# Patient Record
Sex: Male | Born: 1940 | Race: White | Hispanic: No | Marital: Married | Smoking: Former smoker
Health system: Southern US, Community
[De-identification: ages and names within clinical notes are randomized; demographics above are authoritative.]

## PROBLEM LIST (undated history)

## (undated) DIAGNOSIS — I739 Peripheral vascular disease, unspecified: Secondary | ICD-10-CM

## (undated) DIAGNOSIS — I10 Essential (primary) hypertension: Secondary | ICD-10-CM

## (undated) DIAGNOSIS — N183 Chronic kidney disease, stage 3 unspecified: Secondary | ICD-10-CM

## (undated) DIAGNOSIS — I7409 Other arterial embolism and thrombosis of abdominal aorta: Secondary | ICD-10-CM

## (undated) DIAGNOSIS — D649 Anemia, unspecified: Secondary | ICD-10-CM

## (undated) DIAGNOSIS — Z720 Tobacco use: Secondary | ICD-10-CM

## (undated) DIAGNOSIS — I119 Hypertensive heart disease without heart failure: Secondary | ICD-10-CM

## (undated) DIAGNOSIS — Z9119 Patient's noncompliance with other medical treatment and regimen: Secondary | ICD-10-CM

## (undated) DIAGNOSIS — I340 Nonrheumatic mitral (valve) insufficiency: Secondary | ICD-10-CM

## (undated) DIAGNOSIS — I639 Cerebral infarction, unspecified: Secondary | ICD-10-CM

## (undated) DIAGNOSIS — Z91199 Patient's noncompliance with other medical treatment and regimen due to unspecified reason: Secondary | ICD-10-CM

## (undated) DIAGNOSIS — E785 Hyperlipidemia, unspecified: Secondary | ICD-10-CM

## (undated) DIAGNOSIS — N529 Male erectile dysfunction, unspecified: Secondary | ICD-10-CM

## (undated) DIAGNOSIS — J449 Chronic obstructive pulmonary disease, unspecified: Secondary | ICD-10-CM

## (undated) DIAGNOSIS — K219 Gastro-esophageal reflux disease without esophagitis: Secondary | ICD-10-CM

## (undated) DIAGNOSIS — K922 Gastrointestinal hemorrhage, unspecified: Secondary | ICD-10-CM

## (undated) DIAGNOSIS — I5042 Chronic combined systolic (congestive) and diastolic (congestive) heart failure: Secondary | ICD-10-CM

## (undated) DIAGNOSIS — I779 Disorder of arteries and arterioles, unspecified: Secondary | ICD-10-CM

## (undated) DIAGNOSIS — I4892 Unspecified atrial flutter: Secondary | ICD-10-CM

## (undated) DIAGNOSIS — I2510 Atherosclerotic heart disease of native coronary artery without angina pectoris: Secondary | ICD-10-CM

## (undated) DIAGNOSIS — M109 Gout, unspecified: Secondary | ICD-10-CM

## (undated) DIAGNOSIS — I2699 Other pulmonary embolism without acute cor pulmonale: Secondary | ICD-10-CM

## (undated) DIAGNOSIS — I480 Paroxysmal atrial fibrillation: Secondary | ICD-10-CM

## (undated) HISTORY — DX: Other arterial embolism and thrombosis of abdominal aorta: I74.09

## (undated) HISTORY — DX: Male erectile dysfunction, unspecified: N52.9

## (undated) HISTORY — PX: PELVIC FRACTURE SURGERY: SHX119

## (undated) HISTORY — DX: Tobacco use: Z72.0

## (undated) HISTORY — DX: Peripheral vascular disease, unspecified: I73.9

## (undated) HISTORY — DX: Cerebral infarction, unspecified: I63.9

## (undated) HISTORY — PX: EYE SURGERY: SHX253

## (undated) HISTORY — DX: Anemia, unspecified: D64.9

## (undated) HISTORY — PX: SPINAL FUSION: SHX223

## (undated) HISTORY — DX: Paroxysmal atrial fibrillation: I48.0

## (undated) HISTORY — DX: Unspecified atrial flutter: I48.92

## (undated) HISTORY — DX: Essential (primary) hypertension: I10

## (undated) HISTORY — DX: Patient's noncompliance with other medical treatment and regimen: Z91.19

## (undated) HISTORY — PX: HAND SURGERY: SHX662

## (undated) HISTORY — DX: Disorder of arteries and arterioles, unspecified: I77.9

## (undated) HISTORY — DX: Patient's noncompliance with other medical treatment and regimen due to unspecified reason: Z91.199

## (undated) HISTORY — DX: Nonrheumatic mitral (valve) insufficiency: I34.0

## (undated) HISTORY — DX: Chronic combined systolic (congestive) and diastolic (congestive) heart failure: I50.42

## (undated) HISTORY — DX: Hyperlipidemia, unspecified: E78.5

## (undated) HISTORY — DX: Gastrointestinal hemorrhage, unspecified: K92.2

## (undated) HISTORY — DX: Chronic kidney disease, stage 3 (moderate): N18.3

## (undated) HISTORY — DX: Atherosclerotic heart disease of native coronary artery without angina pectoris: I25.10

## (undated) HISTORY — DX: Chronic obstructive pulmonary disease, unspecified: J44.9

## (undated) HISTORY — DX: Hypertensive heart disease without heart failure: I11.9

## (undated) HISTORY — DX: Chronic kidney disease, stage 3 unspecified: N18.30

---

## 1973-05-12 HISTORY — PX: OTHER SURGICAL HISTORY: SHX169

## 1999-01-17 ENCOUNTER — Encounter: Payer: Self-pay | Admitting: Interventional Cardiology

## 1999-01-17 ENCOUNTER — Inpatient Hospital Stay (HOSPITAL_COMMUNITY): Admission: EM | Admit: 1999-01-17 | Discharge: 1999-01-19 | Payer: Self-pay | Admitting: *Deleted

## 1999-02-05 ENCOUNTER — Encounter (HOSPITAL_COMMUNITY): Admission: RE | Admit: 1999-02-05 | Discharge: 1999-05-06 | Payer: Self-pay | Admitting: Interventional Cardiology

## 1999-03-19 ENCOUNTER — Ambulatory Visit (HOSPITAL_COMMUNITY): Admission: RE | Admit: 1999-03-19 | Discharge: 1999-03-19 | Payer: Self-pay | Admitting: *Deleted

## 1999-03-19 ENCOUNTER — Encounter (INDEPENDENT_AMBULATORY_CARE_PROVIDER_SITE_OTHER): Payer: Self-pay | Admitting: Specialist

## 1999-11-23 ENCOUNTER — Inpatient Hospital Stay (HOSPITAL_COMMUNITY): Admission: EM | Admit: 1999-11-23 | Discharge: 1999-11-24 | Payer: Self-pay | Admitting: Emergency Medicine

## 1999-11-23 ENCOUNTER — Encounter: Payer: Self-pay | Admitting: Interventional Cardiology

## 2000-02-18 ENCOUNTER — Ambulatory Visit (HOSPITAL_COMMUNITY): Admission: RE | Admit: 2000-02-18 | Discharge: 2000-02-18 | Payer: Self-pay | Admitting: Family Medicine

## 2000-12-08 ENCOUNTER — Encounter: Payer: Self-pay | Admitting: Family Medicine

## 2000-12-08 ENCOUNTER — Encounter: Admission: RE | Admit: 2000-12-08 | Discharge: 2000-12-08 | Payer: Self-pay | Admitting: Family Medicine

## 2003-04-13 ENCOUNTER — Ambulatory Visit (HOSPITAL_COMMUNITY): Admission: RE | Admit: 2003-04-13 | Discharge: 2003-04-13 | Payer: Self-pay | Admitting: Interventional Cardiology

## 2003-10-30 ENCOUNTER — Ambulatory Visit (HOSPITAL_COMMUNITY): Admission: RE | Admit: 2003-10-30 | Discharge: 2003-10-30 | Payer: Self-pay | Admitting: Orthopedic Surgery

## 2005-09-17 ENCOUNTER — Ambulatory Visit (HOSPITAL_COMMUNITY): Admission: RE | Admit: 2005-09-17 | Discharge: 2005-09-17 | Payer: Self-pay | Admitting: Interventional Cardiology

## 2005-09-29 ENCOUNTER — Ambulatory Visit (HOSPITAL_COMMUNITY): Admission: RE | Admit: 2005-09-29 | Discharge: 2005-09-30 | Payer: Self-pay | Admitting: Interventional Cardiology

## 2009-08-23 ENCOUNTER — Ambulatory Visit (HOSPITAL_COMMUNITY): Admission: RE | Admit: 2009-08-23 | Discharge: 2009-08-23 | Payer: Self-pay | Admitting: Interventional Cardiology

## 2010-09-27 NOTE — Procedures (Signed)
NAMEAUGUSTO, DECKMAN NO.:  [id number]   MEDICAL RECORD NO.:  [id number]          PATIENT TYPE:  OIB   LOCATION:  2899                         FACILITY:  [facility]   PHYSICIAN:  Corky Crafts, MDDATE OF BIRTH:  Jan 27, 1941   DATE OF PROCEDURE:  08/23/2009  DATE OF DISCHARGE:  08/23/2009                    PERIPHERAL VASCULAR INVASIVE PROCEDURE   PRIMARY CARDIOLOGIST:  Dr. Verdis Prime.   PROCEDURES PERFORMED:  Abdominal aortogram, pelvic angiogram, bilateral  selective renal angiogram.   OPERATOR:  Dr. Eldridge Dace.   INDICATIONS:  Claudication.   PROCEDURE NARRATIVE:  The risks and benefits of peripheral vascular  angiography were explained to the patient and informed consent was  obtained.  He was brought to the catheterization laboratory.  He was  prepped and draped in the usual sterile fashion.  His right groin was  infiltrated with 1% lidocaine.  A 5-French sheath was placed in theright femoral artery using a modified Seldinger technique.  A pigtail  catheter was advanced into the abdominal aorta and a power injection of  contrast was performed in the AP projection.  The catheter was then  pulled back to the aortoiliac bifurcation and power injection of  contrast was performed to image the pelvic vasculature.  A bilateral  lower extremity runoff was performed in a bolus-chase fashion using a  pigtail catheter.  The left SFA was not well-seen on this picture.  A  crossover catheter was used to get a Wholey wire into the left iliac  system.  A 5-French end-hole catheter was then advanced into the right  external iliac artery.  Power injection of contrast was performed  through the catheter in the iliac to image the left lower extremity.  The sheath was removed using manual compression.   FINDINGS:  The abdominal aorta had mild atherosclerosis.  There was a  small infrarenal aneurysm.  The right common iliac stent was widely  patent.  The right internal  iliac artery was occluded, this vessel  filled by collaterals from the inferior mesenteric artery.  The right  external iliac artery was patent.  The left common/external iliac stents  were patent.  The left internal iliac artery was occluded with  collaterals from the inferior mesenteric artery.  The left external  iliac artery was patent.   Right leg:  The right common femoral artery, common profunda femoral  artery were both patent.  The right SFA had mild atherosclerosis.  There  was a moderate focal area of atherosclerosis of the mid SFA, but this  did not appear to be hemodynamically significant.  The right popliteal  artery was patent below the knee, the right anterior tibial artery was  patent.  The right tibial peroneal trunk had an 80% stenosis, the right  peroneal artery had a 95% long stenosis, the right posterior tibial  artery was a small vessel, but widely patent.   Left lower extremity:  The left common femoral artery was widely patent.  The left SFA was occluded just after the origin.  The left profunda  femoral artery was widely patent.  The SFA reconstitutes in the mid SFA  from collaterals from  the profunda femoral artery.  The left popliteal  artery is widely patent.  The left anterior tibial artery is patent.  The left tibial peroneal trunk had a 30% distal stenosis.  The left  peroneal and PT arteries are widely patent.   Bilateral selective renal angiography:  A JR-4 catheter was used.  The  left kidney was supplied by a single left renal artery, which was widely  patent.  The right kidney was supplied by a single right renal artery  which had a focal 40% to 50% stenosis, which appeared mildly hazy.   The selective shot to the left SFA showed where the left SFA  reconstitutes in the mid vessel.   IMPRESSION:  1. Occluded left superficial femoral artery just past the origin.      This vessel reconstitutes in the mid superficial femoral artery      with  collaterals from the profunda femoral artery.  2. Significant disease in the right tibial peroneal trunk and right      peroneal artery.  3. Three-vessel runoff below the left knee.   RECOMMENDATIONS:  We will continue to intensify medical therapy given  that this patient does not have rest pain or any lower extremity ulcers.  I would encourage walking to, hopefully, aid in building collateral  circulation.  If his symptoms become refractory to medical therapy, we  can reconsider bringing him back for attempted angioplasty.  Another  option would be to consider bypass in that left thigh.  Findings were  discussed with the patient.  He should also stop smoking and continue  aggressive secondary prevention including blood pressure control, lipid-  lowering therapy.      Corky Crafts, MD     JSV/MEDQ  D:  09/03/2009  T:  09/03/2009  Job:  [id number]   Electronically Signed by Lance Muss MD on 09/14/2009 10:35:06 AM

## 2010-09-27 NOTE — H&P (Signed)
[place]. [facility]  Patient:    Nathan [name], Nathan [name]                      MRN: [id number] Adm. Date:  11/23/99 Attending:  Peter M. Swazil[place], M.D. CC:         Darci Needle, M.D.             Roosvelt Harps, M.D.             Jethro Bastos, M.D.                         History and Physical  CHIEF COMPLAINT:  Palpitations.  HISTORY OF PRESENT ILLNESS:  Mr. Corsino is a 70 year old white male with history of coronary artery disease, prior myocardial infarctions, tobacco and alcohol abuse, who presents with acute onset of palpitations and heart pounding this afternoon.  It is associated with a sharp left precordial pain that was described as mild to moderate and was felt to be different than his heart attack pain.  He states he had had some similar palpitations three to four weeks ago.  In the emergency room, he was found to be in atrial fibrillation with rapid ventricular response and rate of 160.  He denied any shortness of breath and continues to smoke 1-1/2 packs per day and drinks alcohol eight beers per day.  He denies any recent binging.  Patient had a subacute inferior myocardial infarction in September 2000.  Cardiac catheterization showed occlusion of the small distal left circumflex.  He was treated medically and apparently had a follow-up stress test earlier this year with Dr. Katrinka Blazing.  Ejection fraction by catheterization was 50%.  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 2. Hypercholesterolemia. 3. Hypertension. 4. Tobacco abuse. 5. Alcohol abuse. 6. Lymphocytic colitis. 7. Previous hand surgery. 8. Previous lumbar fusion.  ALLERGIES:  No known drug allergies.  MEDICATIONS: 1. Prednisone 10 mg every three days. 2. Toprol XL 50 mg per day. 3. Asacol 400 mg two tablets t.i.d. after meals. 4. Diazepam 2 mg p.r.n. 5. Prevacid 30 mg per day. 6. Lipitor 10 mg per day.  SOCIAL HISTORY:  Patient is married.  He smokes  1-1/2 packs per day and drinks eight beers per day.  FAMILY HISTORY:  Negative for coronary disease.  Positive for stroke and cancer.  REVIEW OF SYSTEMS:  Positive for right leg claudication otherwise as per HPI.  PHYSICAL EXAMINATION:  GENERAL:  Patient is a well-developed white male in no distress.  VITAL SIGNS:  Pulse is 150 and irregular.  Blood pressure is 140/72. Respirations are 20.  He is afebrile.  HEENT:  Unremarkable.  Pupils equal, round, and reactive.  Conjunctivae are clear.  Funduscopic exam is benign.  Oropharynx is clear.  NECK:  Supple, without JVD, adenopathy, thyromegaly or bruits.  LUNGS:  Clear to auscultation and percussion.  CARDIAC:  Regular rate and rhythm, normal S1 and S2, without gallops, murmurs, or clicks.  ABDOMEN:  Soft and nontender.  There is no hepatosplenomegaly, masses or bruits.  EXTREMITIES:  Femoral and pedal pulses are palpable.  He has no edema.  NEUROLOGIC:  Intact.  LABORATORY DATA:  CHEST X-RAY:  No active disease.  Sodium is 140, potassium 3.5, chloride 107, CO2 of 23, BUN 15, creatinine 1.0, glucose of 98.  Hemoglobin 14.  ECG:  Atrial fibrillation with rapid ventricular response.  There is old inferior myocardial infarction.  There  is mild ST depression anteriorly.  IMPRESSION: 1. New onset atrial fibrillation with rapid ventricular response. 2. Atherosclerotic coronary artery disease with prior inferior myocardial    infarction due to distal circumflex occlusion. 3. Hypertension. 4. Hypercholesterolemia. 5. Lymphocytic colitis. 6. Alcohol abuse. 7. Tobacco abuse. 8. Hypokalemia.  PLAN:  We will admit to telemetry, rule out myocardial infarction, replete his potassium, and we will add IV Cardizem for rate control and IV heparin, continue his other cardiac medications, obtain an echocardiogram on Monday, obtain routine lab work including thyroid studies. DD:  11/23/99 TD:  11/23/99 Job: [id number]

## 2010-09-27 NOTE — Op Note (Signed)
NAME:  ARLO, BUTT NO.:  [id number]   MEDICAL RECORD NO.:  [id number]          PATIENT TYPE:  OIB   LOCATION:  6529                         FACILITY:  [facility]   PHYSICIAN:  Corky Crafts, MDDATE OF BIRTH:  05-07-1941   DATE OF PROCEDURE:  09/29/2005  DATE OF DISCHARGE:  09/30/2005                                 OPERATIVE REPORT   REFERRING PHYSICIAN:  Verdis Prime, MD   PROCEDURES PERFORMED:  1.  Abdominal aortogram.  2.  Pelvic angiogram.  3.  Bilateral lower extremity runoffs.  4.  Bilateral iliac percutaneous transluminal angioplasty/stent placement.   INDICATIONS:  Claudication.   OPERATOR:  Corky Crafts, MD   PROCEDURAL NARRATIVE:  The risks and benefits of peripheral angiography were  explained to the patient and informed consent was obtained.  The patient was  brought to the peripheral vascular lab and placed on the table.  He was  prepped and draped in the usual sterile fashion.  His right groin was  infiltrated with 1% lidocaine.  A 5-French arterial sheath was placed into  the right femoral artery using the modified Seldinger technique.  Initially,  a Wholey wire was used to gain access; however, it could not cross the  lesion in the right iliac.  A Glidewire was then used to cross the lesion in  the iliac.  A pigtail catheter was advanced to the abdominal aorta and a  power injection of contrast was used to perform the abdominal aortogram.  The catheter was pulled back to the aortoiliac bifurcation and a power  injection of contrast was used.  Then bilateral lower extremity runoffs were  performed using a power injection of contrast through the pigtail catheter.  The PTA/stents were placed, please see below for those details.  Heparin was  used for anticoagulation during the procedure.  The sheath will be removed  using manual compression.   FINDINGS:  1.  There is an abdominal aortic aneurysm below the renal arteries which was     2.3 cm in diameter.  2.  Widely patent renal arteries.  3.  Diffusely diseased right common iliac, which was to 90% in the distal      portion.  4.  Eighty percent left distal common iliac lesion.  5.  Internal iliacs occluded bilaterally.  This territory is collateralized      by the inferior mesenteric artery.  6.  Widely patent right and left common femoral arteries.  7.  The right SFA has luminal irregularities up to 40%.  The left SFA is      diffusely diseased with up to 90% stenosis.  There is some collateral      flow from the left profunda.  8.  There is a 90% lesion in the right TP trunk.  There is three-vessel      runoff in the left leg.  There is three-vessel runoff with mild diffuse      disease.   PERCUTANEOUS TRANSLUMINAL ANGIOPLASTY NARRATIVE:  After diagnostic  angiography was performed, the 5-French sheath was removed and a 7-French  sheath was then advanced into  the right femoral artery.  A 5 0 x 20-mm  Powerflex balloon was then advanced to the lesion in the right distal common  iliac.  The balloon was inflated to 10 atmospheres for 21 seconds.  This  angioplasty allowed the sheath to be advanced to the left common iliac.  The  5.0 x 20-mm Powerflex Plus balloon was then inflated to 10 atmospheres for  16 seconds after it was placed across the lesion.  The lesion in the left  common iliac had a residual dissection after the initial angioplasty.  A 9.0  x 60-mm Smart stent was then deployed.  This stent was postdilated with a  7.0 x 40-mm Powerflex balloon which was inflated to 8 atmospheres for 21  seconds.  The distal portion of the stent was inflated to 3 atmospheres for  10 seconds.  There was an excellent angiographic result.  The sheath was  then withdrawn into the right internal iliac.  Angiography revealed mobile  atheroma noted in the proximal common iliac artery.  An 8.0 x 60-mm Smart  stent was then deployed in the right iliac, covering both the lesion  which  had been angioplastied previously as well as the mobile atheroma noted in  the proximal common iliac.  The stent was then postdilated with a 7.0 x 40-  mm balloon, which was initially inflated to 8 atmospheres for 17 seconds.  The distal portion of the stent was then postdilated with balloon inflation  of 6 atmospheres for 16 seconds.   IMPRESSIONS:  1.  Successful bilateral iliac stent placement with self-expanding stents in      both common iliac arteries.  2.  Significant left superficial femoral artery disease.  3.  Small infrarenal abdominal aortic aneurysm.   RECOMMENDATIONS:  1.  The patient will be observed overnight.  He will continue aspirin 325 mg      daily and Plavix 75 mg daily for at least a month.  2.  I will continue to encourage him to exercise as well as stop smoking.      The patient will follow up with Dr. Katrinka Blazing.      Corky Crafts, MD  Electronically Signed     JSV/MEDQ  D:  09/29/2005  T:  09/30/2005  Job:  [id number]

## 2011-07-11 ENCOUNTER — Other Ambulatory Visit: Payer: Self-pay | Admitting: Interventional Cardiology

## 2011-07-11 DIAGNOSIS — I739 Peripheral vascular disease, unspecified: Secondary | ICD-10-CM

## 2011-07-15 ENCOUNTER — Ambulatory Visit
Admission: RE | Admit: 2011-07-15 | Discharge: 2011-07-15 | Disposition: A | Discharge status: Home / Self Care | Payer: Medicare PPO | Source: Ambulatory Visit | Attending: Interventional Cardiology | Admitting: Interventional Cardiology

## 2011-07-15 DIAGNOSIS — I739 Peripheral vascular disease, unspecified: Secondary | ICD-10-CM

## 2011-07-15 MED ORDER — IOHEXOL 350 MG/ML SOLN
100.0000 mL | Freq: Once | INTRAVENOUS | Status: AC | PRN
  Administered 2011-07-15: 100 mL via INTRAVENOUS

## 2011-09-01 ENCOUNTER — Encounter: Payer: Medicare PPO | Admitting: Surgery

## 2011-09-03 ENCOUNTER — Other Ambulatory Visit: Payer: Self-pay | Admitting: Interventional Cardiology

## 2011-09-03 ENCOUNTER — Encounter (HOSPITAL_COMMUNITY): Payer: Self-pay | Admitting: Pharmacy Technician

## 2011-09-03 ENCOUNTER — Other Ambulatory Visit: Payer: Self-pay | Admitting: Cardiology

## 2011-09-03 NOTE — H&P (Signed)
PRE CATH WORK UP/RADIAL/HS/794.30/LABS/CHEST XRAY/SEE LINDA. 2. Patient will call back with his list of medications, his wife handles them for him.Marland Kitchen      HPI:     General:           Nathan [name] is a 71 yo male followed by Dr Katrinka Blazing and Dr Eldridge Dace with a hx of CAD, PVD with stents to iliac in 2007, and Hypertension. He was recently seen to due his PAD with CTA of the aortia and runoff that was abnormal and referred to Dr Myra Gianotti with appointment 09/08/11, to consider fem popliteal bypass surgery. He has chronic occulsion of the left SFA and patent iliac stents.          However he had nuclear stress test 3/13 wt the [place] with noted abnormality with small reversible defect of the apex and anterior wall and large fixed defect of the RCA and lateral wall with global hypokinesis. Dr Katrinka Blazing wants to proceed with radial cath at main lab 09/04/11 prior to PAD surgerical clearance. He states he has rare and limited chest pain, last occurred 2 months ago. Patient will have wife call back in am and confirm that med list is correct, since she fixes pill boxes..         Patient denies chest pain, palpitations, dizziness, syncope, swelling, nor PND. He has SOB with walking, had mild SOB with walking in office. + claudication with walking, resolves with rest then able to walk further, no rest pain,.     ROS:      as noted in HPI, no recent cough, congestion nor wheezing. no back problems, no GI complanints, no fever, chills, smokes 1- 1 1/2 ppd > 62 yrs. he [facility] but was unable to sleep and breathing test. no headache, back pain or problems lying flat.     Medical History: Bilateral iliac disease with stent implantations 2007, Coronary artery disease with the loop to the circumflex 2000, inferolateral infarction., Hypertension, History of paroxysmal atrial flutter ablation, Erectile disfunction, COPD, Hyperlipidemia.      Surgical History: spinal fusion , hand surgery .      Hospitalization/Major Diagnostic Procedure: Not in the past year 10/2009-10/2010.      Family History:   Negative GI family history.     Social History:      General: Smoking: yes, 1/2-1 pk/day. no Alcohol. no Recreational drug use.      Medications: Aspirin 325 mg 325 mg tablet one tab daily, Simvastatin 10 mg Tablet 1 tablet every evening once a day---[facility], Omeprazole 20 mg tablet one tablet once a day, Budeprion SR 150 MG Tablet Extended Release 12 Hour 1 tablet Twice a day, Allopurinol 300 MG Tablet 1 tablet Once a day, Colcrys 0.6 MG Tablet as directed , Fish Oil ? dose Capsule 1 capsule with a meal twice a day, OTC stress response 2 tablets daily as directed , Nitroglycerin 0.4 MG Tablet Sublingual 1 tablet under the tongue as directed as needed, Pletal 100 MG Tablet 1 tablet 30 minutes before or 2 hours after breakfast and dinner twice a day fir pain in lgs     Allergies: N.K.D.A.     Objective:    Vitals: Wt 204, Wt change -3.6 lb, Ht 74, BMI 26.19, Pulse sitting 64, BP sitting 122/68.     Examination:     Cardiology Exam:         GENERAL APPEARANCE: pleasant, NAD, .  HEENT: normal.  CAROTID UPSTROKE: bilateral carotid bruits, R>L.  JVD: flat.  HEART: regular rate and rhythm, normal S1S2, no murmur, no rub, no gallop, or click.  LUNGS: clear to auscultation, no wheezing/rhonchi/rales.  ABDOMEN: soft, non-tender, + bowel sounds.  EXTREMITIES: no leg edema.  PERIPHERAL PULSES: decreased pedal pulses. extremities cool to touch .  NEUROLOGIC: grossly intact.  MOOD: normal.            Assessment:    Assessment:  1. Coronary atherosclerosis of native coronary artery - 414.01 (Primary)   2. Atherosclerosis of native arteries of the extremities with intermittent claudication - 440.21   3. Hypertension, essential - 401.1   4. Abnormal nuclear cardiac imaging test - 794.39, plans to proceed with cardiac cath on 09/04/11,      Plan:    1. Coronary atherosclerosis of native coronary  artery  Continue Aspirin 325 mg tablet, 325 mg, one tab, orally, daily ;  Continue Simvastatin Tablet, 10 mg, 1 tablet every evening, Orally, once a day---[facility] ;  Continue Nitroglycerin Tablet Sublingual, 0.4 MG, 1 tablet under the tongue, Sublingual, as directed as needed .    Diagnostic Imaging:EKG NSR, Redmond Baseman 09/02/2011 03:33:13 PM >       2. Atherosclerosis of native arteries of the extremities with intermittent claudication  Continue Pletal Tablet, 100 MG, 1 tablet 30 minutes before or 2 hours after breakfast and dinner, Orally, twice a day fir pain in lgs .       3. Hypertension, essential   stable.       4. Abnormal nuclear cardiac imaging test        LAB: Comp Metabolic Panel     GLUCOSE 74 40-98 - mg/dL         BUN 29 1-19 - mg/dL H        CREATININE 1.47 0.60-1.30 - mg/dl H        eGFR (NON-AFRICAN AMERICAN) 38 >60 - calc L        eGFR (AFRICAN AMERICAN) 46 >60 - calc L       SODIUM 140 136-145 - mmol/L        POTASSIUM 5.0 3.5-5.5 - mmol/L        CHLORIDE 107 98-107 - mmol/L        C02 30 22-32 - mg/dL        ANION GAP 7.7 8.2-95.6 - mmol/L        CALCIUM 9.6 8.6-10.3 - mg/dL        T PROTEIN 6.5 2.1-3.0 - g/dL        ALBUMIN 4.0 8.6-5.7 - g/dL        T.BILI 0.3 0.3-1.0 - mg/dL        ALP 66 84-696 - U/L        AST 11 0-39 - U/L        ALT 9 0-52 - U/L               Nathan [name] A 09/03/2011 08:18:03 AM > will need pre and post hydration, will review with Dr Katrinka Blazing        LAB: CBC with Diff     WBC 9.4 4.0-11.0 - K/ul        RBC 4.61 4.20-5.80 - M/uL        HGB 13.2 13.0-17.0 - g/dL        HCT 29.5 28.4-13.2 - %        MCH 28.6 27.0-33.0 - pg  MPV 9.3 7.5-10.7 - fL        MCV 90.0 80.0-94.0 - fL         MCHC 31.8 32.0-36.0 - g/dL L       RDW 16.1 09.6-04.5 - %        NRBC# 0.01 -        PLT 171 150-400 - K/uL        NEUT % 50.1 43.3-71.9 - %        NRBC% 0.10 - %        LYMPH% 22.2 16.8-43.5 - %        MONO % 5.9 4.6-12.4 - %          EOS % 21.2 Confirmed by smear review 0.0-7.8 - % H       BASO % 0.6 0.0-1.0 - %        NEUT # 4.7 1.9-7.2 - K/uL        LYMPH# 2.10 1.10-2.70 - K/uL        MONO # 0.6 0.3-0.8 - K/uL         EOS # 2.0 0.0-0.6 - K/uL H       BASO # 0.1 0.0-0.1 - K/uL               Nathan [name] A 09/03/2011 08:08:02 AM > for cath        LAB: PT and PTT ([id number]) Diagnostic Imaging:Chest PA/Lat (Ordered for 09/02/2011) no acute abnormalities, Cook,Allison 09/02/2011 05:02:45 PM > , x-ray completed. Nathan [name] A 09/03/2011 08:24:40 AM > stable for cath    Risks and benefits of cardiac catheterization have been reviewed including risk of stroke, heart attack, death, bleeding, renal impariment and arterial damage. There was ample oppurtuny to answer questions. Alternatives were discussed. Patient understands and wishes to proceed. pt is aware that Dr Katrinka Blazing is considering right radial aproach and agreeable, and also that Dr Eldridge Dace may conside rbrief angiogram of the lower extremities during procedure to confirm PAD noted by CTA.          Immunizations:       Labs:      Procedure Codes: 91478 EKG I AND R, 80053 ECL COMP METABOLIC PANEL, 85025 ECL CBC PLATELET DIFF, 29562 BLOOD COLLECTION ROUTINE VENIPUNCTURE     Preventive:           Follow Up: HS pending cath (Reason: CAD, PAD, S/P cardiac cath)        Provider: Michaell Cowing. Emelda Fear, NP

## 2011-09-04 ENCOUNTER — Ambulatory Visit (HOSPITAL_COMMUNITY)
Admission: RE | Admit: 2011-09-04 | Discharge: 2011-09-04 | Disposition: A | Discharge status: Home / Self Care | Payer: Medicare PPO | Source: Ambulatory Visit | Attending: Interventional Cardiology | Admitting: Interventional Cardiology

## 2011-09-04 ENCOUNTER — Encounter (HOSPITAL_COMMUNITY)
Admission: RE | Disposition: A | Discharge status: Home / Self Care | Payer: Self-pay | Source: Ambulatory Visit | Attending: Interventional Cardiology

## 2011-09-04 DIAGNOSIS — I2582 Chronic total occlusion of coronary artery: Secondary | ICD-10-CM | POA: Insufficient documentation

## 2011-09-04 DIAGNOSIS — R9439 Abnormal result of other cardiovascular function study: Secondary | ICD-10-CM | POA: Insufficient documentation

## 2011-09-04 DIAGNOSIS — I70219 Atherosclerosis of native arteries of extremities with intermittent claudication, unspecified extremity: Secondary | ICD-10-CM | POA: Insufficient documentation

## 2011-09-04 DIAGNOSIS — I2510 Atherosclerotic heart disease of native coronary artery without angina pectoris: Secondary | ICD-10-CM | POA: Insufficient documentation

## 2011-09-04 DIAGNOSIS — I708 Atherosclerosis of other arteries: Secondary | ICD-10-CM | POA: Insufficient documentation

## 2011-09-04 HISTORY — PX: LEFT HEART CATHETERIZATION WITH CORONARY ANGIOGRAM: SHX5451

## 2011-09-04 SURGERY — LEFT HEART CATHETERIZATION WITH CORONARY ANGIOGRAM
Anesthesia: LOCAL

## 2011-09-04 MED ORDER — SODIUM CHLORIDE 0.9 % IJ SOLN: 3.0000 mL | Freq: Two times a day (BID) | INTRAMUSCULAR | Status: DC

## 2011-09-04 MED ORDER — NITROGLYCERIN 0.2 MG/ML ON CALL CATH LAB
INTRAVENOUS | Status: AC
  Filled 2011-09-04: qty 1

## 2011-09-04 MED ORDER — MIDAZOLAM HCL 2 MG/2ML IJ SOLN
INTRAMUSCULAR | Status: AC
  Filled 2011-09-04: qty 2

## 2011-09-04 MED ORDER — SODIUM CHLORIDE 0.9 % IJ SOLN: 3.0000 mL | INTRAMUSCULAR | Status: DC | PRN

## 2011-09-04 MED ORDER — SODIUM CHLORIDE 0.9 % IV SOLN: INTRAVENOUS | Status: DC

## 2011-09-04 MED ORDER — LIDOCAINE HCL (PF) 1 % IJ SOLN
INTRAMUSCULAR | Status: AC
  Filled 2011-09-04: qty 30

## 2011-09-04 MED ORDER — HEPARIN (PORCINE) IN NACL 2-0.9 UNIT/ML-% IJ SOLN
INTRAMUSCULAR | Status: AC
  Filled 2011-09-04: qty 1000

## 2011-09-04 MED ORDER — DIAZEPAM 5 MG PO TABS
5.0000 mg | ORAL_TABLET | ORAL | Status: AC
  Administered 2011-09-04: 5 mg via ORAL
  Filled 2011-09-04: qty 1

## 2011-09-04 MED ORDER — FENTANYL CITRATE 0.05 MG/ML IJ SOLN
INTRAMUSCULAR | Status: AC
  Filled 2011-09-04: qty 2

## 2011-09-04 MED ORDER — SODIUM CHLORIDE 0.9 % IV SOLN: 250.0000 mL | INTRAVENOUS | Status: DC | PRN

## 2011-09-04 MED ORDER — ACETAMINOPHEN 325 MG PO TABS: 650.0000 mg | ORAL_TABLET | ORAL | Status: DC | PRN

## 2011-09-04 MED ORDER — ASPIRIN 81 MG PO CHEW
324.0000 mg | CHEWABLE_TABLET | ORAL | Status: AC
  Administered 2011-09-04: 324 mg via ORAL
  Filled 2011-09-04: qty 4

## 2011-09-04 MED ORDER — ONDANSETRON HCL 4 MG/2ML IJ SOLN: 4.0000 mg | Freq: Four times a day (QID) | INTRAMUSCULAR | Status: DC | PRN

## 2011-09-04 MED ORDER — SODIUM CHLORIDE 0.9 % IV SOLN
INTRAVENOUS | Status: DC
  Administered 2011-09-04: 1000 mL via INTRAVENOUS

## 2011-09-04 NOTE — Discharge Instructions (Signed)

## 2011-09-04 NOTE — Progress Notes (Signed)
UP AND WALKED AND TOLERATED WELL; RIGHT GROIN STABLE;NO BLEEDING OR HEMATOMA 

## 2011-09-04 NOTE — H&P (Signed)
The procedure, and risks including acute kidney injury, stroke, death, MI,  Bleeding, allergy, limb ischemia, among others were discussed in detail and accepted by the patient.

## 2011-09-04 NOTE — CV Procedure (Signed)
     Diagnostic Cardiac Catheterization Report  Nathan [name].  71 y.o.  male 04/02/1941  Procedure Date: 09/04/2011  Referring Physician: [facility] Primary Cardiologist:: Tiburcio Pea, III   PROCEDURE:  Left heart catheterization with selective coronary angiography, left ventriculogram.  INDICATIONS:  Nuclear study performed at the [facility] demonstrating 3 zone myocardial ischemia with a large inferior and lateral wall defect and smaller mid anterior and apical ischemic zones.  The risks, benefits, and details of the procedure were explained to the patient.  The patient verbalized understanding and wanted to proceed.  Informed written consent was obtained.  PROCEDURE TECHNIQUE:  After Xylocaine anesthesia a 5 French sheath was placed in the right femoral artery with a single anterior needle wall stick.   Coronary angiography was done using a 5 [place] A2MP  catheter.  Left ventriculography was done using a 5 [place] A2 MP catheter.    CONTRAST:  Total of 70 cc.  COMPLICATIONS:  None.    HEMODYNAMICS:  Aortic pressure was 144/68 mmHg; LV pressure was 146/19 mmHg; LVEDP 29 mmHg.  There was no gradient between the left ventricle and aorta.    ANGIOGRAPHIC DATA:   The left main coronary artery is widely patent..  The left anterior descending artery is heavy calcification in the proximal to midsegment. Within this region there is obstruction in the 60-75% range. The LAD is large and wraps around the left ventricular apex. The second diagonal which is large contains ostial 50-70% stenosis..  The left circumflex artery is totally occluded after the first obtuse marginal. Proximal to the first marginal there is a 95% stenosis. The first marginal is small to moderate in size. 2 distal marginals fill late via left to left and right-to-left collaterals..  The right coronary artery is codominant with segmental 90% stenosis proximal and mid over a long segment. This vessel gives  origin to left circumflex collaterals.Marland Kitchen  LEFT VENTRICULOGRAM:  Left ventricular angiogram was done in the 30 RAO projection and revealed normal left ventricular wall motion and systolic function with an estimated ejection fraction of 50%.   IMPRESSIONS:  1. Significant 3 vessel coronary artery disease with circumflex total occlusion, high-grade diffuse obstruction in the RCA, and borderline significant mid LAD obstruction.  2. Left ventricular ejection fraction 50-55% with moderate inferobasal hypokinesis/akinesis.  3. Nuclear myocardial perfusion study demonstrating 3 zone ischemia including the mid and apical LAD territory.    RECOMMENDATION:    1. Will have the patient seen by TCTS to determine the risk of surgical revascularization. Will also discuss with the patient percutaneous approaches including right coronary and LAD stenting. We would not attempt circumflex PCI.  2. Aggressive hydration today with discharge later this evening.Marland Kitchen

## 2011-09-04 NOTE — CV Procedure (Signed)
PROCEDURE:  L Pelvic angiogram.  Selective left lower extremity angiogram.  Selective right lower extremity angiogram.  .  INDICATIONS:  Claudication or  The risks, benefits, and details of the procedure were explained to the patient.  The patient verbalized understanding and wanted to proceed.  Informed written consent was obtained.  PROCEDURE TECHNIQUE:  Right femoral access was obtained by Dr. Katrinka Blazing for cardiac catheterization.  A pigtail catheter was advanced to the aortic bifurcation and a power injection of contrast was performed in the AP projection. We obtained access to the contralateral iliac system using an Omni flush catheter and Versa core wire.  An endhole catheter was advanced to the left external iliac artery.  A power injection of contrast was performed to the endhole catheter to image the left lower extremity vasculature.  Through the right femoral sheath, a power injection of contrast was performed to visualize the right lower extremity vasculature.   CONTRAST:  Total of 75 cc.  COMPLICATIONS:  None.       ANGIOGRAPHIC DATA:  The right common iliac artery is heavily calcified.  The stent in the right common iliac artery does appear patent.  The right internal iliac artery is occluded and fills by collaterals from the inferior mesenteric artery..  The right external iliac artery is widely patent.  The right common femoral artery is patent.  There is mild atherosclerosis.  The right profunda femoral artery is widely patent.  The right SFA has mild to moderate diffuse atherosclerosis.  In the mid to distal vessel there is a focal 40% stenosis.  The right popliteal artery is widely patent.  There is mild atherosclerosis noted in the tibial vessels.  All 3 tibial vessels are patent.  The left common iliac artery has moderate disease proximally.  The left common iliac artery stent is patent with 25% mid stent in-stent restenosis. The left internal iliac artery is occluded.  This appears  to fill by collaterals from the IMA.  The left external iliac artery is patent.  The left common femoral artery appears patent.  The left profunda femoral artery is large.  The left superficial femoral artery is occluded at the ostium.  The left SFA reconstitutes in the midportion from collaterals from the left profunda femoral artery.  The distal SFA is patent with only mild atherosclerosis.  The left popliteal artery is widely patent.  All 3 vessels are patent below the knee.  IMPRESSIONS:  1. Patent common iliac stents bilaterally.  Occluded internal iliac arteries bilaterally. 2. Occluded left SFA from the proximal portion to the midportion.  Collaterals from the left profunda femoral artery.  Three-vessel runoff below the left knee. 3. Mild to moderate diffuse disease in the right SFA.  Three-vessel runoff below the right knee.   RECOMMENDATION:  He'll need management of his coronary artery disease per Dr. Katrinka Blazing.  After this, he will followup with Dr. Myra Gianotti for further evaluation of his claudication.  I encouraged him to stop smoking.  He needs to try to continue walking to build circulation in his legs.  He feels that he has failed medical therapy and I don't think he has any to percutaneous options.  He is willing to consider left femoropopliteal bypass.  Continue aspirin and lipid-lowering therapy.

## 2011-09-05 ENCOUNTER — Institutional Professional Consult (permissible substitution) (INDEPENDENT_AMBULATORY_CARE_PROVIDER_SITE_OTHER): Payer: Medicare PPO | Admitting: Cardiothoracic Surgery

## 2011-09-05 ENCOUNTER — Encounter: Payer: Self-pay | Admitting: Cardiothoracic Surgery

## 2011-09-05 VITALS — BP 141/69 | HR 68 | Resp 18 | Ht 73.0 in | Wt 210.0 lb

## 2011-09-05 DIAGNOSIS — I739 Peripheral vascular disease, unspecified: Secondary | ICD-10-CM

## 2011-09-05 DIAGNOSIS — E785 Hyperlipidemia, unspecified: Secondary | ICD-10-CM | POA: Insufficient documentation

## 2011-09-05 DIAGNOSIS — I2510 Atherosclerotic heart disease of native coronary artery without angina pectoris: Secondary | ICD-10-CM

## 2011-09-05 DIAGNOSIS — I4892 Unspecified atrial flutter: Secondary | ICD-10-CM | POA: Insufficient documentation

## 2011-09-05 DIAGNOSIS — J449 Chronic obstructive pulmonary disease, unspecified: Secondary | ICD-10-CM | POA: Insufficient documentation

## 2011-09-05 MED ORDER — ASPIRIN 81 MG PO TBEC: 81.0000 mg | DELAYED_RELEASE_TABLET | Freq: Every day | ORAL | Status: DC

## 2011-09-05 NOTE — Patient Instructions (Signed)
STOP SMOKING Stop fish oil Stop Petal   Coronary Artery Bypass Grafting Coronary artery bypass grafting (CABG) is done to bypass or fix arteries of the heart (coronary) that have become narrow or blocked. This is usually the result of plaque built up in the walls of the vessels. The coronary arteries supply the heart with the oxygen and nutrients it needs to pump blood to your body. The heart never rests and needs constant blood flow. If an artery is partially blocked, you may have chest pain (angina). Lack of blood flow to part of the heart muscle may cause that part to die. This is what happens in a heart attack (myocardial infarction). Reasons for CABG include:  Arteries that cannot be treated with medications or other interventions (such as a heart stent).   Severe angina not responsive to other treatment.   Improving heart function.   Treating a heart attack.  Every person is unique. Be sure you understand the risks and benefits of CABG.  RISKS AND COMPLICATIONS Your surgeon will discuss these with you. Your risks will be different depending on your past and present health and other factors. It may be helpful to have a family member or advocate with you so you feel free to ask questions and get the answers you need to give an informed consent. Possible problems of CABG surgery include:  Blood loss and replacement.   Stroke.   Infection.   Surgical site pain.   Heart attack during or after.   Kidney failure.  BEFORE THE PROCEDURE  Tell your doctor about any allergies, medicines, bleeding problems and other surgeries.   Take all medicines exactly as directed. You may start new medicines and stop taking others. Do not stop medications or adjust dosages on your own. Continue taking the medications up until the time of surgery.   Perform only activities that are suggested by your caregiver.   If you are overweight, you should try to lose weight. Eat a heart-healthy diet that  is low in fat and salt.   If you smoke, quit.   Let your caregiver know if you have been on steroids (including creams or drops) for long periods of time. This is critical.   If you are diabetic discuss with your doctor whether or not you should take insulin the day of your surgery.  DAY OF SURGERY:  Plan to arrive 60 minutes before the scheduled time or as directed.   Do not eat or drink anything, including medicine, unless directed.   You will sign a written consent. You may have blood work and other tests done.   Your family will be shown where they can wait for the surgeon to talk to them when the surgery is over.  PROCEDURE Only a specially trained surgeon does a CABG assisted by a team of other health care professionals. You will be asleep and not feel any discomfort during the surgery.    Traditional method:   A cut (incision) is made down the front of the chest through the breastbone (sternum).   The sternum is spread open so the surgeon can see your heart.   You are connected to a machine that does the work of your heart and lungs and your heart stops beating.   Veins are taken from your leg(s) and used to bypass the blocked arteries of your heart. Sometimes an artery from inside your chest wall is used, either by itself or along with leg veins.   When the  bypasses are done, you are taken off the machine, and your heart is restarted and takes over again.   Alternate methods:   The heart lung machine is not used. This is called off-pump. Your heart continues to beat while the bypasses are done.   Smaller incisions are used instead of going through the middle of the chest (minimally invasive).   Intended to reduce pain and promote a faster recovery.  AFTER THE PROCEDURE  You may wake up with a tube in your throat to help your breathing. You may be connected to a breathing machine.   You will not be able to talk while the tube is in place. Try not to fight against it.  The tube will be taken out as soon as it is safe.   Even if you cannot see them, there are nurses nearby who are watching everything. You are not alone.   Your family should be prepared to see you with many tubes and wires. You will be sleepy and pale. Your family can hold your hand and speak to you, but you may have no memory of this time.  SEEK IMMEDIATE MEDICAL CARE IF:  You have severe chest pain, especially if the pain is crushing or pressure-like and spreads to the arms, back, neck, or jaw, or if you have sweating, feel sick to your stomach (nausea), or shortness of breath. THIS IS AN EMERGENCY. Do not wait to see if the pain will go away. Get medical help at once. Call your local emergency services (911 in [place] DO NOT drive yourself to the hospital.   You have an attack of chest pain lasting longer than usual, despite rest and treatment with the medications your doctor has prescribed.   You wake from sleep with chest pain.   You feel dizzy or faint.  Document Released: 02/05/2005 Document Revised: 04/17/2011 Document Reviewed: 10/13/2007 [facility] Patient Information 2012 [place], [place].Coronary Artery Bypass Grafting Care After Refer to this sheet in the next few weeks. These instructions provide you with information on caring for yourself after your procedure. Your caregiver may also give you more specific instructions. Your treatment has been planned according to current medical practices, but problems sometimes occur. Call your caregiver if you have any problems or questions after your procedure.   Recovery from open heart surgery will be different for everyone. Some people feel well after 3 or 4 weeks, while for others it takes longer. After heart surgery, it may be normal to:  Not have an appetite, feel nauseated by the smell of food, or only want to eat a small amount.   Be constipated because of changes in your diet, activity, and medicines. Eat foods high in fiber. Add fresh  fruits and vegetables to your diet. Stool softeners may be helpful.   Feel sad or unhappy. You may be frustrated or cranky. You may have good days and bad days. Do not give up. Talk to your caregiver if you do not feel better.   Feel weakness and fatigue. You many need physical therapy or cardiac rehabilitation to get your strength back.   Develop an irregular heartbeat called atrial fibrillation. Symptoms of atrial fibrillation are a fast, irregular heartbeat or feelings of fluttery heartbeats, shortness of breath, low blood pressure, and dizziness. If these symptoms develop, see your caregiver right away.  MEDICATION  Have a list of all the medicines you will be taking when you leave the hospital. For every medicine, know the following:   Name.  Exact dose.   Time of day to be taken.   How often it should be taken.   Why you are taking it.   Ask which medicines should or should not be taken together. If you take more than one heart medicine, ask if it is okay to take them together. Some heart medicines should not be taken at the same time because they may lower your blood pressure too much.   Narcotic pain medicine can cause constipation. Eat fresh fruits and vegetables. Add fiber to your diet. Stool softener medicine may help relieve constipation.   Keep a copy of your medicines with you at all times.   Do not add or stop taking any medicine until you check with your caregiver.   Medicines can have side effects. Call your caregiver who prescribed the medicine if you:   Start throwing up, have diarrhea, or have stomach pain.   Feel dizzy or lightheaded when you stand up.   Feel your heart is skipping beats or is beating too fast or too slow.   Develop a rash.   Notice unusual bruising or bleeding.  HOME CARE INSTRUCTIONS  After heart surgery, it is important to learn how to take your pulse. Have your caregiver show you how to take your pulse.   Use your incentive  spirometer. Ask your caregiver how long after surgery you need to use it.  Care of your chest incision  Tell your caregiver right away if you notice clicking in your chest (sternum).   Support your chest with a pillow or your arms when you take deep breaths and cough.   Follow your caregiver's instructions about when you can bathe or swim.   Protect your incision from sunlight during the first year to keep the scar from getting dark.   Tell your caregiver if you notice:   Increased tenderness of your incision.   Increased redness or swelling around your incision.   Drainage or pus from your incision.  Care of your leg incision(s)  Avoid crossing your legs.   Avoid sitting for long periods of time. Change positions every half hour.   Elevate your leg(s) when you are sitting.   Check your leg(s) daily for swelling. Check the incisions for redness or drainage.   Wear your elastic stockings as told by your caregiver. Take them off at bedtime.  Diet  Diet is very important to heart health.   Eat plenty of fresh fruits and vegetables. Meats should be lean cut. Avoid canned, processed, and fried foods.   Talk to a dietician. They can teach you how to make healthy food and drink choices.  Weight  Weigh yourself every day. This is important because it helps to know if you are retaining fluid that may make your heart and lungs work harder.   Use the same scale each time.   Weigh yourself every morning at the same time. You should do this after you go to the bathroom, but before you eat breakfast.   Your weight will be more accurate if you do not wear any clothes.   Record your weight.   Tell your caregiver if you have gained 2 pounds or more overnight.  Activity Stop any activity at once if you have chest pain, shortness of breath, irregular heartbeats, or dizziness. Get help right away if you have any of these symptoms.  Bathing.  Avoid soaking in a bath or hot tub until  your incisions are healed.   Rest.  You need a balance of rest and activity.   Exercise. Exercise per your caregiver's advice. You may need physical therapy or cardiac rehabilitation to help strengthen your muscles and build your endurance.   Climbing stairs. Unless your caregiver tells you not to climb stairs, go up stairs slowly and rest if you tire. Do not pull yourself up by the handrail.   Driving a car. Follow your caregiver's advice on when you may drive. You may ride as a passenger at any time. When traveling for long periods of time in a car, get out of the car and walk around for a few minutes every 2 hours.   Lifting. Avoid lifting, pushing, or pulling anything heavier than 10 pounds for 6 weeks after surgery or as told by your caregiver.   Returning to work. Check with your caregiver. People heal at different rates. Most people will be able to go back to work 6 to 12 weeks after surgery.   Sexual activity. You may resume sexual relations as told by your caregiver.  SEEK MEDICAL CARE IF:  Any of your incisions are red, painful, or have any type of drainage coming from them.   You have an oral temperature above 102 F (38.9 C).   You have ankle or leg swelling.   You have pain in your legs.   You have weight gain of 2 or more pounds a day.   You feel dizzy or lightheaded when you stand up.  SEEK IMMEDIATE MEDICAL CARE IF:  You have angina or chest pain that goes to your jaw or arms. Call your local emergency services right away.   You have shortness of breath at rest or with activity.   You have a fast or irregular heartbeat (arrhythmia).   There is a "clicking" in your sternum when you move.   You have numbness or weakness in your arms or legs.  MAKE SURE YOU:  Understand these instructions.   Will watch your condition.   Will get help right away if you are not doing well or get worse.  Document Released: 11/15/2004 Document Revised: 04/17/2011 Document  Reviewed: 07/03/2010 [facility] Patient Information 2012 [place], [place].

## 2011-09-07 ENCOUNTER — Encounter: Payer: Self-pay | Admitting: Cardiothoracic Surgery

## 2011-09-07 DIAGNOSIS — I739 Peripheral vascular disease, unspecified: Secondary | ICD-10-CM | POA: Insufficient documentation

## 2011-09-07 NOTE — Progress Notes (Signed)
[address]            [place] [address]          [phone]      Merri Brunette. [place] Medical Record #[id number] Date of Birth: [birth year]  Referring: Lesleigh Noe, MD Primary Care: No primary provider on file.  Chief Complaint:    Chief Complaint  Patient presents with  . Coronary Artery Disease    Referral from Dr Katrinka Blazing for eval on CAD, Cardiac Cath on 09/04/11     History of Present Illness:    Patient with increasing left leg claudication. He has known history of PVD with stents to the iliac in 2007. He was referred to Dr Dahlia Byes and was to see April 29. He has a history of inferior mi in the past 12 years ago. Recent  Stress at the [facility] 07/2011 report ly showed reversible defect at the apex and anterior wall, fixed defect of the RCA and lateral wall.Patient does not complain of angina, does have exertional SOB and left leg claudication that is limiting. Cardiac cath done by Dr Katrinka Blazing in preparation for possible vascular surgery.Patient is long term smoker and says today he is not interested in stopping, 10 min of the visit was spent in discussing stopping.      Current Activity/ Functional Status: Patient is independent with mobility/ambulation, transfers, ADL's, IADL's.   Past Medical History  Diagnosis Date  . Aorto-iliac disease     bilat with stent 2007  . CAD (coronary artery disease)     with loop to the circumflex 2000, inferolateral infarction  . Hypertension   . Paroxysmal atrial flutter     ablation  . ED (erectile dysfunction)   . COPD (chronic obstructive pulmonary disease)   . Hyperlipidemia     Past Surgical History  Procedure Date  . Spinal fusion   . Hand surgery   . Neck fusion     Family History: father died of "old" age at 41, Mother died of unknown cause but had history of TB, 3 sisters and one brother, brother had MI at "young" age  History   Social History  . Marital Status: Married      Spouse Name: N/A    Number of Children: N/A  . Years of Education: N/A   Occupational History  . retired    Social History Main Topics  . Smoking status: Current Everyday Smoker -- 1.0 packs/day for 60 years    Types: Cigarettes  . Smokeless tobacco: Never Used  . 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 Drug Use: No            History  Smoking status  . Current Everyday Smoker -- 1.0 packs/day for 60 years  . Types: Cigarettes  Smokeless tobacco  . Never Used    History  Alcohol Use  . 3.6 oz/week  . 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 allopurinol (ZYLOPRIM) 300 MG tablet Take 300 mg by mouth  daily.      . amLODipine (NORVASC) 10 MG tablet Take 5 mg by mouth daily.      Marland Kitchen aspirin EC 81 MG EC tablet Take 1 tablet (81 mg total) by mouth daily.  30 tablet  0  . buPROPion (WELLBUTRIN SR) 150 MG 12 hr tablet Take 150 mg by mouth 2 (two) times daily.      . colchicine 0.6 MG tablet Take 0.6 mg by mouth daily.      . isosorbide mononitrate (IMDUR) 30 MG 24 hr tablet Take 30 mg by mouth daily.      Marland Kitchen lisinopril (PRINIVIL,ZESTRIL) 20 MG tablet Take 10 mg by mouth daily.      . metoprolol (LOPRESSOR) 50 MG tablet Take 25 mg by mouth 2 (two) times daily.      . nitroGLYCERIN (NITROSTAT) 0.4 MG SL tablet Place 0.4 mg under the tongue every 5 (five) minutes as needed.      Marland Kitchen omeprazole (PRILOSEC) 20 MG capsule Take 20 mg by mouth daily.      . simvastatin (ZOCOR) 10 MG tablet Take 10 mg by mouth at bedtime.           Review of Systems:     Cardiac Review of Systems: Y or N  Chest Pain Katrina.Aver    ]  Resting SOB [ n  ] Exertional SOB  [ y ]  Orthopnea [ n ]   Pedal Edema [ y  ]    Palpitations [ n  ] Syncope  [ n ]   Presyncope [  n ]  General Review of Systems: [Y] = yes [  ]=no Constitional: recent weight change [ n ]; anorexia [n  ]; fatigue [ y ]; nausea [  ]; night sweats [n  ]; fever [n  ]; or chills [n  ];                                                                                                                                          Dental: poor dentition[y  ];   Eye : blurred vision [ n ]; diplopia [ n  ]; vision changes [ n ];  Amaurosis fugax[  ]; Resp: cough [  ];  wheezing[y  ];  hemoptysis[n  ]; shortness of breath[ y ]; paroxysmal nocturnal dyspnea[  ]; dyspnea on exertion[y  ]; or orthopnea[  ];  GI:  gallstones[  ], vomiting[  ];  dysphagia[  ]; melena[  ];  hematochezia [  ]; heartburn[  ];   Hx of  Colonoscopy[  ]; GU: kidney stones [  ]; hematuria[  ];   dysuria [  ];  nocturia[  ];  history of     obstruction [  ];             Skin: rash, swelling[  ];, hair loss[  ];  peripheral  edema[  ];  or itching[  ]; Musculosketetal: myalgias[  ];  joint swelling[  ];  joint erythema[  ];  joint pain[ y ];  back pain[  ];  Heme/Lymph: bruising[  ];  bleeding[  ];  anemia[  ];  Neuro: TIA[ n ];  headaches[  ];  stroke[  ];  vertigo[  ];  seizures[  ];   paresthesias[ n ];  difficulty walking[y  ];  Psych:depression[y  ]; anxiety[  ]; patient wife he has had change in mental status since OCT 2012 "in a fog"  Endocrine: diabetes[ n ];  thyroid dysfunction[  ];  Immunizations: Flu [ n ]; Pneumococcal[n  ];  Other:  Physical Exam: BP 141/69  Pulse 68  Resp 18  Ht 6\' 1"  (1.854 m)  Wt 210 lb (95.255 kg)  BMI 27.71 kg/m2  SpO2 97%  General appearance: alert, distracted, no distress and slowed mentation Neurologic: intact Heart: regular rate and rhythm, S1, S2 normal, no murmur, click, rub or gallop and normal apical impulse Lungs: clear to auscultation bilaterally and normal percussion bilaterally Abdomen: soft, non-tender; bowel sounds normal; no masses,  no  organomegaly Extremities: venous stasis dermatitis noted and patient has depent rubor left greater then right withou palpable pedal pulses, 2+ pedal edema bilatrerial Bilaterial carotid bruits No cervical or axillary adenopathy   Diagnostic Studies & Laboratory data:     Recent Radiology Findings:  No results found.  Cardiac Cath:  Damieon Armendariz.  71 y.o.  male  14-Apr-1941  Procedure Date: 09/04/2011  Referring Physician: [facility]  Primary Cardiologist:: Tiburcio Pea, III  PROCEDURE: Left heart catheterization with selective coronary angiography, left ventriculogram.  INDICATIONS: Nuclear study performed at the [name] demonstrating 3 zone myocardial ischemia with a large inferior and lateral wall defect and smaller mid anterior and apical ischemic zones.  The risks, benefits, and details of the procedure were explained to the patient. The patient verbalized understanding and wanted to proceed. Informed written consent was obtained.  PROCEDURE TECHNIQUE: After Xylocaine anesthesia a 5 French sheath was placed in the right femoral artery with a single anterior needle wall stick. Coronary angiography was done using a 5 [place] A2MP catheter. Left ventriculography was done using a 5 [place] A2 MP catheter.  CONTRAST: Total of 70 cc.  COMPLICATIONS: None.  HEMODYNAMICS: Aortic pressure was 144/68 mmHg; LV pressure was 146/19 mmHg; LVEDP 29 mmHg. There was no gradient between the left ventricle and aorta.  ANGIOGRAPHIC DATA: The left main coronary artery is widely patent..  The left anterior descending artery is heavy calcification in the proximal to midsegment. Within this region there is obstruction in the 60-75% range. The LAD is large and wraps around the left ventricular apex. The second diagonal which is large contains ostial 50-70% stenosis..  The left circumflex artery is totally occluded after the first obtuse marginal. Proximal to the first marginal there is a 95%  stenosis. The first marginal is small to moderate in size. 2 distal marginals fill late via left to left and right-to-left collaterals..  The right coronary artery is codominant with segmental 90% stenosis proximal and mid over a long segment. This vessel gives origin to left circumflex collaterals.Marland Kitchen  LEFT VENTRICULOGRAM: Left ventricular angiogram was done in the 30 RAO projection and revealed normal left ventricular wall motion and systolic function with an estimated ejection fraction of 50%.  IMPRESSIONS: 1. Significant 3 vessel coronary artery disease with circumflex total occlusion, high-grade diffuse obstruction  in the RCA, and borderline significant mid LAD obstruction.  2. Left ventricular ejection fraction 50-55% with moderate inferobasal hypokinesis/akinesis.  3. Nuclear myocardial perfusion study demonstrating 3 zone ischemia including the mid and apical LAD territory.  RECOMMENDATION:  1. Will have the patient seen by TCTS to determine the risk of surgical revascularization. Will also discuss with the patient percutaneous approaches including right coronary and LAD stenting. We would not attempt circumflex PCI.  2. Aggressive hydration today with discharge later this evening.Ronny Flurry: PROCEDURE: L Pelvic angiogram. Selective left lower extremity angiogram. Selective right lower extremity angiogram. .  INDICATIONS: Claudication or  The risks, benefits, and details of the procedure were explained to the patient. The patient verbalized understanding and wanted to proceed. Informed written consent was obtained.  PROCEDURE TECHNIQUE: Right femoral access was obtained by Dr. Katrinka Blazing for cardiac catheterization. A pigtail catheter was advanced to the aortic bifurcation and a power injection of contrast was performed in the AP projection. We obtained access to the contralateral iliac system using an Omni flush catheter and Versa core wire. An endhole catheter was advanced to the left external  iliac artery. A power injection of contrast was performed to the endhole catheter to image the left lower extremity vasculature. Through the right femoral sheath, a power injection of contrast was performed to visualize the right lower extremity vasculature.  CONTRAST: Total of 75 cc.  COMPLICATIONS: None.  ANGIOGRAPHIC DATA: The right common iliac artery is heavily calcified. The stent in the right common iliac artery does appear patent. The right internal iliac artery is occluded and fills by collaterals from the inferior mesenteric artery.. The right external iliac artery is widely patent. The right common femoral artery is patent. There is mild atherosclerosis. The right profunda femoral artery is widely patent. The right SFA has mild to moderate diffuse atherosclerosis. In the mid to distal vessel there is a focal 40% stenosis. The right popliteal artery is widely patent. There is mild atherosclerosis noted in the tibial vessels. All 3 tibial vessels are patent.  The left common iliac artery has moderate disease proximally. The left common iliac artery stent is patent with 25% mid stent in-stent restenosis. The left internal iliac artery is occluded. This appears to fill by collaterals from the IMA. The left external iliac artery is patent. The left common femoral artery appears patent. The left profunda femoral artery is large. The left superficial femoral artery is occluded at the ostium. The left SFA reconstitutes in the midportion from collaterals from the left profunda femoral artery. The distal SFA is patent with only mild atherosclerosis. The left popliteal artery is widely patent. All 3 vessels are patent below the knee.  IMPRESSIONS:  1. Patent common iliac stents bilaterally. Occluded internal iliac arteries bilaterally.  2. Occluded left SFA from the proximal portion to the midportion. Collaterals from the left profunda femoral artery. Three-vessel runoff below the left knee.  3. Mild to  moderate diffuse disease in the right SFA. Three-vessel runoff below the right knee.  RECOMMENDATION: He'll need management of his coronary artery disease per Dr. Katrinka Blazing. After this, he will followup with Dr. Myra Gianotti for further evaluation of his claudication. I encouraged him to stop smoking. He needs to try to continue walking to build circulation in his legs. He feels that he has failed medical therapy and I don't think he has any to percutaneous options. He is willing to consider left femoropopliteal bypass. Continue aspirin and lipid-lowering therapy.  Recent Lab Findings: No results found for this basename: WBC, HGB, HCT, PLT, GLUCOSE, CHOL, TRIG, HDL, LDLDIRECT, LDLCALC, ALT, AST, NA, K, CL, CREATININE, BUN, CO2, TSH, INR, GLUF, HGBA1C      Assessment / Plan:   1. Three vessel coronary artery occlusive disease, with postive stress test 2 COPD, long term tobacco use 3 Limiting claudication left greater then right with sever peripheral vascular disease 4 Hypertension 5 Long term alcohol heavy use 6 Six month history of mental status changes per patients wife, with bilateral carotid bruits, r/o stroke  With the patient numerous problems that increase risk of CABG will proceed with CT of brain, carotid doppler studies and PFTs before making final decision of CABG vs Angioplasty. Have spent 10 min discussing the need to stop smoking with the patient who relates he is unlikely to stop smoking.       Delight Ovens MD  Beeper [phone] Office [phone] 09/07/2011 8:33 PM

## 2011-09-08 ENCOUNTER — Other Ambulatory Visit: Payer: Self-pay | Admitting: Cardiothoracic Surgery

## 2011-09-08 ENCOUNTER — Encounter: Payer: Medicare PPO | Admitting: Surgery

## 2011-09-08 DIAGNOSIS — R413 Other amnesia: Secondary | ICD-10-CM

## 2011-09-08 DIAGNOSIS — I2510 Atherosclerotic heart disease of native coronary artery without angina pectoris: Secondary | ICD-10-CM

## 2011-09-08 DIAGNOSIS — R410 Disorientation, unspecified: Secondary | ICD-10-CM

## 2011-09-08 DIAGNOSIS — R5383 Other fatigue: Secondary | ICD-10-CM

## 2011-09-11 ENCOUNTER — Ambulatory Visit (HOSPITAL_COMMUNITY)
Admission: RE | Admit: 2011-09-11 | Discharge: 2011-09-11 | Disposition: A | Discharge status: Home / Self Care | Payer: Medicare PPO | Source: Ambulatory Visit | Attending: Cardiothoracic Surgery | Admitting: Cardiothoracic Surgery

## 2011-09-11 ENCOUNTER — Encounter (HOSPITAL_COMMUNITY): Payer: Self-pay

## 2011-09-11 ENCOUNTER — Observation Stay (HOSPITAL_COMMUNITY)
Admission: RE | Admit: 2011-09-11 | Discharge: 2011-09-11 | Disposition: A | Discharge status: Home / Self Care | Payer: Medicare PPO | Source: Ambulatory Visit | Attending: Cardiothoracic Surgery | Admitting: Cardiothoracic Surgery

## 2011-09-11 DIAGNOSIS — F29 Unspecified psychosis not due to a substance or known physiological condition: Secondary | ICD-10-CM | POA: Insufficient documentation

## 2011-09-11 DIAGNOSIS — Z01811 Encounter for preprocedural respiratory examination: Secondary | ICD-10-CM | POA: Insufficient documentation

## 2011-09-11 DIAGNOSIS — R269 Unspecified abnormalities of gait and mobility: Secondary | ICD-10-CM | POA: Insufficient documentation

## 2011-09-11 DIAGNOSIS — R5381 Other malaise: Secondary | ICD-10-CM | POA: Insufficient documentation

## 2011-09-11 DIAGNOSIS — R5383 Other fatigue: Secondary | ICD-10-CM

## 2011-09-11 DIAGNOSIS — Z01810 Encounter for preprocedural cardiovascular examination: Secondary | ICD-10-CM

## 2011-09-11 DIAGNOSIS — I2510 Atherosclerotic heart disease of native coronary artery without angina pectoris: Secondary | ICD-10-CM

## 2011-09-11 DIAGNOSIS — R413 Other amnesia: Secondary | ICD-10-CM

## 2011-09-11 DIAGNOSIS — Z8673 Personal history of transient ischemic attack (TIA), and cerebral infarction without residual deficits: Secondary | ICD-10-CM | POA: Insufficient documentation

## 2011-09-11 DIAGNOSIS — R410 Disorientation, unspecified: Secondary | ICD-10-CM

## 2011-09-11 LAB — PULMONARY FUNCTION TEST

## 2011-09-11 MED ORDER — ALBUTEROL SULFATE (5 MG/ML) 0.5% IN NEBU
2.5000 mg | INHALATION_SOLUTION | Freq: Once | RESPIRATORY_TRACT | Status: AC
  Administered 2011-09-11: 2.5 mg via RESPIRATORY_TRACT

## 2011-09-11 MED ORDER — IOHEXOL 300 MG/ML  SOLN
80.0000 mL | Freq: Once | INTRAMUSCULAR | Status: AC | PRN
  Administered 2011-09-11: 80 mL via INTRAVENOUS

## 2011-09-11 NOTE — Progress Notes (Signed)
Pre-op Cardiac Surgery  Carotid Findings:  Right distal internal carotid artery demonstrates a >80% stenosis with no visible plaque formation. Due to patient anatomy, there was limited visualization of this segment of the ICA, therefore plaque morphology cannot be excluded. No evidence of left internal carotid artery stenosis. Bilateral antegrade vertebral artery flow.    Upper Extremity Right Left  Brachial Pressures 146-Triphasic 143-Triphasic  Radial Waveforms Biphasic Triphasic  Ulnar Waveforms Triphasic Biphasic  Palmar Arch (Allen's Test) Signal is unaffected by radial compression, obliterates with ulnar compression. Signal is unaffected by radial compression, decreases >50% with ulnar compression.       Lower  Extremity Right Left  Dorsalis Pedis 126-Biphasic 101-Monophasic  Anterior Tibial    Posterior Tibial 118-Monophasic 96-Monophasic  Ankle/Brachial Indices 0.86 0.69    Findings: Right ABI=0.86, which is indicative of mild disease. Left ABI=0.69, which is indicative of moderate disease.   Elpidio Galea RDMS, RDCS

## 2011-09-12 ENCOUNTER — Encounter: Payer: Medicare PPO | Admitting: Cardiothoracic Surgery

## 2011-09-15 ENCOUNTER — Encounter: Payer: Self-pay | Admitting: Cardiothoracic Surgery

## 2011-09-15 ENCOUNTER — Ambulatory Visit (INDEPENDENT_AMBULATORY_CARE_PROVIDER_SITE_OTHER): Payer: Medicare PPO | Admitting: Cardiothoracic Surgery

## 2011-09-15 VITALS — BP 148/62 | HR 82 | Resp 20 | Ht 73.0 in | Wt 204.0 lb

## 2011-09-15 DIAGNOSIS — I2510 Atherosclerotic heart disease of native coronary artery without angina pectoris: Secondary | ICD-10-CM

## 2011-09-15 DIAGNOSIS — I739 Peripheral vascular disease, unspecified: Secondary | ICD-10-CM

## 2011-09-15 NOTE — Progress Notes (Signed)
[address]            [place] [address]          [phone]       NOWELL SITES [facility] Medical Record #[id number] Date of Birth: 1941-01-27  Referring: Lesleigh Noe, MD Primary Care: No primary provider on file.  Chief Complaint:    Chief Complaint  Patient presents with  . Follow-up    Further discuss surgery     History of Present Illness:    Patient with increasing left leg claudication. He has known history of PVD with stents to the iliac in 2007. He was referred to Dr Dahlia Byes and was to see April 29. He has a history of inferior mi in the past 12 years ago. Recent  Stress at the [facility] 07/2011 report ly showed reversible defect at the apex and anterior wall, fixed defect of the RCA and lateral wall.Patient does not complain of angina, does have exertional SOB and left leg claudication that is limiting. Cardiac cath done by Dr Katrinka Blazing in preparation for possible vascular surgery.Patient is long term smoker and says today he is not interested in stopping, 10 min of the visit was spent in discussing stopping.   Since seeing last week the patient has had no chest pain, he continues to smoke. He notes that he has no intention of stopping.   Current Activity/ Functional Status: Patient is independent with mobility/ambulation, transfers, ADL's, IADL's.   Past Medical History  Diagnosis Date  . Aorto-iliac disease     bilat with stent 2007  . CAD (coronary artery disease)     with loop to the circumflex 2000, inferolateral infarction  . Hypertension   . Paroxysmal atrial flutter     ablation  . ED (erectile dysfunction)   . COPD (chronic obstructive pulmonary disease)   . Hyperlipidemia     Past Surgical History  Procedure Date  . Spinal fusion   . Hand surgery   . Neck fusion     Family History: father died of "old" age at 88, Mother died of unknown cause but had history of TB, 3 sisters and one brother,  brother had MI at "young" age  History   Social History  . Marital Status: Married    Spouse Name: N/A    Number of Children: N/A  . Years of Education: N/A   Occupational History  . retired    Social History Main Topics  . Smoking status: Current Everyday Smoker -- 1.0 packs/day for 60 years    Types: Cigarettes  . Smokeless tobacco: Never Used  . 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 Drug Use: No            History  Smoking status  . Current Everyday Smoker -- 1.0 packs/day for 60 years  . Types: Cigarettes  Smokeless tobacco  . Never Used    History  Alcohol Use  . 3.6 oz/week  . 6 Shots of liquor per week    previous history of heavy alcohol use 12 pack of beer /day  now 4-5 drinks per week  No Known Allergies  Current Outpatient Prescriptions  Medication Sig Dispense Refill  . allopurinol (ZYLOPRIM) 300 MG tablet Take 300 mg by mouth daily.      Marland Kitchen amLODipine (NORVASC) 10 MG tablet Take 5 mg by mouth daily.      Marland Kitchen aspirin EC 81 MG EC tablet Take 1 tablet (81 mg total) by mouth daily.  30 tablet  0  . buPROPion (WELLBUTRIN SR) 150 MG 12 hr tablet Take 150 mg by mouth 2 (two) times daily.      . isosorbide mononitrate (IMDUR) 30 MG 24 hr tablet Take 30 mg by mouth daily.      Marland Kitchen lisinopril (PRINIVIL,ZESTRIL) 20 MG tablet Take 10 mg by mouth daily.      . metoprolol (LOPRESSOR) 50 MG tablet Take 25 mg by mouth 2 (two) times daily.      . nitroGLYCERIN (NITROSTAT) 0.4 MG SL tablet Place 0.4 mg under the tongue every 5 (five) minutes as needed.      Marland Kitchen omeprazole (PRILOSEC) 20 MG capsule Take 20 mg by mouth daily.      . simvastatin (ZOCOR) 10 MG tablet Take 10 mg by mouth at bedtime.           Review of Systems:     Cardiac Review of Systems: Y or N  Chest Pain Katrina.Aver    ]  Resting SOB [ n  ] Exertional SOB  [ y ]  Orthopnea [ n ]   Pedal Edema [  y  ]    Palpitations [ n ] Syncope  [ n ]   Presyncope [  n ]  General Review of Systems: [Y] = yes [  ]=no Constitional: recent weight change [ n ]; anorexia [n  ]; fatigue [ y ]; nausea [  ]; night sweats [n  ]; fever [n  ]; or chills [n  ];                                                                                                                                          Dental: poor dentition[y  ];   Eye : blurred vision [ n ]; diplopia [ n  ]; vision changes [ n ];  Amaurosis fugax[  ]; Resp: cough [  ];  wheezing[y  ];  hemoptysis[n  ]; shortness of breath[ y ]; paroxysmal nocturnal dyspnea[  ]; dyspnea on exertion[y  ]; or orthopnea[  ];  GI:  gallstones[  ], vomiting[  ];  dysphagia[  ]; melena[  ];  hematochezia [  ]; heartburn[  ];   Hx of  Colonoscopy[  ]; GU: kidney stones [  ]; hematuria[  ];   dysuria [  ];  nocturia[  ];  history of     obstruction [  ];             Skin: rash, swelling[  ];,  hair loss[  ];  peripheral edema[  ];  or itching[  ]; Musculosketetal: myalgias[  ];  joint swelling[  ];  joint erythema[  ];  joint pain[ y ];  back pain[  ];  Heme/Lymph: bruising[  ];  bleeding[  ];  anemia[  ];  Neuro: TIA[ n ];  headaches[  ];  stroke[  ];  vertigo[  ];  seizures[  ];   paresthesias[ n ];  difficulty walking[y  ];  Psych:depression[y  ]; anxiety[  ]; patient wife he has had change in mental status since OCT 2012 "in a fog"  Endocrine: diabetes[ n ];  thyroid dysfunction[  ];  Immunizations: Flu [ n ]; Pneumococcal[n  ];  Other:  Physical Exam: BP 148/62  Pulse 82  Resp 20  Ht 6\' 1"  (1.854 m)  Wt 204 lb (92.534 kg)  BMI 26.91 kg/m2  SpO2 96%  General appearance: alert, distracted, no distress and slowed mentation Neurologic: intact Heart: regular rate and rhythm, S1, S2 normal, no murmur, click, rub or gallop and normal apical impulse Lungs: clear to auscultation bilaterally and normal percussion bilaterally Abdomen: soft, non-tender; bowel sounds normal;  no masses,  no organomegaly Extremities: venous stasis dermatitis noted and patient has depent rubor left greater then right withou palpable pedal pulses, 2+ pedal edema bilatrerial Bilaterial carotid bruits No cervical or axillary adenopathy   Diagnostic Studies & Laboratory data:     Recent Radiology Findings:  Ct Head W Wo Contrast  09/11/2011  *RADIOLOGY REPORT*  Clinical Data: Memory loss.  Confusion.  Lethargy.  Unsteady gait. Coronary artery disease.  CT HEAD WITHOUT AND WITH CONTRAST  Technique:  Contiguous axial images were obtained from the base of the skull through the vertex without and with intravenous contrast.  Contrast: 80mL OMNIPAQUE IOHEXOL 300 MG/ML  SOLN  Comparison: None.  Findings: Multiple lacunar infarcts are present within the basal ganglia.  Some of the views are clearly chronic.  There is a lesion in the left caudate head and another in the right thalamus which are age indeterminate.  Mild periventricular subcortical white matter hypoattenuation is present otherwise.  The postcontrast images demonstrate to a no pathologic enhancement. The ventricles are of normal size.  No significant extra-axial fluid collection is present.  The paranasal sinuses and mastoid air cells are clear.  IMPRESSION:  1.  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  Although several are clearly remote, others are age indeterminate. MRI is more sensitive and specific for the evaluation of acute ischemia if the patient can tolerate an MRI. 2.  Mild generalized white matter disease likely reflects the sequelae of chronic microvascular ischemia.  Original Report Authenticated By: Jamesetta Orleans. MATTERN, M.D.   Cardiac Cath:  Aleister Lady.  71 y.o.  male  [birth year]  Procedure Date: 09/04/2011  Referring Physician: [facility]  Primary Cardiologist:: Tiburcio Pea, III  PROCEDURE: Left heart catheterization with selective coronary angiography, left ventriculogram.  INDICATIONS: Nuclear study  performed at the [facility] demonstrating 3 zone myocardial ischemia with a large inferior and lateral wall defect and smaller mid anterior and apical ischemic zones.  The risks, benefits, and details of the procedure were explained to the patient. The patient verbalized understanding and wanted to proceed. Informed written consent was obtained.  PROCEDURE TECHNIQUE: After Xylocaine anesthesia a 5 French sheath was placed in the right femoral artery with a single anterior needle wall stick. Coronary angiography was done using a 5 [place] A2MP  catheter. Left ventriculography was done using a 5 [place] A2 MP catheter.  CONTRAST: Total of 70 cc.  COMPLICATIONS: None.  HEMODYNAMICS: Aortic pressure was 144/68 mmHg; LV pressure was 146/19 mmHg; LVEDP 29 mmHg. There was no gradient between the left ventricle and aorta.  ANGIOGRAPHIC DATA: The left main coronary artery is widely patent..  The left anterior descending artery is heavy calcification in the proximal to midsegment. Within this region there is obstruction in the 60-75% range. The LAD is large and wraps around the left ventricular apex. The second diagonal which is large contains ostial 50-70% stenosis..  The left circumflex artery is totally occluded after the first obtuse marginal. Proximal to the first marginal there is a 95% stenosis. The first marginal is small to moderate in size. 2 distal marginals fill late via left to left and right-to-left collaterals..  The right coronary artery is codominant with segmental 90% stenosis proximal and mid over a long segment. This vessel gives origin to left circumflex collaterals.Marland Kitchen  LEFT VENTRICULOGRAM: Left ventricular angiogram was done in the 30 RAO projection and revealed normal left ventricular wall motion and systolic function with an estimated ejection fraction of 50%.  IMPRESSIONS: 1. Significant 3 vessel coronary artery disease with circumflex total occlusion, high-grade diffuse obstruction in  the RCA, and borderline significant mid LAD obstruction.  2. Left ventricular ejection fraction 50-55% with moderate inferobasal hypokinesis/akinesis.  3. Nuclear myocardial perfusion study demonstrating 3 zone ischemia including the mid and apical LAD territory.  RECOMMENDATION:  1. Will have the patient seen by TCTS to determine the risk of surgical revascularization. Will also discuss with the patient percutaneous approaches including right coronary and LAD stenting. We would not attempt circumflex PCI.  2. Aggressive hydration today with discharge later this evening.Ronny Flurry: PROCEDURE: L Pelvic angiogram. Selective left lower extremity angiogram. Selective right lower extremity angiogram. .  INDICATIONS: Claudication or  The risks, benefits, and details of the procedure were explained to the patient. The patient verbalized understanding and wanted to proceed. Informed written consent was obtained.  PROCEDURE TECHNIQUE: Right femoral access was obtained by Dr. Katrinka Blazing for cardiac catheterization. A pigtail catheter was advanced to the aortic bifurcation and a power injection of contrast was performed in the AP projection. We obtained access to the contralateral iliac system using an Omni flush catheter and Versa core wire. An endhole catheter was advanced to the left external iliac artery. A power injection of contrast was performed to the endhole catheter to image the left lower extremity vasculature. Through the right femoral sheath, a power injection of contrast was performed to visualize the right lower extremity vasculature.  CONTRAST: Total of 75 cc.  COMPLICATIONS: None.  ANGIOGRAPHIC DATA: The right common iliac artery is heavily calcified. The stent in the right common iliac artery does appear patent. The right internal iliac artery is occluded and fills by collaterals from the inferior mesenteric artery.. The right external iliac artery is widely patent. The right common femoral artery  is patent. There is mild atherosclerosis. The right profunda femoral artery is widely patent. The right SFA has mild to moderate diffuse atherosclerosis. In the mid to distal vessel there is a focal 40% stenosis. The right popliteal artery is widely patent. There is mild atherosclerosis noted in the tibial vessels. All 3 tibial vessels are patent.  The left common iliac artery has moderate disease proximally. The left common iliac artery stent is patent with 25% mid stent in-stent restenosis. The left internal iliac artery is  occluded. This appears to fill by collaterals from the IMA. The left external iliac artery is patent. The left common femoral artery appears patent. The left profunda femoral artery is large. The left superficial femoral artery is occluded at the ostium. The left SFA reconstitutes in the midportion from collaterals from the left profunda femoral artery. The distal SFA is patent with only mild atherosclerosis. The left popliteal artery is widely patent. All 3 vessels are patent below the knee.  IMPRESSIONS:  1. Patent common iliac stents bilaterally. Occluded internal iliac arteries bilaterally.  2. Occluded left SFA from the proximal portion to the midportion. Collaterals from the left profunda femoral artery. Three-vessel runoff below the left knee.  3. Mild to moderate diffuse disease in the right SFA. Three-vessel runoff below the right knee.  RECOMMENDATION: He'll need management of his coronary artery disease per Dr. Katrinka Blazing. After this, he will followup with Dr. Myra Gianotti for further evaluation of his claudication. I encouraged him to stop smoking. He needs to try to continue walking to build circulation in his legs. He feels that he has failed medical therapy and I don't think he has any to percutaneous options. He is willing to consider left femoropopliteal bypass. Continue aspirin and lipid-lowering therapy.        Recent Lab Findings: No results found for this basename: WBC,   HGB,  HCT,  PLT,  GLUCOSE,  CHOL,  TRIG,  HDL,  LDLDIRECT,  LDLCALC,  ALT,  AST,  NA,  K,  CL,  CREATININE,  BUN,  CO2,  TSH,  INR,  GLUF,  HGBA1C      Assessment / Plan:   1. Three vessel coronary artery occlusive disease, with postive stress test 2 COPD, long term tobacco use 3 Limiting claudication left greater then right with sever peripheral vascular disease 4 Hypertension 5 Long term alcohol heavy use 6 Six month history of mental status changes per patients wife, with bilateral carotid bruits, r/o stroke 7  Multiple lacunar infarcts of the basal ganglia. 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 Myra Gianotti who is reviewed the carotid Doppler studies, prior to deciding whether to proceed with coronary artery bypass grafting and carotid endarterectomy simultaneously we will obtain a CTA of the neck and brain, to evaluate specifically the right internal carotid artery with the distal stenosis. The patient brings a letter from the [place] today noting that his creatinine was elevated to 1.8 an ultrasound of the kidney had been recommended we will obtain this preoperatively.  The patient will return to see me after the above studies are completed, and I will review the findings with Dr. Myra Gianotti and then decide about concomitant coronary artery bypass grafting carotid endarterectomy versus coronary artery bypass grafting the lung.      Delight Ovens MD  Beeper [phone] Office [phone] 09/15/2011 5:45 PM

## 2011-09-16 ENCOUNTER — Other Ambulatory Visit: Payer: Self-pay | Admitting: Cardiothoracic Surgery

## 2011-09-16 DIAGNOSIS — N189 Chronic kidney disease, unspecified: Secondary | ICD-10-CM

## 2011-09-16 DIAGNOSIS — I6529 Occlusion and stenosis of unspecified carotid artery: Secondary | ICD-10-CM

## 2011-09-18 ENCOUNTER — Other Ambulatory Visit: Payer: Self-pay | Admitting: Cardiothoracic Surgery

## 2011-09-18 LAB — BUN: BUN: 17 mg/dL (ref 6–23)

## 2011-09-18 LAB — CREATININE, SERUM: Creat: 1.58 mg/dL — ABNORMAL HIGH (ref 0.50–1.35)

## 2011-09-19 ENCOUNTER — Ambulatory Visit
Admission: RE | Admit: 2011-09-19 | Discharge: 2011-09-19 | Disposition: A | Discharge status: Home / Self Care | Payer: Medicare PPO | Source: Ambulatory Visit | Attending: Cardiothoracic Surgery | Admitting: Cardiothoracic Surgery

## 2011-09-19 ENCOUNTER — Other Ambulatory Visit: Payer: Self-pay | Admitting: *Deleted

## 2011-09-19 DIAGNOSIS — M109 Gout, unspecified: Secondary | ICD-10-CM

## 2011-09-19 DIAGNOSIS — I6529 Occlusion and stenosis of unspecified carotid artery: Secondary | ICD-10-CM

## 2011-09-19 DIAGNOSIS — N189 Chronic kidney disease, unspecified: Secondary | ICD-10-CM

## 2011-09-19 MED ORDER — COLCHICINE 0.6 MG PO TABS: 0.6000 mg | ORAL_TABLET | Freq: Every day | ORAL | Status: DC

## 2011-09-19 MED ORDER — IOHEXOL 350 MG/ML SOLN
80.0000 mL | Freq: Once | INTRAVENOUS | Status: AC | PRN
  Administered 2011-09-19: 80 mL via INTRAVENOUS

## 2011-09-25 ENCOUNTER — Ambulatory Visit (INDEPENDENT_AMBULATORY_CARE_PROVIDER_SITE_OTHER): Payer: Medicare PPO | Admitting: Cardiothoracic Surgery

## 2011-09-25 ENCOUNTER — Encounter: Payer: Self-pay | Admitting: Cardiothoracic Surgery

## 2011-09-25 VITALS — BP 117/60 | HR 64 | Resp 16 | Ht 73.0 in | Wt 210.0 lb

## 2011-09-25 DIAGNOSIS — I739 Peripheral vascular disease, unspecified: Secondary | ICD-10-CM

## 2011-09-25 DIAGNOSIS — I6529 Occlusion and stenosis of unspecified carotid artery: Secondary | ICD-10-CM

## 2011-09-25 DIAGNOSIS — I2510 Atherosclerotic heart disease of native coronary artery without angina pectoris: Secondary | ICD-10-CM

## 2011-09-25 NOTE — Progress Notes (Signed)
[address]            [place] [address]          [phone]         JANELLE CULTON [facility] Medical Record #[id number] Date of Birth: June 25, 1940  Referring: Lesleigh Noe, MD Primary Care: No primary provider on file.  Chief Complaint:    Chief Complaint  Patient presents with  . Coronary Artery Disease    further discussion after CT AND [place]  . Claudication     leg    History of Present Illness:   The patient returns today after obtaining a CTA of the neck and head to evaluate his carotid disease.  Patient with increasing left leg claudication. He has known history of PVD with stents to the iliac in 2007. He was referred to Dr Dahlia Byes and was to see April 29. He has a history of inferior mi in the past 12 years ago. Recent  Stress at the [facility] 07/2011 report ly showed reversible defect at the apex and anterior wall, fixed defect of the RCA and lateral wall.Patient does not complain of angina, does have exertional SOB and left leg claudication that is limiting. Cardiac cath done by Dr Katrinka Blazing in preparation for possible vascular surgery.Patient is long term smoker and says today he is not interested in stopping, 10 min of the visit was spent in discussing stopping.   Since seeing last week the patient has had no chest pain, he continues to smoke. He notes that he has no intention of stopping.   Current Activity/ Functional Status: Patient is independent with mobility/ambulation, transfers, ADL's, IADL's.   Past Medical History  Diagnosis Date  . Aorto-iliac disease     bilat with stent 2007  . CAD (coronary artery disease)     with loop to the circumflex 2000, inferolateral infarction  . Hypertension   . Paroxysmal atrial flutter     ablation  . ED (erectile dysfunction)   . COPD (chronic obstructive pulmonary disease)   . Hyperlipidemia     Past Surgical History  Procedure Date  . Spinal fusion   . Hand surgery     . Neck fusion     Family History: father died of "old" age at 37, Mother died of unknown cause but had history of TB, 3 sisters and one brother, brother had MI at "young" age  History   Social History  . Marital Status: Married    Spouse Name: N/A    Number of Children: N/A  . Years of Education: N/A   Occupational History  . retired    Social History Main Topics  . Smoking status: Current Everyday Smoker -- 1.0 packs/day for 60 years    Types: Cigarettes  . Smokeless tobacco: Never Used  . 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 Drug Use: No            History  Smoking status  . Current Everyday Smoker -- 1.0 packs/day for 60 years  . Types: Cigarettes  Smokeless tobacco  . Never Used    History  Alcohol Use  . 3.6 oz/week  .  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 allopurinol (ZYLOPRIM) 300 MG tablet Take 300 mg by mouth daily.      Marland Kitchen amLODipine (NORVASC) 10 MG tablet Take 5 mg by mouth daily.      Marland Kitchen aspirin EC 81 MG EC tablet Take 1 tablet (81 mg total) by mouth daily.  30 tablet  0  . buPROPion (WELLBUTRIN SR) 150 MG 12 hr tablet Take 150 mg by mouth 2 (two) times daily.      . colchicine 0.6 MG tablet Take 1 tablet (0.6 mg total) by mouth daily.  30 tablet  2  . isosorbide mononitrate (IMDUR) 30 MG 24 hr tablet Take 30 mg by mouth daily.      Marland Kitchen lisinopril (PRINIVIL,ZESTRIL) 20 MG tablet Take 10 mg by mouth daily.      . metoprolol (LOPRESSOR) 50 MG tablet Take 25 mg by mouth 2 (two) times daily.      . nitroGLYCERIN (NITROSTAT) 0.4 MG SL tablet Place 0.4 mg under the tongue every 5 (five) minutes as needed.      Marland Kitchen omeprazole (PRILOSEC) 20 MG capsule Take 20 mg by mouth daily.      .  simvastatin (ZOCOR) 10 MG tablet Take 10 mg by mouth at bedtime.           Review of Systems:     Cardiac Review of Systems: Y or N  Chest Pain Katrina.Aver    ]  Resting SOB [ n  ] Exertional SOB  [ y ]  Orthopnea [ n ]   Pedal Edema [ y  ]    Palpitations [ n ] Syncope  [ n ]   Presyncope [  n ]  General Review of Systems: [Y] = yes [  ]=no Constitional: recent weight change [ n ]; anorexia [n  ]; fatigue [ y ]; nausea [  ]; night sweats [n  ]; fever [n  ]; or chills [n  ];                                                                                                                                          Dental: poor dentition[y  ];   Eye : blurred vision [ n ]; diplopia [ n  ]; vision changes [ n ];  Amaurosis fugax[  ]; Resp: cough [  ];  wheezing[y  ];  hemoptysis[n  ]; shortness of breath[ y ]; paroxysmal nocturnal dyspnea[  ]; dyspnea on exertion[y  ]; or orthopnea[  ];  GI:  gallstones[  ], vomiting[  ];  dysphagia[  ]; melena[  ];  hematochezia [  ]; heartburn[  ];   Hx  of  Colonoscopy[  ]; GU: kidney stones [  ]; hematuria[  ];   dysuria [  ];  nocturia[  ];  history of     obstruction [  ];             Skin: rash, swelling[  ];, hair loss[  ];  peripheral edema[  ];  or itching[  ]; Musculosketetal: myalgias[  ];  joint swelling[  ];  joint erythema[  ];  joint pain[ y ];  back pain[  ];  Heme/Lymph: bruising[  ];  bleeding[  ];  anemia[  ];  Neuro: TIA[ n ];  headaches[  ];  stroke[  ];  vertigo[  ];  seizures[  ];   paresthesias[ n ];  difficulty walking[y  ];  Psych:depression[y  ]; anxiety[  ]; patient wife he has had change in mental status since OCT 2012 "in a fog"  Endocrine: diabetes[ n ];  thyroid dysfunction[  ];  Immunizations: Flu [ n ]; Pneumococcal[n  ];  Other:  Physical Exam: BP 117/60  Pulse 64  Resp 16  Ht 6\' 1"  (1.854 m)  Wt 210 lb (95.255 kg)  BMI 27.71 kg/m2  SpO2 96%  General appearance: alert, distracted, no distress and slowed  mentation Neurologic: intact Heart: regular rate and rhythm, S1, S2 normal, no murmur, click, rub or gallop and normal apical impulse Lungs: clear to auscultation bilaterally and normal percussion bilaterally Abdomen: soft, non-tender; bowel sounds normal; no masses,  no organomegaly Extremities: venous stasis dermatitis noted and patient has depent rubor left greater then right withou palpable pedal pulses, 2+ pedal edema bilatrerial Bilaterial carotid bruits No cervical or axillary adenopathy   Diagnostic Studies & Laboratory data:     Recent Radiology Findings:  Ct Angio Head W/cm &/or Wo Cm  09/19/2011  *RADIOLOGY REPORT*  Clinical Data:  Right carotid stenosis.  Bilateral lacunar infarcts.  CT ANGIOGRAPHY HEAD AND NECK  Technique:  Multidetector CT imaging of the head and neck was performed using the standard protocol during bolus administration of intravenous contrast.  Multiplanar CT image reconstructions including MIPs were obtained to evaluate the vascular anatomy. Carotid stenosis measurements (when applicable) are obtained utilizing NASCET criteria, using the distal internal carotid diameter as the denominator.  Contrast: 80mL OMNIPAQUE IOHEXOL 350 MG/ML SOLN  Comparison:  CT head without contrast 09/11/2011.  CTA NECK  Findings:  Atherosclerotic calcifications and circumferential wall thickening are present at the aortic arch without focal stenosis. The right vertebral artery is the dominant vessel.  Focal calcification is associated with a high-grade stenosis at the origin of the right vertebral artery.  There is a high-grade stenosis at the origin of the nondominant left vertebral artery. There is very poor opacification of the left vertebral artery in the neck with a more normal caliber evident at the C2-3 level, suggesting that there is indeed a significant proximal stenosis. The left vertebral artery essentially terminates at the left PICA, suggesting either high-grade stenosis or  occlusion.  Mild circumferential wall thickening and irregularity is noted in the distal common carotid artery.  There are calcifications at the bifurcation.  High-grade right internal carotid artery stenosis is present 2 cm from the carina with the equivalent of a string sign. The artery does assuming more normal caliber distal to this area.  An eccentric anterior soft tissue plaque in the distal left common carotid artery narrows the vessel to less than 50%.  There are some calcifications at the bifurcation without significant stenosis  relative to the distal vessel.  Normal ICA caliber is maintained.  The soft tissues of the neck demonstrate no significant adenopathy. No focal mucosal lesions are evident.  The salivary glands are symmetric.  Advance spondylosis is present in the cervical spine with anterior and posterior fusion.   Review of the MIP images confirms the above findings.  IMPRESSION:  1.  High-grade stenosis of the right internal carotid artery with a focal string sign approximately 2 cm from the bifurcation.  2.  Eccentric soft tissue plaque in the distal left common carotid artery with narrowing of less than 50%. 3.  High-grade calcific stenosis at the origin of the right vertebral artery, the dominant vessel. 4.  High-grade stenoses of the left vertebral artery air near occlusive with no contrast is evident beyond the left PICA.  A stone at the vertebral basilar junction suggests that this represents an occluded vessel. 5.  Advanced spondylosis of the cervical spine with extensive fusion.  CTA HEAD  Findings:  Noncontrast images of the head demonstrate multiple lacunar infarcts, as before.  There is no significant interval change.  No acute cortical infarct, hemorrhage, mass lesion is present.  A focal lacunar infarct is suggested in the left upper pons.  The ventricles are of normal size.  No significant extra- axial fluid collection is present.  The postcontrast images demonstrate no pathologic  enhancement.  The paranasal sinuses and mastoid air cells are clear.  Atherosclerotic calcifications are present within the cavernous carotid arteries bilaterally with irregularity, but no significant stenoses of greater than 50%.  The left A1 segment is hypoplastic. The A2 segments are triplicated, a normal variant.  The ACA bifurcations are within normal limits.  There is significant segmental attenuation of anterior MCA branch vessels bilaterally, worse on the right.  Distal segmental ACA stenoses are present as well.  The right vertebral artery feeds the basilar.  There is a stump of the distal left vertebral artery, compatible with occlusion.  The right PICA origin is visualized and within normal limits.  The posterior cerebral arteries both originate from the basilar tip.  A left posterior communicating artery is patent.  There is segmental irregularity of distal branch vessels.  The dural sinuses are patent.   Review of the MIP images confirms the above findings.  IMPRESSION:  1.  Moderate small vessel disease. 2.  Atherosclerotic calcifications within the cavernous carotid arteries bilaterally without significant stenosis. 3.  Hypoplastic right A1 segment. 4.  Occluded distal left vertebral artery.  The basilar artery is fed from the dominant right vertebral artery. 5.  Small left posterior communicating artery. 6.  Stable appearance of bilateral lacunar infarcts of the basal ganglia and left upper brain stem.  Original Report Authenticated By: Jamesetta Orleans. MATTERN, M.D.   Ct Head W Wo Contrast  09/11/2011  *RADIOLOGY REPORT*  Clinical Data: Memory loss.  Confusion.  Lethargy.  Unsteady gait. Coronary artery disease.  CT HEAD WITHOUT AND WITH CONTRAST  Technique:  Contiguous axial images were obtained from the base of the skull through the vertex without and with intravenous contrast.  Contrast: 80mL OMNIPAQUE IOHEXOL 300 MG/ML  SOLN  Comparison: None.  Findings: Multiple lacunar infarcts are present  within the basal ganglia.  Some of the views are clearly chronic.  There is a lesion in the left caudate head and another in the right thalamus which are age indeterminate.  Mild periventricular subcortical white matter hypoattenuation is present otherwise.  The postcontrast images demonstrate to a no  pathologic enhancement. The ventricles are of normal size.  No significant extra-axial fluid collection is present.  The paranasal sinuses and mastoid air cells are clear.  IMPRESSION:  1.  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  Although several are clearly remote, others are age indeterminate. MRI is more sensitive and specific for the evaluation of acute ischemia if the patient can tolerate an MRI. 2.  Mild generalized white matter disease likely reflects the sequelae of chronic microvascular ischemia.  Original Report Authenticated By: Jamesetta Orleans. MATTERN, M.D.   Ct Angio Neck W/cm &/or Wo/cm  09/19/2011  *RADIOLOGY REPORT*  Clinical Data:  Right carotid stenosis.  Bilateral lacunar infarcts.  CT ANGIOGRAPHY HEAD AND NECK  Technique:  Multidetector CT imaging of the head and neck was performed using the standard protocol during bolus administration of intravenous contrast.  Multiplanar CT image reconstructions including MIPs were obtained to evaluate the vascular anatomy. Carotid stenosis measurements (when applicable) are obtained utilizing NASCET criteria, using the distal internal carotid diameter as the denominator.  Contrast: 80mL OMNIPAQUE IOHEXOL 350 MG/ML SOLN  Comparison:  CT head without contrast 09/11/2011.  CTA NECK  Findings:  Atherosclerotic calcifications and circumferential wall thickening are present at the aortic arch without focal stenosis. The right vertebral artery is the dominant vessel.  Focal calcification is associated with a high-grade stenosis at the origin of the right vertebral artery.  There is a high-grade stenosis at the origin of the nondominant left vertebral artery.  There is very poor opacification of the left vertebral artery in the neck with a more normal caliber evident at the C2-3 level, suggesting that there is indeed a significant proximal stenosis. The left vertebral artery essentially terminates at the left PICA, suggesting either high-grade stenosis or occlusion.  Mild circumferential wall thickening and irregularity is noted in the distal common carotid artery.  There are calcifications at the bifurcation.  High-grade right internal carotid artery stenosis is present 2 cm from the carina with the equivalent of a string sign. The artery does assuming more normal caliber distal to this area.  An eccentric anterior soft tissue plaque in the distal left common carotid artery narrows the vessel to less than 50%.  There are some calcifications at the bifurcation without significant stenosis relative to the distal vessel.  Normal ICA caliber is maintained.  The soft tissues of the neck demonstrate no significant adenopathy. No focal mucosal lesions are evident.  The salivary glands are symmetric.  Advance spondylosis is present in the cervical spine with anterior and posterior fusion.   Review of the MIP images confirms the above findings.  IMPRESSION:  1.  High-grade stenosis of the right internal carotid artery with a focal string sign approximately 2 cm from the bifurcation.  2.  Eccentric soft tissue plaque in the distal left common carotid artery with narrowing of less than 50%. 3.  High-grade calcific stenosis at the origin of the right vertebral artery, the dominant vessel. 4.  High-grade stenoses of the left vertebral artery air near occlusive with no contrast is evident beyond the left PICA.  A stone at the vertebral basilar junction suggests that this represents an occluded vessel. 5.  Advanced spondylosis of the cervical spine with extensive fusion.  CTA HEAD  Findings:  Noncontrast images of the head demonstrate multiple lacunar infarcts, as before.  There is no  significant interval change.  No acute cortical infarct, hemorrhage, mass lesion is present.  A focal lacunar infarct is suggested in the left  upper pons.  The ventricles are of normal size.  No significant extra- axial fluid collection is present.  The postcontrast images demonstrate no pathologic enhancement.  The paranasal sinuses and mastoid air cells are clear.  Atherosclerotic calcifications are present within the cavernous carotid arteries bilaterally with irregularity, but no significant stenoses of greater than 50%.  The left A1 segment is hypoplastic. The A2 segments are triplicated, a normal variant.  The ACA bifurcations are within normal limits.  There is significant segmental attenuation of anterior MCA branch vessels bilaterally, worse on the right.  Distal segmental ACA stenoses are present as well.  The right vertebral artery feeds the basilar.  There is a stump of the distal left vertebral artery, compatible with occlusion.  The right PICA origin is visualized and within normal limits.  The posterior cerebral arteries both originate from the basilar tip.  A left posterior communicating artery is patent.  There is segmental irregularity of distal branch vessels.  The dural sinuses are patent.   Review of the MIP images confirms the above findings.  IMPRESSION:  1.  Moderate small vessel disease. 2.  Atherosclerotic calcifications within the cavernous carotid arteries bilaterally without significant stenosis. 3.  Hypoplastic right A1 segment. 4.  Occluded distal left vertebral artery.  The basilar artery is fed from the dominant right vertebral artery. 5.  Small left posterior communicating artery. 6.  Stable appearance of bilateral lacunar infarcts of the basal ganglia and left upper brain stem.  Original Report Authenticated By: Jamesetta Orleans. MATTERN, M.D.   US Renal  09/19/2011  *RADIOLOGY REPORT*  Clinical Data: Chronic renal failure, evaluate for obstruction  RENAL/URINARY TRACT ULTRASOUND  COMPLETE  Comparison:  CT angio abdomen pelvis of 07/15/2011  Findings:  Right Kidney:  No hydronephrosis is seen.  The right kidney measures 10.9 cm sagittally.  The echogenicity of the right kidney is within normal limits.  Left Kidney:  No hydronephrosis is noted.  The left kidney measures 11.2 cm, and the parenchyma also is normal in echogenicity.  Bladder:  The urinary bladder is unremarkable.  The prostate measures 4.7 cm in maximum diameter.  IMPRESSION: No hydronephrosis.  Original Report Authenticated By: Juline Patch, M.D.  Ct Head W Wo Contrast  09/11/2011  *RADIOLOGY REPORT*  Clinical Data: Memory loss.  Confusion.  Lethargy.  Unsteady gait. Coronary artery disease.  CT HEAD WITHOUT AND WITH CONTRAST  Technique:  Contiguous axial images were obtained from the base of the skull through the vertex without and with intravenous contrast.  Contrast: 80mL OMNIPAQUE IOHEXOL 300 MG/ML  SOLN  Comparison: None.  Findings: Multiple lacunar infarcts are present within the basal ganglia.  Some of the views are clearly chronic.  There is a lesion in the left caudate head and another in the right thalamus which are age indeterminate.  Mild periventricular subcortical white matter hypoattenuation is present otherwise.  The postcontrast images demonstrate to a no pathologic enhancement. The ventricles are of normal size.  No significant extra-axial fluid collection is present.  The paranasal sinuses and mastoid air cells are clear.  IMPRESSION:  1.  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  Although several are clearly remote, others are age indeterminate. MRI is more sensitive and specific for the evaluation of acute ischemia if the patient can tolerate an MRI. 2.  Mild generalized white matter disease likely reflects the sequelae of chronic microvascular ischemia.  Original Report Authenticated By: Jamesetta Orleans. MATTERN, M.D.   Cardiac Cath:  Shane Badeaux  Doron Shake.  71 y.o.  male  June 24, 1940  Procedure  Date: 09/04/2011  Referring Physician: [facility]  Primary Cardiologist:: Tiburcio Pea, III  PROCEDURE: Left heart catheterization with selective coronary angiography, left ventriculogram.  INDICATIONS: Nuclear study performed at the [facility] demonstrating 3 zone myocardial ischemia with a large inferior and lateral wall defect and smaller mid anterior and apical ischemic zones.  The risks, benefits, and details of the procedure were explained to the patient. The patient verbalized understanding and wanted to proceed. Informed written consent was obtained.  PROCEDURE TECHNIQUE: After Xylocaine anesthesia a 5 French sheath was placed in the right femoral artery with a single anterior needle wall stick. Coronary angiography was done using a 5 [place] A2MP catheter. Left ventriculography was done using a 5 [place] A2 MP catheter.  CONTRAST: Total of 70 cc.  COMPLICATIONS: None.  HEMODYNAMICS: Aortic pressure was 144/68 mmHg; LV pressure was 146/19 mmHg; LVEDP 29 mmHg. There was no gradient between the left ventricle and aorta.  ANGIOGRAPHIC DATA: The left main coronary artery is widely patent..  The left anterior descending artery is heavy calcification in the proximal to midsegment. Within this region there is obstruction in the 60-75% range. The LAD is large and wraps around the left ventricular apex. The second diagonal which is large contains ostial 50-70% stenosis..  The left circumflex artery is totally occluded after the first obtuse marginal. Proximal to the first marginal there is a 95% stenosis. The first marginal is small to moderate in size. 2 distal marginals fill late via left to left and right-to-left collaterals..  The right coronary artery is codominant with segmental 90% stenosis proximal and mid over a long segment. This vessel gives origin to left circumflex collaterals.Marland Kitchen  LEFT VENTRICULOGRAM: Left ventricular angiogram was done in the 30 RAO projection and revealed normal left  ventricular wall motion and systolic function with an estimated ejection fraction of 50%.  IMPRESSIONS: 1. Significant 3 vessel coronary artery disease with circumflex total occlusion, high-grade diffuse obstruction in the RCA, and borderline significant mid LAD obstruction.  2. Left ventricular ejection fraction 50-55% with moderate inferobasal hypokinesis/akinesis.  3. Nuclear myocardial perfusion study demonstrating 3 zone ischemia including the mid and apical LAD territory.  RECOMMENDATION:  1. Will have the patient seen by TCTS to determine the risk of surgical revascularization. Will also discuss with the patient percutaneous approaches including right coronary and LAD stenting. We would not attempt circumflex PCI.  2. Aggressive hydration today with discharge later this evening.Ronny Flurry: PROCEDURE: L Pelvic angiogram. Selective left lower extremity angiogram. Selective right lower extremity angiogram. .  INDICATIONS: Claudication or  The risks, benefits, and details of the procedure were explained to the patient. The patient verbalized understanding and wanted to proceed. Informed written consent was obtained.  PROCEDURE TECHNIQUE: Right femoral access was obtained by Dr. Katrinka Blazing for cardiac catheterization. A pigtail catheter was advanced to the aortic bifurcation and a power injection of contrast was performed in the AP projection. We obtained access to the contralateral iliac system using an Omni flush catheter and Versa core wire. An endhole catheter was advanced to the left external iliac artery. A power injection of contrast was performed to the endhole catheter to image the left lower extremity vasculature. Through the right femoral sheath, a power injection of contrast was performed to visualize the right lower extremity vasculature.  CONTRAST: Total of 75 cc.  COMPLICATIONS: None.  ANGIOGRAPHIC DATA: The right common iliac artery is heavily calcified.  The stent in the right common  iliac artery does appear patent. The right internal iliac artery is occluded and fills by collaterals from the inferior mesenteric artery.. The right external iliac artery is widely patent. The right common femoral artery is patent. There is mild atherosclerosis. The right profunda femoral artery is widely patent. The right SFA has mild to moderate diffuse atherosclerosis. In the mid to distal vessel there is a focal 40% stenosis. The right popliteal artery is widely patent. There is mild atherosclerosis noted in the tibial vessels. All 3 tibial vessels are patent.  The left common iliac artery has moderate disease proximally. The left common iliac artery stent is patent with 25% mid stent in-stent restenosis. The left internal iliac artery is occluded. This appears to fill by collaterals from the IMA. The left external iliac artery is patent. The left common femoral artery appears patent. The left profunda femoral artery is large. The left superficial femoral artery is occluded at the ostium. The left SFA reconstitutes in the midportion from collaterals from the left profunda femoral artery. The distal SFA is patent with only mild atherosclerosis. The left popliteal artery is widely patent. All 3 vessels are patent below the knee.  IMPRESSIONS:  1. Patent common iliac stents bilaterally. Occluded internal iliac arteries bilaterally.  2. Occluded left SFA from the proximal portion to the midportion. Collaterals from the left profunda femoral artery. Three-vessel runoff below the left knee.  3. Mild to moderate diffuse disease in the right SFA. Three-vessel runoff below the right knee.  RECOMMENDATION: He'll need management of his coronary artery disease per Dr. Katrinka Blazing. After this, he will followup with Dr. Myra Gianotti for further evaluation of his claudication. I encouraged him to stop smoking. He needs to try to continue walking to build circulation in his legs. He feels that he has failed medical therapy and I  don't think he has any to percutaneous options. He is willing to consider left femoropopliteal bypass. Continue aspirin and lipid-lowering therapy.        Recent Lab Findings: No results found for this basename: WBC,  HGB,  HCT,  PLT,  GLUCOSE,  CHOL,  TRIG,  HDL,  LDLDIRECT,  LDLCALC,  ALT,  AST,  NA,  K,  CL,  CREATININE,  BUN,  CO2,  TSH,  INR,  GLUF,  HGBA1C      Assessment / Plan:   1. Three vessel coronary artery occlusive disease, with postive stress test 2 COPD, long term tobacco use 3 Limiting claudication left greater then right with sever peripheral vascular disease 4 Hypertension 5 Long term alcohol heavy use 6 Six month history of mental status changes per patients wife, with bilateral carotid bruits, r/o stroke 7  Multiple lacunar infarcts of the basal ganglia. By CT of head this week 8 Chronic Renal disease stage 3 reported from lab test done at [facility] Cr 1.8 9  Greater then 80 % right distal internal carotid stenosis, with evidence of bilateral lacunar infarcts and confirmed on CTA with disease 2 cm above the bifurcation of the carotid on the right  I discussed the case with Dr. Myra Gianotti who is reviewed the carotid Doppler studies, prior to deciding whether to proceed with coronary artery bypass grafting and carotid endarterectomy simultaneously we have obtained a CTA of the neck and brain, to evaluate specifically the right internal carotid artery with the distal stenosis.  The patient has appointment Monday to see Dr Myra Gianotti to evaluate rt carotid disease before CABG  Delight Ovens MD  Beeper [phone] Office [phone] 09/25/2011 5:22 PM

## 2011-09-26 ENCOUNTER — Encounter: Payer: Self-pay | Admitting: Surgery

## 2011-09-29 ENCOUNTER — Encounter: Payer: Self-pay | Admitting: Surgery

## 2011-09-29 ENCOUNTER — Ambulatory Visit (INDEPENDENT_AMBULATORY_CARE_PROVIDER_SITE_OTHER): Payer: Medicare PPO | Admitting: Surgery

## 2011-09-29 VITALS — BP 141/63 | HR 62 | Temp 98.2°F | Ht 73.0 in | Wt 207.0 lb

## 2011-09-29 DIAGNOSIS — I6529 Occlusion and stenosis of unspecified carotid artery: Secondary | ICD-10-CM | POA: Insufficient documentation

## 2011-09-29 NOTE — Progress Notes (Signed)
[facility] [place]   Patient name: Nathan [name] MRN: [id number] DOB: 04/02/1941 Sex: male   Referred by: Dr. Tyrone Sage  Reason for referral:  Chief Complaint  Patient presents with  . Carotid    New pt/Dr. Tyrone Sage    HISTORY OF PRESENT ILLNESS: The patient is referred today for evaluation of right carotid stenosis. He has been followed by Dr. Katrinka Blazing for peripheral vascular disease as well as coronary artery disease. He is previously undergone lower extremity intervention by Dr. Forest Gleason.  He recently underwent cardiac catheterization which showed significant disease and was referred for possible coronary artery bypass grafting. During his workup he was found to have a high-grade carotid stenosis on the right by duplex. This was further evaluated with CT imaging. He is here today to discuss his options. The patient denies symptoms. Specifically he denies numbness or weakness in either extremity. He denies slurred speech. He denies amaurosis fugax. The patient is a current smoker. He is trying to stop. Smoking for 63 years. He is medically managed for his hypertension and hypercholesterolemia which had been controlled  Past Medical History  Diagnosis Date  . Aorto-iliac disease     bilat with stent 2007  . CAD (coronary artery disease)     with loop to the circumflex 2000, inferolateral infarction  . Hypertension   . Paroxysmal atrial flutter     ablation  . ED (erectile dysfunction)   . COPD (chronic obstructive pulmonary disease)   . Hyperlipidemia   . Stroke     Past Surgical History  Procedure Date  . Spinal fusion   . Hand surgery   . Neck fusion 1975    History   Social History  . Marital Status: Married    Spouse Name: N/A    Number of Children: N/A  . Years of Education: N/A   Occupational History  . retired    Social History Main Topics  . Smoking status: Current Everyday Smoker -- 1.0 packs/day for 60 years    Types: Cigarettes  .  Smokeless tobacco: Never Used   Comment: smoking cessation info given and reviewed  . 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 Drug Use: No  . Sexually Active: Not on file   Other Topics Concern  . Not on file   Social History Narrative  . No narrative on file    Family History  Problem Relation Age of Onset  . Cancer Mother     Allergies as of 09/29/2011  . (No Known Allergies)    Current Outpatient Prescriptions on File Prior to Visit  Medication Sig Dispense Refill  . allopurinol (ZYLOPRIM) 300 MG tablet Take 300 mg by mouth daily.      Marland Kitchen amLODipine (NORVASC) 10 MG tablet Take 5 mg by mouth daily.      Marland Kitchen aspirin EC 81 MG EC tablet Take 1 tablet (81 mg total) by mouth daily.  30 tablet  0  . buPROPion (WELLBUTRIN SR) 150 MG 12 hr tablet Take 150 mg by mouth 2 (two) times daily.      . colchicine 0.6 MG tablet Take 1 tablet (0.6 mg total) by mouth daily.  30 tablet  2  . isosorbide mononitrate (IMDUR) 30 MG 24 hr tablet Take 30 mg by mouth daily.      Marland Kitchen  lisinopril (PRINIVIL,ZESTRIL) 20 MG tablet Take 10 mg by mouth daily.      . metoprolol (LOPRESSOR) 50 MG tablet Take 25 mg by mouth 2 (two) times daily.      . nitroGLYCERIN (NITROSTAT) 0.4 MG SL tablet Place 0.4 mg under the tongue every 5 (five) minutes as needed.      Marland Kitchen omeprazole (PRILOSEC) 20 MG capsule Take 20 mg by mouth daily.      . simvastatin (ZOCOR) 10 MG tablet Take 10 mg by mouth at bedtime.         REVIEW OF SYSTEMS: Cardiovascular: Positive for shortness of breath when lying flat, shortness of breath with exertion, pain in legs with walking.  Pulmonary: No productive cough, asthma.  Positive for wheezing. Neurologic: No  paresthesias, aphasia, or amaurosis. No dizziness. Hematologic: No bleeding problems or clotting disorders. Musculoskeletal: No joint pain or joint swelling. Gastrointestinal: No  blood in stool or hematemesis Genitourinary: No dysuria or hematuria. Psychiatric:: No history of major depression. Integumentary: No rashes or ulcers. Constitutional: No fever or chills.  PHYSICAL EXAMINATION: General: The patient appears their stated age.  Vital signs are BP 141/63  Pulse 62  Temp(Src) 98.2 F (36.8 C) (Oral)  Ht 6\' 1"  (1.854 m)  Wt 207 lb (93.895 kg)  BMI 27.31 kg/m2  SpO2 99% HEENT:  No gross abnormalities Pulmonary: Respirations are non-labored Abdomen: Soft and non-tender  Musculoskeletal: There are no major deformities.   Neurologic: No focal weakness or paresthesias are detected, Skin: There are no ulcer or rashes noted. Psychiatric: The patient has normal affect. Cardiovascular: There is a regular rate and rhythm without significant murmur appreciated. Bilateral carotid bruits. Palpable femoral pulses bilaterally  Diagnostic Studies: I have reviewed his CT angiogram of the neck which shows high-grade right carotid stenosis.   Assessment:  Right carotid stenosis, greater than 80% in the setting of coronary artery disease Plan: I discussed several options for management of this problem. The first would be to proceed with combined carotid CABG. The second would be carotid stenting followed by coronary intervention. The third would be carotid endarterectomy followed by coronary intervention. Fourth would be to proceed with coronary intervention followed by carotid endarterectomy. After reviewing the procedures as well as the pros and cons the patient wishes to proceed with a combined right carotid endarterectomy and coronary artery bypass grafting. I like to discuss this further with both Dr. Katrinka Blazing and Dr. Tyrone Sage.  I discussed the risks and benefits of surgery which include the risk of nerve injury, the risk of stroke, risk of bleeding. All his questions were answered today. Contact him on a final decision has been made.     Jorge Ny,  M.D. [facility] [place] Office: [phone] Pager:  [phone]

## 2011-10-20 ENCOUNTER — Other Ambulatory Visit: Payer: Self-pay | Admitting: Cardiothoracic Surgery

## 2011-10-20 DIAGNOSIS — I2510 Atherosclerotic heart disease of native coronary artery without angina pectoris: Secondary | ICD-10-CM

## 2011-10-21 ENCOUNTER — Other Ambulatory Visit: Payer: Self-pay

## 2011-10-21 DIAGNOSIS — I2510 Atherosclerotic heart disease of native coronary artery without angina pectoris: Secondary | ICD-10-CM

## 2011-10-22 ENCOUNTER — Other Ambulatory Visit: Payer: Self-pay

## 2011-10-23 ENCOUNTER — Ambulatory Visit (INDEPENDENT_AMBULATORY_CARE_PROVIDER_SITE_OTHER): Payer: Medicare PPO | Admitting: Cardiothoracic Surgery

## 2011-10-23 ENCOUNTER — Encounter (HOSPITAL_COMMUNITY): Payer: Self-pay | Admitting: Pharmacy Technician

## 2011-10-23 ENCOUNTER — Encounter: Payer: Self-pay | Admitting: Cardiothoracic Surgery

## 2011-10-23 VITALS — BP 134/72 | HR 62 | Resp 18 | Ht 73.0 in | Wt 204.0 lb

## 2011-10-23 DIAGNOSIS — I2510 Atherosclerotic heart disease of native coronary artery without angina pectoris: Secondary | ICD-10-CM

## 2011-10-23 DIAGNOSIS — I6529 Occlusion and stenosis of unspecified carotid artery: Secondary | ICD-10-CM

## 2011-10-23 DIAGNOSIS — I739 Peripheral vascular disease, unspecified: Secondary | ICD-10-CM

## 2011-10-23 NOTE — Progress Notes (Signed)
[address]            [place] [address]          [phone]    Nathan [name] [facility] Medical Record #[id number] Date of Birth: [birth year]  Referring: Lesleigh Noe, MD Primary Care: Lesleigh Noe, MD  Chief Complaint:    Chief Complaint  Patient presents with  . Coronary Artery Disease    Further discuss surgery for 10/31/11    History of Present Illness:   The patient returns today after obtaining a CTA of the neck and head to evaluate his carotid disease. And evaluated by vascular surgery Patient with increasing left leg claudication. He has known history of PVD with stents to the iliac in 2007. He has a history of inferior mi in the past 12 years ago. Recent  Stress at the [facility] 07/2011 report ly showed reversible defect at the apex and anterior wall, fixed defect of the RCA and lateral wall.Patient does not complain of angina, does have exertional SOB and left leg claudication that is limiting. Cardiac cath done by Dr Katrinka Blazing in preparation for possible vascular surgery.Patient is long term smoker and says today he is not interested in stopping, 10 min of the visit was spent in discussing stopping.   Since seeing initially the patient has had no chest pain, he continues to smoke. He notes that he has no intention of stopping.   Current Activity/ Functional Status: Patient is independent with mobility/ambulation, transfers, ADL's, IADL's.   Past Medical History  Diagnosis Date  . Aorto-iliac disease     bilat with stent 2007  . CAD (coronary artery disease)     with loop to the circumflex 2000, inferolateral infarction  . Hypertension   . Paroxysmal atrial flutter     ablation  . ED (erectile dysfunction)   . COPD (chronic obstructive pulmonary disease)   . Hyperlipidemia   . Stroke     Past Surgical History  Procedure Date  . Spinal fusion   . Hand surgery   . Neck fusion 1975    Family  History: father died of "old" age at 60, Mother died of unknown cause but had history of TB, 3 sisters and one brother, brother had MI at "young" age  History   Social History  . Marital Status: Married    Spouse Name: N/A    Number of Children: N/A  . Years of Education: N/A   Occupational History  . retired    Social History Main Topics  . Smoking status: Current Everyday Smoker -- 1.0 packs/day for 60 years    Types: Cigarettes  . Smokeless tobacco: Never Used  . 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 Drug Use: No            History  Smoking status  . Current Everyday Smoker -- 1.0 packs/day for 60 years  . Types: Cigarettes  Smokeless tobacco  . Never Used  Comment: smoking cessation info given and reviewed    History  Alcohol Use  .  3.6 oz/week  . 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 allopurinol (ZYLOPRIM) 300 MG tablet Take 300 mg by mouth daily.      Marland Kitchen amLODipine (NORVASC) 10 MG tablet Take 5 mg by mouth daily.      Marland Kitchen aspirin EC 81 MG EC tablet Take 1 tablet (81 mg total) by mouth daily.  30 tablet  0  . colchicine 0.6 MG tablet Take 1 tablet (0.6 mg total) by mouth daily.  30 tablet  2  . isosorbide mononitrate (IMDUR) 30 MG 24 hr tablet Take 30 mg by mouth daily.      Marland Kitchen lisinopril (PRINIVIL,ZESTRIL) 20 MG tablet Take 10 mg by mouth daily.      . metoprolol (LOPRESSOR) 50 MG tablet Take 25 mg by mouth 2 (two) times daily.      . nitroGLYCERIN (NITROSTAT) 0.4 MG SL tablet Place 0.4 mg under the tongue every 5 (five) minutes as needed. For chest pain.      Marland Kitchen omeprazole (PRILOSEC) 20 MG capsule Take 20 mg by mouth daily.      . simvastatin (ZOCOR) 10 MG tablet Take 10 mg by mouth at  bedtime.      Marland Kitchen buPROPion (WELLBUTRIN SR) 150 MG 12 hr tablet Take 150 mg by mouth 2 (two) times daily.           Review of Systems:     Cardiac Review of Systems: Y or N  Chest Pain Katrina.Aver    ]  Resting SOB [ n  ] Exertional SOB  [ y ]  Orthopnea [ n ]   Pedal Edema [ y  ]    Palpitations [ n ] Syncope  [ n ]   Presyncope [  n ]  General Review of Systems: [Y] = yes [  ]=no Constitional: recent weight change [ n ]; anorexia [n  ]; fatigue [ y ]; nausea [  ]; night sweats [n  ]; fever [n  ]; or chills [n  ];                                                                                                                                          Dental: poor dentition[y  ];   Eye : blurred vision [ n ]; diplopia [ n  ]; vision changes [ n ];  Amaurosis fugax[  ]; Resp: cough [  ];  wheezing[y  ];  hemoptysis[n  ]; shortness of breath[ y ]; paroxysmal nocturnal dyspnea[  ]; dyspnea on exertion[y  ]; or orthopnea[  ];  GI:  gallstones[  ], vomiting[  ];  dysphagia[  ]; melena[  ];  hematochezia [  ];  heartburn[  ];   Hx of  Colonoscopy[  ]; GU: kidney stones [  ]; hematuria[  ];   dysuria [  ];  nocturia[  ];  history of     obstruction [  ];             Skin: rash, swelling[  ];, hair loss[  ];  peripheral edema[  ];  or itching[  ]; Musculosketetal: myalgias[  ];  joint swelling[  ];  joint erythema[  ];  joint pain[ y ];  back pain[  ];  Heme/Lymph: bruising[  ];  bleeding[  ];  anemia[  ];  Neuro: TIA[ n ];  headaches[  ];  stroke[  ];  vertigo[  ];  seizures[  ];   paresthesias[ n ];  difficulty walking[y  ];  Psych:depression[y  ]; anxiety[  ]; patient wife he has had change in mental status since OCT 2012 "in a fog"  Endocrine: diabetes[ n ];  thyroid dysfunction[  ];  Immunizations: Flu [ n ]; Pneumococcal[n  ];  Other:  Physical Exam: BP 134/72  Pulse 62  Resp 18  Ht 6\' 1"  (1.854 m)  Wt 204 lb (92.534 kg)  BMI 26.91 kg/m2  SpO2 97%  General appearance: alert, distracted, no  distress and slowed mentation Neurologic: intact Heart: regular rate and rhythm, S1, S2 normal, no murmur, click, rub or gallop and normal apical impulse Lungs: clear to auscultation bilaterally and normal percussion bilaterally Abdomen: soft, non-tender; bowel sounds normal; no masses,  no organomegaly Extremities: venous stasis dermatitis noted and patient has depent rubor left greater then right withou palpable pedal pulses, 2+ pedal edema bilatrerial Bilaterial carotid bruits No cervical or axillary adenopathy   Diagnostic Studies & Laboratory data:     Recent Radiology Findings:  Ct Angio Head W/cm &/or Wo Cm  09/19/2011  *RADIOLOGY REPORT*  Clinical Data:  Right carotid stenosis.  Bilateral lacunar infarcts.  CT ANGIOGRAPHY HEAD AND NECK  Technique:  Multidetector CT imaging of the head and neck was performed using the standard protocol during bolus administration of intravenous contrast.  Multiplanar CT image reconstructions including MIPs were obtained to evaluate the vascular anatomy. Carotid stenosis measurements (when applicable) are obtained utilizing NASCET criteria, using the distal internal carotid diameter as the denominator.  Contrast: 80mL OMNIPAQUE IOHEXOL 350 MG/ML SOLN  Comparison:  CT head without contrast 09/11/2011.  CTA NECK  Findings:  Atherosclerotic calcifications and circumferential wall thickening are present at the aortic arch without focal stenosis. The right vertebral artery is the dominant vessel.  Focal calcification is associated with a high-grade stenosis at the origin of the right vertebral artery.  There is a high-grade stenosis at the origin of the nondominant left vertebral artery. There is very poor opacification of the left vertebral artery in the neck with a more normal caliber evident at the C2-3 level, suggesting that there is indeed a significant proximal stenosis. The left vertebral artery essentially terminates at the left PICA, suggesting either  high-grade stenosis or occlusion.  Mild circumferential wall thickening and irregularity is noted in the distal common carotid artery.  There are calcifications at the bifurcation.  High-grade right internal carotid artery stenosis is present 2 cm from the carina with the equivalent of a string sign. The artery does assuming more normal caliber distal to this area.  An eccentric anterior soft tissue plaque in the distal left common carotid artery narrows the vessel to less than 50%.  There are some calcifications  at the bifurcation without significant stenosis relative to the distal vessel.  Normal ICA caliber is maintained.  The soft tissues of the neck demonstrate no significant adenopathy. No focal mucosal lesions are evident.  The salivary glands are symmetric.  Advance spondylosis is present in the cervical spine with anterior and posterior fusion.   Review of the MIP images confirms the above findings.  IMPRESSION:  1.  High-grade stenosis of the right internal carotid artery with a focal string sign approximately 2 cm from the bifurcation.  2.  Eccentric soft tissue plaque in the distal left common carotid artery with narrowing of less than 50%. 3.  High-grade calcific stenosis at the origin of the right vertebral artery, the dominant vessel. 4.  High-grade stenoses of the left vertebral artery air near occlusive with no contrast is evident beyond the left PICA.  A stone at the vertebral basilar junction suggests that this represents an occluded vessel. 5.  Advanced spondylosis of the cervical spine with extensive fusion.  CTA HEAD  Findings:  Noncontrast images of the head demonstrate multiple lacunar infarcts, as before.  There is no significant interval change.  No acute cortical infarct, hemorrhage, mass lesion is present.  A focal lacunar infarct is suggested in the left upper pons.  The ventricles are of normal size.  No significant extra- axial fluid collection is present.  The postcontrast images  demonstrate no pathologic enhancement.  The paranasal sinuses and mastoid air cells are clear.  Atherosclerotic calcifications are present within the cavernous carotid arteries bilaterally with irregularity, but no significant stenoses of greater than 50%.  The left A1 segment is hypoplastic. The A2 segments are triplicated, a normal variant.  The ACA bifurcations are within normal limits.  There is significant segmental attenuation of anterior MCA branch vessels bilaterally, worse on the right.  Distal segmental ACA stenoses are present as well.  The right vertebral artery feeds the basilar.  There is a stump of the distal left vertebral artery, compatible with occlusion.  The right PICA origin is visualized and within normal limits.  The posterior cerebral arteries both originate from the basilar tip.  A left posterior communicating artery is patent.  There is segmental irregularity of distal branch vessels.  The dural sinuses are patent.   Review of the MIP images confirms the above findings.  IMPRESSION:  1.  Moderate small vessel disease. 2.  Atherosclerotic calcifications within the cavernous carotid arteries bilaterally without significant stenosis. 3.  Hypoplastic right A1 segment. 4.  Occluded distal left vertebral artery.  The basilar artery is fed from the dominant right vertebral artery. 5.  Small left posterior communicating artery. 6.  Stable appearance of bilateral lacunar infarcts of the basal ganglia and left upper brain stem.  Original Report Authenticated By: Jamesetta Orleans. MATTERN, M.D.   Ct Head W Wo Contrast  09/11/2011  *RADIOLOGY REPORT*  Clinical Data: Memory loss.  Confusion.  Lethargy.  Unsteady gait. Coronary artery disease.  CT HEAD WITHOUT AND WITH CONTRAST  Technique:  Contiguous axial images were obtained from the base of the skull through the vertex without and with intravenous contrast.  Contrast: 80mL OMNIPAQUE IOHEXOL 300 MG/ML  SOLN  Comparison: None.  Findings: Multiple  lacunar infarcts are present within the basal ganglia.  Some of the views are clearly chronic.  There is a lesion in the left caudate head and another in the right thalamus which are age indeterminate.  Mild periventricular subcortical white matter hypoattenuation is present otherwise.  The  postcontrast images demonstrate to a no pathologic enhancement. The ventricles are of normal size.  No significant extra-axial fluid collection is present.  The paranasal sinuses and mastoid air cells are clear.  IMPRESSION:  1.  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  Although several are clearly remote, others are age indeterminate. MRI is more sensitive and specific for the evaluation of acute ischemia if the patient can tolerate an MRI. 2.  Mild generalized white matter disease likely reflects the sequelae of chronic microvascular ischemia.  Original Report Authenticated By: Jamesetta Orleans. MATTERN, M.D.   Ct Angio Neck W/cm &/or Wo/cm  09/19/2011  *RADIOLOGY REPORT*  Clinical Data:  Right carotid stenosis.  Bilateral lacunar infarcts.  CT ANGIOGRAPHY HEAD AND NECK  Technique:  Multidetector CT imaging of the head and neck was performed using the standard protocol during bolus administration of intravenous contrast.  Multiplanar CT image reconstructions including MIPs were obtained to evaluate the vascular anatomy. Carotid stenosis measurements (when applicable) are obtained utilizing NASCET criteria, using the distal internal carotid diameter as the denominator.  Contrast: 80mL OMNIPAQUE IOHEXOL 350 MG/ML SOLN  Comparison:  CT head without contrast 09/11/2011.  CTA NECK  Findings:  Atherosclerotic calcifications and circumferential wall thickening are present at the aortic arch without focal stenosis. The right vertebral artery is the dominant vessel.  Focal calcification is associated with a high-grade stenosis at the origin of the right vertebral artery.  There is a high-grade stenosis at the origin of the  nondominant left vertebral artery. There is very poor opacification of the left vertebral artery in the neck with a more normal caliber evident at the C2-3 level, suggesting that there is indeed a significant proximal stenosis. The left vertebral artery essentially terminates at the left PICA, suggesting either high-grade stenosis or occlusion.  Mild circumferential wall thickening and irregularity is noted in the distal common carotid artery.  There are calcifications at the bifurcation.  High-grade right internal carotid artery stenosis is present 2 cm from the carina with the equivalent of a string sign. The artery does assuming more normal caliber distal to this area.  An eccentric anterior soft tissue plaque in the distal left common carotid artery narrows the vessel to less than 50%.  There are some calcifications at the bifurcation without significant stenosis relative to the distal vessel.  Normal ICA caliber is maintained.  The soft tissues of the neck demonstrate no significant adenopathy. No focal mucosal lesions are evident.  The salivary glands are symmetric.  Advance spondylosis is present in the cervical spine with anterior and posterior fusion.   Review of the MIP images confirms the above findings.  IMPRESSION:  1.  High-grade stenosis of the right internal carotid artery with a focal string sign approximately 2 cm from the bifurcation.  2.  Eccentric soft tissue plaque in the distal left common carotid artery with narrowing of less than 50%. 3.  High-grade calcific stenosis at the origin of the right vertebral artery, the dominant vessel. 4.  High-grade stenoses of the left vertebral artery air near occlusive with no contrast is evident beyond the left PICA.  A stone at the vertebral basilar junction suggests that this represents an occluded vessel. 5.  Advanced spondylosis of the cervical spine with extensive fusion.  CTA HEAD  Findings:  Noncontrast images of the head demonstrate multiple lacunar  infarcts, as before.  There is no significant interval change.  No acute cortical infarct, hemorrhage, mass lesion is present.  A focal lacunar  infarct is suggested in the left upper pons.  The ventricles are of normal size.  No significant extra- axial fluid collection is present.  The postcontrast images demonstrate no pathologic enhancement.  The paranasal sinuses and mastoid air cells are clear.  Atherosclerotic calcifications are present within the cavernous carotid arteries bilaterally with irregularity, but no significant stenoses of greater than 50%.  The left A1 segment is hypoplastic. The A2 segments are triplicated, a normal variant.  The ACA bifurcations are within normal limits.  There is significant segmental attenuation of anterior MCA branch vessels bilaterally, worse on the right.  Distal segmental ACA stenoses are present as well.  The right vertebral artery feeds the basilar.  There is a stump of the distal left vertebral artery, compatible with occlusion.  The right PICA origin is visualized and within normal limits.  The posterior cerebral arteries both originate from the basilar tip.  A left posterior communicating artery is patent.  There is segmental irregularity of distal branch vessels.  The dural sinuses are patent.   Review of the MIP images confirms the above findings.  IMPRESSION:  1.  Moderate small vessel disease. 2.  Atherosclerotic calcifications within the cavernous carotid arteries bilaterally without significant stenosis. 3.  Hypoplastic right A1 segment. 4.  Occluded distal left vertebral artery.  The basilar artery is fed from the dominant right vertebral artery. 5.  Small left posterior communicating artery. 6.  Stable appearance of bilateral lacunar infarcts of the basal ganglia and left upper brain stem.  Original Report Authenticated By: Jamesetta Orleans. MATTERN, M.D.   US Renal  09/19/2011  *RADIOLOGY REPORT*  Clinical Data: Chronic renal failure, evaluate for obstruction   RENAL/URINARY TRACT ULTRASOUND COMPLETE  Comparison:  CT angio abdomen pelvis of 07/15/2011  Findings:  Right Kidney:  No hydronephrosis is seen.  The right kidney measures 10.9 cm sagittally.  The echogenicity of the right kidney is within normal limits.  Left Kidney:  No hydronephrosis is noted.  The left kidney measures 11.2 cm, and the parenchyma also is normal in echogenicity.  Bladder:  The urinary bladder is unremarkable.  The prostate measures 4.7 cm in maximum diameter.  IMPRESSION: No hydronephrosis.  Original Report Authenticated By: Juline Patch, M.D.  Ct Head W Wo Contrast  09/11/2011  *RADIOLOGY REPORT*  Clinical Data: Memory loss.  Confusion.  Lethargy.  Unsteady gait. Coronary artery disease.  CT HEAD WITHOUT AND WITH CONTRAST  Technique:  Contiguous axial images were obtained from the base of the skull through the vertex without and with intravenous contrast.  Contrast: 80mL OMNIPAQUE IOHEXOL 300 MG/ML  SOLN  Comparison: None.  Findings: Multiple lacunar infarcts are present within the basal ganglia.  Some of the views are clearly chronic.  There is a lesion in the left caudate head and another in the right thalamus which are age indeterminate.  Mild periventricular subcortical white matter hypoattenuation is present otherwise.  The postcontrast images demonstrate to a no pathologic enhancement. The ventricles are of normal size.  No significant extra-axial fluid collection is present.  The paranasal sinuses and mastoid air cells are clear.  IMPRESSION:  1.  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  Although several are clearly remote, others are age indeterminate. MRI is more sensitive and specific for the evaluation of acute ischemia if the patient can tolerate an MRI. 2.  Mild generalized white matter disease likely reflects the sequelae of chronic microvascular ischemia.  Original Report Authenticated By: Jamesetta Orleans. MATTERN, M.D.  Cardiac Cath:  Nathan [name].  71 y.o.    male  11-03-1940  Procedure Date: 09/04/2011  Referring Physician: [facility]  Primary Cardiologist:: Tiburcio Pea, III  PROCEDURE: Left heart catheterization with selective coronary angiography, left ventriculogram.  INDICATIONS: Nuclear study performed at the [facility] demonstrating 3 zone myocardial ischemia with a large inferior and lateral wall defect and smaller mid anterior and apical ischemic zones.  The risks, benefits, and details of the procedure were explained to the patient. The patient verbalized understanding and wanted to proceed. Informed written consent was obtained.  PROCEDURE TECHNIQUE: After Xylocaine anesthesia a 5 French sheath was placed in the right femoral artery with a single anterior needle wall stick. Coronary angiography was done using a 5 [place] A2MP catheter. Left ventriculography was done using a 5 [place] A2 MP catheter.  CONTRAST: Total of 70 cc.  COMPLICATIONS: None.  HEMODYNAMICS: Aortic pressure was 144/68 mmHg; LV pressure was 146/19 mmHg; LVEDP 29 mmHg. There was no gradient between the left ventricle and aorta.  ANGIOGRAPHIC DATA: The left main coronary artery is widely patent..  The left anterior descending artery is heavy calcification in the proximal to midsegment. Within this region there is obstruction in the 60-75% range. The LAD is large and wraps around the left ventricular apex. The second diagonal which is large contains ostial 50-70% stenosis..  The left circumflex artery is totally occluded after the first obtuse marginal. Proximal to the first marginal there is a 95% stenosis. The first marginal is small to moderate in size. 2 distal marginals fill late via left to left and right-to-left collaterals..  The right coronary artery is codominant with segmental 90% stenosis proximal and mid over a long segment. This vessel gives origin to left circumflex collaterals.Marland Kitchen  LEFT VENTRICULOGRAM: Left ventricular angiogram was done in the 30 RAO  projection and revealed normal left ventricular wall motion and systolic function with an estimated ejection fraction of 50%.  IMPRESSIONS: 1. Significant 3 vessel coronary artery disease with circumflex total occlusion, high-grade diffuse obstruction in the RCA, and borderline significant mid LAD obstruction.  2. Left ventricular ejection fraction 50-55% with moderate inferobasal hypokinesis/akinesis.  3. Nuclear myocardial perfusion study demonstrating 3 zone ischemia including the mid and apical LAD territory.  RECOMMENDATION:  1. Will have the patient seen by TCTS to determine the risk of surgical revascularization. Will also discuss with the patient percutaneous approaches including right coronary and LAD stenting. We would not attempt circumflex PCI.  2. Aggressive hydration today with discharge later this evening.Ronny Flurry: PROCEDURE: L Pelvic angiogram. Selective left lower extremity angiogram. Selective right lower extremity angiogram. .  INDICATIONS: Claudication or  The risks, benefits, and details of the procedure were explained to the patient. The patient verbalized understanding and wanted to proceed. Informed written consent was obtained.  PROCEDURE TECHNIQUE: Right femoral access was obtained by Dr. Katrinka Blazing for cardiac catheterization. A pigtail catheter was advanced to the aortic bifurcation and a power injection of contrast was performed in the AP projection. We obtained access to the contralateral iliac system using an Omni flush catheter and Versa core wire. An endhole catheter was advanced to the left external iliac artery. A power injection of contrast was performed to the endhole catheter to image the left lower extremity vasculature. Through the right femoral sheath, a power injection of contrast was performed to visualize the right lower extremity vasculature.  CONTRAST: Total of 75 cc.  COMPLICATIONS: None.  ANGIOGRAPHIC DATA: The right  common iliac artery is heavily  calcified. The stent in the right common iliac artery does appear patent. The right internal iliac artery is occluded and fills by collaterals from the inferior mesenteric artery.. The right external iliac artery is widely patent. The right common femoral artery is patent. There is mild atherosclerosis. The right profunda femoral artery is widely patent. The right SFA has mild to moderate diffuse atherosclerosis. In the mid to distal vessel there is a focal 40% stenosis. The right popliteal artery is widely patent. There is mild atherosclerosis noted in the tibial vessels. All 3 tibial vessels are patent.  The left common iliac artery has moderate disease proximally. The left common iliac artery stent is patent with 25% mid stent in-stent restenosis. The left internal iliac artery is occluded. This appears to fill by collaterals from the IMA. The left external iliac artery is patent. The left common femoral artery appears patent. The left profunda femoral artery is large. The left superficial femoral artery is occluded at the ostium. The left SFA reconstitutes in the midportion from collaterals from the left profunda femoral artery. The distal SFA is patent with only mild atherosclerosis. The left popliteal artery is widely patent. All 3 vessels are patent below the knee.  IMPRESSIONS:  1. Patent common iliac stents bilaterally. Occluded internal iliac arteries bilaterally.  2. Occluded left SFA from the proximal portion to the midportion. Collaterals from the left profunda femoral artery. Three-vessel runoff below the left knee.  3. Mild to moderate diffuse disease in the right SFA. Three-vessel runoff below the right knee.  RECOMMENDATION: He'll need management of his coronary artery disease per Dr. Katrinka Blazing. After this, he will followup with Dr. Myra Gianotti for further evaluation of his claudication. I encouraged him to stop smoking. He needs to try to continue walking to build circulation in his legs. He feels  that he has failed medical therapy and I don't think he has any to percutaneous options. He is willing to consider left femoropopliteal bypass. Continue aspirin and lipid-lowering therapy.        Recent Lab Findings: No results found for this basename: WBC,  HGB,  HCT,  PLT,  GLUCOSE,  CHOL,  TRIG,  HDL,  LDLDIRECT,  LDLCALC,  ALT,  AST,  NA,  K,  CL,  CREATININE,  BUN,  CO2,  TSH,  INR,  GLUF,  HGBA1C      Assessment / Plan:   1. Three vessel coronary artery occlusive disease, with postive stress test 2 COPD, long term tobacco use 3 Limiting claudication left greater then right with sever peripheral vascular disease 4 Hypertension 5 Long term alcohol heavy use 6 Six month history of mental status changes per patients wife, with bilateral carotid bruits, r/o stroke 7  Multiple lacunar infarcts of the basal ganglia. By CT of head this week 8 Chronic Renal disease stage 3 reported from lab test done at [facility] Cr 1.8 9  Greater then 80 % right distal internal carotid stenosis, with evidence of bilateral lacunar infarcts and confirmed on CTA with disease 2 cm above the bifurcation of the carotid on the right  I discussed the case with Dr. Myra Gianotti who is reviewed the carotid Doppler studies, and seen the patient and has recommended proceeding with concomitant right carotid endarterectomy simultaneously with coronary artery bypass graft. We tentatively plan this for June 21.  The goals risks and alternatives of the planned surgical procedure coronary artery bypass grafting and carotid endarterectomy have been discussed with the patient  in detail. The risks of the procedure including death, infection, stroke, myocardial infarction, bleeding, blood transfusion have all been discussed specifically.  I have quoted [name] a 5 % of perioperative mortality and a complication rate as high as 40%. The patient's questions have been answered.Nathan [name] is willing  to proceed with the planned  procedure.   Delight Ovens MD  Beeper [phone] Office [phone] 10/23/2011 4:49 PM

## 2011-10-23 NOTE — Patient Instructions (Addendum)
Coronary Artery Bypass Grafting Coronary artery bypass grafting (CABG) is done to bypass or fix arteries of the heart (coronary) that have become narrow or blocked. This is usually the result of plaque built up in the walls of the vessels. The coronary arteries supply the heart with the oxygen and nutrients it needs to pump blood to your body. The heart never rests and needs constant blood flow. If an artery is partially blocked, you may have chest pain (angina). Lack of blood flow to part of the heart muscle may cause that part to die. This is what happens in a heart attack (myocardial infarction). Reasons for CABG include:  Arteries that cannot be treated with medications or other interventions (such as a heart stent).   Severe angina not responsive to other treatment.   Improving heart function.   Treating a heart attack.  Every person is unique. Be sure you understand the risks and benefits of CABG.  RISKS AND COMPLICATIONS Your surgeon will discuss these with you. Your risks will be different depending on your past and present health and other factors. It may be helpful to have a family member or advocate with you so you feel free to ask questions and get the answers you need to give an informed consent. Possible problems of CABG surgery include:  Blood loss and replacement.   Stroke.   Infection.   Surgical site pain.   Heart attack during or after.   Kidney failure.  BEFORE THE PROCEDURE  Tell your doctor about any allergies, medicines, bleeding problems and other surgeries.   Take all medicines exactly as directed. You may start new medicines and stop taking others. Do not stop medications or adjust dosages on your own. Continue taking the medications up until the time of surgery.   Perform only activities that are suggested by your caregiver.   If you are overweight, you should try to lose weight. Eat a heart-healthy diet that is low in fat and salt.   If you smoke,  quit.   Let your caregiver know if you have been on steroids (including creams or drops) for long periods of time. This is critical.   If you are diabetic discuss with your doctor whether or not you should take insulin the day of your surgery.  DAY OF SURGERY:  Plan to arrive 60 minutes before the scheduled time or as directed.   Do not eat or drink anything, including medicine, unless directed.   You will sign a written consent. You may have blood work and other tests done.   Your family will be shown where they can wait for the surgeon to talk to them when the surgery is over.  PROCEDURE Only a specially trained surgeon does a CABG assisted by a team of other health care professionals. You will be asleep and not feel any discomfort during the surgery.    Traditional method:   A cut (incision) is made down the front of the chest through the breastbone (sternum).   The sternum is spread open so the surgeon can see your heart.   You are connected to a machine that does the work of your heart and lungs and your heart stops beating.   Veins are taken from your leg(s) and used to bypass the blocked arteries of your heart. Sometimes an artery from inside your chest wall is used, either by itself or along with leg veins.   When the bypasses are done, you are taken off the machine,   and your heart is restarted and takes over again.   Alternate methods:   The heart lung machine is not used. This is called off-pump. Your heart continues to beat while the bypasses are done.   Smaller incisions are used instead of going through the middle of the chest (minimally invasive).   Intended to reduce pain and promote a faster recovery.  AFTER THE PROCEDURE  You may wake up with a tube in your throat to help your breathing. You may be connected to a breathing machine.   You will not be able to talk while the tube is in place. Try not to fight against it. The tube will be taken out as soon as it  is safe.   Even if you cannot see them, there are nurses nearby who are watching everything. You are not alone.   Your family should be prepared to see you with many tubes and wires. You will be sleepy and pale. Your family can hold your hand and speak to you, but you may have no memory of this time.  SEEK IMMEDIATE MEDICAL CARE IF:  You have severe chest pain, especially if the pain is crushing or pressure-like and spreads to the arms, back, neck, or jaw, or if you have sweating, feel sick to your stomach (nausea), or shortness of breath. THIS IS AN EMERGENCY. Do not wait to see if the pain will go away. Get medical help at once. Call your local emergency services (911 in [place] DO NOT drive yourself to the hospital.   You have an attack of chest pain lasting longer than usual, despite rest and treatment with the medications your doctor has prescribed.   You wake from sleep with chest pain.   You feel dizzy or faint.  Document Released: 02/05/2005 Document Revised: 04/17/2011 Document Reviewed: 10/13/2007 Exit[employer] Patient Information 2012 [employer].Coronary Artery Bypass Grafting Care After Refer to this sheet in the next few weeks. These instructions provide you with information on caring for yourself after your procedure. Your caregiver may also give you more specific instructions. Your treatment has been planned according to current medical practices, but problems sometimes occur. Call your caregiver if you have any problems or questions after your procedure.   Recovery from open heart surgery will be different for everyone. Some people feel well after 3 or 4 weeks, while for others it takes longer. After heart surgery, it may be normal to:  Not have an appetite, feel nauseated by the smell of food, or only want to eat a small amount.   Be constipated because of changes in your diet, activity, and medicines. Eat foods high in fiber. Add fresh fruits and vegetables to your diet. Stool  softeners may be helpful.   Feel sad or unhappy. You may be frustrated or cranky. You may have good days and bad days. Do not give up. Talk to your caregiver if you do not feel better.   Feel weakness and fatigue. You many need physical therapy or cardiac rehabilitation to get your strength back.   Develop an irregular heartbeat called atrial fibrillation. Symptoms of atrial fibrillation are a fast, irregular heartbeat or feelings of fluttery heartbeats, shortness of breath, low blood pressure, and dizziness. If these symptoms develop, see your caregiver right away.  MEDICATION  Have a list of all the medicines you will be taking when you leave the hospital. For every medicine, know the following:   Name.   Exact dose.   Time of day   to be taken.   How often it should be taken.   Why you are taking it.   Ask which medicines should or should not be taken together. If you take more than one heart medicine, ask if it is okay to take them together. Some heart medicines should not be taken at the same time because they may lower your blood pressure too much.   Narcotic pain medicine can cause constipation. Eat fresh fruits and vegetables. Add fiber to your diet. Stool softener medicine may help relieve constipation.   Keep a copy of your medicines with you at all times.   Do not add or stop taking any medicine until you check with your caregiver.   Medicines can have side effects. Call your caregiver who prescribed the medicine if you:   Start throwing up, have diarrhea, or have stomach pain.   Feel dizzy or lightheaded when you stand up.   Feel your heart is skipping beats or is beating too fast or too slow.   Develop a rash.   Notice unusual bruising or bleeding.  HOME CARE INSTRUCTIONS  After heart surgery, it is important to learn how to take your pulse. Have your caregiver show you how to take your pulse.   Use your incentive spirometer. Ask your caregiver how long after  surgery you need to use it.  Care of your chest incision  Tell your caregiver right away if you notice clicking in your chest (sternum).   Support your chest with a pillow or your arms when you take deep breaths and cough.   Follow your caregiver's instructions about when you can bathe or swim.   Protect your incision from sunlight during the first year to keep the scar from getting dark.   Tell your caregiver if you notice:   Increased tenderness of your incision.   Increased redness or swelling around your incision.   Drainage or pus from your incision.  Care of your leg incision(s)  Avoid crossing your legs.   Avoid sitting for long periods of time. Change positions every half hour.   Elevate your leg(s) when you are sitting.   Check your leg(s) daily for swelling. Check the incisions for redness or drainage.   Wear your elastic stockings as told by your caregiver. Take them off at bedtime.  Diet  Diet is very important to heart health.   Eat plenty of fresh fruits and vegetables. Meats should be lean cut. Avoid canned, processed, and fried foods.   Talk to a dietician. They can teach you how to make healthy food and drink choices.  Weight  Weigh yourself every day. This is important because it helps to know if you are retaining fluid that may make your heart and lungs work harder.   Use the same scale each time.   Weigh yourself every morning at the same time. You should do this after you go to the bathroom, but before you eat breakfast.   Your weight will be more accurate if you do not wear any clothes.   Record your weight.   Tell your caregiver if you have gained 2 pounds or more overnight.  Activity Stop any activity at once if you have chest pain, shortness of breath, irregular heartbeats, or dizziness. Get help right away if you have any of these symptoms.  Bathing.  Avoid soaking in a bath or hot tub until your incisions are healed.   Rest. You need a  balance of rest and   activity.   Exercise. Exercise per your caregiver's advice. You may need physical therapy or cardiac rehabilitation to help strengthen your muscles and build your endurance.   Climbing stairs. Unless your caregiver tells you not to climb stairs, go up stairs slowly and rest if you tire. Do not pull yourself up by the handrail.   Driving a car. Follow your caregiver's advice on when you may drive. You may ride as a passenger at any time. When traveling for long periods of time in a car, get out of the car and walk around for a few minutes every 2 hours.   Lifting. Avoid lifting, pushing, or pulling anything heavier than 10 pounds for 6 weeks after surgery or as told by your caregiver.   Returning to work. Check with your caregiver. People heal at different rates. Most people will be able to go back to work 6 to 12 weeks after surgery.   Sexual activity. You may resume sexual relations as told by your caregiver.  SEEK MEDICAL CARE IF:  Any of your incisions are red, painful, or have any type of drainage coming from them.   You have an oral temperature above 102 F (38.9 C).   You have ankle or leg swelling.   You have pain in your legs.   You have weight gain of 2 or more pounds a day.   You feel dizzy or lightheaded when you stand up.  SEEK IMMEDIATE MEDICAL CARE IF:  You have angina or chest pain that goes to your jaw or arms. Call your local emergency services right away.   You have shortness of breath at rest or with activity.   You have a fast or irregular heartbeat (arrhythmia).   There is a "clicking" in your sternum when you move.   You have numbness or weakness in your arms or legs.  MAKE SURE YOU:  Understand these instructions.   Will watch your condition.   Will get help right away if you are not doing well or get worse.  Document Released: 11/15/2004 Document Revised: 04/17/2011 Document Reviewed: 07/03/2010 Exit[employer] Patient Information  2012 [employer]. 

## 2011-10-27 ENCOUNTER — Other Ambulatory Visit (HOSPITAL_COMMUNITY): Payer: Medicare PPO

## 2011-10-29 ENCOUNTER — Ambulatory Visit (HOSPITAL_COMMUNITY): Payer: Medicare PPO

## 2011-10-29 ENCOUNTER — Other Ambulatory Visit (HOSPITAL_COMMUNITY): Payer: Medicare PPO

## 2011-10-29 ENCOUNTER — Encounter (HOSPITAL_COMMUNITY)
Admission: RE | Admit: 2011-10-29 | Discharge: 2011-10-29 | Disposition: A | Discharge status: Home / Self Care | Payer: Medicare PPO | Source: Ambulatory Visit | Attending: Cardiothoracic Surgery | Admitting: Cardiothoracic Surgery

## 2011-10-29 ENCOUNTER — Encounter (HOSPITAL_COMMUNITY): Payer: Medicare PPO

## 2011-10-29 ENCOUNTER — Encounter (HOSPITAL_COMMUNITY): Payer: Self-pay

## 2011-10-29 VITALS — BP 134/68 | HR 57 | Temp 96.9°F | Resp 18 | Ht 73.0 in | Wt 201.2 lb

## 2011-10-29 DIAGNOSIS — I2510 Atherosclerotic heart disease of native coronary artery without angina pectoris: Secondary | ICD-10-CM

## 2011-10-29 HISTORY — DX: Gout, unspecified: M10.9

## 2011-10-29 HISTORY — DX: Gastro-esophageal reflux disease without esophagitis: K21.9

## 2011-10-29 LAB — CBC
HCT: 41.3 % (ref 39.0–52.0)
Hemoglobin: 13.9 g/dL (ref 13.0–17.0)
MCH: 30 pg (ref 26.0–34.0)
MCHC: 33.7 g/dL (ref 30.0–36.0)
MCV: 89 fL (ref 78.0–100.0)
Platelets: 153 10*3/uL (ref 150–400)
RBC: 4.64 MIL/uL (ref 4.22–5.81)
RDW: 15.5 % (ref 11.5–15.5)
WBC: 10.2 10*3/uL (ref 4.0–10.5)

## 2011-10-29 LAB — URINALYSIS, ROUTINE W REFLEX MICROSCOPIC
Bilirubin Urine: NEGATIVE
Glucose, UA: NEGATIVE mg/dL
Hgb urine dipstick: NEGATIVE
Ketones, ur: NEGATIVE mg/dL
Leukocytes, UA: NEGATIVE
Nitrite: NEGATIVE
Protein, ur: NEGATIVE mg/dL
Specific Gravity, Urine: 1.016 (ref 1.005–1.030)
Urobilinogen, UA: 0.2 mg/dL (ref 0.0–1.0)
pH: 6 (ref 5.0–8.0)

## 2011-10-29 LAB — COMPREHENSIVE METABOLIC PANEL
ALT: 11 U/L (ref 0–53)
AST: 13 U/L (ref 0–37)
Albumin: 3.5 g/dL (ref 3.5–5.2)
Alkaline Phosphatase: 66 U/L (ref 39–117)
BUN: 20 mg/dL (ref 6–23)
CO2: 22 mEq/L (ref 19–32)
Calcium: 9.4 mg/dL (ref 8.4–10.5)
Chloride: 104 mEq/L (ref 96–112)
Creatinine, Ser: 1.52 mg/dL — ABNORMAL HIGH (ref 0.50–1.35)
GFR calc Af Amer: 51 mL/min — ABNORMAL LOW (ref >90)
GFR calc non Af Amer: 44 mL/min — ABNORMAL LOW (ref >90)
Glucose, Bld: 111 mg/dL — ABNORMAL HIGH (ref 70–99)
Potassium: 4.3 mEq/L (ref 3.5–5.1)
Sodium: 136 mEq/L (ref 135–145)
Total Bilirubin: 0.3 mg/dL (ref 0.3–1.2)
Total Protein: 6.7 g/dL (ref 6.0–8.3)

## 2011-10-29 LAB — BLOOD GAS, ARTERIAL
Acid-base deficit: 0.2 mmol/L (ref 0.0–2.0)
Bicarbonate: 24 mEq/L (ref 20.0–24.0)
Drawn by: 344381
FIO2: 0.21 %
O2 Saturation: 96.9 %
Patient temperature: 98.6
TCO2: 25.3 mmol/L (ref 0–100)
pCO2 arterial: 40.3 mmHg (ref 35.0–45.0)
pH, Arterial: 7.393 (ref 7.350–7.450)
pO2, Arterial: 90.6 mmHg (ref 80.0–100.0)

## 2011-10-29 LAB — PROTIME-INR
INR: 1.02 (ref 0.00–1.49)
Prothrombin Time: 13.6 seconds (ref 11.6–15.2)

## 2011-10-29 LAB — SURGICAL PCR SCREEN
MRSA, PCR: NEGATIVE
Staphylococcus aureus: NEGATIVE

## 2011-10-29 LAB — TYPE AND SCREEN
ABO/RH(D): O POS
Antibody Screen: NEGATIVE

## 2011-10-29 LAB — HEMOGLOBIN A1C
Hgb A1c MFr Bld: 5.3 % (ref <5.7)
Mean Plasma Glucose: 105 mg/dL (ref <117)

## 2011-10-29 LAB — APTT: aPTT: 32 seconds (ref 24–37)

## 2011-10-29 LAB — ABO/RH: ABO/RH(D): O POS

## 2011-10-29 NOTE — Pre-Procedure Instructions (Signed)
20 Nathan [name]  10/29/2011    Your procedure is scheduled on:  Friday June 21  Report to [facility] at 5:30 AM.  Call this number if you have problems the morning of surgery: [phone]   Remember:   Do not eat or drink:After Midnight.    Clear liquids include soda, tea, black coffee, apple or grape juice, broth.  Take these medicines the morning of surgery with A SIP OF WATER: amlodipine (Norvasc), buproprion (Wellbutrin), isosorbide (Imdur), metoprolol (Lopressor), omeprazole (Prilosec), gout meds  STOP: NSAIDS - Aspirin, Aleve, Motrin - 7 days before surgery   Do not wear jewelry, make-up or nail polish.  Do not wear lotions, powders, or perfumes. You may wear deodorant.  Do not shave 48 hours prior to surgery. Men may shave face and neck.  Do not bring valuables to the hospital.  Contacts, dentures or bridgework may not be worn into surgery.  Leave suitcase in the car. After surgery it may be brought to your room.  For patients admitted to the hospital, checkout time is 11:00 AM the day of discharge.   Patients discharged the day of surgery will not be allowed to drive home.  Name and phone number of your driver: NA  Special Instructions: Incentive Spirometry - Practice and bring it with you on the day of surgery. and CHG Shower Use Special Wash: 1/2 bottle night before surgery and 1/2 bottle morning of surgery.   Please read over the following fact sheets that you were given: Pain Booklet, Coughing and Deep Breathing, Blood Transfusion Information, Open Heart Packet and Surgical Site Infection Prevention

## 2011-10-30 MED ORDER — DEXTROSE 5 % IV SOLN
750.0000 mg | INTRAVENOUS | Status: DC
  Filled 2011-10-30 (×2): qty 750

## 2011-10-30 MED ORDER — TRANEXAMIC ACID (OHS) PUMP PRIME SOLUTION
2.0000 mg/kg | INTRAVENOUS | Status: DC
  Filled 2011-10-30: qty 1.83

## 2011-10-30 MED ORDER — DOPAMINE-DEXTROSE 3.2-5 MG/ML-% IV SOLN
2.0000 ug/kg/min | INTRAVENOUS | Status: AC
  Administered 2011-10-31: 3 ug/kg/min via INTRAVENOUS
  Filled 2011-10-30 (×2): qty 250

## 2011-10-30 MED ORDER — PHENYLEPHRINE HCL 10 MG/ML IJ SOLN
30.0000 ug/min | INTRAVENOUS | Status: AC
  Administered 2011-10-31: 40 ug/min via INTRAVENOUS
  Filled 2011-10-30: qty 2

## 2011-10-30 MED ORDER — DEXTROSE 5 % IV SOLN
1.5000 g | INTRAVENOUS | Status: AC
  Administered 2011-10-31: .75 g via INTRAVENOUS
  Administered 2011-10-31: 1.5 g via INTRAVENOUS
  Filled 2011-10-30 (×2): qty 1.5

## 2011-10-30 MED ORDER — NITROGLYCERIN IN D5W 200-5 MCG/ML-% IV SOLN
2.0000 ug/min | INTRAVENOUS | Status: AC
  Administered 2011-10-31: 5 ug/min via INTRAVENOUS
  Filled 2011-10-30: qty 250

## 2011-10-30 MED ORDER — MAGNESIUM SULFATE 50 % IJ SOLN
40.0000 meq | INTRAMUSCULAR | Status: DC
  Filled 2011-10-30 (×2): qty 10

## 2011-10-30 MED ORDER — SODIUM CHLORIDE 0.9 % IV SOLN
INTRAVENOUS | Status: AC
  Administered 2011-10-31: 1.2 [IU]/h via INTRAVENOUS
  Filled 2011-10-30 (×2): qty 1

## 2011-10-30 MED ORDER — TRANEXAMIC ACID 100 MG/ML IV SOLN
1.5000 mg/kg/h | INTRAVENOUS | Status: AC
  Administered 2011-10-31: 1.5 mg/kg/h via INTRAVENOUS
  Filled 2011-10-30: qty 25

## 2011-10-30 MED ORDER — TRANEXAMIC ACID (OHS) BOLUS VIA INFUSION
15.0000 mg/kg | INTRAVENOUS | Status: AC
  Administered 2011-10-31: 1369.5 mg via INTRAVENOUS
  Filled 2011-10-30: qty 1370

## 2011-10-30 MED ORDER — VANCOMYCIN HCL 1000 MG IV SOLR
1500.0000 mg | INTRAVENOUS | Status: AC
  Administered 2011-10-31: 1500 mg via INTRAVENOUS
  Filled 2011-10-30: qty 1500

## 2011-10-30 MED ORDER — POTASSIUM CHLORIDE 2 MEQ/ML IV SOLN
80.0000 meq | INTRAVENOUS | Status: DC
  Filled 2011-10-30: qty 40

## 2011-10-30 MED ORDER — EPINEPHRINE HCL 1 MG/ML IJ SOLN
0.5000 ug/min | INTRAVENOUS | Status: DC
  Filled 2011-10-30 (×2): qty 4

## 2011-10-30 MED ORDER — METOPROLOL TARTRATE 12.5 MG HALF TABLET: 12.5000 mg | ORAL_TABLET | Freq: Once | ORAL | Status: DC

## 2011-10-30 MED ORDER — SODIUM BICARBONATE 8.4 % IV SOLN
INTRAVENOUS | Status: AC
  Administered 2011-10-31: 12:00:00
  Filled 2011-10-30 (×3): qty 2.5

## 2011-10-30 MED ORDER — SODIUM CHLORIDE 0.9 % IV SOLN
0.1000 ug/kg/h | INTRAVENOUS | Status: AC
  Administered 2011-10-31: .3 ug/kg/h via INTRAVENOUS
  Filled 2011-10-30 (×2): qty 4

## 2011-10-31 ENCOUNTER — Encounter (HOSPITAL_COMMUNITY): Payer: Self-pay | Admitting: *Deleted

## 2011-10-31 ENCOUNTER — Encounter (HOSPITAL_COMMUNITY)
Admission: RE | Disposition: A | Discharge status: Home Health | Payer: Self-pay | Source: Ambulatory Visit | Attending: Cardiothoracic Surgery

## 2011-10-31 ENCOUNTER — Inpatient Hospital Stay (HOSPITAL_COMMUNITY)
Admission: RE | Admit: 2011-10-31 | Discharge: 2011-11-16 | DRG: 235 | Disposition: A | Discharge status: Home Health | Payer: Medicare PPO | Source: Ambulatory Visit | Attending: Cardiothoracic Surgery | Admitting: Cardiothoracic Surgery

## 2011-10-31 ENCOUNTER — Encounter (HOSPITAL_COMMUNITY): Payer: Self-pay | Admitting: Anesthesiology

## 2011-10-31 ENCOUNTER — Inpatient Hospital Stay (HOSPITAL_COMMUNITY): Payer: Medicare PPO

## 2011-10-31 ENCOUNTER — Ambulatory Visit (HOSPITAL_COMMUNITY): Payer: Medicare PPO | Admitting: Anesthesiology

## 2011-10-31 DIAGNOSIS — I6529 Occlusion and stenosis of unspecified carotid artery: Secondary | ICD-10-CM | POA: Diagnosis present

## 2011-10-31 DIAGNOSIS — I2510 Atherosclerotic heart disease of native coronary artery without angina pectoris: Secondary | ICD-10-CM

## 2011-10-31 DIAGNOSIS — Z951 Presence of aortocoronary bypass graft: Secondary | ICD-10-CM

## 2011-10-31 DIAGNOSIS — I10 Essential (primary) hypertension: Secondary | ICD-10-CM

## 2011-10-31 DIAGNOSIS — K219 Gastro-esophageal reflux disease without esophagitis: Secondary | ICD-10-CM | POA: Diagnosis present

## 2011-10-31 DIAGNOSIS — D72829 Elevated white blood cell count, unspecified: Secondary | ICD-10-CM | POA: Diagnosis not present

## 2011-10-31 DIAGNOSIS — M109 Gout, unspecified: Secondary | ICD-10-CM

## 2011-10-31 DIAGNOSIS — I4892 Unspecified atrial flutter: Secondary | ICD-10-CM | POA: Diagnosis present

## 2011-10-31 DIAGNOSIS — F17200 Nicotine dependence, unspecified, uncomplicated: Secondary | ICD-10-CM | POA: Diagnosis present

## 2011-10-31 DIAGNOSIS — D62 Acute posthemorrhagic anemia: Secondary | ICD-10-CM | POA: Diagnosis not present

## 2011-10-31 DIAGNOSIS — J411 Mucopurulent chronic bronchitis: Secondary | ICD-10-CM | POA: Diagnosis present

## 2011-10-31 DIAGNOSIS — R5381 Other malaise: Secondary | ICD-10-CM | POA: Diagnosis not present

## 2011-10-31 DIAGNOSIS — J95821 Acute postprocedural respiratory failure: Secondary | ICD-10-CM | POA: Diagnosis present

## 2011-10-31 DIAGNOSIS — J449 Chronic obstructive pulmonary disease, unspecified: Secondary | ICD-10-CM | POA: Diagnosis present

## 2011-10-31 DIAGNOSIS — I4891 Unspecified atrial fibrillation: Secondary | ICD-10-CM | POA: Diagnosis not present

## 2011-10-31 DIAGNOSIS — I252 Old myocardial infarction: Secondary | ICD-10-CM

## 2011-10-31 DIAGNOSIS — E785 Hyperlipidemia, unspecified: Secondary | ICD-10-CM | POA: Diagnosis present

## 2011-10-31 DIAGNOSIS — J9600 Acute respiratory failure, unspecified whether with hypoxia or hypercapnia: Secondary | ICD-10-CM | POA: Diagnosis not present

## 2011-10-31 DIAGNOSIS — N179 Acute kidney failure, unspecified: Secondary | ICD-10-CM

## 2011-10-31 DIAGNOSIS — Z8673 Personal history of transient ischemic attack (TIA), and cerebral infarction without residual deficits: Secondary | ICD-10-CM

## 2011-10-31 DIAGNOSIS — N183 Chronic kidney disease, stage 3 unspecified: Secondary | ICD-10-CM | POA: Diagnosis not present

## 2011-10-31 DIAGNOSIS — Z9889 Other specified postprocedural states: Secondary | ICD-10-CM

## 2011-10-31 DIAGNOSIS — I129 Hypertensive chronic kidney disease with stage 1 through stage 4 chronic kidney disease, or unspecified chronic kidney disease: Secondary | ICD-10-CM | POA: Diagnosis present

## 2011-10-31 DIAGNOSIS — J4489 Other specified chronic obstructive pulmonary disease: Secondary | ICD-10-CM | POA: Diagnosis present

## 2011-10-31 DIAGNOSIS — R1310 Dysphagia, unspecified: Secondary | ICD-10-CM | POA: Diagnosis not present

## 2011-10-31 DIAGNOSIS — E7800 Pure hypercholesterolemia, unspecified: Secondary | ICD-10-CM | POA: Diagnosis present

## 2011-10-31 HISTORY — PX: CORONARY ARTERY BYPASS GRAFT: SHX141

## 2011-10-31 HISTORY — PX: ENDARTERECTOMY: SHX5162

## 2011-10-31 HISTORY — PX: CAROTID ENDARTERECTOMY: SUR193

## 2011-10-31 LAB — POCT I-STAT, CHEM 8
BUN: 18 mg/dL (ref 6–23)
Calcium, Ion: 1.23 mmol/L (ref 1.12–1.32)
Chloride: 106 mEq/L (ref 96–112)
Creatinine, Ser: 1.5 mg/dL — ABNORMAL HIGH (ref 0.50–1.35)
Glucose, Bld: 125 mg/dL — ABNORMAL HIGH (ref 70–99)
HCT: 30 % — ABNORMAL LOW (ref 39.0–52.0)
Hemoglobin: 10.2 g/dL — ABNORMAL LOW (ref 13.0–17.0)
Potassium: 4.7 mEq/L (ref 3.5–5.1)
Sodium: 142 mEq/L (ref 135–145)
TCO2: 21 mmol/L (ref 0–100)

## 2011-10-31 LAB — CBC
HCT: 32.5 % — ABNORMAL LOW (ref 39.0–52.0)
HCT: 31.1 % — ABNORMAL LOW (ref 39.0–52.0)
Hemoglobin: 10.7 g/dL — ABNORMAL LOW (ref 13.0–17.0)
Hemoglobin: 10.6 g/dL — ABNORMAL LOW (ref 13.0–17.0)
MCH: 28.8 pg (ref 26.0–34.0)
MCH: 29.7 pg (ref 26.0–34.0)
MCHC: 34.1 g/dL (ref 30.0–36.0)
MCV: 87.6 fL (ref 78.0–100.0)
MCV: 87.1 fL (ref 78.0–100.0)
Platelets: 89 10*3/uL — ABNORMAL LOW (ref 150–400)
RBC: 3.71 MIL/uL — ABNORMAL LOW (ref 4.22–5.81)
RBC: 3.57 MIL/uL — ABNORMAL LOW (ref 4.22–5.81)
RDW: 15.4 % (ref 11.5–15.5)
WBC: 11.4 10*3/uL — ABNORMAL HIGH (ref 4.0–10.5)

## 2011-10-31 LAB — POCT I-STAT 3, ART BLOOD GAS (G3+)
Acid-base deficit: 1 mmol/L (ref 0.0–2.0)
Acid-base deficit: 2 mmol/L (ref 0.0–2.0)
Bicarbonate: 25.2 mEq/L — ABNORMAL HIGH (ref 20.0–24.0)
Bicarbonate: 24.2 mEq/L — ABNORMAL HIGH (ref 20.0–24.0)
Bicarbonate: 23.3 mEq/L (ref 20.0–24.0)
O2 Saturation: 100 %
O2 Saturation: 100 %
O2 Saturation: 99 %
Patient temperature: 36.3
TCO2: 27 mmol/L (ref 0–100)
TCO2: 26 mmol/L (ref 0–100)
TCO2: 24 mmol/L (ref 0–100)
pCO2 arterial: 44.4 mmHg (ref 35.0–45.0)
pCO2 arterial: 44 mmHg (ref 35.0–45.0)
pCO2 arterial: 40 mmHg (ref 35.0–45.0)
pH, Arterial: 7.362 (ref 7.350–7.450)
pH, Arterial: 7.349 — ABNORMAL LOW (ref 7.350–7.450)
pH, Arterial: 7.37 (ref 7.350–7.450)
pO2, Arterial: 347 mmHg — ABNORMAL HIGH (ref 80.0–100.0)
pO2, Arterial: 250 mmHg — ABNORMAL HIGH (ref 80.0–100.0)
pO2, Arterial: 136 mmHg — ABNORMAL HIGH (ref 80.0–100.0)

## 2011-10-31 LAB — HEMOGLOBIN AND HEMATOCRIT, BLOOD
HCT: 29.3 % — ABNORMAL LOW (ref 39.0–52.0)
Hemoglobin: 9.9 g/dL — ABNORMAL LOW (ref 13.0–17.0)

## 2011-10-31 LAB — POCT I-STAT 4, (NA,K, GLUC, HGB,HCT)
Glucose, Bld: 99 mg/dL (ref 70–99)
Glucose, Bld: 87 mg/dL (ref 70–99)
Glucose, Bld: 93 mg/dL (ref 70–99)
Glucose, Bld: 167 mg/dL — ABNORMAL HIGH (ref 70–99)
Glucose, Bld: 138 mg/dL — ABNORMAL HIGH (ref 70–99)
Glucose, Bld: 123 mg/dL — ABNORMAL HIGH (ref 70–99)
HCT: 39 % (ref 39.0–52.0)
HCT: 25 % — ABNORMAL LOW (ref 39.0–52.0)
HCT: 34 % — ABNORMAL LOW (ref 39.0–52.0)
HCT: 29 % — ABNORMAL LOW (ref 39.0–52.0)
HCT: 28 % — ABNORMAL LOW (ref 39.0–52.0)
HCT: 32 % — ABNORMAL LOW (ref 39.0–52.0)
Hemoglobin: 13.3 g/dL (ref 13.0–17.0)
Hemoglobin: 11.6 g/dL — ABNORMAL LOW (ref 13.0–17.0)
Hemoglobin: 8.5 g/dL — ABNORMAL LOW (ref 13.0–17.0)
Hemoglobin: 9.9 g/dL — ABNORMAL LOW (ref 13.0–17.0)
Hemoglobin: 9.5 g/dL — ABNORMAL LOW (ref 13.0–17.0)
Hemoglobin: 10.9 g/dL — ABNORMAL LOW (ref 13.0–17.0)
Potassium: 4.1 mEq/L (ref 3.5–5.1)
Potassium: 4.2 mEq/L (ref 3.5–5.1)
Potassium: 4.3 mEq/L (ref 3.5–5.1)
Potassium: 4.9 mEq/L (ref 3.5–5.1)
Potassium: 4.8 mEq/L (ref 3.5–5.1)
Potassium: 4.1 mEq/L (ref 3.5–5.1)
Sodium: 142 mEq/L (ref 135–145)
Sodium: 137 mEq/L (ref 135–145)
Sodium: 142 mEq/L (ref 135–145)
Sodium: 138 mEq/L (ref 135–145)
Sodium: 141 mEq/L (ref 135–145)
Sodium: 141 mEq/L (ref 135–145)

## 2011-10-31 LAB — MAGNESIUM: Magnesium: 2.8 mg/dL — ABNORMAL HIGH (ref 1.5–2.5)

## 2011-10-31 LAB — POCT I-STAT GLUCOSE
Glucose, Bld: 91 mg/dL (ref 70–99)
Glucose, Bld: 94 mg/dL (ref 70–99)
Glucose, Bld: 83 mg/dL (ref 70–99)
Operator id: 173792
Operator id: 173792
Operator id: 173792

## 2011-10-31 LAB — GLUCOSE, CAPILLARY
Glucose-Capillary: 108 mg/dL — ABNORMAL HIGH (ref 70–99)
Glucose-Capillary: 120 mg/dL — ABNORMAL HIGH (ref 70–99)
Glucose-Capillary: 119 mg/dL — ABNORMAL HIGH (ref 70–99)
Glucose-Capillary: 117 mg/dL — ABNORMAL HIGH (ref 70–99)

## 2011-10-31 LAB — CREATININE, SERUM
Creatinine, Ser: 1.47 mg/dL — ABNORMAL HIGH (ref 0.50–1.35)
GFR calc Af Amer: 53 mL/min — ABNORMAL LOW (ref >90)
GFR calc non Af Amer: 46 mL/min — ABNORMAL LOW (ref >90)

## 2011-10-31 LAB — PLATELET COUNT: Platelets: 122 10*3/uL — ABNORMAL LOW (ref 150–400)

## 2011-10-31 LAB — PROTIME-INR: Prothrombin Time: 19.1 seconds — ABNORMAL HIGH (ref 11.6–15.2)

## 2011-10-31 SURGERY — CORONARY ARTERY BYPASS GRAFTING (CABG)
Anesthesia: General | Site: Neck | Laterality: Right | Wound class: Clean

## 2011-10-31 MED ORDER — DOPAMINE-DEXTROSE 3.2-5 MG/ML-% IV SOLN: 0.0000 ug/kg/min | INTRAVENOUS | Status: DC

## 2011-10-31 MED ORDER — SUFENTANIL CITRATE 50 MCG/ML IV SOLN
INTRAVENOUS | Status: DC | PRN
  Administered 2011-10-31: 45 ug via INTRAVENOUS
  Administered 2011-10-31: 30 ug via INTRAVENOUS
  Administered 2011-10-31: 10 ug via INTRAVENOUS
  Administered 2011-10-31: 20 ug via INTRAVENOUS
  Administered 2011-10-31: 5 ug via INTRAVENOUS
  Administered 2011-10-31: 50 ug via INTRAVENOUS
  Administered 2011-10-31: 40 ug via INTRAVENOUS
  Administered 2011-10-31: 20 ug via INTRAVENOUS

## 2011-10-31 MED ORDER — SIMVASTATIN 10 MG PO TABS
10.0000 mg | ORAL_TABLET | Freq: Every day | ORAL | Status: DC
  Administered 2011-11-01 – 2011-11-03 (×3): 10 mg via ORAL
  Filled 2011-10-31 (×8): qty 1

## 2011-10-31 MED ORDER — SODIUM CHLORIDE 0.9 % IJ SOLN: 3.0000 mL | INTRAMUSCULAR | Status: DC | PRN

## 2011-10-31 MED ORDER — SODIUM CHLORIDE 0.45 % IV SOLN
INTRAVENOUS | Status: DC
  Administered 2011-10-31: 20 mL via INTRAVENOUS

## 2011-10-31 MED ORDER — HEMOSTATIC AGENTS (NO CHARGE) OPTIME
TOPICAL | Status: DC | PRN
  Administered 2011-10-31: 1 via TOPICAL

## 2011-10-31 MED ORDER — 0.9 % SODIUM CHLORIDE (POUR BTL) OPTIME
TOPICAL | Status: DC | PRN
  Administered 2011-10-31: 5000 mL

## 2011-10-31 MED ORDER — LEVALBUTEROL HCL 0.63 MG/3ML IN NEBU
0.6300 mg | INHALATION_SOLUTION | Freq: Three times a day (TID) | RESPIRATORY_TRACT | Status: DC
  Administered 2011-10-31 – 2011-11-01 (×3): 0.63 mg via RESPIRATORY_TRACT
  Filled 2011-10-31 (×5): qty 3

## 2011-10-31 MED ORDER — SODIUM CHLORIDE 0.9 % IJ SOLN
OROMUCOSAL | Status: DC | PRN
  Administered 2011-10-31: 11:00:00 via TOPICAL

## 2011-10-31 MED ORDER — PROTAMINE SULFATE 10 MG/ML IV SOLN
INTRAVENOUS | Status: DC | PRN
  Administered 2011-10-31: 50 mg via INTRAVENOUS

## 2011-10-31 MED ORDER — SODIUM CHLORIDE 0.9 % IV SOLN: 250.0000 mL | INTRAVENOUS | Status: DC

## 2011-10-31 MED ORDER — FAMOTIDINE IN NACL 20-0.9 MG/50ML-% IV SOLN
20.0000 mg | Freq: Two times a day (BID) | INTRAVENOUS | Status: AC
  Administered 2011-10-31 (×2): 20 mg via INTRAVENOUS
  Filled 2011-10-31: qty 50

## 2011-10-31 MED ORDER — MORPHINE SULFATE 2 MG/ML IJ SOLN: 1.0000 mg | INTRAMUSCULAR | Status: AC | PRN

## 2011-10-31 MED ORDER — OXYCODONE HCL 5 MG PO TABS
5.0000 mg | ORAL_TABLET | ORAL | Status: DC | PRN
  Administered 2011-11-01 – 2011-11-07 (×4): 10 mg via ORAL
  Filled 2011-10-31 (×4): qty 2

## 2011-10-31 MED ORDER — SODIUM CHLORIDE 0.9 % IJ SOLN
OROMUCOSAL | Status: DC | PRN
  Administered 2011-10-31: 15:00:00 via TOPICAL

## 2011-10-31 MED ORDER — BUPROPION HCL ER (SR) 150 MG PO TB12
150.0000 mg | ORAL_TABLET | Freq: Two times a day (BID) | ORAL | Status: DC
  Administered 2011-11-01 – 2011-11-07 (×8): 150 mg via ORAL
  Filled 2011-10-31 (×18): qty 1

## 2011-10-31 MED ORDER — CHLORHEXIDINE GLUCONATE 4 % EX LIQD: 30.0000 mL | CUTANEOUS | Status: DC

## 2011-10-31 MED ORDER — SODIUM CHLORIDE 0.9 % IV SOLN: INTRAVENOUS | Status: DC

## 2011-10-31 MED ORDER — VANCOMYCIN HCL IN DEXTROSE 1-5 GM/200ML-% IV SOLN
1000.0000 mg | Freq: Once | INTRAVENOUS | Status: AC
  Administered 2011-10-31: 1000 mg via INTRAVENOUS
  Filled 2011-10-31: qty 200

## 2011-10-31 MED ORDER — DEXTROSE 5 % IV SOLN
1.5000 g | Freq: Two times a day (BID) | INTRAVENOUS | Status: AC
  Administered 2011-10-31 – 2011-11-02 (×4): 1.5 g via INTRAVENOUS
  Filled 2011-10-31 (×4): qty 1.5

## 2011-10-31 MED ORDER — ASPIRIN 81 MG PO CHEW
324.0000 mg | CHEWABLE_TABLET | Freq: Every day | ORAL | Status: DC
  Administered 2011-11-02 – 2011-11-06 (×5): 324 mg
  Filled 2011-10-31 (×5): qty 4

## 2011-10-31 MED ORDER — BISACODYL 10 MG RE SUPP
10.0000 mg | Freq: Every day | RECTAL | Status: DC
  Administered 2011-11-02 – 2011-11-05 (×4): 10 mg via RECTAL
  Filled 2011-10-31 (×5): qty 1

## 2011-10-31 MED ORDER — SODIUM CHLORIDE 0.9 % IV SOLN
INTRAVENOUS | Status: DC
  Filled 2011-10-31: qty 1

## 2011-10-31 MED ORDER — PANTOPRAZOLE SODIUM 40 MG PO TBEC
40.0000 mg | DELAYED_RELEASE_TABLET | Freq: Every day | ORAL | Status: DC
  Filled 2011-10-31: qty 1

## 2011-10-31 MED ORDER — ONDANSETRON HCL 4 MG/2ML IJ SOLN: 4.0000 mg | Freq: Four times a day (QID) | INTRAMUSCULAR | Status: DC | PRN

## 2011-10-31 MED ORDER — SODIUM CHLORIDE 0.9 % IV SOLN
0.1000 ug/kg/h | INTRAVENOUS | Status: DC
  Filled 2011-10-31: qty 2

## 2011-10-31 MED ORDER — MIDAZOLAM HCL 5 MG/5ML IJ SOLN
INTRAMUSCULAR | Status: DC | PRN
  Administered 2011-10-31: 4 mg via INTRAVENOUS
  Administered 2011-10-31: 1 mg via INTRAVENOUS
  Administered 2011-10-31: 2 mg via INTRAVENOUS

## 2011-10-31 MED ORDER — PROPOFOL 10 MG/ML IV EMUL
INTRAVENOUS | Status: DC | PRN
  Administered 2011-10-31: 100 mg via INTRAVENOUS

## 2011-10-31 MED ORDER — ALLOPURINOL 300 MG PO TABS
300.0000 mg | ORAL_TABLET | Freq: Every day | ORAL | Status: DC
  Administered 2011-11-01 – 2011-11-07 (×7): 300 mg via ORAL
  Filled 2011-10-31 (×11): qty 1

## 2011-10-31 MED ORDER — ACETAMINOPHEN 500 MG PO TABS
1000.0000 mg | ORAL_TABLET | Freq: Four times a day (QID) | ORAL | Status: AC
  Administered 2011-11-01 – 2011-11-04 (×8): 1000 mg via ORAL
  Filled 2011-10-31 (×17): qty 2

## 2011-10-31 MED ORDER — SODIUM CHLORIDE 0.9 % IJ SOLN
3.0000 mL | Freq: Two times a day (BID) | INTRAMUSCULAR | Status: DC
  Administered 2011-11-01 – 2011-11-02 (×3): 3 mL via INTRAVENOUS
  Administered 2011-11-02: 10 mL via INTRAVENOUS
  Administered 2011-11-03 – 2011-11-09 (×8): 3 mL via INTRAVENOUS

## 2011-10-31 MED ORDER — ALBUMIN HUMAN 5 % IV SOLN
INTRAVENOUS | Status: DC | PRN
  Administered 2011-10-31 (×3): via INTRAVENOUS

## 2011-10-31 MED ORDER — POTASSIUM CHLORIDE 10 MEQ/50ML IV SOLN: 10.0000 meq | INTRAVENOUS | Status: AC

## 2011-10-31 MED ORDER — INSULIN REGULAR BOLUS VIA INFUSION
0.0000 [IU] | Freq: Three times a day (TID) | INTRAVENOUS | Status: DC
  Filled 2011-10-31: qty 10

## 2011-10-31 MED ORDER — COLCHICINE 0.6 MG PO TABS
0.6000 mg | ORAL_TABLET | Freq: Every day | ORAL | Status: DC
  Administered 2011-11-01 – 2011-11-03 (×3): 0.6 mg via ORAL
  Filled 2011-10-31 (×4): qty 1

## 2011-10-31 MED ORDER — HEPARIN SODIUM (PORCINE) 1000 UNIT/ML IJ SOLN
INTRAMUSCULAR | Status: DC | PRN
  Administered 2011-10-31: 30000 [IU] via INTRAVENOUS
  Administered 2011-10-31: 7000 [IU] via INTRAVENOUS
  Administered 2011-10-31: 1000 [IU] via INTRAVENOUS

## 2011-10-31 MED ORDER — METOPROLOL TARTRATE 1 MG/ML IV SOLN
2.5000 mg | INTRAVENOUS | Status: DC | PRN
  Administered 2011-11-09 – 2011-11-10 (×2): 5 mg via INTRAVENOUS
  Filled 2011-10-31 (×2): qty 5
  Filled 2011-10-31: qty 10

## 2011-10-31 MED ORDER — DOCUSATE SODIUM 100 MG PO CAPS
200.0000 mg | ORAL_CAPSULE | Freq: Every day | ORAL | Status: DC
  Administered 2011-11-01: 200 mg via ORAL
  Filled 2011-10-31: qty 2

## 2011-10-31 MED ORDER — INSULIN ASPART 100 UNIT/ML ~~LOC~~ SOLN
0.0000 [IU] | SUBCUTANEOUS | Status: DC
  Administered 2011-11-01: 2 [IU] via SUBCUTANEOUS
  Administered 2011-11-01: 12:00:00 via SUBCUTANEOUS
  Administered 2011-11-01 (×2): 2 [IU] via SUBCUTANEOUS
  Administered 2011-11-02: 12:00:00 via SUBCUTANEOUS
  Administered 2011-11-03 – 2011-11-07 (×7): 2 [IU] via SUBCUTANEOUS

## 2011-10-31 MED ORDER — ROCURONIUM BROMIDE 100 MG/10ML IV SOLN
INTRAVENOUS | Status: DC | PRN
  Administered 2011-10-31: 20 mg via INTRAVENOUS
  Administered 2011-10-31: 30 mg via INTRAVENOUS
  Administered 2011-10-31: 20 mg via INTRAVENOUS
  Administered 2011-10-31: 50 mg via INTRAVENOUS
  Administered 2011-10-31: 10 mg via INTRAVENOUS
  Administered 2011-10-31 (×2): 50 mg via INTRAVENOUS
  Administered 2011-10-31: 20 mg via INTRAVENOUS

## 2011-10-31 MED ORDER — METOPROLOL TARTRATE 25 MG/10 ML ORAL SUSPENSION
12.5000 mg | Freq: Two times a day (BID) | ORAL | Status: DC
  Administered 2011-11-01 – 2011-11-06 (×9): 12.5 mg
  Filled 2011-10-31 (×15): qty 5

## 2011-10-31 MED ORDER — METOPROLOL TARTRATE 12.5 MG HALF TABLET
12.5000 mg | ORAL_TABLET | Freq: Two times a day (BID) | ORAL | Status: DC
  Administered 2011-11-01: 12.5 mg via ORAL
  Filled 2011-10-31 (×15): qty 1

## 2011-10-31 MED ORDER — LACTATED RINGERS IV SOLN: 500.0000 mL | Freq: Once | INTRAVENOUS | Status: AC | PRN

## 2011-10-31 MED ORDER — ACETAMINOPHEN 160 MG/5ML PO SOLN
975.0000 mg | Freq: Four times a day (QID) | ORAL | Status: AC
  Administered 2011-11-02 – 2011-11-04 (×6): 975 mg
  Filled 2011-10-31 (×2): qty 20.3
  Filled 2011-10-31 (×4): qty 40.6

## 2011-10-31 MED ORDER — LACTATED RINGERS IV SOLN
INTRAVENOUS | Status: DC | PRN
  Administered 2011-10-31: 07:00:00 via INTRAVENOUS

## 2011-10-31 MED ORDER — ACETAMINOPHEN 650 MG RE SUPP
650.0000 mg | RECTAL | Status: AC
  Administered 2011-10-31: 650 mg via RECTAL

## 2011-10-31 MED ORDER — LACTATED RINGERS IV SOLN: INTRAVENOUS | Status: DC

## 2011-10-31 MED ORDER — ACETAMINOPHEN 160 MG/5ML PO SOLN: 650.0000 mg | ORAL | Status: AC

## 2011-10-31 MED ORDER — SODIUM CHLORIDE 0.9 % IR SOLN
Status: DC | PRN
  Administered 2011-10-31: 11:00:00

## 2011-10-31 MED ORDER — ALBUMIN HUMAN 5 % IV SOLN: 250.0000 mL | INTRAVENOUS | Status: AC | PRN

## 2011-10-31 MED ORDER — PHENYLEPHRINE HCL 10 MG/ML IJ SOLN
0.0000 ug/min | INTRAVENOUS | Status: DC
  Filled 2011-10-31: qty 2

## 2011-10-31 MED ORDER — INSULIN ASPART 100 UNIT/ML ~~LOC~~ SOLN
0.0000 [IU] | SUBCUTANEOUS | Status: AC
  Administered 2011-10-31 – 2011-11-01 (×2): 2 [IU] via SUBCUTANEOUS

## 2011-10-31 MED ORDER — MIDAZOLAM HCL 2 MG/2ML IJ SOLN
2.0000 mg | INTRAMUSCULAR | Status: DC | PRN
  Administered 2011-11-01: 2 mg via INTRAVENOUS
  Filled 2011-10-31: qty 2

## 2011-10-31 MED ORDER — ASPIRIN EC 325 MG PO TBEC
325.0000 mg | DELAYED_RELEASE_TABLET | Freq: Every day | ORAL | Status: DC
  Administered 2011-11-01 – 2011-11-07 (×2): 325 mg via ORAL
  Filled 2011-10-31 (×9): qty 1

## 2011-10-31 MED ORDER — MAGNESIUM SULFATE 40 MG/ML IJ SOLN
4.0000 g | Freq: Once | INTRAMUSCULAR | Status: AC
  Administered 2011-10-31: 4 g via INTRAVENOUS
  Filled 2011-10-31: qty 100

## 2011-10-31 MED ORDER — NITROGLYCERIN IN D5W 200-5 MCG/ML-% IV SOLN
0.0000 ug/min | INTRAVENOUS | Status: DC
  Filled 2011-10-31: qty 250

## 2011-10-31 MED ORDER — LACTATED RINGERS IV SOLN
INTRAVENOUS | Status: DC | PRN
  Administered 2011-10-31 (×3): via INTRAVENOUS

## 2011-10-31 MED ORDER — MORPHINE SULFATE 2 MG/ML IJ SOLN
2.0000 mg | INTRAMUSCULAR | Status: DC | PRN
  Administered 2011-10-31 – 2011-11-01 (×2): 2 mg via INTRAVENOUS
  Administered 2011-11-01 (×3): 4 mg via INTRAVENOUS
  Administered 2011-11-01: 2 mg via INTRAVENOUS
  Administered 2011-11-01: 4 mg via INTRAVENOUS
  Filled 2011-10-31: qty 2
  Filled 2011-10-31 (×3): qty 1
  Filled 2011-10-31 (×3): qty 2

## 2011-10-31 MED ORDER — BISACODYL 5 MG PO TBEC
10.0000 mg | DELAYED_RELEASE_TABLET | Freq: Every day | ORAL | Status: DC
  Administered 2011-11-01 – 2011-11-07 (×2): 10 mg via ORAL
  Filled 2011-10-31 (×2): qty 2

## 2011-10-31 SURGICAL SUPPLY — 134 items
ADH SKN CLS APL DERMABOND .7 (GAUZE/BANDAGES/DRESSINGS) ×2
ATTRACTOMAT 16X20 MAGNETIC DRP (DRAPES) ×3 IMPLANT
BAG DECANTER FOR FLEXI CONT (MISCELLANEOUS) ×4 IMPLANT
BANDAGE ELASTIC 4 VELCRO ST LF (GAUZE/BANDAGES/DRESSINGS) ×3 IMPLANT
BANDAGE ELASTIC 6 VELCRO ST LF (GAUZE/BANDAGES/DRESSINGS) ×3 IMPLANT
BANDAGE GAUZE ELAST BULKY 4 IN (GAUZE/BANDAGES/DRESSINGS) ×3 IMPLANT
BLADE STERNUM SYSTEM 6 (BLADE) ×3 IMPLANT
BLADE SURG ROTATE 9660 (MISCELLANEOUS) ×1 IMPLANT
CANISTER SUCTION 2500CC (MISCELLANEOUS) ×6 IMPLANT
CANN PRFSN .5XCNCT 15X34-48 (MISCELLANEOUS) ×2
CANNULA AORTIC HI-FLOW 6.5M20F (CANNULA) ×3 IMPLANT
CANNULA PRFSN .5XCNCT 15X34-48 (MISCELLANEOUS) ×2 IMPLANT
CANNULA VEN 2 STAGE (MISCELLANEOUS) ×3
CATH CPB KIT GERHARDT (MISCELLANEOUS) ×3 IMPLANT
CATH ROBINSON RED A/P 18FR (CATHETERS) ×3 IMPLANT
CATH SUCT 10FR WHISTLE TIP (CATHETERS) ×3 IMPLANT
CATH THORACIC 28FR (CATHETERS) ×3 IMPLANT
CATH THORACIC 36FR (CATHETERS) IMPLANT
CATH THORACIC 36FR RT ANG (CATHETERS) IMPLANT
CLIP TI MEDIUM 24 (CLIP) ×3 IMPLANT
CLIP TI WIDE RED SMALL 24 (CLIP) ×3 IMPLANT
CLOTH BEACON ORANGE TIMEOUT ST (SAFETY) ×6 IMPLANT
COVER SURGICAL LIGHT HANDLE (MISCELLANEOUS) ×10 IMPLANT
CRADLE DONUT ADULT HEAD (MISCELLANEOUS) ×5 IMPLANT
DERMABOND ADVANCED (GAUZE/BANDAGES/DRESSINGS) ×1
DERMABOND ADVANCED .7 DNX12 (GAUZE/BANDAGES/DRESSINGS) ×2 IMPLANT
DRAIN CHANNEL 15F RND FF W/TCR (WOUND CARE) IMPLANT
DRAIN CHANNEL 28F RND 3/8 FF (WOUND CARE) ×3 IMPLANT
DRAIN CHANNEL 32F RND 10.7 FF (WOUND CARE) IMPLANT
DRAPE CARDIOVASCULAR INCISE (DRAPES) ×3
DRAPE SLUSH MACHINE 52X66 (DRAPES) IMPLANT
DRAPE SLUSH/WARMER DISC (DRAPES) ×1 IMPLANT
DRAPE SRG 135X102X78XABS (DRAPES) ×2 IMPLANT
DRAPE WARM FLUID 44X44 (DRAPE) ×2 IMPLANT
DRSG COVADERM 4X10 (GAUZE/BANDAGES/DRESSINGS) ×1 IMPLANT
DRSG COVADERM 4X14 (GAUZE/BANDAGES/DRESSINGS) ×3 IMPLANT
ELECT BLADE 4.0 EZ CLEAN MEGAD (MISCELLANEOUS) ×3
ELECT CAUTERY BLADE 6.4 (BLADE) ×3 IMPLANT
ELECT REM PT RETURN 9FT ADLT (ELECTROSURGICAL) ×9
ELECTRODE BLDE 4.0 EZ CLN MEGD (MISCELLANEOUS) ×2 IMPLANT
ELECTRODE REM PT RTRN 9FT ADLT (ELECTROSURGICAL) ×6 IMPLANT
EVACUATOR SILICONE 100CC (DRAIN) IMPLANT
GLOVE BIO SURGEON STRL SZ 6 (GLOVE) ×5 IMPLANT
GLOVE BIO SURGEON STRL SZ 6.5 (GLOVE) ×10 IMPLANT
GLOVE BIO SURGEON STRL SZ7 (GLOVE) IMPLANT
GLOVE BIO SURGEON STRL SZ7.5 (GLOVE) ×2 IMPLANT
GLOVE BIOGEL PI IND STRL 6 (GLOVE) IMPLANT
GLOVE BIOGEL PI IND STRL 6.5 (GLOVE) IMPLANT
GLOVE BIOGEL PI IND STRL 7.0 (GLOVE) IMPLANT
GLOVE BIOGEL PI IND STRL 7.5 (GLOVE) ×2 IMPLANT
GLOVE BIOGEL PI INDICATOR 6 (GLOVE)
GLOVE BIOGEL PI INDICATOR 6.5 (GLOVE) ×4
GLOVE BIOGEL PI INDICATOR 7.0 (GLOVE) ×3
GLOVE BIOGEL PI INDICATOR 7.5 (GLOVE) ×1
GLOVE EUDERMIC 7 POWDERFREE (GLOVE) IMPLANT
GLOVE ORTHO TXT STRL SZ7.5 (GLOVE) IMPLANT
GLOVE SURG SS PI 7.5 STRL IVOR (GLOVE) ×3 IMPLANT
GOWN EXTRA PROTECTION XL (GOWNS) ×2 IMPLANT
GOWN PREVENTION PLUS XXLARGE (GOWN DISPOSABLE) ×3 IMPLANT
GOWN STRL NON-REIN LRG LVL3 (GOWN DISPOSABLE) ×21 IMPLANT
HEMOSTAT POWDER SURGIFOAM 1G (HEMOSTASIS) ×10 IMPLANT
HEMOSTAT SNOW SURGICEL 2X4 (HEMOSTASIS) ×2 IMPLANT
HEMOSTAT SURGICEL 2X14 (HEMOSTASIS) ×3 IMPLANT
INSERT FOGARTY 61MM (MISCELLANEOUS) IMPLANT
INSERT FOGARTY SM (MISCELLANEOUS) IMPLANT
INSERT FOGARTY XLG (MISCELLANEOUS) IMPLANT
KIT BASIN OR (CUSTOM PROCEDURE TRAY) ×6 IMPLANT
KIT ROOM TURNOVER OR (KITS) ×6 IMPLANT
KIT SUCTION CATH 14FR (SUCTIONS) ×6 IMPLANT
KIT VASOVIEW W/TROCAR VH 2000 (KITS) ×3 IMPLANT
LEAD PACING MYOCARDI (MISCELLANEOUS) ×3 IMPLANT
MARKER GRAFT CORONARY BYPASS (MISCELLANEOUS) ×9 IMPLANT
NS IRRIG 1000ML POUR BTL (IV SOLUTION) ×21 IMPLANT
PACK CAROTID (CUSTOM PROCEDURE TRAY) ×2 IMPLANT
PACK OPEN HEART (CUSTOM PROCEDURE TRAY) ×3 IMPLANT
PAD ARMBOARD 7.5X6 YLW CONV (MISCELLANEOUS) ×10 IMPLANT
PATCH VASCULAR VASCU GUARD 1X6 (Vascular Products) ×1 IMPLANT
PENCIL BUTTON HOLSTER BLD 10FT (ELECTRODE) ×3 IMPLANT
PUNCH AORTIC ROTATE 4.0MM (MISCELLANEOUS) IMPLANT
PUNCH AORTIC ROTATE 4.5MM 8IN (MISCELLANEOUS) ×1 IMPLANT
PUNCH AORTIC ROTATE 5MM 8IN (MISCELLANEOUS) IMPLANT
SHUNT CAROTID BYPASS 10 (VASCULAR PRODUCTS) IMPLANT
SHUNT CAROTID BYPASS 12FRX15.5 (VASCULAR PRODUCTS) IMPLANT
SOLUTION ANTI FOG 6CC (MISCELLANEOUS) IMPLANT
SPECIMEN JAR SMALL (MISCELLANEOUS) ×3 IMPLANT
SPONGE GAUZE 4X4 12PLY (GAUZE/BANDAGES/DRESSINGS) ×9 IMPLANT
SPONGE INTESTINAL PEANUT (DISPOSABLE) ×2 IMPLANT
SPONGE LAP 18X18 X RAY DECT (DISPOSABLE) ×5 IMPLANT
SPONGE LAP 4X18 X RAY DECT (DISPOSABLE) IMPLANT
SUT BONE WAX W31G (SUTURE) ×3 IMPLANT
SUT ETHILON 3 0 PS 1 (SUTURE) ×1 IMPLANT
SUT MNCRL AB 4-0 PS2 18 (SUTURE) IMPLANT
SUT PROLENE 3 0 SH DA (SUTURE) IMPLANT
SUT PROLENE 3 0 SH1 36 (SUTURE) ×3 IMPLANT
SUT PROLENE 4 0 RB 1 (SUTURE)
SUT PROLENE 4 0 SH DA (SUTURE) IMPLANT
SUT PROLENE 4 0 TF (SUTURE) ×6 IMPLANT
SUT PROLENE 4-0 RB1 .5 CRCL 36 (SUTURE) IMPLANT
SUT PROLENE 5 0 C 1 36 (SUTURE) IMPLANT
SUT PROLENE 6 0 BV (SUTURE) ×4 IMPLANT
SUT PROLENE 6 0 C 1 30 (SUTURE) ×3 IMPLANT
SUT PROLENE 6 0 CC (SUTURE) ×9 IMPLANT
SUT PROLENE 7 0 BV 1 (SUTURE) ×1 IMPLANT
SUT PROLENE 7 0 BV1 MDA (SUTURE) ×5 IMPLANT
SUT PROLENE 7.0 RB 3 (SUTURE) IMPLANT
SUT PROLENE 8 0 BV175 6 (SUTURE) ×3 IMPLANT
SUT SILK  1 MH (SUTURE)
SUT SILK 1 MH (SUTURE) IMPLANT
SUT SILK 2 0 SH CR/8 (SUTURE) IMPLANT
SUT SILK 3 0 SH CR/8 (SUTURE) IMPLANT
SUT STEEL 6MS V (SUTURE) ×3 IMPLANT
SUT STEEL STERNAL CCS#1 18IN (SUTURE) IMPLANT
SUT STEEL SZ 6 DBL 3X14 BALL (SUTURE) ×3 IMPLANT
SUT VIC AB 1 CTX 18 (SUTURE) ×6 IMPLANT
SUT VIC AB 1 CTX 36 (SUTURE)
SUT VIC AB 1 CTX36XBRD ANBCTR (SUTURE) IMPLANT
SUT VIC AB 2-0 CT1 27 (SUTURE) ×3
SUT VIC AB 2-0 CT1 TAPERPNT 27 (SUTURE) IMPLANT
SUT VIC AB 2-0 CTX 27 (SUTURE) ×1 IMPLANT
SUT VIC AB 3-0 SH 27 (SUTURE) ×6
SUT VIC AB 3-0 SH 27X BRD (SUTURE) ×4 IMPLANT
SUT VIC AB 3-0 X1 27 (SUTURE) IMPLANT
SUT VICRYL 4-0 PS2 18IN ABS (SUTURE) ×4 IMPLANT
SUTURE E-PAK OPEN HEART (SUTURE) ×3 IMPLANT
SYSTEM SAHARA CHEST DRAIN ATS (WOUND CARE) ×3 IMPLANT
TAPE CLOTH SURG 4X10 WHT LF (GAUZE/BANDAGES/DRESSINGS) ×3 IMPLANT
TOWEL OR 17X24 6PK STRL BLUE (TOWEL DISPOSABLE) ×9 IMPLANT
TOWEL OR 17X26 10 PK STRL BLUE (TOWEL DISPOSABLE) ×9 IMPLANT
TRAY FOLEY IC TEMP SENS 14FR (CATHETERS) ×3 IMPLANT
TUBE FEEDING 8FR 16IN STR KANG (MISCELLANEOUS) ×3 IMPLANT
TUBE SUCT INTRACARD DLP 20F (MISCELLANEOUS) ×3 IMPLANT
TUBING INSUFFLATION 10FT LAP (TUBING) ×3 IMPLANT
UNDERPAD 30X30 INCONTINENT (UNDERPADS AND DIAPERS) ×3 IMPLANT
WATER STERILE IRR 1000ML POUR (IV SOLUTION) ×9 IMPLANT

## 2011-10-31 NOTE — H&P (Signed)
Jorge Ny, MD 09/29/2011 4:01 PM Signed  [facility] [place]  Patient name: Nathan [name] MRN: [id number] DOB: 1941/05/04 Sex: male  Referred by: Dr. Tyrone Sage  Reason for referral:  Chief Complaint   Patient presents with   .  Carotid     New pt/Dr. Tyrone Sage    HISTORY OF PRESENT ILLNESS:  The patient is referred today for evaluation of right carotid stenosis. He has been followed by Dr. Katrinka Blazing for peripheral vascular disease as well as coronary artery disease. He is previously undergone lower extremity intervention by Dr. Forest Gleason. He recently underwent cardiac catheterization which showed significant disease and was referred for possible coronary artery bypass grafting. During his workup he was found to have a high-grade carotid stenosis on the right by duplex. This was further evaluated with CT imaging. He is here today to discuss his options. The patient denies symptoms. Specifically he denies numbness or weakness in either extremity. He denies slurred speech. He denies amaurosis fugax. The patient is a current smoker. He is trying to stop. Smoking for 63 years. He is medically managed for his hypertension and hypercholesterolemia which had been controlled  Past Medical History   Diagnosis  Date   .  Aorto-iliac disease      bilat with stent 2007   .  CAD (coronary artery disease)      with loop to the circumflex 2000, inferolateral infarction   .  Hypertension    .  Paroxysmal atrial flutter      ablation   .  ED (erectile dysfunction)    .  COPD (chronic obstructive pulmonary disease)    .  Hyperlipidemia    .  Stroke     Past Surgical History   Procedure  Date   .  Spinal fusion    .  Hand surgery    .  Neck fusion  1975    History    Social History   .  Marital Status:  Married     Spouse Name:  N/A     Number of Children:  N/A   .  Years of Education:  N/A    Occupational History   .  retired     Social History Main Topics   .   Smoking status:  Current Everyday Smoker -- 1.0 packs/day for 60 years     Types:  Cigarettes   .  Smokeless tobacco:  Never Used     Comment: smoking cessation info given and reviewed    .  Alcohol Use:  3.6 oz/week     6 Shots of liquor per week      previous history of heavy alcohol use 12 pack of beer /day now 4-5 drinks per week   .  Drug Use:  No   .  Sexually Active:  Not on file    Other Topics  Concern   .  Not on file    Social History Narrative   .  No narrative on file    Family History   Problem  Relation  Age of Onset   .  Cancer  Mother     Allergies as of 09/29/2011   .  (No Known Allergies)    Current Outpatient Prescriptions on File Prior to Visit   Medication  Sig  Dispense  Refill   .  allopurinol (ZYLOPRIM) 300 MG tablet  Take 300 mg by mouth daily.     Marland Kitchen  amLODipine (  NORVASC) 10 MG tablet  Take 5 mg by mouth daily.     Marland Kitchen  aspirin EC 81 MG EC tablet  Take 1 tablet (81 mg total) by mouth daily.  30 tablet  0   .  buPROPion (WELLBUTRIN SR) 150 MG 12 hr tablet  Take 150 mg by mouth 2 (two) times daily.     .  colchicine 0.6 MG tablet  Take 1 tablet (0.6 mg total) by mouth daily.  30 tablet  2   .  isosorbide mononitrate (IMDUR) 30 MG 24 hr tablet  Take 30 mg by mouth daily.     Marland Kitchen  lisinopril (PRINIVIL,ZESTRIL) 20 MG tablet  Take 10 mg by mouth daily.     .  metoprolol (LOPRESSOR) 50 MG tablet  Take 25 mg by mouth 2 (two) times daily.     .  nitroGLYCERIN (NITROSTAT) 0.4 MG SL tablet  Place 0.4 mg under the tongue every 5 (five) minutes as needed.     Marland Kitchen  omeprazole (PRILOSEC) 20 MG capsule  Take 20 mg by mouth daily.     .  simvastatin (ZOCOR) 10 MG tablet  Take 10 mg by mouth at bedtime.      REVIEW OF SYSTEMS:  Cardiovascular: Positive for shortness of breath when lying flat, shortness of breath with exertion, pain in legs with walking.  Pulmonary: No productive cough, asthma. Positive for wheezing.  Neurologic: No paresthesias, aphasia, or amaurosis. No  dizziness.  Hematologic: No bleeding problems or clotting disorders.  Musculoskeletal: No joint pain or joint swelling.  Gastrointestinal: No blood in stool or hematemesis  Genitourinary: No dysuria or hematuria.  Psychiatric:: No history of major depression.  Integumentary: No rashes or ulcers.  Constitutional: No fever or chills.  PHYSICAL EXAMINATION:  General: The patient appears their stated age. Vital signs are BP 141/63  Pulse 62  Temp(Src) 98.2 F (36.8 C) (Oral)  Ht 6\' 1"  (1.854 m)  Wt 207 lb (93.895 kg)  BMI 27.31 kg/m2  SpO2 99%  HEENT: No gross abnormalities  Pulmonary: Respirations are non-labored  Abdomen: Soft and non-tender  Musculoskeletal: There are no major deformities.  Neurologic: No focal weakness or paresthesias are detected,  Skin: There are no ulcer or rashes noted.  Psychiatric: The patient has normal affect.  Cardiovascular: There is a regular rate and rhythm without significant murmur appreciated. Bilateral carotid bruits. Palpable femoral pulses bilaterally  Diagnostic Studies:  I have reviewed his CT angiogram of the neck which shows high-grade right carotid stenosis.  Assessment:  Right carotid stenosis, greater than 80% in the setting of coronary artery disease  Plan:  I discussed several options for management of this problem. The first would be to proceed with combined carotid CABG. The second would be carotid stenting followed by coronary intervention. The third would be carotid endarterectomy followed by coronary intervention. Fourth would be to proceed with coronary intervention followed by carotid endarterectomy. After reviewing the procedures as well as the pros and cons the patient wishes to proceed with a combined right carotid endarterectomy and coronary artery bypass grafting. I like to discuss this further with both Dr. Katrinka Blazing and Dr. Tyrone Sage. I discussed the risks and benefits of surgery which include the risk of nerve injury, the risk of  stroke, risk of bleeding. All his questions were answered today. Contact him on a final decision has been made.   No neurologic events since last visit.  Patient re-examined and re-evaluated.  No changes.    V.  Charlena Cross, M.D.  [facility]  Office: [phone]  Pager: [phone]

## 2011-10-31 NOTE — Addendum Note (Signed)
Addendum  created 10/31/11 1641 by Constantino Starace, CRNA   Modules edited:Anesthesia Events, Charges VN, Notes Section    

## 2011-10-31 NOTE — Transfer of Care (Signed)
Immediate Anesthesia Transfer of Care Note  Patient: Nathan [name]  Procedure(s) Performed: Procedure(s) (LRB): CORONARY ARTERY BYPASS GRAFTING (CABG) (N/A) ENDARTERECTOMY CAROTID (Right)  Patient Location: SICU  Anesthesia Type: General  Level of Consciousness: sedated and Patient remains intubated per anesthesia plan  Airway & Oxygen Therapy: Patient remains intubated per anesthesia plan and Patient placed on Ventilator (see vital sign flow sheet for setting)  Report given to SICU nurse  Post vital signs: Reviewed  Complications: No apparent anesthesia complications

## 2011-10-31 NOTE — Anesthesia Postprocedure Evaluation (Signed)
  Anesthesia Post-op Note  Patient: Nathan [name]  Procedure(s) Performed: Procedure(s) (LRB): CORONARY ARTERY BYPASS GRAFTING (CABG) (N/A) ENDARTERECTOMY CAROTID (Right)  Patient Location: SICU  Anesthesia Type: General  Level of Consciousness: sedated and Patient remains intubated per anesthesia plan  Airway and Oxygen Therapy: Patient remains intubated per anesthesia plan and Patient placed on Ventilator (see vital sign flow sheet for setting)  Post-op Pain: none  Post-op Assessment: Post-op Vital signs reviewed  Post-op Vital Signs: Reviewed  Complications: No apparent anesthesia complications

## 2011-10-31 NOTE — Brief Op Note (Addendum)
[address]                  [address]            [place] [address]          [phone]    10/31/2011  2:01 PM  PATIENT:  Nathan [name]  71 y.o. male  PRE-OPERATIVE DIAGNOSIS:  CAD, ECCVOD  POST-OPERATIVE DIAGNOSIS:  SAME PROCEDURE:  Procedure(s): CORONARY ARTERY BYPASS GRAFTING (CABG)X 4 LIMA;LAD;SVG-OM; SVG-DIST RCA; SVG-DIAG ENDARTERECTOMY CAROTID(RIGHT) EVH-RIGHT LEG  SURGEON:  Surgeon(s): Nathan Libman, MD Delight Ovens, MD  PHYSICIAN ASSISTANT: WAYNE GOLD PA-C  ANESTHESIA:   general  PATIENT CONDITION:  ICU - intubated and hemodynamically stable.  PRE-OPERATIVE WEIGHT: 91kg  COMPLICATIONS: NO COMPLICATIONS

## 2011-10-31 NOTE — Op Note (Signed)
[facility]  Patient name: Nathan [name] MRN: [id number] DOB: Aug 23, 1940 Sex: male  10/31/2011 Pre-operative Diagnosis: Asymptomatic  right carotid stenosis Post-operative diagnosis:  Same Surgeon:  Jorge Ny Assistants:  Narda Amber, P.A. Procedure:    right carotid Endarterectomy with bovine pericardial  patch angioplasty Anesthesia:  General Blood Loss:  See anesthesia record Specimens:  Carotid Plaque to pathology  Findings:  90% %stenosis; Thrombus:  none  Indications:  The patient has been followed by Dr. Katrinka Blazing for peripheral vascular disease as well as coronary artery disease. He has previously undergone lower extremity intervention by Dr. Forest Gleason. He recently underwent cardiac catheterization for an abnormal stress test which showed significant disease and was referred for possible coronary artery bypass grafting. During his workup he was found to have a high-grade carotid stenosis on the right by duplex. This was further evaluated with CT imaging.  He is asymptomatic.  We discussed stenting vs combined CEA/ CABG.  He wished to proceed with surgery.  He has remained symptom free since our clinic visit.  Procedure:  The patient was identified in the holding area and taken to [facility] OR ROOM 14  The patient was then placed supine on the table.   General endotrachial anesthesia was administered.  The patient was prepped and draped in the usual sterile fashion.  A time out was called and antibiotics were administered.  The incision was made along the anterior border of the right sternocleidomastoid muscle.  Cautery was used to dissect through the subcutaneous tissue.  The platysma muscle was divided with cautery.  The internal jugular vein was exposed along its anterior medial border.  The common facial vein was exposed and then divided between 2-0 silk ties and metal clips.  The common carotid artery was then circumferentially exposed and encircled with an  umbilical tape.  The vagus nerve was identified and protected.  Next sharp dissection was used to expose the external carotid artery and the superior thyroid artery.  The were encircled with a blue vessel loop and a 2-0 silk tie respectively.  Finally, the internal carotid was carefully dissected free.  An umbilical tape was placed around the internal carotid artery distal to the diseased segment.  The hypoglossal nerve was visualized throughout and protected.  The lesion was very high and required mobilization of the internal carotid artery under the nerve.  The artery was dissected  Out as high as I could mobilize it.  The patient was given systemic heparinization.  A bovine carotid patch was selected and prepared on the back table.  A 10 french shunt was also prepared.  After blood pressure readings were appropriate and the heparin had been given time to circulate, the internal carotid artery was occluded with a baby Gregory clamp.  The external and common carotid arteries were then occluded with vascular clamps and the 2-0 tie tightened on the superior thyroid artery.  A #11 blade was used to make an arteriotomy in the common carotid artery.  This was extended with Potts scissors along the anterior and lateral border of the common and internal carotid artery.  Approximately 90% stenosis was identified.  There was no thrombus identified. I felt the lesion was too high to place a shunt.  The patient had good back-bleeding from the internal carotid artery and I felt it was safe to proceed without a shunt.  A kleiner kuntz elevator was used to perform endarterectomy.  An eversion endarterectomy was performed in the  external carotid artery.  A good distal endpoint was obtained in the internal carotid artery.  I placed several 7-0 tacking sutures to make sure there was no flap that could raise.  The specimen was removed and sent to pathology.  Heparinized saline was used to irrigate the endarterectomized field.  All  potential embolic debris was removed.  Bovine pericardial patch angioplasty was then performed using a running 6-0 Prolene.  The common internal and external carotid arteries were all appropriately flushed. The artery was again irrigated with heparin saline.  The anastomosis was then secured. The clamp was first released on the external carotid artery followed by the common carotid artery approximately 30 seconds later, bloodflow was reestablish through the internal carotid artery.  Next, a hand-held  Doppler was used to evaluate the signals in the common, external, and internal  carotid arteries, all of which had appropriate signals. I then administered  50 mg protamine. The wound was then irrigated.  After hemostasis was achieved, I packed the wound with gauze and closed the skin with 3-0 nylon.  Dr. Tyrone Sage came in at this point to perform his portion of the proceedure     Disposition:  To PACU in stable condition.  Relevant Operative Details:  Extremely high lesion which prevented the use of a shunt.  Juleen China, M.D. [facility] Office: [phone] Pager:  [phone]

## 2011-10-31 NOTE — Progress Notes (Signed)
  Echocardiogram Echocardiogram Transesophageal has been performed.  Nathan [name] 10/31/2011, 9:02 AM

## 2011-10-31 NOTE — Anesthesia Preprocedure Evaluation (Addendum)
Anesthesia Evaluation  Patient identified by MRN, date of birth, ID band Patient awake    Reviewed: Allergy & Precautions, H&P , NPO status , Patient's Chart, lab work & pertinent test results  Airway Mallampati: I TM Distance: <3 FB Neck ROM: Full    Dental  (+) Teeth Intact and Dental Advisory Given   Pulmonary  breath sounds clear to auscultation        Cardiovascular hypertension, Pt. on medications + CAD Rhythm:Regular Rate:Normal     Neuro/Psych    GI/Hepatic   Endo/Other    Renal/GU      Musculoskeletal   Abdominal   Peds  Hematology   Anesthesia Other Findings Permanent fixed posts with teeth attached  Reproductive/Obstetrics                          Anesthesia Physical Anesthesia Plan  ASA: IV  Anesthesia Plan: General   Post-op Pain Management:    Induction: Intravenous  Airway Management Planned: Oral ETT and Video Laryngoscope Planned  Additional Equipment: Arterial line and PA Cath  Intra-op Plan:   Post-operative Plan: Extubation in OR  Informed Consent: I have reviewed the patients History and Physical, chart, labs and discussed the procedure including the risks, benefits and alternatives for the proposed anesthesia with the patient or authorized representative who has indicated his/her understanding and acceptance.   Dental advisory given  Plan Discussed with: CRNA, Anesthesiologist and Surgeon  Anesthesia Plan Comments:        Anesthesia Quick Evaluation

## 2011-10-31 NOTE — Addendum Note (Signed)
Addendum  created 10/31/11 1641 by Coralee Rud, CRNA   Modules edited:Anesthesia Events, Charges VN, Notes Section

## 2011-10-31 NOTE — H&P (Signed)
[address]            [place] [address]          [phone]     Nathan [name] [facility] Medical Record #[id number] Date of Birth: 1941-01-31  Referring: No ref. provider found Primary Care: Lesleigh Noe, MD  Chief Complaint:   Coronary artery disease   History of Present Illness:   The patient returns today after obtaining a CTA of the neck and head to evaluate his carotid disease. And evaluated by vascular surgery Patient with increasing left leg claudication. He has known history of PVD with stents to the iliac in 2007. He has a history of inferior mi in the past 12 years ago. Recent  Stress at the [facility] 07/2011 report ly showed reversible defect at the apex and anterior wall, fixed defect of the RCA and lateral wall.Patient does not complain of angina, does have exertional SOB and left leg claudication that is limiting. Cardiac cath done by Dr Katrinka Blazing in preparation for possible vascular surgery.Patient is long term smoker and says today he is not interested in stopping, 10 min of the visit was spent in discussing stopping.   Since seeing initially the patient has had no chest pain, he continues to smoke. He notes that he has no intention of stopping.   Current Activity/ Functional Status: Patient is independent with mobility/ambulation, transfers, ADL's, IADL's.   Past Medical History  Diagnosis Date  . Aorto-iliac disease     bilat with stent 2007  . CAD (coronary artery disease)     with loop to the circumflex 2000, inferolateral infarction  . Hypertension   . Paroxysmal atrial flutter     ablation  . ED (erectile dysfunction)   . COPD (chronic obstructive pulmonary disease)   . Hyperlipidemia   . Stroke   . Shortness of breath   . GERD (gastroesophageal reflux disease)   . Gout     Past Surgical History  Procedure Date  . Spinal fusion   . Hand surgery   . Neck fusion 1975  . Eye surgery      Family History: father died of "old" age at 95, Mother died of unknown cause but had history of TB, 3 sisters and one brother, brother had MI at "young" age  History   Social History  . Marital Status: Married    Spouse Name: N/A    Number of Children: N/A  . Years of Education: N/A   Occupational History  . retired    Social History Main Topics  . Smoking status: Current Everyday Smoker -- 1.0 packs/day for 60 years    Types: Cigarettes  . Smokeless tobacco: Never Used  . 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 Drug Use: No            History  Smoking status  . Current Everyday Smoker -- 1.0 packs/day for 60 years  . Types: Cigarettes  Smokeless tobacco  . Never Used  Comment: smoking cessation info given and reviewed    History  Alcohol Use  . 3.6 oz/week  . 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 cefUROXime (ZINACEF) 1.5 g in dextrose 5 % 50 mL IVPB  1.5 g Intravenous To OR Delight Ovens, MD      . cefUROXime (ZINACEF) 750 mg in dextrose 5 % 50 mL IVPB  750 mg Intravenous To OR Delight Ovens, MD      . chlorhexidine (HIBICLENS) 4 % liquid 2 application  30 mL Topical UD Delight Ovens, MD      . dexmedetomidine (PRECEDEX) 400 mcg in sodium chloride 0.9 % 100 mL infusion  0.1-0.7 mcg/kg/hr (Order-Specific) Intravenous To OR Delight Ovens, MD      . DOPamine (INTROPIN) 800 mg in dextrose 5 % 250 mL infusion  2-20 mcg/kg/min (Order-Specific) Intravenous To OR Delight Ovens, MD      . EPINEPHrine (ADRENALIN) 4,000 mcg in dextrose 5 % 250 mL infusion  0.5-20 mcg/min Intravenous To OR Delight Ovens, MD      . insulin regular  (NOVOLIN R,HUMULIN R) 1 Units/mL in sodium chloride 0.9 % 100 mL infusion   Intravenous To OR Delight Ovens, MD      . magnesium sulfate (IV Push/IM) injection 40 mEq  40 mEq Other To OR Delight Ovens, MD      . metoprolol tartrate (LOPRESSOR) tablet 12.5 mg  12.5 mg Oral Once Delight Ovens, MD      . nitroGLYCERIN 0.2 mg/mL in dextrose 5 % infusion  2-200 mcg/min Intravenous To OR Delight Ovens, MD      . nitroglycerin-nicardipine-HEPARIN-sodium bicarbonate irrigation for artery spasm   Irrigation To OR Delight Ovens, MD      . phenylephrine (NEO-SYNEPHRINE) 20,000 mcg in dextrose 5 % 250 mL infusion  30-200 mcg/min Intravenous To OR Delight Ovens, MD      . potassium chloride injection 80 mEq  80 mEq Other To OR Delight Ovens, MD      . tranexamic acid (CYKLOKAPRON) bolus via infusion - over 30 minutes 1,369.5 mg  15 mg/kg (Order-Specific) Intravenous To OR Delight Ovens, MD      . tranexamic acid (CYKLOKAPRON) pump prime solution 183 mg  2 mg/kg (Order-Specific) Intracatheter To OR Delight Ovens, MD      . tranexamic acid (CYKLOLAPRON) 2,500 mg in sodium chloride 0.9 % 250 mL infusion  1.5 mg/kg/hr (Order-Specific) Intravenous To OR Delight Ovens, MD      . vancomycin (VANCOCIN) 1,500 mg in sodium chloride 0.9 % 250 mL IVPB  1,500 mg Intravenous To OR Delight Ovens, MD           Review of Systems:     Cardiac Review of Systems: Y or N  Chest Pain Katrina.Aver    ]  Resting SOB [ n  ] Exertional SOB  [ y ]  Orthopnea [ n ]   Pedal Edema [ y  ]    Palpitations [ n ] Syncope  [ n ]   Presyncope [  n ]  General Review of Systems: [Y] = yes [  ]=no Constitional: recent weight change [ n ]; anorexia [n  ]; fatigue [ y ]; nausea [  ]; night sweats [  n  ]; fever [n  ]; or chills [n  ];                                                                                                                                          Dental: poor dentition[y  ];   Eye :  blurred vision [ n ]; diplopia [ n  ]; vision changes [ n ];  Amaurosis fugax[  ]; Resp: cough [  ];  wheezing[y  ];  hemoptysis[n  ]; shortness of breath[ y ]; paroxysmal nocturnal dyspnea[  ]; dyspnea on exertion[y  ]; or orthopnea[  ];  GI:  gallstones[  ], vomiting[  ];  dysphagia[  ]; melena[  ];  hematochezia [  ]; heartburn[  ];   Hx of  Colonoscopy[  ]; GU: kidney stones [  ]; hematuria[  ];   dysuria [  ];  nocturia[  ];  history of     obstruction [  ];             Skin: rash, swelling[  ];, hair loss[  ];  peripheral edema[  ];  or itching[  ]; Musculosketetal: myalgias[  ];  joint swelling[  ];  joint erythema[  ];  joint pain[ y ];  back pain[  ];  Heme/Lymph: bruising[  ];  bleeding[  ];  anemia[  ];  Neuro: TIA[ n ];  headaches[  ];  stroke[  ];  vertigo[  ];  seizures[  ];   paresthesias[ n ];  difficulty walking[y  ];  Psych:depression[y  ]; anxiety[  ]; patient wife he has had change in mental status since OCT 2012 "in a fog"  Endocrine: diabetes[ n ];  thyroid dysfunction[  ];  Immunizations: Flu [ n ]; Pneumococcal[n  ];  Other:  Physical Exam: BP 169/78  Pulse 58  Temp 97.5 F (36.4 C) (Oral)  Resp 18  SpO2 100%  General appearance: alert, distracted, no distress and slowed mentation Neurologic: intact Heart: regular rate and rhythm, S1, S2 normal, no murmur, click, rub or gallop and normal apical impulse Lungs: clear to auscultation bilaterally and normal percussion bilaterally Abdomen: soft, non-tender; bowel sounds normal; no masses,  no organomegaly Extremities: venous stasis dermatitis noted and patient has depent rubor left greater then right withou palpable pedal pulses, 2+ pedal edema bilatrerial Bilaterial carotid bruits No cervical or axillary adenopathy   Diagnostic Studies & Laboratory data:     Recent Radiology Findings:  Ct Angio Head W/cm &/or Wo Cm  09/19/2011  *RADIOLOGY REPORT*  Clinical Data:  Right carotid stenosis.  Bilateral lacunar  infarcts.  CT ANGIOGRAPHY HEAD AND NECK  Technique:  Multidetector CT imaging of the head and neck was performed using the standard protocol during bolus administration of intravenous contrast.  Multiplanar CT image reconstructions including MIPs were obtained to evaluate the vascular anatomy. Carotid stenosis  measurements (when applicable) are obtained utilizing NASCET criteria, using the distal internal carotid diameter as the denominator.  Contrast: 80mL OMNIPAQUE IOHEXOL 350 MG/ML SOLN  Comparison:  CT head without contrast 09/11/2011.  CTA NECK  Findings:  Atherosclerotic calcifications and circumferential wall thickening are present at the aortic arch without focal stenosis. The right vertebral artery is the dominant vessel.  Focal calcification is associated with a high-grade stenosis at the origin of the right vertebral artery.  There is a high-grade stenosis at the origin of the nondominant left vertebral artery. There is very poor opacification of the left vertebral artery in the neck with a more normal caliber evident at the C2-3 level, suggesting that there is indeed a significant proximal stenosis. The left vertebral artery essentially terminates at the left PICA, suggesting either high-grade stenosis or occlusion.  Mild circumferential wall thickening and irregularity is noted in the distal common carotid artery.  There are calcifications at the bifurcation.  High-grade right internal carotid artery stenosis is present 2 cm from the carina with the equivalent of a string sign. The artery does assuming more normal caliber distal to this area.  An eccentric anterior soft tissue plaque in the distal left common carotid artery narrows the vessel to less than 50%.  There are some calcifications at the bifurcation without significant stenosis relative to the distal vessel.  Normal ICA caliber is maintained.  The soft tissues of the neck demonstrate no significant adenopathy. No focal mucosal lesions are  evident.  The salivary glands are symmetric.  Advance spondylosis is present in the cervical spine with anterior and posterior fusion.   Review of the MIP images confirms the above findings.  IMPRESSION:  1.  High-grade stenosis of the right internal carotid artery with a focal string sign approximately 2 cm from the bifurcation.  2.  Eccentric soft tissue plaque in the distal left common carotid artery with narrowing of less than 50%. 3.  High-grade calcific stenosis at the origin of the right vertebral artery, the dominant vessel. 4.  High-grade stenoses of the left vertebral artery air near occlusive with no contrast is evident beyond the left PICA.  A stone at the vertebral basilar junction suggests that this represents an occluded vessel. 5.  Advanced spondylosis of the cervical spine with extensive fusion.  CTA HEAD  Findings:  Noncontrast images of the head demonstrate multiple lacunar infarcts, as before.  There is no significant interval change.  No acute cortical infarct, hemorrhage, mass lesion is present.  A focal lacunar infarct is suggested in the left upper pons.  The ventricles are of normal size.  No significant extra- axial fluid collection is present.  The postcontrast images demonstrate no pathologic enhancement.  The paranasal sinuses and mastoid air cells are clear.  Atherosclerotic calcifications are present within the cavernous carotid arteries bilaterally with irregularity, but no significant stenoses of greater than 50%.  The left A1 segment is hypoplastic. The A2 segments are triplicated, a normal variant.  The ACA bifurcations are within normal limits.  There is significant segmental attenuation of anterior MCA branch vessels bilaterally, worse on the right.  Distal segmental ACA stenoses are present as well.  The right vertebral artery feeds the basilar.  There is a stump of the distal left vertebral artery, compatible with occlusion.  The right PICA origin is visualized and within  normal limits.  The posterior cerebral arteries both originate from the basilar tip.  A left posterior communicating artery is patent.  There is  segmental irregularity of distal branch vessels.  The dural sinuses are patent.   Review of the MIP images confirms the above findings.  IMPRESSION:  1.  Moderate small vessel disease. 2.  Atherosclerotic calcifications within the cavernous carotid arteries bilaterally without significant stenosis. 3.  Hypoplastic right A1 segment. 4.  Occluded distal left vertebral artery.  The basilar artery is fed from the dominant right vertebral artery. 5.  Small left posterior communicating artery. 6.  Stable appearance of bilateral lacunar infarcts of the basal ganglia and left upper brain stem.  Original Report Authenticated By: Jamesetta Orleans. MATTERN, M.D.   Ct Head W Wo Contrast  09/11/2011  *RADIOLOGY REPORT*  Clinical Data: Memory loss.  Confusion.  Lethargy.  Unsteady gait. Coronary artery disease.  CT HEAD WITHOUT AND WITH CONTRAST  Technique:  Contiguous axial images were obtained from the base of the skull through the vertex without and with intravenous contrast.  Contrast: 80mL OMNIPAQUE IOHEXOL 300 MG/ML  SOLN  Comparison: None.  Findings: Multiple lacunar infarcts are present within the basal ganglia.  Some of the views are clearly chronic.  There is a lesion in the left caudate head and another in the right thalamus which are age indeterminate.  Mild periventricular subcortical white matter hypoattenuation is present otherwise.  The postcontrast images demonstrate to a no pathologic enhancement. The ventricles are of normal size.  No significant extra-axial fluid collection is present.  The paranasal sinuses and mastoid air cells are clear.  IMPRESSION:  1.  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  Although several are clearly remote, others are age indeterminate. MRI is more sensitive and specific for the evaluation of acute ischemia if the patient can  tolerate an MRI. 2.  Mild generalized white matter disease likely reflects the sequelae of chronic microvascular ischemia.  Original Report Authenticated By: Jamesetta Orleans. MATTERN, M.D.   Ct Angio Neck W/cm &/or Wo/cm  09/19/2011  *RADIOLOGY REPORT*  Clinical Data:  Right carotid stenosis.  Bilateral lacunar infarcts.  CT ANGIOGRAPHY HEAD AND NECK  Technique:  Multidetector CT imaging of the head and neck was performed using the standard protocol during bolus administration of intravenous contrast.  Multiplanar CT image reconstructions including MIPs were obtained to evaluate the vascular anatomy. Carotid stenosis measurements (when applicable) are obtained utilizing NASCET criteria, using the distal internal carotid diameter as the denominator.  Contrast: 80mL OMNIPAQUE IOHEXOL 350 MG/ML SOLN  Comparison:  CT head without contrast 09/11/2011.  CTA NECK  Findings:  Atherosclerotic calcifications and circumferential wall thickening are present at the aortic arch without focal stenosis. The right vertebral artery is the dominant vessel.  Focal calcification is associated with a high-grade stenosis at the origin of the right vertebral artery.  There is a high-grade stenosis at the origin of the nondominant left vertebral artery. There is very poor opacification of the left vertebral artery in the neck with a more normal caliber evident at the C2-3 level, suggesting that there is indeed a significant proximal stenosis. The left vertebral artery essentially terminates at the left PICA, suggesting either high-grade stenosis or occlusion.  Mild circumferential wall thickening and irregularity is noted in the distal common carotid artery.  There are calcifications at the bifurcation.  High-grade right internal carotid artery stenosis is present 2 cm from the carina with the equivalent of a string sign. The artery does assuming more normal caliber distal to this area.  An eccentric anterior soft tissue plaque in the  distal left common carotid artery  narrows the vessel to less than 50%.  There are some calcifications at the bifurcation without significant stenosis relative to the distal vessel.  Normal ICA caliber is maintained.  The soft tissues of the neck demonstrate no significant adenopathy. No focal mucosal lesions are evident.  The salivary glands are symmetric.  Advance spondylosis is present in the cervical spine with anterior and posterior fusion.   Review of the MIP images confirms the above findings.  IMPRESSION:  1.  High-grade stenosis of the right internal carotid artery with a focal string sign approximately 2 cm from the bifurcation.  2.  Eccentric soft tissue plaque in the distal left common carotid artery with narrowing of less than 50%. 3.  High-grade calcific stenosis at the origin of the right vertebral artery, the dominant vessel. 4.  High-grade stenoses of the left vertebral artery air near occlusive with no contrast is evident beyond the left PICA.  A stone at the vertebral basilar junction suggests that this represents an occluded vessel. 5.  Advanced spondylosis of the cervical spine with extensive fusion.  CTA HEAD  Findings:  Noncontrast images of the head demonstrate multiple lacunar infarcts, as before.  There is no significant interval change.  No acute cortical infarct, hemorrhage, mass lesion is present.  A focal lacunar infarct is suggested in the left upper pons.  The ventricles are of normal size.  No significant extra- axial fluid collection is present.  The postcontrast images demonstrate no pathologic enhancement.  The paranasal sinuses and mastoid air cells are clear.  Atherosclerotic calcifications are present within the cavernous carotid arteries bilaterally with irregularity, but no significant stenoses of greater than 50%.  The left A1 segment is hypoplastic. The A2 segments are triplicated, a normal variant.  The ACA bifurcations are within normal limits.  There is significant  segmental attenuation of anterior MCA branch vessels bilaterally, worse on the right.  Distal segmental ACA stenoses are present as well.  The right vertebral artery feeds the basilar.  There is a stump of the distal left vertebral artery, compatible with occlusion.  The right PICA origin is visualized and within normal limits.  The posterior cerebral arteries both originate from the basilar tip.  A left posterior communicating artery is patent.  There is segmental irregularity of distal branch vessels.  The dural sinuses are patent.   Review of the MIP images confirms the above findings.  IMPRESSION:  1.  Moderate small vessel disease. 2.  Atherosclerotic calcifications within the cavernous carotid arteries bilaterally without significant stenosis. 3.  Hypoplastic right A1 segment. 4.  Occluded distal left vertebral artery.  The basilar artery is fed from the dominant right vertebral artery. 5.  Small left posterior communicating artery. 6.  Stable appearance of bilateral lacunar infarcts of the basal ganglia and left upper brain stem.  Original Report Authenticated By: Jamesetta Orleans. MATTERN, M.D.   US Renal  09/19/2011  *RADIOLOGY REPORT*  Clinical Data: Chronic renal failure, evaluate for obstruction  RENAL/URINARY TRACT ULTRASOUND COMPLETE  Comparison:  CT angio abdomen pelvis of 07/15/2011  Findings:  Right Kidney:  No hydronephrosis is seen.  The right kidney measures 10.9 cm sagittally.  The echogenicity of the right kidney is within normal limits.  Left Kidney:  No hydronephrosis is noted.  The left kidney measures 11.2 cm, and the parenchyma also is normal in echogenicity.  Bladder:  The urinary bladder is unremarkable.  The prostate measures 4.7 cm in maximum diameter.  IMPRESSION: No hydronephrosis.  Original Report  Authenticated By: Juline Patch, M.D.  Ct Head W Wo Contrast  09/11/2011  *RADIOLOGY REPORT*  Clinical Data: Memory loss.  Confusion.  Lethargy.  Unsteady gait. Coronary artery disease.   CT HEAD WITHOUT AND WITH CONTRAST  Technique:  Contiguous axial images were obtained from the base of the skull through the vertex without and with intravenous contrast.  Contrast: 80mL OMNIPAQUE IOHEXOL 300 MG/ML  SOLN  Comparison: None.  Findings: Multiple lacunar infarcts are present within the basal ganglia.  Some of the views are clearly chronic.  There is a lesion in the left caudate head and another in the right thalamus which are age indeterminate.  Mild periventricular subcortical white matter hypoattenuation is present otherwise.  The postcontrast images demonstrate to a no pathologic enhancement. The ventricles are of normal size.  No significant extra-axial fluid collection is present.  The paranasal sinuses and mastoid air cells are clear.  IMPRESSION:  1.  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  Although several are clearly remote, others are age indeterminate. MRI is more sensitive and specific for the evaluation of acute ischemia if the patient can tolerate an MRI. 2.  Mild generalized white matter disease likely reflects the sequelae of chronic microvascular ischemia.  Original Report Authenticated By: Jamesetta Orleans. MATTERN, M.D.   Cardiac Cath:  Nathan [name].  71 y.o.  male  1940-12-18  Procedure Date: 09/04/2011  Referring Physician: [facility]  Primary Cardiologist:: Tiburcio Pea, III  PROCEDURE: Left heart catheterization with selective coronary angiography, left ventriculogram.  INDICATIONS: Nuclear study performed at the [facility] demonstrating 3 zone myocardial ischemia with a large inferior and lateral wall defect and smaller mid anterior and apical ischemic zones.  The risks, benefits, and details of the procedure were explained to the patient. The patient verbalized understanding and wanted to proceed. Informed written consent was obtained.  PROCEDURE TECHNIQUE: After Xylocaine anesthesia a 5 French sheath was placed in the right femoral artery with a  single anterior needle wall stick. Coronary angiography was done using a 5 [place] A2MP catheter. Left ventriculography was done using a 5 [place] A2 MP catheter.  CONTRAST: Total of 70 cc.  COMPLICATIONS: None.  HEMODYNAMICS: Aortic pressure was 144/68 mmHg; LV pressure was 146/19 mmHg; LVEDP 29 mmHg. There was no gradient between the left ventricle and aorta.  ANGIOGRAPHIC DATA: The left main coronary artery is widely patent..  The left anterior descending artery is heavy calcification in the proximal to midsegment. Within this region there is obstruction in the 60-75% range. The LAD is large and wraps around the left ventricular apex. The second diagonal which is large contains ostial 50-70% stenosis..  The left circumflex artery is totally occluded after the first obtuse marginal. Proximal to the first marginal there is a 95% stenosis. The first marginal is small to moderate in size. 2 distal marginals fill late via left to left and right-to-left collaterals..  The right coronary artery is codominant with segmental 90% stenosis proximal and mid over a long segment. This vessel gives origin to left circumflex collaterals.Marland Kitchen  LEFT VENTRICULOGRAM: Left ventricular angiogram was done in the 30 RAO projection and revealed normal left ventricular wall motion and systolic function with an estimated ejection fraction of 50%.  IMPRESSIONS: 1. Significant 3 vessel coronary artery disease with circumflex total occlusion, high-grade diffuse obstruction in the RCA, and borderline significant mid LAD obstruction.  2. Left ventricular ejection fraction 50-55% with moderate inferobasal hypokinesis/akinesis.  3. Nuclear myocardial perfusion  study demonstrating 3 zone ischemia including the mid and apical LAD territory.  RECOMMENDATION:  1. Will have the patient seen by TCTS to determine the risk of surgical revascularization. Will also discuss with the patient percutaneous approaches including right coronary and LAD  stenting. We would not attempt circumflex PCI.  2. Aggressive hydration today with discharge later this evening.Ronny Flurry: PROCEDURE: L Pelvic angiogram. Selective left lower extremity angiogram. Selective right lower extremity angiogram. .  INDICATIONS: Claudication or  The risks, benefits, and details of the procedure were explained to the patient. The patient verbalized understanding and wanted to proceed. Informed written consent was obtained.  PROCEDURE TECHNIQUE: Right femoral access was obtained by Dr. Katrinka Blazing for cardiac catheterization. A pigtail catheter was advanced to the aortic bifurcation and a power injection of contrast was performed in the AP projection. We obtained access to the contralateral iliac system using an Omni flush catheter and Versa core wire. An endhole catheter was advanced to the left external iliac artery. A power injection of contrast was performed to the endhole catheter to image the left lower extremity vasculature. Through the right femoral sheath, a power injection of contrast was performed to visualize the right lower extremity vasculature.  CONTRAST: Total of 75 cc.  COMPLICATIONS: None.  ANGIOGRAPHIC DATA: The right common iliac artery is heavily calcified. The stent in the right common iliac artery does appear patent. The right internal iliac artery is occluded and fills by collaterals from the inferior mesenteric artery.. The right external iliac artery is widely patent. The right common femoral artery is patent. There is mild atherosclerosis. The right profunda femoral artery is widely patent. The right SFA has mild to moderate diffuse atherosclerosis. In the mid to distal vessel there is a focal 40% stenosis. The right popliteal artery is widely patent. There is mild atherosclerosis noted in the tibial vessels. All 3 tibial vessels are patent.  The left common iliac artery has moderate disease proximally. The left common iliac artery stent is patent with 25%  mid stent in-stent restenosis. The left internal iliac artery is occluded. This appears to fill by collaterals from the IMA. The left external iliac artery is patent. The left common femoral artery appears patent. The left profunda femoral artery is large. The left superficial femoral artery is occluded at the ostium. The left SFA reconstitutes in the midportion from collaterals from the left profunda femoral artery. The distal SFA is patent with only mild atherosclerosis. The left popliteal artery is widely patent. All 3 vessels are patent below the knee.  IMPRESSIONS:  1. Patent common iliac stents bilaterally. Occluded internal iliac arteries bilaterally.  2. Occluded left SFA from the proximal portion to the midportion. Collaterals from the left profunda femoral artery. Three-vessel runoff below the left knee.  3. Mild to moderate diffuse disease in the right SFA. Three-vessel runoff below the right knee.  RECOMMENDATION: He'll need management of his coronary artery disease per Dr. Katrinka Blazing. After this, he will followup with Dr. Myra Gianotti for further evaluation of his claudication. I encouraged him to stop smoking. He needs to try to continue walking to build circulation in his legs. He feels that he has failed medical therapy and I don't think he has any to percutaneous options. He is willing to consider left femoropopliteal bypass. Continue aspirin and lipid-lowering therapy.        Recent Lab Findings: Lab Results  Component Value Date   WBC 10.2 10/29/2011      Assessment / Plan:  1. Three vessel coronary artery occlusive disease, with postive stress test 2 COPD, long term tobacco use 3 Limiting claudication left greater then right with sever peripheral vascular disease 4 Hypertension 5 Long term alcohol heavy use 6 Six month history of mental status changes per patients wife, with bilateral carotid bruits, r/o stroke 7  Multiple lacunar infarcts of the basal ganglia. By CT of head this  week 8 Chronic Renal disease stage 3 reported from lab test done at [facility] Cr 1.8 9  Greater then 80 % right distal internal carotid stenosis, with evidence of bilateral lacunar infarcts and confirmed on CTA with disease 2 cm above the bifurcation of the carotid on the right  I discussed the case with Dr. Myra Gianotti who is reviewed the carotid Doppler studies, and seen the patient and has recommended proceeding with concomitant right carotid endarterectomy simultaneously with coronary artery bypass graft. We tentatively plan this for June 21.  The goals risks and alternatives of the planned surgical procedure coronary artery bypass grafting and carotid endarterectomy have been discussed with the patient in detail. The risks of the procedure including death, infection, stroke, myocardial infarction, bleeding, blood transfusion have all been discussed specifically.  I have quoted [name] a 5 % of perioperative mortality and a complication rate as high as 40%. The patient's questions have been answered.Nathan [name] is willing  to proceed with the planned procedure.   Delight Ovens MD  Beeper [phone] Office [phone] 10/31/2011 7:06 AM

## 2011-10-31 NOTE — Transfer of Care (Signed)
Immediate Anesthesia Transfer of Care Note  Patient: Nathan [name]  Procedure(s) Performed: Procedure(s) (LRB): CORONARY ARTERY BYPASS GRAFTING (CABG) (N/A) ENDARTERECTOMY CAROTID (Right)  Patient Location: SICU  Anesthesia Type: General  Level of Consciousness: Patient remains intubated per anesthesia plan  Airway & Oxygen Therapy: Patient remains intubated per anesthesia plan and Patient placed on Ventilator (see vital sign flow sheet for setting)  Post-op Assessment: Report given to PACU RN and Post -op Vital signs reviewed and stable  Post vital signs: Reviewed and stable  Complications: No apparent anesthesia complications

## 2011-10-31 NOTE — Progress Notes (Signed)
Patient ID: Nathan [name], male   DOB: [birth year], 71 y.o.   MRN: [id number]                   [address]            [place] [address]          [phone]     Day of Surgery Procedure(s) (LRB): CORONARY ARTERY BYPASS GRAFTING (CABG) (N/A) ENDARTERECTOMY CAROTID (Right)  Total Length of Stay:  LOS: 0 days  BP 121/61  Pulse 90  Temp 98.4 F (36.9 C) (Core (Comment))  Resp 12  Wt 201 lb (91.173 kg)  SpO2 100%     . sodium chloride 20 mL (10/31/11 1656)  . sodium chloride Stopped (10/31/11 1630)  . sodium chloride    . dexmedetomidine (PRECEDEX) IV infusion Stopped (10/31/11 1822)  . DOPamine 2 mcg/kg/min (10/31/11 1754)  . insulin (NOVOLIN-R) infusion 0.5 Units/hr (10/31/11 1830)  . lactated ringers 20 mL (10/31/11 1630)  . nitroGLYCERIN 5 mcg/min (10/31/11 1815)  . phenylephrine (NEO-SYNEPHRINE) Adult infusion Stopped (10/31/11 1730)     Lab Results  Component Value Date   WBC 9.2 10/31/2011   HGB 10.7* 10/31/2011   HCT 32.5* 10/31/2011   PLT 82* 10/31/2011   GLUCOSE 123* 10/31/2011   ALT 11 10/29/2011   AST 13 10/29/2011   NA 141 10/31/2011   K 4.1 10/31/2011   CL 104 10/29/2011   CREATININE 1.52* 10/29/2011   BUN 20 10/29/2011   CO2 22 10/29/2011   INR 1.57* 10/31/2011   HGBA1C 5.3 10/29/2011   Just starting to wake up, moves all extremities to command, wean vent as tolerated  Delight Ovens MD  Beeper [phone] Office [phone] 10/31/2011 7:55 PM

## 2011-11-01 ENCOUNTER — Inpatient Hospital Stay (HOSPITAL_COMMUNITY): Payer: Medicare PPO

## 2011-11-01 DIAGNOSIS — J95821 Acute postprocedural respiratory failure: Secondary | ICD-10-CM | POA: Diagnosis present

## 2011-11-01 DIAGNOSIS — J449 Chronic obstructive pulmonary disease, unspecified: Secondary | ICD-10-CM

## 2011-11-01 DIAGNOSIS — J411 Mucopurulent chronic bronchitis: Secondary | ICD-10-CM

## 2011-11-01 DIAGNOSIS — J9589 Other postprocedural complications and disorders of respiratory system, not elsewhere classified: Secondary | ICD-10-CM

## 2011-11-01 DIAGNOSIS — Z951 Presence of aortocoronary bypass graft: Secondary | ICD-10-CM

## 2011-11-01 LAB — POCT I-STAT 3, ART BLOOD GAS (G3+)
Acid-base deficit: 3 mmol/L — ABNORMAL HIGH (ref 0.0–2.0)
Acid-base deficit: 4 mmol/L — ABNORMAL HIGH (ref 0.0–2.0)
Acid-base deficit: 2 mmol/L (ref 0.0–2.0)
Acid-base deficit: 1 mmol/L (ref 0.0–2.0)
Bicarbonate: 22.5 mEq/L (ref 20.0–24.0)
Bicarbonate: 24.3 mEq/L — ABNORMAL HIGH (ref 20.0–24.0)
Bicarbonate: 25 mEq/L — ABNORMAL HIGH (ref 20.0–24.0)
Bicarbonate: 25.4 mEq/L — ABNORMAL HIGH (ref 20.0–24.0)
O2 Saturation: 94 %
O2 Saturation: 58 %
O2 Saturation: 97 %
O2 Saturation: 87 %
Patient temperature: 99.1
Patient temperature: 98.6
Patient temperature: 98.6
Patient temperature: 98.3
TCO2: 24 mmol/L (ref 0–100)
TCO2: 26 mmol/L (ref 0–100)
TCO2: 27 mmol/L (ref 0–100)
TCO2: 27 mmol/L (ref 0–100)
pCO2 arterial: 42 mmHg (ref 35.0–45.0)
pCO2 arterial: 56.7 mmHg — ABNORMAL HIGH (ref 35.0–45.0)
pCO2 arterial: 52.4 mmHg — ABNORMAL HIGH (ref 35.0–45.0)
pCO2 arterial: 51.3 mmHg — ABNORMAL HIGH (ref 35.0–45.0)
pH, Arterial: 7.339 — ABNORMAL LOW (ref 7.350–7.450)
pH, Arterial: 7.24 — ABNORMAL LOW (ref 7.350–7.450)
pH, Arterial: 7.286 — ABNORMAL LOW (ref 7.350–7.450)
pH, Arterial: 7.302 — ABNORMAL LOW (ref 7.350–7.450)
pO2, Arterial: 77 mmHg — ABNORMAL LOW (ref 80.0–100.0)
pO2, Arterial: 36 mmHg — CL (ref 80.0–100.0)
pO2, Arterial: 103 mmHg — ABNORMAL HIGH (ref 80.0–100.0)
pO2, Arterial: 59 mmHg — ABNORMAL LOW (ref 80.0–100.0)

## 2011-11-01 LAB — POCT I-STAT, CHEM 8
BUN: 23 mg/dL (ref 6–23)
Calcium, Ion: 1.27 mmol/L (ref 1.12–1.32)
Chloride: 104 mEq/L (ref 96–112)
Creatinine, Ser: 1.7 mg/dL — ABNORMAL HIGH (ref 0.50–1.35)
Glucose, Bld: 113 mg/dL — ABNORMAL HIGH (ref 70–99)
HCT: 33 % — ABNORMAL LOW (ref 39.0–52.0)
Hemoglobin: 11.2 g/dL — ABNORMAL LOW (ref 13.0–17.0)
Potassium: 4.9 mEq/L (ref 3.5–5.1)
Sodium: 141 mEq/L (ref 135–145)
TCO2: 25 mmol/L (ref 0–100)

## 2011-11-01 LAB — CBC
HCT: 33.5 % — ABNORMAL LOW (ref 39.0–52.0)
Hemoglobin: 11 g/dL — ABNORMAL LOW (ref 13.0–17.0)
MCH: 29.3 pg (ref 26.0–34.0)
MCHC: 32.8 g/dL (ref 30.0–36.0)
MCV: 87.2 fL (ref 78.0–100.0)
Platelets: 96 10*3/uL — ABNORMAL LOW (ref 150–400)
RDW: 15.6 % — ABNORMAL HIGH (ref 11.5–15.5)

## 2011-11-01 LAB — BASIC METABOLIC PANEL
Calcium: 8.1 mg/dL — ABNORMAL LOW (ref 8.4–10.5)
Creatinine, Ser: 1.49 mg/dL — ABNORMAL HIGH (ref 0.50–1.35)
GFR calc Af Amer: 52 mL/min — ABNORMAL LOW (ref >90)
GFR calc non Af Amer: 45 mL/min — ABNORMAL LOW (ref >90)

## 2011-11-01 LAB — GLUCOSE, CAPILLARY
Glucose-Capillary: 115 mg/dL — ABNORMAL HIGH (ref 70–99)
Glucose-Capillary: 132 mg/dL — ABNORMAL HIGH (ref 70–99)
Glucose-Capillary: 133 mg/dL — ABNORMAL HIGH (ref 70–99)
Glucose-Capillary: 138 mg/dL — ABNORMAL HIGH (ref 70–99)
Glucose-Capillary: 147 mg/dL — ABNORMAL HIGH (ref 70–99)
Glucose-Capillary: 158 mg/dL — ABNORMAL HIGH (ref 70–99)

## 2011-11-01 LAB — MAGNESIUM
Magnesium: 2.6 mg/dL — ABNORMAL HIGH (ref 1.5–2.5)
Magnesium: 2.5 mg/dL (ref 1.5–2.5)

## 2011-11-01 LAB — CREATININE, SERUM: GFR calc non Af Amer: 36 mL/min — ABNORMAL LOW (ref >90)

## 2011-11-01 MED ORDER — ENOXAPARIN SODIUM 30 MG/0.3ML ~~LOC~~ SOLN
30.0000 mg | SUBCUTANEOUS | Status: DC
  Administered 2011-11-01 – 2011-11-15 (×15): 30 mg via SUBCUTANEOUS
  Filled 2011-11-01 (×16): qty 0.3

## 2011-11-01 MED ORDER — BUDESONIDE 0.25 MG/2ML IN SUSP
0.2500 mg | Freq: Four times a day (QID) | RESPIRATORY_TRACT | Status: DC
  Administered 2011-11-01 – 2011-11-16 (×52): 0.25 mg via RESPIRATORY_TRACT
  Filled 2011-11-01 (×70): qty 2

## 2011-11-01 MED ORDER — SODIUM CHLORIDE 0.9 % IV SOLN
50.0000 ug/h | INTRAVENOUS | Status: DC
  Administered 2011-11-01: 100 ug/h via INTRAVENOUS
  Filled 2011-11-01 (×2): qty 50

## 2011-11-01 MED ORDER — DEXTROSE-NACL 5-0.45 % IV SOLN
INTRAVENOUS | Status: DC
  Administered 2011-11-01: 1000 mL via INTRAVENOUS

## 2011-11-01 MED ORDER — ACETYLCYSTEINE 10 % IN SOLN
4.0000 mL | Freq: Four times a day (QID) | RESPIRATORY_TRACT | Status: DC
  Filled 2011-11-01: qty 4

## 2011-11-01 MED ORDER — FUROSEMIDE 10 MG/ML IJ SOLN
40.0000 mg | Freq: Once | INTRAMUSCULAR | Status: AC
  Administered 2011-11-01: 40 mg via INTRAVENOUS
  Filled 2011-11-01: qty 4

## 2011-11-01 MED ORDER — LEVALBUTEROL HCL 0.63 MG/3ML IN NEBU
0.6300 mg | INHALATION_SOLUTION | Freq: Four times a day (QID) | RESPIRATORY_TRACT | Status: DC
  Administered 2011-11-01 – 2011-11-11 (×38): 0.63 mg via RESPIRATORY_TRACT
  Filled 2011-11-01 (×43): qty 3

## 2011-11-01 MED ORDER — SODIUM CHLORIDE 0.9 % IV SOLN
2.0000 mg/h | INTRAVENOUS | Status: DC
  Administered 2011-11-01: 2 mg/h via INTRAVENOUS
  Filled 2011-11-01 (×3): qty 10

## 2011-11-01 MED ORDER — FENTANYL BOLUS VIA INFUSION
50.0000 ug | Freq: Four times a day (QID) | INTRAVENOUS | Status: DC | PRN
  Filled 2011-11-01: qty 100

## 2011-11-01 MED ORDER — MIDAZOLAM BOLUS VIA INFUSION
1.0000 mg | INTRAVENOUS | Status: DC | PRN
  Filled 2011-11-01: qty 2

## 2011-11-01 MED ORDER — ACETYLCYSTEINE 20 % IN SOLN
4.0000 mL | Freq: Four times a day (QID) | RESPIRATORY_TRACT | Status: DC
  Administered 2011-11-01: 4 mL via RESPIRATORY_TRACT
  Filled 2011-11-01: qty 4

## 2011-11-01 MED ORDER — BUDESONIDE 0.25 MG/2ML IN SUSP
0.2500 mg | Freq: Four times a day (QID) | RESPIRATORY_TRACT | Status: DC
  Filled 2011-11-01 (×3): qty 2

## 2011-11-01 MED ORDER — ACETYLCYSTEINE 20 % IN SOLN
4.0000 mL | Freq: Four times a day (QID) | RESPIRATORY_TRACT | Status: AC
  Administered 2011-11-02 – 2011-11-03 (×8): 4 mL via RESPIRATORY_TRACT
  Filled 2011-11-01 (×14): qty 4

## 2011-11-01 MED ORDER — INSULIN ASPART 100 UNIT/ML ~~LOC~~ SOLN: 0.0000 [IU] | SUBCUTANEOUS | Status: DC

## 2011-11-01 NOTE — Progress Notes (Signed)
Pt not tolerating PRVC mode on vent. Pt air-trapping. RT trialed pt on PC, seems to tolerate much better. RT called to [facility] for PC order. Repeat ABG in one hour. RT will continue to monitor.

## 2011-11-01 NOTE — Progress Notes (Signed)
Patient ID: KEMON DEVINCENZI, male   DOB: 1940-10-27, 71 y.o.   MRN: [id number] TCTS DAILY PROGRESS NOTE                   [address]            [place] [address]          [phone]      1 Day Post-Op Procedure(s) (LRB): CORONARY ARTERY BYPASS GRAFTING (CABG) (N/A) ENDARTERECTOMY CAROTID (Right)  Total Length of Stay:  LOS: 1 day   Subjective: Extubated, awake and alert cooperative  Objective: Vital signs in last 24 hours: Temp:  [97.3 F (36.3 C)-99.7 F (37.6 C)] 97.7 F (36.5 C) (06/22 0800) Pulse Rate:  [90-91] 91  (06/22 0800) Cardiac Rhythm:  [-] Normal sinus rhythm (06/22 0800) Resp:  [7-20] 19  (06/22 0700) BP: (91-147)/(41-75) 147/66 mmHg (06/22 0800) SpO2:  [99 %-100 %] 100 % (06/22 0901) Arterial Line BP: (92-164)/(46-77) 161/61 mmHg (06/22 0800) FiO2 (%):  [39.7 %-70.2 %] 39.7 % (06/22 0300) Weight:  [212 lb 8.4 oz (96.4 kg)] 212 lb 8.4 oz (96.4 kg) (06/22 0700)  Filed Weights   10/31/11 0627 11/01/11 0700  Weight: 201 lb (91.173 kg) 212 lb 8.4 oz (96.4 kg)    Weight change: 11 lb 8.4 oz (5.227 kg)   Hemodynamic parameters for last 24 hours: PAP: (18-30)/(9-18) 23/12 mmHg CO:  [3.9 L/min-5.9 L/min] 5.9 L/min CI:  [1.8 L/min/m2-2.8 L/min/m2] 2.8 L/min/m2  Intake/Output from previous day: 06/21 0701 - 06/22 0700 In: 6790 [P.O.:90; I.V.:4856; Blood:804; NG/GT:90; IV Piggyback:950] Out: 4084 [Urine:2650; Emesis/NG output:25; Drains:19; Blood:1000; Chest Tube:390]  Intake/Output this shift: Total I/O In: 120 [I.V.:120] Out: -   Current Meds: Scheduled Meds:   . acetaminophen (TYLENOL) oral liquid 160 mg/5 mL  650 mg Per Tube NOW   Or  . acetaminophen  650 mg Rectal NOW  . acetaminophen  1,000 mg Oral Q6H   Or  . acetaminophen (TYLENOL) oral liquid 160 mg/5 mL  975 mg Per Tube Q6H  . allopurinol  300 mg Oral Daily  . aspirin EC  325 mg Oral Daily   Or  . aspirin  324 mg Per Tube Daily  . bisacodyl  10 mg Oral Daily   Or    . bisacodyl  10 mg Rectal Daily  . buPROPion  150 mg Oral BID  . cefUROXime (ZINACEF)  IV  1.5 g Intravenous To OR  . cefUROXime (ZINACEF)  IV  1.5 g Intravenous Q12H  . colchicine  0.6 mg Oral Daily  . dexmedetomidine (PRECEDEX) IV infusion for high rates  0.1-0.7 mcg/kg/hr (Order-Specific) Intravenous To OR  . docusate sodium  200 mg Oral Daily  . DOPamine  2-20 mcg/kg/min (Order-Specific) Intravenous To OR  . famotidine (PEPCID) IV  20 mg Intravenous Q12H  . insulin aspart  0-24 Units Subcutaneous Q2H   Followed by  . insulin aspart  0-24 Units Subcutaneous Q4H  . insulin (NOVOLIN-R) infusion   Intravenous To OR  . insulin regular  0-10 Units Intravenous TID WC  . levalbuterol  0.63 mg Nebulization TID  . magnesium sulfate  4 g Intravenous Once  . metoprolol tartrate  12.5 mg Oral BID   Or  . metoprolol tartrate  12.5 mg Per Tube BID  . nitroglycerin-nicardipine-HEPARIN-sodium bicarbonate irrigation for artery spasm   Irrigation To OR  . pantoprazole  40 mg Oral Q1200  . phenylephrine (NEO-SYNEPHRINE) Adult infusion  30-200 mcg/min Intravenous To  OR  . potassium chloride  10 mEq Intravenous Q1 Hr x 3  . simvastatin  10 mg Oral QHS  . sodium chloride  3 mL Intravenous Q12H  . tranexamic acid  15 mg/kg (Order-Specific) Intravenous To OR  . tranexamic acid (CYKLOKAPRON) infusion ([facility])  1.5 mg/kg/hr (Order-Specific) Intravenous To OR  . vancomycin  1,000 mg Intravenous Once                                             Continuous Infusions:   . sodium chloride 20 mL/hr at 11/01/11 0700  . sodium chloride Stopped (10/31/11 1630)  . sodium chloride    . dexmedetomidine (PRECEDEX) IV infusion Stopped (10/31/11 1822)  . DOPamine Stopped (10/31/11 2000)  . insulin (NOVOLIN-R) infusion Stopped (10/31/11 2005)  . lactated ringers 20 mL/hr at 11/01/11 0700  . nitroGLYCERIN 04.54098 mcg/min (11/01/11 0700)  . phenylephrine (NEO-SYNEPHRINE) Adult infusion Stopped (10/31/11 1730)    PRN Meds:.albumin human, lactated ringers, metoprolol, midazolam, morphine injection, morphine injection, ondansetron (ZOFRAN) IV, oxyCODONE, sodium chloride, DISCONTD: 0.9 % irrigation (POUR BTL), DISCONTD: hemostatic agents, DISCONTD: hemostatic agents, DISCONTD: hemostatic agents, DISCONTD: heparin 6000 unit irrigation, DISCONTD: Surgifoam 1 Gm with 0.9% sodium chloride (4 ml) topical solution DISCONTD: Surgifoam 1 Gm with 0.9% sodium chloride (4 ml) topical solution, DISCONTD: Surgifoam 1 Gm with 0.9% sodium chloride (4 ml) topical solution, DISCONTD: Surgifoam 1 Gm with 0.9% sodium chloride (4 ml) topical solution  General appearance: alert and cooperative Neurologic: intact Heart: regular rate and rhythm, S1, S2 normal, no murmur, click, rub or gallop and normal apical impulse Lungs: clear to auscultation bilaterally Abdomen: normal findings: bowel sounds normal Extremities: extremities normal, atraumatic, no cyanosis or edema and Homans sign is negative, no sign of DVT Wound: stable   Lab Results: CBC: Basename 11/01/11 0315 10/31/11 2250 10/31/11 2230  WBC 12.9* -- 11.4*  HGB 10.3* 10.2* --  HCT 30.7* 30.0* --  PLT 96* -- 89*   BMET:  Basename 11/01/11 0315 10/31/11 2250 10/29/11 0943  NA 139 142 --  K 4.5 4.7 --  CL 108 106 --  CO2 22 -- 22  GLUCOSE 146* 125* --  BUN 19 18 --  CREATININE 1.49* 1.50* --  CALCIUM 8.1* -- 9.4    PT/INR:  Basename 10/31/11 1630  LABPROT 19.1*  INR 1.57*   Radiology: Dg Chest Portable 1 View  10/31/2011  *RADIOLOGY REPORT*  Clinical Data: Postop CABG and right carotid endarterectomy  PORTABLE CHEST - 1 VIEW  Comparison: Chest x-ray of 10/29/2011  Findings: The tip of the endotracheal tube is approximately 6.4 cm above the carina.  A Swan-Ganz catheter is present with the tip in the right lower lobe pulmonary artery.  No pneumothorax is seen. There is basilar atelectasis present particularly at the right lung base.  Cardiomegaly is stable.   NG tube is noted with the tip at the GE junction.  IMPRESSION:  1.  Endotracheal tube 6.4 cm above the carina. 2.  Swan-Ganz catheter tip in right lower lobe pulmonary artery. 3.  Basilar atelectasis, right greater than left.  Original Report Authenticated By: Juline Patch, M.D.     Assessment/Plan: S/P Procedure(s) (LRB): CORONARY ARTERY BYPASS GRAFTING (CABG) (N/A) ENDARTERECTOMY CAROTID (Right) Mobilize Diuresis d/c tubes/lines Continue foley due to strict I&O, patient in ICU and urinary output monitoring See progression orders Expected Acute  Blood -  loss Anemia Renal function stable from preop   Delight Ovens MD  Beeper [phone] Office [phone] 11/01/2011 9:25 AM

## 2011-11-01 NOTE — Progress Notes (Signed)
Patient ID: Nathan [name], male   DOB: 09-08-1940, 71 y.o.   MRN: [id number] TCTS DAILY PROGRESS NOTE                   [address]            [place] [address]          [phone]      1 Day Post-Op Procedure(s) (LRB): CORONARY ARTERY BYPASS GRAFTING (CABG) (N/A) ENDARTERECTOMY CAROTID (Right)  Total Length of Stay:  LOS: 1 day   Subjective: Sudden increased work of breathing and low Po2., neuro intact  Objective: Vital signs in last 24 hours: Temp:  [97.3 F (36.3 C)-99.7 F (37.6 C)] 97.8 F (36.6 C) (06/22 1608) Pulse Rate:  [82-104] 82  (06/22 1600) Cardiac Rhythm:  [-] Normal sinus rhythm (06/22 0800) Resp:  [7-34] 26  (06/22 1600) BP: (99-169)/(51-83) 129/56 mmHg (06/22 1600) SpO2:  [92 %-100 %] 92 % (06/22 1600) Arterial Line BP: (111-164)/(52-77) 130/55 mmHg (06/22 1100) FiO2 (%):  [39.7 %-50.5 %] 39.7 % (06/22 0300) Weight:  [212 lb 8.4 oz (96.4 kg)] 212 lb 8.4 oz (96.4 kg) (06/22 0700)  Filed Weights   10/31/11 0627 11/01/11 0700  Weight: 201 lb (91.173 kg) 212 lb 8.4 oz (96.4 kg)    Weight change: 11 lb 8.4 oz (5.227 kg)   Hemodynamic parameters for last 24 hours: PAP: (18-30)/(11-18) 24/12 mmHg CO:  [3.9 L/min-5.9 L/min] 5.9 L/min CI:  [1.8 L/min/m2-2.8 L/min/m2] 2.8 L/min/m2  Intake/Output from previous day: 06/21 0701 - 06/22 0700 In: 6790 [P.O.:90; I.V.:4856; Blood:804; NG/GT:90; IV Piggyback:950] Out: 4084 [Urine:2650; Emesis/NG output:25; Drains:19; Blood:1000; Chest Tube:390]  Intake/Output this shift: Total I/O In: 470 [I.V.:420; IV Piggyback:50] Out: 1400 [Urine:1350; Chest Tube:50]  Current Meds: Scheduled Meds:   . acetaminophen  1,000 mg Oral Q6H   Or  . acetaminophen (TYLENOL) oral liquid 160 mg/5 mL  975 mg Per Tube Q6H  . acetylcysteine  4 mL Nebulization QID  . allopurinol  300 mg Oral Daily  . aspirin EC  325 mg Oral Daily   Or  . aspirin  324 mg Per Tube Daily  . bisacodyl  10 mg Oral Daily   Or  .  bisacodyl  10 mg Rectal Daily  . budesonide  0.25 mg Nebulization Q6H  . buPROPion  150 mg Oral BID  . cefUROXime (ZINACEF)  IV  1.5 g Intravenous Q12H  . colchicine  0.6 mg Oral Daily  . docusate sodium  200 mg Oral Daily  . enoxaparin (LOVENOX) injection  30 mg Subcutaneous Q24H  . famotidine (PEPCID) IV  20 mg Intravenous Q12H  . furosemide  40 mg Intravenous Once  . insulin aspart  0-24 Units Subcutaneous Q2H   Followed by  . insulin aspart  0-24 Units Subcutaneous Q4H  . insulin regular  0-10 Units Intravenous TID WC  . levalbuterol  0.63 mg Nebulization Q6H  . magnesium sulfate  4 g Intravenous Once  . metoprolol tartrate  12.5 mg Oral BID   Or  . metoprolol tartrate  12.5 mg Per Tube BID  . pantoprazole  40 mg Oral Q1200  . potassium chloride  10 mEq Intravenous Q1 Hr x 3  . simvastatin  10 mg Oral QHS  . sodium chloride  3 mL Intravenous Q12H  . vancomycin  1,000 mg Intravenous Once  . DISCONTD: insulin aspart  0-24 Units Subcutaneous Q4H  . DISCONTD: levalbuterol  0.63 mg Nebulization TID  Continuous Infusions:   . sodium chloride 20 mL/hr at 11/01/11 0700  . sodium chloride Stopped (10/31/11 1630)  . sodium chloride    . dexmedetomidine (PRECEDEX) IV infusion Stopped (10/31/11 1822)  . dextrose 5 % and 0.45% NaCl    . DOPamine Stopped (10/31/11 2000)  . fentaNYL infusion INTRAVENOUS    . insulin (NOVOLIN-R) infusion Stopped (10/31/11 2005)  . lactated ringers 20 mL/hr at 11/01/11 0700  . midazolam (VERSED) infusion    . nitroGLYCERIN Stopped (11/01/11 1100)  . phenylephrine (NEO-SYNEPHRINE) Adult infusion Stopped (10/31/11 1730)   PRN Meds:.albumin human, fentaNYL, lactated ringers, metoprolol, midazolam, midazolam, morphine injection, morphine injection, ondansetron (ZOFRAN) IV, oxyCODONE, sodium chloride  General appearance: cooperative, moderate distress and slowed mentation Neurologic: intact Heart: regular rate and rhythm, S1, S2 normal, no murmur, click,  rub or gallop Lungs: diminished breath sounds RLL, RML and RUL and rhonchi bilaterally  Lab Results: CBC: Basename 11/01/11 1636 11/01/11 1630 11/01/11 0315  WBC -- 19.3* 12.9*  HGB 11.2* 11.0* --  HCT 33.0* 33.5* --  PLT -- 105* 96*   BMET:  Basename 11/01/11 1636 11/01/11 1630 11/01/11 0315  NA 141 -- 139  K 4.9 -- 4.5  CL 104 -- 108  CO2 -- -- 22  GLUCOSE 113* -- 146*  BUN 23 -- 19  CREATININE 1.70* 1.79* --  CALCIUM -- -- 8.1*    PT/INR:  Basename 10/31/11 1630  LABPROT 19.1*  INR 1.57*   Radiology: Dg Chest Portable 1 View In Am  11/01/2011  *RADIOLOGY REPORT*  Clinical Data: History of CABG.  PORTABLE CHEST - 1 VIEW  Comparison: 10/31/2011  Findings: The endotracheal tube has been removed. Evidence for midline chest drains which are in stable position.  No evidence for pneumothorax.  Swan-Ganz catheter in the distal right pulmonary artery and stable.  There are increased bibasilar densities most likely representing atelectasis and small effusions.  Heart size is within normal limits.  Postsurgical changes in lower cervical spine.  IMPRESSION: Removal of endotracheal tube.  Increased basilar densities suggest atelectasis and small effusions.  Negative for pneumothorax.  Original Report Authenticated By: Richarda Overlie, M.D.   Dg Chest Portable 1 View  10/31/2011  *RADIOLOGY REPORT*  Clinical Data: Postop CABG and right carotid endarterectomy  PORTABLE CHEST - 1 VIEW  Comparison: Chest x-ray of 10/29/2011  Findings: The tip of the endotracheal tube is approximately 6.4 cm above the carina.  A Swan-Ganz catheter is present with the tip in the right lower lobe pulmonary artery.  No pneumothorax is seen. There is basilar atelectasis present particularly at the right lung base.  Cardiomegaly is stable.  NG tube is noted with the tip at the GE junction.  IMPRESSION:  1.  Endotracheal tube 6.4 cm above the carina. 2.  Swan-Ganz catheter tip in right lower lobe pulmonary artery. 3.  Basilar  atelectasis, right greater than left.  Original Report Authenticated By: Juline Patch, M.D.     Assessment/Plan: S/P Procedure(s) (LRB): CORONARY ARTERY BYPASS GRAFTING (CABG) (N/A) ENDARTERECTOMY CAROTID (Right) Acute on chronic renal failure (baseline  1.49, gfr 45 now 1.79 GFR 36) Pulmonary consult and proceed with intubation  Sputum culture, continue antibiotics  pending sputum culture   Delight Ovens MD  Beeper [phone] Office [phone] 11/01/2011 6:11 PM

## 2011-11-01 NOTE — Progress Notes (Signed)
Pt noted to be in acute respiratory distress.  SPO2 in 60-70's and pt alert and following commands.  Pt placed on venti mask. RT called and pt NTS x2. Pt noted to have extremely thick secretions.  ABG obtained and results called  To Dr. Tyrone Sage.  Orders obtained and CCM called to bedside for emergent intubation.

## 2011-11-01 NOTE — Procedures (Signed)
Extubation Procedure Note  Patient Details:   Name: Nathan [name] DOB: [birth year] MRN: [id number]   Airway Documentation:  Airway 8.5 mm (Active)  Secured at (cm) 23 cm 11/01/2011 12:00 AM  Measured From Lips 11/01/2011 12:00 AM  Secured Location Left 10/31/2011  4:26 PM  Secured By Pink Tape 11/01/2011 12:00 AM    Evaluation  O2 sats: stable throughout Complications: No apparent complications Patient did tolerate procedure well. Bilateral Breath Sounds: Diminished   Yes NIF -32/FVC.9L/positive leak test Charletta Cousin [facility] 11/01/2011, 3:46 AM

## 2011-11-01 NOTE — Progress Notes (Signed)
PULMONARY/CCM CONSULT NOTE  Requesting MD/Service: Tyrone Sage Date of admission: 6/21 Date of consult: 6/22 Reason for consultation: post operative respiratory failure, s/p re-intubation 6/22  Pt Profile: 70 yowm recalcitrant smoker who underwent elective CABG and R CEA 6/21. Extubated per TCTS protocol post operatively but developed acute respiratory distress with severe hypoxemia and purulent respiratory secretions. Re-intubated 6/22.      HPI: History as above. He was unable to provide further history due to imminent need for intubation upon my arrival   Past Medical History  Diagnosis Date  . Aorto-iliac disease     bilat with stent 2007  . CAD (coronary artery disease)     with loop to the circumflex 2000, inferolateral infarction  . Hypertension   . Paroxysmal atrial flutter     ablation  . ED (erectile dysfunction)   . COPD (chronic obstructive pulmonary disease)   . Hyperlipidemia   . Stroke   . Shortness of breath   . GERD (gastroesophageal reflux disease)   . Gout   Moderate obstruction by PFTs 09/2011  MEDICATIONS: home and current meds reviewed  History   Social History  . Marital Status: Married    Spouse Name: N/A    Number of Children: N/A  . Years of Education: N/A   Occupational History  . retired    Social History Main Topics  . Smoking status: active Current Everyday Smoker -- 1.0 packs/day for 60 years    Types: Cigarettes  . Smokeless tobacco: Never Used   Comment: smoking cessation info given and reviewed  . 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 Drug Use: No  . Sexually Active: Not on file   Other Topics Concern  . Not on file   Social History Narrative  . No narrative on file    Family History  Problem Relation Age of Onset  . Cancer Mother     ROS - unable to provide  Filed Vitals:   11/01/11 1930 11/01/11 2000  11/01/11 2010 11/01/11 2023  BP: 141/61  140/62   Pulse: 86  84   Temp:  98.3 F (36.8 C)    TempSrc:      Resp: 0  19   Weight:      SpO2: 99%  100% 100%    EXAM:  Gen: lethargic, poorly oriented, not F/C, acute resp distress HEENT: WNL Neck: JVP not visualized, s/p R CEA Lungs: diffuse rhonchi, diminished BS in R base Cardiovascular: RRR with extrasystoles, no M heard Abdomen: soft, NT, NABS Ext: No edema Neuro: MAEs, CNs intact Skin:   DATA:  BMET    Component Value Date/Time   NA 141 11/01/2011 1636   K 4.9 11/01/2011 1636   CL 104 11/01/2011 1636   CO2 22 11/01/2011 0315   GLUCOSE 113* 11/01/2011 1636   BUN 23 11/01/2011 1636   CREATININE 1.70* 11/01/2011 1636   CREATININE 1.58* 09/18/2011 1224   CALCIUM 8.1* 11/01/2011 0315   GFRNONAA 36* 11/01/2011 1630   GFRAA 42* 11/01/2011 1630    CBC    Component Value Date/Time   WBC 19.3* 11/01/2011 1630   RBC 3.77* 11/01/2011 1630   HGB 11.2* 11/01/2011 1636   HCT 33.0* 11/01/2011 1636   PLT 105* 11/01/2011 1630   MCV 88.9 11/01/2011 1630   MCH 29.2 11/01/2011 1630  MCHC 32.8 11/01/2011 1630   RDW 15.9* 11/01/2011 1630    CXR: bibasilar atx/infiltrates   IMPRESSION:   Principal Problem:  *Respiratory failure, post-operative Active Problems:  COPD (chronic obstructive pulmonary disease)  Occlusion and stenosis of carotid artery without mention of cerebral infarction  Purulent bronchitis  S/P CABG (coronary artery bypass graft)   PLAN:  Vent settings established Sedation protocol Scheduled bronchodilators DVT prophylaxis with LMWH adjusted for renal insufficiency PPI for SUP   Discussed with Dr Tyrone Sage  35 mins CCM time  [name], MD ; [facility] service Mobile [phone].  After 5:30 PM or weekends, call [phone]

## 2011-11-01 NOTE — Progress Notes (Signed)
[facility] [place]  Subjective  - POD #1 s/p R CEA / CABG  Extubated last night  No complaints this am    Physical Exam:  Incision looks good Neurologically intact Mild marginal mandibular neuropraxia     Assessment/Plan:  POD #1  D/c drain once can confirm output Activity per Dr. Roselind Rily IV, V. WELLS 11/01/2011 8:32 AM --  Ceasar Mons Vitals:   11/01/11 0700  BP: 124/66  Pulse: 90  Temp: 97.9 F (36.6 C)  Resp: 19    Intake/Output Summary (Last 24 hours) at 11/01/11 0832 Last data filed at 11/01/11 0700  Gross per 24 hour  Intake 6789.98 ml  Output   4084 ml  Net 2705.98 ml     Laboratory CBC    Component Value Date/Time   WBC 12.9* 11/01/2011 0315   HGB 10.3* 11/01/2011 0315   HCT 30.7* 11/01/2011 0315   PLT 96* 11/01/2011 0315    BMET    Component Value Date/Time   NA 139 11/01/2011 0315   K 4.5 11/01/2011 0315   CL 108 11/01/2011 0315   CO2 22 11/01/2011 0315   GLUCOSE 146* 11/01/2011 0315   BUN 19 11/01/2011 0315   CREATININE 1.49* 11/01/2011 0315   CREATININE 1.58* 09/18/2011 1224   CALCIUM 8.1* 11/01/2011 0315   GFRNONAA 45* 11/01/2011 0315   GFRAA 52* 11/01/2011 0315    COAG Lab Results  Component Value Date   INR 1.57* 10/31/2011   INR 1.02 10/29/2011   No results found for this basename: PTT    Antibiotics Anti-infectives     Start     Dose/Rate Route Frequency Ordered Stop   10/31/11 2215   vancomycin (VANCOCIN) IVPB 1000 mg/200 mL premix        1,000 mg 200 mL/hr over 60 Minutes Intravenous  Once 10/31/11 1616 10/31/11 2300   10/31/11 2200   cefUROXime (ZINACEF) 1.5 g in dextrose 5 % 50 mL IVPB        1.5 g 100 mL/hr over 30 Minutes Intravenous Every 12 hours 10/31/11 1616 11/02/11 2159   10/31/11 0400   vancomycin (VANCOCIN) 1,500 mg in sodium chloride 0.9 % 250 mL IVPB        1,500 mg 125 mL/hr over 120 Minutes Intravenous To Surgery 10/30/11 1315 10/31/11 0815   10/31/11 0400   cefUROXime  (ZINACEF) 1.5 g in dextrose 5 % 50 mL IVPB        1.5 g 100 mL/hr over 30 Minutes Intravenous To Surgery 10/30/11 1315 10/31/11 1445   10/31/11 0400   cefUROXime (ZINACEF) 750 mg in dextrose 5 % 50 mL IVPB  Status:  Discontinued        750 mg 100 mL/hr over 30 Minutes Intravenous To Surgery 10/30/11 1315 10/31/11 1542           V. Charlena Cross, M.D. [facility] [place] Office: [phone] Pager:  [phone]

## 2011-11-01 NOTE — Procedures (Signed)
Intubation Procedure Note Nathan [name] [id number] 30-Apr-1941  Procedure: Intubation Indications: Respiratory insufficiency  Procedure Details Consent: Unable to obtain consent because of emergent medical necessity. Time Out: Verified patient identification, verified procedure, site/side was marked, verified correct patient position, special equipment/implants available, medications/allergies/relevent history reviewed, required imaging and test results available.  Performed  Etomidate 20 mg Rocuronium 30 mg 4 MAC blade 8.0 ETT Cords easily visualized Passed on first attempt   Evaluation Hemodynamic Status: BP stable throughout; O2 sats: stable throughout Patient's Current Condition: stable Complications: No apparent complications Patient did tolerate procedure well. Chest X-ray ordered to verify placement.  CXR: tube position acceptable.   Nathan [name] 11/01/2011

## 2011-11-02 ENCOUNTER — Inpatient Hospital Stay (HOSPITAL_COMMUNITY): Payer: Medicare PPO

## 2011-11-02 DIAGNOSIS — I4891 Unspecified atrial fibrillation: Secondary | ICD-10-CM | POA: Diagnosis not present

## 2011-11-02 LAB — POCT I-STAT 3, ART BLOOD GAS (G3+)
Acid-base deficit: 1 mmol/L (ref 0.0–2.0)
Bicarbonate: 25.9 mEq/L — ABNORMAL HIGH (ref 20.0–24.0)
O2 Saturation: 99 %
Patient temperature: 99.3
TCO2: 27 mmol/L (ref 0–100)
pCO2 arterial: 52.7 mmHg — ABNORMAL HIGH (ref 35.0–45.0)
pH, Arterial: 7.301 — ABNORMAL LOW (ref 7.350–7.450)
pO2, Arterial: 153 mmHg — ABNORMAL HIGH (ref 80.0–100.0)

## 2011-11-02 LAB — BASIC METABOLIC PANEL
BUN: 31 mg/dL — ABNORMAL HIGH (ref 6–23)
BUN: 40 mg/dL — ABNORMAL HIGH (ref 6–23)
CO2: 28 mEq/L (ref 19–32)
CO2: 25 mEq/L (ref 19–32)
Calcium: 8.4 mg/dL (ref 8.4–10.5)
Calcium: 8.1 mg/dL — ABNORMAL LOW (ref 8.4–10.5)
Chloride: 105 mEq/L (ref 96–112)
Chloride: 102 mEq/L (ref 96–112)
Creatinine, Ser: 2.48 mg/dL — ABNORMAL HIGH (ref 0.50–1.35)
Creatinine, Ser: 2.98 mg/dL — ABNORMAL HIGH (ref 0.50–1.35)
GFR calc Af Amer: 28 mL/min — ABNORMAL LOW (ref >90)
GFR calc Af Amer: 23 mL/min — ABNORMAL LOW (ref >90)
GFR calc non Af Amer: 24 mL/min — ABNORMAL LOW (ref >90)
GFR calc non Af Amer: 20 mL/min — ABNORMAL LOW (ref >90)
Glucose, Bld: 121 mg/dL — ABNORMAL HIGH (ref 70–99)
Glucose, Bld: 120 mg/dL — ABNORMAL HIGH (ref 70–99)
Potassium: 5.3 mEq/L — ABNORMAL HIGH (ref 3.5–5.1)
Potassium: 4.8 mEq/L (ref 3.5–5.1)
Sodium: 140 mEq/L (ref 135–145)
Sodium: 135 mEq/L (ref 135–145)

## 2011-11-02 LAB — GLUCOSE, CAPILLARY
Glucose-Capillary: 104 mg/dL — ABNORMAL HIGH (ref 70–99)
Glucose-Capillary: 107 mg/dL — ABNORMAL HIGH (ref 70–99)
Glucose-Capillary: 109 mg/dL — ABNORMAL HIGH (ref 70–99)
Glucose-Capillary: 113 mg/dL — ABNORMAL HIGH (ref 70–99)
Glucose-Capillary: 115 mg/dL — ABNORMAL HIGH (ref 70–99)
Glucose-Capillary: 123 mg/dL — ABNORMAL HIGH (ref 70–99)
Glucose-Capillary: 137 mg/dL — ABNORMAL HIGH (ref 70–99)

## 2011-11-02 LAB — CBC
HCT: 28 % — ABNORMAL LOW (ref 39.0–52.0)
Hemoglobin: 9.2 g/dL — ABNORMAL LOW (ref 13.0–17.0)
MCH: 30 pg (ref 26.0–34.0)
MCHC: 32.9 g/dL (ref 30.0–36.0)
MCV: 91.2 fL (ref 78.0–100.0)
Platelets: 77 10*3/uL — ABNORMAL LOW (ref 150–400)
RBC: 3.07 MIL/uL — ABNORMAL LOW (ref 4.22–5.81)
RDW: 16.2 % — ABNORMAL HIGH (ref 11.5–15.5)
WBC: 10.4 10*3/uL (ref 4.0–10.5)

## 2011-11-02 LAB — HEPATIC FUNCTION PANEL
ALT: 8 U/L (ref 0–53)
AST: 16 U/L (ref 0–37)
Albumin: 2.6 g/dL — ABNORMAL LOW (ref 3.5–5.2)
Alkaline Phosphatase: 48 U/L (ref 39–117)
Bilirubin, Direct: 0.2 mg/dL (ref 0.0–0.3)
Indirect Bilirubin: 0.2 mg/dL — ABNORMAL LOW (ref 0.3–0.9)
Total Bilirubin: 0.4 mg/dL (ref 0.3–1.2)
Total Protein: 5 g/dL — ABNORMAL LOW (ref 6.0–8.3)

## 2011-11-02 LAB — TSH: TSH: 0.713 u[IU]/mL (ref 0.350–4.500)

## 2011-11-02 MED ORDER — AMIODARONE LOAD VIA INFUSION
150.0000 mg | Freq: Once | INTRAVENOUS | Status: AC
  Administered 2011-11-02: 150 mg via INTRAVENOUS
  Filled 2011-11-02: qty 83.34

## 2011-11-02 MED ORDER — FUROSEMIDE 10 MG/ML IJ SOLN
40.0000 mg | Freq: Once | INTRAMUSCULAR | Status: AC
  Administered 2011-11-02: 40 mg via INTRAVENOUS
  Filled 2011-11-02: qty 4

## 2011-11-02 MED ORDER — DOCUSATE SODIUM 50 MG/5ML PO LIQD
200.0000 mg | Freq: Every day | ORAL | Status: DC
  Administered 2011-11-02 – 2011-11-06 (×5): 200 mg
  Filled 2011-11-02 (×8): qty 20

## 2011-11-02 MED ORDER — OSMOLITE 1.2 CAL PO LIQD
1000.0000 mL | ORAL | Status: DC
  Administered 2011-11-02 – 2011-11-03 (×2): 1000 mL
  Filled 2011-11-02 (×5): qty 1000

## 2011-11-02 MED ORDER — AMIODARONE HCL IN DEXTROSE 360-4.14 MG/200ML-% IV SOLN
60.0000 mg/h | INTRAVENOUS | Status: DC
  Administered 2011-11-02: 60 mg/h via INTRAVENOUS
  Filled 2011-11-02 (×3): qty 200

## 2011-11-02 MED ORDER — DOPAMINE-DEXTROSE 3.2-5 MG/ML-% IV SOLN
INTRAVENOUS | Status: AC
  Administered 2011-11-02: 2.5 ug
  Filled 2011-11-02: qty 250

## 2011-11-02 MED ORDER — DEXTROSE 5 % IV SOLN
1.0000 g | INTRAVENOUS | Status: DC
  Filled 2011-11-02: qty 10

## 2011-11-02 MED ORDER — AMIODARONE HCL IN DEXTROSE 360-4.14 MG/200ML-% IV SOLN
30.0000 mg/h | INTRAVENOUS | Status: DC
  Administered 2011-11-02 – 2011-11-06 (×5): 30 mg/h via INTRAVENOUS
  Filled 2011-11-02 (×20): qty 200

## 2011-11-02 MED ORDER — AMIODARONE HCL IN DEXTROSE 360-4.14 MG/200ML-% IV SOLN
INTRAVENOUS | Status: AC
  Administered 2011-11-02: 200 mL
  Filled 2011-11-02: qty 200

## 2011-11-02 MED ORDER — AMIODARONE LOAD VIA INFUSION
150.0000 mg | Freq: Once | INTRAVENOUS | Status: AC
Start: 2011-11-02 — End: 2011-11-02
  Administered 2011-11-02: 150 mg via INTRAVENOUS
  Filled 2011-11-02: qty 83.34

## 2011-11-02 MED ORDER — DEXTROSE 5 % IV SOLN
1.0000 g | INTRAVENOUS | Status: DC
  Administered 2011-11-02 – 2011-11-03 (×2): 1 g via INTRAVENOUS
  Filled 2011-11-02 (×3): qty 10

## 2011-11-02 MED ORDER — DOPAMINE-DEXTROSE 3.2-5 MG/ML-% IV SOLN
5.0000 ug/kg/min | INTRAVENOUS | Status: DC
  Administered 2011-11-02: 2.5 ug/kg/min via INTRAVENOUS
  Filled 2011-11-02 (×2): qty 250

## 2011-11-02 NOTE — Progress Notes (Signed)
[facility] [place]  Subjective  - POD #2 s/p R CEA/ CABG  Intubated yesterday for respiratory distress   Physical Exam:  Intubated and sedated Incision ok       Assessment/Plan:  POD #2  Appears to have required re-intubation for pulmonary fatigue.  No evidence of neurologic deficits.  Likely due to history of smoking.  Hopefully will be able to be extubated soon   Albirda Shiel IV, V. WELLS 11/02/2011 8:22 AM --  Filed Vitals:   11/02/11 0700  BP: 95/47  Pulse: 92  Temp:   Resp: 16    Intake/Output Summary (Last 24 hours) at 11/02/11 1610 Last data filed at 11/02/11 0700  Gross per 24 hour  Intake 1684.67 ml  Output   1975 ml  Net -290.33 ml     Laboratory CBC    Component Value Date/Time   WBC 11.3* 11/02/2011 0455   HGB 9.8* 11/02/2011 0455   HCT 30.3* 11/02/2011 0455   PLT 76* 11/02/2011 0455    BMET    Component Value Date/Time   NA 140 11/02/2011 0455   K 5.3* 11/02/2011 0455   CL 105 11/02/2011 0455   CO2 28 11/02/2011 0455   GLUCOSE 121* 11/02/2011 0455   BUN 31* 11/02/2011 0455   CREATININE 2.48* 11/02/2011 0455   CREATININE 1.58* 09/18/2011 1224   CALCIUM 8.4 11/02/2011 0455   GFRNONAA 24* 11/02/2011 0455   GFRAA 28* 11/02/2011 0455    COAG Lab Results  Component Value Date   INR 1.57* 10/31/2011   INR 1.02 10/29/2011   No results found for this basename: PTT    Antibiotics Anti-infectives     Start     Dose/Rate Route Frequency Ordered Stop   10/31/11 2215   vancomycin (VANCOCIN) IVPB 1000 mg/200 mL premix        1,000 mg 200 mL/hr over 60 Minutes Intravenous  Once 10/31/11 1616 10/31/11 2300   10/31/11 2200   cefUROXime (ZINACEF) 1.5 g in dextrose 5 % 50 mL IVPB        1.5 g 100 mL/hr over 30 Minutes Intravenous Every 12 hours 10/31/11 1616 11/02/11 2159   10/31/11 0400   vancomycin (VANCOCIN) 1,500 mg in sodium chloride 0.9 % 250 mL IVPB        1,500 mg 125 mL/hr over 120 Minutes Intravenous To Surgery 10/30/11  1315 10/31/11 0815   10/31/11 0400   cefUROXime (ZINACEF) 1.5 g in dextrose 5 % 50 mL IVPB        1.5 g 100 mL/hr over 30 Minutes Intravenous To Surgery 10/30/11 1315 10/31/11 1445   10/31/11 0400   cefUROXime (ZINACEF) 750 mg in dextrose 5 % 50 mL IVPB  Status:  Discontinued        750 mg 100 mL/hr over 30 Minutes Intravenous To Surgery 10/30/11 1315 10/31/11 1542           V. Charlena Cross, M.D. [facility] [place] Office: [phone] Pager:  [phone]

## 2011-11-02 NOTE — Progress Notes (Signed)
PULMONARY/CCM PROGRESS NOTE  Requesting MD/Service: Tyrone Sage Date of admission: 6/21 Date of consult: 2022/11/17 Reason for consultation: post operative respiratory failure, s/p re-intubation 11-17-22  Pt Profile: 70 yowm recalcitrant smoker who underwent elective CABG and R CEA 6/21. Extubated per TCTS protocol post operatively but developed acute respiratory distress with severe hypoxemia and purulent respiratory secretions. Re-intubated November 17, 2022.  Lines, Tubes, etc: ETT 6/21 >> 17-Nov-2022 L IJ CVL 6/21 >> Radial A-line 6/21 >>  ETT 2022-11-17 >>    Microbiology: Resp 11-17-22 >>   Antibiotics:  Cefuroxime 6/21 >> November 17, 2022 Ceftriaxone 6/23 >>    Studies/Events: 6/21 CABG, R CEA 2022/11/17 reintubated for ineffective cough and airway secretions 6/23 AFRVR  Consults:  TCTS primary PCCM  Best Practice: DVT: LMWH SUP: PPI Nutrition: None Glycemic control: SSI  Sedation/analgesia: cont sedation protocol    Filed Vitals:   11/02/11 1530 11/02/11 1600 11/02/11 1630 11/02/11 1700  BP: 97/53 133/77 98/35 109/52  Pulse: 109 129 127 112  Temp:      TempSrc:      Resp: 14 14 14 14   Height:      Weight:      SpO2: 98% 100% 100% 100%    EXAM:  Gen: sedated, intubated HEENT: WNL Neck: JVP not visualized, s/p R CEA Lungs: diffuse rhonchi, diminished BS in R base Cardiovascular: RRR with extrasystoles, no M heard Abdomen: soft, NT, NABS Ext: No edema Neuro: MAEs, CNs intact  DATA:  BMET    Component Value Date/Time   NA 140 11/02/2011 0455   K 5.3* 11/02/2011 0455   CL 105 11/02/2011 0455   CO2 28 11/02/2011 0455   GLUCOSE 121* 11/02/2011 0455   BUN 31* 11/02/2011 0455   CREATININE 2.48* 11/02/2011 0455   CREATININE 1.58* 09/18/2011 1224   CALCIUM 8.4 11/02/2011 0455   GFRNONAA 24* 11/02/2011 0455   GFRAA 28* 11/02/2011 0455    CBC    Component Value Date/Time   WBC 11.3* 11/02/2011 0455   RBC 3.36* 11/02/2011 0455   HGB 9.8* 11/02/2011 0455   HCT 30.3* 11/02/2011 0455   PLT 76* 11/02/2011 0455   MCV 90.2 11/02/2011 0455   MCH 29.2 11/02/2011 0455   MCHC 32.3 11/02/2011 0455   RDW 16.0* 11/02/2011 0455    CXR: bibasilar atx/infiltrates   IMPRESSION:   Principal Problem:  *Respiratory failure, post-operative Active Problems:  COPD (chronic obstructive pulmonary disease)  Occlusion and stenosis of carotid artery without mention of cerebral infarction  Purulent bronchitis  S/P CABG (coronary artery bypass graft)  Atrial fibrillation with RVR  Acute renal failure   PLAN:  Cont current vent support Cont nebulized BDs Abx as above Amiodarone initiated per TCTS Post op mgmt Consider nutrition 6/24  Discussed with Dr Tyrone Sage  35 mins CCM time  [name], MD ; [facility] service Mobile [phone].  After 5:30 PM or weekends, call [phone]

## 2011-11-02 NOTE — Progress Notes (Signed)
Dr. Tyrone Sage by and updated on pt's decreased urinary output.  New orders received.

## 2011-11-02 NOTE — Consult Note (Signed)
  Amiodarone Drug - Drug Interaction Consult Note  Recommendations: No drug interactions have been identified that warrant a change in therapy. Patient is on simvastatin 10mg  daily which is an appropriate dose. Also on metoprolol but not having bradycardia.  Amiodarone is metabolized by the cytochrome P450 system and therefore has the potential to cause many drug interactions. Amiodarone has an average plasma half-life of 50 days (range 20 to 100 days).   There is potential for drug interactions to occur several weeks or months after stopping treatment and the onset of drug interactions may be slow after initiating amiodarone.   [x]  Statins: Increased risk of myopathy. Simvastatin- restrict dose to 20mg  daily. Other statins: counsel patients to report any muscle pain or weakness immediately.  []  Anticoagulants: Amiodarone can increase anticoagulant effect. Consider warfarin dose reduction. Patients should be monitored closely and the dose of anticoagulant altered accordingly, remembering that amiodarone levels take several weeks to stabilize.  []  Antiepileptics: Amiodarone can increase plasma concentration of phenytoin, phenytoin dose should be reduced. Note that small changes in phenytoin dose can result in large changes in phenytoin levels. Monitor patient closely and counsel on signs of toxicity.  [x]  Beta blockers: increased risk of bradycardia, AV block and myocardial depression. Sotalol - avoid concomitant use.  []   Calcium channel blockers (diltiazem and verapamil): increased risk of bradycardia, AV block and myocardial depression.  []   Cyclosporine: Amiodarone increases levels of cyclosporine. Reduced dose of cyclosporine is recommended.  []  Digoxin dose should be halved when amiodarone is started.  []  Diuretics: increased risk of cardiotoxicity if hypokalemia occurs.  []  Oral hypoglycemic agents (glyburide, glipizide, glimepiride): increased risk of hypoglycemia. Patient's glucose  levels should be monitored closely when initiating amiodarone therapy.   []  Drugs that prolong the QT interval: Concurrent therapy is contraindicated due to the increased risk of torsades de pointes; . Antibiotics: e.g. fluoroquinolones, erythromycin. . Antiarrhythmics: e.g. quinidine, procainamide, disopyramide, sotalol. . Antipsychotics: e.g. phenothiazines, haloperidol.  . Lithium, tricyclic antidepressants, and methadone. Thank You,  Fredrik Rigger  11/02/2011 10:28 AM

## 2011-11-02 NOTE — Progress Notes (Signed)
Patient ID: Nathan [name], male   DOB: 03-Apr-1941, 71 y.o.   MRN: [id number] Now in rapid afib, start iv amiodarone  Delight Ovens MD  Beeper [phone] Office [phone] 11/02/2011 9:31 AM

## 2011-11-02 NOTE — Progress Notes (Signed)
MD called reguarding urinary output of 60cc for last 3 hours.  New orders received.

## 2011-11-02 NOTE — Progress Notes (Signed)
Patient ID: Nathan [name], male   DOB: Nov 06, 1940, 71 y.o.   MRN: [id number] TCTS DAILY PROGRESS NOTE                   [address]            [place] [address]          [phone]      2 Days Post-Op Procedure(s) (LRB): CORONARY ARTERY BYPASS GRAFTING (CABG) (N/A) ENDARTERECTOMY CAROTID (Right)  Total Length of Stay:  LOS: 2 days   Subjective: Sedated on vent  Objective: Vital signs in last 24 hours: Temp:  [97.3 F (36.3 C)-99.3 F (37.4 C)] 99.3 F (37.4 C) (06/23 0400) Pulse Rate:  [79-104] 92  (06/23 0700) Cardiac Rhythm:  [-] Normal sinus rhythm (06/23 0700) Resp:  [6-34] 16  (06/23 0700) BP: (95-169)/(39-83) 95/47 mmHg (06/23 0700) SpO2:  [67 %-100 %] 100 % (06/23 0700) Arterial Line BP: (130-159)/(55-62) 130/55 mmHg (06/22 1100) FiO2 (%):  [50 %-100 %] 60 % (06/23 0700) Weight:  [215 lb 9.8 oz (97.8 kg)] 215 lb 9.8 oz (97.8 kg) (06/23 0600)  Filed Weights   10/31/11 0627 11/01/11 0700 11/02/11 0600  Weight: 201 lb (91.173 kg) 212 lb 8.4 oz (96.4 kg) 215 lb 9.8 oz (97.8 kg)    Weight change: 3 lb 1.4 oz (1.4 kg)   Hemodynamic parameters for last 24 hours: PAP: (24-27)/(12) 24/12 mmHg  Intake/Output from previous day: 06/22 0701 - 06/23 0700 In: 1744.7 [I.V.:1644.7; IV Piggyback:100] Out: 2075 [Urine:1975; Drains:50; Chest Tube:50]  Intake/Output this shift:    Current Meds: Scheduled Meds:   . acetaminophen  1,000 mg Oral Q6H   Or  . acetaminophen (TYLENOL) oral liquid 160 mg/5 mL  975 mg Per Tube Q6H  . acetylcysteine  4 mL Nebulization Q6H  . allopurinol  300 mg Oral Daily  . aspirin EC  325 mg Oral Daily   Or  . aspirin  324 mg Per Tube Daily  . bisacodyl  10 mg Oral Daily   Or  . bisacodyl  10 mg Rectal Daily  . budesonide  0.25 mg Nebulization Q6H  . buPROPion  150 mg Oral BID  . cefUROXime (ZINACEF)  IV  1.5 g Intravenous Q12H  . colchicine  0.6 mg Oral Daily  . docusate sodium  200 mg Oral Daily  . enoxaparin  (LOVENOX) injection  30 mg Subcutaneous Q24H  . furosemide  40 mg Intravenous Once  . insulin aspart  0-24 Units Subcutaneous Q4H  . insulin regular  0-10 Units Intravenous TID WC  . levalbuterol  0.63 mg Nebulization Q6H  . metoprolol tartrate  12.5 mg Oral BID   Or  . metoprolol tartrate  12.5 mg Per Tube BID  . pantoprazole  40 mg Oral Q1200  . simvastatin  10 mg Oral QHS  . sodium chloride  3 mL Intravenous Q12H                                 Continuous Infusions:   . sodium chloride 20 mL/hr at 11/01/11 0700  . sodium chloride Stopped (10/31/11 1630)  . sodium chloride    . dexmedetomidine (PRECEDEX) IV infusion Stopped (10/31/11 1822)  . dextrose 5 % and 0.45% NaCl 75 mL/hr at 11/02/11 0700  . DOPamine Stopped (10/31/11 2000)  . fentaNYL infusion INTRAVENOUS 50 mcg/hr (11/02/11 0700)  . insulin (NOVOLIN-R) infusion Stopped (  10/31/11 2005)  . lactated ringers 20 mL/hr at 11/01/11 0700  . midazolam (VERSED) infusion 1 mg/hr (11/02/11 0700)  . nitroGLYCERIN Stopped (11/01/11 1100)  . phenylephrine (NEO-SYNEPHRINE) Adult infusion Stopped (10/31/11 1730)   PRN Meds:.albumin human, fentaNYL, metoprolol, midazolam, midazolam, morphine injection, ondansetron (ZOFRAN) IV, oxyCODONE, sodium chloride  General appearance: no distress and sedated, follows limited commands  Neurologic: poss. left sided weakness but sedated and exam inconsistent Heart: regular rate and rhythm, S1, S2 normal, no murmur, click, rub or gallop and normal apical impulse Lungs: diminished breath sounds bilaterally Abdomen: soft, non-tender; bowel sounds normal; no masses,  no organomegaly Extremities: extremities normal, atraumatic, no cyanosis or edema and Homans sign is negative, no sign of DVT Wound: sternum intact  Lab Results: CBC: Basename 11/02/11 0455 11/01/11 1636 11/01/11 1630  WBC 11.3* -- 19.3*  HGB 9.8* 11.2* --  HCT 30.3* 33.0* --  PLT 76* -- 105*   BMET:  Basename 11/02/11 0455  11/01/11 1636 11/01/11 0315  NA 140 141 --  K 5.3* 4.9 --  CL 105 104 --  CO2 28 -- 22  GLUCOSE 121* 113* --  BUN 31* 23 --  CREATININE 2.48* 1.70* --  CALCIUM 8.4 -- 8.1*    PT/INR:  Basename 10/31/11 1630  LABPROT 19.1*  INR 1.57*   Radiology: Dg Chest Port 1 View  11/02/2011  *RADIOLOGY REPORT*  Clinical Data: Postop  PORTABLE CHEST - 1 VIEW  Comparison: 11/01/2011  Findings: Endotracheal tube and left internal jugular vein introducer are stable.  NG tube placed beyond the gastroesophageal junction.  Bilateral pleural effusions and bibasilar pulmonary opacities are stable.  No pneumothorax.  IMPRESSION: NG tube placed.  Otherwise stable.  Bibasilar pulmonary opacities and pleural effusions.  Original Report Authenticated By: Donavan Burnet, M.D.   Dg Chest Port 1 View  11/01/2011  *RADIOLOGY REPORT*  Clinical Data: ET tube placement.  PORTABLE CHEST - 1 VIEW  Comparison: 11/01/2011  Findings: Endotracheal tube is 4 cm above the carina.  Prior CABG. Swan-Ganz catheter has been removed.  The patchy bibasilar and left perihilar airspace opacities, similar to prior study.  Small effusions.  Mild cardiomegaly.  IMPRESSION: Endotracheal tube 4 cm above the carina.  Interval removal of Swan- Ganz catheter.  Otherwise no significant change.  Original Report Authenticated By: Cyndie Chime, M.D.   Dg Chest Portable 1 View In Am  11/01/2011  *RADIOLOGY REPORT*  Clinical Data: History of CABG.  PORTABLE CHEST - 1 VIEW  Comparison: 10/31/2011  Findings: The endotracheal tube has been removed. Evidence for midline chest drains which are in stable position.  No evidence for pneumothorax.  Swan-Ganz catheter in the distal right pulmonary artery and stable.  There are increased bibasilar densities most likely representing atelectasis and small effusions.  Heart size is within normal limits.  Postsurgical changes in lower cervical spine.  IMPRESSION: Removal of endotracheal tube.  Increased basilar  densities suggest atelectasis and small effusions.  Negative for pneumothorax.  Original Report Authenticated By: Richarda Overlie, M.D.   Dg Chest Portable 1 View  10/31/2011  *RADIOLOGY REPORT*  Clinical Data: Postop CABG and right carotid endarterectomy  PORTABLE CHEST - 1 VIEW  Comparison: Chest x-ray of 10/29/2011  Findings: The tip of the endotracheal tube is approximately 6.4 cm above the carina.  A Swan-Ganz catheter is present with the tip in the right lower lobe pulmonary artery.  No pneumothorax is seen. There is basilar atelectasis present particularly at the right lung base.  Cardiomegaly is stable.  NG tube is noted with the tip at the GE junction.  IMPRESSION:  1.  Endotracheal tube 6.4 cm above the carina. 2.  Swan-Ganz catheter tip in right lower lobe pulmonary artery. 3.  Basilar atelectasis, right greater than left.  Original Report Authenticated By: Juline Patch, M.D.     Assessment/Plan: S/P Procedure(s) (LRB): CORONARY ARTERY BYPASS GRAFTING (CABG) (N/A) ENDARTERECTOMY CAROTID (Right) Diuresis Continue ABX therapy due to poss Post-op infection culture sputum pending Continue foley due to strict I&O, patient critically ill, patient in ICU and urinary output monitoring check sputum cultures      Delight Ovens MD  Beeper [phone] Office [phone] 11/02/2011 8:48 AM

## 2011-11-02 NOTE — Progress Notes (Signed)
Patient ID: Nathan [name], male   DOB: [birth year], 71 y.o.   MRN: [id number]                   [address]            [place] [address]          [phone]     2 Days Post-Op Procedure(s) (LRB): CORONARY ARTERY BYPASS GRAFTING (CABG) (N/A) ENDARTERECTOMY CAROTID (Right)  Total Length of Stay:  LOS: 2 days  BP 104/46  Pulse 112  Temp 98.9 F (37.2 C) (Oral)  Resp 14  Ht 6' 0.84" (1.85 m)  Wt 215 lb 9.8 oz (97.8 kg)  BMI 28.58 kg/m2  SpO2 99%     . sodium chloride 20 mL/hr at 11/01/11 0700  . amiodarone (NEXTERONE PREMIX) 360 mg/200 mL dextrose 30 mg/hr (11/02/11 1615)  . DOPamine 2.5 mcg/kg/min (11/02/11 1305)  . feeding supplement (OSMOLITE 1.2 CAL) 1,000 mL (11/02/11 1556)  . fentaNYL infusion INTRAVENOUS 100 mcg/hr (11/02/11 1900)  . insulin (NOVOLIN-R) infusion Stopped (10/31/11 2005)  . lactated ringers 20 mL/hr at 11/01/11 0700  . midazolam (VERSED) infusion 1 mg/hr (11/02/11 1000)  . nitroGLYCERIN Stopped (11/01/11 1100)  . phenylephrine (NEO-SYNEPHRINE) Adult infusion Stopped (10/31/11 1730)                             Lab Results  Component Value Date   WBC 10.4 11/02/2011   HGB 9.2* 11/02/2011   HCT 28.0* 11/02/2011   PLT 77* 11/02/2011   GLUCOSE 120* 11/02/2011   ALT 8 11/02/2011   AST 16 11/02/2011   NA 135 11/02/2011   K 4.8 11/02/2011   CL 102 11/02/2011   CREATININE 2.98* 11/02/2011   BUN 40* 11/02/2011   CO2 25 11/02/2011   INR 1.57* 10/31/2011   HGBA1C 5.3 10/29/2011   k level adequate, cr up to 2.98 UOP increased on Dopamine Still afib controlled  Delight Ovens MD  Beeper [phone] Office [phone] 11/02/2011 7:41 PM

## 2011-11-03 ENCOUNTER — Inpatient Hospital Stay (HOSPITAL_COMMUNITY): Payer: Medicare PPO

## 2011-11-03 DIAGNOSIS — N179 Acute kidney failure, unspecified: Secondary | ICD-10-CM

## 2011-11-03 LAB — CBC
HCT: 30.3 % — ABNORMAL LOW (ref 39.0–52.0)
HCT: 26.7 % — ABNORMAL LOW (ref 39.0–52.0)
Hemoglobin: 9.8 g/dL — ABNORMAL LOW (ref 13.0–17.0)
Hemoglobin: 8.7 g/dL — ABNORMAL LOW (ref 13.0–17.0)
MCH: 29.2 pg (ref 26.0–34.0)
MCH: 29.4 pg (ref 26.0–34.0)
MCHC: 32.3 g/dL (ref 30.0–36.0)
MCHC: 32.6 g/dL (ref 30.0–36.0)
MCV: 90.2 fL (ref 78.0–100.0)
MCV: 90.2 fL (ref 78.0–100.0)
Platelets: 76 10*3/uL — ABNORMAL LOW (ref 150–400)
Platelets: 85 10*3/uL — ABNORMAL LOW (ref 150–400)
RBC: 3.36 MIL/uL — ABNORMAL LOW (ref 4.22–5.81)
RBC: 2.96 MIL/uL — ABNORMAL LOW (ref 4.22–5.81)
RDW: 16 % — ABNORMAL HIGH (ref 11.5–15.5)
RDW: 16.2 % — ABNORMAL HIGH (ref 11.5–15.5)
WBC: 11.3 10*3/uL — ABNORMAL HIGH (ref 4.0–10.5)
WBC: 9.6 10*3/uL (ref 4.0–10.5)

## 2011-11-03 LAB — COMPREHENSIVE METABOLIC PANEL
ALT: 10 U/L (ref 0–53)
AST: 20 U/L (ref 0–37)
Albumin: 2.2 g/dL — ABNORMAL LOW (ref 3.5–5.2)
Alkaline Phosphatase: 65 U/L (ref 39–117)
BUN: 48 mg/dL — ABNORMAL HIGH (ref 6–23)
CO2: 26 mEq/L (ref 19–32)
Calcium: 8.3 mg/dL — ABNORMAL LOW (ref 8.4–10.5)
Chloride: 100 mEq/L (ref 96–112)
Creatinine, Ser: 3.08 mg/dL — ABNORMAL HIGH (ref 0.50–1.35)
GFR calc Af Amer: 22 mL/min — ABNORMAL LOW (ref >90)
GFR calc non Af Amer: 19 mL/min — ABNORMAL LOW (ref >90)
Glucose, Bld: 151 mg/dL — ABNORMAL HIGH (ref 70–99)
Potassium: 4.8 mEq/L (ref 3.5–5.1)
Sodium: 135 mEq/L (ref 135–145)
Total Bilirubin: 0.5 mg/dL (ref 0.3–1.2)
Total Protein: 5 g/dL — ABNORMAL LOW (ref 6.0–8.3)

## 2011-11-03 LAB — GLUCOSE, CAPILLARY
Glucose-Capillary: 121 mg/dL — ABNORMAL HIGH (ref 70–99)
Glucose-Capillary: 124 mg/dL — ABNORMAL HIGH (ref 70–99)
Glucose-Capillary: 102 mg/dL — ABNORMAL HIGH (ref 70–99)
Glucose-Capillary: 166 mg/dL — ABNORMAL HIGH (ref 70–99)
Glucose-Capillary: 103 mg/dL — ABNORMAL HIGH (ref 70–99)

## 2011-11-03 LAB — PROTIME-INR
INR: 1.38 (ref 0.00–1.49)
Prothrombin Time: 17.2 seconds — ABNORMAL HIGH (ref 11.6–15.2)

## 2011-11-03 LAB — MAGNESIUM: Magnesium: 2.5 mg/dL (ref 1.5–2.5)

## 2011-11-03 MED ORDER — CHLORHEXIDINE GLUCONATE 0.12 % MT SOLN
15.0000 mL | Freq: Two times a day (BID) | OROMUCOSAL | Status: DC
  Administered 2011-11-03 – 2011-11-15 (×20): 15 mL via OROMUCOSAL
  Filled 2011-11-03 (×26): qty 15

## 2011-11-03 MED ORDER — FENTANYL CITRATE 0.05 MG/ML IJ SOLN
25.0000 ug | INTRAMUSCULAR | Status: DC | PRN
  Administered 2011-11-04 – 2011-11-06 (×13): 50 ug via INTRAVENOUS
  Filled 2011-11-03 (×10): qty 2

## 2011-11-03 MED ORDER — PANTOPRAZOLE SODIUM 40 MG IV SOLR
40.0000 mg | Freq: Every day | INTRAVENOUS | Status: DC
  Administered 2011-11-03 – 2011-11-09 (×6): 40 mg via INTRAVENOUS
  Filled 2011-11-03 (×9): qty 40

## 2011-11-03 MED ORDER — SODIUM CHLORIDE 0.9 % IV BOLUS (SEPSIS)
500.0000 mL | Freq: Once | INTRAVENOUS | Status: AC
  Administered 2011-11-03: 500 mL via INTRAVENOUS

## 2011-11-03 MED ORDER — BIOTENE DRY MOUTH MT LIQD
15.0000 mL | Freq: Four times a day (QID) | OROMUCOSAL | Status: DC
  Administered 2011-11-03 – 2011-11-16 (×41): 15 mL via OROMUCOSAL

## 2011-11-03 MED ORDER — MIDAZOLAM HCL 2 MG/2ML IJ SOLN
1.0000 mg | INTRAMUSCULAR | Status: DC | PRN
  Administered 2011-11-04 (×2): 1 mg via INTRAVENOUS
  Administered 2011-11-04: 2 mg via INTRAVENOUS
  Administered 2011-11-04 (×2): 1 mg via INTRAVENOUS
  Administered 2011-11-04 – 2011-11-06 (×6): 2 mg via INTRAVENOUS
  Filled 2011-11-03 (×9): qty 2

## 2011-11-03 MED FILL — Potassium Chloride Inj 2 mEq/ML: INTRAVENOUS | Qty: 40 | Status: AC

## 2011-11-03 MED FILL — Magnesium Sulfate Inj 50%: INTRAMUSCULAR | Qty: 10 | Status: AC

## 2011-11-03 NOTE — Progress Notes (Signed)
Patient ID: Nathan [name], male   DOB: 1941/04/08, 71 y.o.   MRN: [id number] TCTS DAILY PROGRESS NOTE                   [address]            [place] [address]          [id number]      3 Days Post-Op Procedure(s) (LRB): CORONARY ARTERY BYPASS GRAFTING (CABG) (N/A) ENDARTERECTOMY CAROTID (Right)  Total Length of Stay:  LOS: 3 days   Subjective: Sedated on vent  Objective: Vital signs in last 24 hours: Temp:  [98 F (36.7 C)-99.3 F (37.4 C)] 98 F (36.7 C) (06/24 0400) Pulse Rate:  [65-129] 75  (06/24 0700) Cardiac Rhythm:  [-] Normal sinus rhythm (06/24 0700) Resp:  [12-20] 14  (06/24 0700) BP: (75-139)/(35-84) 112/44 mmHg (06/24 0700) SpO2:  [98 %-100 %] 99 % (06/24 0700) FiO2 (%):  [40 %-100 %] 40 % (06/24 0700) Weight:  [213 lb 13.5 oz (97 kg)] 213 lb 13.5 oz (97 kg) (06/24 0600)  Filed Weights   11/01/11 0700 11/02/11 0600 11/03/11 0600  Weight: 212 lb 8.4 oz (96.4 kg) 215 lb 9.8 oz (97.8 kg) 213 lb 13.5 oz (97 kg)    Weight change: -1 lb 12.2 oz (-0.8 kg)   Hemodynamic parameters for last 24 hours:    Intake/Output from previous day: 06/23 0701 - 06/24 0700 In: 2364.1 [I.V.:1694.1; NG/GT:620; IV Piggyback:50] Out: 845 [Urine:645; Emesis/NG output:200]  Intake/Output this shift:    Current Meds: Scheduled Meds:   . acetaminophen  1,000 mg Oral Q6H   Or  . acetaminophen (TYLENOL) oral liquid 160 mg/5 mL  975 mg Per Tube Q6H  . acetylcysteine  4 mL Nebulization Q6H  . allopurinol  300 mg Oral Daily  . amiodarone (NEXTERONE PREMIX) 360 mg/200 mL dextrose      . amiodarone (NEXTERONE PREMIX) 360 mg/200 mL dextrose      . amiodarone  150 mg Intravenous Once  . amiodarone  150 mg Intravenous Once  . amiodarone  150 mg Intravenous Once  . aspirin EC  325 mg Oral Daily   Or  . aspirin  324 mg Per Tube Daily  . bisacodyl  10 mg Oral Daily   Or  . bisacodyl  10 mg Rectal Daily  . budesonide  0.25 mg Nebulization Q6H  . buPROPion   150 mg Oral BID  . cefTRIAXone (ROCEPHIN)  IV  1 g Intravenous Q24H  . cefUROXime (ZINACEF)  IV  1.5 g Intravenous Q12H  . colchicine  0.6 mg Oral Daily  . docusate  200 mg Per Tube Daily  . DOPamine      . enoxaparin (LOVENOX) injection  30 mg Subcutaneous Q24H  . furosemide  40 mg Intravenous Once  . insulin aspart  0-24 Units Subcutaneous Q4H  . insulin regular  0-10 Units Intravenous TID WC  . levalbuterol  0.63 mg Nebulization Q6H  . metoprolol tartrate  12.5 mg Oral BID   Or  . metoprolol tartrate  12.5 mg Per Tube BID  . pantoprazole  40 mg Oral Q1200  . simvastatin  10 mg Oral QHS  . sodium chloride  3 mL Intravenous Q12H      Q24H      Daily   Continuous Infusions:   . sodium chloride 10 mL/hr at 11/03/11 0700  . amiodarone (NEXTERONE PREMIX) 360 mg/200 mL dextrose 30 mg/hr (11/03/11 0700)  .  DOPamine 2.5 mcg/kg/min (11/03/11 0700)  . feeding supplement (OSMOLITE 1.2 CAL) 1,000 mL (11/02/11 1556)  . fentaNYL infusion INTRAVENOUS 50 mcg/hr (11/03/11 0700)  . insulin (NOVOLIN-R) infusion Stopped (10/31/11 2005)  . lactated ringers 20 mL/hr at 11/01/11 0700  . midazolam (VERSED) infusion 1 mg/hr (11/03/11 0700)  . nitroGLYCERIN Stopped (11/01/11 1100)  . phenylephrine (NEO-SYNEPHRINE) Adult infusion Stopped (10/31/11 1730)  . DISCONTD: sodium chloride Stopped (10/31/11 1630)  . DISCONTD: sodium chloride    . DISCONTD: amiodarone (NEXTERONE PREMIX) 360 mg/200 mL dextrose 60 mg/hr (11/02/11 1544)  . DISCONTD: dexmedetomidine (PRECEDEX) IV infusion Stopped (10/31/11 1822)  . DISCONTD: dextrose 5 % and 0.45% NaCl 75 mL/hr at 11/02/11 0700  . DISCONTD: DOPamine Stopped (10/31/11 2000)   PRN Meds:.fentaNYL, metoprolol, midazolam, midazolam, morphine injection, ondansetron (ZOFRAN) IV, oxyCODONE, sodium chloride  General appearance: uncooperative and sedated Neurologic: left sided weakness Heart: regular rate and rhythm, S1, S2 normal, no murmur, click, rub or gallop Lungs:  diminished breath sounds bibasilar Abdomen: soft, non-tender; bowel sounds normal; no masses,  no organomegaly Extremities: extremities normal, atraumatic, no cyanosis or edema and Homans sign is negative, no sign of DVT Wound: sternum stable  Lab Results: CBC: Basename 11/03/11 0310 11/02/11 1730  WBC 9.6 10.4  HGB 8.7* 9.2*  HCT 26.7* 28.0*  PLT 85* 77*   BMET:  Basename 11/03/11 0310 11/02/11 1730  NA 135 135  K 4.8 4.8  CL 100 102  CO2 26 25  GLUCOSE 151* 120*  BUN 48* 40*  CREATININE 3.08* 2.98*  CALCIUM 8.3* 8.1*    PT/INR:  Basename 11/03/11 0310  LABPROT 17.2*  INR 1.38   Radiology: Dg Chest Port 1 View  11/03/2011  *RADIOLOGY REPORT*  Clinical Data: Postop CABG.  PORTABLE CHEST - 1 VIEW  Comparison: 11/02/2011  Findings: Changes of CABG.  Endotracheal tube is 6 cm above the carina.  NG tube has been removed.  Patchy bilateral airspace opacities are stable.  Cardiomegaly.  Suspect layering right effusion.  IMPRESSION: No significant change.  Original Report Authenticated By: Cyndie Chime, M.D.   Dg Chest Port 1 View  11/02/2011  *RADIOLOGY REPORT*  Clinical Data: Postop  PORTABLE CHEST - 1 VIEW  Comparison: 11/01/2011  Findings: Endotracheal tube and left internal jugular vein introducer are stable.  NG tube placed beyond the gastroesophageal junction.  Bilateral pleural effusions and bibasilar pulmonary opacities are stable.  No pneumothorax.  IMPRESSION: NG tube placed.  Otherwise stable.  Bibasilar pulmonary opacities and pleural effusions.  Original Report Authenticated By: Donavan Burnet, M.D.   Dg Chest Port 1 View  11/01/2011  *RADIOLOGY REPORT*  Clinical Data: ET tube placement.  PORTABLE CHEST - 1 VIEW  Comparison: 11/01/2011  Findings: Endotracheal tube is 4 cm above the carina.  Prior CABG. Swan-Ganz catheter has been removed.  The patchy bibasilar and left perihilar airspace opacities, similar to prior study.  Small effusions.  Mild cardiomegaly.  IMPRESSION:  Endotracheal tube 4 cm above the carina.  Interval removal of Swan- Ganz catheter.  Otherwise no significant change.  Original Report Authenticated By: Cyndie Chime, M.D.     Assessment/Plan: S/P Procedure(s) (LRB): CORONARY ARTERY BYPASS GRAFTING (CABG) (N/A) ENDARTERECTOMY CAROTID (Right) acute renal and respiratory failure Sedated difficult to access mental status, but appears to move rt side more then left, but none to command Rising cr has plateaued Back in sinus rhythm Attempt to wean vent today   Delight Ovens MD  Beeper [phone] Office [phone] 11/03/2011  7:47 AM

## 2011-11-03 NOTE — Progress Notes (Signed)
At this time patient has converted from A-fib back to NSR. SBP in the 90's, 99% on vent, HR in the 60-70's. Will continue to monitor the patient.

## 2011-11-03 NOTE — Progress Notes (Signed)
TCTS BRIEF SICU PROGRESS NOTE  3 Days Post-Op  S/P Procedure(s) (LRB): CORONARY ARTERY BYPASS GRAFTING (CABG) (N/A) ENDARTERECTOMY CAROTID (Right)   Sedated but arousable on vent NSR BP stable UOP adequate  Plan: Continue current plan  Nathan [name] H 11/03/2011 8:59 PM

## 2011-11-03 NOTE — Progress Notes (Signed)
INITIAL ADULT NUTRITION ASSESSMENT Date: 11/03/2011   Time: 1:53 PM  Reason for Assessment: VDRF, New Tube Feeding  ASSESSMENT: Male 71 y.o.  Dx: Respiratory failure, post-operative  Hx:  Past Medical History  Diagnosis Date  . Aorto-iliac disease     bilat with stent 2007  . CAD (coronary artery disease)     with loop to the circumflex 2000, inferolateral infarction  . Hypertension   . Paroxysmal atrial flutter     ablation  . ED (erectile dysfunction)   . COPD (chronic obstructive pulmonary disease)   . Hyperlipidemia   . Stroke   . Shortness of breath   . GERD (gastroesophageal reflux disease)   . Gout     Related Meds:     . acetaminophen  1,000 mg Oral Q6H   Or  . acetaminophen (TYLENOL) oral liquid 160 mg/5 mL  975 mg Per Tube Q6H  . acetylcysteine  4 mL Nebulization Q6H  . allopurinol  300 mg Oral Daily  . amiodarone  150 mg Intravenous Once  . antiseptic oral rinse  15 mL Mouth Rinse QID  . aspirin EC  325 mg Oral Daily   Or  . aspirin  324 mg Per Tube Daily  . bisacodyl  10 mg Oral Daily   Or  . bisacodyl  10 mg Rectal Daily  . budesonide  0.25 mg Nebulization Q6H  . buPROPion  150 mg Oral BID  . cefTRIAXone (ROCEPHIN)  IV  1 g Intravenous Q24H  . chlorhexidine  15 mL Mouth Rinse BID  . colchicine  0.6 mg Oral Daily  . docusate  200 mg Per Tube Daily  . enoxaparin (LOVENOX) injection  30 mg Subcutaneous Q24H  . insulin aspart  0-24 Units Subcutaneous Q4H  . insulin regular  0-10 Units Intravenous TID WC  . levalbuterol  0.63 mg Nebulization Q6H  . metoprolol tartrate  12.5 mg Oral BID   Or  . metoprolol tartrate  12.5 mg Per Tube BID  . pantoprazole  40 mg Oral Q1200  . simvastatin  10 mg Oral QHS  . sodium chloride  500 mL Intravenous Once  . sodium chloride  3 mL Intravenous Q12H  . DISCONTD: cefTRIAXone (ROCEPHIN)  IV  1 g Intravenous Q24H    Ht: 6' 0.83" (185 cm)  Wt: 213 lb 13.5 oz (97 kg) (bed weight scale, one pillow)  Ideal Wt:  83.6 kg % Ideal Wt: 116%  Usual Wt: 204 lb % Usual Wt: 104%  Body mass index is 28.34 kg/(m^2).  Food/Nutrition Related Hx: no triggers per admission nutrition screen  Labs:  CMP     Component Value Date/Time   NA 135 11/03/2011 0310   K 4.8 11/03/2011 0310   CL 100 11/03/2011 0310   CO2 26 11/03/2011 0310   GLUCOSE 151* 11/03/2011 0310   BUN 48* 11/03/2011 0310   CREATININE 3.08* 11/03/2011 0310   CREATININE 1.58* 09/18/2011 1224   CALCIUM 8.3* 11/03/2011 0310   PROT 5.0* 11/03/2011 0310   ALBUMIN 2.2* 11/03/2011 0310   AST 20 11/03/2011 0310   ALT 10 11/03/2011 0310   ALKPHOS 65 11/03/2011 0310   BILITOT 0.5 11/03/2011 0310   GFRNONAA 19* 11/03/2011 0310   GFRAA 22* 11/03/2011 0310     Intake/Output Summary (Last 24 hours) at 11/03/11 1355 Last data filed at 11/03/11 1200  Gross per 24 hour  Intake 1766.62 ml  Output    990 ml  Net 776.62 ml    CBG (  last 3)   Basename 11/03/11 1147 11/03/11 0754 11/03/11 0407  GLUCAP 102* 124* 121*    Diet Order: NPO  Supplements/Tube Feeding: N/A  IVF:    sodium chloride Last Rate: 10 mL/hr at 11/03/11 0700  amiodarone (NEXTERONE PREMIX) 360 mg/200 mL dextrose Last Rate: 30 mg/hr (11/03/11 0700)  DOPamine Last Rate: 5 mcg/kg/min (11/03/11 1050)  feeding supplement (OSMOLITE 1.2 CAL) Last Rate: 1,000 mL (11/02/11 1556)  insulin (NOVOLIN-R) infusion Last Rate: Stopped (10/31/11 2005)  lactated ringers Last Rate: 20 mL/hr at 11/01/11 0700  nitroGLYCERIN Last Rate: Stopped (11/01/11 1100)  phenylephrine (NEO-SYNEPHRINE) Adult infusion Last Rate: Stopped (10/31/11 1730)  DISCONTD: amiodarone (NEXTERONE PREMIX) 360 mg/200 mL dextrose Last Rate: 60 mg/hr (11/02/11 1544)  DISCONTD: fentaNYL infusion INTRAVENOUS Last Rate: Stopped (11/03/11 0800)  DISCONTD: midazolam (VERSED) infusion Last Rate: Stopped (11/03/11 0800)    Estimated Nutritional Needs:   Kcal: 1900-2000 Protein: 100-110 gm Fluid: 1.9-2.0 L  RD unable to obtain nutrition  hx from patient; s/p CABG, endarterectomy, EVH-R leg; extubated post-op 6/22 at 0346 AM, then re-intubated 6/22 1027 PM; Osmolite 1.2 initiated 6/23; currently infusing at 60 ml/hr via OGT providing 1728 total kcals, 80 gm protein, 1181 ml of free water; to being PS trials today but no extubation due to mental status & increased secretions per CCM  NUTRITION DIAGNOSIS: -Inadequate oral intake (NI-2.1).  Status: Ongoing  RELATED TO: inability to eat  AS EVIDENCE BY: NPO status  MONITORING/EVALUATION(Goals): Goal: EN to meet >90% of estimated nutrition needs Monitor: EN regimen, respiratory status, weight, labs, I/O's  EDUCATION NEEDS: -No education needs identified at this time  INTERVENTION:  Recommend Osmolite 1.2 formula at goal rate of 65 ml/hr with Prostat liquid protein 30 ml daily via tube to better meet estimated nutrition needs -- will provide 1972 total kcals, 102 gm protein, 1279 ml of free water  RD to follow for nutrition care plan  Dietitian #: [phone]  DOCUMENTATION CODES Per approved criteria  -Not Applicable    Alger Memos 11/03/2011, 1:53 PM

## 2011-11-03 NOTE — Op Note (Signed)
Nathan [name], MUCKLEROY NO.:  [id number]  MEDICAL RECORD NO.:  [id number]  LOCATION:  2313                         FACILITY:  [facility]  PHYSICIAN:  Sheliah Plane, MD    DATE OF BIRTH:  13-Mar-1941  DATE OF PROCEDURE:  10/31/2011 DATE OF DISCHARGE:                              OPERATIVE REPORT   PREOPERATIVE DIAGNOSIS:  Coronary occlusive disease.  POSTOPERATIVE DIAGNOSIS:  Coronary occlusive disease.  SURGICAL PROCEDURE:  Coronary artery bypass grafting x4 with left internal mammary to the left anterior descending coronary artery, reverse saphenous vein graft to the diagonal coronary artery, reverse saphenous vein graft to the first obtuse marginal coronary artery, reverse saphenous vein graft to the posterior descending coronary artery with right leg, calf and thigh endo-vein harvesting done simultaneously with right carotid endarterectomy with Dr. Myra Gianotti.  SURGEON:  Sheliah Plane, MD  FIRST ASSISTANT:  Rowe Clack, PA-C  BRIEF HISTORY:  The patient is a 71 year old male with known peripheral vascular disease, who presented  to Dr. Verdis Prime because of peripheral vascular disease and claudication primarily in his left leg. Had a positive stress test, and before further evaluation for his peripheral vascular disease underwent cardiac catheterization revealing significant 3-vessel disease with 70% LAD, 80% diagonal, 80% circumflex with total occlusion of the circumflex after the takeoff of the first OM which were very small vessels, and long tubular 80-85% stenosis of the right coronary artery, with preserved LV function.  Prior to surgery, the patient was a known long-term smoker.  Pulmonary function studies were performed.  He also has a known right renal artery stenosis. Because of subtle mental status changes since October of the previous year, further evaluation including CT scan of the head and carotid duplex studies revealed evidence of old stroke  and high-grade right carotid disease.  The patient was seen by Dr. Myra Gianotti for this and it was felt that it was severe enough to proceed with concomitant coronary artery bypass grafting.  The patient was counseled on multiple occasions about the need to stop smoking, however, he refused to do this.  The risks and options and the increased risk of this procedure because of his previous history of stroke, cerebrovascular disease, and smoking history were all aware of the patient and his wife.  The patient signed informed consent.  DESCRIPTION OF PROCEDURE:  With Swan-Ganz and arterial line monitors were placed, the patient underwent general endotracheal anesthesia without incidence.  Skin of chest and legs was prepped with Betadine and draped in usual sterile manner.  Dictated under a separate note is description of right carotid endarterectomy.  Following the completion of the endarterectomy, we proceeded with coronary artery bypass graft. During this time, the patient remained hemodynamically stable.  Using a [employer] endo-vein harvesting system, vein was harvested from the right calf and thigh and was of good quality and caliber.  Median sternotomy was performed.  Left internal mammary artery was dissected down as a pedicle graft.  The distal artery was divided and had good free flow. Pericardium was opened.  Overall ventricular function appeared preserved.  The patient was systemically heparinized.  Ascending aorta was cannulated.  The right atrium was cannulated and aortic root  vent cardioplegia needle was introduced into the ascending aorta.  The patient was placed on cardiopulmonary bypass 2.4 L/min/m2.  Sites of anastomosis were selected and dissected at the epicardium.  The patient's body temperature was cooled to 32 degrees.  Aortic crossclamp was applied and 500 mL cold blood potassium cardioplegia was administered with diastolic arrest of the heart.  Myocardial  septal temperatures were monitored throughout the crossclamp period.  Attention was turned first to the obtuse marginal vessels.  The very distal circumflex which was totally occluded was very small branches and were too small to bypass.  The first obtuse marginal was partially intramyocardial.  It was a large vessel, admitted a 1.5 mm probe.  Using a running 7-0 Prolene, distal anastomosis was performed with a second reverse saphenous vein graft.  Attention was then turned to the diagonal coronary artery which was opened, also admitted a 1.5-mm probe.  Using a running 7-0 Prolene, distal anastomosis was performed.  Additional cold blood cardioplegia was administered down the vein graft.  Attention was then turned to the posterior descending coronary artery which was opened and admitted a 1.5 mm probe.  Using a running 7-0 Prolene, distal anastomosis was performed with second reverse saphenous vein graft. Attention was then turned to the left anterior descending coronary artery and the distal third of the vessel was opened and admitted a 1.5 mm probe.  Using running 8-0 Prolene, left internal mammary artery was anastomosed to the left anterior descending coronary artery.  With release of the bulldog on the mammary artery, there was appropriate rise in myocardial septal temperature.  Bulldog was placed back on the mammary artery with crossclamp still in place.  Three punch aortotomies were performed.  Each of the 3 vein grafts were anastomosed to the ascending aorta.  Air was evacuated from the grafts and partial occlusion clamp was removed with total cross-clamp time of 84 minutes. The patient was spontaneously converted to a sinus rhythm.  He had been maintained on renal dose dopamine because of this mildly elevated creatinine preoperatively and right renal artery stenosis.  Sites of anastomosis were inspected and free of bleeding.  The patient was then ventilated and weaned from  cardiopulmonary bypass without difficulty. He remained hemodynamically stable.  He was decannulated in usual fashion.  Protamine sulfate was administered.  With the operative field hemostatic, atrial and ventricular pacing wires were applied.  Graft markers were applied. Because of dense adhesions that were throughout the left chest, the left chest tube could not be placed.  Two Blake mediastinal drains were left in place.  The pericardium was reapproximated.  Sternum was closed with #6 stainless steel wire. Fascia closed with interrupted 0 Vicryl, running 3-0 Vicryl in subcutaneous tissue, 4-0 subcuticular stitch in skin edges.  Dry dressings were applied.  Sponge and needle count was reported as correct at the completion of procedure.  The patient tolerated the procedure without obvious complication.  He did not require any blood bank blood products during the operative procedure.  He was transferred to the Surgical Intensive Care Unit for further postoperative care, having tolerated the procedure without obvious complication.     Sheliah Plane, MD     EG/MEDQ  D:  11/03/2011  T:  11/03/2011  Job:  [id number]  cc:   Lyn Records, M.D.

## 2011-11-03 NOTE — Progress Notes (Signed)
[facility]    Subjective - POD #3 s/p R CEA/ CABG  Pt. Is intubated and sedated.  Left carotid incision is clean and dry.  D/C'd dressing.   Assessment/Plan: POD #3   Post operative respiratory failure, s/p re-intubation 6/22.

## 2011-11-03 NOTE — Progress Notes (Signed)
PULMONARY/CCM PROGRESS NOTE  Requesting MD/Service: Tyrone Sage Date of admission: 6/21 Date of consult: 11-18-22 Reason for consultation: post operative respiratory failure, s/p re-intubation Nov 18, 2022  Pt Profile: 70 yowm recalcitrant smoker who underwent elective CABG and R CEA 6/21. Extubated per TCTS protocol post operatively but developed acute respiratory distress with severe hypoxemia and purulent respiratory secretions. Re-intubated 11-18-2022.  Lines, Tubes, etc: ETT 6/21 >> 11-18-22 L IJ CVL 6/21 >> Radial A-line 6/21 >>  ETT 18-Nov-2022 >>   Microbiology: Resp 2022/11/18 >>   Antibiotics:  Cefuroxime 6/21 >> 11/18/22 Ceftriaxone 6/23 >>   Studies/Events: 6/21 CABG, R CEA 11/18/2022 reintubated for ineffective cough and airway secretions 6/23 AFRVR  Consults:  TCTS primary PCCM  Best Practice: DVT: LMWH SUP: PPI Nutrition: None Glycemic control: SSI  Sedation/analgesia: cont sedation protocol  Filed Vitals:   11/03/11 0830 11/03/11 0900 11/03/11 0930 11/03/11 1000  BP: 118/47 111/48 108/51 125/41  Pulse: 72 71 74 92  Temp:      TempSrc:      Resp: 13 14 13 22   Height:      Weight:      SpO2: 98% 98% 93% 86%   EXAM:  Gen: sedated, intubated HEENT: WNL Neck: JVP not visualized, s/p R CEA Lungs: diffuse rhonchi, diminished BS in R base Cardiovascular: RRR with extrasystoles, no M heard Abdomen: soft, NT, NABS Ext: No edema Neuro: MAEs, CNs intact  Intake/Output Summary (Last 24 hours) at 11/03/11 1049 Last data filed at 11/03/11 1000  Gross per 24 hour  Intake 1871.66 ml  Output    950 ml  Net 921.66 ml   DATA:  BMET    Component Value Date/Time   NA 135 11/03/2011 0310   K 4.8 11/03/2011 0310   CL 100 11/03/2011 0310   CO2 26 11/03/2011 0310   GLUCOSE 151* 11/03/2011 0310   BUN 48* 11/03/2011 0310   CREATININE 3.08* 11/03/2011 0310   CREATININE 1.58* 09/18/2011 1224   CALCIUM 8.3* 11/03/2011 0310   GFRNONAA 19* 11/03/2011 0310   GFRAA 22* 11/03/2011 0310   CBC    Component Value  Date/Time   WBC 9.6 11/03/2011 0310   RBC 2.96* 11/03/2011 0310   HGB 8.7* 11/03/2011 0310   HCT 26.7* 11/03/2011 0310   PLT 85* 11/03/2011 0310   MCV 90.2 11/03/2011 0310   MCH 29.4 11/03/2011 0310   MCHC 32.6 11/03/2011 0310   RDW 16.2* 11/03/2011 0310   CXR: bibasilar atx/infiltrates  IMPRESSION:   Principal Problem:  *Respiratory failure, post-operative Active Problems:  COPD (chronic obstructive pulmonary disease)  Occlusion and stenosis of carotid artery without mention of cerebral infarction  Purulent bronchitis  S/P CABG (coronary artery bypass graft)  Atrial fibrillation with RVR  Acute renal failure  71 year old male s/p VDRF post CABG.  Was extubated on 6/21 to be reintubated on 2022-11-18 for purulent bronchitis.   PLAN:  Begin PS trials today but no extubation due to mental status and increase secretion. Cont nebulized BDs. Abx as above. F/U on culture. Amiodarone initiated per TCTS Consult nutrition for TF as per nutrition. SBT in AM. NS bolus due to low UOP. Dopamine at 5 mcg per TCTS.  Sister in law updated bedside.  35 mins CCM time  Alyson Reedy, M.D. [facility] Pulmonary/Critical Care Medicine. Pager: [phone]. After hours pager: [phone].

## 2011-11-04 ENCOUNTER — Inpatient Hospital Stay (HOSPITAL_COMMUNITY): Payer: Medicare PPO

## 2011-11-04 DIAGNOSIS — Z48812 Encounter for surgical aftercare following surgery on the circulatory system: Secondary | ICD-10-CM

## 2011-11-04 LAB — CBC
HCT: 24.3 % — ABNORMAL LOW (ref 39.0–52.0)
Hemoglobin: 8.2 g/dL — ABNORMAL LOW (ref 13.0–17.0)
MCH: 29.8 pg (ref 26.0–34.0)
MCHC: 33.7 g/dL (ref 30.0–36.0)
MCV: 88.4 fL (ref 78.0–100.0)
Platelets: 106 10*3/uL — ABNORMAL LOW (ref 150–400)
RBC: 2.75 MIL/uL — ABNORMAL LOW (ref 4.22–5.81)
RDW: 15.9 % — ABNORMAL HIGH (ref 11.5–15.5)
WBC: 7.9 10*3/uL (ref 4.0–10.5)

## 2011-11-04 LAB — GLUCOSE, CAPILLARY
Glucose-Capillary: 105 mg/dL — ABNORMAL HIGH (ref 70–99)
Glucose-Capillary: 112 mg/dL — ABNORMAL HIGH (ref 70–99)
Glucose-Capillary: 123 mg/dL — ABNORMAL HIGH (ref 70–99)
Glucose-Capillary: 125 mg/dL — ABNORMAL HIGH (ref 70–99)
Glucose-Capillary: 86 mg/dL (ref 70–99)
Glucose-Capillary: 90 mg/dL (ref 70–99)

## 2011-11-04 LAB — BASIC METABOLIC PANEL
BUN: 52 mg/dL — ABNORMAL HIGH (ref 6–23)
CO2: 25 mEq/L (ref 19–32)
Calcium: 8.6 mg/dL (ref 8.4–10.5)
Chloride: 103 mEq/L (ref 96–112)
Creatinine, Ser: 2.78 mg/dL — ABNORMAL HIGH (ref 0.50–1.35)
GFR calc Af Amer: 25 mL/min — ABNORMAL LOW (ref >90)
GFR calc non Af Amer: 21 mL/min — ABNORMAL LOW (ref >90)
Glucose, Bld: 125 mg/dL — ABNORMAL HIGH (ref 70–99)
Potassium: 4.2 mEq/L (ref 3.5–5.1)
Sodium: 137 mEq/L (ref 135–145)

## 2011-11-04 LAB — POCT I-STAT 3, ART BLOOD GAS (G3+)
Bicarbonate: 25.2 mEq/L — ABNORMAL HIGH (ref 20.0–24.0)
O2 Saturation: 94 %
Patient temperature: 98.6
TCO2: 27 mmol/L (ref 0–100)
pCO2 arterial: 42.2 mmHg (ref 35.0–45.0)
pH, Arterial: 7.384 (ref 7.350–7.450)
pO2, Arterial: 73 mmHg — ABNORMAL LOW (ref 80.0–100.0)

## 2011-11-04 LAB — URINALYSIS, ROUTINE W REFLEX MICROSCOPIC
Glucose, UA: NEGATIVE mg/dL
Ketones, ur: NEGATIVE mg/dL
Leukocytes, UA: NEGATIVE
Protein, ur: 100 mg/dL — AB
pH: 5.5 (ref 5.0–8.0)

## 2011-11-04 LAB — CULTURE, RESPIRATORY W GRAM STAIN: Culture: NO GROWTH

## 2011-11-04 LAB — PHOSPHORUS: Phosphorus: 3.9 mg/dL (ref 2.3–4.6)

## 2011-11-04 LAB — MAGNESIUM: Magnesium: 2.5 mg/dL (ref 1.5–2.5)

## 2011-11-04 LAB — URINE MICROSCOPIC-ADD ON

## 2011-11-04 LAB — TSH: TSH: 0.194 u[IU]/mL — ABNORMAL LOW (ref 0.350–4.500)

## 2011-11-04 MED ORDER — FUROSEMIDE 10 MG/ML IJ SOLN
20.0000 mg | Freq: Four times a day (QID) | INTRAMUSCULAR | Status: AC
  Administered 2011-11-04 (×3): 20 mg via INTRAVENOUS
  Filled 2011-11-04 (×3): qty 2

## 2011-11-04 MED ORDER — PIPERACILLIN-TAZOBACTAM 3.375 G IVPB
3.3750 g | Freq: Three times a day (TID) | INTRAVENOUS | Status: DC
  Administered 2011-11-04 – 2011-11-07 (×9): 3.375 g via INTRAVENOUS
  Filled 2011-11-04 (×11): qty 50

## 2011-11-04 MED ORDER — VANCOMYCIN HCL IN DEXTROSE 1-5 GM/200ML-% IV SOLN
1000.0000 mg | INTRAVENOUS | Status: DC
  Administered 2011-11-04 – 2011-11-05 (×2): 1000 mg via INTRAVENOUS
  Filled 2011-11-04 (×2): qty 200

## 2011-11-04 NOTE — Progress Notes (Signed)
Versed gtt (20mg) and fentanyl gtt (125mcgs) wasted down sink. 2 nurse witness. Rodgers Likes RN, Kari Flinchum RN 

## 2011-11-04 NOTE — Progress Notes (Signed)
 bilious tube feeding residual. Will hold residual and not give back per verbal order from Dr. Tyrone Sage.  Will hold tube feeding and recheck residual at 12pm per verbal order from Dr. Tyrone Sage. Will continue to monitor.

## 2011-11-04 NOTE — Progress Notes (Signed)
VASCULAR LAB PRELIMINARY  PRELIMINARY  PRELIMINARY  PRELIMINARY  Right carotid duplex completed.    Preliminary report:  Right endarterectomy patent.  No evidence of thrombus, intimal flap or significant stenosis.  Mild heterogeneous plaque noted in the distal CCA.  Rayden Scheper, RVT 11/04/2011, 11:13 AM

## 2011-11-04 NOTE — Progress Notes (Signed)
This is a complicated case and very high risk as predicted by Dr. Tyrone Sage. Will follow and help as needed.

## 2011-11-04 NOTE — Progress Notes (Signed)
Patient examined and record reviewed.Hemodynamics stable,labs satisfactory.Patient had stable day.Continue current care.  Tol PS/CPAP today Alert but agitated, O2 sats ok Hope to extubate tomorrow if fluid overload improved Nathan [name],Nathan [name] 11/04/2011

## 2011-11-04 NOTE — Progress Notes (Signed)
Patient continues to have high residuals from tube feeding. Verbal order from Dr. Tyrone Sage to stop tube feeding and place OG to LIWS. Will continue to monitor

## 2011-11-04 NOTE — Care Management Note (Signed)
    Page 1 of 2   11/15/2011     4:01:13 PM   CARE MANAGEMENT NOTE 11/15/2011  Patient:  Nathan [name], Nathan [name]   Account Number:  [id number]  Date Initiated:  11/04/2011  Documentation initiated by:  AMERSON,JULIE  Subjective/Objective Assessment:   PT S/P CABG X 4 AND CEA ON 10/31/11.  PT WAS EXTUBATED POST OP BUT HAD TO BE REINTUBATED 6/22 FOR POST-OP RESPIRATORY FAILURE.  PTA, PT INDEPENDENT, LIVES WITH SPOUSE.     Action/Plan:   PT REMAINS ON VENTILATOR.  NO FAMILY PRESENT AT BEDSIDE. WILL FOLLOW UP WITH PT/FAMILY FOR DISCHARGE PLANNING NEEDS.   Anticipated DC Date:  11/09/2011   Anticipated DC Plan:  HOME W HOME HEALTH SERVICES  In-house referral  Clinical Social Worker      DC Planning Services  CM consult      [facility] Choice  HOME HEALTH   Choice offered to / List presented to:  C-3 Spouse   DME arranged  3-N-1  SHOWER STOOL      DME agency  [facility]     HH arranged  HH-1 RN  HH-2 PT  HH-5 SPEECH THERAPY      HH agency  [facility]   Status of service:  Completed, signed off Medicare Important Message given?   (If response is "NO", the following Medicare IM given date fields will be blank) Date Medicare IM given:   Date Additional Medicare IM given:    Discharge Disposition:  HOME W HOME HEALTH SERVICES  Per UR Regulation:  Reviewed for med. necessity/level of care/duration of stay  If discussed at Long Length of Stay Meetings, dates discussed:    Comments:  11/15/2011 1530 Unit Care Coordinator, Nathan [name] spoke with wife and gave list of [facility] agencies. Wife requested [facility]. Contacted [facility] for [facility] for scheduled D/c on 7/7. Contacted [facility] and they do not accept referrals on weekends. Will fax referral for DME to [facility] and follow up on 7/8. Made pt aware and he wants to pay out of pocket. Contacted [facility] for deliver of RW to room for pt to pay out of pocket. [facility] rep states they will bill him at home. Faxed order for 3n1 and shower stool to [facility] for  delivery on 7/8. Will place [facility] contact number on d/c instruction for pt to schedule deliver of DME from [facility]. Isidoro Donning RN CCM Case Mgmt phone [phone]  11/24/11 JULIE AMERSON,RN,BSN 1120 MET WITH PT AND WIFE TO DISCUSS DC PLANS.  WIFE CANNOT PROVIDE 24HR CARE AT DC, AND THERE IS NO ONE ELSE AVAILABLE TO ASSIST.  STILL PLAN DC TO SNF PENDING INSURANCE APPROVAL.  WILL ASK CSW TO FOLLOW UP.  11/12/11 JULIE AMERSON,RN,BSN 1045 MET WITH PT TO DISCUSS HOME NEEDS.  PT STATES WIFE WORKS; SHE CANNOT PROVIDE CARE AT DISCHARGE.  HE THINKS HE WILL NEED SHORT TERM SNF AT DISCHARGE FOR REHAB.  WILL CONSULT CSW TO FACILITATE DC TO SNF WHEN MEDICALLY STABLE.  WILL FOLLOW.  11/11/11 JULIE AMERSON,RN,BSN 1200 PT WITH CONT SWALLOWING ISSUES; UNABLE TO PASS FEEDING TUBE.  PROGRESSING WELL WITH PHYSICAL THERAPY.  PLAN TO TX TO STEPDOWN.  WILL CONT TO FOLLOW.

## 2011-11-04 NOTE — Progress Notes (Signed)
PULMONARY/CCM PROGRESS NOTE  Requesting MD/Service: Tyrone Sage Date of admission: 6/21 Date of consult: November 12, 2022 Reason for consultation: post operative respiratory failure, s/p re-intubation November 12, 2022  Pt Profile: 70 yowm recalcitrant smoker who underwent elective CABG and R CEA 6/21. Extubated per TCTS protocol post operatively but developed acute respiratory distress with severe hypoxemia and purulent respiratory secretions. Re-intubated Nov 12, 2022.  Lines, Tubes, etc: ETT 6/21 >> 12-Nov-2022 L IJ CVL 6/21 >> Radial A-line 6/21 >>  ETT Nov 12, 2022 >>   Microbiology: Resp 11-12-22 >>  Blood 6/25>>> Urine 6/25>>>  Antibiotics:  Cefuroxime 6/21 >> November 12, 2022 Ceftriaxone 6/23 >> 6/25 Vanc 6/25>>> Zosyn 6/25>>>  Studies/Events: 6/21 CABG, R CEA 11-12-22 reintubated for ineffective cough and airway secretions 6/23 AFRVR  Consults:  TCTS primary PCCM  Best Practice: DVT: LMWH SUP: PPI Nutrition: None Glycemic control: SSI  Sedation/analgesia: cont sedation protocol  Filed Vitals:   11/04/11 0746 11/04/11 0800 11/04/11 0822 11/04/11 0914  BP: 160/50 181/66  164/98  Pulse: 90 97  95  Temp:   101 F (38.3 C)   TempSrc:   Axillary   Resp: 19 21  28   Height:      Weight:      SpO2: 99% 89%  95%   EXAM:  Gen: sedated, intubated HEENT: WNL Neck: JVP not visualized, s/p R CEA Lungs: diffuse rhonchi, diminished BS in R base Cardiovascular: RRR with extrasystoles, no M heard Abdomen: soft, NT, NABS Ext: No edema Neuro: MAEs, CNs intact  Intake/Output Summary (Last 24 hours) at 11/04/11 0941 Last data filed at 11/04/11 0900  Gross per 24 hour  Intake 1997.27 ml  Output   2635 ml  Net -637.73 ml   DATA:  BMET    Component Value Date/Time   NA 137 11/04/2011 0341   K 4.2 11/04/2011 0341   CL 103 11/04/2011 0341   CO2 25 11/04/2011 0341   GLUCOSE 125* 11/04/2011 0341   BUN 52* 11/04/2011 0341   CREATININE 2.78* 11/04/2011 0341   CREATININE 1.58* 09/18/2011 1224   CALCIUM 8.6 11/04/2011 0341   GFRNONAA 21*  11/04/2011 0341   GFRAA 25* 11/04/2011 0341   CBC    Component Value Date/Time   WBC 7.9 11/04/2011 0341   RBC 2.75* 11/04/2011 0341   HGB 8.2* 11/04/2011 0341   HCT 24.3* 11/04/2011 0341   PLT 106* 11/04/2011 0341   MCV 88.4 11/04/2011 0341   MCH 29.8 11/04/2011 0341   MCHC 33.7 11/04/2011 0341   RDW 15.9* 11/04/2011 0341   CXR: bibasilar atx/infiltrates  IMPRESSION:   Principal Problem:  *Respiratory failure, post-operative Active Problems:  COPD (chronic obstructive pulmonary disease)  Occlusion and stenosis of carotid artery without mention of cerebral infarction  Purulent bronchitis  S/P CABG (coronary artery bypass graft)  Atrial fibrillation with RVR  Acute renal failure  71 year old male s/p VDRF post CABG.  Was extubated on 6/21 to be reintubated on 2022-11-12 for purulent bronchitis.   PLAN:  Begin PS trials today but no extubation due to mental status and increase secretion, mental status improved now that patient is on intermittent sedation but secretions remain a major issue, will diurese for that today. Cont nebulized BDs. Abx change rocephin to vanc/zosyn for fever and worsening pulmonary infiltrate. Blood and urine cultures sent on 6/25, sputum is negative on 11/12/2022, will reorder. Amiodarone initiated per TCTS TF residuals noted. SBT in AM. KVO IVF and monitor Cr as renal function seems to be slowly improving. Dopamine at 2.5 mcg per TCTS. Low  dose lasix today as to avoid worsening renal function.  35 mins CCM time  Alyson Reedy, M.D. [facility] Pulmonary/Critical Care Medicine. Pager: [phone]. After hours pager: [phone].

## 2011-11-04 NOTE — Progress Notes (Signed)
Patient ID: Nathan [name], male   DOB: [birth year], 71 y.o.   MRN: [id number] TCTS DAILY PROGRESS NOTE                   [address]            [place] [address]          [phone]      4 Days Post-Op Procedure(s) (LRB): CORONARY ARTERY BYPASS GRAFTING (CABG) (N/A) ENDARTERECTOMY CAROTID (Right)  Total Length of Stay:  LOS: 4 days   Subjective: Still on vent  Objective: Vital signs in last 24 hours: Temp:  [97.9 F (36.6 C)-99.2 F (37.3 C)] 98.2 F (36.8 C) (06/25 0400) Pulse Rate:  [71-99] 90  (06/25 0746) Cardiac Rhythm:  [-] Normal sinus rhythm (06/25 0600) Resp:  [12-22] 19  (06/25 0746) BP: (108-172)/(41-73) 160/50 mmHg (06/25 0746) SpO2:  [86 %-100 %] 99 % (06/25 0746) FiO2 (%):  [40 %-51.4 %] 40 % (06/25 0746) Weight:  [216 lb 0.8 oz (98 kg)] 216 lb 0.8 oz (98 kg) (06/25 0500)  Filed Weights   11/02/11 0600 11/03/11 0600 11/04/11 0500  Weight: 215 lb 9.8 oz (97.8 kg) 213 lb 13.5 oz (97 kg) 216 lb 0.8 oz (98 kg)    Weight change: 2 lb 3.3 oz (1 kg)   Hemodynamic parameters for last 24 hours:    Intake/Output from previous day: 06/24 0701 - 06/25 0700 In: 2004.1 [I.V.:584.1; NG/GT:1360; IV Piggyback:60] Out: 2010 [Urine:2010]  Intake/Output this shift:    Current Meds: Scheduled Meds:   . acetaminophen  1,000 mg Oral Q6H   Or  . acetaminophen (TYLENOL) oral liquid 160 mg/5 mL  975 mg Per Tube Q6H  . acetylcysteine  4 mL Nebulization Q6H  . allopurinol  300 mg Oral Daily  . antiseptic oral rinse  15 mL Mouth Rinse QID  . aspirin EC  325 mg Oral Daily   Or  . aspirin  324 mg Per Tube Daily  . bisacodyl  10 mg Oral Daily   Or  . bisacodyl  10 mg Rectal Daily  . budesonide  0.25 mg Nebulization Q6H  . buPROPion  150 mg Oral BID  . cefTRIAXone (ROCEPHIN)  IV  1 g Intravenous Q24H  . chlorhexidine  15 mL Mouth Rinse BID  . docusate  200 mg Per Tube Daily  . enoxaparin (LOVENOX) injection  30 mg Subcutaneous Q24H  . insulin  aspart  0-24 Units Subcutaneous Q4H  . insulin regular  0-10 Units Intravenous TID WC  . levalbuterol  0.63 mg Nebulization Q6H  . metoprolol tartrate  12.5 mg Oral BID   Or  . metoprolol tartrate  12.5 mg Per Tube BID  . pantoprazole (PROTONIX) IV  40 mg Intravenous QHS  . simvastatin  10 mg Oral QHS  . sodium chloride  500 mL Intravenous Once  . sodium chloride  3 mL Intravenous Q12H  . DISCONTD: colchicine  0.6 mg Oral Daily  . DISCONTD: pantoprazole  40 mg Oral Q1200   Continuous Infusions:   . sodium chloride 10 mL/hr at 11/03/11 0700  . amiodarone (NEXTERONE PREMIX) 360 mg/200 mL dextrose 30 mg/hr (11/04/11 0650)  . DOPamine 5 mcg/kg/min (11/04/11 0600)  . feeding supplement (OSMOLITE 1.2 CAL) 1,000 mL (11/03/11 2000)  . insulin (NOVOLIN-R) infusion Stopped (10/31/11 2005)  . lactated ringers 20 mL/hr at 11/01/11 0700  . nitroGLYCERIN Stopped (11/01/11 1100)  . phenylephrine (NEO-SYNEPHRINE) Adult infusion Stopped (10/31/11 1730)  .  DISCONTD: fentaNYL infusion INTRAVENOUS Stopped (11/03/11 0800)  . DISCONTD: midazolam (VERSED) infusion Stopped (11/03/11 0800)   PRN Meds:.fentaNYL, metoprolol, midazolam, morphine injection, ondansetron (ZOFRAN) IV, oxyCODONE, sodium chloride, DISCONTD: fentaNYL, DISCONTD: midazolam, DISCONTD: midazolam  General appearance: sedated on vent Neurologic: moving both sides better as less sedated Heart: regular rate and rhythm, S1, S2 normal, no murmur, click, rub or gallop Lungs: diminished breath sounds bibasilar Abdomen: soft, non-tender; bowel sounds normal; no masses,  no organomegaly Extremities: extremities normal, atraumatic, no cyanosis or edema and Homans sign is negative, no sign of DVT Wound: sternum stable  Lab Results: CBC: Basename 11/04/11 0341 11/03/11 0310  WBC 7.9 9.6  HGB 8.2* 8.7*  HCT 24.3* 26.7*  PLT 106* 85*   BMET:  Basename 11/04/11 0341 11/03/11 0310  NA 137 135  K 4.2 4.8  CL 103 100  CO2 25 26  GLUCOSE  125* 151*  BUN 52* 48*  CREATININE 2.78* 3.08*  CALCIUM 8.6 8.3*    PT/INR:  Basename 11/03/11 0310  LABPROT 17.2*  INR 1.38   Radiology: Dg Chest Port 1 View  11/03/2011  **ADDENDUM** CREATED: 11/03/2011 08:02:01  NG tube is again noted entering the stomach.  **END ADDENDUM** SIGNED BY: Aubery Lapping. Dover, M.D.   11/03/2011  *RADIOLOGY REPORT*  Clinical Data: Postop CABG.  PORTABLE CHEST - 1 VIEW  Comparison: 11/02/2011  Findings: Changes of CABG.  Endotracheal tube is 6 cm above the carina.  NG tube has been removed.  Patchy bilateral airspace opacities are stable.  Cardiomegaly.  Suspect layering right effusion.  IMPRESSION: No significant change.  Original Report Authenticated By: Cyndie Chime, M.D.     Assessment/Plan: S/P Procedure(s) (LRB): CORONARY ARTERY BYPASS GRAFTING (CABG) (N/A) ENDARTERECTOMY CAROTID (Right) Continue ABX therapy due to Post-op infection Continue foley due to strict I&O, patient critically ill, patient in ICU and urinary output monitoring cr improving Moves both sides better  Continue weaning vent as tolerated     Delight Ovens MD  Beeper [phone] Office [phone] 11/04/2011 7:55 AM

## 2011-11-04 NOTE — Progress Notes (Addendum)
VASCULAR AND VEIN SURGERY POST - OP CEA PROGRESS NOTE  Date of Surgery: 10/31/2011 Surgeon: Surgeon(s): Nada Libman, MD Delight Ovens, MD 4 Days Post-Op right Carotid Endarterectomy .  HPI: Nathan [name] is a 71 y.o. male who is 4 Days Post-Op right Carotid Endarterectomy and CABG.  Re-intubated due to respiratory failure IMAGING: Dg Chest Port 1 View  11/03/2011  **ADDENDUM** CREATED: 11/03/2011 08:02:01  NG tube is again noted entering the stomach.  **END ADDENDUM** SIGNED BY: Aubery Lapping. Dover, M.D.   11/03/2011  *RADIOLOGY REPORT*  Clinical Data: Postop CABG.  PORTABLE CHEST - 1 VIEW  Comparison: 11/02/2011  Findings: Changes of CABG.  Endotracheal tube is 6 cm above the carina.  NG tube has been removed.  Patchy bilateral airspace opacities are stable.  Cardiomegaly.  Suspect layering right effusion.  IMPRESSION: No significant change.  Original Report Authenticated By: Cyndie Chime, M.D.    Significant Diagnostic Studies: CBC Lab Results  Component Value Date   WBC 7.9 11/04/2011   HGB 8.2* 11/04/2011   HCT 24.3* 11/04/2011   MCV 88.4 11/04/2011   PLT 106* 11/04/2011    BMET    Component Value Date/Time   NA 137 11/04/2011 0341   K 4.2 11/04/2011 0341   CL 103 11/04/2011 0341   CO2 25 11/04/2011 0341   GLUCOSE 125* 11/04/2011 0341   BUN 52* 11/04/2011 0341   CREATININE 2.78* 11/04/2011 0341   CREATININE 1.58* 09/18/2011 1224   CALCIUM 8.6 11/04/2011 0341   GFRNONAA 21* 11/04/2011 0341   GFRAA 25* 11/04/2011 0341    COAG Lab Results  Component Value Date   INR 1.38 11/03/2011   INR 1.57* 10/31/2011   INR 1.02 10/29/2011   No results found for this basename: PTT      Intake/Output Summary (Last 24 hours) at 11/04/11 0726 Last data filed at 11/04/11 0600  Gross per 24 hour  Intake 2004.07 ml  Output   2010 ml  Net  -5.93 ml    Physical Exam:  BP Readings from Last 3 Encounters:  11/04/11 160/55  11/04/11 160/55  10/29/11 134/68   Temp Readings from Last 3  Encounters:  11/04/11 98.2 F (36.8 C) Oral  11/04/11 98.2 F (36.8 C) Oral  10/29/11 96.9 F (36.1 C) Oral   SpO2 Readings from Last 3 Encounters:  11/04/11 99%  11/04/11 99%  10/29/11 97%   Pulse Readings from Last 3 Encounters:  11/04/11 94  11/04/11 94  10/29/11 57   Right carotid incision is healing well. Patient has equal grip strength in B UE, however does not follow commands very well. He moves both feet and toes      Assessment: Nathan [name] is a 71 y.o. male is S/P Right Carotid endarterectomy  Will continue to follow neurologic status.  It is still difficult to determine if there is any deficit due to underlying sedation.  I suspect he is intact based on my exam today.  I will order carotid dopplers to make sure endarterectomy site is patent     Durene Cal

## 2011-11-04 NOTE — Progress Notes (Signed)
ANTIBIOTIC CONSULT NOTE - INITIAL  Pharmacy Consult for vancomycin + zosyn Indication: pneumonia  No Known Allergies  Patient Measurements: Height: 6' 0.83" (185 cm) Weight: 216 lb 0.8 oz (98 kg) (bed weight scale, one pillow) IBW/kg (Calculated) : 79.52   Vital Signs: Temp: 101 F (38.3 C) (06/25 0822) Temp src: Axillary (06/25 0822) BP: 181/73 mmHg (06/25 1000) Pulse Rate: 98  (06/25 1000) Intake/Output from previous day: 06/24 0701 - 06/25 0700 In: 2234.1 [I.V.:814.1; NG/GT:1360; IV Piggyback:60] Out: 2010 [Urine:2010] Intake/Output from this shift: Total I/O In: 274.4 [I.V.:154.4; NG/GT:120] Out: 775 [Urine:325; Emesis/NG output:450]  Labs:  [facility] 11/04/11 0341 11/03/11 0310 11/02/11 1730  WBC 7.9 9.6 10.4  HGB 8.2* 8.7* 9.2*  PLT 106* 85* 77*  LABCREA -- -- --  CREATININE 2.78* 3.08* 2.98*   Estimated Creatinine Clearance: 29.5 ml/min (by C-G formula based on Cr of 2.78). No results found for this basename: VANCOTROUGH:2,VANCOPEAK:2,VANCORANDOM:2,GENTTROUGH:2,GENTPEAK:2,GENTRANDOM:2,TOBRATROUGH:2,TOBRAPEAK:2,TOBRARND:2,AMIKACINPEAK:2,AMIKACINTROU:2,AMIKACIN:2, in the last 72 hours   Microbiology: Recent Results (from the past 720 hour(s))  SURGICAL PCR SCREEN     Status: Normal   Collection Time   10/29/11  9:42 AM      Component Value Range Status Comment   MRSA, PCR NEGATIVE  NEGATIVE Final    Staphylococcus aureus NEGATIVE  NEGATIVE Final   CULTURE, RESPIRATORY     Status: Normal   Collection Time   11/01/11  6:17 PM      Component Value Range Status Comment   Specimen Description TRACHEAL ASPIRATE   Final    Special Requests NONE   Final    Gram Stain     Final    Value: MODERATE WBC PRESENT, PREDOMINANTLY MONONUCLEAR     RARE SQUAMOUS EPITHELIAL CELLS PRESENT     RARE GRAM POSITIVE COCCI IN PAIRS   Culture NO GROWTH 2 DAYS   Final    Report Status 11/04/2011 FINAL   Final     Medical History: Past Medical History  Diagnosis Date  .  Aorto-iliac disease     bilat with stent 2007  . CAD (coronary artery disease)     with loop to the circumflex 2000, inferolateral infarction  . Hypertension   . Paroxysmal atrial flutter     ablation  . ED (erectile dysfunction)   . COPD (chronic obstructive pulmonary disease)   . Hyperlipidemia   . Stroke   . Shortness of breath   . GERD (gastroesophageal reflux disease)   . Gout     Medications:  Anti-infectives     Start     Dose/Rate Route Frequency Ordered Stop   11/04/11 1100  piperacillin-tazobactam (ZOSYN) IVPB 3.375 g       3.375 g 12.5 mL/hr over 240 Minutes Intravenous Every 8 hours 11/04/11 1023     11/04/11 1100   vancomycin (VANCOCIN) IVPB 1000 mg/200 mL premix        1,000 mg 200 mL/hr over 60 Minutes Intravenous Every 24 hours 11/04/11 1023     11/02/11 2000   cefTRIAXone (ROCEPHIN) 1 g in dextrose 5 % 50 mL IVPB  Status:  Discontinued        1 g 100 mL/hr over 30 Minutes Intravenous Every 24 hours 11/02/11 1812 11/04/11 1015   11/02/11 1800   cefTRIAXone (ROCEPHIN) 1 g in dextrose 5 % 50 mL IVPB  Status:  Discontinued        1 g 100 mL/hr over 30 Minutes Intravenous Every 24 hours 11/02/11 1747 11/02/11 1812   10/31/11 2215  vancomycin (VANCOCIN) IVPB 1000 mg/200 mL premix        1,000 mg 200 mL/hr over 60 Minutes Intravenous  Once 10/31/11 1616 10/31/11 2300   10/31/11 2200   cefUROXime (ZINACEF) 1.5 g in dextrose 5 % 50 mL IVPB        1.5 g 100 mL/hr over 30 Minutes Intravenous Every 12 hours 10/31/11 1616 11/02/11 1030   10/31/11 0400   vancomycin (VANCOCIN) 1,500 mg in sodium chloride 0.9 % 250 mL IVPB        1,500 mg 125 mL/hr over 120 Minutes Intravenous To Surgery 10/30/11 1315 10/31/11 0815   10/31/11 0400   cefUROXime (ZINACEF) 1.5 g in dextrose 5 % 50 mL IVPB        1.5 g 100 mL/hr over 30 Minutes Intravenous To Surgery 10/30/11 1315 10/31/11 1445   10/31/11 0400   cefUROXime (ZINACEF) 750 mg in dextrose 5 % 50 mL IVPB  Status:   Discontinued        750 mg 100 mL/hr over 30 Minutes Intravenous To Surgery 10/30/11 1315 10/31/11 1542         Assessment: 70 yom POD#4 CABGx4 + R CEA. Pt was extubated per protocol post-op but require reintubation on 6/22. Remains on the vent today. Initiated on empiric ceftriaxone on 6/23 but now with worsening pulmonary infiltrate and tmax of 101. Will broaden coverage from ceftriaxone to vancomycin + zosyn. Tracheal aspirate from 6/22 reported with no growth. New cultures sent today. Noted patients renal function has been fluctuating. Will need to monitor closely.   Goal of Therapy:  Vancomycin trough level 15-20 mcg/ml  Plan:  1. Vancomycin 1gm IV Q24H 2. Zosyn 3.375gm IV Q8H (4 hour infusion) 3. Monitor renal function closely 4. F/u C&S and trough at [facility] if continued  Rakisha Pincock, Drake Leach 11/04/2011,10:23 AM

## 2011-11-05 ENCOUNTER — Inpatient Hospital Stay (HOSPITAL_COMMUNITY): Payer: Medicare PPO

## 2011-11-05 LAB — BLOOD GAS, ARTERIAL
Bicarbonate: 30.1 mEq/L — ABNORMAL HIGH (ref 20.0–24.0)
Drawn by: 10006
O2 Saturation: 98.7 %
PEEP: 5 cmH2O
pCO2 arterial: 42.2 mmHg (ref 35.0–45.0)
pO2, Arterial: 90.7 mmHg (ref 80.0–100.0)

## 2011-11-05 LAB — GLUCOSE, CAPILLARY
Glucose-Capillary: 100 mg/dL — ABNORMAL HIGH (ref 70–99)
Glucose-Capillary: 103 mg/dL — ABNORMAL HIGH (ref 70–99)
Glucose-Capillary: 108 mg/dL — ABNORMAL HIGH (ref 70–99)
Glucose-Capillary: 134 mg/dL — ABNORMAL HIGH (ref 70–99)
Glucose-Capillary: 70 mg/dL (ref 70–99)
Glucose-Capillary: 82 mg/dL (ref 70–99)

## 2011-11-05 LAB — CBC
HCT: 25.6 % — ABNORMAL LOW (ref 39.0–52.0)
Hemoglobin: 8.6 g/dL — ABNORMAL LOW (ref 13.0–17.0)
MCH: 29.4 pg (ref 26.0–34.0)
MCHC: 33.6 g/dL (ref 30.0–36.0)
MCV: 87.4 fL (ref 78.0–100.0)
Platelets: 132 10*3/uL — ABNORMAL LOW (ref 150–400)
RBC: 2.93 MIL/uL — ABNORMAL LOW (ref 4.22–5.81)
RDW: 15.9 % — ABNORMAL HIGH (ref 11.5–15.5)
WBC: 8.8 10*3/uL (ref 4.0–10.5)

## 2011-11-05 LAB — BASIC METABOLIC PANEL
BUN: 45 mg/dL — ABNORMAL HIGH (ref 6–23)
CO2: 29 mEq/L (ref 19–32)
Calcium: 8.8 mg/dL (ref 8.4–10.5)
Chloride: 101 mEq/L (ref 96–112)
Creatinine, Ser: 2.06 mg/dL — ABNORMAL HIGH (ref 0.50–1.35)
GFR calc Af Amer: 35 mL/min — ABNORMAL LOW (ref >90)
GFR calc non Af Amer: 31 mL/min — ABNORMAL LOW (ref >90)
Glucose, Bld: 110 mg/dL — ABNORMAL HIGH (ref 70–99)
Potassium: 3.7 mEq/L (ref 3.5–5.1)
Sodium: 141 mEq/L (ref 135–145)

## 2011-11-05 LAB — URINE CULTURE
Colony Count: NO GROWTH
Culture  Setup Time: 201306251559
Culture: NO GROWTH

## 2011-11-05 LAB — MAGNESIUM: Magnesium: 2.2 mg/dL (ref 1.5–2.5)

## 2011-11-05 MED ORDER — VANCOMYCIN HCL 1000 MG IV SOLR
1250.0000 mg | INTRAVENOUS | Status: DC
  Filled 2011-11-05: qty 1250

## 2011-11-05 MED ORDER — FUROSEMIDE 10 MG/ML IJ SOLN
40.0000 mg | Freq: Once | INTRAMUSCULAR | Status: AC
  Administered 2011-11-05: 40 mg via INTRAVENOUS
  Filled 2011-11-05: qty 4

## 2011-11-05 MED ORDER — SODIUM CHLORIDE 0.9 % IV SOLN
0.4000 ug/kg/h | INTRAVENOUS | Status: DC
  Administered 2011-11-05: 0.7 ug/kg/h via INTRAVENOUS
  Administered 2011-11-05: 0.4 ug/kg/h via INTRAVENOUS
  Filled 2011-11-05 (×2): qty 2

## 2011-11-05 MED ORDER — FUROSEMIDE 10 MG/ML IJ SOLN
20.0000 mg | Freq: Three times a day (TID) | INTRAMUSCULAR | Status: AC
  Administered 2011-11-05 (×2): 20 mg via INTRAVENOUS
  Filled 2011-11-05 (×2): qty 2

## 2011-11-05 MED ORDER — VANCOMYCIN HCL 500 MG IV SOLR
250.0000 mg | INTRAVENOUS | Status: AC
  Filled 2011-11-05: qty 250

## 2011-11-05 MED ORDER — POTASSIUM CHLORIDE 10 MEQ/50ML IV SOLN
10.0000 meq | INTRAVENOUS | Status: AC
  Administered 2011-11-05 (×2): 10 meq via INTRAVENOUS
  Filled 2011-11-05: qty 100

## 2011-11-05 MED ORDER — DEXTROSE 50 % IV SOLN
25.0000 mL | Freq: Once | INTRAVENOUS | Status: AC | PRN
  Administered 2011-11-06: 25 mL via INTRAVENOUS
  Filled 2011-11-05: qty 50

## 2011-11-05 MED ORDER — DEXMEDETOMIDINE HCL 100 MCG/ML IV SOLN
0.4000 ug/kg/h | INTRAVENOUS | Status: DC
  Administered 2011-11-05: 0.7 ug/kg/h via INTRAVENOUS
  Administered 2011-11-06: 1 ug/kg/h via INTRAVENOUS
  Filled 2011-11-05 (×5): qty 4

## 2011-11-05 MED ORDER — POTASSIUM CHLORIDE 20 MEQ/15ML (10%) PO LIQD
40.0000 meq | Freq: Three times a day (TID) | ORAL | Status: AC
  Administered 2011-11-05 (×2): 40 meq
  Filled 2011-11-05 (×2): qty 30

## 2011-11-05 MED FILL — Mannitol IV Soln 20%: INTRAVENOUS | Qty: 500 | Status: AC

## 2011-11-05 MED FILL — Electrolyte-R (PH 7.4) Solution: INTRAVENOUS | Qty: 4000 | Status: AC

## 2011-11-05 MED FILL — Heparin Sodium (Porcine) Inj 1000 Unit/ML: INTRAMUSCULAR | Qty: 30 | Status: AC

## 2011-11-05 MED FILL — Sodium Chloride IV Soln 0.9%: INTRAVENOUS | Qty: 1000 | Status: AC

## 2011-11-05 MED FILL — Lidocaine HCl IV Inj 20 MG/ML: INTRAVENOUS | Qty: 5 | Status: AC

## 2011-11-05 MED FILL — Sodium Chloride Irrigation Soln 0.9%: Qty: 3000 | Status: AC

## 2011-11-05 MED FILL — Heparin Sodium (Porcine) Inj 1000 Unit/ML: INTRAMUSCULAR | Qty: 10 | Status: AC

## 2011-11-05 MED FILL — Sodium Bicarbonate IV Soln 8.4%: INTRAVENOUS | Qty: 50 | Status: AC

## 2011-11-05 NOTE — Progress Notes (Signed)
Patient ID: Nathan [name], male   DOB: May 30, 1940, 71 y.o.   MRN: [id number] TCTS DAILY PROGRESS NOTE                   [address]            [place] [address]          [phone]      5 Days Post-Op Procedure(s) (LRB): CORONARY ARTERY BYPASS GRAFTING (CABG) (N/A) ENDARTERECTOMY CAROTID (Right)  Total Length of Stay:  LOS: 5 days   Subjective: More alert today moves all extremities to command  Objective: Vital signs in last 24 hours: Temp:  [97.9 F (36.6 C)-99.4 F (37.4 C)] 99.3 F (37.4 C) (06/26 0814) Pulse Rate:  [72-98] 95  (06/26 0800) Cardiac Rhythm:  [-] Normal sinus rhythm (06/26 0800) Resp:  [13-31] 24  (06/26 0800) BP: (108-194)/(43-98) 108/87 mmHg (06/26 0800) SpO2:  [91 %-100 %] 100 % (06/26 0800) FiO2 (%):  [40 %-99.7 %] 40.5 % (06/26 0800) Weight:  [214 lb 4.6 oz (97.2 kg)] 214 lb 4.6 oz (97.2 kg) (06/26 0400)  Filed Weights   11/03/11 0600 11/04/11 0500 11/05/11 0400  Weight: 213 lb 13.5 oz (97 kg) 216 lb 0.8 oz (98 kg) 214 lb 4.6 oz (97.2 kg)    Weight change: -1 lb 12.2 oz (-0.8 kg)   Hemodynamic parameters for last 24 hours:    Intake/Output from previous day: 06/25 0701 - 06/26 0700 In: 1776.4 [I.V.:780.4; NG/GT:630; IV Piggyback:366] Out: 5950 [Urine:4950; Emesis/NG output:1000]  Intake/Output this shift: Total I/O In: 41.3 [I.V.:41.3] Out: 80 [Urine:80]  Current Meds: Scheduled Meds:   . acetaminophen  1,000 mg Oral Q6H   Or  . acetaminophen (TYLENOL) oral liquid 160 mg/5 mL  975 mg Per Tube Q6H  . allopurinol  300 mg Oral Daily  . antiseptic oral rinse  15 mL Mouth Rinse QID  . aspirin EC  325 mg Oral Daily   Or  . aspirin  324 mg Per Tube Daily  . bisacodyl  10 mg Oral Daily   Or  . bisacodyl  10 mg Rectal Daily  . budesonide  0.25 mg Nebulization Q6H  . buPROPion  150 mg Oral BID  . chlorhexidine  15 mL Mouth Rinse BID  . docusate  200 mg Per Tube Daily  . enoxaparin (LOVENOX) injection  30 mg  Subcutaneous Q24H  . furosemide  20 mg Intravenous Q6H  . insulin aspart  0-24 Units Subcutaneous Q4H  . insulin regular  0-10 Units Intravenous TID WC  . levalbuterol  0.63 mg Nebulization Q6H  . metoprolol tartrate  12.5 mg Oral BID   Or  . metoprolol tartrate  12.5 mg Per Tube BID  . pantoprazole (PROTONIX) IV  40 mg Intravenous QHS  . piperacillin-tazobactam (ZOSYN)  IV  3.375 g Intravenous Q8H  . simvastatin  10 mg Oral QHS  . sodium chloride  3 mL Intravenous Q12H  . vancomycin  1,000 mg Intravenous Q24H  . DISCONTD: cefTRIAXone (ROCEPHIN)  IV  1 g Intravenous Q24H   Continuous Infusions:   . sodium chloride 20 mL/hr at 11/05/11 0800  . amiodarone (NEXTERONE PREMIX) 360 mg/200 mL dextrose 30 mg/hr (11/05/11 0800)  . DOPamine 2.5 mcg/kg/min (11/05/11 0800)  . feeding supplement (OSMOLITE 1.2 CAL) 1,000 mL (11/03/11 2000)  . insulin (NOVOLIN-R) infusion Stopped (10/31/11 2005)  . lactated ringers 20 mL/hr at 11/01/11 0700  . DISCONTD: nitroGLYCERIN Stopped (11/01/11 1100)  .  DISCONTD: phenylephrine (NEO-SYNEPHRINE) Adult infusion Stopped (10/31/11 1730)   PRN Meds:.fentaNYL, metoprolol, midazolam, morphine injection, ondansetron (ZOFRAN) IV, oxyCODONE, sodium chloride  General appearance: alert and cooperative Neurologic: intact Heart: regular rate and rhythm, S1, S2 normal, no murmur, click, rub or gallop and normal apical impulse Lungs: diminished breath sounds bibasilar  Lab Results: CBC: Basename 11/05/11 0415 11/04/11 0341  WBC 8.8 7.9  HGB 8.6* 8.2*  HCT 25.6* 24.3*  PLT 132* 106*   BMET:  Basename 11/05/11 0415 11/04/11 0341  NA 141 137  K 3.7 4.2  CL 101 103  CO2 29 25  GLUCOSE 110* 125*  BUN 45* 52*  CREATININE 2.06* 2.78*  CALCIUM 8.8 8.6    PT/INR:  Basename 11/03/11 0310  LABPROT 17.2*  INR 1.38   Radiology: Dg Chest Port 1 View  11/05/2011  *RADIOLOGY REPORT*  Clinical Data: Coronary bypass, carotid endarterectomy, ventilatory support   PORTABLE CHEST - 1 VIEW  Comparison: 11/04/2011  Findings: Endotracheal tube 4.6 cm above the carina.  Left IJ vascular sheath in the left innominate vein.  NG tube extends below the hemidiaphragm with the tip not visualized.  Coronary bypass changes noted.  Heart remains enlarged with stable diffuse edema pattern and small effusions compatible with CHF.  Slight interval improvement in the left lower lobe aeration.  No pneumothorax.  IMPRESSION: Stable mild CHF pattern.  Improving left lower lobe atelectasis  Original Report Authenticated By: Judie Petit. Ruel Favors, M.D.   Dg Chest Port 1 View  11/04/2011  *RADIOLOGY REPORT*  Clinical Data: Evaluate endotracheal tube position.COPD. Hypertension.  PORTABLE CHEST - 1 VIEW  Comparison: 11/03/2011  Findings: Endotracheal tube terminates at the level of the clavicles, 5.3 cm above carina.  Nasogastric tube extends beyond the  inferior aspect of the film.  A left IJ Cordis sheath is unchanged.  Mild cardiomegaly.  Small bilateral pleural effusions. No pneumothorax.  Decreased lung volumes with slight increase in moderate interstitial edema.  Improved right base aeration with similar to increased left base air space disease.  IMPRESSION:  1.  Slight worsening in aeration with decreased lung volumes and increased congestive heart failure. 2.  Shifting airspace opacities, favored to represent atelectasis or alveolar edema.  At the left lung base, concurrent infection cannot be excluded. 3.  Small bilateral pleural effusions.  Original Report Authenticated By: Consuello Bossier, M.D.     Assessment/Plan: S/P Procedure(s) (LRB): CORONARY ARTERY BYPASS GRAFTING (CABG) (N/A) ENDARTERECTOMY CAROTID (Right) Mobilize Diuresis wean vent and poss try to extubate, mental statusd improved     Delight Ovens MD  Beeper [phone] Office [phone] 11/05/2011 8:32 AM

## 2011-11-05 NOTE — Progress Notes (Signed)
PULMONARY/CCM PROGRESS NOTE  Requesting MD/Service: Tyrone Sage Date of admission: 6/21 Date of consult: 2022/11/09 Reason for consultation: post operative respiratory failure, s/p re-intubation 11-09-2022  Pt Profile: 70 yowm recalcitrant smoker who underwent elective CABG and R CEA 6/21. Extubated per TCTS protocol post operatively but developed acute respiratory distress with severe hypoxemia and purulent respiratory secretions. Re-intubated 11-09-2022.  Lines, Tubes, etc: ETT 6/21 >> November 09, 2022 L IJ CVL 6/21 >> Radial A-line 6/21 >>  ETT 11-09-2022 >>   Microbiology: Resp 11/09/2022 >> GPR Blood 6/25>>>NTD Urine 6/25>>>NTD  Antibiotics:  Cefuroxime 6/21 >> 11-09-2022 Ceftriaxone 6/23 >> 6/25 Vanc 6/25>>> Zosyn 6/25>>>  Studies/Events: 6/21 CABG, R CEA 11/09/2022 reintubated for ineffective cough and airway secretions 6/23 AFRVR  Consults:  TCTS primary PCCM  Best Practice: DVT: LMWH SUP: PPI Nutrition: None Glycemic control: SSI  Sedation/analgesia: cont sedation protocol  Filed Vitals:   11/05/11 0700 11/05/11 0740 11/05/11 0800 11/05/11 0814  BP: 165/64  108/87   Pulse: 83  95   Temp:    99.3 F (37.4 C)  TempSrc:    Oral  Resp: 15  24   Height:      Weight:      SpO2: 98% 100% 100%    EXAM:  Gen: sedated, intubated HEENT: WNL Neck: JVP not visualized, s/p R CEA Lungs: diffuse rhonchi, diminished BS in R base Cardiovascular: RRR with extrasystoles, no M heard Abdomen: soft, NT, NABS Ext: No edema Neuro: MAEs, CNs intact  Intake/Output Summary (Last 24 hours) at 11/05/11 0912 Last data filed at 11/05/11 0800  Gross per 24 hour  Intake 1614.57 ml  Output   5330 ml  Net -3715.43 ml   DATA:  BMET    Component Value Date/Time   NA 141 11/05/2011 0415   K 3.7 11/05/2011 0415   CL 101 11/05/2011 0415   CO2 29 11/05/2011 0415   GLUCOSE 110* 11/05/2011 0415   BUN 45* 11/05/2011 0415   CREATININE 2.06* 11/05/2011 0415   CREATININE 1.58* 09/18/2011 1224   CALCIUM 8.8 11/05/2011 0415   GFRNONAA 31*  11/05/2011 0415   GFRAA 35* 11/05/2011 0415   CBC    Component Value Date/Time   WBC 8.8 11/05/2011 0415   RBC 2.93* 11/05/2011 0415   HGB 8.6* 11/05/2011 0415   HCT 25.6* 11/05/2011 0415   PLT 132* 11/05/2011 0415   MCV 87.4 11/05/2011 0415   MCH 29.4 11/05/2011 0415   MCHC 33.6 11/05/2011 0415   RDW 15.9* 11/05/2011 0415   CXR: bibasilar atx/infiltrates  IMPRESSION:   Principal Problem:  *Respiratory failure, post-operative Active Problems:  COPD (chronic obstructive pulmonary disease)  Occlusion and stenosis of carotid artery without mention of cerebral infarction  Purulent bronchitis  S/P CABG (coronary artery bypass graft)  Atrial fibrillation with RVR  Acute renal failure  71 year old male s/p VDRF post CABG.  Was extubated on 6/21 to be reintubated on Nov 09, 2022 for purulent bronchitis.   PLAN:  Mental status is improved (arousable) but very agitated with large amounts of watery secretions preventing safe extubation. Start precedex today. PS trials. Cont nebulized BDs. Continue vanc/zosyn and f/u on cultures. Blood and urine cultures on 6/25 NTD, sputum is negative on 09-Nov-2022 but 6/25 with GPR speciation pending. Amiodarone initiated per TCTS TF residuals noted. SBT in AM. KVO IVF and monitor Cr as renal function seems to be slowly improving. Dopamine at 2.5 mcg per TCTS. Continue lasix, patient is grossly fluid overloaded. Electrolyte replacement as indicated.  35 mins CCM  time  Alyson Reedy, M.D. [facility] Pulmonary/Critical Care Medicine. Pager: [phone]. After hours pager: [phone].

## 2011-11-05 NOTE — Progress Notes (Signed)
Patient ID: Nathan [name], male   DOB: December 14, 1940, 71 y.o.   MRN: [id number]                   [address]            [place] [address]          [phone]     5 Days Post-Op Procedure(s) (LRB): CORONARY ARTERY BYPASS GRAFTING (CABG) (N/A) ENDARTERECTOMY CAROTID (Right)  Total Length of Stay:  LOS: 5 days  BP 108/43  Pulse 65  Temp 98.7 F (37.1 C) (Oral)  Resp 15  Ht 6' 0.84" (1.85 m)  Wt 214 lb 4.6 oz (97.2 kg)  BMI 28.40 kg/m2  SpO2 100%     . sodium chloride 20 mL/hr at 11/05/11 1700  . amiodarone (NEXTERONE PREMIX) 360 mg/200 mL dextrose 30 mg/hr (11/05/11 1700)  . dexmedetomidine (PRECEDEX) IV infusion for high rates 0.7 mcg/kg/hr (11/05/11 1700)  . DOPamine 2.5 mcg/kg/min (11/05/11 1700)  . feeding supplement (OSMOLITE 1.2 CAL) 1,000 mL (11/03/11 2000)  . insulin (NOVOLIN-R) infusion Stopped (10/31/11 2005)  . lactated ringers 20 mL/hr at 11/01/11 0700  . DISCONTD: dexmedetomidine (PRECEDEX) IV infusion 0.7 mcg/kg/hr (11/05/11 1400)     Lab Results  Component Value Date   WBC 8.8 11/05/2011   HGB 8.6* 11/05/2011   HCT 25.6* 11/05/2011   PLT 132* 11/05/2011   GLUCOSE 110* 11/05/2011   ALT 10 11/03/2011   AST 20 11/03/2011   NA 141 11/05/2011   K 3.7 11/05/2011   CL 101 11/05/2011   CREATININE 2.06* 11/05/2011   BUN 45* 11/05/2011   CO2 29 11/05/2011   TSH 0.194* 11/04/2011   INR 1.38 11/03/2011   HGBA1C 5.3 10/29/2011   Renal function improving Still on ventilator  Delight Ovens MD  Beeper [phone] Office [phone] 11/05/2011 5:50 PM

## 2011-11-05 NOTE — Progress Notes (Addendum)
ANTIBIOTIC CONSULT NOTE - FOLLOW UP  Pharmacy Consult for vancomycin and Zosyn Indication: pneumonia  No Known Allergies  Patient Measurements: Height: 6' 0.83" (185 cm) Weight: 214 lb 4.6 oz (97.2 kg) IBW/kg (Calculated) : 79.52   Vital Signs: Temp: 99.3 F (37.4 C) (06/26 0814) Temp src: Oral (06/26 0814) BP: 113/55 mmHg (06/26 1100) Pulse Rate: 76  (06/26 1100) Intake/Output from previous day: 06/25 0701 - 06/26 0700 In: 1776.4 [I.V.:780.4; NG/GT:630; IV Piggyback:366] Out: 5950 [Urine:4950; Emesis/NG output:1000] Intake/Output from this shift: Total I/O In: 464.6 [I.V.:160.6; IV Piggyback:304] Out: 580 [Urine:580]  Labs:  [facility] 11/05/11 0415 11/04/11 0341 11/03/11 0310  WBC 8.8 7.9 9.6  HGB 8.6* 8.2* 8.7*  PLT 132* 106* 85*  LABCREA -- -- --  CREATININE 2.06* 2.78* 3.08*   Estimated Creatinine Clearance: 39.7 ml/min (by C-G formula based on Cr of 2.06). No results found for this basename: VANCOTROUGH:2,VANCOPEAK:2,VANCORANDOM:2,GENTTROUGH:2,GENTPEAK:2,GENTRANDOM:2,TOBRATROUGH:2,TOBRAPEAK:2,TOBRARND:2,AMIKACINPEAK:2,AMIKACINTROU:2,AMIKACIN:2, in the last 72 hours   Microbiology: Recent Results (from the past 720 hour(s))  SURGICAL PCR SCREEN     Status: Normal   Collection Time   10/29/11  9:42 AM      Component Value Range Status Comment   MRSA, PCR NEGATIVE  NEGATIVE Final    Staphylococcus aureus NEGATIVE  NEGATIVE Final   CULTURE, RESPIRATORY     Status: Normal   Collection Time   11/01/11  6:17 PM      Component Value Range Status Comment   Specimen Description TRACHEAL ASPIRATE   Final    Special Requests NONE   Final    Gram Stain     Final    Value: MODERATE WBC PRESENT, PREDOMINANTLY MONONUCLEAR     RARE SQUAMOUS EPITHELIAL CELLS PRESENT     RARE GRAM POSITIVE COCCI IN PAIRS   Culture NO GROWTH 2 DAYS   Final    Report Status 11/04/2011 FINAL   Final   CULTURE, BLOOD (ROUTINE X 2)     Status: Normal (Preliminary result)   Collection Time   11/04/11 10:30 AM      Component Value Range Status Comment   Specimen Description BLOOD LEFT ARM   Final    Special Requests BOTTLES DRAWN AEROBIC AND ANAEROBIC 10CC   Final    Culture  Setup Time [id number]   Final    Culture     Final    Value:        BLOOD CULTURE RECEIVED NO GROWTH TO DATE CULTURE WILL BE HELD FOR 5 DAYS BEFORE ISSUING A FINAL NEGATIVE REPORT   Report Status PENDING   Incomplete   CULTURE, BLOOD (ROUTINE X 2)     Status: Normal (Preliminary result)   Collection Time   11/04/11 10:40 AM      Component Value Range Status Comment   Specimen Description BLOOD RIGHT ARM   Final    Special Requests BOTTLES DRAWN AEROBIC AND ANAEROBIC 10CC   Final    Culture  Setup Time [id number]   Final    Culture     Final    Value:        BLOOD CULTURE RECEIVED NO GROWTH TO DATE CULTURE WILL BE HELD FOR 5 DAYS BEFORE ISSUING A FINAL NEGATIVE REPORT   Report Status PENDING   Incomplete   CULTURE, RESPIRATORY     Status: Normal (Preliminary result)   Collection Time   11/04/11 11:23 AM      Component Value Range Status Comment   Specimen Description TRACHEAL ASPIRATE   Final  Special Requests NONE   Final    Gram Stain     Final    Value: NO WBC SEEN     FEW SQUAMOUS EPITHELIAL CELLS PRESENT     RARE GRAM POSITIVE RODS   Culture Culture reincubated for better growth   Final    Report Status PENDING   Incomplete     Anti-infectives     Start     Dose/Rate Route Frequency Ordered Stop   11/04/11 1100  piperacillin-tazobactam (ZOSYN) IVPB 3.375 g       3.375 g 12.5 mL/hr over 240 Minutes Intravenous Every 8 hours 11/04/11 1023     11/04/11 1100   vancomycin (VANCOCIN) IVPB 1000 mg/200 mL premix        1,000 mg 200 mL/hr over 60 Minutes Intravenous Every 24 hours 11/04/11 1023     11/02/11 2000   cefTRIAXone (ROCEPHIN) 1 g in dextrose 5 % 50 mL IVPB  Status:  Discontinued        1 g 100 mL/hr over 30 Minutes Intravenous Every 24 hours 11/02/11 1812 11/04/11 1015    11/02/11 1800   cefTRIAXone (ROCEPHIN) 1 g in dextrose 5 % 50 mL IVPB  Status:  Discontinued        1 g 100 mL/hr over 30 Minutes Intravenous Every 24 hours 11/02/11 1747 11/02/11 1812   10/31/11 2215   vancomycin (VANCOCIN) IVPB 1000 mg/200 mL premix        1,000 mg 200 mL/hr over 60 Minutes Intravenous  Once 10/31/11 1616 10/31/11 2300   10/31/11 2200   cefUROXime (ZINACEF) 1.5 g in dextrose 5 % 50 mL IVPB        1.5 g 100 mL/hr over 30 Minutes Intravenous Every 12 hours 10/31/11 1616 11/02/11 1030   10/31/11 0400   vancomycin (VANCOCIN) 1,500 mg in sodium chloride 0.9 % 250 mL IVPB        1,500 mg 125 mL/hr over 120 Minutes Intravenous To Surgery 10/30/11 1315 10/31/11 0815   10/31/11 0400   cefUROXime (ZINACEF) 1.5 g in dextrose 5 % 50 mL IVPB        1.5 g 100 mL/hr over 30 Minutes Intravenous To Surgery 10/30/11 1315 10/31/11 1445   10/31/11 0400   cefUROXime (ZINACEF) 750 mg in dextrose 5 % 50 mL IVPB  Status:  Discontinued        750 mg 100 mL/hr over 30 Minutes Intravenous To Surgery 10/30/11 1315 10/31/11 1542          Assessment: 71 yo male post-op CABG and R CEA on day 2 vancomycin and Zosyn for PNA. Renal function has improved. Will adjust vancomycin dose.  Goal of Therapy:  Vancomycin trough level 15-20 mcg/ml  Plan:  1. Increase vancomycin to 1250 mg IV q24h - giving and extra 250 mg now in addition to 1 g infusing currently 2. Continue Zosyn 3.375 g IV q8h to be infused over 4 hours 3. Monitor renal function closely  [facility], [address].D., BCPS Clinical Pharmacist Pager: [phone] 11/05/2011 11:20 AM

## 2011-11-05 NOTE — Progress Notes (Signed)
CBG @ 2345 was 53.  Hypoglycemia protocol initiated and 25 ml of D 50 given.  I will continue to monitor.

## 2011-11-05 NOTE — Progress Notes (Signed)
Nutrition Follow-up  Gastric Residuals on 6/25: 130 ml (4am), 450 ml (8am), 200 ml (12pm), 310 ml (4pm)  TF was held after gastric residual of 450 ml was obtained. Verbal order from MD to hold TF and continue to check residuals. Per RN currently on shift, TF has yet to be resumed at this time.  Diet Order:  NPO TF: Osmolite 1.2 at 60 ml/hr - held  Meds: Scheduled Meds:   . acetaminophen  1,000 mg Oral Q6H   Or  . acetaminophen (TYLENOL) oral liquid 160 mg/5 mL  975 mg Per Tube Q6H  . allopurinol  300 mg Oral Daily  . antiseptic oral rinse  15 mL Mouth Rinse QID  . aspirin EC  325 mg Oral Daily   Or  . aspirin  324 mg Per Tube Daily  . bisacodyl  10 mg Oral Daily   Or  . bisacodyl  10 mg Rectal Daily  . budesonide  0.25 mg Nebulization Q6H  . buPROPion  150 mg Oral BID  . chlorhexidine  15 mL Mouth Rinse BID  . docusate  200 mg Per Tube Daily  . enoxaparin (LOVENOX) injection  30 mg Subcutaneous Q24H  . furosemide  20 mg Intravenous Q6H  . furosemide  20 mg Intravenous Q8H  . furosemide  40 mg Intravenous Once  . insulin aspart  0-24 Units Subcutaneous Q4H  . insulin regular  0-10 Units Intravenous TID WC  . levalbuterol  0.63 mg Nebulization Q6H  . metoprolol tartrate  12.5 mg Oral BID   Or  . metoprolol tartrate  12.5 mg Per Tube BID  . pantoprazole (PROTONIX) IV  40 mg Intravenous QHS  . piperacillin-tazobactam (ZOSYN)  IV  3.375 g Intravenous Q8H  . potassium chloride  10 mEq Intravenous Q1 Hr x 2  . potassium chloride  40 mEq Per Tube TID  . simvastatin  10 mg Oral QHS  . sodium chloride  3 mL Intravenous Q12H  . vancomycin  1,250 mg Intravenous Q24H  . vancomycin  250 mg Intravenous NOW  . DISCONTD: vancomycin  1,000 mg Intravenous Q24H   Continuous Infusions:   . sodium chloride 20 mL/hr at 11/05/11 1200  . amiodarone (NEXTERONE PREMIX) 360 mg/200 mL dextrose 30 mg/hr (11/05/11 1200)  . dexmedetomidine (PRECEDEX) IV infusion 0.7 mcg/kg/hr (11/05/11 1200)  .  DOPamine 2.5 mcg/kg/min (11/05/11 1200)  . feeding supplement (OSMOLITE 1.2 CAL) 1,000 mL (11/03/11 2000)  . insulin (NOVOLIN-R) infusion Stopped (10/31/11 2005)  . lactated ringers 20 mL/hr at 11/01/11 0700   PRN Meds:.fentaNYL, metoprolol, midazolam, morphine injection, ondansetron (ZOFRAN) IV, oxyCODONE, sodium chloride  Labs:  CMP     Component Value Date/Time   NA 141 11/05/2011 0415   K 3.7 11/05/2011 0415   CL 101 11/05/2011 0415   CO2 29 11/05/2011 0415   GLUCOSE 110* 11/05/2011 0415   BUN 45* 11/05/2011 0415   CREATININE 2.06* 11/05/2011 0415   CREATININE 1.58* 09/18/2011 1224   CALCIUM 8.8 11/05/2011 0415   PROT 5.0* 11/03/2011 0310   ALBUMIN 2.2* 11/03/2011 0310   AST 20 11/03/2011 0310   ALT 10 11/03/2011 0310   ALKPHOS 65 11/03/2011 0310   BILITOT 0.5 11/03/2011 0310   GFRNONAA 31* 11/05/2011 0415   GFRAA 35* 11/05/2011 0415   CBG (last 3)   Basename 11/05/11 1137 11/05/11 0811 11/05/11 0417  GLUCAP 134* 100* 108*      Intake/Output Summary (Last 24 hours) at 11/05/11 1229 Last data filed at 11/05/11 1200  Gross per 24 hour  Intake 1663.57 ml  Output   6080 ml  Net -4416.43 ml  BM on 6/25  Ht: 6' 0.83" (185 cm)  Weight Status:  97.2 kg, wt stable (up 0.2 kg x 2 days)  Body mass index is 28.40 kg/(m^2). Overweight  Re-estimated needs:  2000 - 2100 kcal, 100 - 110 grams protein, 1.9 - 2 liters fluid daily  Nutrition Dx:  Inadequate oral intake r/t inability to eat AEB NPO status - ongoing.  Goal:  EN to meet >90% of estimated nutrition needs. Unmet 2/2 TF being held.  Intervention:   1. Resume TF as medically able with the following recommended changes:  Recommend post-pyloric feeding tube tip placement  Recommend prokinetic agent to help with gastric motility  Initiate Osmolite 1.2 at 15 ml/hr, advance by 10 ml/hr every 4 hours or as tolerated, to goal of 65 ml/hr. 30 ml Prostat liquid protein via tube BID. Total regimen will provide: 2072 kcal, 117 grams  protein, 1279 ml free water  Monitor:  EN regimen, respiratory status, weight, labs, I/O's  Jarold Motto MS, RD, LDN Pager#: [phone]

## 2011-11-05 NOTE — Progress Notes (Addendum)
VASCULAR AND VEIN SURGERY POST - OP CEA PROGRESS NOTE  Date of Surgery: 10/31/2011 Surgeon: Surgeon(s): Nada Libman, MD Delight Ovens, MD 5 Days Post-Op right Carotid Endarterectomy .  HPI: Nathan [name] is a 71 y.o. male who is 5 Days Post-Op right Carotid Endarterectomy . Patient is doing well.  Still intubated, follows commands to move all four extremities.    Significant Diagnostic Studies: CBC Lab Results  Component Value Date   WBC 8.8 11/05/2011   HGB 8.6* 11/05/2011   HCT 25.6* 11/05/2011   MCV 87.4 11/05/2011   PLT 132* 11/05/2011    BMET    Component Value Date/Time   NA 141 11/05/2011 0415   K 3.7 11/05/2011 0415   CL 101 11/05/2011 0415   CO2 29 11/05/2011 0415   GLUCOSE 110* 11/05/2011 0415   BUN 45* 11/05/2011 0415   CREATININE 2.06* 11/05/2011 0415   CREATININE 1.58* 09/18/2011 1224   CALCIUM 8.8 11/05/2011 0415   GFRNONAA 31* 11/05/2011 0415   GFRAA 35* 11/05/2011 0415    COAG Lab Results  Component Value Date   INR 1.38 11/03/2011   INR 1.57* 10/31/2011   INR 1.02 10/29/2011   No results found for this basename: PTT      Intake/Output Summary (Last 24 hours) at 11/05/11 0719 Last data filed at 11/05/11 0700  Gross per 24 hour  Intake 1776.37 ml  Output   5950 ml  Net -4173.63 ml    Physical Exam:  BP Readings from Last 3 Encounters:  11/05/11 165/64  11/05/11 165/64  10/29/11 134/68   Temp Readings from Last 3 Encounters:  11/05/11 97.9 F (36.6 C) Oral  11/05/11 97.9 F (36.6 C) Oral  10/29/11 96.9 F (36.1 C) Oral   SpO2 Readings from Last 3 Encounters:  11/05/11 98%  11/05/11 98%  10/29/11 97%   Pulse Readings from Last 3 Encounters:  11/05/11 83  11/05/11 83  10/29/11 57    Moving all four extremities Alert Incision C/D/I  Assessment: Nathan [name] is a 71 y.o. male is S/P Right Carotid endarterectomy  DIAGNOSTIC STUDIES: Endarterectomy patent. No evidence of thrombus, intimal flap or significant stenosis.  Mild heterogeneous plaque in the distal CCA.    Plan: Continue to wean vent.  Hopefully extubate soon.  Appears neurologically intact.  Doppler study normal.     Clinton Gallant [facility] [phone] 11/05/2011 7:19 AM   Agree with above   Durene Cal

## 2011-11-06 ENCOUNTER — Inpatient Hospital Stay (HOSPITAL_COMMUNITY): Payer: Medicare PPO

## 2011-11-06 DIAGNOSIS — I10 Essential (primary) hypertension: Secondary | ICD-10-CM

## 2011-11-06 LAB — BASIC METABOLIC PANEL
BUN: 42 mg/dL — ABNORMAL HIGH (ref 6–23)
CO2: 27 mEq/L (ref 19–32)
CO2: 31 mEq/L (ref 19–32)
Calcium: 7.6 mg/dL — ABNORMAL LOW (ref 8.4–10.5)
Calcium: 8.9 mg/dL (ref 8.4–10.5)
Chloride: 98 mEq/L (ref 96–112)
Chloride: 100 mEq/L (ref 96–112)
Creatinine, Ser: 0.51 mg/dL (ref 0.50–1.35)
GFR calc Af Amer: >90 mL/min (ref >90)
GFR calc non Af Amer: >90 mL/min (ref >90)
Glucose, Bld: 326 mg/dL — ABNORMAL HIGH (ref 70–99)
Glucose, Bld: 101 mg/dL — ABNORMAL HIGH (ref 70–99)
Potassium: 3.1 mEq/L — ABNORMAL LOW (ref 3.5–5.1)
Potassium: 3.6 mEq/L (ref 3.5–5.1)
Sodium: 138 mEq/L (ref 135–145)
Sodium: 143 mEq/L (ref 135–145)

## 2011-11-06 LAB — GLUCOSE, CAPILLARY
Glucose-Capillary: 105 mg/dL — ABNORMAL HIGH (ref 70–99)
Glucose-Capillary: 109 mg/dL — ABNORMAL HIGH (ref 70–99)
Glucose-Capillary: 118 mg/dL — ABNORMAL HIGH (ref 70–99)
Glucose-Capillary: 53 mg/dL — ABNORMAL LOW (ref 70–99)
Glucose-Capillary: 79 mg/dL (ref 70–99)
Glucose-Capillary: 83 mg/dL (ref 70–99)
Glucose-Capillary: 89 mg/dL (ref 70–99)

## 2011-11-06 LAB — CBC
HCT: 21.4 % — ABNORMAL LOW (ref 39.0–52.0)
HCT: 25.2 % — ABNORMAL LOW (ref 39.0–52.0)
Hemoglobin: 7.2 g/dL — ABNORMAL LOW (ref 13.0–17.0)
Hemoglobin: 8.4 g/dL — ABNORMAL LOW (ref 13.0–17.0)
MCH: 29.6 pg (ref 26.0–34.0)
MCH: 29.6 pg (ref 26.0–34.0)
MCHC: 33.6 g/dL (ref 30.0–36.0)
MCV: 88.1 fL (ref 78.0–100.0)
MCV: 88.7 fL (ref 78.0–100.0)
Platelets: 136 10*3/uL — ABNORMAL LOW (ref 150–400)
RBC: 2.43 MIL/uL — ABNORMAL LOW (ref 4.22–5.81)
RBC: 2.84 MIL/uL — ABNORMAL LOW (ref 4.22–5.81)
RDW: 15.9 % — ABNORMAL HIGH (ref 11.5–15.5)
WBC: 6.7 10*3/uL (ref 4.0–10.5)

## 2011-11-06 LAB — BLOOD GAS, ARTERIAL
Bicarbonate: 30.5 mEq/L — ABNORMAL HIGH (ref 20.0–24.0)
Drawn by: 10006
PEEP: 5 cmH2O
Patient temperature: 98.9
pH, Arterial: 7.467 — ABNORMAL HIGH (ref 7.350–7.450)

## 2011-11-06 LAB — CULTURE, RESPIRATORY W GRAM STAIN: Gram Stain: NONE SEEN

## 2011-11-06 LAB — MAGNESIUM: Magnesium: 1.9 mg/dL (ref 1.5–2.5)

## 2011-11-06 MED ORDER — HALOPERIDOL LACTATE 5 MG/ML IJ SOLN
1.0000 mg | Freq: Four times a day (QID) | INTRAMUSCULAR | Status: DC | PRN
  Administered 2011-11-06: 3 mg via INTRAVENOUS
  Filled 2011-11-06: qty 1

## 2011-11-06 MED ORDER — HALOPERIDOL LACTATE 5 MG/ML IJ SOLN
INTRAMUSCULAR | Status: AC
  Administered 2011-11-06: 3 mg
  Filled 2011-11-06: qty 1

## 2011-11-06 MED ORDER — POTASSIUM CHLORIDE 10 MEQ/50ML IV SOLN
10.0000 meq | INTRAVENOUS | Status: AC
  Administered 2011-11-06 (×2): 10 meq via INTRAVENOUS

## 2011-11-06 MED ORDER — POTASSIUM CHLORIDE 10 MEQ/50ML IV SOLN
10.0000 meq | INTRAVENOUS | Status: AC
  Administered 2011-11-06: 10 meq via INTRAVENOUS

## 2011-11-06 MED ORDER — FUROSEMIDE 10 MG/ML IJ SOLN
20.0000 mg | Freq: Three times a day (TID) | INTRAMUSCULAR | Status: AC
Start: 2011-11-06 — End: 2011-11-06
  Administered 2011-11-06 (×2): 20 mg via INTRAVENOUS
  Filled 2011-11-06 (×2): qty 2

## 2011-11-06 MED ORDER — POTASSIUM CHLORIDE 10 MEQ/50ML IV SOLN
INTRAVENOUS | Status: AC
  Filled 2011-11-06: qty 150

## 2011-11-06 NOTE — Progress Notes (Signed)
Patient ID: Nathan [name], male   DOB: 04-10-1941, 71 y.o.   MRN: [id number] TCTS DAILY PROGRESS NOTE                   [address]            [place] [address]          [phone]      6 Days Post-Op Procedure(s) (LRB): CORONARY ARTERY BYPASS GRAFTING (CABG) (N/A) ENDARTERECTOMY CAROTID (Right)  Total Length of Stay:  LOS: 6 days   Subjective: Much more alert today fully follows complex commands  Objective: Vital signs in last 24 hours: Temp:  [98.7 F (37.1 C)-100.1 F (37.8 C)] 99.1 F (37.3 C) (06/27 0812) Pulse Rate:  [62-100] 84  (06/27 0800) Cardiac Rhythm:  [-] Normal sinus rhythm (06/27 0800) Resp:  [11-22] 19  (06/27 0800) BP: (91-178)/(43-79) 160/61 mmHg (06/27 0800) SpO2:  [98 %-100 %] 100 % (06/27 0800) FiO2 (%):  [40 %-40.8 %] 40.4 % (06/27 0800) Weight:  [213 lb 10 oz (96.9 kg)] 213 lb 10 oz (96.9 kg) (06/27 0400)  Filed Weights   11/04/11 0500 11/05/11 0400 11/06/11 0400  Weight: 216 lb 0.8 oz (98 kg) 214 lb 4.6 oz (97.2 kg) 213 lb 10 oz (96.9 kg)    Weight change: -10.6 oz (-0.3 kg)   Hemodynamic parameters for last 24 hours:    Intake/Output from previous day: 06/26 0701 - 06/27 0700 In: 1690.5 [I.V.:1132.5; NG/GT:90; IV Piggyback:468] Out: 2130 [Urine:2995; Emesis/NG output:900]  Intake/Output this shift: Total I/O In: 96.6 [I.V.:96.6] Out: -   Current Meds: Scheduled Meds:   . acetaminophen  1,000 mg Oral Q6H   Or  . acetaminophen (TYLENOL) oral liquid 160 mg/5 mL  975 mg Per Tube Q6H  . allopurinol  300 mg Oral Daily  . antiseptic oral rinse  15 mL Mouth Rinse QID  . aspirin EC  325 mg Oral Daily   Or  . aspirin  324 mg Per Tube Daily  . bisacodyl  10 mg Oral Daily   Or  . bisacodyl  10 mg Rectal Daily  . budesonide  0.25 mg Nebulization Q6H  . buPROPion  150 mg Oral BID  . chlorhexidine  15 mL Mouth Rinse BID  . docusate  200 mg Per Tube Daily  . enoxaparin (LOVENOX) injection  30 mg Subcutaneous Q24H    . furosemide  20 mg Intravenous Q8H  . furosemide  40 mg Intravenous Once  . insulin aspart  0-24 Units Subcutaneous Q4H  . insulin regular  0-10 Units Intravenous TID WC  . levalbuterol  0.63 mg Nebulization Q6H  . metoprolol tartrate  12.5 mg Oral BID   Or  . metoprolol tartrate  12.5 mg Per Tube BID  . pantoprazole (PROTONIX) IV  40 mg Intravenous QHS  . piperacillin-tazobactam (ZOSYN)  IV  3.375 g Intravenous Q8H  . potassium chloride  10 mEq Intravenous Q1 Hr x 2  . potassium chloride  40 mEq Per Tube TID  . simvastatin  10 mg Oral QHS  . sodium chloride  3 mL Intravenous Q12H  . vancomycin  1,250 mg Intravenous Q24H  . vancomycin  250 mg Intravenous NOW  . DISCONTD: vancomycin  1,000 mg Intravenous Q24H   Continuous Infusions:   . sodium chloride 20 mL/hr at 11/06/11 0800  . amiodarone (NEXTERONE PREMIX) 360 mg/200 mL dextrose 30 mg/hr (11/06/11 0800)  . dexmedetomidine (PRECEDEX) IV infusion for high rates 1  mcg/kg/hr (11/06/11 0800)  . DOPamine 2.5 mcg/kg/min (11/06/11 0800)  . feeding supplement (OSMOLITE 1.2 CAL) 1,000 mL (11/03/11 2000)  . insulin (NOVOLIN-R) infusion Stopped (10/31/11 2005)  . lactated ringers 20 mL/hr at 11/01/11 0700  . DISCONTD: dexmedetomidine (PRECEDEX) IV infusion 0.7 mcg/kg/hr (11/05/11 1400)   PRN Meds:.dextrose, fentaNYL, metoprolol, midazolam, morphine injection, ondansetron (ZOFRAN) IV, oxyCODONE, sodium chloride  General appearance: alert, cooperative and no distress Neurologic: intact Heart: regular rate and rhythm, S1, S2 normal, no murmur, click, rub or gallop and normal apical impulse Lungs: clear to auscultation bilaterally and normal percussion bilaterally Abdomen: soft, non-tender; bowel sounds normal; no masses,  no organomegaly Extremities: extremities normal, atraumatic, no cyanosis or edema and Homans sign is negative, no sign of DVT Wound: intact  Lab Results: CBC: Basename 11/06/11 0641 11/06/11 0400  WBC 7.7 6.7  HGB  8.4* 7.2*  HCT 25.2* 21.4*  PLT 159 136*   BMET:  Basename 11/06/11 0400 11/05/11 0415  NA 138 141  K 3.1* 3.7  CL 98 101  CO2 27 29  GLUCOSE 326* 110*  BUN 42* 45*  CREATININE 0.51 2.06*  CALCIUM 7.6* 8.8    Labs are wrong this am repeat pending  PT/INR: No results found for this basename: LABPROT,INR in the last 72 hours Radiology: Dg Chest Port 1 View  11/05/2011  *RADIOLOGY REPORT*  Clinical Data: Coronary bypass, carotid endarterectomy, ventilatory support  PORTABLE CHEST - 1 VIEW  Comparison: 11/04/2011  Findings: Endotracheal tube 4.6 cm above the carina.  Left IJ vascular sheath in the left innominate vein.  NG tube extends below the hemidiaphragm with the tip not visualized.  Coronary bypass changes noted.  Heart remains enlarged with stable diffuse edema pattern and small effusions compatible with CHF.  Slight interval improvement in the left lower lobe aeration.  No pneumothorax.  IMPRESSION: Stable mild CHF pattern.  Improving left lower lobe atelectasis  Original Report Authenticated By: Judie Petit. Ruel Favors, M.D.     Assessment/Plan: S/P Procedure(s) (LRB): CORONARY ARTERY BYPASS GRAFTING (CABG) (N/A) ENDARTERECTOMY CAROTID (Right) wean and extubate today Repeat labs pending    Delight Ovens MD  Beeper [phone] Office [phone] 11/06/2011 8:33 AM

## 2011-11-06 NOTE — Progress Notes (Signed)
VASCULAR AND VEIN SURGERY POST - OP CEA PROGRESS NOTE  Date of Surgery: 10/31/2011 Surgeon: Surgeon(s): Nada Libman, MD Delight Ovens, MD 6 Days Post-Op right Carotid Endarterectomy .  Patient is moving all four extremities.  He is still intubated.  AM labs are being re-drawn secondary to big discrepancies.  HPI: Nathan [name] is a 71 y.o. male who is 6 Days Post-Op right Carotid Endarterectomy . Patient is doing well. IMAGING:  Significant Diagnostic Studies: CBC Lab Results  Component Value Date   WBC 6.7 11/06/2011   HGB 7.2* 11/06/2011   HCT 21.4* 11/06/2011   MCV 88.1 11/06/2011   PLT 136* 11/06/2011    BMET    Component Value Date/Time   NA 138 11/06/2011 0400   K 3.1* 11/06/2011 0400   CL 98 11/06/2011 0400   CO2 27 11/06/2011 0400   GLUCOSE 326* 11/06/2011 0400   BUN 42* 11/06/2011 0400   CREATININE 0.51 11/06/2011 0400   CREATININE 1.58* 09/18/2011 1224   CALCIUM 7.6* 11/06/2011 0400   GFRNONAA >90 11/06/2011 0400   GFRAA >90 11/06/2011 0400    COAG Lab Results  Component Value Date   INR 1.38 11/03/2011   INR 1.57* 10/31/2011   INR 1.02 10/29/2011   No results found for this basename: PTT      Intake/Output Summary (Last 24 hours) at 11/06/11 0702 Last data filed at 11/06/11 0600  Gross per 24 hour  Intake 1690.46 ml  Output   3895 ml  Net -2204.54 ml    Physical Exam:  BP Readings from Last 3 Encounters:  11/06/11 134/56  11/06/11 134/56  10/29/11 134/68   Temp Readings from Last 3 Encounters:  11/06/11 98.9 F (37.2 C) Oral  11/06/11 98.9 F (37.2 C) Oral  10/29/11 96.9 F (36.1 C) Oral   SpO2 Readings from Last 3 Encounters:  11/06/11 100%  11/06/11 100%  10/29/11 97%   Pulse Readings from Last 3 Encounters:  11/06/11 78  11/06/11 78  10/29/11 57    Pt is Alert Moving all extremities. Palpable radial pulses equal. Right carotid incision clean dry and intact.    Assessment: Nathan [name] is a 71 y.o. male is S/P Right  Carotid endarterectomy Pt is voiding, ambulating and taking po well. Pending extubation.   Plan: Discharge to: Home when stable. Follow-up in 4 weeks   Clinton Gallant [facility] [phone] 11/06/2011 7:02 AM

## 2011-11-06 NOTE — Progress Notes (Signed)
Now extubated and in NSR. Confused, but making progress.

## 2011-11-06 NOTE — Progress Notes (Signed)
PULMONARY/CCM PROGRESS NOTE  Requesting MD/Service: Tyrone Sage Date of admission: 6/21 Date of consult: 2022-11-30 Reason for consultation: post operative respiratory failure, s/p re-intubation 2022-11-30  Pt Profile: 70 yowm recalcitrant smoker who underwent elective CABG and R CEA 6/21. Extubated per TCTS protocol post operatively but developed acute respiratory distress with severe hypoxemia and purulent respiratory secretions. Re-intubated 11/30/22.  Lines, Tubes, etc: ETT 6/21 >> 30-Nov-2022 L IJ CVL 6/21 >> Radial A-line 6/21 >>  ETT Nov 30, 2022 >>   Microbiology: Resp Nov 30, 2022 >> GPR but determined normal flora Blood 6/25>>>NTD Urine 6/25>>>NTD  Antibiotics:  Cefuroxime 6/21 >> 30-Nov-2022 Ceftriaxone 6/23 >> 6/25 Vanc 6/25>>>6/27 Zosyn 6/25>>>  Studies/Events: 6/21 CABG, R CEA 30-Nov-2022 reintubated for ineffective cough and airway secretions 6/23 AFRVR  Consults:  TCTS primary PCCM  Best Practice: DVT: LMWH SUP: PPI Nutrition: None Glycemic control: SSI  Sedation/analgesia: cont sedation protocol  Filed Vitals:   11/06/11 0800 11/06/11 0812 11/06/11 0857 11/06/11 0900  BP: 160/61   142/55  Pulse: 84   80  Temp:  99.1 F (37.3 C)    TempSrc:  Oral    Resp: 19   16  Height:      Weight:      SpO2: 100%  100% 100%   EXAM:  Gen: sedated, intubated but improving. HEENT: WNL Neck: JVP not visualized, s/p R CEA Lungs: diffuse rhonchi, diminished BS in R base Cardiovascular: RRR with extrasystoles, no M heard Abdomen: soft, NT, NABS Ext: No edema Neuro: MAEs, CNs intact  Intake/Output Summary (Last 24 hours) at 11/06/11 0932 Last data filed at 11/06/11 0900  Gross per 24 hour  Intake 1716.06 ml  Output   3725 ml  Net -2008.94 ml   DATA:  BMET    Component Value Date/Time   NA 143 11/06/2011 0641   K 3.6 11/06/2011 0641   CL 100 11/06/2011 0641   CO2 31 11/06/2011 0641   GLUCOSE 101* 11/06/2011 0641   BUN 49* 11/06/2011 0641   CREATININE 2.32* 11/06/2011 0641   CREATININE 1.58* 09/18/2011  1224   CALCIUM 8.9 11/06/2011 0641   GFRNONAA 26* 11/06/2011 0641   GFRAA 31* 11/06/2011 0641   CBC    Component Value Date/Time   WBC 7.7 11/06/2011 0641   RBC 2.84* 11/06/2011 0641   HGB 8.4* 11/06/2011 0641   HCT 25.2* 11/06/2011 0641   PLT 159 11/06/2011 0641   MCV 88.7 11/06/2011 0641   MCH 29.6 11/06/2011 0641   MCHC 33.3 11/06/2011 0641   RDW 15.9* 11/06/2011 0641   CXR: bibasilar atx/infiltrates  IMPRESSION:   Principal Problem:  *Respiratory failure, post-operative Active Problems:  COPD (chronic obstructive pulmonary disease)  Occlusion and stenosis of carotid artery without mention of cerebral infarction  Purulent bronchitis  S/P CABG (coronary artery bypass graft)  Atrial fibrillation with RVR  Acute renal failure  71 year old male s/p VDRF post CABG.  Was extubated on 6/21 to be reintubated on November 30, 2022 for purulent bronchitis.   PLAN:  Mental status is improved (arousable and following commands) but very agitated. Secretions much improved. Continue precedex but decrease dosing once extubated to control agitation. Extubate. Titrate o2 for sat of 88-92%. Cont nebulized BDs. Continue zosyn/vanc, if all cultures are negative by AM (72 hrs) will d/c all abx. Blood and urine cultures on 6/25 NTD, sputum is negative on 2022-11-30 but 6/25 with GPR determined to be normal flora. Amiodarone initiated per TCTS. No TF due to residuals, will extubate and check swallow evaluation in AM.  KVO IVF and monitor Cr as renal function seems to be slowly improving. Dopamine at 2.5 mcg per TCTS, would recommend d/c by AM after additional diureses. Continue lasix, patient is grossly fluid overloaded but improving. Electrolyte replacement as indicated. Haldol for agitation, if resolved will d/c Precedex. PT/OT. OOB to chair.  35 mins CCM time  Alyson Reedy, M.D. [facility]. Pager: [phone]. After hours pager: [phone].

## 2011-11-06 NOTE — Progress Notes (Signed)
Patient ID: Nathan [name], male   DOB: 09-01-1940, 71 y.o.   MRN: [id number]   Filed Vitals:   11/06/11 1800 11/06/11 1900 11/06/11 1939 11/06/11 1951  BP: 135/68     Pulse: 91 89    Temp:    99.3 F (37.4 C)  TempSrc:    Oral  Resp: 19 18    Height:      Weight:      SpO2: 100% 100% 100%    Urine output ok  Extubated and alert, some delirium.  CBC    Component Value Date/Time   WBC 7.7 11/06/2011 0641   RBC 2.84* 11/06/2011 0641   HGB 8.4* 11/06/2011 0641   HCT 25.2* 11/06/2011 0641   PLT 159 11/06/2011 0641   MCV 88.7 11/06/2011 0641   MCH 29.6 11/06/2011 0641   MCHC 33.3 11/06/2011 0641   RDW 15.9* 11/06/2011 0641    BMET    Component Value Date/Time   NA 143 11/06/2011 0641   K 3.6 11/06/2011 0641   CL 100 11/06/2011 0641   CO2 31 11/06/2011 0641   GLUCOSE 101* 11/06/2011 0641   BUN 49* 11/06/2011 0641   CREATININE 2.32* 11/06/2011 0641   CREATININE 1.58* 09/18/2011 1224   CALCIUM 8.9 11/06/2011 0641   GFRNONAA 26* 11/06/2011 0641   GFRAA 31* 11/06/2011 0641    A/P: stable.  Continue present course.  H/O heavy ETOH use.

## 2011-11-07 ENCOUNTER — Inpatient Hospital Stay (HOSPITAL_COMMUNITY): Payer: Medicare PPO

## 2011-11-07 ENCOUNTER — Encounter (HOSPITAL_COMMUNITY): Payer: Self-pay | Admitting: Cardiothoracic Surgery

## 2011-11-07 LAB — GLUCOSE, CAPILLARY
Glucose-Capillary: 110 mg/dL — ABNORMAL HIGH (ref 70–99)
Glucose-Capillary: 121 mg/dL — ABNORMAL HIGH (ref 70–99)
Glucose-Capillary: 154 mg/dL — ABNORMAL HIGH (ref 70–99)
Glucose-Capillary: 92 mg/dL (ref 70–99)
Glucose-Capillary: 97 mg/dL (ref 70–99)
Glucose-Capillary: 99 mg/dL (ref 70–99)

## 2011-11-07 LAB — CBC
HCT: 28.1 % — ABNORMAL LOW (ref 39.0–52.0)
Hemoglobin: 9.1 g/dL — ABNORMAL LOW (ref 13.0–17.0)
MCH: 28.9 pg (ref 26.0–34.0)
MCHC: 32.4 g/dL (ref 30.0–36.0)
MCV: 89.2 fL (ref 78.0–100.0)
Platelets: 196 10*3/uL (ref 150–400)
RBC: 3.15 MIL/uL — ABNORMAL LOW (ref 4.22–5.81)
RDW: 16.1 % — ABNORMAL HIGH (ref 11.5–15.5)
WBC: 7.7 10*3/uL (ref 4.0–10.5)

## 2011-11-07 LAB — BASIC METABOLIC PANEL
BUN: 43 mg/dL — ABNORMAL HIGH (ref 6–23)
CO2: 29 mEq/L (ref 19–32)
Calcium: 9 mg/dL (ref 8.4–10.5)
Chloride: 99 mEq/L (ref 96–112)
Creatinine, Ser: 1.57 mg/dL — ABNORMAL HIGH (ref 0.50–1.35)
GFR calc Af Amer: 49 mL/min — ABNORMAL LOW (ref >90)
GFR calc non Af Amer: 42 mL/min — ABNORMAL LOW (ref >90)
Glucose, Bld: 133 mg/dL — ABNORMAL HIGH (ref 70–99)
Potassium: 3.4 mEq/L — ABNORMAL LOW (ref 3.5–5.1)
Sodium: 144 mEq/L (ref 135–145)

## 2011-11-07 LAB — PHOSPHORUS: Phosphorus: 3.7 mg/dL (ref 2.3–4.6)

## 2011-11-07 LAB — MAGNESIUM: Magnesium: 2.4 mg/dL (ref 1.5–2.5)

## 2011-11-07 MED ORDER — POTASSIUM CHLORIDE 10 MEQ/50ML IV SOLN
INTRAVENOUS | Status: AC
  Filled 2011-11-07: qty 150

## 2011-11-07 MED ORDER — POTASSIUM CHLORIDE 10 MEQ/50ML IV SOLN
10.0000 meq | INTRAVENOUS | Status: AC | PRN
  Administered 2011-11-07 – 2011-11-10 (×3): 10 meq via INTRAVENOUS
  Filled 2011-11-07: qty 50

## 2011-11-07 MED ORDER — MORPHINE SULFATE 2 MG/ML IJ SOLN
2.0000 mg | INTRAMUSCULAR | Status: DC | PRN
  Administered 2011-11-08: 2 mg via INTRAVENOUS
  Administered 2011-11-08: 1 mg via INTRAVENOUS
  Administered 2011-11-08 – 2011-11-16 (×18): 2 mg via INTRAVENOUS
  Filled 2011-11-07 (×20): qty 1

## 2011-11-07 MED ORDER — AMIODARONE HCL 200 MG PO TABS
200.0000 mg | ORAL_TABLET | Freq: Two times a day (BID) | ORAL | Status: DC
  Administered 2011-11-07: 200 mg via ORAL
  Filled 2011-11-07 (×4): qty 1

## 2011-11-07 MED ORDER — SODIUM CHLORIDE 0.45 % IV SOLN
INTRAVENOUS | Status: DC
  Administered 2011-11-07 – 2011-11-08 (×3): via INTRAVENOUS

## 2011-11-07 MED ORDER — JEVITY 1.2 CAL PO LIQD
1000.0000 mL | ORAL | Status: DC
  Administered 2011-11-07: 1000 mL
  Filled 2011-11-07 (×4): qty 1000

## 2011-11-07 MED ORDER — METOPROLOL TARTRATE 25 MG PO TABS
25.0000 mg | ORAL_TABLET | Freq: Two times a day (BID) | ORAL | Status: DC
  Administered 2011-11-07: 25 mg via ORAL
  Filled 2011-11-07 (×2): qty 1

## 2011-11-07 MED ORDER — METOPROLOL TARTRATE 1 MG/ML IV SOLN
5.0000 mg | Freq: Four times a day (QID) | INTRAVENOUS | Status: DC
  Administered 2011-11-08 – 2011-11-09 (×6): 5 mg via INTRAVENOUS
  Filled 2011-11-07 (×10): qty 5

## 2011-11-07 NOTE — Progress Notes (Signed)
Patient ID: Nathan [name], male   DOB: 04/05/1941, 71 y.o.   MRN: [id number] TCTS DAILY PROGRESS NOTE                   [address]            [place] [address]          [phone]      7 Days Post-Op Procedure(s) (LRB): CORONARY ARTERY BYPASS GRAFTING (CABG) (N/A) ENDARTERECTOMY CAROTID (Right)  Total Length of Stay:  LOS: 7 days   Subjective: Stable off vent  Objective: Vital signs in last 24 hours: Temp:  [98.7 F (37.1 C)-99.3 F (37.4 C)] 99.2 F (37.3 C) (06/28 1207) Pulse Rate:  [79-104] 85  (06/28 1200) Cardiac Rhythm:  [-] Normal sinus rhythm (06/28 1200) Resp:  [11-29] 21  (06/28 1200) BP: (131-177)/(53-70) 170/69 mmHg (06/28 1200) SpO2:  [92 %-100 %] 100 % (06/28 1200) Weight:  [212 lb 1.3 oz (96.2 kg)] 212 lb 1.3 oz (96.2 kg) (06/28 0500)  Filed Weights   11/05/11 0400 11/06/11 0400 11/07/11 0500  Weight: 214 lb 4.6 oz (97.2 kg) 213 lb 10 oz (96.9 kg) 212 lb 1.3 oz (96.2 kg)    Weight change: -1 lb 8.7 oz (-0.7 kg)   Hemodynamic parameters for last 24 hours:    Intake/Output from previous day: 06/27 0701 - 06/28 0700 In: 1363.6 [I.V.:949.6; IV Piggyback:414] Out: 2995 [Urine:2995]  Intake/Output this shift: Total I/O In: 401.3 [P.O.:360; I.V.:41.3] Out: 475 [Urine:475]  Current Meds: Scheduled Meds:   . allopurinol  300 mg Oral Daily  . amiodarone  200 mg Oral BID  . antiseptic oral rinse  15 mL Mouth Rinse QID  . aspirin EC  325 mg Oral Daily   Or  . aspirin  324 mg Per Tube Daily  . bisacodyl  10 mg Oral Daily   Or  . bisacodyl  10 mg Rectal Daily  . budesonide  0.25 mg Nebulization Q6H  . buPROPion  150 mg Oral BID  . chlorhexidine  15 mL Mouth Rinse BID  . docusate  200 mg Per Tube Daily  . enoxaparin (LOVENOX) injection  30 mg Subcutaneous Q24H  . furosemide  20 mg Intravenous Q8H  . insulin aspart  0-24 Units Subcutaneous Q4H  . insulin regular  0-10 Units Intravenous TID WC  . levalbuterol  0.63 mg  Nebulization Q6H  . metoprolol tartrate  25 mg Oral BID  . pantoprazole (PROTONIX) IV  40 mg Intravenous QHS  . potassium chloride  10 mEq Intravenous Q1 Hr x 3  . potassium chloride  10 mEq Intravenous Q1 Hr x 2  . potassium chloride      . simvastatin  10 mg Oral QHS  . sodium chloride  3 mL Intravenous Q12H  . DISCONTD: metoprolol tartrate  12.5 mg Per Tube BID  . DISCONTD: metoprolol tartrate  12.5 mg Oral BID  . DISCONTD: piperacillin-tazobactam (ZOSYN)  IV  3.375 g Intravenous Q8H   Continuous Infusions:   . sodium chloride 20 mL/hr at 11/07/11 0600  . feeding supplement (OSMOLITE 1.2 CAL) 1,000 mL (11/03/11 2000)  . insulin (NOVOLIN-R) infusion Stopped (10/31/11 2005)  . DISCONTD: amiodarone (NEXTERONE PREMIX) 360 mg/200 mL dextrose 30 mg/hr (11/07/11 0600)  . DISCONTD: dexmedetomidine (PRECEDEX) IV infusion for high rates Stopped (11/06/11 1300)  . DISCONTD: DOPamine 2.5 mcg/kg/min (11/07/11 0600)  . DISCONTD: lactated ringers 20 mL/hr at 11/01/11 0700   PRN Meds:.fentaNYL, haloperidol lactate, metoprolol,  ondansetron (ZOFRAN) IV, oxyCODONE, potassium chloride, sodium chloride, DISCONTD: midazolam, DISCONTD:  morphine injection  General appearance: alert, cooperative, appears older than stated age and no distress Neurologic: intact Heart: regular rate and rhythm, S1, S2 normal, no murmur, click, rub or gallop and normal apical impulse Lungs: diminished breath sounds bibasilar Abdomen: soft, non-tender; bowel sounds normal; no masses,  no organomegaly Extremities: extremities normal, atraumatic, no cyanosis or edema and Homans sign is negative, no sign of DVT Wound: intact  Lab Results: CBC: Basename 11/07/11 0400 11/06/11 0641  WBC 7.7 7.7  HGB 9.1* 8.4*  HCT 28.1* 25.2*  PLT 196 159   BMET:  Basename 11/07/11 0400 11/06/11 0641  NA 144 143  K 3.4* 3.6  CL 99 100  CO2 29 31  GLUCOSE 133* 101*  BUN 43* 49*  CREATININE 1.57* 2.32*  CALCIUM 9.0 8.9    PT/INR:  No results found for this basename: LABPROT,INR in the last 72 hours Radiology: Dg Chest Port 1 View  11/07/2011  *RADIOLOGY REPORT*  Clinical Data: Chest tube.  Coronary artery disease.  CABG.  PORTABLE CHEST - 1 VIEW  Comparison: 1 day prior  Findings: Left IJ Cordis sheath is unchanged.  Extubation and removal of nasogastric tube.  Mild cardiomegaly.  Left-sided pleural effusion is slightly increased. No pneumothorax.  Mild pulmonary venous congestion is shifting, greater left than right.  Worsened left base aeration with increased airspace disease.  IMPRESSION:  1.  Shifting mild asymmetric pulmonary venous congestion. 2.  Extubation and removal of nasogastric tube. 3.  Increased left pleural effusion and left base air space disease.  Original Report Authenticated By: Consuello Bossier, M.D.   Dg Chest Port 1 View  11/06/2011  *RADIOLOGY REPORT*  Clinical Data: Coronary artery disease.  Endotracheal tube.  PORTABLE CHEST - 1 VIEW  Comparison: 1 day prior  Findings: Endotracheal tube terminates at the level of clavicles. Nasogastric tube extends beyond the  inferior aspect of the film. Prior median sternotomy.  Hyperinflation.  Mild cardiomegaly. Suspect small left pleural effusion.  Left IJ Cordis sheath unchanged.  No pneumothorax.  Improved pulmonary venous congestion/interstitial edema.  Mild left base atelectasis.  IMPRESSION:  1.  Improvement in interstitial edema/pulmonary venous congestion. 2.  Similar left base atelectasis with possible small left pleural effusion.  Original Report Authenticated By: Consuello Bossier, M.D.     Assessment/Plan: S/P Procedure(s) (LRB): CORONARY ARTERY BYPASS GRAFTING (CABG) (N/A) ENDARTERECTOMY CAROTID (Right) Renal function back to base line Will need pt to progress  swallowing eval today     Delight Ovens MD  Beeper [phone] Office [phone] 11/07/2011 1:03 PM

## 2011-11-07 NOTE — Procedures (Signed)
Objective Swallowing Evaluation: Modified Barium Swallowing Study  Patient Details  Name: Nathan [name] MRN: [id number] Date of Birth: 1940-07-13  Today's Date: 11/07/2011 Time: 4098-1191 SLP Time Calculation (min): 15 min  Past Medical History:  Past Medical History  Diagnosis Date  . Aorto-iliac disease     bilat with stent 2007  . CAD (coronary artery disease)     with loop to the circumflex 2000, inferolateral infarction  . Hypertension   . Paroxysmal atrial flutter     ablation  . ED (erectile dysfunction)   . COPD (chronic obstructive pulmonary disease)   . Hyperlipidemia   . Stroke   . Shortness of breath   . GERD (gastroesophageal reflux disease)   . Gout    Past Surgical History:  Past Surgical History  Procedure Date  . Spinal fusion   . Hand surgery   . Neck fusion 1975  . Eye surgery   . Coronary artery bypass graft 10/31/2011    Procedure: CORONARY ARTERY BYPASS GRAFTING (CABG);  Surgeon: Delight Ovens, MD;  Location[facility] OR;  Service: Open Heart Surgery;  Laterality: N/A;  times three using  Left Internal Mammary Artery and Right Greater Saphenous Vein Graft harvested endoscopically; TEE  . Endarterectomy 10/31/2011    Procedure: ENDARTERECTOMY CAROTID;  Surgeon: Nada Libman, MD;  Location: [facility] OR;  Service: Vascular;  Laterality: Right;  with patch angioplasty    HPI:  71 yr old who underwent right carotid endarterectomy and CBAG 6/21.  Extubated after surgery experienced respiratory distress was re-intubated and extubated 6/27.  CXR 6/27 with pulmonary venous congestion and increased plural effusion.  Esophagram 12/08/00 revealed large amount of GERD and small Zenker's diverticulum.  Clinical indicators of aspiration during bedside swallow with MBS recommended.     Assessment / Plan / Recommendation Clinical Impression  Dysphagia Diagnosis: Moderate oral phase dysphagia;Severe oral phase dysphagia (mod-severe) Clinical impression: Pt. demonstrated  mod-severe sensory-motor based pharyngeal dysphagia with delayed swallow initiation, decreased laryngeal elevation and epiglottic inversion leading to aspiration with nectar and honey thick consistencies (mild throat clear response only).  Puree barium was penetrated to the vocal cords.  Further impacting deficits is a kyphotic cervical curvature widening pharyngeal space with decreased airway protection.  A chin tuck posture was not successful in prevention of barium entrance to airway.  It was also observed that pt. 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  Aspiration risk with any po's is significant at this time with a good prognosis for return to eating/drinking with uncertain timeframe given recent surgeries and multiple intubations.  Recommend pharyngeal strengthening exercises to address muscle weakness, however there are no facilitative methods for decreased sensation.     Treatment Recommendation  Therapy as outlined in treatment plan below    Diet Recommendation NPO;Alternative means - temporary        Other  Recommendations Recommended Consults: MBS Oral Care Recommendations: Oral care BID   Follow Up Recommendations   (to be determined)    Frequency and Duration min 2x/week  2 weeks       SLP Swallow Goals Goal #3: Pt. will perform laryngeal and pharyngeal strengthening exercises with min verbal and visual cues.    General HPI: 71 yr old who underwent right carotid endarterectomy and CBAG 6/21.  Extubated after surgery experienced respiratory distress was re-intubated and extubated 6/27.  CXR 6/27 with pulmonary venous congestion and increased plural effusion.  Esophagram  12/08/00 revealed large amount of GERD and small Zenker's diverticulum.  Clinical indicators of aspiration during bedside swallow with MBS recommended. Type of Study: Modified Barium Swallowing Study Reason for Referral: Objectively evaluate  swallowing function Diet Prior to this Study: Regular;Thin liquids Temperature Spikes Noted: No Respiratory Status: Supplemental O2 delivered via (comment) History of Recent Intubation: Yes Date extubated: 11/06/11 Behavior/Cognition: Alert;Cooperative;Pleasant mood Oral Cavity - Dentition: Adequate natural dentition Oral Motor / Sensory Function: Impaired - see Bedside swallow eval Self-Feeding Abilities: Able to feed self Patient Positioning: Upright in chair Baseline Vocal Quality: Clear Volitional Cough: Strong Volitional Swallow: Able to elicit Anatomy:  (khyphotic spine) Pharyngeal Secretions: Not observed secondary MBS    Reason for Referral Objectively evaluate swallowing function   Oral Phase     Pharyngeal Phase Pharyngeal Phase: Impaired   Cervical Esophageal Phase Cervical Esophageal Phase: Leonarda Salon    [name] [employer] [phone]  11/07/2011

## 2011-11-07 NOTE — Evaluation (Signed)
Clinical/Bedside Swallow Evaluation Patient Details  Name: Nathan [name] MRN: [id number] Date of Birth: July 18, 1940  Today's Date: 11/07/2011 Time: 5638-7564 SLP Time Calculation (min): 20 min  Past Medical History:  Past Medical History  Diagnosis Date  . Aorto-iliac disease     bilat with stent 2007  . CAD (coronary artery disease)     with loop to the circumflex 2000, inferolateral infarction  . Hypertension   . Paroxysmal atrial flutter     ablation  . ED (erectile dysfunction)   . COPD (chronic obstructive pulmonary disease)   . Hyperlipidemia   . Stroke   . Shortness of breath   . GERD (gastroesophageal reflux disease)   . Gout    Past Surgical History:  Past Surgical History  Procedure Date  . Spinal fusion   . Hand surgery   . Neck fusion 1975  . Eye surgery   . Coronary artery bypass graft 10/31/2011    Procedure: CORONARY ARTERY BYPASS GRAFTING (CABG);  Surgeon: Delight Ovens, MD;  Location: [facility];  Service: Open Heart Surgery;  Laterality: N/A;  times three using  Left Internal Mammary Artery and Right Greater Saphenous Vein Graft harvested endoscopically; TEE  . Endarterectomy 10/31/2011    Procedure: ENDARTERECTOMY CAROTID;  Surgeon: Nada Libman, MD;  Location: [facility] OR;  Service: Vascular;  Laterality: Right;  with patch angioplasty    HPI:  71 yr old who underwent right carotid endarterectomy and CBAG 6/21.  Extubated after surgery experienced respiratory distress was re-intubated and extubated 6/27.  CXR 6/27 with pulmonary venous congestion and increased plural effusion.  Esophagram 12/08/00 revealed large amount of GERD and small Zenker's diverticulum.  Pt. currently very congested sounding and expectorating copious secretions.   Assessment / Plan / Recommendation Clinical Impression  Pt. exhibiting clinical indicators of aspiration with po's and suspect may be aspirating on his saliva.  Given risk factors of recent CABG and CEA, clinical signs  recommend objective testing with MBS.        Aspiration Risk  Moderate    Diet Recommendation  (will recommend after MBS)        Other  Recommendations Recommended Consults: MBS Oral Care Recommendations: Oral care BID   Follow Up Recommendations   (to be determined)                  Swallow Study Prior Functional Status       General HPI: 71 yr old who underwent right carotid endarterectomy and CBAG 6/21.  Extubated after surgery experienced respiratory distress was re-intubated and extubated 6/27.  CXR 6/27 with pulmonary venous congestion and increased plural effusion.  Esophagram 12/08/00 revealed large amount of GERD and small Zenker's diverticulum.  Pt. currently very congested sounding and expectorating copious secretions. Type of Study: Bedside swallow evaluation Diet Prior to this Study: Regular;Thin liquids Temperature Spikes Noted: No Respiratory Status: Supplemental O2 delivered via (comment) History of Recent Intubation: Yes Date extubated: 11/06/11 Behavior/Cognition: Alert;Cooperative;Pleasant mood Oral Cavity - Dentition: Adequate natural dentition Self-Feeding Abilities: Able to feed self Patient Positioning: Upright in chair Baseline Vocal Quality: Wet Volitional Cough: Strong Volitional Swallow: Able to elicit    Oral/Motor/Sensory Function Overall Oral Motor/Sensory Function: Appears within functional limits for tasks assessed Velum: Within Functional Limits Mandible: Within Functional Limits   Ice Chips Ice chips: Not tested   Thin Liquid Thin Liquid: Impaired Presentation: Cup Pharyngeal  Phase Impairments: Throat Clearing - Immediate (loud, audible swallow)    Nectar Thick Nectar Thick  Liquid: Not tested   Honey Thick Honey Thick Liquid: Not tested   Puree Puree: Impaired Presentation: Spoon;Self Fed Pharyngeal Phase Impairments: Throat Clearing - Delayed (audible, loud swallow)   Solid Solid: Not tested    Royce Macadamia M.Ed  [employer] [phone]  11/07/2011

## 2011-11-07 NOTE — Progress Notes (Addendum)
PULMONARY/CCM PROGRESS NOTE  Requesting MD/Service: Tyrone Sage Date of admission: 6/21 Date of consult: 2022/11/14 Reason for consultation: post operative respiratory failure, s/p re-intubation 2022-11-14  Pt Profile: 70 yowm recalcitrant smoker who underwent elective CABG and R CEA 6/21. Extubated per TCTS protocol post operatively but developed acute respiratory distress with severe hypoxemia and purulent respiratory secretions. Re-intubated 11/14/22.  Lines, Tubes, etc: ETT 6/21 >> 2022-11-14 L IJ CVL 6/21 >> Radial A-line 6/21 >>  ETT 2022/11/14 >>   Microbiology: Resp 2022-11-14 >> GPR but determined normal flora Blood 6/25>>>NTD Urine 6/25>>>NTD  Antibiotics:  Cefuroxime 6/21 >> Nov 14, 2022 Ceftriaxone 6/23 >> 6/25 Vanc 6/25>>>6/27 Zosyn 6/25>>>  Studies/Events: 6/21 CABG, R CEA 2022/11/14 reintubated for ineffective cough and airway secretions 6/23 AFRVR  Consults:  TCTS primary PCCM  Best Practice: DVT: LMWH SUP: PPI Nutrition: None Glycemic control: SSI  Sedation/analgesia: cont sedation protocol  Filed Vitals:   11/07/11 0500 11/07/11 0600 11/07/11 0700 11/07/11 0750  BP: 175/63 159/62 166/63   Pulse: 98 96 94   Temp:    98.7 F (37.1 C)  TempSrc:    Oral  Resp: 29 27 23    Height:      Weight: 96.2 kg (212 lb 1.3 oz)     SpO2: 94% 97% 93%    EXAM:  Gen: Alert but with delirium. HEENT: WNL Neck: JVP not visualized, s/p R CEA Lungs: diffuse rhonchi, diminished BS in R base Cardiovascular: RRR with extrasystoles, no M heard Abdomen: soft, NT, NABS Ext: No edema Neuro: MAEs, CNs intact  Intake/Output Summary (Last 24 hours) at 11/07/11 0835 Last data filed at 11/07/11 0700  Gross per 24 hour  Intake 1266.95 ml  Output   2965 ml  Net -1698.05 ml   DATA:  BMET    Component Value Date/Time   NA 144 11/07/2011 0400   K 3.4* 11/07/2011 0400   CL 99 11/07/2011 0400   CO2 29 11/07/2011 0400   GLUCOSE 133* 11/07/2011 0400   BUN 43* 11/07/2011 0400   CREATININE 1.57* 11/07/2011 0400   CREATININE 1.58* 09/18/2011 1224   CALCIUM 9.0 11/07/2011 0400   GFRNONAA 42* 11/07/2011 0400   GFRAA 49* 11/07/2011 0400   CBC    Component Value Date/Time   WBC 7.7 11/07/2011 0400   RBC 3.15* 11/07/2011 0400   HGB 9.1* 11/07/2011 0400   HCT 28.1* 11/07/2011 0400   PLT 196 11/07/2011 0400   MCV 89.2 11/07/2011 0400   MCH 28.9 11/07/2011 0400   MCHC 32.4 11/07/2011 0400   RDW 16.1* 11/07/2011 0400   CXR: bibasilar atx/infiltrates  IMPRESSION:   Principal Problem:  *Respiratory failure, post-operative Active Problems:  COPD (chronic obstructive pulmonary disease)  Occlusion and stenosis of carotid artery without mention of cerebral infarction  Purulent bronchitis  S/P CABG (coronary artery bypass graft)  Atrial fibrillation with RVR  Acute renal failure  71 year old male s/p VDRF post CABG.  Was extubated on 6/21 to be reintubated on 11-14-2022 for purulent bronchitis.   PLAN:  Mental status much improved. Secretions much improved. D/C precedex and continue PRN haldol. Titrate o2 for sat of 88-92%. Cont nebulized BDs. D/C vanc/zosyn. Blood and urine cultures on 6/25 NTD, sputum is negative on 11-14-2022 but 6/25 with GPR determined to be normal flora. Amiodarone changed to PO. Clear liquid diet and advance as tolerated. Replace K and recheck. Dopamine off. D/C lasix. Electrolyte replacement as indicated. PRN haldol. PT/OT. OOB to chair. Increase lopressor to 25 mg BID with holding parameters.  PCCM signing off, please call back if needed.  Alyson Reedy, M.D. [facility]. Pager: [phone]. After hours pager: [phone].

## 2011-11-07 NOTE — Progress Notes (Addendum)
Nutrition Follow-up  Intervention:   Once [name] tube placed & verified, initiate Jevity 1.2 formula at 15 ml/hr and increase by 10 ml every 4 hours to goal rate of 75 ml/hr to provide 2160 kcals, 100 gm protein, 1453 ml of free water RD to follow for nutrition care plan  Patient extubated 6/27. EN via OGT discontinued with extubation. Patient received Clear Liquid diet this AM. S/p MBSS this afternoon, SLP recommending NPO status given moderate-severe pharyngeal dysphagia. RN to place [place] tube. RD to initiate EN per MD authorization.  Diet Order:  NPO  Meds: Scheduled Meds:   . allopurinol  300 mg Oral Daily  . amiodarone  200 mg Oral BID  . antiseptic oral rinse  15 mL Mouth Rinse QID  . aspirin EC  325 mg Oral Daily   Or  . aspirin  324 mg Per Tube Daily  . bisacodyl  10 mg Oral Daily   Or  . bisacodyl  10 mg Rectal Daily  . budesonide  0.25 mg Nebulization Q6H  . buPROPion  150 mg Oral BID  . chlorhexidine  15 mL Mouth Rinse BID  . docusate  200 mg Per Tube Daily  . enoxaparin (LOVENOX) injection  30 mg Subcutaneous Q24H  . furosemide  20 mg Intravenous Q8H  . insulin aspart  0-24 Units Subcutaneous Q4H  . insulin regular  0-10 Units Intravenous TID WC  . levalbuterol  0.63 mg Nebulization Q6H  . metoprolol tartrate  25 mg Oral BID  . pantoprazole (PROTONIX) IV  40 mg Intravenous QHS  . potassium chloride  10 mEq Intravenous Q1 Hr x 2  . potassium chloride  10 mEq Intravenous Q1 Hr x 3  . potassium chloride  10 mEq Intravenous Q1 Hr x 2  . potassium chloride      . simvastatin  10 mg Oral QHS  . sodium chloride  3 mL Intravenous Q12H  . DISCONTD: metoprolol tartrate  12.5 mg Per Tube BID  . DISCONTD: metoprolol tartrate  12.5 mg Oral BID  . DISCONTD: piperacillin-tazobactam (ZOSYN)  IV  3.375 g Intravenous Q8H   Continuous Infusions:   . sodium chloride 20 mL/hr at 11/07/11 0600  . feeding supplement (OSMOLITE 1.2 CAL) 1,000 mL (11/03/11 2000)  . insulin  (NOVOLIN-R) infusion Stopped (10/31/11 2005)  . DISCONTD: amiodarone (NEXTERONE PREMIX) 360 mg/200 mL dextrose 30 mg/hr (11/07/11 0600)  . DISCONTD: dexmedetomidine (PRECEDEX) IV infusion for high rates Stopped (11/06/11 1300)  . DISCONTD: DOPamine 2.5 mcg/kg/min (11/07/11 0600)  . DISCONTD: lactated ringers 20 mL/hr at 11/01/11 0700   PRN Meds:.fentaNYL, haloperidol lactate, metoprolol, ondansetron (ZOFRAN) IV, oxyCODONE, potassium chloride, sodium chloride, DISCONTD: midazolam, DISCONTD:  morphine injection  Labs:  CMP     Component Value Date/Time   NA 144 11/07/2011 0400   K 3.4* 11/07/2011 0400   CL 99 11/07/2011 0400   CO2 29 11/07/2011 0400   GLUCOSE 133* 11/07/2011 0400   BUN 43* 11/07/2011 0400   CREATININE 1.57* 11/07/2011 0400   CREATININE 1.58* 09/18/2011 1224   CALCIUM 9.0 11/07/2011 0400   PROT 5.0* 11/03/2011 0310   ALBUMIN 2.2* 11/03/2011 0310   AST 20 11/03/2011 0310   ALT 10 11/03/2011 0310   ALKPHOS 65 11/03/2011 0310   BILITOT 0.5 11/03/2011 0310   GFRNONAA 42* 11/07/2011 0400   GFRAA 49* 11/07/2011 0400     Intake/Output Summary (Last 24 hours) at 11/07/11 1056 Last data filed at 11/07/11 0900  Gross per 24 hour  Intake  1120.8 ml  Output   3180 ml  Net -2059.2 ml    CBG (last 3)   Basename 11/07/11 0752 11/07/11 0401 11/07/11 0004  GLUCAP 97 154* 92    Weight Status:  96.2 kg (6/28 ) -- stable  Re-estimated needs:  2100-2300 kcals, 100-110 gm protein  Nutrition Dx:  Inadequate Oral Intake r/t moderate-severe dysphagia as evidenced by NPO status, ongoing  New Goal:  EN to meet >90% of estimated nutrition needs, progressing Monitor: EN regimen & tolerance, weight, labs, I/O's  Alger Memos Pager #: [phone]

## 2011-11-07 NOTE — Progress Notes (Signed)
Nursing Note  Unable to flush panda, removed panda, kinked tubing.  Attempted to place new panda, patient was unable to tolerate and coughing. Stopped placement attempt and obtained ABD xray, tip terminating in RLLobe per radiologist. Karie Soda removed and MD on call paged, see new orders.  Plan to place panda under fluro at a later time. Patient resting comfortably, no complaints at this time. Will continue to monitor closely.   Dorette Grate RN

## 2011-11-07 NOTE — Progress Notes (Signed)
[name] placed after SLT eval indicated aspiration Leave on maint IVF until TF approaches goal

## 2011-11-07 NOTE — Progress Notes (Deleted)
CARDIAC REHAB PHASE I   PRE:  Rate/Rhythm: 76 SR PVC's   BP:  Supine:   Sitting: 135/46  Standing:    SaO2: 97 RA  MODE:  Ambulation: 350 ft   POST:  Rate/Rhythem: 96 SR PVC's  BP:  Supine:   Sitting: 166/58  Standing:    SaO2: 96 RA 1150-1230 Assisted X 1 and used walker to ambulate. Gait steady with walker. No c/o of cp or SOB with walking.BP after walk 166/58. Pt back to recliner after walk with call light in reach and daughter present.Completed MI education with pt and daughter.   Beatrix Fetters

## 2011-11-07 NOTE — Progress Notes (Signed)
Speech Language Pathology  Patient Details Name: Nathan [name] MRN: [id number] DOB: 13-Dec-1940 Today's Date: 11/07/2011 Time:  -    Bedside swallow completed.  Full documentation pending, however s/s aspiration observed.  Recommend MBS which is scheduled for today at 1300.  Breck Coons [place].Ed [employer] [phone]  11/07/2011

## 2011-11-07 NOTE — Evaluation (Signed)
Physical Therapy Evaluation Patient Details Name: Nathan [name] MRN: [id number] DOB: 06-Jun-1940 Today's Date: 11/07/2011 Time: 4098-1191 PT Time Calculation (min): 31 min  PT Assessment / Plan / Recommendation Clinical Impression  Pt s/p CABG and R CEA, VDRF with reintubation. Pt will benefit from acute therapy to maximize mobility, transfers, gait and functional mobility prior to discharge to decrease burden of care. Recommend daily ambulation with nursing. Pt educated x 4 for sternal precautions and able to recall 2 by end of eval.     PT Assessment  Patient needs continued PT services    Follow Up Recommendations  Home health PT;Supervision for mobility/OOB    Barriers to Discharge None      Equipment Recommendations  Rolling walker with 5" wheels    Recommendations for Other Services OT consult;Speech consult   Frequency Min 3X/week    Precautions / Restrictions Precautions Precautions: Sternal Precaution Comments: oxygen   Pertinent Vitals/Pain sats 85-95% on 4L throughout  99% on 3L at rest end of session HR 95-101 throughout No pain      Mobility  Bed Mobility Bed Mobility: Rolling Left;Left Sidelying to Sit;Sitting - Scoot to Edge of Bed Rolling Left: 4: Min guard Left Sidelying to Sit: 4: Min guard;HOB flat Sitting - Scoot to Edge of Bed: 4: Min guard Details for Bed Mobility Assistance: cueing for sequence and precautions with transfers.  Transfers Transfers: Sit to Stand;Stand to Sit Sit to Stand: 4: Min assist;From bed Stand to Sit: 4: Min guard;To chair/3-in-1 Details for Transfer Assistance: cueing for hand placement, sequence, transfer and assist for anterior translation Ambulation/Gait Ambulation/Gait Assistance: 4: Min guard Ambulation Distance (Feet): 120 Feet Assistive device: Rolling walker Ambulation/Gait Assistance Details: cueing for posture and position in RW Gait Pattern: Step-through pattern;Decreased stride length;Trunk flexed Gait  velocity: decreased with at least 4 standing rests- unclear if due to fatigue or pt just confused to continue ambulation Stairs: No    Exercises     PT Diagnosis: Difficulty walking;Abnormality of gait;Altered mental status  PT Problem List: Decreased activity tolerance;Decreased mobility;Decreased cognition;Decreased knowledge of precautions;Decreased knowledge of use of DME PT Treatment Interventions: Gait training;DME instruction;Stair training;Functional mobility training;Therapeutic activities;Therapeutic exercise;Patient/family education;Cognitive remediation   PT Goals Acute Rehab PT Goals PT Goal Formulation: With patient Time For Goal Achievement: 11/21/11 Potential to Achieve Goals: Good Pt will go Supine/Side to Sit: with modified independence PT Goal: Supine/Side to Sit - Progress: Goal set today Pt will go Sit to Supine/Side: with modified independence PT Goal: Sit to Supine/Side - Progress: Goal set today Pt will go Sit to Stand: with supervision PT Goal: Sit to Stand - Progress: Goal set today Pt will go Stand to Sit: with supervision PT Goal: Stand to Sit - Progress: Goal set today Pt will Ambulate: with supervision;with least restrictive assistive device PT Goal: Ambulate - Progress: Goal set today Pt will Go Up / Down Stairs: 3-5 stairs;with rail(s);with supervision PT Goal: Up/Down Stairs - Progress: Goal set today Additional Goals Additional Goal #1: Pt will independently state and demonstrate awareness of all sternal precautions PT Goal: Additional Goal #1 - Progress: Goal set today  Visit Information  Last PT Received On: 11/07/11 Assistance Needed: +1    Subjective Data  Subjective: It feels good to get up Patient Stated Goal: get back to golf 3x a week   Prior Functioning  Home Living Lives With: Spouse Available Help at Discharge: Available 24 hours/day;Family Type of Home: House Home Access: Stairs to enter [employer] of  Steps:  5 Entrance Stairs-Rails: Left;Right Home Layout: One level Bathroom Shower/Tub: Walk-in shower;Tub/shower unit Bathroom Toilet: Standard Home Adaptive Equipment: None Prior Function Level of Independence: Independent Able to Take Stairs?: Yes Driving: Yes Vocation: Retired Comments: spouse does the housework and Electrical engineer: No difficulties Dominant Hand: Right    Cognition  Overall Cognitive Status: Impaired Area of Impairment: Memory Arousal/Alertness: Awake/alert Orientation Level: Disoriented to;Time;Situation Behavior During Session: WFL for tasks performed Memory: Decreased recall of precautions    Extremity/Trunk Assessment Right Lower Extremity Assessment RLE ROM/Strength/Tone: [facility] for tasks assessed Left Lower Extremity Assessment LLE ROM/Strength/Tone: WFL for tasks assessed   Balance Static Sitting Balance Static Sitting - Balance Support: Feet supported;No upper extremity supported Static Sitting - Level of Assistance: 5: Stand by assistance Static Sitting - Comment/# of Minutes: 3  End of Session PT - End of Session Equipment Utilized During Treatment: Gait belt Activity Tolerance: Patient tolerated treatment well Patient left: in chair;with call bell/phone within reach;with nursing in room Nurse Communication: Mobility status  GP     Delorse Lek 11/07/2011, 9:01 AM  Delaney Meigs, PT [phone]

## 2011-11-08 ENCOUNTER — Inpatient Hospital Stay (HOSPITAL_COMMUNITY): Payer: Medicare PPO

## 2011-11-08 DIAGNOSIS — I2510 Atherosclerotic heart disease of native coronary artery without angina pectoris: Principal | ICD-10-CM

## 2011-11-08 LAB — BASIC METABOLIC PANEL
BUN: 45 mg/dL — ABNORMAL HIGH (ref 6–23)
CO2: 28 mEq/L (ref 19–32)
Calcium: 9.2 mg/dL (ref 8.4–10.5)
Chloride: 101 mEq/L (ref 96–112)
Creatinine, Ser: 1.86 mg/dL — ABNORMAL HIGH (ref 0.50–1.35)
GFR calc Af Amer: 40 mL/min — ABNORMAL LOW (ref >90)
GFR calc non Af Amer: 35 mL/min — ABNORMAL LOW (ref >90)
Glucose, Bld: 99 mg/dL (ref 70–99)
Potassium: 3.4 mEq/L — ABNORMAL LOW (ref 3.5–5.1)
Sodium: 143 mEq/L (ref 135–145)

## 2011-11-08 LAB — GLUCOSE, CAPILLARY
Glucose-Capillary: 88 mg/dL (ref 70–99)
Glucose-Capillary: 90 mg/dL (ref 70–99)
Glucose-Capillary: 91 mg/dL (ref 70–99)
Glucose-Capillary: 91 mg/dL (ref 70–99)
Glucose-Capillary: 95 mg/dL (ref 70–99)
Glucose-Capillary: 97 mg/dL (ref 70–99)

## 2011-11-08 LAB — CBC
HCT: 27.9 % — ABNORMAL LOW (ref 39.0–52.0)
Hemoglobin: 9 g/dL — ABNORMAL LOW (ref 13.0–17.0)
MCH: 28.8 pg (ref 26.0–34.0)
MCHC: 32.3 g/dL (ref 30.0–36.0)
MCV: 89.4 fL (ref 78.0–100.0)
Platelets: 268 10*3/uL (ref 150–400)
RBC: 3.12 MIL/uL — ABNORMAL LOW (ref 4.22–5.81)
RDW: 16.2 % — ABNORMAL HIGH (ref 11.5–15.5)
WBC: 9.3 10*3/uL (ref 4.0–10.5)

## 2011-11-08 LAB — MAGNESIUM: Magnesium: 2.5 mg/dL (ref 1.5–2.5)

## 2011-11-08 MED ORDER — HALOPERIDOL LACTATE 5 MG/ML IJ SOLN
1.0000 mg | Freq: Four times a day (QID) | INTRAMUSCULAR | Status: DC | PRN
  Administered 2011-11-08 (×4): 4 mg via INTRAVENOUS
  Filled 2011-11-08 (×4): qty 1

## 2011-11-08 MED ORDER — SODIUM CHLORIDE 0.9 % IJ SOLN
10.0000 mL | INTRAMUSCULAR | Status: DC | PRN
  Administered 2011-11-12 – 2011-11-15 (×8): 10 mL

## 2011-11-08 MED ORDER — AMIODARONE HCL IN DEXTROSE 360-4.14 MG/200ML-% IV SOLN
30.0000 mg/h | INTRAVENOUS | Status: DC
  Administered 2011-11-08 – 2011-11-14 (×9): 30 mg/h via INTRAVENOUS
  Filled 2011-11-08 (×27): qty 200

## 2011-11-08 MED ORDER — SODIUM CHLORIDE 0.9 % IJ SOLN
10.0000 mL | Freq: Two times a day (BID) | INTRAMUSCULAR | Status: DC
  Administered 2011-11-08 – 2011-11-09 (×2): 10 mL
  Administered 2011-11-09: 30 mL
  Administered 2011-11-10 (×2): 10 mL

## 2011-11-08 MED ORDER — HALOPERIDOL LACTATE 5 MG/ML IJ SOLN
INTRAMUSCULAR | Status: AC
  Administered 2011-11-08: 4 mg via INTRAVENOUS
  Filled 2011-11-08: qty 1

## 2011-11-08 NOTE — Progress Notes (Signed)
Xray called re panda placement, Dr Maren Beach notified of need to call radiologist, also IV called again re PICC insertion, to wait and start amiodarone after PICC in place

## 2011-11-08 NOTE — Progress Notes (Signed)
Pt has been on room air all day. RT charted 2L [place] in error.

## 2011-11-08 NOTE — Progress Notes (Signed)
Peripherally Inserted Central Catheter/Midline Placement  The IV Nurse has discussed with the patient and/or persons authorized to consent for the patient, the purpose of this procedure and the potential benefits and risks involved with this procedure.  The benefits include less needle sticks, lab draws from the catheter and patient may be discharged home with the catheter.  Risks include, but not limited to, infection, bleeding, blood clot (thrombus formation), and puncture of an artery; nerve damage and irregular heat beat.  Alternatives to this procedure were also discussed.  PICC/Midline Placement Documentation        Stacie Glaze Horton 11/08/2011, 6:07 PM

## 2011-11-08 NOTE — Progress Notes (Addendum)
VASCULAR AND VEIN SURGERY POST - OP CEA PROGRESS NOTE  Date of Surgery: 10/31/2011 Surgeon: Surgeon(s): Nada Libman, MD Delight Ovens, MD 8 Days Post-Op right Carotid Endarterectomy .  HPI: Nathan [name] is a 71 y.o. male who is 8 Days Post-Op right Carotid Endarterectomy . Patient is doing well. Patient denies headache; Patient positive difficulty swallowing; hoarseness of voice . Pt failed swallow study  denies weakness in upper or lower extremities; Pt. denies other symptoms of stroke or TIA.  IMAGING: Dg Chest Port 1 View  11/08/2011  *RADIOLOGY REPORT*  Clinical Data: Postop CABG  PORTABLE CHEST - 1 VIEW  Comparison: 11/07/2011  Findings: Cardiomegaly.  Mild pulmonary vascular congestion without frank interstitial edema.  Possible small left pleural effusion. No pneumothorax is seen.  Postsurgical changes related to prior CABG.  IMPRESSION: Cardiomegaly with mild pulmonary vascular congestion.  No frank interstitial edema.  Possible small left pleural effusion.  Original Report Authenticated By: Charline Bills, M.D.   Dg Chest Port 1 View  11/07/2011  *RADIOLOGY REPORT*  Clinical Data: Chest tube.  Coronary artery disease.  CABG.  PORTABLE CHEST - 1 VIEW  Comparison: 1 day prior  Findings: Left IJ Cordis sheath is unchanged.  Extubation and removal of nasogastric tube.  Mild cardiomegaly.  Left-sided pleural effusion is slightly increased. No pneumothorax.  Mild pulmonary venous congestion is shifting, greater left than right.  Worsened left base aeration with increased airspace disease.  IMPRESSION:  1.  Shifting mild asymmetric pulmonary venous congestion. 2.  Extubation and removal of nasogastric tube. 3.  Increased left pleural effusion and left base air space disease.  Original Report Authenticated By: Consuello Bossier, M.D.   Dg Abd Portable 1v  11/07/2011  *RADIOLOGY REPORT*  Clinical Data: Feeding tube placement  PORTABLE ABDOMEN - 1 VIEW  Comparison: Portable exam 2120  hours compared to as 11/07/2011 at 1622 hours  Findings: Epicardial pacing wires present. Prior mean sternotomy. Feeding tube projects over medial lower right chest question within right lower lobe bronchus. This should be withdrawn and replaced. Visualized bowel gas pattern normal. Bones demineralized.  IMPRESSION: Tip of feeding tube projects over the medial lower right chest, suspect within right lower lobe bronchus. Tube should be withdrawn and replaced.  Critical Value/emergent results were called by telephone at the time of interpretation on 11/07/2011  at 2131 hours  to  [facility] on 2300, who verbally acknowledged these results.  Original Report Authenticated By: Lollie Marrow, M.D.   Dg Abd Portable 1v  11/07/2011  *RADIOLOGY REPORT*  Clinical Data: Evaluate feeding tube placement  PORTABLE ABDOMEN - 1 VIEW  Comparison:  07/15/2011  Findings: The feeding tube is identified in place.  The distal portion is looped upon itself and is situated in the distal portion of the stomach.  Epicardial pacer leads are identified.  The bowel gas pattern is nonobstructed.  IMPRESSION:  1.  Feeding tube is within the stomach with distal portion looped upon itself  Original Report Authenticated By: Rosealee Albee, M.D.   Dg Swallowing Func-no Report  11/07/2011  CLINICAL DATA: dysphagia   FLUOROSCOPY FOR SWALLOWING FUNCTION STUDY:  Fluoroscopy was provided for swallowing function study, which was  administered by a speech pathologist.  Final results and recommendations  from this study are contained within the speech pathology report.      Significant Diagnostic Studies: CBC Lab Results  Component Value Date   WBC 9.3 11/08/2011   HGB 9.0* 11/08/2011   HCT  27.9* 11/08/2011   MCV 89.4 11/08/2011   PLT 268 11/08/2011    BMET    Component Value Date/Time   NA 143 11/08/2011 0400   K 3.4* 11/08/2011 0400   CL 101 11/08/2011 0400   CO2 28 11/08/2011 0400   GLUCOSE 99 11/08/2011 0400   BUN 45* 11/08/2011 0400    CREATININE 1.86* 11/08/2011 0400   CREATININE 1.58* 09/18/2011 1224   CALCIUM 9.2 11/08/2011 0400   GFRNONAA 35* 11/08/2011 0400   GFRAA 40* 11/08/2011 0400    COAG Lab Results  Component Value Date   INR 1.38 11/03/2011   INR 1.57* 10/31/2011   INR 1.02 10/29/2011   No results found for this basename: PTT      Intake/Output Summary (Last 24 hours) at 11/08/11 4696 Last data filed at 11/08/11 0700  Gross per 24 hour  Intake 563.33 ml  Output    850 ml  Net -286.67 ml    Physical Exam:  BP Readings from Last 3 Encounters:  11/08/11 147/58  11/08/11 147/58  10/29/11 134/68   Temp Readings from Last 3 Encounters:  11/08/11 98.8 F (37.1 C) Oral  11/08/11 98.8 F (37.1 C) Oral  10/29/11 96.9 F (36.1 C) Oral   SpO2 Readings from Last 3 Encounters:  11/08/11 93%  11/08/11 93%  10/29/11 97%   Pulse Readings from Last 3 Encounters:  11/08/11 81  11/08/11 81  10/29/11 57    Pt is A&O x 3 Ambulating well Speech is fluent right Neck Wound is healing well Patient with Negative tongue deviation and Negative facial droop Pt has good and equal strength in all extremities  Assessment: Nathan [name] is a 70 y.o. male is S/P Right Carotid endarterectomy Pt with hoarseness and diff swallowing- failed swallow test. Otherwise neurologically intact   Plan: Per cardiology We will schedule OP F/U appt. Marlowe Shores [phone] 11/08/2011 9:22 AM  Addendum  I have independently interviewed and examined the patient, and I agree with the physician assistant's findings.  Chart reviewed.  Failed swallow noted.  Pt with intact neuro exam otherwise.  Inc w/o hematoma. Nothing more to add to care.  Leonides Sake, MD [facility] Office: [phone] Pager: [phone]  11/08/2011, 10:04 AM

## 2011-11-08 NOTE — Progress Notes (Signed)
Patient ID: Nathan [name], male   DOB: Jan 17, 1941, 71 y.o.   MRN: [id number] 8 Days Post-Op Procedure(s) (LRB): CORONARY ARTERY BYPASS GRAFTING (CABG) (N/A) ENDARTERECTOMY CAROTID (Right) Subjective   Slight confusion last pm req haldol [name] attempted multiple times w/o success-- will need floro Objective: Vital signs in last 24 hours: Temp:  [98.5 F (36.9 C)-99.2 F (37.3 C)] 98.8 F (37.1 C) (06/29 0400) Pulse Rate:  [79-102] 84  (06/29 1100) Cardiac Rhythm:  [-] Normal sinus rhythm (06/29 0752) Resp:  [13-28] 22  (06/29 1100) BP: (100-170)/(56-92) 152/69 mmHg (06/29 1100) SpO2:  [90 %-100 %] 95 % (06/29 1100) Weight:  [191 lb 2.2 oz (86.7 kg)] 191 lb 2.2 oz (86.7 kg) (06/29 0600)  Hemodynamic parameters for last 24 hours:   nsr Intake/Output from previous day: 06/28 0701 - 06/29 0700 In: 844.6 [P.O.:360; I.V.:484.6] Out: 1125 [Urine:1125] Intake/Output this shift:    Resting comfortably150/70 Lab Results:  Basename 11/08/11 0400 11/07/11 0400  WBC 9.3 7.7  HGB 9.0* 9.1*  HCT 27.9* 28.1*  PLT 268 196   BMET:  Basename 11/08/11 0400 11/07/11 0400  NA 143 144  K 3.4* 3.4*  CL 101 99  CO2 28 29  GLUCOSE 99 133*  BUN 45* 43*  CREATININE 1.86* 1.57*  CALCIUM 9.2 9.0    PT/INR: No results found for this basename: LABPROT,INR in the last 72 hours ABG    Component Value Date/Time   PHART 7.467* 11/06/2011 0434   HCO3 30.5* 11/06/2011 0434   TCO2 31.8 11/06/2011 0434   ACIDBASEDEF 1.0 11/02/2011 0423   O2SAT 99.4 11/06/2011 0434   CBG (last 3)   Basename 11/08/11 0810 11/08/11 0359 11/08/11 0024  GLUCAP 95 97 91    Assessment/Plan: S/P Procedure(s) (LRB): CORONARY ARTERY BYPASS GRAFTING (CABG) (N/A) ENDARTERECTOMY CAROTID (Right) Creat improved resp status stable Start TF until swallow fx returns, IV amio until he can swallow  LOS: 8 days    VAN TRIGT III,Aldo Sondgeroth 11/08/2011

## 2011-11-09 ENCOUNTER — Inpatient Hospital Stay (HOSPITAL_COMMUNITY): Payer: Medicare PPO

## 2011-11-09 LAB — GLUCOSE, CAPILLARY
Glucose-Capillary: 85 mg/dL (ref 70–99)
Glucose-Capillary: 91 mg/dL (ref 70–99)
Glucose-Capillary: 94 mg/dL (ref 70–99)
Glucose-Capillary: 95 mg/dL (ref 70–99)
Glucose-Capillary: 98 mg/dL (ref 70–99)
Glucose-Capillary: 99 mg/dL (ref 70–99)

## 2011-11-09 LAB — COMPREHENSIVE METABOLIC PANEL
ALT: 48 U/L (ref 0–53)
AST: 39 U/L — ABNORMAL HIGH (ref 0–37)
Albumin: 2.7 g/dL — ABNORMAL LOW (ref 3.5–5.2)
Alkaline Phosphatase: 122 U/L — ABNORMAL HIGH (ref 39–117)
BUN: 42 mg/dL — ABNORMAL HIGH (ref 6–23)
CO2: 28 mEq/L (ref 19–32)
Calcium: 9.1 mg/dL (ref 8.4–10.5)
Chloride: 102 mEq/L (ref 96–112)
Creatinine, Ser: 1.58 mg/dL — ABNORMAL HIGH (ref 0.50–1.35)
GFR calc Af Amer: 49 mL/min — ABNORMAL LOW (ref >90)
GFR calc non Af Amer: 42 mL/min — ABNORMAL LOW (ref >90)
Glucose, Bld: 99 mg/dL (ref 70–99)
Potassium: 3.5 mEq/L (ref 3.5–5.1)
Sodium: 144 mEq/L (ref 135–145)
Total Bilirubin: 1.4 mg/dL — ABNORMAL HIGH (ref 0.3–1.2)
Total Protein: 6 g/dL (ref 6.0–8.3)

## 2011-11-09 LAB — CBC
HCT: 28.4 % — ABNORMAL LOW (ref 39.0–52.0)
Hemoglobin: 9.1 g/dL — ABNORMAL LOW (ref 13.0–17.0)
MCH: 29.3 pg (ref 26.0–34.0)
MCHC: 32 g/dL (ref 30.0–36.0)
MCV: 91.3 fL (ref 78.0–100.0)
Platelets: 324 10*3/uL (ref 150–400)
RBC: 3.11 MIL/uL — ABNORMAL LOW (ref 4.22–5.81)
RDW: 16.6 % — ABNORMAL HIGH (ref 11.5–15.5)
WBC: 10.3 10*3/uL (ref 4.0–10.5)

## 2011-11-09 MED ORDER — LABETALOL HCL 5 MG/ML IV SOLN
20.0000 mg | INTRAVENOUS | Status: DC | PRN
  Administered 2011-11-09 – 2011-11-11 (×2): 20 mg via INTRAVENOUS
  Filled 2011-11-09 (×3): qty 4

## 2011-11-09 MED ORDER — METOPROLOL TARTRATE 25 MG PO TABS
25.0000 mg | ORAL_TABLET | Freq: Two times a day (BID) | ORAL | Status: DC
  Filled 2011-11-09 (×2): qty 1

## 2011-11-09 MED ORDER — ASPIRIN 300 MG RE SUPP
300.0000 mg | Freq: Every day | RECTAL | Status: DC
  Administered 2011-11-11 – 2011-11-13 (×3): 300 mg via RECTAL
  Filled 2011-11-09 (×6): qty 1

## 2011-11-09 MED ORDER — FUROSEMIDE 10 MG/ML IJ SOLN
40.0000 mg | Freq: Once | INTRAMUSCULAR | Status: AC
  Administered 2011-11-09: 40 mg via INTRAVENOUS
  Filled 2011-11-09: qty 4

## 2011-11-09 MED ORDER — TRAVASOL 10 % IV SOLN: INTRAVENOUS | Status: DC

## 2011-11-09 MED ORDER — METOPROLOL TARTRATE 1 MG/ML IV SOLN
5.0000 mg | Freq: Four times a day (QID) | INTRAVENOUS | Status: DC
  Administered 2011-11-09 – 2011-11-14 (×19): 5 mg via INTRAVENOUS
  Filled 2011-11-09 (×22): qty 5

## 2011-11-09 MED ORDER — POTASSIUM CHLORIDE 10 MEQ/50ML IV SOLN
10.0000 meq | Freq: Once | INTRAVENOUS | Status: AC
  Administered 2011-11-09: 10 meq via INTRAVENOUS
  Filled 2011-11-09: qty 50

## 2011-11-09 NOTE — Progress Notes (Signed)
9 Days Post-Op Procedure(s) (LRB): CORONARY ARTERY BYPASS GRAFTING (CABG) (N/A) ENDARTERECTOMY CAROTID (Right) Subjective:[name] attemt under floro w/o success-  TPN ordered  Objective: Vital signs in last 24 hours: Temp:  [98.2 F (36.8 C)-99.3 F (37.4 C)] 98.4 F (36.9 C) (06/30 1557) Pulse Rate:  [69-89] 70  (06/30 1700) Cardiac Rhythm:  [-] Normal sinus rhythm (06/30 1200) Resp:  [11-29] 18  (06/30 1700) BP: (147-170)/(56-104) 153/61 mmHg (06/30 1700) SpO2:  [90 %-99 %] 94 % (06/30 1700) Weight:  [190 lb 4.1 oz (86.3 kg)] 190 lb 4.1 oz (86.3 kg) (06/30 0600)  Hemodynamic parameters for last 24 hours:  NSR on iv amio  Intake/Output from previous day: 06/29 0701 - 06/30 0700 In: 1433.8 [I.V.:1433.8] Out: 1051 [Urine:1050; Stool:1] Intake/Output this shift: Total I/O In: 667 [I.V.:667] Out: 1535 [Urine:1535]    Lab Results:  Basename 11/09/11 0421 11/08/11 0400  WBC 10.3 9.3  HGB 9.1* 9.0*  HCT 28.4* 27.9*  PLT 324 268   BMET:  Basename 11/09/11 0421 11/08/11 0400  NA 144 143  K 3.5 3.4*  CL 102 101  CO2 28 28  GLUCOSE 99 99  BUN 42* 45*  CREATININE 1.58* 1.86*  CALCIUM 9.1 9.2    PT/INR: No results found for this basename: LABPROT,INR in the last 72 hours ABG    Component Value Date/Time   PHART 7.467* 11/06/2011 0434   HCO3 30.5* 11/06/2011 0434   TCO2 31.8 11/06/2011 0434   ACIDBASEDEF 1.0 11/02/2011 0423   O2SAT 99.4 11/06/2011 0434   CBG (last 3)   Basename 11/09/11 1556 11/09/11 1228 11/09/11 0754  GLUCAP 99 85 95    Assessment/Plan: S/P Procedure(s) (LRB): CORONARY ARTERY BYPASS GRAFTING (CABG) (N/A) ENDARTERECTOMY CAROTID (Right) TPN tomorrow SLT to follow   LOS: 9 days    VAN TRIGT III,Ashlye Oviedo 11/09/2011

## 2011-11-10 ENCOUNTER — Telehealth: Payer: Self-pay | Admitting: Surgery

## 2011-11-10 LAB — CULTURE, BLOOD (ROUTINE X 2): Culture: NO GROWTH

## 2011-11-10 LAB — PHOSPHORUS: Phosphorus: 5.1 mg/dL — ABNORMAL HIGH (ref 2.3–4.6)

## 2011-11-10 LAB — GLUCOSE, CAPILLARY
Glucose-Capillary: 104 mg/dL — ABNORMAL HIGH (ref 70–99)
Glucose-Capillary: 109 mg/dL — ABNORMAL HIGH (ref 70–99)
Glucose-Capillary: 122 mg/dL — ABNORMAL HIGH (ref 70–99)
Glucose-Capillary: 89 mg/dL (ref 70–99)
Glucose-Capillary: 94 mg/dL (ref 70–99)
Glucose-Capillary: 98 mg/dL (ref 70–99)

## 2011-11-10 LAB — COMPREHENSIVE METABOLIC PANEL
ALT: 66 U/L — ABNORMAL HIGH (ref 0–53)
AST: 64 U/L — ABNORMAL HIGH (ref 0–37)
Albumin: 2.6 g/dL — ABNORMAL LOW (ref 3.5–5.2)
Alkaline Phosphatase: 106 U/L (ref 39–117)
BUN: 42 mg/dL — ABNORMAL HIGH (ref 6–23)
CO2: 30 mEq/L (ref 19–32)
Calcium: 8.9 mg/dL (ref 8.4–10.5)
Chloride: 102 mEq/L (ref 96–112)
Creatinine, Ser: 1.71 mg/dL — ABNORMAL HIGH (ref 0.50–1.35)
GFR calc Af Amer: 44 mL/min — ABNORMAL LOW (ref >90)
GFR calc non Af Amer: 38 mL/min — ABNORMAL LOW (ref >90)
Glucose, Bld: 113 mg/dL — ABNORMAL HIGH (ref 70–99)
Potassium: 3.5 mEq/L (ref 3.5–5.1)
Sodium: 143 mEq/L (ref 135–145)
Total Bilirubin: 1.3 mg/dL — ABNORMAL HIGH (ref 0.3–1.2)
Total Protein: 5.9 g/dL — ABNORMAL LOW (ref 6.0–8.3)

## 2011-11-10 LAB — MAGNESIUM: Magnesium: 2.4 mg/dL (ref 1.5–2.5)

## 2011-11-10 MED ORDER — SODIUM CHLORIDE 0.45 % IV SOLN: INTRAVENOUS | Status: DC

## 2011-11-10 MED ORDER — INSULIN ASPART 100 UNIT/ML ~~LOC~~ SOLN
0.0000 [IU] | SUBCUTANEOUS | Status: DC
  Administered 2011-11-10: 2 [IU] via SUBCUTANEOUS
  Administered 2011-11-12: 1 [IU] via SUBCUTANEOUS

## 2011-11-10 MED ORDER — POTASSIUM CHLORIDE 10 MEQ/50ML IV SOLN: 10.0000 meq | INTRAVENOUS | Status: AC

## 2011-11-10 MED ORDER — POTASSIUM CHLORIDE 10 MEQ/50ML IV SOLN
INTRAVENOUS | Status: AC
  Administered 2011-11-10: 10 meq
  Filled 2011-11-10: qty 100

## 2011-11-10 MED ORDER — ZINC TRACE METAL 1 MG/ML IV SOLN
INTRAVENOUS | Status: AC
  Administered 2011-11-10: 17:00:00 via INTRAVENOUS
  Filled 2011-11-10: qty 1000

## 2011-11-10 MED ORDER — FAT EMULSION 20 % IV EMUL
100.0000 mL | INTRAVENOUS | Status: AC
  Administered 2011-11-10: 100 mL via INTRAVENOUS
  Filled 2011-11-10 (×3): qty 100

## 2011-11-10 NOTE — Progress Notes (Signed)
Physical Therapy Treatment Patient Details Name: Nathan [name] MRN: [id number] DOB: 1940-09-13 Today's Date: 11/10/2011 Time: 4098-1191 PT Time Calculation (min): 16 min  PT Assessment / Plan / Recommendation Comments on Treatment Session  Pt s/p CABG and Right CEA who is progressing very well with mobility and able to recall all sternal precautions end of session after education at beginning of tx. Will continue to follow to maximize independence and safety.     Follow Up Recommendations  No PT follow up;Supervision for mobility/OOB    Barriers to Discharge        Equipment Recommendations       Recommendations for Other Services    Frequency     Plan Discharge plan needs to be updated;Frequency remains appropriate    Precautions / Restrictions Precautions Precautions: Sternal Precaution Comments: no Restrictions Weight Bearing Restrictions: No   Pertinent Vitals/Pain No pain HR 71-81 throughout sats 91-94% on RA    Mobility  Bed Mobility Bed Mobility: Rolling Left;Left Sidelying to Sit;Sitting - Scoot to Edge of Bed Rolling Left: 5: Supervision Left Sidelying to Sit: 4: Min guard;HOB flat Sitting - Scoot to Edge of Bed: 5: Supervision Details for Bed Mobility Assistance: cueing for sequence to maintain precautions Transfers Transfers: Sit to Stand;Stand to Sit Sit to Stand: 5: Supervision;From bed Stand to Sit: 5: Supervision;To chair/3-in-1 Details for Transfer Assistance: cueing for hand placement on thighs Ambulation/Gait Ambulation/Gait Assistance: 6: Modified independent (Device/Increase time) Ambulation Distance (Feet): 300 Feet Assistive device: Rolling walker Ambulation/Gait Assistance Details: good speed and posture will attempt without RW next tx Gait Pattern: Within Functional Limits    Exercises General Exercises - Lower Extremity Long Arc Quad: AROM;Both;20 reps;Seated Hip Flexion/Marching: AROM;Both;20 reps;Seated   PT Diagnosis:    PT Problem  List:   PT Treatment Interventions:     PT Goals Acute Rehab PT Goals PT Goal: Supine/Side to Sit - Progress: Progressing toward goal Pt will go Sit to Stand: with modified independence PT Goal: Sit to Stand - Progress: Updated due to goal met Pt will go Stand to Sit: with modified independence PT Goal: Stand to Sit - Progress: Updated due to goals met Pt will Ambulate: >150 feet;with modified independence;with least restrictive assistive device PT Goal: Ambulate - Progress: Updated due to goal met PT Goal: Up/Down Stairs - Progress: Progressing toward goal Additional Goals PT Goal: Additional Goal #1 - Progress: Progressing toward goal  Visit Information  Last PT Received On: 11/10/11 Assistance Needed: +1    Subjective Data  Subjective: "I've been full of shit a long time"   Cognition  Overall Cognitive Status: Appears within functional limits for tasks assessed/performed Arousal/Alertness: Awake/alert Orientation Level: Appears intact for tasks assessed Behavior During Session: [facility] for tasks performed    Balance     End of Session PT - End of Session Equipment Utilized During Treatment: Gait belt Activity Tolerance: Patient tolerated treatment well Patient left: in chair;with call bell/phone within reach Nurse Communication: Mobility status   GP     Toney Sang Beth 11/10/2011, 10:02 AM Delaney Meigs, PT [phone]

## 2011-11-10 NOTE — Progress Notes (Signed)
Patient ID: Nathan [name], male   DOB: 10/04/1940, 71 y.o.   MRN: [id number]  Filed Vitals:   11/10/11 1635 11/10/11 1700 11/10/11 1800 11/10/11 1900  BP:  162/69 152/61 161/63  Pulse:  76 66 70  Temp: 98.5 F (36.9 C)     TempSrc: Oral     Resp:  18 32 21  Height:      Weight:      SpO2:  92% 93% 99%   Awake and alert Urine output ok Remains on TNA  No new problems today.

## 2011-11-10 NOTE — Progress Notes (Signed)
Patient ID: Nathan [name], male   DOB: 1940/10/24, 71 y.o.   MRN: [id number] TCTS DAILY PROGRESS NOTE                   [address]            [place] [address]          [phone]      10 Days Post-Op Procedure(s) (LRB): CORONARY ARTERY BYPASS GRAFTING (CABG) (N/A) ENDARTERECTOMY CAROTID (Right)  Total Length of Stay:  LOS: 10 days   Subjective: Overall the patient is much improved. His mental status has improved, he was conversing with his family without difficulty.   Objective: Vital signs in last 24 hours: Temp:  [97.3 F (36.3 C)-98.4 F (36.9 C)] 98 F (36.7 C) (07/01 1130) Pulse Rate:  [65-79] 69  (07/01 1200) Cardiac Rhythm:  [-] Normal sinus rhythm (07/01 1200) Resp:  [11-35] 24  (07/01 1000) BP: (122-173)/(59-99) 161/59 mmHg (07/01 1200) SpO2:  [93 %-99 %] 95 % (07/01 1200) Weight:  [194 lb 3.6 oz (88.1 kg)] 194 lb 3.6 oz (88.1 kg) (07/01 0600)  Filed Weights   11/08/11 0600 11/09/11 0600 11/10/11 0600  Weight: 191 lb 2.2 oz (86.7 kg) 190 lb 4.1 oz (86.3 kg) 194 lb 3.6 oz (88.1 kg)    Weight change: 3 lb 15.5 oz (1.8 kg)   Hemodynamic parameters for last 24 hours:    Intake/Output from previous day: 06/30 0701 - 07/01 0700 In: 1617.5 [I.V.:1617.5] Out: 2035 [Urine:2035]  Intake/Output this shift: Total I/O In: 353.5 [I.V.:253.5; IV Piggyback:100] Out: 200 [Urine:200]  Current Meds: Scheduled Meds:   . allopurinol  300 mg Oral Daily  . antiseptic oral rinse  15 mL Mouth Rinse QID  . aspirin  300 mg Rectal Daily  . bisacodyl  10 mg Rectal Daily  . budesonide  0.25 mg Nebulization Q6H  . chlorhexidine  15 mL Mouth Rinse BID  . enoxaparin (LOVENOX) injection  30 mg Subcutaneous Q24H  . insulin aspart  0-9 Units Subcutaneous Q4H  . levalbuterol  0.63 mg Nebulization Q6H  . metoprolol  5 mg Intravenous Q6H  . potassium chloride  10 mEq Intravenous Once  . potassium chloride  10 mEq Intravenous Q1 Hr x 2  . potassium chloride       . sodium chloride  10-40 mL Intracatheter Q12H  Continuous Infusions:   . sodium chloride    . amiodarone (NEXTERONE PREMIX) 360 mg/200 mL dextrose 30 mg/hr (11/10/11 1200)  . TPN (CLINIMIX) +/- additives     And  . fat emulsion                 PRN Meds:.haloperidol lactate, labetalol, metoprolol, morphine injection, ondansetron (ZOFRAN) IV, oxyCODONE, potassium chloride, sodium chloride, DISCONTD: sodium chloride  General appearance: alert, cooperative and no distress Neurologic: intact Heart: regular rate and rhythm, S1, S2 normal, no murmur, click, rub or gallop and normal apical impulse Lungs: clear to auscultation bilaterally and normal percussion bilaterally Abdomen: soft, non-tender; bowel sounds normal; no masses,  no organomegaly Extremities: extremities normal, atraumatic, no cyanosis or edema and Homans sign is negative, no sign of DVT Wound: Sternum is stable   Lab Results: CBC: Basename 11/09/11 0421 11/08/11 0400  WBC 10.3 9.3  HGB 9.1* 9.0*  HCT 28.4* 27.9*  PLT 324 268   BMET:  Basename 11/10/11 0430 11/09/11 0421  NA 143 144  K 3.5 3.5  CL 102 102  CO2 30  28  GLUCOSE 113* 99  BUN 42* 42*  CREATININE 1.71* 1.58*  CALCIUM 8.9 9.1    PT/INR: No results found for this basename: LABPROT,INR in the last 72 hours Radiology: Dg Chest Port 1 View  11/09/2011  *RADIOLOGY REPORT*  Clinical Data: Coronary bypass grafting  PORTABLE CHEST - 1 VIEW  Comparison:   the previous day's study  Findings: Previous CABG.  Right arm PICC to the low SVC.  Cervical fixation hardware noted.  Stable mild cardiomegaly.  Improvement in the mild interstitial edema seen previously.  No definite effusion although the lateral costophrenic angles are excluded.  IMPRESSION:  1.  Stable cardiomegaly with improvement in mild interstitial edema. 2.  PICC line to the SVC.  Original Report Authenticated By: Osa Craver, M.D.   Dg Naso G Tube Plc W/fl W/rad  11/09/2011  *RADIOLOGY  REPORT*  Clinical Data: Needs feeding tube.  NASO G TUEB PLACEMENT WITH FL AND WITH RAD  Comparison:  11/07/2011  Findings: Multiple attempts to pass a feeding tube with into the stomach were performed without success.  IMPRESSION: Despite multiple attempts we were unable to pass a feeding tube into the stomach under direct fluoroscopic guidance  Original Report Authenticated By: Rosealee Albee, M.D.     Assessment/Plan: S/P Procedure(s) (LRB): CORONARY ARTERY BYPASS GRAFTING (CABG) (N/A) ENDARTERECTOMY CAROTID (Right) Primary limiting factor now is the patient's inability to pass a swallowing test, and with multiple attempts unable to pass a duodenal feeding tube. We will continue to TNA, until he is able to pass a swallowing evaluation.     Delight Ovens MD  Beeper [phone] Office [phone] 11/10/2011 12:48 PM

## 2011-11-10 NOTE — Progress Notes (Signed)
PARENTERAL NUTRITION CONSULT NOTE - INITIAL  Pharmacy Consult for TPN Indication: intolerance to enteral feed/unsuccessful panda placement  No Known Allergies  Patient Measurements: Height: 6' 0.83" (185 cm) Weight: 194 lb 3.6 oz (88.1 kg) IBW/kg (Calculated) : 79.52   Vital Signs: Temp: 98.2 F (36.8 C) (07/01 0801) Temp src: Oral (07/01 0801) BP: 166/65 mmHg (07/01 0900) Pulse Rate: 68  (07/01 0900) Intake/Output from previous day: 06/30 0701 - 07/01 0700 In: 1617.5 [I.V.:1617.5] Out: 2035 [Urine:2035] Intake/Output from this shift: Total I/O In: 183.4 [I.V.:133.4; IV Piggyback:50] Out: -   Labs:  Basename 11/09/11 0421 11/08/11 0400  WBC 10.3 9.3  HGB 9.1* 9.0*  HCT 28.4* 27.9*  PLT 324 268  APTT -- --  INR -- --     Basename 11/10/11 0430 11/09/11 0421 11/08/11 0400  NA 143 144 143  K 3.5 3.5 3.4*  CL 102 102 101  CO2 30 28 28   GLUCOSE 113* 99 99  BUN 42* 42* 45*  CREATININE 1.71* 1.58* 1.86*  LABCREA -- -- --  CREAT24HRUR -- -- --  CALCIUM 8.9 9.1 9.2  MG 2.4 -- 2.5  PHOS 5.1* -- 3.6  PROT 5.9* 6.0 --  ALBUMIN 2.6* 2.7* --  AST 64* 39* --  ALT 66* 48 --  ALKPHOS 106 122* --  BILITOT 1.3* 1.4* --  BILIDIR -- -- --  IBILI -- -- --  PREALBUMIN -- -- --  TRIG -- -- --  CHOLHDL -- -- --  CHOL -- -- --   Estimated Creatinine Clearance: 43.9 ml/min (by C-G formula based on Cr of 1.71).    Basename 11/10/11 0756 11/10/11 0431 11/09/11 2357  GLUCAP 94 89 98    Medical History: Past Medical History  Diagnosis Date  . Aorto-iliac disease     bilat with stent 2007  . CAD (coronary artery disease)     with loop to the circumflex 2000, inferolateral infarction  . Hypertension   . Paroxysmal atrial flutter     ablation  . ED (erectile dysfunction)   . COPD (chronic obstructive pulmonary disease)   . Hyperlipidemia   . Stroke   . Shortness of breath   . GERD (gastroesophageal reflux disease)   . Gout     Medications:  Scheduled:    .  allopurinol  300 mg Oral Daily  . antiseptic oral rinse  15 mL Mouth Rinse QID  . aspirin  300 mg Rectal Daily  . bisacodyl  10 mg Rectal Daily  . budesonide  0.25 mg Nebulization Q6H  . chlorhexidine  15 mL Mouth Rinse BID  . enoxaparin (LOVENOX) injection  30 mg Subcutaneous Q24H  . furosemide  40 mg Intravenous Once  . insulin aspart  0-24 Units Subcutaneous Q4H  . levalbuterol  0.63 mg Nebulization Q6H  . metoprolol  5 mg Intravenous Q6H  . pantoprazole (PROTONIX) IV  40 mg Intravenous QHS  . potassium chloride  10 mEq Intravenous Once  . sodium chloride  10-40 mL Intracatheter Q12H  . DISCONTD: aspirin  324 mg Per Tube Daily  . DISCONTD: aspirin EC  325 mg Oral Daily  . DISCONTD: bisacodyl  10 mg Oral Daily  . DISCONTD: buPROPion  150 mg Oral BID  . DISCONTD: docusate  200 mg Per Tube Daily  . DISCONTD: insulin regular  0-10 Units Intravenous TID WC  . DISCONTD: metoprolol  5 mg Intravenous Q6H  . DISCONTD: metoprolol tartrate  25 mg Oral BID  . DISCONTD: sodium chloride  3 mL Intravenous Q12H    Insulin Requirements in the past 24 hours:  None. SSI ordered  Current Nutrition:  none  Assessment: 71 y/o male patient s/p CABG, POD#10 with dysphagia, unsuccessful panda placement. Patient had PICC line placed for anticipation of TPN to meet nutritional needs.   Anticoagulation: lovenox 30mg  daily, lower dose d/t bleeding risk, could potentially increase to 40mg  daily when stable Infectious Disease: afebrile; wbc wnl Cardiovascular: h/o CAD/PVD?HTN; s/p CABG, bp 166/65, nsr, on amio at 30mg /hr and lopressor 5mg  q6h Endocrinology: cbg <150 , no h/o DM Gastrointestinal / Nutrition: h/o GERD; will add pepcid as patient takes prilosec at home, d/c protonix; post op dysphagia, unsuccessful panda placement. Start tpn today. Noted elevated lft, will watch Neurology: no pain reported Nephrology: elevated scr and phos. No h/o ckd, k borderline low, will give 2 runs kcl Pulmonary:  COPD; 94% on ra; on pulmicort and xopenex Hematology / Oncology: h/h low, post op abla PTA Medication Issues: home meds not resumed d/t po status Best Practices: lovenox for dvt px, h2ra for gi px   Nutritional Goals:  2100-2300 kCal, 100-110 grams of protein per day  Plan:  Will begin Clinimix E 5/15 at 25ml/hr per protocol to monitor if patient tolerates TPN. This rate only provides patient with half of nutritional requiremnts. Intralipid 10 ml/hr on MWF only due to Sport and exercise psychologist. Trace elements on MWF only due to Sport and exercise psychologist. Decrease IVF to [facility] when TPN hung. Change SSI to Q6.  TPN labs in AM  Verlene Mayer, PharmD, [place] Pager [phone] 11/10/2011,9:36 AM

## 2011-11-10 NOTE — Telephone Encounter (Addendum)
Message copied by Shari Prows on Mon Nov 10, 2011  4:45 PM ------      Message from: Melene Plan      Created: Mon Nov 10, 2011 12:43 PM                   ----- Message -----         From: Marlowe Shores, PA         Sent: 11/08/2011   9:26 AM           To: Melene Plan, RN            Pt needs 2-3 week f/u with [facility] - Right CEA/cabg  I scheduled an appt for the above pt for 12/01/11 at 10:30am. i left message for the patient and also mailed a letter to pt's home. Jacklyn Shell

## 2011-11-10 NOTE — Progress Notes (Signed)
Nutrition Follow-up  Intervention:    TPN per pharmacy RD to follow for nutrition care plan  [name] tube placement unsuccessful under fluoroscopy (multiple attempts). PICC line placed 6/29. TPN to start tonight -- to receive Clinimix E 5/15 @ 40 ml/hr.  Lipids (20% IVFE @ 10.4 ml/hr), multivitamins, and trace elements are provided 3 times weekly (MWF) due to national backorder.  Provides 895 kcal and 48 grams protein daily (based on weekly average).  Meets 43% minimum estimated kcal and 48% minimum estimated protein needs.  Diet Order:  NPO  Meds: Scheduled Meds:   . allopurinol  300 mg Oral Daily  . antiseptic oral rinse  15 mL Mouth Rinse QID  . aspirin  300 mg Rectal Daily  . bisacodyl  10 mg Rectal Daily  . budesonide  0.25 mg Nebulization Q6H  . chlorhexidine  15 mL Mouth Rinse BID  . enoxaparin (LOVENOX) injection  30 mg Subcutaneous Q24H  . insulin aspart  0-9 Units Subcutaneous Q4H  . levalbuterol  0.63 mg Nebulization Q6H  . metoprolol  5 mg Intravenous Q6H  . potassium chloride  10 mEq Intravenous Once  . potassium chloride  10 mEq Intravenous Q1 Hr x 2  . potassium chloride      . sodium chloride  10-40 mL Intracatheter Q12H  . DISCONTD: aspirin  324 mg Per Tube Daily  . DISCONTD: aspirin EC  325 mg Oral Daily  . DISCONTD: bisacodyl  10 mg Oral Daily  . DISCONTD: buPROPion  150 mg Oral BID  . DISCONTD: docusate  200 mg Per Tube Daily  . DISCONTD: insulin aspart  0-24 Units Subcutaneous Q4H  . DISCONTD: insulin regular  0-10 Units Intravenous TID WC  . DISCONTD: metoprolol tartrate  25 mg Oral BID  . DISCONTD: pantoprazole (PROTONIX) IV  40 mg Intravenous QHS  . DISCONTD: sodium chloride  3 mL Intravenous Q12H   Continuous Infusions:   . sodium chloride    . amiodarone (NEXTERONE PREMIX) 360 mg/200 mL dextrose 30 mg/hr (11/10/11 1200)  . TPN (CLINIMIX) +/- additives     And  . fat emulsion    . DISCONTD: sodium chloride 50 mL/hr at 11/08/11 1749  . DISCONTD:  ADULT TPN    . DISCONTD: feeding supplement (JEVITY 1.2 CAL) 1,000 mL (11/07/11 1700)   PRN Meds:.haloperidol lactate, labetalol, metoprolol, morphine injection, ondansetron (ZOFRAN) IV, oxyCODONE, potassium chloride, sodium chloride, DISCONTD: sodium chloride  Labs:  CMP     Component Value Date/Time   NA 143 11/10/2011 0430   K 3.5 11/10/2011 0430   CL 102 11/10/2011 0430   CO2 30 11/10/2011 0430   GLUCOSE 113* 11/10/2011 0430   BUN 42* 11/10/2011 0430   CREATININE 1.71* 11/10/2011 0430   CREATININE 1.58* 09/18/2011 1224   CALCIUM 8.9 11/10/2011 0430   PROT 5.9* 11/10/2011 0430   ALBUMIN 2.6* 11/10/2011 0430   AST 64* 11/10/2011 0430   ALT 66* 11/10/2011 0430   ALKPHOS 106 11/10/2011 0430   BILITOT 1.3* 11/10/2011 0430   GFRNONAA 38* 11/10/2011 0430   GFRAA 44* 11/10/2011 0430     Intake/Output Summary (Last 24 hours) at 11/10/11 1243 Last data filed at 11/10/11 1200  Gross per 24 hour  Intake 1637.5 ml  Output   1850 ml  Net -212.5 ml    CBG (last 3)   Basename 11/10/11 1131 11/10/11 0756 11/10/11 0431  GLUCAP 104* 94 89    Weight Status:  88.1 kg (7/1) -- trending down  Re-estimated needs:  2100-2300 kcals, 100-110 gm protein  Nutrition Dx:  Inadequate Oral Intake, ongoing  New Goal:  TPN to meet >90% of estimated protein needs, maximize energy provision as able during national lipid backorder, progressing  Monitor:  TPN prescription, weight, labs, I/O's  Alger Memos, RD, LDN Pager #: [phone]

## 2011-11-11 LAB — DIFFERENTIAL
Basophils Absolute: 0 10*3/uL (ref 0.0–0.1)
Basophils Relative: 0 % (ref 0–1)
Eosinophils Absolute: 1.3 10*3/uL — ABNORMAL HIGH (ref 0.0–0.7)
Eosinophils Relative: 9 % — ABNORMAL HIGH (ref 0–5)
Lymphocytes Relative: 11 % — ABNORMAL LOW (ref 12–46)
Lymphs Abs: 1.6 10*3/uL (ref 0.7–4.0)
Monocytes Absolute: 0.9 10*3/uL (ref 0.1–1.0)
Monocytes Relative: 7 % (ref 3–12)
Neutro Abs: 10.3 10*3/uL — ABNORMAL HIGH (ref 1.7–7.7)
Neutrophils Relative %: 73 % (ref 43–77)

## 2011-11-11 LAB — COMPREHENSIVE METABOLIC PANEL
ALT: 73 U/L — ABNORMAL HIGH (ref 0–53)
AST: 45 U/L — ABNORMAL HIGH (ref 0–37)
Albumin: 2.5 g/dL — ABNORMAL LOW (ref 3.5–5.2)
Alkaline Phosphatase: 95 U/L (ref 39–117)
BUN: 39 mg/dL — ABNORMAL HIGH (ref 6–23)
CO2: 29 mEq/L (ref 19–32)
Calcium: 8.9 mg/dL (ref 8.4–10.5)
Chloride: 105 mEq/L (ref 96–112)
Creatinine, Ser: 1.52 mg/dL — ABNORMAL HIGH (ref 0.50–1.35)
GFR calc Af Amer: 51 mL/min — ABNORMAL LOW (ref >90)
GFR calc non Af Amer: 44 mL/min — ABNORMAL LOW (ref >90)
Glucose, Bld: 111 mg/dL — ABNORMAL HIGH (ref 70–99)
Potassium: 3.5 mEq/L (ref 3.5–5.1)
Sodium: 144 mEq/L (ref 135–145)
Total Bilirubin: 1.2 mg/dL (ref 0.3–1.2)
Total Protein: 5.6 g/dL — ABNORMAL LOW (ref 6.0–8.3)

## 2011-11-11 LAB — GLUCOSE, CAPILLARY
Glucose-Capillary: 104 mg/dL — ABNORMAL HIGH (ref 70–99)
Glucose-Capillary: 105 mg/dL — ABNORMAL HIGH (ref 70–99)
Glucose-Capillary: 106 mg/dL — ABNORMAL HIGH (ref 70–99)
Glucose-Capillary: 107 mg/dL — ABNORMAL HIGH (ref 70–99)
Glucose-Capillary: 112 mg/dL — ABNORMAL HIGH (ref 70–99)
Glucose-Capillary: 85 mg/dL (ref 70–99)

## 2011-11-11 LAB — CBC
HCT: 28.5 % — ABNORMAL LOW (ref 39.0–52.0)
Hemoglobin: 9.1 g/dL — ABNORMAL LOW (ref 13.0–17.0)
MCH: 29.1 pg (ref 26.0–34.0)
MCHC: 31.9 g/dL (ref 30.0–36.0)
MCV: 91.1 fL (ref 78.0–100.0)
Platelets: 355 10*3/uL (ref 150–400)
RBC: 3.13 MIL/uL — ABNORMAL LOW (ref 4.22–5.81)
RDW: 16.6 % — ABNORMAL HIGH (ref 11.5–15.5)
WBC: 14.2 10*3/uL — ABNORMAL HIGH (ref 4.0–10.5)

## 2011-11-11 LAB — TRIGLYCERIDES: Triglycerides: 159 mg/dL — ABNORMAL HIGH (ref <150)

## 2011-11-11 LAB — CHOLESTEROL, TOTAL: Cholesterol: 104 mg/dL (ref 0–200)

## 2011-11-11 LAB — PHOSPHORUS: Phosphorus: 3.7 mg/dL (ref 2.3–4.6)

## 2011-11-11 LAB — PREALBUMIN: Prealbumin: 17.2 mg/dL — ABNORMAL LOW (ref 17.0–34.0)

## 2011-11-11 LAB — MAGNESIUM: Magnesium: 2.3 mg/dL (ref 1.5–2.5)

## 2011-11-11 MED ORDER — CLINIMIX E/DEXTROSE (5/15) 5 % IV SOLN
INTRAVENOUS | Status: DC
  Filled 2011-11-11: qty 2000

## 2011-11-11 MED ORDER — FAMOTIDINE 10 MG/ML IV SOLN
INTRAVENOUS | Status: AC
  Administered 2011-11-11: 17:00:00 via INTRAVENOUS
  Filled 2011-11-11: qty 2000

## 2011-11-11 MED ORDER — SODIUM CHLORIDE 0.9 % IJ SOLN: 3.0000 mL | INTRAMUSCULAR | Status: DC | PRN

## 2011-11-11 MED ORDER — MOVING RIGHT ALONG BOOK
Freq: Once | Status: AC
  Administered 2011-11-11: 09:00:00
  Filled 2011-11-11: qty 1

## 2011-11-11 MED ORDER — POTASSIUM CHLORIDE 10 MEQ/50ML IV SOLN
INTRAVENOUS | Status: AC
  Filled 2011-11-11: qty 50

## 2011-11-11 MED ORDER — POTASSIUM CHLORIDE 10 MEQ/50ML IV SOLN
INTRAVENOUS | Status: AC
  Administered 2011-11-11: 10 meq
  Filled 2011-11-11: qty 50

## 2011-11-11 MED ORDER — POTASSIUM CHLORIDE 10 MEQ/50ML IV SOLN: 10.0000 meq | INTRAVENOUS | Status: AC

## 2011-11-11 MED ORDER — SODIUM CHLORIDE 0.9 % IV SOLN: 250.0000 mL | INTRAVENOUS | Status: DC | PRN

## 2011-11-11 MED ORDER — POTASSIUM CHLORIDE 10 MEQ/50ML IV SOLN
10.0000 meq | INTRAVENOUS | Status: DC | PRN
  Administered 2011-11-11: 10 meq via INTRAVENOUS

## 2011-11-11 MED ORDER — SODIUM CHLORIDE 0.9 % IJ SOLN: 3.0000 mL | Freq: Two times a day (BID) | INTRAMUSCULAR | Status: DC

## 2011-11-11 MED ORDER — LEVALBUTEROL HCL 0.63 MG/3ML IN NEBU
0.6300 mg | INHALATION_SOLUTION | Freq: Four times a day (QID) | RESPIRATORY_TRACT | Status: DC | PRN
  Filled 2011-11-11: qty 3

## 2011-11-11 NOTE — Progress Notes (Signed)
PARENTERAL NUTRITION CONSULT NOTE - FOLLOW UP  Pharmacy Consult for TPN Indication: intolerance to enteral feed/unsuccessful panda placement  No Known Allergies  Patient Measurements: Height: 6' 0.83" (185 cm) Weight: 187 lb 2.7 oz (84.9 kg) IBW/kg (Calculated) : 79.52   Vital Signs: Temp: 97.9 F (36.6 C) (07/02 0823) Temp src: Oral (07/02 0823) BP: 150/63 mmHg (07/02 0900) Pulse Rate: 70  (07/02 0900) Intake/Output from previous day: 07/01 0701 - 07/02 0700 In: 1828.7 [I.V.:930.8; IV Piggyback:200; TPN:697.9] Out: 1100 [Urine:1100] Intake/Output from this shift: Total I/O In: 174.2 [I.V.:73.4; TPN:100.8] Out: -   Labs:  Basename 11/11/11 0400 11/09/11 0421  WBC 14.2* 10.3  HGB 9.1* 9.1*  HCT 28.5* 28.4*  PLT 355 324  APTT -- --  INR -- --     Basename 11/11/11 0400 11/10/11 0430 11/09/11 0421  NA 144 143 144  K 3.5 3.5 3.5  CL 105 102 102  CO2 29 30 28   GLUCOSE 111* 113* 99  BUN 39* 42* 42*  CREATININE 1.52* 1.71* 1.58*  LABCREA -- -- --  CREAT24HRUR -- -- --  CALCIUM 8.9 8.9 9.1  MG 2.3 2.4 --  PHOS 3.7 5.1* --  PROT 5.6* 5.9* 6.0  ALBUMIN 2.5* 2.6* 2.7*  AST 45* 64* 39*  ALT 73* 66* 48  ALKPHOS 95 106 122*  BILITOT 1.2 1.3* 1.4*  BILIDIR -- -- --  IBILI -- -- --  PREALBUMIN -- -- --  TRIG 159* -- --  CHOLHDL -- -- --  CHOL 104 -- --   Estimated Creatinine Clearance: 49.4 ml/min (by C-G formula based on Cr of 1.52).    Basename 11/11/11 0819 11/11/11 0404 11/10/11 2344  GLUCAP 105* 104* 85    Medical History: Past Medical History  Diagnosis Date  . Aorto-iliac disease     bilat with stent 2007  . CAD (coronary artery disease)     with loop to the circumflex 2000, inferolateral infarction  . Hypertension   . Paroxysmal atrial flutter     ablation  . ED (erectile dysfunction)   . COPD (chronic obstructive pulmonary disease)   . Hyperlipidemia   . Stroke   . Shortness of breath   . GERD (gastroesophageal reflux disease)   . Gout      Medications:  Scheduled:     . antiseptic oral rinse  15 mL Mouth Rinse QID  . aspirin  300 mg Rectal Daily  . budesonide  0.25 mg Nebulization Q6H  . chlorhexidine  15 mL Mouth Rinse BID  . enoxaparin (LOVENOX) injection  30 mg Subcutaneous Q24H  . insulin aspart  0-9 Units Subcutaneous Q4H  . levalbuterol  0.63 mg Nebulization Q6H  . metoprolol  5 mg Intravenous Q6H  . moving right along book   Does not apply Once  . potassium chloride  10 mEq Intravenous Q1 Hr x 2  . potassium chloride      . potassium chloride      . potassium chloride      . sodium chloride  10-40 mL Intracatheter Q12H  . sodium chloride  3 mL Intravenous Q12H  . DISCONTD: allopurinol  300 mg Oral Daily  . DISCONTD: bisacodyl  10 mg Rectal Daily  . DISCONTD: insulin aspart  0-24 Units Subcutaneous Q4H  . DISCONTD: pantoprazole (PROTONIX) IV  40 mg Intravenous QHS    Insulin Requirements in the past 24 hours:  2 units Novolog  Current Nutrition:  npo  Assessment: 71 y/o male patient s/p CABG,  POD#11 with dysphagia, unsuccessful panda placement. Patient had PICC line placed and started tpn last pm. Clinimix started at 64ml/hr, which patient tolerated. CBG <150. Prealbumin pending.  Anticoagulation: lovenox 30mg  daily, lower dose d/t bleeding risk, could potentially increase to 40mg  daily when stable Infectious Disease: afebrile; wbc wnl Cardiovascular: h/o CAD/PVD?HTN; s/p CABG, bp 150/63, nsr, on amio at 30mg /hr and lopressor 5mg  q6h Endocrinology: cbg <150 , no h/o DM Gastrointestinal / Nutrition: h/o GERD; will add pepcid as patient takes prilosec at home, d/c protonix; post op dysphagia, unsuccessful panda placement.  Noted elevated lft, dec today,  will watch Neurology: no pain reported Nephrology: elevated scr and phos but trend down today. No h/o ckd, k borderline low again today, will give 2 runs kcl Pulmonary: COPD; 94% on ra; on pulmicort and xopenex Hematology / Oncology: h/h low, post op  abla PTA Medication Issues: home meds not resumed d/t po status Best Practices: lovenox for dvt px, h2ra for gi px   Nutritional Goals:  2100-2300 kCal, 100-110 grams of protein per day  Plan:  Will increase Clinimix E 5/15 to goal 43ml/h and monitor if patient tolerates TPN. KCL x2 runs Intralipid 10 ml/hr on MWF only due to [employer]. Trace elements on MWF only due to Sport and exercise psychologist. Cont IVF at [facility]. Cont SSI at Q6.  TPN labs in AM  Verlene Mayer, PharmD, [place] Pager [phone] 11/11/2011,9:28 AM

## 2011-11-11 NOTE — Progress Notes (Signed)
Patient ID: Nathan [name], male   DOB: 1940/11/27, 71 y.o.   MRN: [id number] TCTS DAILY PROGRESS NOTE                   [address]            [place] [id number]          [phone]      11 Days Post-Op Procedure(s) (LRB): CORONARY ARTERY BYPASS GRAFTING (CABG) (N/A) ENDARTERECTOMY CAROTID (Right)  Total Length of Stay:  LOS: 11 days   Subjective: Alert and talkative, walking in unit well  Objective: Vital signs in last 24 hours: Temp:  [97.9 F (36.6 C)-98.7 F (37.1 C)] 97.9 F (36.6 C) (07/02 0823) Pulse Rate:  [62-83] 67  (07/02 0700) Cardiac Rhythm:  [-] Normal sinus rhythm (07/02 0000) Resp:  [18-32] 28  (07/02 0700) BP: (132-170)/(55-89) 156/64 mmHg (07/02 0700) SpO2:  [90 %-100 %] 94 % (07/02 0700) Weight:  [187 lb 2.7 oz (84.9 kg)] 187 lb 2.7 oz (84.9 kg) (07/02 0600)  Filed Weights   11/09/11 0600 11/10/11 0600 11/11/11 0600  Weight: 190 lb 4.1 oz (86.3 kg) 194 lb 3.6 oz (88.1 kg) 187 lb 2.7 oz (84.9 kg)    Weight change: -7 lb 0.9 oz (-3.2 kg)   Hemodynamic parameters for last 24 hours:    Intake/Output from previous day: 07/01 0701 - 07/02 0700 In: 1828.7 [I.V.:930.8; IV Piggyback:200; TPN:697.9] Out: 1100 [Urine:1100]  Intake/Output this shift:    Current Meds: Scheduled Meds:   . antiseptic oral rinse  15 mL Mouth Rinse QID  . aspirin  300 mg Rectal Daily  . budesonide  0.25 mg Nebulization Q6H  . chlorhexidine  15 mL Mouth Rinse BID  . enoxaparin (LOVENOX) injection  30 mg Subcutaneous Q24H  . insulin aspart  0-9 Units Subcutaneous Q4H  . levalbuterol  0.63 mg Nebulization Q6H  . metoprolol  5 mg Intravenous Q6H  . moving right along book   Does not apply Once  . potassium chloride  10 mEq Intravenous Q1 Hr x 2  . potassium chloride      . potassium chloride      . potassium chloride      . sodium chloride  10-40 mL Intracatheter Q12H  . sodium chloride  3 mL Intravenous Q12H  Continuous Infusions:   . amiodarone  (NEXTERONE PREMIX) 360 mg/200 mL dextrose 30 mg/hr (11/11/11 0341)  . TPN (CLINIMIX) +/- additives 40 mL/hr at 11/10/11 2000   And  . fat emulsion 100 mL (11/10/11 2000)  . DISCONTD: sodium chloride 50 mL/hr at 11/08/11 1749  . DISCONTD: sodium chloride 20 mL/hr at 11/10/11 2000   PRN Meds:.sodium chloride, labetalol, morphine injection, ondansetron (ZOFRAN) IV, potassium chloride, sodium chloride, sodium chloride, DISCONTD: haloperidol lactate, DISCONTD: metoprolol, DISCONTD: oxyCODONE, DISCONTD: potassium chloride  General appearance: alert, cooperative and no distress Neurologic: intact Heart: regular rate and rhythm, S1, S2 normal, no murmur, click, rub or gallop and normal apical impulse Lungs: clear to auscultation bilaterally Abdomen: soft, non-tender; bowel sounds normal; no masses,  no organomegaly Extremities: extremities normal, atraumatic, no cyanosis or edema and Homans sign is negative, no sign of DVT Wound: sternum stable and wound well healed  Lab Results: CBC: Basename 11/11/11 0400 11/09/11 0421  WBC 14.2* 10.3  HGB 9.1* 9.1*  HCT 28.5* 28.4*  PLT 355 324   BMET:  Basename 11/11/11 0400 11/10/11 0430  NA 144 143  K 3.5 3.5  CL 105 102  CO2 29 30  GLUCOSE 111* 113*  BUN 39* 42*  CREATININE 1.52* 1.71*  CALCIUM 8.9 8.9    PT/INR: No results found for this basename: LABPROT,INR in the last 72 hours Radiology: Dg Basil Dess Tube [facility] W/fl W/rad  11/09/2011  *RADIOLOGY REPORT*  Clinical Data: Needs feeding tube.  NASO G TUEB PLACEMENT WITH FL AND WITH RAD  Comparison:  11/07/2011  Findings: Multiple attempts to pass a feeding tube with into the stomach were performed without success.  IMPRESSION: Despite multiple attempts we were unable to pass a feeding tube into the stomach under direct fluoroscopic guidance  Original Report Authenticated By: Rosealee Albee, M.D.     Assessment/Plan: S/P Procedure(s) (LRB): CORONARY ARTERY BYPASS GRAFTING (CABG)  (N/A) ENDARTERECTOMY CAROTID (Right) Mobilize Plan for transfer to step-down: see transfer orders Follow up swallow evaluation later this week  Mild leucocytosis   Delight Ovens MD  Beeper [phone] Office [phone] 11/11/2011 8:23 AM

## 2011-11-11 NOTE — Progress Notes (Signed)
Physical Therapy Treatment Patient Details Name: Nathan [name] MRN: [id number] DOB: [birth year] Today's Date: 11/11/2011 Time: 4098-1191 PT Time Calculation (min): 16 min  PT Assessment / Plan / Recommendation Comments on Treatment Session  Pt s/p CABG and right CEA who continues to progress very well with mobility, ambulation and exercise without RW today. Pt educated for sternal precautions and encouraged to continue ambulation with nursing staff.     Follow Up Recommendations       Barriers to Discharge        Equipment Recommendations       Recommendations for Other Services    Frequency     Plan Discharge plan remains appropriate;Frequency remains appropriate    Precautions / Restrictions Precautions Precautions: Sternal   Pertinent Vitals/Pain HR 68-80 throughout    Mobility  Bed Mobility Bed Mobility: Not assessed Transfers Transfers: Sit to Stand;Stand to Sit Sit to Stand: 5: Supervision;From chair/3-in-1 Stand to Sit: 5: Supervision;To chair/3-in-1 Details for Transfer Assistance: cueing for hand placement due to precautions Ambulation/Gait Ambulation/Gait Assistance: 5: Supervision Ambulation Distance (Feet): 300 Feet Assistive device: None Ambulation/Gait Assistance Details: Pt with good stride and speed however end of gait with one posterior LOB requiring min assist to correct Gait Pattern: Within Functional Limits Stairs: No    Exercises General Exercises - Lower Extremity Long Arc Quad: AROM;Both;20 reps Hip Flexion/Marching: AROM;Both;20 reps   PT Diagnosis:    PT Problem List:   PT Treatment Interventions:     PT Goals Acute Rehab PT Goals PT Goal: Sit to Stand - Progress: Progressing toward goal PT Goal: Stand to Sit - Progress: Progressing toward goal PT Goal: Ambulate - Progress: Progressing toward goal PT Goal: Up/Down Stairs - Progress: Progressing toward goal Additional Goals PT Goal: Additional Goal #1 - Progress: Progressing toward  goal  Visit Information  Last PT Received On: 11/11/11 Assistance Needed: +1    Subjective Data  Subjective: I guess that is better without the walker   Cognition  Overall Cognitive Status: Appears within functional limits for tasks assessed/performed Area of Impairment: Memory Arousal/Alertness: Awake/alert Orientation Level: Appears intact for tasks assessed Behavior During Session: Nathan [name] for tasks performed Memory: Decreased recall of precautions Memory Deficits: Pt able to recall 3 sternal precautions and educated for all     Balance     End of Session PT - End of Session Equipment Utilized During Treatment: Gait belt Activity Tolerance: Patient tolerated treatment well Patient left: in chair;with call bell/phone within reach Nurse Communication: Mobility status   GP     Nathan [name] 11/11/2011, 9:21 AM Nathan [name], PT [phone]

## 2011-11-11 NOTE — Progress Notes (Signed)
Rt assessment done at this time. I don't feel as if he is benefiting from the xopenex q6, he has been clear for 2 days. Chest xray didn't show infiltrates or edema. He is currently on RA, SAT 96%. I believe that his Pulmicort should be changed to BID, but I need the MD to change this for [place]. PLEASE ADDRESS. RT will continue to monitor.

## 2011-11-11 NOTE — Progress Notes (Signed)
Patient ambulated 350' on room air with monitor.  Tolerated well.

## 2011-11-12 LAB — GLUCOSE, CAPILLARY
Glucose-Capillary: 115 mg/dL — ABNORMAL HIGH (ref 70–99)
Glucose-Capillary: 138 mg/dL — ABNORMAL HIGH (ref 70–99)
Glucose-Capillary: 108 mg/dL — ABNORMAL HIGH (ref 70–99)
Glucose-Capillary: 128 mg/dL — ABNORMAL HIGH (ref 70–99)
Glucose-Capillary: 112 mg/dL — ABNORMAL HIGH (ref 70–99)
Glucose-Capillary: 112 mg/dL — ABNORMAL HIGH (ref 70–99)

## 2011-11-12 LAB — CBC
HCT: 28.9 % — ABNORMAL LOW (ref 39.0–52.0)
Hemoglobin: 9.1 g/dL — ABNORMAL LOW (ref 13.0–17.0)
MCH: 29.1 pg (ref 26.0–34.0)
MCHC: 31.5 g/dL (ref 30.0–36.0)
MCV: 92.3 fL (ref 78.0–100.0)
Platelets: 361 10*3/uL (ref 150–400)
RBC: 3.13 MIL/uL — ABNORMAL LOW (ref 4.22–5.81)
RDW: 17.3 % — ABNORMAL HIGH (ref 11.5–15.5)
WBC: 13.3 10*3/uL — ABNORMAL HIGH (ref 4.0–10.5)

## 2011-11-12 LAB — BASIC METABOLIC PANEL
BUN: 36 mg/dL — ABNORMAL HIGH (ref 6–23)
CO2: 29 mEq/L (ref 19–32)
Calcium: 8.8 mg/dL (ref 8.4–10.5)
Chloride: 106 mEq/L (ref 96–112)
Creatinine, Ser: 1.65 mg/dL — ABNORMAL HIGH (ref 0.50–1.35)
GFR calc Af Amer: 46 mL/min — ABNORMAL LOW (ref >90)
GFR calc non Af Amer: 40 mL/min — ABNORMAL LOW (ref >90)
Glucose, Bld: 114 mg/dL — ABNORMAL HIGH (ref 70–99)
Potassium: 3.7 mEq/L (ref 3.5–5.1)
Sodium: 144 mEq/L (ref 135–145)

## 2011-11-12 MED ORDER — ALTEPLASE 2 MG IJ SOLR
2.0000 mg | Freq: Once | INTRAMUSCULAR | Status: AC
  Administered 2011-11-12: 2 mg
  Filled 2011-11-12: qty 2

## 2011-11-12 MED ORDER — MANGANESE SULFATE 0.1 MG/ML IV SOLN
INTRAVENOUS | Status: AC
  Administered 2011-11-12: 18:00:00 via INTRAVENOUS
  Filled 2011-11-12: qty 2000

## 2011-11-12 MED ORDER — FAT EMULSION 20 % IV EMUL
250.0000 mL | INTRAVENOUS | Status: AC
  Administered 2011-11-12: 250 mL via INTRAVENOUS
  Filled 2011-11-12: qty 250

## 2011-11-12 NOTE — Progress Notes (Signed)
PARENTERAL NUTRITION CONSULT NOTE - FOLLOW UP  Pharmacy Consult for TPN Indication: intolerance to enteral feed/unsuccessful panda placement  No Known Allergies  Patient Measurements: Height: 6' 0.83" (185 cm) Weight: 187 lb 2.7 oz (84.9 kg) IBW/kg (Calculated) : 79.52   Vital Signs: Temp: 97.9 F (36.6 C) (07/03 0417) Temp src: Oral (07/03 0417) BP: 160/70 mmHg (07/03 0547) Pulse Rate: 68  (07/03 0417) Intake/Output from previous day: 07/02 0701 - 07/03 0700 In: 803.9 [I.V.:350.3; TPN:453.6] Out: 350 [Urine:350] Intake/Output from this shift:    Labs:  Basename 11/12/11 0520 11/11/11 0400  WBC 13.3* 14.2*  HGB 9.1* 9.1*  HCT 28.9* 28.5*  PLT 361 355  APTT -- --  INR -- --     Basename 11/12/11 0520 11/11/11 0400 11/10/11 0430  NA 144 144 143  K 3.7 3.5 3.5  CL 106 105 102  CO2 29 29 30   GLUCOSE 114* 111* 113*  BUN 36* 39* 42*  CREATININE 1.65* 1.52* 1.71*  LABCREA -- -- --  CREAT24HRUR -- -- --  CALCIUM 8.8 8.9 8.9  MG -- 2.3 2.4  PHOS -- 3.7 5.1*  PROT -- 5.6* 5.9*  ALBUMIN -- 2.5* 2.6*  AST -- 45* 64*  ALT -- 73* 66*  ALKPHOS -- 95 106  BILITOT -- 1.2 1.3*  BILIDIR -- -- --  IBILI -- -- --  PREALBUMIN -- 17.2* --  TRIG -- 159* --  CHOLHDL -- -- --  CHOL -- 104 --   Estimated Creatinine Clearance: 45.5 ml/min (by C-G formula based on Cr of 1.65).    Basename 11/12/11 0405 11/12/11 0003 11/11/11 2018  GLUCAP 138* 115* 112*    Medical History: Past Medical History  Diagnosis Date  . Aorto-iliac disease     bilat with stent 2007  . CAD (coronary artery disease)     with loop to the circumflex 2000, inferolateral infarction  . Hypertension   . Paroxysmal atrial flutter     ablation  . ED (erectile dysfunction)   . COPD (chronic obstructive pulmonary disease)   . Hyperlipidemia   . Stroke   . Shortness of breath   . GERD (gastroesophageal reflux disease)   . Gout     Medications:  Scheduled:     . alteplase  2 mg Intracatheter  Once  . antiseptic oral rinse  15 mL Mouth Rinse QID  . aspirin  300 mg Rectal Daily  . budesonide  0.25 mg Nebulization Q6H  . chlorhexidine  15 mL Mouth Rinse BID  . enoxaparin (LOVENOX) injection  30 mg Subcutaneous Q24H  . insulin aspart  0-9 Units Subcutaneous Q4H  . metoprolol  5 mg Intravenous Q6H  . moving right along book   Does not apply Once  . potassium chloride  10 mEq Intravenous Q1 Hr x 2  . potassium chloride      . potassium chloride      . sodium chloride  10-40 mL Intracatheter Q12H  . sodium chloride  3 mL Intravenous Q12H  . DISCONTD: allopurinol  300 mg Oral Daily  . DISCONTD: bisacodyl  10 mg Rectal Daily  . DISCONTD: levalbuterol  0.63 mg Nebulization Q6H    Insulin Requirements in the past 24 hours:  1 units Novolog  Current Nutrition:  npo  Assessment: 71 y/o male patient s/p CABG, POD#12 with dysphagia, unsuccessful panda placement. Patient had PICC line placed and started tpn 7/1.   Clinimix at 80/ml/hr which patient tolerated. CBG <150. Prealbumin 17.2.  Goal  rate 90 ml/hr will provide 108 gm protein/day.  TPN and lipids at 10 ml/hr MWF will provide an average 1569 Kcal/day.  Anticoagulation: lovenox 30mg  daily, lower dose d/t bleeding risk, could potentially increase to 40mg  daily when stable Infectious Disease: afebrile; wbc 13.3 Cardiovascular: h/o CAD/PVD?HTN; s/p CABG, bp 160/70, nsr, on amio at 30mg /hr and lopressor 5mg  q6h Endocrinology: cbg <150 , no h/o DM Gastrointestinal / Nutrition: h/o GERD; will add pepcid as patient takes prilosec at home, d/c protonix; post op dysphagia, unsuccessful panda placement.  Noted elevated lft, dec today,  will watch Neurology: no pain reported Nephrology: K 3.7, SrCr 1.65 Pulmonary: COPD; 94% on ra; on pulmicort and xopenex Hematology / Oncology: h/h low, post op abla PTA Medication Issues: home meds not resumed d/t po status Best Practices: lovenox for dvt px, h2ra for gi px   Nutritional Goals:    2100-2300 kCal, 100-110 grams of protein per day  Plan:  Will increase Clinimix E 5/15 to 74ml/h. Intralipid 10 ml/hr on MWF only due to Sport and exercise psychologist. Trace elements on MWF only due to Sport and exercise psychologist. Cont IVF at [facility]. Cont SSI at Q6.  TPN labs in am.  Talbert Cage, PharmD Pager [phone] 11/12/2011,7:48 AM

## 2011-11-12 NOTE — Progress Notes (Signed)
Nutrition Follow-up  Intervention:    TPN per pharmacy RD to follow for nutrition care plan  Patient is receiving TPN with Clinimix E 5/15 @ 90 ml/hr.  Lipids (20% IVFE @ 10 ml/hr), multivitamins, and trace elements are provided 3 times weekly (MWF) due to national backorder.  Provides 1739 kcal and 108 grams protein daily (based on weekly average).  Meets 83% minimum estimated kcal and 100% minimum estimated protein needs.  Diet Order:  NPO  Meds: Scheduled Meds:   . alteplase  2 mg Intracatheter Once  . antiseptic oral rinse  15 mL Mouth Rinse QID  . aspirin  300 mg Rectal Daily  . budesonide  0.25 mg Nebulization Q6H  . chlorhexidine  15 mL Mouth Rinse BID  . enoxaparin (LOVENOX) injection  30 mg Subcutaneous Q24H  . insulin aspart  0-9 Units Subcutaneous Q4H  . metoprolol  5 mg Intravenous Q6H  . potassium chloride  10 mEq Intravenous Q1 Hr x 2  . potassium chloride      . potassium chloride      . sodium chloride  10-40 mL Intracatheter Q12H  . sodium chloride  3 mL Intravenous Q12H  . DISCONTD: levalbuterol  0.63 mg Nebulization Q6H   Continuous Infusions:   . amiodarone (NEXTERONE PREMIX) 360 mg/200 mL dextrose 30 mg/hr (11/11/11 1538)  . TPN (CLINIMIX) +/- additives 40 mL/hr at 11/10/11 2000   And  . fat emulsion 100 mL (11/10/11 2000)  . TPN (CLINIMIX) +/- additives     And  . fat emulsion    . TPN (CLINIMIX) +/- additives 80 mL/hr at 11/11/11 1722   PRN Meds:.sodium chloride, labetalol, levalbuterol, morphine injection, ondansetron (ZOFRAN) IV, sodium chloride, sodium chloride  Labs:  CMP     Component Value Date/Time   NA 144 11/12/2011 0520   K 3.7 11/12/2011 0520   CL 106 11/12/2011 0520   CO2 29 11/12/2011 0520   GLUCOSE 114* 11/12/2011 0520   BUN 36* 11/12/2011 0520   CREATININE 1.65* 11/12/2011 0520   CREATININE 1.58* 09/18/2011 1224   CALCIUM 8.8 11/12/2011 0520   PROT 5.6* 11/11/2011 0400   ALBUMIN 2.5* 11/11/2011 0400   AST 45* 11/11/2011 0400   ALT 73* 11/11/2011  0400   ALKPHOS 95 11/11/2011 0400   BILITOT 1.2 11/11/2011 0400   GFRNONAA 40* 11/12/2011 0520   GFRAA 46* 11/12/2011 0520     Intake/Output Summary (Last 24 hours) at 11/12/11 1000 Last data filed at 11/11/11 1600  Gross per 24 hour  Intake  522.6 ml  Output    350 ml  Net  172.6 ml    CBG (last 3)   Basename 11/12/11 0804 11/12/11 0405 11/12/11 0003  GLUCAP 108* 138* 115*    Weight Status:  84.9 kg (7/2) -- trending down  Re-estimated needs:  2100-2300 kcals, 100-110 gm protein  Nutrition Dx:  Inadequate Oral Intake, ongoing  Goal:  TPN to meet >90% of estimated nutrition needs, maximize energy provision as able during national lipid backorder, met  Monitor:  TPN prescription, PO diet advancement, weight, labs, I/O's   Alger Memos, RD, LDN Pager #: [phone]

## 2011-11-12 NOTE — Clinical Documentation Improvement (Signed)
MALNUTRITION DOCUMENTATION CLARIFICATION  THIS DOCUMENT IS NOT A PERMANENT PART OF THE MEDICAL RECORD  TO RESPOND TO THE THIS QUERY, FOLLOW THE INSTRUCTIONS BELOW:  1. If needed, update documentation for the patient's encounter via the notes activity.  2. Access this query again and click edit on the In [employer].  3. After updating, or not, click F2 to complete all highlighted (required) fields concerning your review. Select "additional documentation in the medical record" OR "no additional documentation provided".  4. Click Sign note button.  5. The deficiency will fall out of your In Basket *Please let us know if you are not able to complete this workflow by phone or e-mail (listed below).  Please update your documentation within the medical record to reflect your response to this query.                                                                                        11/12/11   Dear Dr.Geovanie Winnett / Associates,  In a better effort to capture your patient's severity of illness, reflect appropriate length of stay and utilization of resources, a review of the patient medical record has revealed the following indicators.    Based on your clinical judgment, please clarify and document in a progress note and/or discharge summary the clinical condition associated with the following supporting information:  In responding to this query please exercise your independent judgment.  The fact that a query is asked, does not imply that any particular answer is desired or expected.  Possible Clinical Conditions?  _______Mild Malnutrition  _______Moderate Malnutrition _______Severe Malnutrition   _______Protein Calorie Malnutrition _______Severe Protein Calorie Malnutrition ____x___Other Condition _______Cannot clinically determine Does not have malnutrition now, but if not given tna will be    Supporting Information:  Per RD's evaluation: Inadequate oral intake related to inability  to eat, NPO. Tube feeding held 6/26 due to gastric residual of .  Failed swallow study on 6/28 related to moderate-severe dysphagia. Unsuccessful [name] placement on 11/10/11.  Signs & Symptoms: Ht: 6'0"     Wt: 97.2  BMI: 28.34  Treatment  Enteral Feeding  Medications: TPN to meet >90% of estimated nutrition needs. Intralipid 41ml/hr M-W-F.   Nutrition Consult: initial consult 11/03/11.   You may use possible, probable, or suspect with inpatient documentation. possible, probable, suspected diagnoses MUST be documented at the time of discharge  Reviewed:  no additional documentation provided  Thank You,  Marciano Sequin,  Clinical Documentation Specialist:  Pager: [phone]  Health Information Management [place]

## 2011-11-12 NOTE — Progress Notes (Signed)
Speech Language Pathology Dysphagia Treatment Patient Details Name: Nathan [name] MRN: [id number] DOB: 1940-08-03 Today's Date: 11/12/2011 Time: 1115-1140 SLP Time Calculation (min): 25 min  Assessment / Plan / Recommendation Clinical Impression  Session focused on eliciting pharyngeal swallow, teaching effortful swallow and masako manuever for pharyngeal and base-of-tongue strengthening.  Pt continues to demonstrate signs of right CN X deficit with significant assymetry of soft palate/faucial pillars.  Phonation often audibly wet due to decreased secretion management.   Rec repeat MBS this Friday, 7/5 to determine improvements and potential for resumption of PO diet.       Diet Recommendation  Continue with Current Diet: NPO    SLP Plan Continue with current plan of care;MBS   Pertinent Vitals/Pain No c/o pain   Swallowing Goals  SLP Swallowing Goals Goal #3: Pt. will perform laryngeal and pharyngeal strengthening exercises with min verbal and visual cues.  Swallow Study Goal #3 - Progress: Progressing toward goal  General Temperature Spikes Noted: No Respiratory Status: Room air Behavior/Cognition: Alert;Cooperative;Pleasant mood Oral Cavity - Dentition: Adequate natural dentition Patient Positioning: Upright in chair  Oral Cavity - Oral Hygiene Does patient have any of the following "at risk" factors?: Other - dysphagia Brush patient's teeth BID with toothbrush (using toothpaste with fluoride): Yes Patient is HIGH RISK - Oral Care Protocol followed (see row info): Yes   Dysphagia Treatment Treatment focused on: Facilitation of pharyngeal phase Treatment Methods/Modalities: Skilled observation;Effortful swallow;Masako Maneuver Patient observed directly with PO's: Yes Type of PO's observed: Ice chips Feeding: Able to feed self Pharyngeal Phase Signs & Symptoms: Multiple swallows;Wet vocal quality Type of cueing: Verbal Amount of cueing: Minimal  Nathan [name] L. Nathan [name], [place]  CCC/SLP Pager [phone]      Nathan [name] Nathan [name] 11/12/2011, 12:56 PM

## 2011-11-12 NOTE — Progress Notes (Signed)
CARDIAC REHAB PHASE I   PRE:  Rate/Rhythm: 66SR  BP:  Supine:   Sitting: 164/69  Standing:    SaO2: 98%RA  MODE:  Ambulation: 350 ft   POST:  Rate/Rhythem: 78SR  BP:  Supine:   Sitting: 150/63  Standing:    SaO2: 98%RA 1230-1302 Pt walked 350FT on RA with rolling walker and asst x 1. Tolerated well. Gait steady. To recliner after walk.  Duanne Limerick

## 2011-11-12 NOTE — Progress Notes (Signed)
Patient Name: Nathan [name] Date of Encounter: 11/12/2011    SUBJECTIVE: He is awake, alert, and oriented. He had a good nights sleep. He is feeling stronger and stronger. He is hungry and wants to start eating. A swallow study is scheduled for Friday.  TELEMETRY:  Normal sinus rhythm.: Filed Vitals:   11/12/11 1156 11/12/11 1317 11/12/11 1450 11/12/11 1800  BP: 164/69 164/78  148/57  Pulse: 70 61  68  Temp:  97.4 F (36.3 C)    TempSrc:  Oral    Resp:  18    Height:      Weight:      SpO2:  96% 98%     Intake/Output Summary (Last 24 hours) at 11/12/11 1850 Last data filed at 11/12/11 1230  Gross per 24 hour  Intake      0 ml  Output    300 ml  Net   -300 ml    LABS: Basic Metabolic Panel:  Basename 11/12/11 0520 11/11/11 0400 11/10/11 0430  NA 144 144 --  K 3.7 3.5 --  CL 106 105 --  CO2 29 29 --  GLUCOSE 114* 111* --  BUN 36* 39* --  CREATININE 1.65* 1.52* --  CALCIUM 8.8 8.9 --  MG -- 2.3 2.4  PHOS -- 3.7 5.1*   CBC:  Basename 11/12/11 0520 11/11/11 0400  WBC 13.3* 14.2*  NEUTROABS -- 10.3*  HGB 9.1* 9.1*  HCT 28.9* 28.5*  MCV 92.3 91.1  PLT 361 355   Fasting Lipid Panel:  Basename 11/11/11 0400  CHOL 104  HDL --  LDLCALC --  TRIG 159*  CHOLHDL --  LDLDIRECT --    Radiology/Studies:  No new  Physical Exam: Blood pressure 148/57, pulse 68, temperature 97.4 F (36.3 C), temperature source Oral, resp. rate 18, height 6' 0.84" (1.85 m), weight 84.9 kg (187 lb 2.7 oz), SpO2 98.00%. Weight change:    Faint rales in the bases. No rhonchi. Cardiac exam reveals no rub. No murmur. There is no edema.  ASSESSMENT:  1. Status post coronary bypass surgery, stable without recurrent angina.  2. Paroxysmal atrial fibrillation, stable in normal sinus rhythm on IV amiodarone.  3. Hypertension   Plan:  1. Switch to oral amiodarone once he passes a swallow evaluation.   2. Amlodipine should be started 5 or 10 mg per day for blood pressure control  once he is able to take by mouth.  3. Metoprolol 25 or 50 mg by mouth twice a day once taking by mouth.  4. I would not resume ACE inhibitor therapy until the patient's renal function is stable  Signed, Lesleigh Noe 11/12/2011, 6:50 PM

## 2011-11-12 NOTE — Progress Notes (Signed)
Pt ambulated 350 feet in hallway with RN and rolling walker; no difficulty noted; pt stopped twice for rest break; pt assisted back to room to chair; wife in room; will cont. To monitor.

## 2011-11-12 NOTE — Progress Notes (Addendum)
[name]            [place] [address]          [phone]     12 Days Post-Op  Procedure(s) (LRB): CORONARY ARTERY BYPASS GRAFTING (CABG) (N/A) ENDARTERECTOMY CAROTID (Right) Subjective: Feeling better each day  Objective  Telemetry sinus rhythm, 3 bt run vtach   Temp:  [97.4 F (36.3 C)-98 F (36.7 C)] 97.9 F (36.6 C) (07/03 0417) Pulse Rate:  [61-70] 68  (07/03 0417) Resp:  [11-27] 19  (07/03 0417) BP: (131-178)/(63-99) 160/70 mmHg (07/03 0547) SpO2:  [92 %-98 %] 97 % (07/03 0417)   Intake/Output Summary (Last 24 hours) at 11/12/11 0842 Last data filed at 11/11/11 1600  Gross per 24 hour  Intake  716.8 ml  Output    350 ml  Net  366.8 ml       General appearance: alert, cooperative and no distress Heart: regular rate and rhythm and S1, S2 normal Lungs: coarse throughout Abdomen: soft, nontender Extremities: no edema Wound: incisions healing well  Lab Results:  Basename 11/12/11 0520 11/11/11 0400 11/10/11 0430  NA 144 144 --  K 3.7 3.5 --  CL 106 105 --  CO2 29 29 --  GLUCOSE 114* 111* --  BUN 36* 39* --  CREATININE 1.65* 1.52* --  CALCIUM 8.8 8.9 --  MG -- 2.3 2.4  PHOS -- 3.7 5.1*    Basename 11/11/11 0400 11/10/11 0430  AST 45* 64*  ALT 73* 66*  ALKPHOS 95 106  BILITOT 1.2 1.3*  PROT 5.6* 5.9*  ALBUMIN 2.5* 2.6*   No results found for this basename: LIPASE:2,AMYLASE:2 in the last 72 hours  Basename 11/12/11 0520 11/11/11 0400  WBC 13.3* 14.2*  NEUTROABS -- 10.3*  HGB 9.1* 9.1*  HCT 28.9* 28.5*  MCV 92.3 91.1  PLT 361 355   No results found for this basename: CKTOTAL:4,CKMB:4,TROPONINI:4 in the last 72 hours No components found with this basename: POCBNP:3 No results found for this basename: DDIMER in the last 72 hours No results found for this basename: HGBA1C in the last 72 hours  Basename 11/11/11 0400  CHOL 104  HDL --  LDLCALC --  TRIG 159*  CHOLHDL --   No results found for  this basename: TSH,T4TOTAL,FREET3,T3FREE,THYROIDAB in the last 72 hours No results found for this basename: VITAMINB12,FOLATE,FERRITIN,TIBC,IRON,RETICCTPCT in the last 72 hours  Medications: Scheduled    . alteplase  2 mg Intracatheter Once  . antiseptic oral rinse  15 mL Mouth Rinse QID  . aspirin  300 mg Rectal Daily  . budesonide  0.25 mg Nebulization Q6H  . chlorhexidine  15 mL Mouth Rinse BID  . enoxaparin (LOVENOX) injection  30 mg Subcutaneous Q24H  . insulin aspart  0-9 Units Subcutaneous Q4H  . metoprolol  5 mg Intravenous Q6H  . potassium chloride  10 mEq Intravenous Q1 Hr x 2  . potassium chloride      . potassium chloride      . sodium chloride  10-40 mL Intracatheter Q12H  . sodium chloride  3 mL Intravenous Q12H  . DISCONTD: levalbuterol  0.63 mg Nebulization Q6H     Radiology/Studies:  No results found.  INR: Will add last result for INR, ABG once components are confirmed Will add last 4 CBG results once components are confirmed  Assessment/Plan: S/P Procedure(s) (LRB): CORONARY ARTERY BYPASS GRAFTING (CABG) (  N/A) ENDARTERECTOMY CAROTID (Right)  1. Making progress- cont to push rehab 2 creat relatively stable 3 H/H stable 4 swallowing being adressed 5 pulm toilet- push as able  LOS: 12 days    Nathan [name],Nathan [name] 7/3/20138:42 AM   follow up  I have seen and examined Nathan [name] and agree with the above assessment  and plan.  Nathan Ovens MD Beeper [phone] Office [phone] 11/12/2011 1:49 PM

## 2011-11-13 LAB — COMPREHENSIVE METABOLIC PANEL
ALT: 80 U/L — ABNORMAL HIGH (ref 0–53)
AST: 47 U/L — ABNORMAL HIGH (ref 0–37)
Albumin: 2.5 g/dL — ABNORMAL LOW (ref 3.5–5.2)
Alkaline Phosphatase: 92 U/L (ref 39–117)
BUN: 36 mg/dL — ABNORMAL HIGH (ref 6–23)
CO2: 26 mEq/L (ref 19–32)
Calcium: 8.6 mg/dL (ref 8.4–10.5)
Chloride: 105 mEq/L (ref 96–112)
Creatinine, Ser: 1.45 mg/dL — ABNORMAL HIGH (ref 0.50–1.35)
GFR calc Af Amer: 54 mL/min — ABNORMAL LOW (ref >90)
GFR calc non Af Amer: 47 mL/min — ABNORMAL LOW (ref >90)
Glucose, Bld: 127 mg/dL — ABNORMAL HIGH (ref 70–99)
Potassium: 3.9 mEq/L (ref 3.5–5.1)
Sodium: 141 mEq/L (ref 135–145)
Total Bilirubin: 1 mg/dL (ref 0.3–1.2)
Total Protein: 5.4 g/dL — ABNORMAL LOW (ref 6.0–8.3)

## 2011-11-13 LAB — GLUCOSE, CAPILLARY
Glucose-Capillary: 104 mg/dL — ABNORMAL HIGH (ref 70–99)
Glucose-Capillary: 107 mg/dL — ABNORMAL HIGH (ref 70–99)
Glucose-Capillary: 122 mg/dL — ABNORMAL HIGH (ref 70–99)
Glucose-Capillary: 170 mg/dL — ABNORMAL HIGH (ref 70–99)
Glucose-Capillary: 95 mg/dL (ref 70–99)

## 2011-11-13 LAB — PHOSPHORUS: Phosphorus: 3.9 mg/dL (ref 2.3–4.6)

## 2011-11-13 LAB — MAGNESIUM: Magnesium: 2.1 mg/dL (ref 1.5–2.5)

## 2011-11-13 MED ORDER — FAMOTIDINE 10 MG/ML IV SOLN
INTRAVENOUS | Status: AC
  Administered 2011-11-13: 18:00:00 via INTRAVENOUS
  Filled 2011-11-13: qty 2000

## 2011-11-13 NOTE — Progress Notes (Signed)
Pt ambulated 550 feet in hallway with RN and rolling walker; pt tolerated ambulation well; pt assisted to chair; call bell w/i reach; will cont. To monitor.

## 2011-11-13 NOTE — Progress Notes (Signed)
Pt ambulated 350 feet with RN and rolling walker; pt assisted back to room to sit in chair; will cont. To monitor.

## 2011-11-13 NOTE — Clinical Social Work Psychosocial (Signed)
Clinical Social Work Department BRIEF PSYCHOSOCIAL ASSESSMENT 11/13/2011  Patient:  Nathan [name], Nathan [name]     Account Number:  [id number]     Admit date:  10/31/2011  Clinical Social Worker:  Andres Shad  Date/Time:  11/13/2011 09:27 AM  Referred by:  Care Management  Date Referred:  11/13/2011 Referred for  SNF Placement   Other Referral:   DC planning   Interview type:  Patient Other interview type:   chart review/PT notes    PSYCHOSOCIAL DATA Living Status:  WIFE Admitted from facility:   Level of care:   Primary support name:  Damaso Laday Primary support relationship to patient:  SPOUSE Degree of support available:   Supportive.  She does work, however she can check in on him and provide for needs per his report.    CURRENT CONCERNS Current Concerns  Post-Acute Placement   Other Concerns:   none    SOCIAL WORK ASSESSMENT / PLAN Met with patient at the bedside who was very pleasant and cooperative. Explained role and reason for consult in which patient was aware and receptive.    Patient reports fears of going home and what he will need and what that will look like after this hospital stay. Patient reports he feels he will need SNF, but at this time it is not recommended by PT.    Patient also educated about his type of insurance [facility]) in which they will cover patient SNF admission due to not meeting SNF requirements per PT.    Patient has been progressing very well with PT and is open to talking with wife about going home and also receiving home health.    Would like CSW to return after swallow Eval on 7/5 with final plans and arranging what is needed.    At this time patient reports he cannot pay privately for SNF, thus [facility] would be more appropriate.   Assessment/plan status:  Psychosocial Support/Ongoing Assessment of Needs Other assessment/ plan:   SNF vs Home health   Information/referral to community resources:   [facility] and SNF list of information    Patient denies all other resources    PATIENT'S/FAMILY'S RESPONSE TO PLAN OF CARE: Patient very receptive to SNF and HH review and information.  Reports he feels he can go home with [facility] and wife supervising, but wants to talk about wife.  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  However she is also able to check in on patient and provide for needs.  Will follow up with final disposition.    Ashley Jacobs, MSW LCSW [phone] Coverage

## 2011-11-13 NOTE — Progress Notes (Signed)
Physical Therapy Treatment Patient Details Name: Nathan [name] MRN: [id number] DOB: 05/22/1940 Today's Date: 11/13/2011 Time: 4098-1191 PT Time Calculation (min): 17 min  PT Assessment / Plan / Recommendation Comments on Treatment Session  Pt s/p CABG and rt CEA.  Pt making progress.  Feel he will need HHPT and rolling walker.    Follow Up Recommendations  Home health PT    Barriers to Discharge        Equipment Recommendations  Rolling walker with 5" wheels    Recommendations for Other Services    Frequency Min 3X/week   Plan Discharge plan needs to be updated;Equipment recommendations need to be updated;Frequency remains appropriate    Precautions / Restrictions Precautions Precautions: Sternal;Fall   Pertinent Vitals/Pain N/A    Mobility  Transfers Sit to Stand: 4: Min guard;From chair/3-in-1;Without upper extremity assist (hands on knees) Stand to Sit: 5: Supervision;Without upper extremity assist;To chair/3-in-1 (hands on knees) Ambulation/Gait Ambulation/Gait Assistance: 4: Min guard Ambulation Distance (Feet): 350 Feet Assistive device: None Ambulation/Gait Assistance Details: Pt slightly unsteady without walker Gait Pattern: Decreased stride length    Exercises     PT Diagnosis:    PT Problem List:   PT Treatment Interventions:     PT Goals Acute Rehab PT Goals PT Goal: Sit to Stand - Progress: Progressing toward goal PT Goal: Stand to Sit - Progress: Progressing toward goal PT Goal: Ambulate - Progress: Progressing toward goal Additional Goals PT Goal: Additional Goal #1 - Progress: Progressing toward goal  Visit Information  Last PT Received On: 11/13/11 Assistance Needed: +1    Subjective Data  Subjective: Pt without c/o's   Cognition  Overall Cognitive Status: Appears within functional limits for tasks assessed/performed Arousal/Alertness: Awake/alert Orientation Level: Appears intact for tasks assessed Behavior During Session: [facility] for  tasks performed    Balance  Static Standing Balance Static Standing - Balance Support: No upper extremity supported Static Standing - Level of Assistance: 5: Stand by assistance  End of Session PT - End of Session Equipment Utilized During Treatment: Gait belt Activity Tolerance: Patient tolerated treatment well Patient left: in chair;with call bell/phone within reach Nurse Communication: Mobility status   GP     Nathan [name] 11/13/2011, 1:49 PM  [employer] PT [phone]

## 2011-11-13 NOTE — Progress Notes (Addendum)
13 Days Post-Op Procedure(s) (LRB): CORONARY ARTERY BYPASS GRAFTING (CABG) (N/A) ENDARTERECTOMY CAROTID (Right)  Subjective: Mr Krise is without complaints this morning.  He states he is feeling pretty good. Patient remains NPO  Objective: Vital signs in last 24 hours: Temp:  [97.4 F (36.3 C)-98.7 F (37.1 C)] 98.7 F (37.1 C) (07/04 0414) Pulse Rate:  [61-96] 96  (07/04 0414) Cardiac Rhythm:  [-] Normal sinus rhythm (07/04 0741) Resp:  [18-19] 18  (07/04 0414) BP: (148-165)/(57-78) 154/65 mmHg (07/04 0531) SpO2:  [95 %-100 %] 97 % (07/04 0414) Weight:  [190 lb 7.6 oz (86.4 kg)] 190 lb 7.6 oz (86.4 kg) (07/04 0624)  Intake/Output from previous day: 07/03 0701 - 07/04 0700 In: 0  Out: 625 [Urine:625]  General appearance: alert, cooperative and no distress Heart: regular rate and rhythm Lungs: clear to auscultation bilaterally Abdomen: soft, non-tender; bowel sounds normal; no masses,  no organomegaly Extremities: edema 1+ Wound: clean and dry  Lab Results:  Basename 11/12/11 0520 11/11/11 0400  WBC 13.3* 14.2*  HGB 9.1* 9.1*  HCT 28.9* 28.5*  PLT 361 355   BMET:  Basename 11/13/11 0500 11/12/11 0520  NA 141 144  K 3.9 3.7  CL 105 106  CO2 26 29  GLUCOSE 127* 114*  BUN 36* 36*  CREATININE 1.45* 1.65*  CALCIUM 8.6 8.8    PT/INR: No results found for this basename: LABPROT,INR in the last 72 hours ABG    Component Value Date/Time   PHART 7.467* 11/06/2011 0434   HCO3 30.5* 11/06/2011 0434   TCO2 31.8 11/06/2011 0434   ACIDBASEDEF 1.0 11/02/2011 0423   O2SAT 99.4 11/06/2011 0434   CBG (last 3)   Basename 11/13/11 0415 11/13/11 0010 11/12/11 2035  GLUCAP 107* 104* 112*    Assessment/Plan: S/P Procedure(s) (LRB): CORONARY ARTERY BYPASS GRAFTING (CABG) (N/A) ENDARTERECTOMY CAROTID (Right)  1. CV- NSR on IV Labatelol and Amiodarone 2. Dysphagia- being followed by speech and swallow, evaluated yesterday and still concerned for some paralysis with Cranial  nerve X, will get repeat MBS for Friday to assess possibility of starting PO intake 3. Renal- creatinine remains somewhat elevated, but is stable 4. Pulmonary- continue IS 5. Ambulate   LOS: 13 days    BARRETT, ERIN 11/13/2011  For repeat MBS tomorrow I have seen and examined [name] and agree with the above assessment  and plan.  Delight Ovens MD Beeper [phone] Office [phone] 11/13/2011 11:02 AM

## 2011-11-13 NOTE — Progress Notes (Signed)
PARENTERAL NUTRITION CONSULT NOTE - FOLLOW UP  Pharmacy Consult for TPN Indication: intolerance to enteral feed/unsuccessful panda placement  No Known Allergies  Patient Measurements: Height: 6' 0.83" (185 cm) Weight: 190 lb 7.6 oz (86.4 kg) IBW/kg (Calculated) : 79.52   Vital Signs: Temp: 98.7 F (37.1 C) (07/04 0414) Temp src: Oral (07/04 0414) BP: 154/65 mmHg (07/04 0531) Pulse Rate: 96  (07/04 0414) Intake/Output from previous day: 07/03 0701 - 07/04 0700 In: 0  Out: 625 [Urine:625] Intake/Output from this shift:    Labs:  Basename 11/12/11 0520 11/11/11 0400  WBC 13.3* 14.2*  HGB 9.1* 9.1*  HCT 28.9* 28.5*  PLT 361 355  APTT -- --  INR -- --     Basename 11/13/11 0500 11/12/11 0520 11/11/11 0400  NA 141 144 144  K 3.9 3.7 3.5  CL 105 106 105  CO2 26 29 29   GLUCOSE 127* 114* 111*  BUN 36* 36* 39*  CREATININE 1.45* 1.65* 1.52*  LABCREA -- -- --  CREAT24HRUR -- -- --  CALCIUM 8.6 8.8 8.9  MG 2.1 -- 2.3  PHOS 3.9 -- 3.7  PROT 5.4* -- 5.6*  ALBUMIN 2.5* -- 2.5*  AST 47* -- 45*  ALT 80* -- 73*  ALKPHOS 92 -- 95  BILITOT 1.0 -- 1.2  BILIDIR -- -- --  IBILI -- -- --  PREALBUMIN -- -- 17.2*  TRIG -- -- 159*  CHOLHDL -- -- --  CHOL -- -- 104   Estimated Creatinine Clearance: 51.8 ml/min (by C-G formula based on Cr of 1.45).    Basename 11/13/11 0415 11/13/11 0010 11/12/11 2035  GLUCAP 107* 104* 112*    Medical History: Past Medical History  Diagnosis Date  . Aorto-iliac disease     bilat with stent 2007  . CAD (coronary artery disease)     with loop to the circumflex 2000, inferolateral infarction  . Hypertension   . Paroxysmal atrial flutter     ablation  . ED (erectile dysfunction)   . COPD (chronic obstructive pulmonary disease)   . Hyperlipidemia   . Stroke   . Shortness of breath   . GERD (gastroesophageal reflux disease)   . Gout     Medications:  Scheduled:     . antiseptic oral rinse  15 mL Mouth Rinse QID  . aspirin   300 mg Rectal Daily  . budesonide  0.25 mg Nebulization Q6H  . chlorhexidine  15 mL Mouth Rinse BID  . enoxaparin (LOVENOX) injection  30 mg Subcutaneous Q24H  . insulin aspart  0-9 Units Subcutaneous Q4H  . metoprolol  5 mg Intravenous Q6H  . sodium chloride  10-40 mL Intracatheter Q12H  . sodium chloride  3 mL Intravenous Q12H    Insulin Requirements in the past 24 hours:  0 units Novolog  Current Nutrition:  npo  Assessment: 71 y/o male patient s/p CABG, POD#13 with dysphagia, unsuccessful panda placement. Patient had PICC line placed and started tpn 7/1.   Clinimix at 90/ml/hr which patient tolerated and is at goal rate. CBG <150.  Goal rate 90 ml/hr will provide 108 gm protein/day.  TPN and lipids at 10 ml/hr MWF will provide an average 1569 Kcal/day.  Plan repeat swallow study tomorrow.   Anticoagulation: lovenox 30mg  daily, lower dose d/t bleeding risk, could potentially increase to 40mg  daily when stable Infectious Disease: afebrile; wbc 13.3 Cardiovascular: h/o CAD/PVD?HTN; s/p CABG, bp 154/65, nsr, on amio at 30mg /hr and lopressor 5mg  q6h Endocrinology: cbg <150 , no  h/o DM, will dc ssi Gastrointestinal / Nutrition: h/o GERD; will add pepcid as patient takes prilosec at home, d/c protonix; post op dysphagia, unsuccessful panda placement.  Noted elevated lft, dec today,  will watch Neurology: no pain reported Nephrology: K 3.9, SrCr 1.45 Pulmonary: COPD; 97% on ra; on pulmicort and xopenex Hematology / Oncology: h/h low, post op abla PTA Medication Issues: home meds not resumed d/t po status Best Practices: lovenox for dvt px, h2ra for gi px   Nutritional Goals:  2100-2300 kCal, 100-110 grams of protein per day  Plan:  Continue Clinimix E 5/15 at 37ml/h. Intralipid 10 ml/hr on MWF only due to Sport and exercise psychologist. Trace elements on MWF only due to Sport and exercise psychologist. Cont IVF at [facility] D/C SSI at Q6.   Talbert Cage, PharmD Pager [phone] 11/13/2011,7:40 AM

## 2011-11-14 ENCOUNTER — Inpatient Hospital Stay (HOSPITAL_COMMUNITY): Payer: Medicare PPO

## 2011-11-14 MED ORDER — METOPROLOL TARTRATE 25 MG PO TABS
25.0000 mg | ORAL_TABLET | Freq: Two times a day (BID) | ORAL | Status: DC
  Administered 2011-11-14 – 2011-11-16 (×4): 25 mg via ORAL
  Filled 2011-11-14 (×6): qty 1

## 2011-11-14 MED ORDER — ZINC TRACE METAL 1 MG/ML IV SOLN
INTRAVENOUS | Status: DC
  Filled 2011-11-14: qty 2000

## 2011-11-14 MED ORDER — FAT EMULSION 20 % IV EMUL
250.0000 mL | INTRAVENOUS | Status: DC
  Filled 2011-11-14: qty 250

## 2011-11-14 MED ORDER — RESOURCE THICKENUP CLEAR PO POWD
ORAL | Status: DC | PRN
  Filled 2011-11-14: qty 125

## 2011-11-14 MED ORDER — AMIODARONE HCL 200 MG PO TABS
400.0000 mg | ORAL_TABLET | Freq: Two times a day (BID) | ORAL | Status: DC
  Administered 2011-11-14 – 2011-11-16 (×5): 400 mg via ORAL
  Filled 2011-11-14 (×6): qty 2

## 2011-11-14 MED ORDER — AMLODIPINE BESYLATE 5 MG PO TABS
5.0000 mg | ORAL_TABLET | Freq: Every day | ORAL | Status: DC
  Administered 2011-11-14 – 2011-11-15 (×2): 5 mg via ORAL
  Filled 2011-11-14 (×3): qty 1

## 2011-11-14 MED ORDER — ASPIRIN EC 325 MG PO TBEC
325.0000 mg | DELAYED_RELEASE_TABLET | Freq: Every day | ORAL | Status: DC
  Administered 2011-11-14 – 2011-11-16 (×3): 325 mg via ORAL
  Filled 2011-11-14 (×3): qty 1

## 2011-11-14 NOTE — Progress Notes (Signed)
CARDIAC REHAB PHASE I   PRE:  Rate/Rhythm: 64 SR  BP:  Supine:   Sitting: 139/53  Standing:    SaO2: 99 RA  MODE:  Ambulation: 550 ft   POST:  Rate/Rhythem: 81 SR  BP:  Supine:   Sitting: 152/59  Standing:    SaO2: 99 RA 1320-1450 Assisted X 1 and used walker to ambulate. Gait steady with walker. VS stable. Pt to bed after walk with call light in reach.  Beatrix Fetters

## 2011-11-14 NOTE — Procedures (Signed)
Objective Swallowing Evaluation: Modified Barium Swallowing Study  Patient Details  Name: Nathan [name] MRN: [id number] Date of Birth: 02/11/1941  Today's Date: 11/14/2011 Time: 0950-1015 SLP Time Calculation (min): 25 min  Past Medical History:  Past Medical History  Diagnosis Date  . Aorto-iliac disease     bilat with stent 2007  . CAD (coronary artery disease)     with loop to the circumflex 2000, inferolateral infarction  . Hypertension   . Paroxysmal atrial flutter     ablation  . ED (erectile dysfunction)   . COPD (chronic obstructive pulmonary disease)   . Hyperlipidemia   . Stroke   . Shortness of breath   . GERD (gastroesophageal reflux disease)   . Gout    Past Surgical History:  Past Surgical History  Procedure Date  . Spinal fusion   . Hand surgery   . Neck fusion 1975  . Eye surgery   . Coronary artery bypass graft 10/31/2011    Procedure: CORONARY ARTERY BYPASS GRAFTING (CABG);  Surgeon: Delight Ovens, MD;  Location: [facility] OR;  Service: Open Heart Surgery;  Laterality: N/A;  times three using  Left Internal Mammary Artery and Right Greater Saphenous Vein Graft harvested endoscopically; TEE  . Endarterectomy 10/31/2011    Procedure: ENDARTERECTOMY CAROTID;  Surgeon: Nada Libman, MD;  Location: [facility] OR;  Service: Vascular;  Laterality: Right;  with patch angioplasty    HPI:  71 yr old who underwent right carotid endarterectomy and CBAG 6/21.  Extubated after surgery experienced respiratory distress was re-intubated and extubated 6/27.  CXR 6/27 with pulmonary venous congestion and increased plural effusion.  Esophagram 12/08/00 revealed large amount of GERD and small Zenker's diverticulum.  Current MBS ordered to determine potential to advance diet.      Assessment / Plan / Recommendation Clinical Impression  Dysphagia Diagnosis: Mild pharyngeal phase dysphagia Clinical impression: Patient presents with great improvement in overall swallowing function from  previous exam. Patient now presents with a mild sensory-motor based pharyngeal phase dysphagia characterized by delayed swallow initiation, decreased anterior hyoid movement, and decreased laryngeal closure resulting in silent aspiration of thin liquids (not prevented with use of chin tuck or smaller bolus size) and flash penetration of nectar thick liquids. Patient able to fully protect the airway with solids. Additionally, combination of laryngeal weakness and tight UES (h/o GERD, Zenkers), results in trace pyriform sinus residue post swallow which clears spontaneously but does mildly increase aspiration risk. Patient judged safe to initiate a dysphagia 3 diet, nectar thick liquids with strict use of safe swallowing precautions. SLP will f/u. If patient to d/c, recommend OP SLP f/u and repeat MBS in 2 weeks to assess potential to advance diet.     Treatment Recommendation  Therapy as outlined in treatment plan below    Diet Recommendation Dysphagia 3 (Mechanical Soft);Nectar-thick liquid   Liquid Administration via: Cup;No straw Medication Administration: Whole meds with puree Supervision: Patient able to self feed;Full supervision/cueing for compensatory strategies Compensations: Slow rate;Small sips/bites Postural Changes and/or Swallow Maneuvers: Seated upright 90 degrees    Other  Recommendations Recommended Consults: MBS (repeat MBS in 2 weeks if discharges home) Oral Care Recommendations: Oral care BID Other Recommendations: Order thickener from pharmacy;Prohibited food (jello, ice cream, thin soups);Remove water pitcher   Follow Up Recommendations  Outpatient SLP    Frequency and Duration min 2x/week  2 weeks   Pertinent Vitals/Pain n/a    SLP Swallow Goals Patient will consume recommended diet without observed clinical signs  of aspiration with: Supervision/safety Swallow Study Goal #1 - Progress: Not Met Patient will utilize recommended strategies during swallow to increase  swallowing safety with: Supervision/safety Swallow Study Goal #2 - Progress: Not met   General HPI: 71 yr old who underwent right carotid endarterectomy and CBAG 6/21.  Extubated after surgery experienced respiratory distress was re-intubated and extubated 6/27.  CXR 6/27 with pulmonary venous congestion and increased plural effusion.  Esophagram 12/08/00 revealed large amount of GERD and small Zenker's diverticulum.  Current MBS ordered to determine potential to advance diet.  Type of Study: Modified Barium Swallowing Study Reason for Referral: Objectively evaluate swallowing function Previous Swallow Assessment: 11/07/11-NPO Diet Prior to this Study: NPO Temperature Spikes Noted: No Respiratory Status: Room air History of Recent Intubation: Yes (see clinical impression statement) Date extubated: 11/06/11 Behavior/Cognition: Alert;Cooperative;Pleasant mood Oral Cavity - Dentition: Adequate natural dentition Oral Motor / Sensory Function: Impaired - see Bedside swallow eval (velar elevation symmetric, tongue symmetric) Self-Feeding Abilities: Able to feed self Patient Positioning: Upright in chair Baseline Vocal Quality: Clear Volitional Cough: Strong Volitional Swallow: Able to elicit Anatomy: Other (Comment) (noted cervical hardware from previous surgery and zenkers) Pharyngeal Secretions: Not observed secondary MBS    Reason for Referral Objectively evaluate swallowing function   Ferdinand Lango MA, CCC-SLP [phone]           Nathan [name] 11/14/2011, 10:34 AM

## 2011-11-14 NOTE — Progress Notes (Signed)
I have just spoken with [facility] who is doubtful based on patient's progress/ambulation that they would approve SNF. I advised patient and wife that it is unlikely [employer] will approve SNF stay- wife says she is working to get someone (maybe her sister coming from out of town) to aide in providing 24 hour care at home. RNCM also updated to above- Reece Levy, MSW, [place] [phone]

## 2011-11-14 NOTE — Progress Notes (Addendum)
[address]            [place] [address]          [phone]     14 Days Post-Op  Procedure(s) (LRB): CORONARY ARTERY BYPASS GRAFTING (CABG) (N/A) ENDARTERECTOMY CAROTID (Right) Subjective: Feels well   Objective  Telemetry sinus thythm  Temp:  [97.5 F (36.4 C)-97.9 F (36.6 C)] 97.5 F (36.4 C) (07/05 0507) Pulse Rate:  [58-103] 64  (07/05 0632) Resp:  [18-19] 19  (07/05 0507) BP: (145-164)/(53-71) 155/65 mmHg (07/05 0632) SpO2:  [95 %-99 %] 99 % (07/05 0507) Weight:  [194 lb 3.6 oz (88.1 kg)] 194 lb 3.6 oz (88.1 kg) (07/05 0718)   Intake/Output Summary (Last 24 hours) at 11/14/11 0813 Last data filed at 11/14/11 0718  Gross per 24 hour  Intake 2363.19 ml  Output    900 ml  Net 1463.19 ml       General appearance: alert, cooperative and no distress Heart: regular rate and rhythm and S1, S2 normal Lungs: coarse, improves with cough Abdomen: soft, nontender Extremities: R>L LE edema Wound: incisions healing well  Lab Results:  Basename 11/13/11 0500 11/12/11 0520  NA 141 144  K 3.9 3.7  CL 105 106  CO2 26 29  GLUCOSE 127* 114*  BUN 36* 36*  CREATININE 1.45* 1.65*  CALCIUM 8.6 8.8  MG 2.1 --  PHOS 3.9 --    Basename 11/13/11 0500  AST 47*  ALT 80*  ALKPHOS 92  BILITOT 1.0  PROT 5.4*  ALBUMIN 2.5*   No results found for this basename: LIPASE:2,AMYLASE:2 in the last 72 hours  Basename 11/12/11 0520  WBC 13.3*  NEUTROABS --  HGB 9.1*  HCT 28.9*  MCV 92.3  PLT 361   No results found for this basename: CKTOTAL:4,CKMB:4,TROPONINI:4 in the last 72 hours No components found with this basename: POCBNP:3 No results found for this basename: DDIMER in the last 72 hours No results found for this basename: HGBA1C in the last 72 hours No results found for this basename: CHOL,HDL,LDLCALC,TRIG,CHOLHDL in the last 72 hours No results found for this basename: TSH,T4TOTAL,FREET3,T3FREE,THYROIDAB in the last 72  hours No results found for this basename: VITAMINB12,FOLATE,FERRITIN,TIBC,IRON,RETICCTPCT in the last 72 hours  Medications: Scheduled    . antiseptic oral rinse  15 mL Mouth Rinse QID  . aspirin  300 mg Rectal Daily  . budesonide  0.25 mg Nebulization Q6H  . chlorhexidine  15 mL Mouth Rinse BID  . enoxaparin (LOVENOX) injection  30 mg Subcutaneous Q24H  . metoprolol  5 mg Intravenous Q6H  . sodium chloride  10-40 mL Intracatheter Q12H  . sodium chloride  3 mL Intravenous Q12H  . DISCONTD: insulin aspart  0-9 Units Subcutaneous Q4H     Radiology/Studies:  No results found.  INR: Will add last result for INR, ABG once components are confirmed Will add last 4 CBG results once components are confirmed  Assessment/Plan: S/P Procedure(s) (LRB): CORONARY ARTERY BYPASS GRAFTING (CABG) (N/A) ENDARTERECTOMY CAROTID (Right)  1. Swallow study this am 2 may need medication for hypertension when no longer NPO can determine agent 3 poss d/c soon   LOS: 14 days    [name],Nathan E 7/5/20138:13 AM    Waiting for MBS I have seen and examined Nathan [name] and agree with the above assessment  and plan.  Delight Ovens MD Beeper [phone] Office  [phone] 11/14/2011 8:57 AM

## 2011-11-14 NOTE — Progress Notes (Signed)
Pt to have MBS done at 0930 this morning; will cont. To monitor.

## 2011-11-14 NOTE — Progress Notes (Signed)
Physical Therapy Treatment Patient Details Name: Nathan [name] MRN: [id number] DOB: 01-12-1941 Today's Date: 11/14/2011 Time: 4098-1191 PT Time Calculation (min): 12 min  PT Assessment / Plan / Recommendation Comments on Treatment Session  PT s/p CABG and rt CEA.  Pt steadier with walker and he and wife agreeable to rolling walker and HHPT.    Follow Up Recommendations  Home health PT    Barriers to Discharge        Equipment Recommendations  Rolling walker with 5" wheels    Recommendations for Other Services    Frequency Min 3X/week   Plan Discharge plan remains appropriate;Frequency remains appropriate    Precautions / Restrictions Precautions Precautions: Sternal;Fall Restrictions Weight Bearing Restrictions: No   Pertinent Vitals/Pain N/A    Mobility  Transfers Sit to Stand: Without upper extremity assist;From bed;6: Modified independent (Device/Increase time) Stand to Sit: 6: Modified independent (Device/Increase time);Without upper extremity assist;To bed Details for Transfer Assistance: Repeated 4 times.  Required 2 attempts initially to stand but did so without assist. Ambulation/Gait Ambulation/Gait Assistance: 5: Supervision Ambulation Distance (Feet): 300 Feet Assistive device: Rolling walker Ambulation/Gait Assistance Details: Pt steadier with walker. Gait Pattern: Within Functional Limits    Exercises     PT Diagnosis:    PT Problem List:   PT Treatment Interventions:     PT Goals Acute Rehab PT Goals PT Goal: Sit to Stand - Progress: Met PT Goal: Stand to Sit - Progress: Met PT Goal: Ambulate - Progress: Progressing toward goal  Visit Information  Last PT Received On: 11/14/11 Assistance Needed: +1    Subjective Data  Subjective: Pt excited about going for his swallow test this AM   Cognition  Overall Cognitive Status: Appears within functional limits for tasks assessed/performed Arousal/Alertness: Awake/alert Orientation Level: Appears  intact for tasks assessed Behavior During Session: [facility] for tasks performed    Balance  Static Standing Balance Static Standing - Balance Support: No upper extremity supported Static Standing - Level of Assistance: 5: Stand by assistance  End of Session PT - End of Session Equipment Utilized During Treatment: Gait belt Activity Tolerance: Patient tolerated treatment well Patient left: in bed;with call bell/phone within reach;with family/visitor present (sitting EOB)   GP     Nathan [name] 11/14/2011, 9:50 AM  [facility] PT [phone]

## 2011-11-14 NOTE — Progress Notes (Signed)
Spoke with patient and his wife at bedside. They are wanting to pursue SNF placement and understand that [facility] will have to approve this or it would be an out of pocket placement. I will proceed with sending info to [facility] for SNF consideration as well as completion of FL2, [place] and begin SNF search. Reece Levy, MSW, [place] Majors [phone]

## 2011-11-14 NOTE — Progress Notes (Signed)
PARENTERAL NUTRITION CONSULT NOTE - FOLLOW UP  Pharmacy Consult for TPN Indication: intolerance to enteral feed/unsuccessful panda placement  No Known Allergies  Patient Measurements: Height: 6' 0.83" (185 cm) Weight: 190 lb 7.6 oz (86.4 kg) IBW/kg (Calculated) : 79.52   Vital Signs: Temp: 97.5 F (36.4 C) (07/05 0507) Temp src: Oral (07/05 0507) BP: 155/65 mmHg (07/05 0632) Pulse Rate: 64  (07/05 0632) Intake/Output from previous day: 07/04 0701 - 07/05 0700 In: 2363.2 [I.V.:1283.2; TPN:1080] Out: 600 [Urine:600] Intake/Output from this shift:    Labs:  [facility] 11/12/11 0520  WBC 13.3*  HGB 9.1*  HCT 28.9*  PLT 361  APTT --  INR --     Basename 11/13/11 0500 11/12/11 0520  NA 141 144  K 3.9 3.7  CL 105 106  CO2 26 29  GLUCOSE 127* 114*  BUN 36* 36*  CREATININE 1.45* 1.65*  LABCREA -- --  CREAT24HRUR -- --  CALCIUM 8.6 8.8  MG 2.1 --  PHOS 3.9 --  PROT 5.4* --  ALBUMIN 2.5* --  AST 47* --  ALT 80* --  ALKPHOS 92 --  BILITOT 1.0 --  BILIDIR -- --  IBILI -- --  PREALBUMIN -- --  TRIG -- --  CHOLHDL -- --  CHOL -- --   Estimated Creatinine Clearance: 51.8 ml/min (by C-G formula based on Cr of 1.45).    Basename 11/13/11 1616 11/13/11 1157 11/13/11 0756  GLUCAP 170* 95 122*    Medical History: Past Medical History  Diagnosis Date  . Aorto-iliac disease     bilat with stent 2007  . CAD (coronary artery disease)     with loop to the circumflex 2000, inferolateral infarction  . Hypertension   . Paroxysmal atrial flutter     ablation  . ED (erectile dysfunction)   . COPD (chronic obstructive pulmonary disease)   . Hyperlipidemia   . Stroke   . Shortness of breath   . GERD (gastroesophageal reflux disease)   . Gout     Medications:  Scheduled:     . antiseptic oral rinse  15 mL Mouth Rinse QID  . aspirin  300 mg Rectal Daily  . budesonide  0.25 mg Nebulization Q6H  . chlorhexidine  15 mL Mouth Rinse BID  . enoxaparin (LOVENOX)  injection  30 mg Subcutaneous Q24H  . metoprolol  5 mg Intravenous Q6H  . sodium chloride  10-40 mL Intracatheter Q12H  . sodium chloride  3 mL Intravenous Q12H  . DISCONTD: insulin aspart  0-9 Units Subcutaneous Q4H    Insulin Requirements in the past 24 hours:  0 units Novolog  Current Nutrition:  npo  Assessment: 71 y/o male patient s/p CABG, POD#14 with dysphagia, unsuccessful panda placement. Patient had PICC line placed and started tpn 7/1.   Clinimix at 90/ml/hr which patient tolerated and is at goal rate. CBG <150.  Goal rate 90 ml/hr will provide 108 gm protein/day.  TPN and lipids at 10 ml/hr MWF will provide an average 1569 Kcal/day.  Plan repeat swallow study tomorrow.   Anticoagulation: lovenox 30mg  daily, lower dose d/t bleeding risk, could potentially increase to 40mg  daily when stable Infectious Disease: afebrile; wbc 13.3 Cardiovascular: h/o CAD/PVD?HTN; s/p CABG, bp 154/65, nsr, on amio at 30mg /hr and lopressor 5mg  q6h Endocrinology: cbg <150 , no h/o DM, will dc ssi Gastrointestinal / Nutrition: h/o GERD; will add pepcid as patient takes prilosec at home, d/c protonix; post op dysphagia, unsuccessful panda placement.  Noted elevated lft,  will watch Neurology: no pain reported Nephrology: K 3.9, SrCr 1.45 Pulmonary: COPD; 99% on ra; on pulmicort and xopenex Hematology / Oncology: h/h low, post op abla PTA Medication Issues: home meds not resumed d/t po status Best Practices: lovenox for dvt px, h2ra for gi px   Nutritional Goals:  2100-2300 kCal, 100-110 grams of protein per day  Plan:  Continue Clinimix E 5/15 at 41ml/h. Intralipid 10 ml/hr on MWF only due to Sport and exercise psychologist. Trace elements on MWF only due to Sport and exercise psychologist. Cont IVF at [facility]. F/u swallow eval and diet advancement. Talbert Cage, PharmD Pager [phone] 11/14/2011,7:13 AM

## 2011-11-14 NOTE — Progress Notes (Signed)
Pt ambulated 350 feet in hallway with RN and rolling walker; pt assisted back to bed; call bell w/i reach; will cont. To monitor.

## 2011-11-15 MED ORDER — TRAMADOL HCL 50 MG PO TABS: 50.0000 mg | ORAL_TABLET | Freq: Three times a day (TID) | ORAL | Status: AC | PRN

## 2011-11-15 MED ORDER — ASPIRIN 325 MG PO TBEC: 325.0000 mg | DELAYED_RELEASE_TABLET | Freq: Every day | ORAL | Status: AC

## 2011-11-15 MED ORDER — RESOURCE THICKENUP CLEAR PO POWD: ORAL | Status: DC

## 2011-11-15 MED ORDER — TRAMADOL HCL 50 MG PO TABS
50.0000 mg | ORAL_TABLET | Freq: Three times a day (TID) | ORAL | Status: DC | PRN
  Administered 2011-11-15 (×3): 50 mg via ORAL
  Filled 2011-11-15 (×3): qty 1

## 2011-11-15 MED ORDER — AMIODARONE HCL 400 MG PO TABS: 400.0000 mg | ORAL_TABLET | Freq: Two times a day (BID) | ORAL | Status: DC

## 2011-11-15 NOTE — Progress Notes (Signed)
11/15/2011 1100 Nursing note  EPW and CTS d/c per orders and per protocol. Ends of EPW intact. Pt. Tolerated well. Last documented INR 6/24 was 1.38. Steri Strips applied with benzoin to CT sites. Pt. Advised of bedrest for one hour. Call bell, urinal and water pitcher within reach. Bed alarm set. Wife at bedside. Frequent vital signs collected per protocol. Will continue to monitor.  Shalise Rosado, Blanchard Kelch

## 2011-11-15 NOTE — Progress Notes (Signed)
Pt refused ambulation at this time. Discharge education completed. Covered procedure, sternal precautions, incision care, diet, home exercise, signs & symptoms, and phase II cardiac rehab. Pt interested in phase II. Will send referral and follow up. Electronically signed by Harriett Sine MS on Saturday November 15 2011 at [phone]

## 2011-11-15 NOTE — Progress Notes (Addendum)
                    [address]            [place] [address]          [phone]     15 Days Post-Op Procedure(s) (LRB): CORONARY ARTERY BYPASS GRAFTING (CABG) (N/A) ENDARTERECTOMY CAROTID (Right)  Subjective: Feels well.  No issues with swallowing, although he dislikes the diet.  Objective: Vital signs in last 24 hours: Patient Vitals for the past 24 hrs:  BP Temp Temp src Pulse Resp SpO2 Weight  11/15/11 0700 - - - - - - 195 lb 12.8 oz (88.814 kg)  11/15/11 0451 143/64 mmHg 98.5 F (36.9 C) Oral 59  18  97 % -  11/14/11 2157 - - - - - 90 % -  11/14/11 2057 153/66 mmHg 98.4 F (36.9 C) Oral 73  18  100 % -  11/14/11 1313 156/65 mmHg 97.7 F (36.5 C) Oral 66  18  99 % -  11/14/11 1307 139/53 mmHg - - 65  - - -  11/14/11 1155 172/58 mmHg - - 72  - - -   Current Weight  11/15/11 195 lb 12.8 oz (88.814 kg)     Intake/Output from previous day: 07/05 0701 - 07/06 0700 In: 640 [P.O.:480; I.V.:160] Out: 1450 [Urine:1450]    PHYSICAL EXAM:  Heart: RRR Lungs: coarse BS bilaterally Wound: clean and dry Extremities: Mild RLE edema    Lab Results: CBC:No results found for this basename: WBC:2,HGB:2,HCT:2,PLT:2 in the last 72 hours BMET:  Basename 11/13/11 0500  NA 141  K 3.9  CL 105  CO2 26  GLUCOSE 127*  BUN 36*  CREATININE 1.45*  CALCIUM 8.6    PT/INR: No results found for this basename: LABPROT,INR in the last 72 hours    Assessment/Plan: S/P Procedure(s) (LRB): CORONARY ARTERY BYPASS GRAFTING (CABG) (N/A) ENDARTERECTOMY CAROTID (Right) CV- BPs trending up.  Will resume home meds since taking po's now. ACE-I held due to increased Cr. Continue D3 diet. Will d/c EPWs Disp- pt now thinks he has someone who can provide 24 hr care at home.  Hopefully can d/c soon.   LOS: 15 days    COLLINS,GINA H 11/15/2011   making arrangements for home care. Patient wants to go home not SNF Will d/c when home arrangements are made I have seen  and examined Nathan [name] and agree with the above assessment  and plan.  Delight Ovens MD Beeper [phone] Office [phone] 11/15/2011 10:41 AM

## 2011-11-15 NOTE — Progress Notes (Addendum)
Subjective:  Feeling well. No SOB, no CP.   Objective:  Vital Signs in the last 24 hours: Temp:  [97.7 F (36.5 C)-98.5 F (36.9 C)] 98.5 F (36.9 C) (07/06 0451) Pulse Rate:  [59-73] 66  (07/06 1115) Resp:  [18] 18  (07/06 0451) BP: (139-172)/(53-84) 152/83 mmHg (07/06 1115) SpO2:  [90 %-100 %] 99 % (07/06 1034) Weight:  [88.814 kg (195 lb 12.8 oz)] 88.814 kg (195 lb 12.8 oz) (07/06 0700)  Intake/Output from previous day: 07/05 0701 - 07/06 0700 In: 640 [P.O.:480; I.V.:160] Out: 1450 [Urine:1450]   Physical Exam: General: Well developed, well nourished, in no acute distress. Head:  Normocephalic and atraumatic. Lungs: Mild inspiratory noise/ wheeze L>R. Heart: Normal S1 and S2.  No murmur, rubs or gallops. Wound C/D/I Abdomen: soft, non-tender, positive bowel sounds. Extremities: No clubbing or cyanosis. Minimal RLE edema. Neurologic: Alert and oriented x 3.    Lab Results: No results found for this basename: WBC:2,HGB:2,PLT:2 in the last 72 hours  Basename 11/13/11 0500  NA 141  K 3.9  CL 105  CO2 26  GLUCOSE 127*  BUN 36*  CREATININE 1.45*   No results found for this basename: TROPONINI:2,CK,MB:2 in the last 72 hours Hepatic Function Panel  Basename 11/13/11 0500  PROT 5.4*  ALBUMIN 2.5*  AST 47*  ALT 80*  ALKPHOS 92  BILITOT 1.0  BILIDIR --  IBILI --  Imaging: Dg Swallowing Func-no Report  11/14/2011  CLINICAL DATA: follow up   FLUOROSCOPY FOR SWALLOWING FUNCTION STUDY:  Fluoroscopy was provided for swallowing function study, which was  administered by a speech pathologist.  Final results and recommendations  from this study are contained within the speech pathology report.       Telemetry: NSR, no AFIB Personally viewed.    Assessment/Plan:  Principal Problem:  *Respiratory failure, post-operative Active Problems:  COPD (chronic obstructive pulmonary disease)  Occlusion and stenosis of carotid artery without mention of cerebral infarction  Purulent bronchitis  S/P CABG (coronary artery bypass graft)  Atrial fibrillation with RVR  Acute renal failure  Parox AFIB  - On amiodarone 400 BID. Continue with taper eventually to 200mg  QD.   - Now in NSR, stable.   HTN/ CKD  - avoiding ACE/ARB with elevated creat.  CAD-post CABG  Nathan [name] 11/15/2011, 11:37 AM

## 2011-11-15 NOTE — Progress Notes (Signed)
CSW following for SNF. Per Consulting civil engineer, Marcelino Duster, dispo now home with [facility]. Anticipate d/c home Sunday 7/7. CSW signing off as no other CSW needs identified. Dellie Burns, MSW, [place] [phone] (weekend)

## 2011-11-15 NOTE — Progress Notes (Signed)
11/15/2011 5:35 PM Nursing note Pt. Ambulated 250 ft with RW, RN and on RA. PT. Tolerated well. Encouraged one more walk this evening.  Blimy Napoleon, Blanchard Kelch

## 2011-11-16 MED ORDER — AMLODIPINE BESYLATE 10 MG PO TABS: 10.0000 mg | ORAL_TABLET | Freq: Every day | ORAL | Status: DC

## 2011-11-16 MED ORDER — SIMVASTATIN 10 MG PO TABS: 10.0000 mg | ORAL_TABLET | Freq: Every day | ORAL | Status: DC

## 2011-11-16 MED ORDER — MOVING RIGHT ALONG BOOK
Freq: Once | Status: DC
  Filled 2011-11-16: qty 1

## 2011-11-16 MED ORDER — METOPROLOL TARTRATE 25 MG PO TABS: 25.0000 mg | ORAL_TABLET | Freq: Two times a day (BID) | ORAL | Status: DC

## 2011-11-16 MED ORDER — AMLODIPINE BESYLATE 10 MG PO TABS
10.0000 mg | ORAL_TABLET | Freq: Every day | ORAL | Status: DC
  Administered 2011-11-16: 10 mg via ORAL
  Filled 2011-11-16 (×2): qty 1

## 2011-11-16 NOTE — Progress Notes (Signed)
11/16/2011 1115 [facility] will provide tub seat and wife will purchase elevated toilet seat at [facility]. Willing to pay out of pocket for tub seat. NCM will cancel order from [employer]. Isidoro Donning RN CCM Case Mgmt phone [phone]

## 2011-11-16 NOTE — Progress Notes (Signed)
Courtesy visit.   Going home.  Excited Will see Dr. Katrinka Blazing in f/u.

## 2011-11-16 NOTE — Progress Notes (Addendum)
                    [address]            [place] [address]          [phone]     16 Days Post-Op Procedure(s) (LRB): CORONARY ARTERY BYPASS GRAFTING (CABG) (N/A) ENDARTERECTOMY CAROTID (Right)  Subjective: No complaints this am. Swallowing stable.  Walking better with assistance.  Objective: Vital signs in last 24 hours: Patient Vitals for the past 24 hrs:  BP Temp Temp src Pulse Resp SpO2 Weight  11/16/11 0829 - - - - - 94 % -  11/16/11 0410 156/78 mmHg 98.6 F (37 C) Oral 64  16  97 % 194 lb 1.6 oz (88.043 kg)  11/16/11 0212 - - - - - 94 % -  11/15/11 2139 144/76 mmHg - - 68  - - -  11/15/11 2037 - - - - - 95 % -  11/15/11 2021 152/74 mmHg 98.4 F (36.9 C) Oral 63  18  96 % -  11/15/11 1525 - - - - - 97 % -  11/15/11 1359 147/57 mmHg - - 60  - - -  11/15/11 1330 142/52 mmHg - - 65  - - -  11/15/11 1256 146/62 mmHg 97.9 F (36.6 C) Oral 61  18  98 % -  11/15/11 1230 160/65 mmHg - - 62  - - -  11/15/11 1200 152/64 mmHg - - 63  - - -  11/15/11 1145 145/60 mmHg - - 63  - - -  11/15/11 1130 146/83 mmHg - - 65  - - -  11/15/11 1115 152/83 mmHg - - 66  - - -  11/15/11 1051 150/84 mmHg - - 65  - - -  11/15/11 1034 - - - - - 99 % -   Current Weight  11/16/11 194 lb 1.6 oz (88.043 kg)     Intake/Output from previous day: 07/06 0701 - 07/07 0700 In: -  Out: 200 [Urine:200]    PHYSICAL EXAM:  Heart: RRR Lungs:clearer today Wound: clean and dry Extremities: +LE edema    Lab Results: CBC:No results found for this basename: WBC:2,HGB:2,HCT:2,PLT:2 in the last 72 hours BMET: No results found for this basename: NA:2,K:2,CL:2,CO2:2,GLUCOSE:2,BUN:2,CREATININE:2,CALCIUM:2 in the last 72 hours  PT/INR: No results found for this basename: LABPROT,INR in the last 72 hours    Assessment/Plan: S/P Procedure(s) (LRB): CORONARY ARTERY BYPASS GRAFTING (CABG) (N/A) ENDARTERECTOMY CAROTID (Right) CV- BPs still elevated.  Since we are holding off on  ACE-I due to increased Cr, will increase home dose of Norvasc. Vol overload- diurese. Home today with HH.   LOS: 16 days    COLLINS,GINA H 11/16/2011   plan d/c today Wife has been instructed on swallowing precautions and diet I have seen and examined Nathan [name] and agree with the above assessment  and plan.  Delight Ovens MD Beeper [phone] Office [phone] 11/16/2011 9:11 AM

## 2011-11-16 NOTE — Progress Notes (Addendum)
11/16/2011 11:42 AM Nursing note Pt. Discharge avs form, medications already taken today and those due this evening marked and reviewed with patient and wife. Follow up appointments and when to call MD also reviewed. Activity restrictions and incision care reviewed. Pt. And family verbalized understanding. Pt. And wife viewed video #113 as well as reviewed the [facility] book with RN. PICC line d/c per IV team RN. Tele d/c. D/c home with wife per orders. Pt. Also sent home with RW, shower chair and elevated toilet seat from [facility]. Contact information for home health also reviewed.  Jadan Rouillard, Blanchard Kelch  Addendum: Discussed pt. Lack of bm to PA. Pt. States he would prefer to take something over the counter at d/c. PA stated this would be ok. Information shared with family. RX reviewed with patient as well as what pharmacy to pick medications up from.

## 2011-11-16 NOTE — Discharge Summary (Signed)
[address]            [place] [address]          [phone]         Discharge Summary  Name: Nathan [name] DOB: 1940/10/03 71 y.o. MRN: [id number]   Admission Date: 10/31/2011 Discharge Date:     Admitting Diagnosis: Coronary artery disease Right ICA stenosis   Discharge Diagnosis:  Coronary artery disease Right ICA stenosis Postoperative respiratory failure requiring reintubation Postoperative atrial fibrillation Postoperative acute renal insufficiency Postoperative bronchitis Postoperative dysphagia Deconditioning Ongoing tobacco abuse  Past Medical History  Diagnosis Date  . Aorto-iliac disease     bilat with stent 2007  . CAD (coronary artery disease)     with loop to the circumflex 2000, inferolateral infarction  . Hypertension   . Paroxysmal atrial flutter     ablation  . ED (erectile dysfunction)   . COPD (chronic obstructive pulmonary disease)   . Hyperlipidemia   . Stroke   . Shortness of breath   . GERD (gastroesophageal reflux disease)   . Gout     Procedures: Procedure(s):  CORONARY ARTERY BYPASS GRAFTING x 4 (left internal mammary artery to the LAD, saphenous vein graft to the obtuse marginal, saphenous vein graft to the distal right coronary artery, saphenous vein graft to the diagonal) endoscopic vein harvest right leg  ENDARTERECTOMY CAROTID (RIGHT) on 10/31/2011   HPI:  The patient is a 71 y.o. 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 A stress test at the [facility] on March 2013 reportedly showed a reversible defect at the apex and the anterior wall with fixed defect of the RCA and lateral wall. The patient denies any anginal-type symptoms but does report exertional shortness of breath and left leg claudication that is limiting. It was felt that he would require some peripheral vascular surgery for his left leg claudication, and he was seen  by Dr. Katrinka Blazing for cardiac workup in preparation for surgery. Catheterization was performed which revealed significant three-vessel coronary artery disease with total occlusion of the circumflex, a high-grade diffuse obstruction right coronary artery and borderline significant mid LAD disease. Left ventricular ejection fraction was 50-55% with moderate inferobasilar hypokinesis/akinesis. He was referred to Dr. Tyrone Sage for consideration of coronary revascularization prior to proceeding with peripheral vascular surgery. It was felt that he would benefit from CABG at this time. In the course of his workup, he was noted to have a greater than 80% right distal internal carotid artery stenosis with evidence of previous bilateral lacunar infarcts. The patient was seen by Dr. Durene Cal, and several options for management of his carotid stenosis were discussed with the patient and his family. He elected to proceed with a combined right carotid endarterectomy and coronary artery bypass grafting. All risks, benefits and alternatives of surgery were explained in detail, and the patient agreed to proceed.    Hospital Course:  The patient was admitted to [facility] on 10/31/2011. The patient was taken to the operating room and underwent the above procedure.    The postoperative course was initially notable for early respiratory insufficiency requiring reintubation on 11/01/2011. He was treated with antibiotic therapy for presumed bronchitis with purulent secretions. He was treated with aggressive pulmonary toilet measures, breathing treatments and slow vent weaning. Ultimately he was able to be extubated on  11/06/2011. His sputum cultures yielded no growth, his white blood cell count stabilized and all antibiotics were discontinued at that time as well. He developed rapid atrial fibrillation and was started on amiodarone. He did convert to normal sinus rhythm.  He developed renal insufficiency, with a peak creatinine of  3. He was treated with diuresis for fluid overload and careful volume management, and his renal function has returned to baseline. Following his prolonged course on the ventilator, he developed dysphagia and was kept n.p.o. until his swallowing function improved. Tube feeds were started via a [name] tube for nutritional support. He showed slow and steady improvement, and was able to be transferred to the step down unit on 11/11/2011.  He has continued to slowly progress. He is working with physical therapy on ambulation and mobility. A repeat swallowing study was performed on 11/14/18/2013, and the patient was able to be started on a dysphagia 3 diet with thickened liquids. He is tolerating this without difficulty. He has been restarted on medications by mouth. His blood pressures have been somewhat elevated and his medications have been titrated accordingly. He has not been restarted on an ACE inhibitor due to the slight increase in his creatinine. He is diuresing well. He appears neurologically intact. All incisions are healing well. He has been evaluated on the morning of this dictation and is ready for discharge home at this time. Home health has been arranged for physical therapy, continued nursing care, and a repeat swallow study in the next week for potential advancement of his diet.    Recent vital signs:  Filed Vitals:   11/16/11 0921  BP: 148/63  Pulse: 63  Temp:   Resp:     Recent laboratory studies:  CBC:No results found for this basename: WBC:2,HGB:2,HCT:2,PLT:2 in the last 72 hours BMET: No results found for this basename: NA:2,K:2,CL:2,CO2:2,GLUCOSE:2,BUN:2,CREATININE:2,CALCIUM:2 in the last 72 hours  PT/INR: No results found for this basename: LABPROT,INR in the last 72 hours   Discharge Medications:   Medication List  As of 11/16/2011 10:21 AM   STOP taking these medications         isosorbide mononitrate 30 MG 24 hr tablet      lisinopril 20 MG tablet      nitroGLYCERIN 0.4  MG SL tablet         TAKE these medications         allopurinol 300 MG tablet   Commonly known as: ZYLOPRIM   Take 300 mg by mouth daily.      amiodarone 400 MG tablet   Commonly known as: PACERONE   Take 1 tablet (400 mg total) by mouth 2 (two) times daily. X 1 week, then decrease to 200 mg po BID      amLODipine 10 MG tablet   Commonly known as: NORVASC   Take 1 tablet (10 mg total) by mouth daily.      aspirin 325 MG EC tablet   Take 1 tablet (325 mg total) by mouth daily.      buPROPion 150 MG 12 hr tablet   Commonly known as: WELLBUTRIN SR   Take 150 mg by mouth 2 (two) times daily.      colchicine 0.6 MG tablet   Take 1 tablet (0.6 mg total) by mouth daily.      metoprolol 50 MG tablet   Commonly known as: LOPRESSOR   Take 25 mg by mouth 2 (two) times daily.      omeprazole 20 MG capsule  Commonly known as: PRILOSEC   Take 20 mg by mouth daily.      RESOURCE THICKENUP CLEAR Powd   Use as directed      simvastatin 10 MG tablet   Commonly known as: ZOCOR   Take 10 mg by mouth at bedtime.      traMADol 50 MG tablet   Commonly known as: ULTRAM   Take 1 tablet (50 mg total) by mouth every 8 (eight) hours as needed for pain.             Discharge Instructions:  The patient is to refrain from driving, heavy lifting or strenuous activity.  May shower daily and clean incisions with soap and water. Dysphagia 3 diet with thickened liquids.   Follow Up:  Discharge Orders    Future Appointments: Provider: Department: Dept Phone: Center:   12/01/2011 10:30 AM Nada Libman, MD [facility] [phone] [facility]     Future Orders Please Complete By Expires   Amb Referral to Cardiac Rehabilitation         Follow-up Information    Follow up with Myra Gianotti IV, Lala Lund, MD in 2 weeks. (office will arrange -sent)    Contact information:   [address] [place] [address] [phone]       Follow up with Sheliah Plane B, MD in 3 weeks. (Office  will call to arrange appointment)    Contact information:   [address] [place] [address] [phone]       Follow up with Lesleigh Noe, MD. Schedule an appointment as soon as possible for a visit in 2 weeks.   Contact information:   [address] [place] [address] [place] [address] [phone]       Follow up with [facility]. (Home Health Physical Therapy, Speech Therapy and RN)    Contact information:   [phone]      Follow up with [facility]. (Request for 3n1 bedside commode/elevated toilet seat, and shower stool have  been sent to [facility]. Please contact them on 7/8, Monday to schedule a delivery time. )    Contact information:   [phone]          Isaah Furry H 11/16/2011, 10:21 AM

## 2011-11-17 ENCOUNTER — Other Ambulatory Visit: Payer: Self-pay | Admitting: *Deleted

## 2011-11-17 ENCOUNTER — Telehealth: Payer: Self-pay | Admitting: *Deleted

## 2011-11-17 DIAGNOSIS — R600 Localized edema: Secondary | ICD-10-CM

## 2011-11-17 DIAGNOSIS — F329 Major depressive disorder, single episode, unspecified: Secondary | ICD-10-CM

## 2011-11-17 DIAGNOSIS — M109 Gout, unspecified: Secondary | ICD-10-CM

## 2011-11-17 MED ORDER — BUPROPION HCL ER (SR) 150 MG PO TB12: 150.0000 mg | ORAL_TABLET | Freq: Two times a day (BID) | ORAL | Status: DC

## 2011-11-17 MED ORDER — FUROSEMIDE 40 MG PO TABS: 40.0000 mg | ORAL_TABLET | Freq: Every day | ORAL | Status: DC

## 2011-11-17 MED ORDER — COLCHICINE 0.6 MG PO TABS: 0.6000 mg | ORAL_TABLET | Freq: Every day | ORAL | Status: DC

## 2011-11-17 NOTE — Telephone Encounter (Signed)
Mr. Nathan [name] is concerned with the considerable swelling he is having in his endo vein harvesting leg....specifically the ankle and foot....even after elevation.  I consulted Dr. Tyrone Sage.  He ordered Lasix 40mg m for 5 days only. I relayed this to him and he understands.

## 2011-11-27 ENCOUNTER — Other Ambulatory Visit: Payer: Self-pay | Admitting: Physician Assistant

## 2011-11-28 ENCOUNTER — Encounter: Payer: Self-pay | Admitting: Surgery

## 2011-12-01 ENCOUNTER — Encounter: Payer: Self-pay | Admitting: Surgery

## 2011-12-01 ENCOUNTER — Ambulatory Visit (INDEPENDENT_AMBULATORY_CARE_PROVIDER_SITE_OTHER): Payer: Medicare PPO | Admitting: Surgery

## 2011-12-01 VITALS — BP 129/52 | HR 56 | Resp 18 | Ht 73.0 in | Wt 185.6 lb

## 2011-12-01 DIAGNOSIS — I6529 Occlusion and stenosis of unspecified carotid artery: Secondary | ICD-10-CM

## 2011-12-01 NOTE — Progress Notes (Signed)
The patient is back today for followup. He underwent right carotid endarterectomy in the setting of coronary artery bypass grafting. He had a rather difficult postoperative course, mainly due to pulmonary issues, requiring reintubation. He has had difficulty with aspiration and hoarseness.  At home he has not been very active. He has had issues with nausea and vomiting and has lost a fair amount of weight. His energy level is very low at this point.  On examination: The right carotid incision has separated in the inferior aspect. The gap was approximately 2 mm. There is no evidence of infection. It does appear to be healing secondarily.  I have encouraged the patient to begin increasing his diet as well as his activity level. He is due to have physical therapy come tomorrow. I meant to schedule him to see me again in one month to make sure he is continuing to improve.

## 2011-12-03 ENCOUNTER — Encounter (HOSPITAL_COMMUNITY): Payer: Self-pay | Admitting: *Deleted

## 2011-12-03 ENCOUNTER — Emergency Department (HOSPITAL_COMMUNITY): Payer: Medicare PPO

## 2011-12-03 ENCOUNTER — Emergency Department (HOSPITAL_COMMUNITY)
Admission: EM | Admit: 2011-12-03 | Discharge: 2011-12-03 | Disposition: A | Discharge status: Home / Self Care | Payer: Medicare PPO | Attending: Emergency Medicine | Admitting: Emergency Medicine

## 2011-12-03 DIAGNOSIS — Z951 Presence of aortocoronary bypass graft: Secondary | ICD-10-CM | POA: Insufficient documentation

## 2011-12-03 DIAGNOSIS — Z79899 Other long term (current) drug therapy: Secondary | ICD-10-CM | POA: Insufficient documentation

## 2011-12-03 DIAGNOSIS — E785 Hyperlipidemia, unspecified: Secondary | ICD-10-CM | POA: Insufficient documentation

## 2011-12-03 DIAGNOSIS — R197 Diarrhea, unspecified: Secondary | ICD-10-CM | POA: Insufficient documentation

## 2011-12-03 DIAGNOSIS — K219 Gastro-esophageal reflux disease without esophagitis: Secondary | ICD-10-CM | POA: Insufficient documentation

## 2011-12-03 DIAGNOSIS — I10 Essential (primary) hypertension: Secondary | ICD-10-CM | POA: Insufficient documentation

## 2011-12-03 DIAGNOSIS — Z8673 Personal history of transient ischemic attack (TIA), and cerebral infarction without residual deficits: Secondary | ICD-10-CM | POA: Insufficient documentation

## 2011-12-03 DIAGNOSIS — J4489 Other specified chronic obstructive pulmonary disease: Secondary | ICD-10-CM | POA: Insufficient documentation

## 2011-12-03 DIAGNOSIS — J449 Chronic obstructive pulmonary disease, unspecified: Secondary | ICD-10-CM | POA: Insufficient documentation

## 2011-12-03 DIAGNOSIS — I2510 Atherosclerotic heart disease of native coronary artery without angina pectoris: Secondary | ICD-10-CM | POA: Insufficient documentation

## 2011-12-03 DIAGNOSIS — E860 Dehydration: Secondary | ICD-10-CM | POA: Insufficient documentation

## 2011-12-03 LAB — CBC
HCT: 33.8 % — ABNORMAL LOW (ref 39.0–52.0)
MCV: 90.1 fL (ref 78.0–100.0)
Platelets: 146 10*3/uL — ABNORMAL LOW (ref 150–400)
RBC: 3.75 MIL/uL — ABNORMAL LOW (ref 4.22–5.81)
WBC: 6.3 10*3/uL (ref 4.0–10.5)

## 2011-12-03 LAB — POCT I-STAT, CHEM 8
Calcium, Ion: 1.23 mmol/L (ref 1.13–1.30)
Creatinine, Ser: 2.1 mg/dL — ABNORMAL HIGH (ref 0.50–1.35)
HCT: 33 % — ABNORMAL LOW (ref 39.0–52.0)
Sodium: 139 mEq/L (ref 135–145)
TCO2: 26 mmol/L (ref 0–100)

## 2011-12-03 LAB — URINALYSIS, ROUTINE W REFLEX MICROSCOPIC
Hgb urine dipstick: NEGATIVE
Nitrite: NEGATIVE
Specific Gravity, Urine: 1.024 (ref 1.005–1.030)
pH: 6 (ref 5.0–8.0)

## 2011-12-03 LAB — BASIC METABOLIC PANEL
BUN: 20 mg/dL (ref 6–23)
CO2: 29 mEq/L (ref 19–32)
Calcium: 9.4 mg/dL (ref 8.4–10.5)
Creatinine, Ser: 1.99 mg/dL — ABNORMAL HIGH (ref 0.50–1.35)
GFR calc Af Amer: 37 mL/min — ABNORMAL LOW (ref >90)

## 2011-12-03 MED ORDER — SODIUM CHLORIDE 0.9 % IV BOLUS (SEPSIS): 1000.0000 mL | Freq: Once | INTRAVENOUS | Status: DC

## 2011-12-03 NOTE — ED Notes (Signed)
Pt refusing IV and fluids at this time stating he will go home and drink 6-8 glassless of water instead. Stressed the importance of IV fluids to pt but he still refused. Tawanna Cooler, PA notified.

## 2011-12-03 NOTE — Progress Notes (Signed)
Medical screening examination/treatment/procedure(s) were conducted as a shared visit with non-physician practitioner(s) and myself.  I personally evaluated the patient during the encounter 71 year old man with poor appetite, not eating or drinking much.  Had recent CABG, and recent right carotid endarterectomy. His wife is concerned that he could have pneumonia or a urinary tract infection. Exam shows him to be an elderly man in no distress.  Wounds healing well.  Lungs clear, heart sounds normal.  Lab workup benign.  Advised to drink at least 6 glasses of water to maintain hydration, and released.

## 2011-12-03 NOTE — ED Provider Notes (Signed)
History     CSN: [id number]  Arrival date & time 12/03/11  1754   First MD Initiated Contact with Patient 12/03/11 1907      Chief Complaint  Patient presents with  . Weakness  . Urinary Tract Infection  . Diarrhea    (Consider location/radiation/quality/duration/timing/severity/associated sxs/prior treatment) HPI  Patient presents to emergency department with his wife at bedside with concern of dehydration, weakness, and some loose stool. Wife states that since patient is discharged from the hospital on July 5 after having a complicated hospital stay after a right endarterectomy and a CABG he has been taking in very limited by mouth with her stating that he eats and drinks very little despite her constant urging of fluids and food. She is concerned that he is generally weak, sleeping a lot through out the day though she states she is unchanging since d/c. His PCP is through the [place] and he does not have an appointment until Aug 5th for labs. She states that on a few occasions he has complained of some mild burning with the initiation of urinary stream as well as continuing to cough at night before bed. Patient states he coughs before bed to "clear my chest" and then states he is able to go to sleep without difficulty denying night time coughing, SOB, orthopnea or PND however he does state that his sputum as darkened from white to a golden brown since d/c from hospital. Patient states he does not want to be in the hospital and is "only here because of my nosey wife." Wife states he biggest concern is lack of close PCP follow up with concern of possible dehydration and "wants to make sure he does not have pneumonia or a urinary tract infection." Patient denies fevers, chills, CP, SOB, abdominal pain, n/v, hematuria, blood in stool. patient states some mild intermittent diarrhea.   Abbreviated d/c summary below.   Name: Nathan [name]  DOB: March 10, 1941 71 y.o.  MRN: [id number]  Admission Date:  10/31/2011  Discharge Date:  11/14/11 Admitting Diagnosis:  Coronary artery disease  Right ICA stenosis  Discharge Diagnosis:  Coronary artery disease  Right ICA stenosis  Postoperative respiratory failure requiring reintubation  Postoperative atrial fibrillation  Postoperative acute renal insufficiency  Postoperative bronchitis  Postoperative dysphagia  Deconditioning  Ongoing tobacco abuse  Past Medical History   Diagnosis  Date   .  Aorto-iliac disease      bilat with stent 2007   .  CAD (coronary artery disease)      with loop to the circumflex 2000, inferolateral infarction   .  Hypertension    .  Paroxysmal atrial flutter      ablation   .  ED (erectile dysfunction)    .  COPD (chronic obstructive pulmonary disease)    .  Hyperlipidemia    .  Stroke    .  Shortness of breath    .  GERD (gastroesophageal reflux disease)    .  Gout    Procedures:  Procedure(s):  CORONARY ARTERY BYPASS GRAFTING x 4 (left internal mammary artery to the LAD, saphenous vein graft to the obtuse marginal, saphenous vein graft to the distal right coronary artery, saphenous vein graft to the diagonal) endoscopic vein harvest right leg ENDARTERECTOMY CAROTID (RIGHT) on 10/31/2011 HPI: The patient is a 71 y.o. 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 A stress test at the [facility] on March 2013 reportedly  showed a reversible defect at the apex and the anterior wall with fixed defect of the RCA and lateral wall. The patient denies any anginal-type symptoms but does report exertional shortness of breath and left leg claudication that is limiting. It was felt that he would require some peripheral vascular surgery for his left leg claudication, and he was seen by Dr. Katrinka Blazing for cardiac workup in preparation for surgery. Catheterization was performed which revealed significant three-vessel coronary artery disease with total occlusion of the circumflex, a  high-grade diffuse obstruction right coronary artery and borderline significant mid LAD disease. Left ventricular ejection fraction was 50-55% with moderate inferobasilar hypokinesis/akinesis. He was referred to Dr. Tyrone Sage for consideration of coronary revascularization prior to proceeding with peripheral vascular surgery. It was felt that he would benefit from CABG at this time. In the course of his workup, he was noted to have a greater than 80% right distal internal carotid artery stenosis with evidence of previous bilateral lacunar infarcts. The patient was seen by Dr. Durene Cal, and several options for management of his carotid stenosis were discussed with the patient and his family. He elected to proceed with a combined right carotid endarterectomy and coronary artery bypass grafting. All risks, benefits and alternatives of surgery were explained in detail, and the patient agreed to proceed.  Hospital Course: The patient was admitted to [facility] on 10/31/2011.  The patient was taken to the operating room and underwent the above procedure.  The postoperative course was initially notable for early respiratory insufficiency requiring reintubation on 11/01/2011. He was treated with antibiotic therapy for presumed bronchitis with purulent secretions. He was treated with aggressive pulmonary toilet measures, breathing treatments and slow vent weaning. Ultimately he was able to be extubated on 11/06/2011. His sputum cultures yielded no growth, his white blood cell count stabilized and all antibiotics were discontinued at that time as well.  He developed rapid atrial fibrillation and was started on amiodarone. He did convert to normal sinus rhythm. He developed renal insufficiency, with a peak creatinine of 3. He was treated with diuresis for fluid overload and careful volume management, and his renal function has returned to baseline. Following his prolonged course on the ventilator, he developed dysphagia  and was kept n.p.o. until his swallowing function improved. Tube feeds were started via a [name] tube for nutritional support. He showed slow and steady improvement, and was able to be transferred to the step down unit on 11/11/2011.  He has continued to slowly progress. He is working with physical therapy on ambulation and mobility. A repeat swallowing study was performed on 11/14/18/2013, and the patient was able to be started on a dysphagia 3 diet with thickened liquids. He is tolerating this without difficulty. He has been restarted on medications by mouth. His blood pressures have been somewhat elevated and his medications have been titrated accordingly. He has not been restarted on an ACE inhibitor due to the slight increase in his creatinine. He is diuresing well. He appears neurologically intact. All incisions are healing well. He has been evaluated on the morning of this dictation and is ready for discharge home at this time. Home health has been arranged for physical therapy, continued nursing care, and a repeat swallow study in the next week for potential advancement of his diet.   Past Medical History  Diagnosis Date  . Aorto-iliac disease     bilat with stent 2007  . CAD (coronary artery disease)     with loop to the circumflex  2000, inferolateral infarction  . Hypertension   . Paroxysmal atrial flutter     ablation  . ED (erectile dysfunction)   . COPD (chronic obstructive pulmonary disease)   . Hyperlipidemia   . Stroke   . Shortness of breath   . GERD (gastroesophageal reflux disease)   . Gout     Past Surgical History  Procedure Date  . Spinal fusion   . Hand surgery   . Neck fusion 1975  . Eye surgery   . Coronary artery bypass graft 10/31/2011    Procedure: CORONARY ARTERY BYPASS GRAFTING (CABG);  Surgeon: Delight Ovens, MD;  Location: [facility] OR;  Service: Open Heart Surgery;  Laterality: N/A;  times three using  Left Internal Mammary Artery and Right Greater Saphenous Vein  Graft harvested endoscopically; TEE  . Endarterectomy 10/31/2011    Procedure: ENDARTERECTOMY CAROTID;  Surgeon: Nada Libman, MD;  Location[facility] OR;  Service: Vascular;  Laterality: Right;  with patch angioplasty   . Carotid endarterectomy 10-31-2011    right    Family History  Problem Relation Age of Onset  . Cancer Mother     History  Substance Use Topics  . Smoking status: Smoker, Current Status Unknown -- 1.0 packs/day for 60 years    Types: Cigarettes  . Smokeless tobacco: Never Used   Comment: smoking cessation info given and reviewed  . 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    Allergies  Review of patient's allergies indicates no known allergies.  Home Medications   Current Outpatient Rx  Name Route Sig Dispense Refill  . ALLOPURINOL 300 MG PO TABS Oral Take 300 mg by mouth daily.    . AMIODARONE HCL 400 MG PO TABS Oral Take 1 tablet (400 mg total) by mouth 2 (two) times daily. X 1 week, then decrease to 200 mg po BID 75 tablet 1  . AMLODIPINE BESYLATE 10 MG PO TABS Oral Take 1 tablet (10 mg total) by mouth daily. 30 tablet 1  . BUPROPION HCL ER (SR) 150 MG PO TB12 Oral Take 1 tablet (150 mg total) by mouth 2 (two) times daily. 60 tablet 0  . COLCHICINE 0.6 MG PO TABS Oral Take 1 tablet (0.6 mg total) by mouth daily. 30 tablet 2  . METOPROLOL TARTRATE 25 MG PO TABS Oral Take 1 tablet (25 mg total) by mouth 2 (two) times daily. 60 tablet 1  . OMEPRAZOLE 20 MG PO CPDR Oral Take 20 mg by mouth daily.    Marland Kitchen SIMVASTATIN 10 MG PO TABS Oral Take 1 tablet (10 mg total) by mouth at bedtime. 30 tablet 1  . TRAMADOL HCL 50 MG PO TABS  TAKE 1 TABLET EVERY 8 HOURS AS NEEDED PAIN 30 tablet 0    BP 118/49  Pulse 109  Temp 97.8 F (36.6 C) (Oral)  Resp 16  SpO2 100%  Physical Exam  Nursing note and  vitals reviewed. Constitutional: He is oriented to person, place, and time. He appears well-developed and well-nourished. No distress.  HENT:  Head: Normocephalic and atraumatic.  Eyes: Conjunctivae and EOM are normal. Pupils are equal, round, and reactive to light.  Neck: Normal range of motion. Neck supple.       Well healing surgical  wound of right neck without signs of infection.   Cardiovascular: Normal rate, regular rhythm, normal heart sounds and intact distal pulses.  Exam reveals no gallop and no friction rub.   No murmur heard. Pulmonary/Chest: Effort normal and breath sounds normal. No respiratory distress. He has no wheezes. He has no rales. He exhibits no tenderness.  Abdominal: Soft. Bowel sounds are normal. He exhibits no distension and no mass. There is no tenderness. There is no rebound and no guarding.  Musculoskeletal: Normal range of motion. He exhibits tenderness. He exhibits no edema.       1+ pitting edema of bilateral lower legs.   Neurological: He is alert and oriented to person, place, and time. No cranial nerve deficit. Coordination normal.  Skin: Skin is warm and dry. No rash noted. He is not diaphoretic. No erythema.  Psychiatric: He has a normal mood and affect.    ED Course  Procedures (including critical care time)  IV fluids.   Labs Reviewed  CBC - Abnormal; Notable for the following:    RBC 3.75 (*)     Hemoglobin 10.7 (*)     HCT 33.8 (*)     RDW 15.8 (*)     Platelets 146 (*)     All other components within normal limits  URINALYSIS, ROUTINE W REFLEX MICROSCOPIC - Abnormal; Notable for the following:    Bilirubin Urine SMALL (*)     All other components within normal limits  BASIC METABOLIC PANEL - Abnormal; Notable for the following:    Creatinine, Ser 1.99 (*)     GFR calc non Af Amer 32 (*)     GFR calc Af Amer 37 (*)     All other components within normal limits  POCT I-STAT, CHEM 8 - Abnormal; Notable for the following:    Creatinine,  Ser 2.10 (*)     Hemoglobin 11.2 (*)     HCT 33.0 (*)     All other components within normal limits   Dg Chest 2 View  12/03/2011  *RADIOLOGY REPORT*  Clinical Data: Shortness of breath and weakness.  CHEST - 2 VIEW  Comparison: 11/09/2011  Findings: Stable chronic lung disease.  No overt edema or infiltrate is identified.  The heart size is stable and within normal limits.  There is evidence of prior CABG.  No pleural effusions are identified.  No bony abnormalities.  IMPRESSION: Stable chronic lung disease.  No acute findings.  Original Report Authenticated By: Reola Calkins, M.D.     1. Dehydration     Case discussed with Dr. Ignacia Palma who is agreeable with assessment and plan.   MDM  No evidence of acute or emergent findings on labs or physical exam. Patient does have some mild dehydration per labs and his history of poor by mouth intake. I spoke at length with patient and his wife about the need for stressing good oral intake of fluids despite a decreased appetite. Patient voices his understanding and is agreeable plan. He denies any chest pain or shortness of breath throughout ER stay or since d/c from hospital. No acute findings on EKG. Patient does have a followup visit with his primary care provider at the beginning of August but I urged them to call the [place] tomorrow to see if that  could be moved to an earlier time. He is ambulating without difficulty and this time think is appropriate to be discharged home bc he does have in home health, as well as physical  therapy and speech therapy come to the home on regular basis.        Drucie Opitz, [place] 12/03/11 2138  Drucie Opitz, [place] 12/03/11 2138

## 2011-12-03 NOTE — ED Provider Notes (Signed)
9:07 PM  Date: 12/03/2011  Rate: 61  Rhythm: normal sinus rhythm  QRS Axis: right  Intervals: PR prolonged  ST/T Wave abnormalities: nonspecific ST/T changes--in lateral precordial leads.  Conduction Disutrbances:first-degree A-V block   Narrative Interpretation: Borderline EKG.  Old EKG Reviewed: changes noted--Q waves in inferior leads seen on EKG of 11/01/2011 have normalized.    Carleene Cooper III, MD 12/03/11 2109

## 2011-12-03 NOTE — ED Notes (Signed)
Pt has previous LDA's still in place to Rt neck and center chest that appear to be well approximated and healing well.

## 2011-12-03 NOTE — ED Notes (Addendum)
To ED for eval of decreased outpt/intake. Recent CABG and endarterectomy. Released July 5th from hospital. Only nutrition is ice cream recently. Pt has speech, physical, and hhn at home. Pt has been diarrhea since home.

## 2011-12-04 NOTE — ED Provider Notes (Signed)
Medical screening examination/treatment/procedure(s) were conducted as a shared visit with non-physician practitioner(s) and myself. I personally evaluated the patient during the encounter  71 year old man with poor appetite, not eating or drinking much. Had recent CABG, and recent right carotid endarterectomy. His wife is concerned that he could have pneumonia or a urinary tract infection.  Exam shows him to be an elderly man in no distress. Wounds healing well. Lungs clear, heart sounds normal. Lab workup benign. Advised to drink at least 6 glasses of water to maintain hydration, and released.       Carleene Cooper III, MD 12/04/11 [phone]

## 2011-12-08 ENCOUNTER — Other Ambulatory Visit: Payer: Self-pay | Admitting: Cardiothoracic Surgery

## 2011-12-08 DIAGNOSIS — I2510 Atherosclerotic heart disease of native coronary artery without angina pectoris: Secondary | ICD-10-CM

## 2011-12-09 ENCOUNTER — Other Ambulatory Visit: Payer: Self-pay | Admitting: Cardiothoracic Surgery

## 2011-12-09 DIAGNOSIS — I2510 Atherosclerotic heart disease of native coronary artery without angina pectoris: Secondary | ICD-10-CM

## 2011-12-11 ENCOUNTER — Ambulatory Visit: Payer: Medicare PPO

## 2011-12-11 ENCOUNTER — Other Ambulatory Visit: Payer: Self-pay | Admitting: Physician Assistant

## 2011-12-11 ENCOUNTER — Other Ambulatory Visit: Payer: Self-pay | Admitting: *Deleted

## 2011-12-11 DIAGNOSIS — G8918 Other acute postprocedural pain: Secondary | ICD-10-CM

## 2011-12-11 MED ORDER — TRAMADOL HCL 50 MG PO TABS: 50.0000 mg | ORAL_TABLET | Freq: Four times a day (QID) | ORAL | Status: DC | PRN

## 2011-12-15 ENCOUNTER — Ambulatory Visit (INDEPENDENT_AMBULATORY_CARE_PROVIDER_SITE_OTHER): Payer: Self-pay | Admitting: Physician Assistant

## 2011-12-15 ENCOUNTER — Ambulatory Visit
Admission: RE | Admit: 2011-12-15 | Discharge: 2011-12-15 | Disposition: A | Discharge status: Home / Self Care | Payer: Medicare PPO | Source: Ambulatory Visit | Attending: Cardiothoracic Surgery | Admitting: Cardiothoracic Surgery

## 2011-12-15 ENCOUNTER — Other Ambulatory Visit: Payer: Self-pay | Admitting: Cardiothoracic Surgery

## 2011-12-15 VITALS — BP 115/63 | HR 65 | Resp 16 | Ht 73.0 in | Wt 179.0 lb

## 2011-12-15 DIAGNOSIS — I779 Disorder of arteries and arterioles, unspecified: Secondary | ICD-10-CM

## 2011-12-15 DIAGNOSIS — R1110 Vomiting, unspecified: Secondary | ICD-10-CM

## 2011-12-15 DIAGNOSIS — Z09 Encounter for follow-up examination after completed treatment for conditions other than malignant neoplasm: Secondary | ICD-10-CM

## 2011-12-15 DIAGNOSIS — K5900 Constipation, unspecified: Secondary | ICD-10-CM

## 2011-12-15 DIAGNOSIS — I2510 Atherosclerotic heart disease of native coronary artery without angina pectoris: Secondary | ICD-10-CM

## 2011-12-15 DIAGNOSIS — R197 Diarrhea, unspecified: Secondary | ICD-10-CM

## 2011-12-15 NOTE — Progress Notes (Signed)
HPI:  Patient returns for routine postoperative follow-up having undergone Right CEA and CABG x4 on 10/31/2011. The patient's early postoperative recovery while in the hospital was notable for respiratory distress, Atrial fibrillation, dysphagia requiring tube feeds. Since hospital discharge the patient reports he has not been doing very well.  He complains of nausea, vomiting and diarrhea and constipation.  He states that he had diarrhea while in the hospital and has continued to have it since discharge.  He states however that it will alternate with constipation.  He also states that he vomits multiple times per day.  He states it is usually associated with eating and can happen a minutes after ingestion or a few hours.  The patient states he feels like the food has gone down.  He states he has lost 20lbs since the start of all his recent problems.  Of note the patient was discharged home on a dysphagia 3 diet, but has since been cleared to full diet.  He is scheduled to establish care with Dr. Valentina Lucks on 12/18/2011     Current Outpatient Prescriptions  Medication Sig Dispense Refill  . allopurinol (ZYLOPRIM) 300 MG tablet Take 300 mg by mouth daily.      Marland Kitchen amiodarone (PACERONE) 400 MG tablet Take 1 tablet (400 mg total) by mouth 2 (two) times daily. X 1 week, then decrease to 200 mg po BID  75 tablet  1  . amLODipine (NORVASC) 10 MG tablet Take 1 tablet (10 mg total) by mouth daily.  30 tablet  1  . aspirin 325 MG EC tablet Take 325 mg by mouth daily.      Marland Kitchen buPROPion (WELLBUTRIN SR) 150 MG 12 hr tablet Take 1 tablet (150 mg total) by mouth 2 (two) times daily.  60 tablet  0  . colchicine 0.6 MG tablet Take 1 tablet (0.6 mg total) by mouth daily.  30 tablet  2  . furosemide (LASIX) 40 MG tablet Take 40 mg by mouth daily.      . metoprolol tartrate (LOPRESSOR) 25 MG tablet Take 12.5 mg by mouth 2 (two) times daily.      Marland Kitchen omeprazole (PRILOSEC) 20 MG capsule Take 20 mg by mouth daily. 1/2 before food       . simvastatin (ZOCOR) 10 MG tablet Take 1 tablet (10 mg total) by mouth at bedtime.  30 tablet  1  . traMADol (ULTRAM) 50 MG tablet Take 1 tablet (50 mg total) by mouth every 6 (six) hours as needed for pain.  30 tablet  0  . DISCONTD: metoprolol (LOPRESSOR) 25 MG tablet Take 1 tablet (25 mg total) by mouth 2 (two) times daily.  60 tablet  1    Physical Exam: BP 115/63  Pulse 65  Resp 16  Ht 6\' 1"  (1.854 m)  Wt 179 lb (81.194 kg)  BMI 23.62 kg/m2  SpO2 100%  Gen: no apparent distress Lungs: CTA bilaterally Heart: RRR, sternum stable Abd: soft non-tender non-distended Skin: wounds healing well Neuro: grossly intact  Diagnostic Tests:  CXR: no evidence of pneumothorax or pleural effusion, stable   Impression:  Mr. Slatten is S/P R CEA/CABG x4 from 10/31/2011.  From surgery standpoint Mr. Ellen is doing well.  He denies chest pain and shortness of breath.  In regards to his nausea, vomiting, diarrhea, and constipation I do not feel this is related to his cardiac surgery.  I did explain to the patient that after bouts of diarrhea that it may take several days  for his bowel habits to normalize and I would advise against using laxatives unless he becomes uncomfortable.  The patient did receive a period of Vancomycin while hospitalized and I feel obtaining a C. Diff toxin may not be unreasonable.  In regards to the patients vomiting, I encouraged him to eat what he tolerates.  If any food does not appear to be digesting or going down he should avoid that food.    Plan:  The patient should return to clinic in 4 weeks time for follow up with Dr. Tyrone Sage.  I ordered a C. Diff toxin to rule out infectious diarrhea.  I also feel the patient would benefit from evaluation by a Gastroenterologist in regards to is there a source for the constant vomiting and diarrhea.  He will follow up with Dr. Valentina Lucks on 12/18/2011

## 2011-12-16 ENCOUNTER — Other Ambulatory Visit: Payer: Self-pay | Admitting: Cardiothoracic Surgery

## 2011-12-18 ENCOUNTER — Other Ambulatory Visit: Payer: Self-pay | Admitting: Physician Assistant

## 2011-12-19 LAB — CLOSTRIDIUM DIFFICILE EIA: CDIFTX: NEGATIVE

## 2011-12-26 ENCOUNTER — Encounter: Payer: Self-pay | Admitting: Surgery

## 2011-12-29 ENCOUNTER — Encounter: Payer: Self-pay | Admitting: Surgery

## 2011-12-29 ENCOUNTER — Ambulatory Visit (INDEPENDENT_AMBULATORY_CARE_PROVIDER_SITE_OTHER): Payer: Medicare PPO | Admitting: Surgery

## 2011-12-29 VITALS — BP 129/55 | HR 62 | Temp 97.7°F | Ht 73.0 in | Wt 176.0 lb

## 2011-12-29 DIAGNOSIS — I6529 Occlusion and stenosis of unspecified carotid artery: Secondary | ICD-10-CM

## 2011-12-29 NOTE — Progress Notes (Signed)
The patient comes back today for a another postoperative check. He is status post right carotid endarterectomy performed in conjunction with coronary artery bypass grafting on 10/31/2011. His postoperative course was complicated by pulmonary issues requiring prolonged intubation following a period of extubation. He also had difficulty with aspiration and hoarseness. He was ultimately discharged home. While in the hospital he did have a carotid duplex which showed a widely patent endarterectomy site. When I saw him and July there was a small, approximately 2 mm opening in the base of his carotid incision. There was no evidence of infection. The patient is back today stating that his appetite has returned. He states that he has found a significant improvement once he discontinued the amiodarone. He denies any difficulty with his speech. He denies weakness or numbness.   on examination, his right carotid incision has fully healed. His tongue is midline cranial nerves II through XII were grossly intact. He has good strength bilaterally.  The patient has shown significant improvement since I last saw him. He is nearing being back to his baseline. I will plan on seeing him in January of 2014 with a repeat carotid ultrasound

## 2012-01-15 ENCOUNTER — Ambulatory Visit (INDEPENDENT_AMBULATORY_CARE_PROVIDER_SITE_OTHER): Payer: Medicare PPO | Admitting: Cardiothoracic Surgery

## 2012-01-15 ENCOUNTER — Encounter: Payer: Self-pay | Admitting: Cardiothoracic Surgery

## 2012-01-15 VITALS — BP 130/66 | HR 61 | Resp 18 | Ht 73.0 in | Wt 184.0 lb

## 2012-01-15 DIAGNOSIS — I2510 Atherosclerotic heart disease of native coronary artery without angina pectoris: Secondary | ICD-10-CM

## 2012-01-15 DIAGNOSIS — I779 Disorder of arteries and arterioles, unspecified: Secondary | ICD-10-CM

## 2012-01-15 DIAGNOSIS — Z09 Encounter for follow-up examination after completed treatment for conditions other than malignant neoplasm: Secondary | ICD-10-CM

## 2012-01-15 NOTE — Progress Notes (Signed)
                   [address]            [place] [address]          [phone]     HPI:  Patient returns for routine postoperative follow-up having undergone Right CEA and CABG x4 on 10/31/2011. The patient's early postoperative recovery while in the hospital was notable for respiratory distress, Atrial fibrillation, dysphagia requiring tube feeds. Since hospital discharge the patient reports he has not been doing very well. Po diet is much better.  He states he has lost 20lbs since the start of all his recent problems. Now gaining wt .    This med list is not definite, patient not sure what is is taking, I encouraged him to take pill bottles within on next md visit   Current Outpatient Prescriptions  Medication Sig Dispense Refill  . acetaminophen (TYLENOL) 325 MG tablet Take 650 mg by mouth every 6 (six) hours as needed.      Marland Kitchen allopurinol (ZYLOPRIM) 300 MG tablet Take 300 mg by mouth daily.      Marland Kitchen amiodarone (PACERONE) 400 MG tablet Take 1 tablet (400 mg total) by mouth 2 (two) times daily. X 1 week, then decrease to 200 mg po BID  75 tablet  1  . aspirin 325 MG EC tablet Take 325 mg by mouth daily.      Marland Kitchen buPROPion (WELLBUTRIN SR) 150 MG 12 hr tablet Take 1 tablet (150 mg total) by mouth 2 (two) times daily.  60 tablet  0  . colchicine 0.6 MG tablet Take 1 tablet (0.6 mg total) by mouth daily.  30 tablet  2  . metoprolol tartrate (LOPRESSOR) 25 MG tablet Take 12.5 mg by mouth 2 (two) times daily.      Marland Kitchen omeprazole (PRILOSEC) 20 MG capsule Take 20 mg by mouth daily. 1/2 before food      . simvastatin (ZOCOR) 10 MG tablet Take 1 tablet (10 mg total) by mouth at bedtime.  30 tablet  1    Physical Exam: BP 130/66  Pulse 61  Resp 18  Ht 6\' 1"  (1.854 m)  Wt 184 lb (83.462 kg)  BMI 24.28 kg/m2  SpO2 98%  Gen: no apparent distress Lungs: CTA bilaterally Heart: RRR, sternum stable Abd: soft non-tender non-distended Skin: wounds healing well Neuro: grossly  intact  Diagnostic Tests: RADIOLOGY REPORT*  Clinical Data: Status post CABG, follow-up  CHEST - 2 VIEW  Comparison: Chest x-ray of 12/03/2011  Findings: No active infiltrate or effusion is seen. Mild apical  pleural thickening appears stable. Mediastinal contours are  stable. The heart is mildly enlarged. Median sternotomy sutures  are noted from CABG. There are degenerative changes throughout the  thoracic spine.  IMPRESSION:  Stable chest x-ray. No active lung disease.  Original Report Authenticated By: Juline Patch, M.D.     Impression:  Mr. Amborn is S/P R CEA/CABG x4 from 10/31/2011.  From surgery standpoint Mr. Gelpi is doing well.  He denies chest pain and shortness of breath.   His nausea, vomiting, diarrhea, and constipation have improves after changing his meds but he is not sure what was stopped.   Plan: Today is his birthday Improving after cabg/cea, but slowly Have encouraged to start cardiac rehab Will see prn  Delight Ovens MD  Beeper [phone] Office [phone] 01/15/2012 2:22 PM

## 2012-01-20 ENCOUNTER — Other Ambulatory Visit (HOSPITAL_COMMUNITY): Payer: Self-pay | Admitting: Physician Assistant

## 2012-01-29 ENCOUNTER — Ambulatory Visit (HOSPITAL_COMMUNITY): Payer: Medicare PPO

## 2012-02-02 ENCOUNTER — Ambulatory Visit (HOSPITAL_COMMUNITY): Payer: Medicare PPO

## 2012-02-04 ENCOUNTER — Ambulatory Visit (HOSPITAL_COMMUNITY): Payer: Medicare PPO

## 2012-02-04 ENCOUNTER — Other Ambulatory Visit (HOSPITAL_COMMUNITY): Payer: Self-pay | Admitting: Physician Assistant

## 2012-02-05 ENCOUNTER — Ambulatory Visit (HOSPITAL_COMMUNITY): Payer: Medicare PPO

## 2012-02-06 ENCOUNTER — Ambulatory Visit (HOSPITAL_COMMUNITY): Payer: Medicare PPO

## 2012-02-09 ENCOUNTER — Ambulatory Visit (HOSPITAL_COMMUNITY): Payer: Medicare PPO

## 2012-02-11 ENCOUNTER — Other Ambulatory Visit (HOSPITAL_COMMUNITY): Payer: Self-pay | Admitting: Physician Assistant

## 2012-02-11 ENCOUNTER — Ambulatory Visit (HOSPITAL_COMMUNITY): Payer: Medicare PPO

## 2012-02-12 ENCOUNTER — Ambulatory Visit (HOSPITAL_COMMUNITY): Payer: Medicare PPO

## 2012-02-13 ENCOUNTER — Ambulatory Visit (HOSPITAL_COMMUNITY): Payer: Medicare PPO

## 2012-02-16 ENCOUNTER — Ambulatory Visit (HOSPITAL_COMMUNITY): Payer: Medicare PPO

## 2012-02-18 ENCOUNTER — Other Ambulatory Visit: Payer: Self-pay | Admitting: Cardiothoracic Surgery

## 2012-02-18 ENCOUNTER — Ambulatory Visit (HOSPITAL_COMMUNITY): Payer: Medicare PPO

## 2012-02-18 DIAGNOSIS — M109 Gout, unspecified: Secondary | ICD-10-CM

## 2012-02-20 ENCOUNTER — Ambulatory Visit (HOSPITAL_COMMUNITY): Payer: Medicare PPO

## 2012-02-23 ENCOUNTER — Other Ambulatory Visit: Payer: Self-pay | Admitting: *Deleted

## 2012-02-23 ENCOUNTER — Ambulatory Visit (HOSPITAL_COMMUNITY): Payer: Medicare PPO

## 2012-02-23 DIAGNOSIS — Z48812 Encounter for surgical aftercare following surgery on the circulatory system: Secondary | ICD-10-CM

## 2012-02-23 DIAGNOSIS — I6529 Occlusion and stenosis of unspecified carotid artery: Secondary | ICD-10-CM

## 2012-02-25 ENCOUNTER — Ambulatory Visit (HOSPITAL_COMMUNITY): Payer: Medicare PPO

## 2012-02-27 ENCOUNTER — Ambulatory Visit (HOSPITAL_COMMUNITY): Payer: Medicare PPO

## 2012-03-01 ENCOUNTER — Ambulatory Visit (HOSPITAL_COMMUNITY): Payer: Medicare PPO

## 2012-03-03 ENCOUNTER — Ambulatory Visit (HOSPITAL_COMMUNITY): Payer: Medicare PPO

## 2012-03-04 ENCOUNTER — Encounter (HOSPITAL_COMMUNITY)
Admission: RE | Admit: 2012-03-04 | Discharge: 2012-03-04 | Disposition: A | Discharge status: Home / Self Care | Payer: Medicare PPO | Source: Ambulatory Visit | Attending: Interventional Cardiology | Admitting: Interventional Cardiology

## 2012-03-04 DIAGNOSIS — Z8673 Personal history of transient ischemic attack (TIA), and cerebral infarction without residual deficits: Secondary | ICD-10-CM | POA: Insufficient documentation

## 2012-03-04 DIAGNOSIS — E785 Hyperlipidemia, unspecified: Secondary | ICD-10-CM | POA: Insufficient documentation

## 2012-03-04 DIAGNOSIS — J4489 Other specified chronic obstructive pulmonary disease: Secondary | ICD-10-CM | POA: Insufficient documentation

## 2012-03-04 DIAGNOSIS — K219 Gastro-esophageal reflux disease without esophagitis: Secondary | ICD-10-CM | POA: Insufficient documentation

## 2012-03-04 DIAGNOSIS — I4892 Unspecified atrial flutter: Secondary | ICD-10-CM | POA: Insufficient documentation

## 2012-03-04 DIAGNOSIS — I6529 Occlusion and stenosis of unspecified carotid artery: Secondary | ICD-10-CM | POA: Insufficient documentation

## 2012-03-04 DIAGNOSIS — Z951 Presence of aortocoronary bypass graft: Secondary | ICD-10-CM | POA: Insufficient documentation

## 2012-03-04 DIAGNOSIS — I252 Old myocardial infarction: Secondary | ICD-10-CM | POA: Insufficient documentation

## 2012-03-04 DIAGNOSIS — Z5189 Encounter for other specified aftercare: Secondary | ICD-10-CM | POA: Insufficient documentation

## 2012-03-04 DIAGNOSIS — E7800 Pure hypercholesterolemia, unspecified: Secondary | ICD-10-CM | POA: Insufficient documentation

## 2012-03-04 DIAGNOSIS — I129 Hypertensive chronic kidney disease with stage 1 through stage 4 chronic kidney disease, or unspecified chronic kidney disease: Secondary | ICD-10-CM | POA: Insufficient documentation

## 2012-03-04 DIAGNOSIS — I2510 Atherosclerotic heart disease of native coronary artery without angina pectoris: Secondary | ICD-10-CM | POA: Insufficient documentation

## 2012-03-04 DIAGNOSIS — N183 Chronic kidney disease, stage 3 unspecified: Secondary | ICD-10-CM | POA: Insufficient documentation

## 2012-03-04 DIAGNOSIS — J449 Chronic obstructive pulmonary disease, unspecified: Secondary | ICD-10-CM | POA: Insufficient documentation

## 2012-03-04 DIAGNOSIS — F17200 Nicotine dependence, unspecified, uncomplicated: Secondary | ICD-10-CM | POA: Insufficient documentation

## 2012-03-04 DIAGNOSIS — I4891 Unspecified atrial fibrillation: Secondary | ICD-10-CM | POA: Insufficient documentation

## 2012-03-04 DIAGNOSIS — R5381 Other malaise: Secondary | ICD-10-CM | POA: Insufficient documentation

## 2012-03-04 NOTE — Progress Notes (Signed)
Cardiac Rehab Medication Review by a Pharmacist  Does the patient  feel that his/her medications are working for him/her?  yes  Has the patient been experiencing any side effects to the medications prescribed?  Yes.  Amiodarone was making him unable to eat and food didn't taste good.  Reports weight loss of 32 lbs (June to September).  Dr. Verdis Prime discontinued this medication in September.   Does the patient measure his/her own blood pressure or blood glucose at home?  no   Does the patient have any problems obtaining medications due to transportation or finances?   no  Understanding of regimen: good Understanding of indications: good Potential of compliance: excellent.  Wife helps with medications.    Pharmacist comments: Patient has no questions regarding medications.  States he has been on these medications a long time.  Lillia Pauls, PharmD Clinical Pharmacist Pager: [phone] Phone: [phone] 03/04/2012 9:09 AM

## 2012-03-05 ENCOUNTER — Ambulatory Visit (HOSPITAL_COMMUNITY): Payer: Medicare PPO

## 2012-03-08 ENCOUNTER — Ambulatory Visit (HOSPITAL_COMMUNITY): Payer: Medicare PPO

## 2012-03-08 ENCOUNTER — Encounter (HOSPITAL_COMMUNITY): Payer: Self-pay

## 2012-03-08 ENCOUNTER — Encounter (HOSPITAL_COMMUNITY)
Admission: RE | Admit: 2012-03-08 | Discharge: 2012-03-08 | Disposition: A | Discharge status: Home / Self Care | Payer: Medicare PPO | Source: Ambulatory Visit | Attending: Interventional Cardiology | Admitting: Interventional Cardiology

## 2012-03-08 NOTE — Progress Notes (Addendum)
Pt started cardiac rehab today.  Pt tolerated light exercise without difficulty.  VSS, telemetry-normal sinus rhythm, occasional PVC.    Pt c/o claudication while walking track.  Will continue to monitor.  Pt unable to recall current home medications.  Pt advised to bring list to rehab next visit for med reconciliation.  Pt oriented to exercise equipment and routine.  Understanding verbalized.

## 2012-03-10 ENCOUNTER — Ambulatory Visit (HOSPITAL_COMMUNITY): Payer: Medicare PPO

## 2012-03-10 ENCOUNTER — Encounter (HOSPITAL_COMMUNITY): Payer: Medicare PPO

## 2012-03-12 ENCOUNTER — Ambulatory Visit (HOSPITAL_COMMUNITY): Payer: Medicare PPO

## 2012-03-12 ENCOUNTER — Encounter (HOSPITAL_COMMUNITY)
Admission: RE | Admit: 2012-03-12 | Discharge: 2012-03-12 | Disposition: A | Discharge status: Home / Self Care | Payer: Medicare PPO | Source: Ambulatory Visit | Attending: Interventional Cardiology | Admitting: Interventional Cardiology

## 2012-03-12 DIAGNOSIS — I4892 Unspecified atrial flutter: Secondary | ICD-10-CM | POA: Insufficient documentation

## 2012-03-12 DIAGNOSIS — N183 Chronic kidney disease, stage 3 unspecified: Secondary | ICD-10-CM | POA: Insufficient documentation

## 2012-03-12 DIAGNOSIS — J4489 Other specified chronic obstructive pulmonary disease: Secondary | ICD-10-CM | POA: Insufficient documentation

## 2012-03-12 DIAGNOSIS — I129 Hypertensive chronic kidney disease with stage 1 through stage 4 chronic kidney disease, or unspecified chronic kidney disease: Secondary | ICD-10-CM | POA: Insufficient documentation

## 2012-03-12 DIAGNOSIS — E7800 Pure hypercholesterolemia, unspecified: Secondary | ICD-10-CM | POA: Insufficient documentation

## 2012-03-12 DIAGNOSIS — Z5189 Encounter for other specified aftercare: Secondary | ICD-10-CM | POA: Insufficient documentation

## 2012-03-12 DIAGNOSIS — R5381 Other malaise: Secondary | ICD-10-CM | POA: Insufficient documentation

## 2012-03-12 DIAGNOSIS — I6529 Occlusion and stenosis of unspecified carotid artery: Secondary | ICD-10-CM | POA: Insufficient documentation

## 2012-03-12 DIAGNOSIS — I4891 Unspecified atrial fibrillation: Secondary | ICD-10-CM | POA: Insufficient documentation

## 2012-03-12 DIAGNOSIS — E785 Hyperlipidemia, unspecified: Secondary | ICD-10-CM | POA: Insufficient documentation

## 2012-03-12 DIAGNOSIS — Z951 Presence of aortocoronary bypass graft: Secondary | ICD-10-CM | POA: Insufficient documentation

## 2012-03-12 DIAGNOSIS — I252 Old myocardial infarction: Secondary | ICD-10-CM | POA: Insufficient documentation

## 2012-03-12 DIAGNOSIS — J449 Chronic obstructive pulmonary disease, unspecified: Secondary | ICD-10-CM | POA: Insufficient documentation

## 2012-03-12 DIAGNOSIS — I2510 Atherosclerotic heart disease of native coronary artery without angina pectoris: Secondary | ICD-10-CM | POA: Insufficient documentation

## 2012-03-12 DIAGNOSIS — Z8673 Personal history of transient ischemic attack (TIA), and cerebral infarction without residual deficits: Secondary | ICD-10-CM | POA: Insufficient documentation

## 2012-03-12 DIAGNOSIS — F17200 Nicotine dependence, unspecified, uncomplicated: Secondary | ICD-10-CM | POA: Insufficient documentation

## 2012-03-12 DIAGNOSIS — K219 Gastro-esophageal reflux disease without esophagitis: Secondary | ICD-10-CM | POA: Insufficient documentation

## 2012-03-15 ENCOUNTER — Ambulatory Visit (HOSPITAL_COMMUNITY): Payer: Medicare PPO

## 2012-03-15 ENCOUNTER — Encounter (HOSPITAL_COMMUNITY): Payer: Medicare PPO

## 2012-03-17 ENCOUNTER — Encounter (HOSPITAL_COMMUNITY)
Admission: RE | Admit: 2012-03-17 | Discharge: 2012-03-17 | Disposition: A | Discharge status: Home / Self Care | Payer: Medicare PPO | Source: Ambulatory Visit | Attending: Interventional Cardiology | Admitting: Interventional Cardiology

## 2012-03-17 ENCOUNTER — Ambulatory Visit (HOSPITAL_COMMUNITY): Payer: Medicare PPO

## 2012-03-19 ENCOUNTER — Ambulatory Visit (HOSPITAL_COMMUNITY): Payer: Medicare PPO

## 2012-03-19 ENCOUNTER — Encounter (HOSPITAL_COMMUNITY)
Admission: RE | Admit: 2012-03-19 | Discharge: 2012-03-19 | Disposition: A | Discharge status: Home / Self Care | Payer: Medicare PPO | Source: Ambulatory Visit | Attending: Interventional Cardiology | Admitting: Interventional Cardiology

## 2012-03-22 ENCOUNTER — Encounter (HOSPITAL_COMMUNITY): Payer: Medicare PPO

## 2012-03-22 ENCOUNTER — Ambulatory Visit (HOSPITAL_COMMUNITY): Payer: Medicare PPO

## 2012-03-24 ENCOUNTER — Ambulatory Visit (HOSPITAL_COMMUNITY): Payer: Medicare PPO

## 2012-03-24 ENCOUNTER — Encounter (HOSPITAL_COMMUNITY): Payer: Medicare PPO

## 2012-03-26 ENCOUNTER — Encounter (HOSPITAL_COMMUNITY): Payer: Medicare PPO

## 2012-03-26 ENCOUNTER — Ambulatory Visit (HOSPITAL_COMMUNITY): Payer: Medicare PPO

## 2012-03-29 ENCOUNTER — Ambulatory Visit (HOSPITAL_COMMUNITY): Payer: Medicare PPO

## 2012-03-29 ENCOUNTER — Encounter (HOSPITAL_COMMUNITY): Payer: Medicare PPO

## 2012-03-31 ENCOUNTER — Ambulatory Visit (HOSPITAL_COMMUNITY): Payer: Medicare PPO

## 2012-03-31 ENCOUNTER — Encounter (HOSPITAL_COMMUNITY): Payer: Medicare PPO

## 2012-04-02 ENCOUNTER — Encounter (HOSPITAL_COMMUNITY): Payer: Medicare PPO

## 2012-04-02 ENCOUNTER — Ambulatory Visit (HOSPITAL_COMMUNITY): Payer: Medicare PPO

## 2012-04-03 ENCOUNTER — Encounter (HOSPITAL_COMMUNITY)
Admission: EM | Disposition: A | Discharge status: Home Health | Payer: Self-pay | Source: Home / Self Care | Attending: Interventional Cardiology

## 2012-04-03 ENCOUNTER — Ambulatory Visit (HOSPITAL_COMMUNITY): Admit: 2012-04-03 | Payer: Self-pay | Admitting: Cardiovascular Disease

## 2012-04-03 ENCOUNTER — Encounter (HOSPITAL_COMMUNITY): Payer: Self-pay | Admitting: Emergency Medicine

## 2012-04-03 ENCOUNTER — Emergency Department (HOSPITAL_COMMUNITY): Payer: Medicare PPO

## 2012-04-03 ENCOUNTER — Inpatient Hospital Stay (HOSPITAL_COMMUNITY)
Admission: EM | Admit: 2012-04-03 | Discharge: 2012-04-08 | DRG: 280 | Disposition: A | Discharge status: Home Health | Payer: Medicare PPO | Attending: Interventional Cardiology | Admitting: Interventional Cardiology

## 2012-04-03 DIAGNOSIS — Z79899 Other long term (current) drug therapy: Secondary | ICD-10-CM

## 2012-04-03 DIAGNOSIS — E785 Hyperlipidemia, unspecified: Secondary | ICD-10-CM | POA: Diagnosis present

## 2012-04-03 DIAGNOSIS — I5033 Acute on chronic diastolic (congestive) heart failure: Secondary | ICD-10-CM

## 2012-04-03 DIAGNOSIS — J9600 Acute respiratory failure, unspecified whether with hypoxia or hypercapnia: Secondary | ICD-10-CM

## 2012-04-03 DIAGNOSIS — I25810 Atherosclerosis of coronary artery bypass graft(s) without angina pectoris: Secondary | ICD-10-CM | POA: Diagnosis present

## 2012-04-03 DIAGNOSIS — I2582 Chronic total occlusion of coronary artery: Secondary | ICD-10-CM | POA: Diagnosis present

## 2012-04-03 DIAGNOSIS — G9349 Other encephalopathy: Secondary | ICD-10-CM | POA: Diagnosis not present

## 2012-04-03 DIAGNOSIS — F329 Major depressive disorder, single episode, unspecified: Secondary | ICD-10-CM

## 2012-04-03 DIAGNOSIS — I214 Non-ST elevation (NSTEMI) myocardial infarction: Principal | ICD-10-CM

## 2012-04-03 DIAGNOSIS — F17200 Nicotine dependence, unspecified, uncomplicated: Secondary | ICD-10-CM

## 2012-04-03 DIAGNOSIS — Z7982 Long term (current) use of aspirin: Secondary | ICD-10-CM

## 2012-04-03 DIAGNOSIS — J4489 Other specified chronic obstructive pulmonary disease: Secondary | ICD-10-CM | POA: Diagnosis present

## 2012-04-03 DIAGNOSIS — Z8673 Personal history of transient ischemic attack (TIA), and cerebral infarction without residual deficits: Secondary | ICD-10-CM

## 2012-04-03 DIAGNOSIS — I129 Hypertensive chronic kidney disease with stage 1 through stage 4 chronic kidney disease, or unspecified chronic kidney disease: Secondary | ICD-10-CM | POA: Diagnosis present

## 2012-04-03 DIAGNOSIS — I10 Essential (primary) hypertension: Secondary | ICD-10-CM

## 2012-04-03 DIAGNOSIS — R4182 Altered mental status, unspecified: Secondary | ICD-10-CM

## 2012-04-03 DIAGNOSIS — Z981 Arthrodesis status: Secondary | ICD-10-CM

## 2012-04-03 DIAGNOSIS — N184 Chronic kidney disease, stage 4 (severe): Secondary | ICD-10-CM | POA: Diagnosis present

## 2012-04-03 DIAGNOSIS — R0602 Shortness of breath: Secondary | ICD-10-CM

## 2012-04-03 DIAGNOSIS — Z951 Presence of aortocoronary bypass graft: Secondary | ICD-10-CM

## 2012-04-03 DIAGNOSIS — I2510 Atherosclerotic heart disease of native coronary artery without angina pectoris: Secondary | ICD-10-CM

## 2012-04-03 DIAGNOSIS — J449 Chronic obstructive pulmonary disease, unspecified: Secondary | ICD-10-CM | POA: Diagnosis present

## 2012-04-03 DIAGNOSIS — N179 Acute kidney failure, unspecified: Secondary | ICD-10-CM

## 2012-04-03 DIAGNOSIS — I219 Acute myocardial infarction, unspecified: Secondary | ICD-10-CM

## 2012-04-03 DIAGNOSIS — I509 Heart failure, unspecified: Secondary | ICD-10-CM | POA: Diagnosis present

## 2012-04-03 DIAGNOSIS — J811 Chronic pulmonary edema: Secondary | ICD-10-CM | POA: Diagnosis present

## 2012-04-03 DIAGNOSIS — K219 Gastro-esophageal reflux disease without esophagitis: Secondary | ICD-10-CM | POA: Diagnosis present

## 2012-04-03 DIAGNOSIS — J984 Other disorders of lung: Secondary | ICD-10-CM

## 2012-04-03 DIAGNOSIS — R0902 Hypoxemia: Secondary | ICD-10-CM

## 2012-04-03 DIAGNOSIS — E876 Hypokalemia: Secondary | ICD-10-CM | POA: Diagnosis not present

## 2012-04-03 DIAGNOSIS — J9620 Acute and chronic respiratory failure, unspecified whether with hypoxia or hypercapnia: Secondary | ICD-10-CM

## 2012-04-03 DIAGNOSIS — F32A Depression, unspecified: Secondary | ICD-10-CM

## 2012-04-03 DIAGNOSIS — I739 Peripheral vascular disease, unspecified: Secondary | ICD-10-CM | POA: Diagnosis present

## 2012-04-03 HISTORY — PX: LEFT HEART CATH: SHX5478

## 2012-04-03 LAB — HEPATIC FUNCTION PANEL
ALT: 16 U/L (ref 0–53)
AST: 20 U/L (ref 0–37)
Albumin: 3.7 g/dL (ref 3.5–5.2)

## 2012-04-03 LAB — TROPONIN I
Troponin I: 0.53 ng/mL (ref <0.30)
Troponin I: 7.57 ng/mL (ref <0.30)

## 2012-04-03 LAB — COMPREHENSIVE METABOLIC PANEL
AST: 20 U/L (ref 0–37)
CO2: 23 mEq/L (ref 19–32)
Calcium: 9.5 mg/dL (ref 8.4–10.5)
Creatinine, Ser: 1.57 mg/dL — ABNORMAL HIGH (ref 0.50–1.35)
GFR calc Af Amer: 49 mL/min — ABNORMAL LOW (ref >90)
GFR calc non Af Amer: 42 mL/min — ABNORMAL LOW (ref >90)
Total Protein: 6.6 g/dL (ref 6.0–8.3)

## 2012-04-03 LAB — CBC WITH DIFFERENTIAL/PLATELET
Eosinophils Absolute: 1 10*3/uL — ABNORMAL HIGH (ref 0.0–0.7)
Eosinophils Relative: 8 % — ABNORMAL HIGH (ref 0–5)
Hemoglobin: 13.3 g/dL (ref 13.0–17.0)
Lymphs Abs: 1.8 10*3/uL (ref 0.7–4.0)
MCH: 28.2 pg (ref 26.0–34.0)
MCV: 87.5 fL (ref 78.0–100.0)
Monocytes Absolute: 0.8 10*3/uL (ref 0.1–1.0)
Monocytes Relative: 6 % (ref 3–12)
RBC: 4.71 MIL/uL (ref 4.22–5.81)

## 2012-04-03 LAB — POCT I-STAT, CHEM 8
HCT: 41 % (ref 39.0–52.0)
Hemoglobin: 13.9 g/dL (ref 13.0–17.0)
Potassium: 4.3 mEq/L (ref 3.5–5.1)
Sodium: 140 mEq/L (ref 135–145)

## 2012-04-03 LAB — TSH: TSH: 1.338 u[IU]/mL (ref 0.350–4.500)

## 2012-04-03 LAB — HEMOGLOBIN A1C
Hgb A1c MFr Bld: 5.4 % (ref <5.7)
Mean Plasma Glucose: 108 mg/dL (ref <117)

## 2012-04-03 LAB — PROTIME-INR
INR: 0.99 (ref 0.00–1.49)
Prothrombin Time: 13 seconds (ref 11.6–15.2)

## 2012-04-03 LAB — POCT I-STAT TROPONIN I

## 2012-04-03 LAB — APTT: aPTT: 32 seconds (ref 24–37)

## 2012-04-03 LAB — D-DIMER, QUANTITATIVE: D-Dimer, Quant: 0.7 ug/mL-FEU — ABNORMAL HIGH (ref 0.00–0.48)

## 2012-04-03 SURGERY — LEFT HEART CATH
Anesthesia: LOCAL

## 2012-04-03 MED ORDER — HEPARIN (PORCINE) IN NACL 100-0.45 UNIT/ML-% IJ SOLN
1000.0000 [IU]/h | INTRAMUSCULAR | Status: DC
  Administered 2012-04-03: 1000 [IU]/h via INTRAVENOUS
  Filled 2012-04-03 (×2): qty 250

## 2012-04-03 MED ORDER — SODIUM CHLORIDE 0.9 % IV SOLN
INTRAVENOUS | Status: DC
  Administered 2012-04-04: 10 mL via INTRAVENOUS

## 2012-04-03 MED ORDER — FENTANYL CITRATE 0.05 MG/ML IJ SOLN
INTRAMUSCULAR | Status: AC
  Filled 2012-04-03: qty 2

## 2012-04-03 MED ORDER — MIDAZOLAM HCL 2 MG/2ML IJ SOLN
INTRAMUSCULAR | Status: AC
  Filled 2012-04-03: qty 2

## 2012-04-03 MED ORDER — ASPIRIN EC 81 MG PO TBEC: 81.0000 mg | DELAYED_RELEASE_TABLET | Freq: Every day | ORAL | Status: DC

## 2012-04-03 MED ORDER — ASPIRIN EC 81 MG PO TBEC
81.0000 mg | DELAYED_RELEASE_TABLET | Freq: Every day | ORAL | Status: DC
  Administered 2012-04-04 – 2012-04-07 (×4): 81 mg via ORAL
  Filled 2012-04-03 (×5): qty 1

## 2012-04-03 MED ORDER — ACETAMINOPHEN 325 MG PO TABS: 650.0000 mg | ORAL_TABLET | ORAL | Status: DC | PRN

## 2012-04-03 MED ORDER — CLOPIDOGREL BISULFATE 75 MG PO TABS
75.0000 mg | ORAL_TABLET | Freq: Every day | ORAL | Status: DC
  Administered 2012-04-04 – 2012-04-08 (×5): 75 mg via ORAL
  Filled 2012-04-03 (×7): qty 1

## 2012-04-03 MED ORDER — ONDANSETRON HCL 4 MG/2ML IJ SOLN: 4.0000 mg | Freq: Four times a day (QID) | INTRAMUSCULAR | Status: DC | PRN

## 2012-04-03 MED ORDER — FUROSEMIDE 10 MG/ML IJ SOLN
INTRAMUSCULAR | Status: AC
  Administered 2012-04-03: 40 mg
  Filled 2012-04-03: qty 4

## 2012-04-03 MED ORDER — FUROSEMIDE 10 MG/ML IJ SOLN
40.0000 mg | Freq: Once | INTRAMUSCULAR | Status: AC
Start: 2012-04-03 — End: 2012-04-03
  Administered 2012-04-03: 40 mg via INTRAVENOUS

## 2012-04-03 MED ORDER — MORPHINE SULFATE 4 MG/ML IJ SOLN
INTRAMUSCULAR | Status: AC
  Filled 2012-04-03: qty 1

## 2012-04-03 MED ORDER — MORPHINE SULFATE 2 MG/ML IJ SOLN
INTRAMUSCULAR | Status: AC
  Administered 2012-04-03: 2 mg via INTRAVENOUS
  Filled 2012-04-03: qty 1

## 2012-04-03 MED ORDER — HEPARIN (PORCINE) IN NACL 2-0.9 UNIT/ML-% IJ SOLN
INTRAMUSCULAR | Status: AC
  Filled 2012-04-03: qty 2000

## 2012-04-03 MED ORDER — ALLOPURINOL 300 MG PO TABS
300.0000 mg | ORAL_TABLET | Freq: Every day | ORAL | Status: DC
  Administered 2012-04-03 – 2012-04-07 (×5): 300 mg via ORAL
  Filled 2012-04-03 (×6): qty 1

## 2012-04-03 MED ORDER — ASPIRIN 81 MG PO CHEW
324.0000 mg | CHEWABLE_TABLET | Freq: Once | ORAL | Status: AC
  Administered 2012-04-03: 324 mg via ORAL
  Filled 2012-04-03 (×2): qty 4

## 2012-04-03 MED ORDER — NITROGLYCERIN IN D5W 200-5 MCG/ML-% IV SOLN
10.0000 ug/min | INTRAVENOUS | Status: DC
  Administered 2012-04-03: 30 ug/min via INTRAVENOUS
  Administered 2012-04-04: 100 ug/min via INTRAVENOUS
  Filled 2012-04-03 (×3): qty 250

## 2012-04-03 MED ORDER — HYDROMORPHONE HCL PF 1 MG/ML IJ SOLN
1.0000 mg | Freq: Once | INTRAMUSCULAR | Status: AC
  Administered 2012-04-03: 1 mg via INTRAVENOUS
  Filled 2012-04-03: qty 1

## 2012-04-03 MED ORDER — NITROGLYCERIN IN D5W 200-5 MCG/ML-% IV SOLN: 2.0000 ug/min | INTRAVENOUS | Status: DC

## 2012-04-03 MED ORDER — MORPHINE SULFATE 4 MG/ML IJ SOLN
4.0000 mg | Freq: Once | INTRAMUSCULAR | Status: AC
  Administered 2012-04-03: 06:00:00 via INTRAVENOUS

## 2012-04-03 MED ORDER — BUPROPION HCL ER (SR) 150 MG PO TB12: 150.0000 mg | ORAL_TABLET | Freq: Two times a day (BID) | ORAL | Status: DC

## 2012-04-03 MED ORDER — METOPROLOL TARTRATE 25 MG PO TABS
25.0000 mg | ORAL_TABLET | Freq: Four times a day (QID) | ORAL | Status: DC
  Administered 2012-04-03 – 2012-04-07 (×16): 25 mg via ORAL
  Filled 2012-04-03 (×22): qty 1

## 2012-04-03 MED ORDER — SIMVASTATIN 40 MG PO TABS
40.0000 mg | ORAL_TABLET | Freq: Every day | ORAL | Status: DC
  Administered 2012-04-03 – 2012-04-07 (×5): 40 mg via ORAL
  Filled 2012-04-03 (×6): qty 1

## 2012-04-03 MED ORDER — NITROGLYCERIN IN D5W 200-5 MCG/ML-% IV SOLN
2.0000 ug/min | INTRAVENOUS | Status: DC
  Administered 2012-04-03 (×2): 100 ug/min via INTRAVENOUS
  Filled 2012-04-03: qty 250

## 2012-04-03 MED ORDER — LIDOCAINE HCL (PF) 1 % IJ SOLN
INTRAMUSCULAR | Status: AC
  Filled 2012-04-03: qty 30

## 2012-04-03 MED ORDER — OXYCODONE-ACETAMINOPHEN 5-325 MG PO TABS
1.0000 | ORAL_TABLET | Freq: Four times a day (QID) | ORAL | Status: DC | PRN
  Administered 2012-04-03 – 2012-04-08 (×17): 1 via ORAL
  Filled 2012-04-03 (×17): qty 1

## 2012-04-03 MED ORDER — PANTOPRAZOLE SODIUM 40 MG PO TBEC
40.0000 mg | DELAYED_RELEASE_TABLET | Freq: Every day | ORAL | Status: DC
  Administered 2012-04-03 – 2012-04-07 (×5): 40 mg via ORAL
  Filled 2012-04-03 (×5): qty 1

## 2012-04-03 MED ORDER — NITROGLYCERIN 0.2 MG/ML ON CALL CATH LAB
INTRAVENOUS | Status: AC
  Filled 2012-04-03: qty 1

## 2012-04-03 MED ORDER — HEPARIN BOLUS VIA INFUSION
4000.0000 [IU] | Freq: Once | INTRAVENOUS | Status: AC
  Administered 2012-04-03: 4000 [IU] via INTRAVENOUS

## 2012-04-03 MED ORDER — BUPROPION HCL ER (XL) 150 MG PO TB24
150.0000 mg | ORAL_TABLET | Freq: Every day | ORAL | Status: DC
  Administered 2012-04-03 – 2012-04-07 (×5): 150 mg via ORAL
  Filled 2012-04-03 (×6): qty 1

## 2012-04-03 MED ORDER — FUROSEMIDE 10 MG/ML IJ SOLN
40.0000 mg | Freq: Every day | INTRAMUSCULAR | Status: DC
  Administered 2012-04-04 – 2012-04-05 (×2): 40 mg via INTRAVENOUS
  Filled 2012-04-03 (×2): qty 4

## 2012-04-03 MED ORDER — MORPHINE SULFATE 2 MG/ML IJ SOLN
INTRAMUSCULAR | Status: AC
  Administered 2012-04-03: 1 mg via INTRAVASCULAR
  Filled 2012-04-03: qty 1

## 2012-04-03 MED ORDER — METOPROLOL TARTRATE 25 MG PO TABS
25.0000 mg | ORAL_TABLET | Freq: Two times a day (BID) | ORAL | Status: DC
  Filled 2012-04-03: qty 1

## 2012-04-03 MED ORDER — NITROGLYCERIN 0.4 MG SL SUBL: 0.4000 mg | SUBLINGUAL_TABLET | SUBLINGUAL | Status: DC | PRN

## 2012-04-03 MED ORDER — POTASSIUM CHLORIDE IN NACL 20-0.9 MEQ/L-% IV SOLN
INTRAVENOUS | Status: DC
  Administered 2012-04-03 (×2): via INTRAVENOUS
  Administered 2012-04-04: 10 mL via INTRAVENOUS
  Filled 2012-04-03 (×5): qty 1000

## 2012-04-03 MED ORDER — FUROSEMIDE 10 MG/ML IJ SOLN
40.0000 mg | Freq: Once | INTRAMUSCULAR | Status: AC
  Administered 2012-04-03: 40 mg via INTRAVENOUS
  Filled 2012-04-03: qty 4

## 2012-04-03 NOTE — ED Notes (Signed)
Pt reports having congested cough all the time, "not new", "CP is new".

## 2012-04-03 NOTE — ED Notes (Signed)
Called [phone] to give report, staff stated no one available to take report at this time.

## 2012-04-03 NOTE — Progress Notes (Signed)
ANTICOAGULATION CONSULT NOTE - Initial Consult  Pharmacy Consult for heparin  Indication: chest pain/ACS  No Known Allergies  Patient Measurements: Height: 6' 1.62" (187 cm) Weight: 191 lb 5.8 oz (86.8 kg) IBW/kg (Calculated) : 81.33  Heparin Dosing Weight:    Vital Signs: Temp: 97.8 F (36.6 C) (11/23 0503) Temp src: Oral (11/23 0503) BP: 127/64 mmHg (11/23 0630) Pulse Rate: 67  (11/23 0630)  Labs:  Basename 04/03/12 0543 04/03/12 0527 04/03/12 0512  HGB 13.9 -- 13.3  HCT 41.0 -- 41.2  PLT -- -- 155  APTT -- 32 --  LABPROT -- 13.0 --  INR -- 0.99 --  HEPARINUNFRC -- -- --  CREATININE 1.60* -- --  CKTOTAL -- -- --  CKMB -- -- --  TROPONINI -- -- --    Estimated Creatinine Clearance: 47.3 ml/min (by C-G formula based on Cr of 1.6).   Medical History: Past Medical History  Diagnosis Date  . Aorto-iliac disease     bilat with stent 2007  . CAD (coronary artery disease)     with loop to the circumflex 2000, inferolateral infarction  . Hypertension   . Paroxysmal atrial flutter     ablation  . ED (erectile dysfunction)   . COPD (chronic obstructive pulmonary disease)   . Hyperlipidemia   . Stroke   . Shortness of breath   . GERD (gastroesophageal reflux disease)   . Gout     Medications:   (Not in a hospital admission)  Assessment: Pt woke at 0245 with cp and sob. Heparin for acs started with 4000/1000 in ED.  Goal of Therapy:  Heparin level 0.3-0.7 units/ml Monitor platelets by anticoagulation protocol: Yes   Plan:  Continue at 1000/hr and check HL in  8 hours.   Janice Coffin 04/03/2012,6:50 AM

## 2012-04-03 NOTE — CV Procedure (Signed)
Emergent Cardiac Cathereterization  Nathan [name], 71 y.o., male  Full note dictated; see diagram in chart.  DICTATION # [id number], [id number]  Ao: 135/70 LV: 135/13/26  LM: nl LAD: 505 in region of DX1 takeoff with brisk filling of DX vessel LCX: occluded proximally RCA: diffuse prox and mid 95% stenosis  SVG to RCA: patent SVG to OM: occluded at origin SVG to DX1: occluded at origin LIMA to LAD: patent  EF 50 - 55% with mild mid inferior and inferolateral hypocontractility. Tolerated well.  REC: Patient is currently completely pain free; medical therapy.  Lennette Bihari, MD, [facility] 04/03/2012 11:04 AM

## 2012-04-03 NOTE — Progress Notes (Addendum)
Patient ID: GAIUS ISHAQ, male   DOB: 08-14-1940, 71 y.o.   MRN: [id number]   Patient seen urgently at bedside for chest pain.  He was admitted with morning with chest pain/NSTEMI.  He is short of breath with decreased oxygen saturation (history of COPD but not on home oxygen).  He is on NTG 70 mcg/min with ongoing pain.  CXR was clear.   He had CABG and CEA in 6/13, had been stable since.  He has a 3/6 systolic murmur on exam.  ECG with diffuse anterior ST depression. - Will get urgent echo to assess murmur => he has had no prior valvular pathology that we know of.  ? Possibility of acute MR.  - Given ongoing chest pain and elevated cardiac enzymes, will need cardiac cath => discussed with Dr. Tresa Endo => but will get echo 1st.  - Increase NTG gtt and give Lasix 40 mg IV. - Place on NRBM.  - Dr. Mayford Knife notified.   Marca Ancona 04/03/2012 9:05 AM  I reviewed echo as it was done: probably moderate MR, no AS, no significant TR.  EF around 55% with anterolateral/posterior hypokinesis.   Marca Ancona 04/03/2012 9:30 AM   35 minutes critical care time.

## 2012-04-03 NOTE — Progress Notes (Signed)
  Echocardiogram 2D Echocardiogram has been performed.  Nathan [name] 04/03/2012, 10:19 AM

## 2012-04-03 NOTE — ED Notes (Signed)
Pt awoke at 0245 this am having chest pain and SOB. Chest pain rated as 7/10 on pain scale. Pt took 2 tylenol at home has not had any relief. Pain does not radiate, reports no other symptoms.Marland Kitchen

## 2012-04-03 NOTE — H&P (Signed)
Nathan [name] is an 71 y.o. male.    Chief Complaint: Chest Pain  HPI: 71 y/o male with a history of CAD and tobacco abuse presenting for chest pain evaluation.  He underwent CABG x 4 on 10/31/2011 with LIMA-->LAD, SVG-->diagonal,  SVG-->OM1, and SVG-->PDA.  Patient has been doing fine up until 02:45 am today when he work up from sleep with 8/10 chest pain that he described as tightness, non-radiating but associated with shortness of breath and nausea. He denies vomiting, diaphoresis, PND, orthopnea, palpitation or syncope. In the ER, his ECG showed sinus rhythm (75 bpm) with 1 to 2 mm ST-depression V2-V5.  His first set of cardiac marker showed a troponin-I (poc) of 0.1.  Currently, his chest pain has decreased to 2/10 with nitro drip. He continues to smoke 1 pack/day of cigarettes.   Past Medical History  Diagnosis Date  . Aorto-iliac disease     bilat with stent 2007  . CAD (coronary artery disease)     with loop to the circumflex 2000, inferolateral infarction  . Hypertension   . Paroxysmal atrial flutter     ablation  . ED (erectile dysfunction)   . COPD (chronic obstructive pulmonary disease)   . Hyperlipidemia   . Stroke   . Shortness of breath   . GERD (gastroesophageal reflux disease)   . Gout     Past Surgical History  Procedure Date  . Spinal fusion   . Hand surgery   . Neck fusion 1975  . Eye surgery   . Coronary artery bypass graft 10/31/2011    Procedure: CORONARY ARTERY BYPASS GRAFTING (CABG);  Surgeon: Delight Ovens, MD;  Location: [facility] OR;  Service: Open Heart Surgery;  Laterality: N/A;  times three using  Left Internal Mammary Artery and Right Greater Saphenous Vein Graft harvested endoscopically; TEE  . Endarterectomy 10/31/2011    Procedure: ENDARTERECTOMY CAROTID;  Surgeon: Nada Libman, MD;  Location: [facility] OR;  Service: Vascular;  Laterality: Right;  with patch angioplasty   . Carotid endarterectomy 10-31-2011    right    Family History  Problem  Relation Age of Onset  . Cancer Mother    Social History:  reports that he has been smoking Cigarettes.  He has a 60 pack-year smoking history. He has never used smokeless tobacco. He reports that he drinks about 3.6 ounces of alcohol per week. He reports that he does not use illicit drugs.  Allergies: No Known Allergies  Medication  Aspirin 81 mg q day Zocor 10 mg qhs  (Not in a hospital admission)  Results for orders placed during the hospital encounter of 04/03/12 (from the past 48 hour(s))  HEPATIC FUNCTION PANEL     Status: Normal   Collection Time   04/03/12  5:12 AM      Component Value Range Comment   Total Protein 7.0  6.0 - 8.3 g/dL    Albumin 3.7  3.5 - 5.2 g/dL    AST 20  0 - 37 U/L    ALT 16  0 - 53 U/L    Alkaline Phosphatase 75  39 - 117 U/L    Total Bilirubin 0.3  0.3 - 1.2 mg/dL    Bilirubin, Direct <1.6  0.0 - 0.3 mg/dL    Indirect Bilirubin NOT CALCULATED  0.3 - 0.9 mg/dL   CBC WITH DIFFERENTIAL     Status: Abnormal   Collection Time   04/03/12  5:12 AM      Component Value Range  Comment   WBC 13.9 (*) 4.0 - 10.5 K/uL    RBC 4.71  4.22 - 5.81 MIL/uL    Hemoglobin 13.3  13.0 - 17.0 g/dL    HCT 16.1  09.6 - 04.5 %    MCV 87.5  78.0 - 100.0 fL    MCH 28.2  26.0 - 34.0 pg    MCHC 32.3  30.0 - 36.0 g/dL    RDW 40.9 (*) 81.1 - 15.5 %    Platelets 155  150 - 400 K/uL    Neutrophils Relative 74  43 - 77 %    Neutro Abs 10.3 (*) 1.7 - 7.7 K/uL    Lymphocytes Relative 13  12 - 46 %    Lymphs Abs 1.8  0.7 - 4.0 K/uL    Monocytes Relative 6  3 - 12 %    Monocytes Absolute 0.8  0.1 - 1.0 K/uL    Eosinophils Relative 8 (*) 0 - 5 %    Eosinophils Absolute 1.0 (*) 0.0 - 0.7 K/uL    Basophils Relative 0  0 - 1 %    Basophils Absolute 0.0  0.0 - 0.1 K/uL   APTT     Status: Normal   Collection Time   04/03/12  5:27 AM      Component Value Range Comment   aPTT 32  24 - 37 seconds   PROTIME-INR     Status: Normal   Collection Time   04/03/12  5:27 AM       Component Value Range Comment   Prothrombin Time 13.0  11.6 - 15.2 seconds    INR 0.99  0.00 - 1.49   POCT I-STAT TROPONIN I     Status: Abnormal   Collection Time   04/03/12  5:41 AM      Component Value Range Comment   Troponin i, poc 0.10 (*) 0.00 - 0.08 ng/mL    Comment NOTIFIED PHYSICIAN      Comment 3            POCT I-STAT, CHEM 8     Status: Abnormal   Collection Time   04/03/12  5:43 AM      Component Value Range Comment   Sodium 140  135 - 145 mEq/L    Potassium 4.3  3.5 - 5.1 mEq/L    Chloride 107  96 - 112 mEq/L    BUN 21  6 - 23 mg/dL    Creatinine, Ser 9.14 (*) 0.50 - 1.35 mg/dL    Glucose, Bld 782 (*) 70 - 99 mg/dL    Calcium, Ion 9.56  2.13 - 1.30 mmol/L    TCO2 24  0 - 100 mmol/L    Hemoglobin 13.9  13.0 - 17.0 g/dL    HCT 08.6  57.8 - 46.9 %    Dg Chest Portable 1 View  04/03/2012  *RADIOLOGY REPORT*  Clinical Data: Chest pain  PORTABLE CHEST - 1 VIEW  Comparison: 12/15/2011  Findings: The stable postoperative change in the mediastinum. Normal heart size and pulmonary vascularity.  Shallow inspiration with vascular crowding.  No focal airspace consolidation.  No blunting of costophrenic angles.  No pneumothorax.  IMPRESSION: No evidence of active pulmonary disease.   Original Report Authenticated By: Burman Nieves, M.D.     Review of Systems  Constitutional: Negative for fever, chills, weight loss, malaise/fatigue and diaphoresis.  HENT: Negative for hearing loss, ear pain, nosebleeds, congestion, neck pain, tinnitus and ear discharge.   Eyes: Negative for  blurred vision and photophobia.  Respiratory: Negative for cough, hemoptysis, sputum production, shortness of breath and wheezing.   Cardiovascular: Positive for chest pain and leg swelling. Negative for palpitations, orthopnea, claudication and PND.  Gastrointestinal: Positive for nausea. Negative for heartburn, vomiting, abdominal pain, diarrhea, constipation and blood in stool.  Genitourinary: Negative  for dysuria, urgency, frequency, hematuria and flank pain.  Musculoskeletal: Negative for back pain and joint pain.  Skin: Negative for itching and rash.  Neurological: Negative for dizziness, tingling, tremors, sensory change, focal weakness, seizures, weakness and headaches.  Psychiatric/Behavioral: Negative for depression, suicidal ideas and substance abuse.    Blood pressure 127/64, pulse 67, temperature 97.8 F (36.6 C), temperature source Oral, resp. rate 20, SpO2 89.00%. Physical Exam  Constitutional: He is oriented to person, place, and time. He appears well-developed and well-nourished. No distress.  HENT:  Head: Normocephalic.  Eyes: EOM are normal. Right eye exhibits no discharge. No scleral icterus.  Neck: Normal range of motion. Neck supple. No JVD present. No tracheal deviation present. No thyromegaly present.  Cardiovascular: Normal rate and regular rhythm.  Exam reveals no gallop and no friction rub.   Murmur heard. Respiratory: No stridor. No respiratory distress. He has no wheezes. He has no rales. He exhibits no tenderness.  GI: He exhibits no distension. There is no tenderness. There is no rebound and no guarding.  Musculoskeletal: He exhibits edema. He exhibits no tenderness.  Neurological: He is alert and oriented to person, place, and time.  Skin: No rash noted. He is not diaphoretic. No erythema.  Psychiatric: He has a normal mood and affect.     Assessment/Plan  1. NSTEMI 2. CAD s/p CABG x 4  3. PAD s/p right CEA 4. HTN 5. Hyperlipidemia  I will admit the patient to cardiac ICU for observation and perform serial cardiac markers to see which way they are trending.  I will start the patient of Aspirin, beta-blockers, high intensity statin, Plavix and Heparin drip. I will obtain TTE to evaluate his LV function, start him on nitrates for pain control and keep him NPO for cardiac cath on Monday.  Jaiya Mooradian E 04/03/2012, 6:44 AM

## 2012-04-03 NOTE — ED Provider Notes (Signed)
History     CSN: [id number]  Arrival date & time 04/03/12  0453   First MD Initiated Contact with Patient 04/03/12 0525      Chief Complaint  Patient presents with  . Chest Pain    (Consider location/radiation/quality/duration/timing/severity/associated sxs/prior treatment) HPI Please note that this is a late entry. This patient was seen and examined by me shortly after his arrival to the emergency department. The patient is a pleasant 71 year old man with a history of coronary artery disease-status post four-vessel CABG in June 2013. He also has a history of peripheral vascular disease, paroxysmal atrial flutter, hypertension and COPD.  Patient presents to the emergency department after awakening from sleep at 2:45 AM with diffuse chest pain. He describes his pain as 7/10, aching and pressure like. It is nonradiating. The patient has associated shortness of breath. He denies associated nausea, diaphoresis and lightheadedness. His pain has been constant at a 7/10 since he awoke from sleep. The patient did not take nitroglycerin or aspirin prior to arrival. He reports compliance with all medications and denies any recent changes to his medical regimen. Patient is followed by a local cardiology.  Patient denies exacerbating or relieving factors. He says that he has experienced similar but, less severe chest pain in the past. His pain tonight is worse than any which she experienced prior to CABG in June of this year.  Past Medical History  Diagnosis Date  . Aorto-iliac disease     bilat with stent 2007  . CAD (coronary artery disease)     with loop to the circumflex 2000, inferolateral infarction  . Hypertension   . Paroxysmal atrial flutter     ablation  . ED (erectile dysfunction)   . COPD (chronic obstructive pulmonary disease)   . Hyperlipidemia   . Stroke   . Shortness of breath   . GERD (gastroesophageal reflux disease)   . Gout     Past Surgical History  Procedure Date    . Spinal fusion   . Hand surgery   . Neck fusion 1975  . Eye surgery   . Coronary artery bypass graft 10/31/2011    Procedure: CORONARY ARTERY BYPASS GRAFTING (CABG);  Surgeon: Delight Ovens, MD;  Location: [facility] OR;  Service: Open Heart Surgery;  Laterality: N/A;  times three using  Left Internal Mammary Artery and Right Greater Saphenous Vein Graft harvested endoscopically; TEE  . Endarterectomy 10/31/2011    Procedure: ENDARTERECTOMY CAROTID;  Surgeon: Nada Libman, MD;  Location: [facility] OR;  Service: Vascular;  Laterality: Right;  with patch angioplasty   . Carotid endarterectomy 10-31-2011    right    Family History  Problem Relation Age of Onset  . Cancer Mother     History  Substance Use Topics  . Smoking status: Current Some Day Smoker -- 1.0 packs/day for 60 years    Types: Cigarettes  . Smokeless tobacco: Never Used  . 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  Allergies  Review of patient's allergies indicates no known allergies.  Home Medications   Current Outpatient Rx  Name  Route  Sig  Dispense  Refill  . ACETAMINOPHEN 325 MG PO TABS   Oral   Take 650 mg by mouth every 6 (six) hours as needed.         . ALLOPURINOL 300 MG PO TABS   Oral   Take 300 mg by mouth daily.         . AMIODARONE HCL 400 MG PO  TABS   Oral   Take 1 tablet (400 mg total) by mouth 2 (two) times daily. X 1 week, then decrease to 200 mg po BID   75 tablet   1   . ASPIRIN 325 MG PO TBEC   Oral   Take 325 mg by mouth daily.         Marland Kitchen COLCRYS 0.6 MG PO TABS      TAKE 1 TABLET (0.6 MG TOTAL) BY MOUTH DAILY.   30 tablet   0   . METOPROLOL TARTRATE 25 MG PO TABS   Oral   Take 12.5 mg by mouth 2 (two) times daily.         Marland Kitchen OMEPRAZOLE 20 MG PO CPDR   Oral   Take 20 mg by mouth daily. 1/2 before food           BP 127/64  Pulse 67  Temp 97.8 F (36.6 C) (Oral)  Resp 24  SpO2 89%  Physical Exam Gen: well developed and well nourished appearing, the patient appears uncomfortable sitting upright on gurney. Head: NCAT Eyes: PERL, EOMI Nose: no epistaixis or rhinorrhea Mouth/throat: mucosa is moist and pink Neck: supple, no stridor or meningeal ED Lungs: CTA B, no wheezing, rhonchi or rales her respiratory rate is 32 per minute CV: Regular rate and rhythm without murmur, strong peripheral pulses, trace and symmetric pretibial edema. Abd: soft, notender, nondistended Back: no ttp, no cva ttp Skin: no rashese, wnl Neuro: CN ii-xii grossly intact, no focal deficits Psyche; mildly anxious affect,  calm and cooperative.   ED Course  Procedures (including critical care time)  Results for orders placed during the hospital encounter of 04/03/12 (from the past 24 hour(s))  HEPATIC FUNCTION PANEL     Status: Normal   Collection Time   04/03/12  5:12 AM      Component Value Range   Total Protein 7.0  6.0 - 8.3 g/dL   Albumin 3.7  3.5 - 5.2 g/dL   AST 20  0 - 37 U/L   ALT 16  0 - 53 U/L   Alkaline Phosphatase 75  39 - 117 U/L   Total Bilirubin 0.3  0.3 - 1.2 mg/dL   Bilirubin, Direct <4.0  0.0 - 0.3 mg/dL   Indirect Bilirubin NOT CALCULATED  0.3 - 0.9 mg/dL  CBC WITH DIFFERENTIAL     Status: Abnormal   Collection Time   04/03/12  5:12 AM      Component Value Range   WBC 13.9 (*) 4.0 - 10.5 K/uL    RBC 4.71  4.22 - 5.81 MIL/uL   Hemoglobin 13.3  13.0 - 17.0 g/dL   HCT 98.1  19.1 - 47.8 %   MCV 87.5  78.0 - 100.0 fL   MCH 28.2  26.0 -  34.0 pg   MCHC 32.3  30.0 - 36.0 g/dL   RDW 11.9 (*) 14.7 - 82.9 %   Platelets 155  150 - 400 K/uL   Neutrophils Relative 74  43 - 77 %   Neutro Abs 10.3 (*) 1.7 - 7.7 K/uL   Lymphocytes Relative 13  12 - 46 %   Lymphs Abs 1.8  0.7 - 4.0 K/uL   Monocytes Relative 6  3 - 12 %   Monocytes Absolute 0.8  0.1 - 1.0 K/uL   Eosinophils Relative 8 (*) 0 - 5 %   Eosinophils Absolute 1.0 (*) 0.0 - 0.7 K/uL   Basophils Relative 0  0 - 1 %   Basophils Absolute 0.0  0.0 - 0.1 K/uL  APTT     Status: Normal   Collection Time   04/03/12  5:27 AM      Component Value Range   aPTT 32  24 - 37 seconds  PROTIME-INR     Status: Normal   Collection Time   04/03/12  5:27 AM      Component Value Range   Prothrombin Time 13.0  11.6 - 15.2 seconds   INR 0.99  0.00 - 1.49  POCT I-STAT TROPONIN I     Status: Abnormal   Collection Time   04/03/12  5:41 AM      Component Value Range   Troponin i, poc 0.10 (*) 0.00 - 0.08 ng/mL   Comment NOTIFIED PHYSICIAN     Comment 3           POCT I-STAT, CHEM 8     Status: Abnormal   Collection Time   04/03/12  5:43 AM      Component Value Range   Sodium 140  135 - 145 mEq/L   Potassium 4.3  3.5 - 5.1 mEq/L   Chloride 107  96 - 112 mEq/L   BUN 21  6 - 23 mg/dL   Creatinine, Ser 5.62 (*) 0.50 - 1.35 mg/dL   Glucose, Bld 130 (*) 70 - 99 mg/dL   Calcium, Ion 8.65  7.84 - 1.30 mmol/L   TCO2 24  0 - 100 mmol/L   Hemoglobin 13.9  13.0 - 17.0 g/dL   HCT 69.6  29.5 - 28.4 %    Dg Chest Portable 1 View  04/03/2012  *RADIOLOGY REPORT*  Clinical Data: Chest pain  PORTABLE CHEST - 1 VIEW  Comparison: 12/15/2011  Findings: The stable postoperative change in the mediastinum. Normal heart size and pulmonary vascularity.  Shallow inspiration with vascular crowding.  No focal airspace consolidation.  No blunting of costophrenic angles.   No pneumothorax.  IMPRESSION: No evidence of active pulmonary disease.   Original Report Authenticated By: Burman Nieves, M.D.    EKG: NSR, normal QRS, normal rate, new ST depression of approx 1.42mm notable in anterior leads compared to July 2013 EKG.  1. MI, acute, non ST segment elevation   2. Hypertension   3. Shortness of breath   4. Depression     MDM  Patient with significant cardiac history presents with classic chest pain and new ST depression in anterior leads of EKG compared to prior study from this year. The patient appears to be experiencing a non-ST elevation MI. His initial troponin is 0.10. We are treating with aspirin and initiated nitroglycerin infusion, heparin infusion and treatment with opiate pain medication. The patient is mildly hypertensive. However, expect that this will respond to nitroglycerin infusion. The patient is currently beta blocked.  I have discussed the patient's case with the cardiologist on call who has seen and evaluated the patient and will admit. He agrees with current management plan.  CRITICAL CARE Performed by: Brandt Loosen   Total critical care time: 35  Critical care time was exclusive of separately billable procedures and treating other patients.  Critical care was necessary to treat or prevent imminent or life-threatening deterioration.  Critical care was time spent personally by me on the following activities: development of treatment plan with patient and/or surrogate as well as nursing, discussions with consultants, evaluation of patient's response to treatment, examination of patient, obtaining history from patient or surrogate, ordering and performing treatments and interventions, ordering and review of laboratory studies, ordering and review of radiographic studies, pulse oximetry and re-evaluation of patient's condition.         Brandt Loosen, MD 04/03/12 [phone]

## 2012-04-03 NOTE — Progress Notes (Signed)
Unable to ambulate patient or sit on the side of the bed because of oxygen saturations. Pt continues to have shortness of breath whenever he will not use his ventimask. At times, the patient refuses to keep ventimask on, for long periods, because he states he doesn't want to. Also he will not wear his SCDs. Pt has been educated on the importance of both the scds and ventimask but continues to be noncompliant at times. Family at bedside and aware of pt issues. Pt alert and oriented and able to make own decisions. He states he understands the importance of both but will put them on when he is ready.

## 2012-04-04 ENCOUNTER — Inpatient Hospital Stay (HOSPITAL_COMMUNITY): Payer: Medicare PPO

## 2012-04-04 LAB — BASIC METABOLIC PANEL
CO2: 26 mEq/L (ref 19–32)
Calcium: 9.3 mg/dL (ref 8.4–10.5)
Creatinine, Ser: 1.67 mg/dL — ABNORMAL HIGH (ref 0.50–1.35)
Glucose, Bld: 132 mg/dL — ABNORMAL HIGH (ref 70–99)

## 2012-04-04 LAB — LIPID PANEL
HDL: 25 mg/dL — ABNORMAL LOW (ref >39)
Triglycerides: 183 mg/dL — ABNORMAL HIGH (ref <150)

## 2012-04-04 LAB — TROPONIN I: Troponin I: 15.93 ng/mL (ref <0.30)

## 2012-04-04 LAB — HEPARIN LEVEL (UNFRACTIONATED): Heparin Unfractionated: 0.23 IU/mL — ABNORMAL LOW (ref 0.30–0.70)

## 2012-04-04 MED ORDER — HEPARIN (PORCINE) IN NACL 100-0.45 UNIT/ML-% IJ SOLN
1250.0000 [IU]/h | INTRAMUSCULAR | Status: DC
  Administered 2012-04-04: 1250 [IU]/h via INTRAVENOUS
  Filled 2012-04-04 (×2): qty 250

## 2012-04-04 MED ORDER — HEPARIN (PORCINE) IN NACL 100-0.45 UNIT/ML-% IJ SOLN
1500.0000 [IU]/h | INTRAMUSCULAR | Status: DC
  Administered 2012-04-05: 1500 [IU]/h via INTRAVENOUS
  Filled 2012-04-04 (×2): qty 250

## 2012-04-04 NOTE — Progress Notes (Addendum)
ANTICOAGULATION CONSULT NOTE - Initial Consult  Pharmacy Consult for Heparin Indication: R/O PE  No Known Allergies  Patient Measurements: Height: 6' 1.62" (187 cm) Weight: 186 lb 8.2 oz (84.6 kg) IBW/kg (Calculated) : 81.33  Heparin Dosing Weight: 84.6 kg  Vital Signs: BP: 140/69 mmHg (11/24 1800) Pulse Rate: 88  (11/24 1800)  Labs:  Basename 04/04/12 1810 04/04/12 1504 04/04/12 0927 04/03/12 1925 04/03/12 0735 04/03/12 0543 04/03/12 0527 04/03/12 0512  HGB -- -- -- -- -- 13.9 -- 13.3  HCT -- -- -- -- -- 41.0 -- 41.2  PLT -- -- -- -- -- -- -- 155  APTT -- -- -- -- -- -- 32 --  LABPROT -- -- -- -- -- -- 13.0 --  INR -- -- -- -- -- -- 0.99 --  HEPARINUNFRC 0.23* -- -- -- -- -- -- --  CREATININE -- -- 1.67* -- 1.57* 1.60* -- --  CKTOTAL -- -- -- -- -- -- -- --  CKMB -- -- -- -- -- -- -- --  TROPONINI -- 15.93* >20.00* 17.61* -- -- -- --    Estimated Creatinine Clearance: 45.3 ml/min (by C-G formula based on Cr of 1.67).   Medical History: Past Medical History  Diagnosis Date  . Aorto-iliac disease     bilat with stent 2007  . CAD (coronary artery disease)     with loop to the circumflex 2000, inferolateral infarction  . Hypertension   . Paroxysmal atrial flutter     ablation  . ED (erectile dysfunction)   . COPD (chronic obstructive pulmonary disease)   . Hyperlipidemia   . Stroke   . Shortness of breath   . GERD (gastroesophageal reflux disease)   . Gout     Assessment: 48 YOM s/p CABG 6/13 presented with NSTEMI with emergent cath yesterday. He continued to have chest pain, and troponin continued increasing, D-Dimer is mildly elevated and he has a high O2 requirement, concern for PE, to restart heparin with no bolus. Hgb/Plt wnl, no anticoagulation PTA.  Heparin level slightly subtherapeutic on 1250 units/hr.  No bleeding noted per chart notes.  Goal of Therapy:  Heparin level 0.3-0.7 units/ml Monitor platelets by anticoagulation protocol: Yes   Plan:    1. Increase IV heparin to 1400 units/hr. 2. Check heparin level in 6 hrs. 3. Continue daily heparin level and CBC.  Reece Leader, Pharm D 04/04/2012 7:12 PM

## 2012-04-04 NOTE — Progress Notes (Signed)
Utilization Review Completed.Keyvon Herter T11/24/2013   

## 2012-04-04 NOTE — Progress Notes (Signed)
SUBJECTIVE:  SOB improved but on O2 at 6L to maintain O2sats at 90%  OBJECTIVE:   Vitals:   Filed Vitals:   04/04/12 0400 04/04/12 0500 04/04/12 0700 04/04/12 0800  BP: 131/58 138/66 132/66 128/67  Pulse: 85 87 91 82  Temp: 97.9 F (36.6 C)     TempSrc: Oral     Resp:      Height:      Weight:  84.6 kg (186 lb 8.2 oz)    SpO2: 90% 91% 87% 90%   I&O's:   Intake/Output Summary (Last 24 hours) at 04/04/12 0900 Last data filed at 04/04/12 0800  Gross per 24 hour  Intake 3733.42 ml  Output   3850 ml  Net -116.58 ml   TELEMETRY: Reviewed telemetry pt in NSR:     PHYSICAL EXAM General: Well developed, well nourished, in no acute distress Head: Eyes PERRLA, No xanthomas.   Normal cephalic and atramatic  Lungs:   Clear bilaterally to auscultation and percussion. Heart:   HRRR S1 S2 Pulses are 2+ & equal.  2/6 SM at LLSB Abdomen: Bowel sounds are positive, abdomen soft and non-tender without masses  Extremities:   No clubbing, cyanosis or edema.  DP +1 Neuro: Alert and oriented X 3. Psych:  Good affect, responds appropriately   LABS: Basic Metabolic Panel:  Basename 04/03/12 0735 04/03/12 0543  NA 139 140  K 3.7 4.3  CL 106 107  CO2 23 --  GLUCOSE 135* 108*  BUN 20 21  CREATININE 1.57* 1.60*  CALCIUM 9.5 --  MG 1.9 --  PHOS -- --   Liver Function Tests:  [facility] 04/03/12 0735 04/03/12 0512  AST 20 20  ALT 14 16  ALKPHOS 72 75  BILITOT 0.3 0.3  PROT 6.6 7.0  ALBUMIN 3.7 3.7   No results found for this basename: LIPASE:2,AMYLASE:2 in the last 72 hours CBC:  Basename 04/03/12 0543 04/03/12 0512  WBC -- 13.9*  NEUTROABS -- 10.3*  HGB 13.9 13.3  HCT 41.0 41.2  MCV -- 87.5  PLT -- 155   Cardiac Enzymes:  Basename 04/03/12 1925 04/03/12 1237 04/03/12 0733  CKTOTAL -- -- --  CKMB -- -- --  CKMBINDEX -- -- --  TROPONINI 17.61* 7.57* 0.53*   BNP: No components found with this basename: POCBNP:3 D-Dimer:  Basename 04/03/12 1352  DDIMER 0.70*    Hemoglobin A1C:  Basename 04/03/12 0735  HGBA1C 5.4   Fasting Lipid Panel:  Basename 04/04/12 0555  CHOL 140  HDL 25*  LDLCALC 78  TRIG 578*  CHOLHDL 5.6  LDLDIRECT --   Thyroid Function Tests:  Basename 04/03/12 0735  TSH 1.338  T4TOTAL --  T3FREE --  THYROIDAB --   Anemia Panel: No results found for this basename: VITAMINB12,FOLATE,FERRITIN,TIBC,IRON,RETICCTPCT in the last 72 hours Coag Panel:   Lab Results  Component Value Date   INR 0.99 04/03/2012   INR 1.38 11/03/2011   INR 1.57* 10/31/2011    RADIOLOGY: Dg Chest Portable 1 View  04/03/2012  *RADIOLOGY REPORT*  Clinical Data: Chest pain  PORTABLE CHEST - 1 VIEW  Comparison: 12/15/2011  Findings: The stable postoperative change in the mediastinum. Normal heart size and pulmonary vascularity.  Shallow inspiration with vascular crowding.  No focal airspace consolidation.  No blunting of costophrenic angles.  No pneumothorax.  IMPRESSION: No evidence of active pulmonary disease.   Original Report Authenticated By: Burman Nieves, M.D.    ASSESSMENT:  1.  S/P NSTEMI with emergent cath showing patent LIMA  to LAD, patent SVG to RCA, occluded SVG to OM and occluded SVG to diagonal.  His cath prior to CABG showed occluded left circumflex with a small OM that was grafted at time of CABG and is now occluded.  The diagonal was a large vessel of only 50-70% stenosis and the graft to this is now occluded but there appeared to be brisk flow down the diagonal at time of cath yesterday.  He continues to have 5/10 CP although appears to be quite comfortable.  His Troponin continues to increase. ? Other etiology for chest pain such as PE.  D-Dimer is mildly elevated but he has a high O2 requirement at present which is also concerning for PE 2.  Hypoxia ? secondary to acute diastolic CHF vs. PE.  LVEDP was at time of cath and he has been on Lasix but not diuresing well. 3.  Aorto-iliac disease s/p bilateral stenting in 2007 4.   Carotid artery disease s/p right CEA 10/2011 5.  HTN 6.  PAF s/p ablation 7.  COPD  PLAN:   1.  Stat BMET to reassess renal function.  If ok then proceed with Chest CT angio to rule out PE 2.  Continue IV NTG gtt for now.   3.  IV Heparin gtt until PE ruled out 4.  Continue to cycle cardiac enzymes until Troponin peaks 5.  Continue ASA/Plavix/beta blocker/statin 6.  Continue IV Lasix for diuresis  Quintella Reichert, MD  04/04/2012  9:00 AM

## 2012-04-04 NOTE — Progress Notes (Signed)
ANTICOAGULATION CONSULT NOTE - Initial Consult  Pharmacy Consult for Heparin Indication: R/O PE  No Known Allergies  Patient Measurements: Height: 6' 1.62" (187 cm) Weight: 186 lb 8.2 oz (84.6 kg) IBW/kg (Calculated) : 81.33  Heparin Dosing Weight: 84.6 kg  Vital Signs: Temp: 97.9 F (36.6 C) (11/24 0400) Temp src: Oral (11/24 0400) BP: 128/67 mmHg (11/24 0800) Pulse Rate: 82  (11/24 0800)  Labs:  Basename 04/03/12 1925 04/03/12 1237 04/03/12 0735 04/03/12 0733 04/03/12 0543 04/03/12 0527 04/03/12 0512  HGB -- -- -- -- 13.9 -- 13.3  HCT -- -- -- -- 41.0 -- 41.2  PLT -- -- -- -- -- -- 155  APTT -- -- -- -- -- 32 --  LABPROT -- -- -- -- -- 13.0 --  INR -- -- -- -- -- 0.99 --  HEPARINUNFRC -- -- -- -- -- -- --  CREATININE -- -- 1.57* -- 1.60* -- --  CKTOTAL -- -- -- -- -- -- --  CKMB -- -- -- -- -- -- --  TROPONINI 17.61* 7.57* -- 0.53* -- -- --    Estimated Creatinine Clearance: 48.2 ml/min (by C-G formula based on Cr of 1.57).   Medical History: Past Medical History  Diagnosis Date  . Aorto-iliac disease     bilat with stent 2007  . CAD (coronary artery disease)     with loop to the circumflex 2000, inferolateral infarction  . Hypertension   . Paroxysmal atrial flutter     ablation  . ED (erectile dysfunction)   . COPD (chronic obstructive pulmonary disease)   . Hyperlipidemia   . Stroke   . Shortness of breath   . GERD (gastroesophageal reflux disease)   . Gout     Assessment: 1 YOM s/p CABG 6/13 presented with NSTEMI with emergent cath yesterday. He continued to have chest pain, and troponin continued increasing, D-Dimer is mildly elevated and he has a high O2 requirement, concern for PE, to restart heparin with no bolus. Hgb/Plt wnl, no anticoagulation PTA   Goal of Therapy:  Heparin level 0.3-0.7 units/ml Monitor platelets by anticoagulation protocol: Yes   Plan:  - Heparin with no bolus 1250 units/hr - f/u 8 hr heparin level - heparin level  and CBC daily - f/u Chest CT if available  Bayard Hugger, PharmD, BCPS  Clinical Pharmacist  Pager: [phone]

## 2012-04-04 NOTE — Progress Notes (Signed)
Patient's oxygen saturations decreased to 78-84% after patient removed venturi mask to "wipe his eyes." Venturi mask replaced at 40% FiO2. Patient's oxygen slowly increased to 85-89%. Oxygen delivery changed to 100% nonrebreather, saturations increased 90-96%. Patient educated on the importance of wearing the oxygen and calling staff for assistance with personal care needs. Patient verbalized understanding.

## 2012-04-04 NOTE — Cardiovascular Report (Signed)
NAMEDEONTREY, MASSI NO.:  [id number]  MEDICAL RECORD NO.:  [id number]  LOCATION:  2901                         FACILITY:  [facility]  PHYSICIAN:  Nicki Guadalajara, M.D.     DATE OF BIRTH:  1941/04/06  DATE OF PROCEDURE:  04/03/2012 DATE OF DISCHARGE:                           CARDIAC CATHETERIZATION   PROCEDURE:  Emergent cardiac catheterization.  PATIENT PROFILE:  Nathan [name] is a 71 year old gentleman, who has established coronary and peripheral vascular disease.  He has a history of COPD, remote stroke as well as hypertension, hyperlipidemia. Apparently he underwent CABG revascularization surgery in June and carotid endarterectomy and had a LIMA to his LAD, vein graft to his diagonal vessel, vein graft to the OM 1, and vein graft to the PDA.  He did have total occlusion of his native circumflex noted at catheterization in April, 2 small marginal vessels.  The patient also is status post bilateral iliac stenting.  He apparently had awakened from sleep at 2:45 a.m. with chest pain.  He presented to [facility] where he was admitted with unstable angina.  Initial point of care marker was 0.1.  The patient continued to have recurrent chest pain on the coronary care unit with anterolateral ST-segment changes.  He was seen by Dr. Marca Ancona and due to his ongoing chest pain despite 90 mcg of intracoronary nitroglycerin, emergent cardiac catheterization was recommended, and I was notified to do the procedure.  PROCEDURE:  Upon arrival to the catheterization laboratory, the patient just had minimal residual discomfort.  He was given 1 mg Versed plus 25 mcg fentanyl.  His chest pain completely ameliorated.  Right femoral artery was punctured anteriorly and a 6-French sheath was inserted without difficulty.  Diagnostic catheterization was done utilizing 6- [place] Judkins for left and right coronary catheters.  The right catheter was used for selective  angiography into the 3 vein graft sites. LIMA catheter was used for selective angiography into the left internal mammary artery.  With a history of murmur as well as to further evaluate wall motion, biventricular left ventriculography was performed.  The patient was entirely pain free throughout the entire procedure and had stable hemodynamics.  At that time, the computers were unavailable to review his prior catheterization films but the report was reviewed. With the patient being completely pain free and with excellent filling of his native diagonal system as well as old occlusion of his proximal circumflex, a decision was made to treat him medically.  ACT was obtained.  His arterial sheath was pulled.  Hemostasis was obtained by direct manual pressure.  The patient left the catheterization laboratory with stable hemodynamics chest pain free.  HEMODYNAMIC DATA:  Central aortic pressure 135/70.  Left ventricular pressure 135/13.  Post A-wave 26.  ANGIOGRAPHIC DATA:  There was evidence for coronary calcification.  Left main coronary artery was otherwise normal and bifurcated into an LAD and left circumflex system.  The LAD had 40-50% narrowing in the region of the first diagonal takeoff.  The first diagonal vessel had brisk TIMI-3 flow.  One can see where the graft did anastomose into the midportion of this diagonal vessel.  A flush and fill phenomena was seen  in the mid LAD.  The circumflex vessel was totally occluded proximally.  The right coronary artery had diffuse 95% proximal and mid stenosis.  A flush and fill phenomena was seen distally.  The vein graft supplying the distal right coronary artery was widely patent.  This filled the PDA system, distal RCA, and there was also faint collaterals to the circumflex territory.  The vein graft supplying the circumflex marginal vessel was occluded at its origin.  The vein graft supplying the diagonal vessel was occluded at its  origin.  The left internal mammary artery graft supplying the mid LAD was widely patent.  Left ventriculography revealed ejection fraction of approximately 50- 55%.  There was subtle mild inferior hypocontractility on the RAO projection.  On the LAO projection, there was very mild inferolateral hypocontractility.  IMPRESSION: 1. Low normal global left ventricular function with mild inferior and     low to mid posterolateral hypocontractility. 2. Significant native coronary artery disease with 50% left anterior     descending stenosis in the region of the first diagonal takeoff,     total occlusion of the proximal circumflex, which is old and     diffusely diseased.  Proximal to mid 95% stenoses in the right     coronary artery. 3. Patent left internal mammary artery to the mid left anterior     descending. 4. Patent vein graft to the distal right coronary artery. 5. Occluded vein graft to the circumflex marginal vessel. 6. Occluded vein graft to the diagonal vessel with brisk TIMI-3 flow     and filling of the diagonal vessel via the native left injection.  RECOMMENDATION:  Medical therapy.          ______________________________ Nicki Guadalajara, M.D.     TK/MEDQ  D:  04/03/2012  T:  04/04/2012  Job:  [id number]  cc:   Marca Ancona, MD Lyn Records, M.D.

## 2012-04-05 ENCOUNTER — Inpatient Hospital Stay (HOSPITAL_COMMUNITY): Payer: Medicare PPO

## 2012-04-05 ENCOUNTER — Ambulatory Visit (HOSPITAL_COMMUNITY): Payer: Medicare PPO

## 2012-04-05 ENCOUNTER — Encounter (HOSPITAL_COMMUNITY): Payer: Medicare PPO

## 2012-04-05 DIAGNOSIS — J811 Chronic pulmonary edema: Secondary | ICD-10-CM | POA: Diagnosis present

## 2012-04-05 DIAGNOSIS — R4182 Altered mental status, unspecified: Secondary | ICD-10-CM

## 2012-04-05 DIAGNOSIS — I214 Non-ST elevation (NSTEMI) myocardial infarction: Secondary | ICD-10-CM | POA: Diagnosis present

## 2012-04-05 DIAGNOSIS — I5033 Acute on chronic diastolic (congestive) heart failure: Secondary | ICD-10-CM

## 2012-04-05 DIAGNOSIS — J9600 Acute respiratory failure, unspecified whether with hypoxia or hypercapnia: Secondary | ICD-10-CM

## 2012-04-05 DIAGNOSIS — I509 Heart failure, unspecified: Secondary | ICD-10-CM

## 2012-04-05 LAB — TROPONIN I: Troponin I: 14.23 ng/mL (ref <0.30)

## 2012-04-05 LAB — HEPARIN LEVEL (UNFRACTIONATED)
Heparin Unfractionated: 0.28 IU/mL — ABNORMAL LOW (ref 0.30–0.70)
Heparin Unfractionated: 0.28 IU/mL — ABNORMAL LOW (ref 0.30–0.70)

## 2012-04-05 LAB — CBC
MCH: 28.8 pg (ref 26.0–34.0)
MCHC: 33.1 g/dL (ref 30.0–36.0)
Platelets: 134 10*3/uL — ABNORMAL LOW (ref 150–400)

## 2012-04-05 LAB — POCT I-STAT 3, ART BLOOD GAS (G3+)
Acid-base deficit: 1 mmol/L (ref 0.0–2.0)
Bicarbonate: 22.8 mEq/L (ref 20.0–24.0)
O2 Saturation: 93 %
Patient temperature: 98.9
TCO2: 24 mmol/L (ref 0–100)

## 2012-04-05 MED ORDER — ALBUTEROL SULFATE (5 MG/ML) 0.5% IN NEBU
2.5000 mg | INHALATION_SOLUTION | Freq: Four times a day (QID) | RESPIRATORY_TRACT | Status: DC
  Administered 2012-04-05 – 2012-04-07 (×8): 2.5 mg via RESPIRATORY_TRACT
  Filled 2012-04-05 (×9): qty 0.5

## 2012-04-05 MED ORDER — SODIUM CHLORIDE 0.9 % IV SOLN
1.0000 mg | Freq: Once | INTRAVENOUS | Status: AC
  Administered 2012-04-05: 1 mg via INTRAVENOUS
  Filled 2012-04-05: qty 0.2

## 2012-04-05 MED ORDER — TECHNETIUM TC 99M DIETHYLENETRIAME-PENTAACETIC ACID: 40.0000 | Freq: Once | INTRAVENOUS | Status: AC | PRN

## 2012-04-05 MED ORDER — FUROSEMIDE 10 MG/ML IJ SOLN: 40.0000 mg | Freq: Two times a day (BID) | INTRAMUSCULAR | Status: DC

## 2012-04-05 MED ORDER — GUAIFENESIN ER 600 MG PO TB12
1200.0000 mg | ORAL_TABLET | Freq: Two times a day (BID) | ORAL | Status: DC
  Administered 2012-04-05 – 2012-04-07 (×6): 1200 mg via ORAL
  Filled 2012-04-05 (×8): qty 2

## 2012-04-05 MED ORDER — HEPARIN (PORCINE) IN NACL 100-0.45 UNIT/ML-% IJ SOLN
1900.0000 [IU]/h | INTRAMUSCULAR | Status: DC
  Administered 2012-04-05: 1600 [IU]/h via INTRAVENOUS
  Filled 2012-04-05 (×3): qty 250

## 2012-04-05 MED ORDER — THIAMINE HCL 100 MG/ML IJ SOLN
100.0000 mg | Freq: Every day | INTRAMUSCULAR | Status: DC
  Administered 2012-04-05: 100 mg via INTRAVENOUS
  Filled 2012-04-05 (×2): qty 1

## 2012-04-05 MED ORDER — TECHNETIUM TO 99M ALBUMIN AGGREGATED
6.0000 | Freq: Once | INTRAVENOUS | Status: AC | PRN
  Administered 2012-04-05: 5.5 via INTRAVENOUS

## 2012-04-05 MED ORDER — FUROSEMIDE 10 MG/ML IJ SOLN
40.0000 mg | Freq: Three times a day (TID) | INTRAMUSCULAR | Status: DC
  Administered 2012-04-05 (×2): 40 mg via INTRAVENOUS
  Filled 2012-04-05 (×3): qty 4

## 2012-04-05 NOTE — Consult Note (Signed)
PULMONARY  / CRITICAL CARE MEDICINE  Name: JOSHUS ROGAN MRN: [id number] DOB: 09-03-1940    LOS: 2  REFERRING MD :  Katrinka Blazing   CHIEF COMPLAINT:  Acute hypoxic respiratory failure   BRIEF PATIENT DESCRIPTION:  71 year old male admitted on 11/23 for NSTEMI. PCCM asked to see on 11/25 for persistent hypoxia (sats as low as 70s on room air) in spite of diuretics, V/Q scan was negative.   LINES / TUBES:   CULTURES: mrsa 11/23: negative   ANTIBIOTICS:  SIGNIFICANT EVENTS:  ECHO 11/23: EF 50-55%, mild to moderate MR V/q scan 11/25: no evidence of PE (low prob)  LEVEL OF CARE:  ICU PRIMARY SERVICE:  Katrinka Blazing  CONSULTANTS:  pulm CODE STATUS: full?  DIET:   DVT Px:  Heparin  GI Px:  PPI  HISTORY OF PRESENT ILLNESS:   71 y/o male with a history of CAD and tobacco abuse presented on 11/23 for chest pain evaluation. The am of 11/23  he work up from sleep with 8/10 chest pain that he described as tightness, non-radiating but associated with shortness of breath and nausea. Dx eval was c/w acute MI. He was admitted to the cardiology service. Treatment included emergent Left heart cath that showed occluded SVG to OM. Since admit he has had persistent hypoxia and rising trop I. Has had cough with thick white sputum, no fever, + SOB at rest. His LVEDP was 26 mmHg. He had a V/Q scan on 11/25 that was low-prob. PCCM asked to see on 11/15 for hypoxic respiratory failure.   PAST MEDICAL HISTORY :  Past Medical History  Diagnosis Date  . Aorto-iliac disease     bilat with stent 2007  . CAD (coronary artery disease)     with loop to the circumflex 2000, inferolateral infarction  . Hypertension   . Paroxysmal atrial flutter     ablation  . ED (erectile dysfunction)   . COPD (chronic obstructive pulmonary disease)   . Hyperlipidemia   . Stroke   . Shortness of breath   . GERD (gastroesophageal reflux disease)   . Gout    Past Surgical History  Procedure Date  . Spinal fusion   . Hand  surgery   . Neck fusion 1975  . Eye surgery   . Coronary artery bypass graft 10/31/2011    Procedure: CORONARY ARTERY BYPASS GRAFTING (CABG);  Surgeon: Delight Ovens, MD;  Location: [facility] OR;  Service: Open Heart Surgery;  Laterality: N/A;  times three using  Left Internal Mammary Artery and Right Greater Saphenous Vein Graft harvested endoscopically; TEE  . Endarterectomy 10/31/2011    Procedure: ENDARTERECTOMY CAROTID;  Surgeon: Nada Libman, MD;  Location: [facility] OR;  Service: Vascular;  Laterality: Right;  with patch angioplasty   . Carotid endarterectomy 10-31-2011    right   Prior to Admission medications   Medication Sig Start Date End Date Taking? Authorizing Provider  acetaminophen (TYLENOL) 325 MG tablet Take 650 mg by mouth every 6 (six) hours as needed.   Yes Historical Provider, MD  allopurinol (ZYLOPRIM) 100 MG tablet Take 200 mg by mouth daily.   Yes Historical Provider, MD  amLODipine (NORVASC) 10 MG tablet Take 10 mg by mouth daily.   Yes Historical Provider, MD  aspirin 325 MG tablet Take 325 mg by mouth at bedtime.   Yes Historical Provider, MD  buPROPion (WELLBUTRIN XL) 150 MG 24 hr tablet Take 150 mg by mouth daily.   Yes Historical Provider, MD  colchicine (COLCRYS) 0.6 MG tablet Take 0.6 mg by mouth daily.   Yes Historical Provider, MD  DOXYLAMINE SUCCINATE, SLEEP, PO Take 25 mg by mouth at bedtime.   Yes Historical Provider, MD  Lactobacillus-Inulin (CULTURELLE DIGESTIVE HEALTH) CAPS Take 1 capsule by mouth daily.   Yes Historical Provider, MD  Melatonin 3 MG TABS Take 2 tablets by mouth at bedtime.   Yes Historical Provider, MD  metoprolol tartrate (LOPRESSOR) 25 MG tablet Take 25 mg by mouth 2 (two) times daily.  11/16/11  Yes Wilmon Pali, PA  simvastatin (ZOCOR) 10 MG tablet Take 10 mg by mouth at bedtime.   Yes Historical Provider, MD   No Known Allergies  FAMILY HISTORY:  Family History  Problem Relation Age of Onset  . Cancer Mother    SOCIAL HISTORY:   reports that he has been smoking Cigarettes.  He has a 60 pack-year smoking history. He has never used smokeless tobacco. He reports that he drinks about 3.6 ounces of alcohol per week. He reports that he does not use illicit drugs.  REVIEW OF SYSTEMS:   Review of Systems  Constitutional: No weight loss, gain, night sweats, -Fevers, -chills,- fatigue .  HEENT: No headaches, visual changes, Difficulty swallowing, Tooth/dental problems, or Sore throat,  No sneezing, itching, ear ache, nasal congestion, post nasal drip, no visual complaints CV: No chest pain currently, + Orthopnea, PND,  1+swelling in lower extremities, -dizziness, -palpitations, syncope.  GI No heartburn, indigestion, abdominal pain, nausea, vomiting, diarrhea, change in bowel habits, loss of appetite, bloody stools.  Resp:+cough, No coughing up of blood. No change in color of mucus. No wheezing.  Skin: no rash or itching or icterus GU: no dysuria, change in color of urine, no urgency or frequency. No flank pain, no hematuria  MS: No joint pain or swelling. No decreased range of motion  Psych: No change in mood or affect. No depression or anxiety.  Neuro: no difficulty with speech, weakness, numbness, ataxia     INTERVAL HISTORY:  Very SOB VITAL SIGNS: Temp:  [98 F (36.7 C)-98.9 F (37.2 C)] 98.6 F (37 C) (11/25 1200) Pulse Rate:  [81-103] 103  (11/25 1500) BP: (109-145)/(48-78) 143/65 mmHg (11/25 1500) SpO2:  [88 %-96 %] 88 % (11/25 1500) FiO2 (%):  [35 %] 35 % (11/25 1500) Weight:  [81.829 kg (180 lb 6.4 oz)] 81.829 kg (180 lb 6.4 oz) (11/25 0500) 35% venturi  HEMODYNAMICS:   VENTILATOR SETTINGS: Vent Mode:  [-]  FiO2 (%):  [35 %] 35 % INTAKE / OUTPUT:  Intake/Output Summary (Last 24 hours) at 04/05/12 1538 Last data filed at 04/05/12 1500  Gross per 24 hour  Intake   1030 ml  Output   2900 ml  Net  -1870 ml    PHYSICAL EXAMINATION: General:  71 year old male, not  In acute distress Neuro:  No  neurologic focal def HEENT:  [place], MMM, + JVD Cardiovascular:  RRR + murmur  Lungs:  Diffuse Crackles, no accessory muscle use  Abdomen:  + bowel sounds No OM  Musculoskeletal:  Intact  Skin:  Intact    LABS: cbc  Lab 04/05/12 0500 04/03/12 0543 04/03/12 0512  WBC 14.7* -- 13.9*  HGB 11.7* 13.9 13.3  HCT 35.3* 41.0 41.2   chemistry  Lab 04/04/12 0927 04/03/12 0735 04/03/12 0543  NA 134* 139 140  K 4.3 3.7 4.3  CO2 26 23 --  GLUCOSE 132* 135* 108*  BUN 18 20 21   CREATININE 1.67*  1.57* 1.60*  CALCIUM 9.3 9.5 --  MG -- 1.9 --  PHOS -- -- --   Liver fxn  Lab 04/03/12 0735 04/03/12 0512  AST 20 20  ALT 14 16  ALKPHOS 72 75  BILITOT 0.3 0.3  PROT 6.6 7.0  ALBUMIN 3.7 3.7   coags  Lab 04/03/12 0527  APTT 32  INR 0.99   Sepsis markers No results found for this basename: LATICACIDVEN:3,PROCALCITON:3 in the last 168 hours Cardiac markers  Lab 04/04/12 2141 04/04/12 1504 04/04/12 0927  CKTOTAL -- -- --  CKMB -- -- --  TROPONINI 14.23* 15.93* >20.00*   BNP No results found for this basename: PROBNP:3 in the last 168 hours ABG  Lab 04/03/12 0543  PHART --  PCO2ART --  PO2ART --  HCO3 --  TCO2 24    CBG trend No results found for this basename: GLUCAP:5 in the last 168 hours  IMAGING: PCXR: bilateral interstitial airspace disease w/ what appears to be RML ATX  ECG:  DIAGNOSES: Principal Problem:  *NSTEMI (non-ST elevated myocardial infarction) Active Problems:  Acute on chronic diastolic CHF (congestive heart failure)   ASSESSMENT / PLAN:  PULMONARY  ASSESSMENT: 1) hypoxic respiratory failure in setting of Pulmonary edema and RML ATX  2) Presumed COPD V/Q scan reassuring: negative for PE. Suspect that this is primarily edema and poor mucous clearance in setting of MI and underlying COPD.   PLAN:   Push diuresis (needs negative fluid balance) Pulmonary hygiene: IS, BDs and flutter F/u CXR Check ABG  CARDIOVASCULAR  ASSESSMENT:   NSTEMI Acute decompensated diastolic dysfxn w/ pulmonary edema  H/o PAF currently NSR  PLAN:  Cont diuresis Cont anticoagulation per cards Cont BB rx and statin    RENAL  ASSESSMENT:   Chronic Renal insuff.  Baseline [place] 1.45 to 1.58 PLAN:   Agree w/ escalation of diuretics  F/u chemistry Renal adjust meds  GASTROINTESTINAL  ASSESSMENT:   No acute issues  PLAN:   Cont PPI   HEMATOLOGIC  ASSESSMENT:   Chronic mild anemia.  No current evidence of bleeding.  PLAN:  Trend CBC Cont heparin   INFECTIOUS  ASSESSMENT:   No acute evidence of infection  PLAN:   Trend CBC Check PCT for completeness  F/u cxr   ENDOCRINE  ASSESSMENT:   No acute issues   PLAN:   Fasting chemistry   NEUROLOGIC  ASSESSMENT:   Acute encephalopathy.  Appears to be getting more confused. ? Driven by hypoxia ? W/d?  PLAN:   Check abg Add thiamine and folate   CLINICAL SUMMARY:  This is a 72 yo male who was admitted on 11/23 w/ NSTEMI. PCCM asked to see for progressive hypoxia. CXR c/w pulmonary edema and ATX. Think this is superimposed on underlying COPD. Don't think this is AE of COPD. At this point would push diuresis and focus on pulmonary hygiene. This is not PE. Suspect on-going hypoxia not helping ischemia picture. He also has some progressive confusion. This could be hypoxia driven but would also question W/D.   I have personally obtained a history, examined the patient, evaluated laboratory and imaging results, formulated the assessment and plan and placed orders.  CRITICAL CARE: The patient is critically ill with multiple organ systems failure and requires high complexity decision making for assessment and support, frequent evaluation and titration of therapies, application of advanced monitoring technologies and extensive interpretation of multiple databases. Critical Care Time devoted to patient care services described in this note is 36  minutes.   Respiratory failure due  to pulmonary edema and increase in sputum production.  I am concerned that respiratory failure is just a matter of time.  I attempted to address code status with the patient but he refused to address it.  Will actively diurese.  I hear to wheezing to suggest an exacerbation of COPD.  The patient is afebrile and expectorated material is all clear and white, nothing to suggest an infection.  WBC is likely a stress response.  If sepsis becomes more evident will start abx.  Alyson Reedy, M.D. [facility] Pulmonary/Critical Care Medicine. Pager: [phone]. After hours pager: [phone].

## 2012-04-05 NOTE — Progress Notes (Signed)
ANTICOAGULATION CONSULT NOTE - Follow Up Consult  Pharmacy Consult for heparin Indication: r/o PE  Labs:  Basename 04/05/12 0028 04/04/12 2141 04/04/12 1810 04/04/12 1504 04/04/12 0927 04/03/12 0735 04/03/12 0543 04/03/12 0527 04/03/12 0512  HGB -- -- -- -- -- -- 13.9 -- 13.3  HCT -- -- -- -- -- -- 41.0 -- 41.2  PLT -- -- -- -- -- -- -- -- 155  APTT -- -- -- -- -- -- -- 32 --  LABPROT -- -- -- -- -- -- -- 13.0 --  INR -- -- -- -- -- -- -- 0.99 --  HEPARINUNFRC 0.28* -- 0.23* -- -- -- -- -- --  CREATININE -- -- -- -- 1.67* 1.57* 1.60* -- --  CKTOTAL -- -- -- -- -- -- -- -- --  CKMB -- -- -- -- -- -- -- -- --  TROPONINI -- 14.23* -- 15.93* >20.00* -- -- -- --    Assessment: 71yo male remains slightly subtherapeutic on heparin after rate increase; awaiting VQ scan to r/o PE.  Goal of Therapy:  Heparin level 0.3-0.7 units/ml   Plan:  Will increase heparin gtt by 1-2 units/kg/hr to 1500 units/hr and check level in 8hr.  Colleen Can PharmD BCPS 04/05/2012,1:17 AM

## 2012-04-05 NOTE — Progress Notes (Signed)
ANTICOAGULATION CONSULT NOTE - Follow Up Consult  Pharmacy Consult for heparin Indication: rule out pulmonary embolism  No Known Allergies  Patient Measurements: Height: 6' 1.62" (187 cm) Weight: 180 lb 6.4 oz (81.829 kg) IBW/kg (Calculated) : 81.33  Heparin Dosing Weight: 84.6 kg  Vital Signs: Temp: 98.9 F (37.2 C) (11/25 0800) Temp src: Oral (11/25 0800) BP: 135/61 mmHg (11/25 1100) Pulse Rate: 89  (11/25 1100)  Labs:  Basename 04/05/12 0933 04/05/12 0500 04/05/12 0028 04/04/12 2141 04/04/12 1810 04/04/12 1504 04/04/12 0927 04/03/12 0735 04/03/12 0543 04/03/12 0527 04/03/12 0512  HGB -- 11.7* -- -- -- -- -- -- 13.9 -- --  HCT -- 35.3* -- -- -- -- -- -- 41.0 -- 41.2  PLT -- 134* -- -- -- -- -- -- -- -- 155  APTT -- -- -- -- -- -- -- -- -- 32 --  LABPROT -- -- -- -- -- -- -- -- -- 13.0 --  INR -- -- -- -- -- -- -- -- -- 0.99 --  HEPARINUNFRC 0.28* -- 0.28* -- 0.23* -- -- -- -- -- --  CREATININE -- -- -- -- -- -- 1.67* 1.57* 1.60* -- --  CKTOTAL -- -- -- -- -- -- -- -- -- -- --  CKMB -- -- -- -- -- -- -- -- -- -- --  TROPONINI -- -- -- 14.23* -- 15.93* >20.00* -- -- -- --    Estimated Creatinine Clearance: 45.3 ml/min (by C-G formula based on Cr of 1.67).  Past Medical History  Diagnosis Date  . Aorto-iliac disease     bilat with stent 2007  . CAD (coronary artery disease)     with loop to the circumflex 2000, inferolateral infarction  . Hypertension   . Paroxysmal atrial flutter     ablation  . ED (erectile dysfunction)   . COPD (chronic obstructive pulmonary disease)   . Hyperlipidemia   . Stroke   . Shortness of breath   . GERD (gastroesophageal reflux disease)   . Gout    Assessment: 71 yo male s/p CABG 6/13 presented with NSTEMI with emergent cath 11/23. He continued to have chest pain, and troponins continued increasing. D-dimer is mildly elevated and he has a high O2 requirement, so there is concern for PE. No anticoagulation prior to  admission.  Heparin level is still slightly subtherapeutic at 0.28.  Hgb 13.9>11.7, platelets 155>134, no reported bleeding.  Goal of Therapy:  Heparin level 0.3-0.7 units/ml Monitor platelets by anticoagulation protocol: Yes   Plan:  1. Increase heparin IV to 1600 units/hr 2. Check daily HL and CBC 3. Follow up VQ scan and length of therapy  Juanita Craver PharmD Candidate 04/05/2012,11:52 AM  I have reviewed the above and agree with the plan  Harland German, Pharm D 04/05/2012 1:19 PM

## 2012-04-05 NOTE — Progress Notes (Signed)
Patient Name: Nathan [name] Date of Encounter: 04/05/2012    SUBJECTIVE: The patient is in the coronary care unit. He has a nonrebreathing mask in place. When he takes it off to speak to me immediately desaturates to 80%. He is having vague chest discomfort. He rates it a 3 of 10 in intensity. He does not subjectively complain of dyspnea/shortness of breath.  TELEMETRY:  Normal sinus rhythm: Filed Vitals:   04/05/12 0800 04/05/12 0900 04/05/12 1000 04/05/12 1100  BP: 139/67 145/65 135/67 135/61  Pulse: 91 95 91 89  Temp: 98.9 F (37.2 C)     TempSrc: Oral     Resp:      Height:      Weight:      SpO2: 92% 92% 95% 94%    Intake/Output Summary (Last 24 hours) at 04/05/12 1207 Last data filed at 04/05/12 1200  Gross per 24 hour  Intake  795.5 ml  Output   2700 ml  Net -1904.5 ml    LABS: Basic Metabolic Panel:  Basename 04/04/12 0927 04/03/12 0735  NA 134* 139  K 4.3 3.7  CL 98 106  CO2 26 23  GLUCOSE 132* 135*  BUN 18 20  CREATININE 1.67* 1.57*  CALCIUM 9.3 9.5  MG -- 1.9  PHOS -- --   CBC:  Basename 04/05/12 0500 04/03/12 0543 04/03/12 0512  WBC 14.7* -- 13.9*  NEUTROABS -- -- 10.3*  HGB 11.7* 13.9 --  HCT 35.3* 41.0 --  MCV 86.9 -- 87.5  PLT 134* -- 155   Cardiac Enzymes:  Basename 04/04/12 2141 04/04/12 1504 04/04/12 0927  CKTOTAL -- -- --  CKMB -- -- --  CKMBINDEX -- -- --  TROPONINI 14.23* 15.93* >20.00*    Basename 04/03/12 0735  HGBA1C 5.4   Fasting Lipid Panel:  Basename 04/04/12 0555  CHOL 140  HDL 25*  LDLCALC 78  TRIG 161*  CHOLHDL 5.6  LDLDIRECT --    Radiology/Studies:  NAD  Physical Exam: Blood pressure 135/61, pulse 89, temperature 98.9 F (37.2 C), temperature source Oral, resp. rate 25, height 6' 1.62" (1.87 m), weight 81.829 kg (180 lb 6.4 oz), SpO2 94.00%. Weight change: -2.771 kg (-6 lb 1.8 oz)   On chest exam, rhonchi are present. No rales are heard.  The cardiac exam reveals an S4 gallop. A soft one of 6  systolic murmur is also heard.  Abdomen is soft.  Extremities reveal no edema.  Neck veins are moderately elevated.  ASSESSMENT:  1. Acute lateral wall myocardial infarction with occlusion of the proximal circumflex which prior to bypass grafting supplies a moderate-sized obtuse marginal branch. The circumflex beyond the obtuse marginal had been chronically totally occluded and was presumably bypassed at the time of surgery. The graft to the circumflex territory is occluded.  2. Respiratory failure, acute on chronic, with components of diastolic heart failure and COPD.  3. Acute on chronic diastolic heart failure  4. Acute on chronic renal insufficiency with creatinine bumped up to 1.7 today. This could be from diuresis or acute kidney injury from contrast that the patient receive this weekend.  Plan:  1. Aggressive diuresis, but be mindful of renal function.  2. Anticoagulation therapy  3. Long-acting nitrates  4. Pulmonary consultation  Signed, Lesleigh Noe 04/05/2012, 12:07 PM

## 2012-04-06 ENCOUNTER — Inpatient Hospital Stay (HOSPITAL_COMMUNITY): Payer: Medicare PPO

## 2012-04-06 DIAGNOSIS — I214 Non-ST elevation (NSTEMI) myocardial infarction: Secondary | ICD-10-CM

## 2012-04-06 DIAGNOSIS — J9600 Acute respiratory failure, unspecified whether with hypoxia or hypercapnia: Secondary | ICD-10-CM | POA: Diagnosis present

## 2012-04-06 LAB — BASIC METABOLIC PANEL
Calcium: 9.4 mg/dL (ref 8.4–10.5)
Chloride: 99 mEq/L (ref 96–112)
Creatinine, Ser: 1.7 mg/dL — ABNORMAL HIGH (ref 0.50–1.35)
GFR calc Af Amer: 44 mL/min — ABNORMAL LOW (ref >90)
Sodium: 137 mEq/L (ref 135–145)

## 2012-04-06 LAB — PROCALCITONIN: Procalcitonin: 0.95 ng/mL

## 2012-04-06 LAB — PRO B NATRIURETIC PEPTIDE: Pro B Natriuretic peptide (BNP): 16941 pg/mL — ABNORMAL HIGH (ref 0–125)

## 2012-04-06 LAB — CBC
MCH: 28.4 pg (ref 26.0–34.0)
Platelets: 135 10*3/uL — ABNORMAL LOW (ref 150–400)
RBC: 4.33 MIL/uL (ref 4.22–5.81)

## 2012-04-06 LAB — HEPARIN LEVEL (UNFRACTIONATED): Heparin Unfractionated: 0.19 IU/mL — ABNORMAL LOW (ref 0.30–0.70)

## 2012-04-06 MED ORDER — VITAMIN B-1 100 MG PO TABS
100.0000 mg | ORAL_TABLET | Freq: Every day | ORAL | Status: DC
  Administered 2012-04-06 – 2012-04-07 (×2): 100 mg via ORAL
  Filled 2012-04-06 (×3): qty 1

## 2012-04-06 MED ORDER — FUROSEMIDE 10 MG/ML IJ SOLN
60.0000 mg | Freq: Two times a day (BID) | INTRAMUSCULAR | Status: DC
  Administered 2012-04-06 (×2): 60 mg via INTRAVENOUS
  Filled 2012-04-06 (×4): qty 6

## 2012-04-06 MED ORDER — FUROSEMIDE 10 MG/ML IJ SOLN: 40.0000 mg | Freq: Two times a day (BID) | INTRAMUSCULAR | Status: DC

## 2012-04-06 NOTE — Progress Notes (Signed)
Received order however pt on ventimask and apparently has not been able to do any activity without decreasing SaO2. Will f/u tomorrow. Would like pt to be able to move to chair before ambulating.  Ethelda Chick CES, ACSM

## 2012-04-06 NOTE — Progress Notes (Signed)
Pt's family brought in [place] for pt for lunch. This RN reminded pt this goes against the heart healthy diet order from the physician and explained that the high sodium content of this meal would be detrimental to pt's progress as we are tying to diurese at this time to ease his breathing. Pt became upset and stated "well you're not my doctor, you're just a nurse." This RN explained to pt the importance of a low sodium diet due to his post NSTEMI, heart failure, and tenuous respiratory status. Pt remains on venturi mask at 50% SaO2 low 90's. This RN informed pt that he may do as he wishes but consuming food with very high sodium content would go against medical advice.

## 2012-04-06 NOTE — Progress Notes (Signed)
PULMONARY  / CRITICAL CARE MEDICINE  Name: Nathan [name] MRN: [id number] DOB: 11/15/1940    LOS: 3  REFERRING MD :  Katrinka Blazing   CHIEF COMPLAINT:  Acute hypoxic respiratory failure   BRIEF PATIENT DESCRIPTION:  71 year old male admitted on 11/23 for NSTEMI. PCCM asked to see on 11/25 for persistent hypoxia (sats as low as 70s on room air) in spite of diuretics, V/Q scan was negative.   LINES / TUBES:  CULTURES: mrsa 11/23: negative   ANTIBIOTICS:  SIGNIFICANT EVENTS:  ECHO 11/23: EF 50-55%, mild to moderate MR V/q scan 11/25: no evidence of PE (low prob)  LEVEL OF CARE:  ICU PRIMARY SERVICE:  [facility]  CONSULTANTS:  pulm CODE STATUS: full DIET:  Heart healthy DVT Px:  Heparin  GI Px:  PPI  HISTORY OF PRESENT ILLNESS:   71 y/o male with a history of CAD and tobacco abuse presented on 11/23 for chest pain evaluation. The am of 11/23  he work up from sleep with 8/10 chest pain that he described as tightness, non-radiating but associated with shortness of breath and nausea. Dx eval was c/w acute MI. He was admitted to the cardiology service. Treatment included emergent Left heart cath that showed occluded SVG to OM. Since admit he has had persistent hypoxia and rising trop I. Has had cough with thick white sputum, no fever, + SOB at rest. His LVEDP was 26 mmHg. He had a V/Q scan on 11/25 that was low-prob. PCCM asked to see on 11/15 for hypoxic respiratory failure.    INTERVAL HISTORY:  Comfortable, reading the paper Some intermittent forgetfulness desats on 6L when he falls asleep  VITAL SIGNS: Temp:  [97.8 F (36.6 C)-100.5 F (38.1 C)] 97.8 F (36.6 C) (11/26 0800) Pulse Rate:  [86-103] 88  (11/26 0800) Resp:  [21-24] 21  (11/26 0800) BP: (113-145)/(42-70) 133/70 mmHg (11/26 0800) SpO2:  [90 %-96 %] 93 % (11/26 0800) FiO2 (%):  [35 %] 35 % (11/25 1900) Weight:  [81.3 kg (179 lb 3.7 oz)] 81.3 kg (179 lb 3.7 oz) (11/26 0500) 6L/min  HEMODYNAMICS:   VENTILATOR  SETTINGS: Vent Mode:  [-]  FiO2 (%):  [35 %] 35 % INTAKE / OUTPUT:  Intake/Output Summary (Last 24 hours) at 04/06/12 0853 Last data filed at 04/06/12 0845  Gross per 24 hour  Intake 1146.65 ml  Output   3275 ml  Net -2128.35 ml    PHYSICAL EXAMINATION: General:  71 year old male, not  In acute distress Neuro:  No neurologic focal def HEENT:  Nathan [name], MMM, + JVD Cardiovascular:  RRR + murmur  Lungs:  Diffuse Crackles, no accessory muscle use  Abdomen:  + bowel sounds No OM  Musculoskeletal:  Intact  Skin:  Intact    LABS: cbc  Lab 04/06/12 0540 04/05/12 0500 04/03/12 0543 04/03/12 0512  WBC 15.7* 14.7* -- 13.9*  HGB 12.3* 11.7* 13.9 --  HCT 37.5* 35.3* 41.0 --   chemistry  Lab 04/06/12 0540 04/04/12 0927 04/03/12 0735  NA 137 134* 139  K 3.6 4.3 3.7  CO2 23 26 23   GLUCOSE 111* 132* 135*  BUN 32* 18 20  CREATININE 1.70* 1.67* 1.57*  CALCIUM 9.4 9.3 9.5  MG -- -- 1.9  PHOS -- -- --   Liver fxn  Lab 04/03/12 0735 04/03/12 0512  AST 20 20  ALT 14 16  ALKPHOS 72 75  BILITOT 0.3 0.3  PROT 6.6 7.0  ALBUMIN 3.7 3.7   coags  Lab 04/03/12 0527  APTT 32  INR 0.99   Sepsis markers  Lab 04/05/12 1645  LATICACIDVEN --  PROCALCITON 0.78   Cardiac markers  Lab 04/04/12 2141 04/04/12 1504 04/04/12 0927  CKTOTAL -- -- --  CKMB -- -- --  TROPONINI 14.23* 15.93* >20.00*   BNP  Lab 04/06/12 0540  PROBNP 16941.0*   ABG  Lab 04/05/12 1537 04/03/12 0543  PHART 7.438 --  PCO2ART 33.9* --  PO2ART 65.0* --  HCO3 22.8 --  TCO2 24 24    CBG trend No results found for this basename: GLUCAP:5 in the last 168 hours  IMAGING: PCXR: bilateral interstitial airspace disease w/ what appears to be RML ATX, little change from 11/25  ECG:  DIAGNOSES: Principal Problem:  *NSTEMI (non-ST elevated myocardial infarction) Active Problems:  Acute on chronic diastolic CHF (congestive heart failure)  Acute respiratory failure  Pulmonary edema  Altered mental  status   ASSESSMENT / PLAN:  PULMONARY  ASSESSMENT: 1) hypoxic respiratory failure in setting of Pulmonary edema and RML ATX  2) Presumed COPD V/Q scan reassuring: negative for PE. Suspect that this is primarily edema and poor mucous clearance in setting of MI and underlying COPD.   PLAN:   Push diuresis (needs negative fluid balance) as S Cr will permit Pulmonary hygiene: IS, BDs and flutter F/u CXR 11/27  CARDIOVASCULAR  ASSESSMENT:  CAD >> NSTEMI >> L heart cath w multiple vein grafts occluded Acute decompensated diastolic dysfxn w/ pulmonary edema  H/o PAF currently NSR  PLAN:  Cont diuresis Cont anticoagulation per cards Cont BB rx and statin    RENAL  ASSESSMENT:   Chronic Renal insuff.  Baseline  1.45 to 1.58, stable now at 1.7 PLAN:   Agree w/ escalation of diuretics  F/u chemistry and S Cr Renal adjust meds  GASTROINTESTINAL  ASSESSMENT:   No acute issues  PLAN:   Cont PPI   HEMATOLOGIC  ASSESSMENT:   Chronic mild anemia.  No current evidence of bleeding.  PLAN:  Trend CBC Cont heparin   INFECTIOUS  ASSESSMENT:   No acute evidence of infection, Pct indeterminant PLAN:   Trend CBC and Pct, clinical status  ENDOCRINE  ASSESSMENT:   No acute issues  PLAN:     NEUROLOGIC  ASSESSMENT:   Acute encephalopathy >> continues to have some intermittent confusion Appears to be getting more confused. ? Driven by hypoxia ? W/d?  PLAN:   Thiamine and folate  Will find out his baseline from family  CLINICAL SUMMARY:  This is a 71 yo male who was admitted on 11/23 w/ NSTEMI. PCCM asked to see for progressive hypoxia. CXR c/w pulmonary edema and ATX. Think this is superimposed on underlying COPD. Don't think this is AE of COPD. At this point would push diuresis and focus on pulmonary hygiene. This is not PE. Suspect on-going hypoxia not helping ischemia picture. He also has some progressive confusion. This could be hypoxia driven but would also  question W/D.   I have personally obtained a history, examined the patient, evaluated laboratory and imaging results, formulated the assessment and plan and placed orders.   Levy Pupa, MD, PhD 04/06/2012, 9:05 AM [facility] [phone] or if no answer [phone]

## 2012-04-06 NOTE — Progress Notes (Signed)
ANTICOAGULATION CONSULT NOTE - Follow Up Consult  Pharmacy Consult for heparin Indication: NSTEMI  Labs:  Basename 04/06/12 0540 04/05/12 0933 04/05/12 0500 04/05/12 0028 04/04/12 2141 04/04/12 1504 04/04/12 0927 04/03/12 0735  HGB 12.3* -- 11.7* -- -- -- -- --  HCT 37.5* -- 35.3* -- -- -- -- --  PLT 135* -- 134* -- -- -- -- --  APTT -- -- -- -- -- -- -- --  LABPROT -- -- -- -- -- -- -- --  INR -- -- -- -- -- -- -- --  HEPARINUNFRC 0.19* 0.28* -- 0.28* -- -- -- --  CREATININE -- -- -- -- -- -- 1.67* 1.57*  CKTOTAL -- -- -- -- -- -- -- --  CKMB -- -- -- -- -- -- -- --  TROPONINI -- -- -- -- 14.23* 15.93* >20.00* --    Assessment: 71yo male remains subtherapeutic on heparin with reduced level despite increased rate and no infusion issues per RN.  Goal of Therapy:  Heparin level 0.3-0.7 units/ml   Plan:  Will increase heparin gtt by 4 units/kg/hr to 1900 units/hr and check level in 6-8hr.  Colleen Can PharmD BCPS 04/06/2012,7:03 AM

## 2012-04-06 NOTE — Progress Notes (Signed)
Pt c/o 4/10 chest pain, non-radiating. NTG offered but pt refused stating "the percocet will do ok." [facility] paged and Dr Mayford Knife notified. No EKG changes noted. Pt resting in bed with wife at bedside.

## 2012-04-06 NOTE — Progress Notes (Signed)
Dr Katrinka Blazing rounding on Pt. This RN updated MD on pt status including SOB with activity. Plan to increase activity as able with physical therapy.

## 2012-04-06 NOTE — Progress Notes (Addendum)
Patient Name: Nathan [name] Date of Encounter: 04/06/2012    SUBJECTIVE: The patient looks better this morning. There is no dyspnea and he is able to carry on a conversation. The Ventimask has been removed. He no longer complains of chest discomfort.  TELEMETRY:  Normal sinus rhythm: Filed Vitals:   04/06/12 0500 04/06/12 0600 04/06/12 0700 04/06/12 0800  BP: 135/65 138/61 135/69 133/70  Pulse: 89 88 88 88  Temp:    97.8 F (36.6 C)  TempSrc:    Oral  Resp:    21  Height:      Weight: 81.3 kg (179 lb 3.7 oz)     SpO2: 96% 90% 90% 93%    Intake/Output Summary (Last 24 hours) at 04/06/12 0916 Last data filed at 04/06/12 0900  Gross per 24 hour  Intake 1117.65 ml  Output   3275 ml  Net -2157.35 ml    LABS: Basic Metabolic Panel:  Basename 04/06/12 0540 04/04/12 0927  NA 137 134*  K 3.6 4.3  CL 99 98  CO2 23 26  GLUCOSE 111* 132*  BUN 32* 18  CREATININE 1.70* 1.67*  CALCIUM 9.4 9.3  MG -- --  PHOS -- --   CBC:  Basename 04/06/12 0540 04/05/12 0500  WBC 15.7* 14.7*  NEUTROABS -- --  HGB 12.3* 11.7*  HCT 37.5* 35.3*  MCV 86.6 86.9  PLT 135* 134*   Cardiac Enzymes:  Basename 04/04/12 2141 04/04/12 1504 04/04/12 0927  CKTOTAL -- -- --  CKMB -- -- --  CKMBINDEX -- -- --  TROPONINI 14.23* 15.93* >20.00*   BNP: No components found with this basename: POCBNP:3 Hemoglobin A1C: No results found for this basename: HGBA1C in the last 72 hours Fasting Lipid Panel:  Basename 04/04/12 0555  CHOL 140  HDL 25*  LDLCALC 78  TRIG 454*  CHOLHDL 5.6  LDLDIRECT --    Radiology/Studies:  CHF with midlung opacities  Physical Exam: Blood pressure 133/70, pulse 88, temperature 97.8 F (36.6 C), temperature source Oral, resp. rate 21, height 6' 1.62" (1.87 m), weight 81.3 kg (179 lb 3.7 oz), SpO2 93.00%. Weight change: -0.529 kg (-1 lb 2.7 oz)   Rhonchi heard throughout both lung fields.  Cardiac exam reveals an S4 gallop but no rub.  Neck veins are  moderately distended  Extremities reveal no edema  ASSESSMENT:  1. Acute on chronic diastolic heart failure secondary to acute lateral wall non-ST elevation myocardial infarction. Heart these improving with diuresis.  2. Non-ST elevation myocardial infarction due to occlusion of the obtuse marginal #1 which was not grafted at the time of bypass surgery.  3. Chronic respiratory failure with acute exacerbation secondary to diastolic heart failure  4. 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  5. Acute kidney injury due to MI, hypoxia, contrast, and diuresis, stable.  Plan:  1. Aggressive diuresis  2. Ambulate with phase I cardiac rehabilitation  3. Possible discharge In 24-36 hours.  Nathan [name] 04/06/2012, 9:16 AM

## 2012-04-07 ENCOUNTER — Inpatient Hospital Stay (HOSPITAL_COMMUNITY): Payer: Medicare PPO

## 2012-04-07 ENCOUNTER — Ambulatory Visit (HOSPITAL_COMMUNITY): Payer: Medicare PPO

## 2012-04-07 ENCOUNTER — Encounter (HOSPITAL_COMMUNITY): Payer: Medicare PPO

## 2012-04-07 DIAGNOSIS — R0902 Hypoxemia: Secondary | ICD-10-CM | POA: Diagnosis not present

## 2012-04-07 DIAGNOSIS — N179 Acute kidney failure, unspecified: Secondary | ICD-10-CM

## 2012-04-07 LAB — BASIC METABOLIC PANEL
BUN: 39 mg/dL — ABNORMAL HIGH (ref 6–23)
Calcium: 9.1 mg/dL (ref 8.4–10.5)
Creatinine, Ser: 1.77 mg/dL — ABNORMAL HIGH (ref 0.50–1.35)
GFR calc Af Amer: 42 mL/min — ABNORMAL LOW (ref >90)
GFR calc non Af Amer: 36 mL/min — ABNORMAL LOW (ref >90)

## 2012-04-07 LAB — CBC
MCH: 28.9 pg (ref 26.0–34.0)
MCHC: 33.4 g/dL (ref 30.0–36.0)
Platelets: 125 10*3/uL — ABNORMAL LOW (ref 150–400)

## 2012-04-07 LAB — MAGNESIUM: Magnesium: 2.2 mg/dL (ref 1.5–2.5)

## 2012-04-07 MED ORDER — FUROSEMIDE 40 MG PO TABS: 40.0000 mg | ORAL_TABLET | Freq: Every day | ORAL | Status: DC

## 2012-04-07 MED ORDER — FUROSEMIDE 40 MG PO TABS
40.0000 mg | ORAL_TABLET | Freq: Two times a day (BID) | ORAL | Status: DC
  Administered 2012-04-07 – 2012-04-08 (×2): 40 mg via ORAL
  Filled 2012-04-07 (×5): qty 1

## 2012-04-07 MED ORDER — METOPROLOL TARTRATE 25 MG PO TABS
25.0000 mg | ORAL_TABLET | Freq: Two times a day (BID) | ORAL | Status: DC
  Administered 2012-04-07 (×2): 25 mg via ORAL
  Filled 2012-04-07 (×3): qty 1

## 2012-04-07 MED ORDER — POTASSIUM CHLORIDE CRYS ER 20 MEQ PO TBCR
40.0000 meq | EXTENDED_RELEASE_TABLET | Freq: Once | ORAL | Status: AC
  Administered 2012-04-07: 40 meq via ORAL
  Filled 2012-04-07: qty 2

## 2012-04-07 MED ORDER — TIOTROPIUM BROMIDE MONOHYDRATE 18 MCG IN CAPS
18.0000 ug | ORAL_CAPSULE | Freq: Every day | RESPIRATORY_TRACT | Status: DC
  Administered 2012-04-07 – 2012-04-08 (×2): 18 ug via RESPIRATORY_TRACT
  Filled 2012-04-07: qty 5

## 2012-04-07 MED ORDER — NITROGLYCERIN 0.4 MG SL SUBL: 0.4000 mg | SUBLINGUAL_TABLET | SUBLINGUAL | Status: DC | PRN

## 2012-04-07 MED ORDER — POTASSIUM CHLORIDE CRYS ER 20 MEQ PO TBCR: 20.0000 meq | EXTENDED_RELEASE_TABLET | Freq: Every day | ORAL | Status: DC

## 2012-04-07 MED ORDER — OMEPRAZOLE 20 MG PO CPDR: 20.0000 mg | DELAYED_RELEASE_CAPSULE | Freq: Every day | ORAL | Status: DC

## 2012-04-07 MED ORDER — POTASSIUM CHLORIDE CRYS ER 20 MEQ PO TBCR
20.0000 meq | EXTENDED_RELEASE_TABLET | Freq: Every day | ORAL | Status: DC
  Administered 2012-04-07: 20 meq via ORAL
  Filled 2012-04-07 (×2): qty 1

## 2012-04-07 MED ORDER — POTASSIUM CHLORIDE CRYS ER 20 MEQ PO TBCR: 20.0000 meq | EXTENDED_RELEASE_TABLET | Freq: Two times a day (BID) | ORAL | Status: DC

## 2012-04-07 NOTE — Evaluation (Signed)
Physical Therapy Evaluation Patient Details Name: Nathan [name] MRN: [id number] DOB: 18-Oct-1940 Today's Date: 04/07/2012 Time: 0831-0905 PT Time Calculation (min): 34 min  PT Assessment / Plan / Recommendation Clinical Impression  Pleasant 71 yr old admittted secondary to acute MI and hopoxic encephalopathy.  Presents at an overall supervision level with simulated selfcare tasks and ADLS.  Will have intermittent supervision from his wife at discharge. Receommend pt be up ambulating with nursing today with f/u HHPT for balance.     PT Assessment  Patient needs continued PT services    Follow Up Recommendations  Home health PT;Supervision - Intermittent    Does the patient have the potential to tolerate intense rehabilitation      Barriers to Discharge   wife provides intermittent supervision    Equipment Recommendations  None recommended by PT    Recommendations for Other Services     Frequency Min 3X/week    Precautions / Restrictions Precautions Precautions: Fall Restrictions Weight Bearing Restrictions: No   Pertinent Vitals/Pain SaO2 >/=91% during session on RA      Mobility  Bed Mobility Bed Mobility: Not assessed Transfers Transfers: Stand to Sit Sit to Stand: 6: Modified independent (Device/Increase time);Without upper extremity assist;From toilet Stand to Sit: 6: Modified independent (Device/Increase time);With armrests;With upper extremity assist;To chair/3-in-1 Ambulation/Gait Ambulation/Gait Assistance: 5: Supervision;4: Min guard Ambulation Distance (Feet): 300 Feet Assistive device: None Ambulation/Gait Assistance Details: supervision/mingaurdA for instability especially with more dynamic challenges but no physical assist/corrections needed Gait Pattern: Narrow base of support;Decreased stride length General Gait Details: slight lateral instability but no overt LOB noted, needs to slow down for completion of more challenging activities            PT Diagnosis: Abnormality of gait;Generalized weakness  PT Problem List: Decreased balance;Decreased activity tolerance PT Treatment Interventions: Gait training;Functional mobility training;Therapeutic activities;Therapeutic exercise;Balance training;Stair training;Patient/family education   PT Goals Acute Rehab PT Goals PT Goal Formulation: With patient Time For Goal Achievement: 04/14/12 Pt will Ambulate: >150 feet;Independently;with gait velocity >(comment) ft/second (2.62 ft/sec) PT Goal: Ambulate - Progress: Goal set today Pt will Go Up / Down Stairs: 1-2 stairs;with modified independence;with rail(s) PT Goal: Up/Down Stairs - Progress: Goal set today Additional Goals Additional Goal #1: Pt will demonstrate decreased risk of falls with DGI >/=20/24.  PT Goal: Additional Goal #1 - Progress: Goal set today  Visit Information  Last PT Received On: 04/07/12 Assistance Needed: +1 PT/OT Co-Evaluation/Treatment: Yes    Subjective Data  Subjective: My goals are the same as hers. (pt referring to wife and how he doesn't intend on following medical recommendations) Patient Stated Goal: home today   Prior Functioning  Home Living Lives With: Spouse Available Help at Discharge: Available PRN/intermittently;Other (Comment) (wife works intemittently during the day.) Type of Home: Mobile home Home Access: Stairs to enter [employer] of Steps: 5 Entrance Stairs-Rails: Can reach both Bathroom Shower/Tub: Tub/shower unit;Curtain Firefighter: Standard Home Adaptive Equipment: Walker - rolling;Bedside commode/3-in-1;Shower chair without back;Other (comment) (bedside table) Prior Function Level of Independence: Independent Able to Take Stairs?: Yes Driving: Yes Vocation: Retired Musician: No difficulties Dominant Hand: Right    Cognition  Overall Cognitive Status: Impaired Area of Impairment: Memory;Safety/judgement Orientation Level: Disoriented  X4;Time Behavior During Session: [facility] for tasks performed Memory Deficits: able to recall 2/3 words given to him by OT after approx 5 minutes    Extremity/Trunk Assessment Right Upper Extremity Assessment RUE ROM/Strength/Tone: Within functional levels RUE Sensation: WFL - Light Touch RUE Coordination:  WFL - gross/fine motor Left Upper Extremity Assessment LUE ROM/Strength/Tone: Within functional levels LUE Sensation: WFL - Light Touch LUE Coordination: WFL - gross/fine motor Right Lower Extremity Assessment RLE ROM/Strength/Tone: WFL for tasks assessed Left Lower Extremity Assessment LLE ROM/Strength/Tone: WFL for tasks assessed Trunk Assessment Trunk Assessment: Normal   Balance Balance Balance Assessed: Yes Static Standing Balance Static Standing - Balance Support: No upper extremity supported Static Standing - Comment/# of Minutes: pt able to perform partial romberg (heels together and toes turned out with supervision); able to take small step forward for partial tandem stance with no difficulty Single Leg Stance - Right Leg: 2  (with RUE support pt able to hold 30 seconds) Dynamic Standing Balance Dynamic Standing - Balance Support: No upper extremity supported Dynamic Standing - Level of Assistance: 5: Stand by assistance High Level Balance High Level Balance Activites: Backward walking;Sudden stops;Head turns;Direction changes;Turns High Level Balance Comments: Pt with occasional stagger but able to selfcorrect; able to step over trashcan without upper extremity support x4, with practice pt with improved technique and less instability; slows down to complete more difficult challenges such as gait pivot turn  End of Session PT - End of Session Equipment Utilized During Treatment: Gait belt Activity Tolerance: Patient tolerated treatment well Patient left: in chair;with call bell/phone within reach Nurse Communication: Mobility status  GP     [facility] HELEN 04/07/2012,  10:09 AM

## 2012-04-07 NOTE — Progress Notes (Signed)
eLink Physician-Brief Progress Note Patient Name: Nathan [name] DOB: 1940-10-01 MRN: [id number]  Date of Service  04/07/2012   HPI/Events of Note   Hypokalemia  eICU Interventions  Potassium replaced   Intervention Category Minor Interventions: Electrolytes abnormality - evaluation and management  Verlene Glantz 04/07/2012, 5:32 AM

## 2012-04-07 NOTE — Care Management Note (Addendum)
    Page 1 of 2   04/07/2012     3:08:53 PM   CARE MANAGEMENT NOTE 04/07/2012  Patient:  Nathan [name], Nathan [name]   Account Number:  [id number]  Date Initiated:  04/05/2012  Documentation initiated by:  Junius Creamer  Subjective/Objective Assessment:   adm w htn, mi     Action/Plan:   lives w wife, pcp dr j griffin   Anticipated DC Date:     Anticipated DC Plan:  HOME W HOME HEALTH SERVICES      DC Planning Services  CM consult      [facility] Choice  HOME HEALTH  DURABLE MEDICAL EQUIPMENT   Choice offered to / List presented to:  C-1 Patient   DME arranged  [facility]      DME agency  [facility]     HH arranged  HH-2 PT      HH agency  [facility]   Status of service:   Medicare Important Message given?   (If response is "NO", the following Medicare IM given date fields will be blank) Date Medicare IM given:   Date Additional Medicare IM given:    Discharge Disposition:  HOME W HOME HEALTH SERVICES  Per UR Regulation:  Reviewed for med. necessity/level of care/duration of stay  If discussed at Long Length of Stay Meetings, dates discussed:    Comments:  11/27 13:30p debbie Breken Nazari rn,bsn pt has order for home o2. [employer] uses [facility] for dme. have faxed order to [facility] for home o2 at 2lit -for nocturnal o2 they do not del port to hosp.Marland Kitchen Christoper Allegra [phone], fax [phone]  11/27 11am debbie Ajeenah Heiny rn,bsn spoke w pt and gave him hhc agency list. he has used [facility] in past and would like them again for hhpt. phy ther had seen pt and rec hhpt. have left sticky note for md to put in hhc orders if he agrees.  11/25 9:26a debbie Gable Odonohue rn,bsn [phone]

## 2012-04-07 NOTE — Evaluation (Signed)
Occupational Therapy Evaluation Patient Details Name: SAIQUAN [name] MRN: [id number] DOB: 1941/01/04 Today's Date: 04/07/2012 Time: 0821-0900 OT Time Calculation (min): 39 min  OT Assessment / Plan / Recommendation Clinical Impression  Pleasant 71 yr old admittted secondary to acute MI and hopoxic encephalopathy.  Presents at an overall supervision level with simulated selfcare tasks and ADLS.  Will have intermittent supervision from his wife at discharge but feel he will likely reach modified independent status for discharge home in a day or so with increased mobility.  No further OT needs or DME needs.    OT Assessment  Patient does not need any further OT services    Follow Up Recommendations  No OT follow up       Equipment Recommendations  None recommended by OT          Precautions / Restrictions Precautions Precautions: Fall Restrictions Weight Bearing Restrictions: No   Pertinent Vitals/Pain O2 sats 96% on 2Ls nasal cannula decreased to 89-92 with activity on room air, no report of pain    ADL  Eating/Feeding: Performed;Independent Where Assessed - Eating/Feeding: Chair Grooming: Simulated;Supervision/safety Where Assessed - Grooming: Unsupported standing Upper Body Bathing: Simulated;Supervision/safety Where Assessed - Upper Body Bathing: Unsupported standing Lower Body Bathing: Simulated;Supervision/safety Where Assessed - Lower Body Bathing: Unsupported standing Upper Body Dressing: Simulated;Set up Where Assessed - Upper Body Dressing: Unsupported sitting Lower Body Dressing: Simulated;Set up Where Assessed - Lower Body Dressing: Unsupported sit to stand Toilet Transfer: Simulated;Supervision/safety Toilet Transfer Method: Other (comment) (ambulate without assistive device) Toilet Transfer Equipment: Regular height toilet Toileting - Clothing Manipulation and Hygiene: Simulated;Supervision/safety Where Assessed - Toileting Clothing Manipulation and  Hygiene: Sit to stand from 3-in-1 or toilet Tub/Shower Transfer: Simulated;Supervision/safety Tub/Shower Transfer Method: Science writer: Other (comment) (none) Equipment Used: Gait belt Transfers/Ambulation Related to ADLs: Pt is overall supervision for mobility without use of assistive device. ADL Comments: Pt is setup/supervision for simulated selfcare tasks.  Exhibits some slight dynamic balance issues with occassional staggering with turns and inability to stand on one leg.  Able to selfcorrect all LOB without assitance however with stepping strategy.  Feel if he is up and moving around more the next day or so he will be OK at home alone while his wife is at work. Discussed use of shower seat for endurance purposes but wife reports that he will not use it anyway.      Visit Information  Last OT Received On: 04/07/12 Assistance Needed: +1 PT/OT Co-Evaluation/Treatment: Yes    Subjective Data  Subjective: "Is it September?" Patient Stated Goal: To go home as soon as possible.   Prior Functioning     Home Living Lives With: Spouse Available Help at Discharge: Available PRN/intermittently;Other (Comment) (wife works intemittently during the day.) Type of Home: Mobile home Home Access: Stairs to enter [employer] of Steps: 5 Entrance Stairs-Rails: Can reach both Bathroom Shower/Tub: Tub/shower unit;C[employer]ain Firefighter: Standard Home Adaptive Equipment: Walker - rolling;Bedside commode/3-in-1;Shower chair without back;Other (comment) (bedside table) Prior Function Level of Independence: Independent Able to Take Stairs?: Yes Driving: Yes Vocation: Retired Musician: No difficulties Dominant Hand: Right         Vision/Perception Vision - Assessment Eye Alignment: Within Functional Limits Vision Assessment: Vision not tested Perception Perception: Within Functional Limits Praxis Praxis: Intact   Cognition   Overall Cognitive Status: Impaired Area of Impairment: Memory;Safety/judgement Orientation Level: Disoriented X4;Time Behavior During Session: [facility] for tasks performed Memory Deficits: able to recall 2/3 words given to him  by OT after approx 5 minutes    Extremity/Trunk Assessment Right Upper Extremity Assessment RUE ROM/Strength/Tone: Within functional levels RUE Sensation: WFL - Light Touch RUE Coordination: WFL - gross/fine motor Left Upper Extremity Assessment LUE ROM/Strength/Tone: Within functional levels LUE Sensation: WFL - Light Touch LUE Coordination: WFL - gross/fine motor Trunk Assessment Trunk Assessment: Normal     Mobility Transfers Transfers: Sit to Stand Sit to Stand: 6: Modified independent (Device/Increase time);Without upper extremity assist;From toilet           Balance Balance Balance Assessed: Yes Dynamic Standing Balance Dynamic Standing - Balance Support: No upper extremity supported Dynamic Standing - Level of Assistance: 5: Stand by assistance High Level Balance High Level Balance Activites: Backward walking;Sudden stops;Head turns High Level Balance Comments: Pt with occasional stagger but able to selfcorrect.   End of Session OT - End of Session Equipment Utilized During Treatment: Gait belt Activity Tolerance: Patient tolerated treatment well Patient left: in chair;with call bell/phone within reach;with family/visitor present Nurse Communication: Mobility status     Aston Lieske OTR/L Pager number [phone] 04/07/2012, 9:45 AM

## 2012-04-07 NOTE — Progress Notes (Signed)
Patient ID: JOSEANDRES EMBURY, male   DOB: 1940/10/24, 71 y.o.   MRN: [id number] Patient Name: Nathan [name] Date of Encounter: 04/07/2012    SUBJECTIVE: I have spent considerable time today speaking with the patient and his wife. The patient desperately wants to go home however he developed significant hypoxia with asleep. O2 saturations remained above 90% while ambulating. He's still coughing and unable to clear secretions. He denies angina. His wife is concerned about his continued smoking and his diet. He is asking for an [employer] sub sandwich.  TELEMETRY:   Normal sinus rhythm: Filed Vitals:   04/07/12 0914 04/07/12 0916 04/07/12 1000 04/07/12 1200  BP:   132/64 127/64  Pulse:   86 79  Temp:    98.4 F (36.9 C)  TempSrc:    Oral  Resp:    16  Height:      Weight:      SpO2: 97% 96% 91% 94%    Intake/Output Summary (Last 24 hours) at 04/07/12 1316 Last data filed at 04/07/12 1200  Gross per 24 hour  Intake   1346 ml  Output   1425 ml  Net    -79 ml    LABS: Basic Metabolic Panel:  Basename 04/07/12 0425 04/06/12 0540  NA 135 137  K 3.4* 3.6  CL 97 99  CO2 26 23  GLUCOSE 124* 111*  BUN 39* 32*  CREATININE 1.77* 1.70*  CALCIUM 9.1 9.4  MG 2.2 --  PHOS 3.7 --   CBC:  Basename 04/07/12 0425 04/06/12 0540  WBC 12.1* 15.7*  NEUTROABS -- --  HGB 11.2* 12.3*  HCT 33.5* 37.5*  MCV 86.3 86.6  PLT 125* 135*   Cardiac Enzymes:  Basename 04/04/12 2141 04/04/12 1504  CKTOTAL -- --  CKMB -- --  CKMBINDEX -- --  TROPONINI 14.23* 15.93*    Radiology/Studies:  04/07/12 IMPRESSION:  1. Indistinct residual interstitial and airspace opacities may  reflect residual edema or atypical pneumonia.  Original Report Authenticated By: Gaylyn Rong, M.D.   Physical Exam: Blood pressure 127/64, pulse 79, temperature 98.4 F (36.9 C), temperature source Oral, resp. rate 16, height 6' 1.62" (1.87 m), weight 80.9 kg (178 lb 5.6 oz), SpO2 94.00%. Weight change: -0.4 kg  (-14.1 oz)   [place], coarse rhonchi throughout the lung fields anteriorly and posteriorly.  S4 gallop. No rub. No murmur.  No peripheral edema.  Neck exam reveals moderate JVD  ASSESSMENT:  1. Acute on chronic diastolic heart failure slowly improving  2. Acute on chronic respiratory failure, significant desaturation while sleeping. Appreciate pulmonary input  3. Recent non-ST elevation myocardial infarction due to occlusion of the circumflex native territory. Also demonstrated to have bypass graft failure.  4. Acute on chronic kidney disease, aggravated by diuresis some hypoxia  Plan:  1. I had initially intended to discharge the patient today, however have reconsidered and we'll keep him at least one more day for further diuresis, arrangement of oxygen therapy, and for one more day of physical training.  2. We need to determine the discharge dose of diuretic. He was on no diuretic therapy prior to admission. We used furosemide therapy post bypass surgery but he became significantly dehydrated on as little as 20 mg per day.  [name] 04/07/2012, 1:16 PM

## 2012-04-07 NOTE — Progress Notes (Signed)
CARDIAC REHAB PHASE I   PRE:  Rate/Rhythm: 77 SR  BP:  Supine:   Sitting: 131/57  Standing:    SaO2: 99 2L  MODE:  Ambulation: 350 ft   POST:  Rate/Rhythem: 85 SR  BP:  Supine:   Sitting: 145/57  Standing:    SaO2: during walk 92-94% RA  End of walk 85% RA  increased to 97 on 2L 1340-1440 On arrival pt on O2 2L sat 99%. Assisted X 1 to ambulate. Gait steady. Pt is DOE. The  more he walked the more SOB he got. By end of work RA sat 85%. O2 reapplied 2L sat improved to 97%. Completed MI education with pt and wife. Pt is not interested in smoking cessation. I did give him tips for quitting and coaching contact number.He is not interested in Outpt. CRP. It is difficult to know if pt is serious about anything, he makes a joke out of everything I said. I doubt compliance with smoking cessation,wearing O2 and exercise.  Beatrix Fetters

## 2012-04-07 NOTE — Progress Notes (Addendum)
Pt asleep in chair on room air. Desat to 82% while asleep for 7 minutes. Placed back on 2L/[place]. Will continue to monitor.

## 2012-04-07 NOTE — Progress Notes (Signed)
SATURATION QUALIFICATIONS: (This note is used to comply with regulatory documentation for home oxygen)  Patient Saturations on Room Air at Rest = 94%  Patient Saturations on Room Air while Ambulating = 85%  Patient Saturations on 2 Liters of oxygen while Ambulating = %  Please briefly explain why patient needs home oxygen: Ambulated pt on Room air sats 92-94%, but by end of walk sat dropped to 85%. Pt DOE and worsened the more he walked. O2 replaced 2L when his sat dropped at end of walk. His sat improved to 97% on 2L.

## 2012-04-07 NOTE — Progress Notes (Signed)
PULMONARY  / CRITICAL CARE MEDICINE  Name: SUFYAAN PALMA MRN: [id number] DOB: December 12, 1940    LOS: 4  REFERRING MD :  Katrinka Blazing   CHIEF COMPLAINT:  Acute hypoxic respiratory failure   BRIEF PATIENT DESCRIPTION:  71 year old male admitted on 11/23 for NSTEMI. PCCM asked to see on 11/25 for persistent hypoxia (sats as low as 70s on room air) in spite of diuretics, V/Q scan was negative.   LINES / TUBES:  CULTURES: mrsa 11/23: negative   ANTIBIOTICS:  SIGNIFICANT EVENTS:  ECHO 11/23: EF 50-55%, mild to moderate MR V/q scan 11/25: no evidence of PE (low prob)  LEVEL OF CARE:  ICU PRIMARY SERVICE:  [facility]  CONSULTANTS:  pulm CODE STATUS: full DIET:  Heart healthy DVT Px:  Heparin  GI Px:  PPI  HISTORY OF PRESENT ILLNESS:   71 y/o male with a history of CAD and tobacco abuse presented on 11/23 for chest pain evaluation. The am of 11/23  he work up from sleep with 8/10 chest pain that he described as tightness, non-radiating but associated with shortness of breath and nausea. Dx eval was c/w acute MI. He was admitted to the cardiology service. Treatment included emergent Left heart cath that showed occluded SVG to OM. Since admit he has had persistent hypoxia and rising trop I. Has had cough with thick white sputum, no fever, + SOB at rest. His LVEDP was 26 mmHg. He had a V/Q scan on 11/25 that was low-prob. PCCM asked to see on 11/15 for hypoxic respiratory failure.   INTERVAL HISTORY:  Comfortable, no new issues o/n  VITAL SIGNS: Temp:  [97.3 F (36.3 C)-99.1 F (37.3 C)] 99.1 F (37.3 C) (11/27 0730) Pulse Rate:  [74-98] 87  (11/27 0730) Resp:  [18-23] 18  (11/27 0730) BP: (112-141)/(46-74) 141/61 mmHg (11/27 0730) SpO2:  [90 %-99 %] 97 % (11/27 0734) FiO2 (%):  [40 %-50 %] 40 % (11/26 1600) Weight:  [80.9 kg (178 lb 5.6 oz)] 80.9 kg (178 lb 5.6 oz) (11/27 0500)  HEMODYNAMICS:   VENTILATOR SETTINGS: Vent Mode:  [-]  FiO2 (%):  [40 %-50 %] 40 % INTAKE /  OUTPUT:  Intake/Output Summary (Last 24 hours) at 04/07/12 0744 Last data filed at 04/07/12 0400  Gross per 24 hour  Intake 1447.65 ml  Output   1850 ml  Net -402.35 ml    PHYSICAL EXAMINATION: General:  71 year old male, not  In acute distress Neuro:  No neurologic focal def HEENT:  [place], MMM, + JVD Cardiovascular:  RRR + murmur  Lungs:  Diffuse Crackles, no accessory muscle use  Abdomen:  + bowel sounds No OM  Musculoskeletal:  Intact  Skin:  Intact    LABS: cbc  Lab 04/07/12 0425 04/06/12 0540 04/05/12 0500  WBC 12.1* 15.7* 14.7*  HGB 11.2* 12.3* 11.7*  HCT 33.5* 37.5* 35.3*   chemistry  Lab 04/07/12 0425 04/06/12 0540 04/04/12 0927 04/03/12 0735  NA 135 137 134* --  K 3.4* 3.6 4.3 --  CO2 26 23 26  --  GLUCOSE 124* 111* 132* --  BUN 39* 32* 18 --  CREATININE 1.77* 1.70* 1.67* --  CALCIUM 9.1 9.4 9.3 --  MG 2.2 -- -- 1.9  PHOS 3.7 -- -- --   Liver fxn  Lab 04/03/12 0735 04/03/12 0512  AST 20 20  ALT 14 16  ALKPHOS 72 75  BILITOT 0.3 0.3  PROT 6.6 7.0  ALBUMIN 3.7 3.7   coags  Lab 04/03/12  0527  APTT 32  INR 0.99   Sepsis markers  Lab 04/07/12 0425 04/06/12 0540 04/05/12 1645  LATICACIDVEN -- -- --  PROCALCITON 0.98 0.95 0.78   Cardiac markers  Lab 04/04/12 2141 04/04/12 1504 04/04/12 0927  CKTOTAL -- -- --  CKMB -- -- --  TROPONINI 14.23* 15.93* >20.00*   BNP  Lab 04/07/12 0425 04/06/12 0540  PROBNP 12204.0* 16941.0*   ABG  Lab 04/05/12 1537 04/03/12 0543  PHART 7.438 --  PCO2ART 33.9* --  PO2ART 65.0* --  HCO3 22.8 --  TCO2 24 24    CBG trend No results found for this basename: GLUCAP:5 in the last 168 hours  IMAGING: PCXR: bilateral interstitial airspace disease w/ what appears to be RML ATX, little change from 11/25  ECG:  DIAGNOSES: Principal Problem:  *NSTEMI (non-ST elevated myocardial infarction) Active Problems:  Acute on chronic diastolic CHF (congestive heart failure)  Pulmonary edema  Altered mental  status  Respiratory failure, acute-on-chronic   ASSESSMENT / PLAN:  PULMONARY  ASSESSMENT: 1) hypoxic respiratory failure in setting of Pulmonary edema and RML ATX  2) Presumed COPD V/Q scan reassuring: negative for PE. Suspect that this is primarily edema and poor mucous clearance in setting of MI and underlying COPD.   PLAN:   Push diuresis (needs negative fluid balance) as S Cr will permit Pulmonary hygiene: IS, BDs and flutter F/u CXR 11/28 Will need PFT after recovery Consider starting Spiriva, prn albuterol in prep for discharge  CARDIOVASCULAR  ASSESSMENT:  CAD >> NSTEMI >> L heart cath w multiple vein grafts occluded Acute decompensated diastolic dysfxn w/ pulmonary edema  H/o PAF currently NSR  PLAN:  Cont diuresis Cont anticoagulation per cards Cont BB rx and statin    RENAL  ASSESSMENT:   Chronic Renal insuff.  Baseline [place] 1.45 to 1.58, stable now at 1.7 PLAN:   Agree w/ escalation of diuretics  F/u chemistry and S Cr Renal adjust meds  GASTROINTESTINAL  ASSESSMENT:   No acute issues  PLAN:   Cont PPI   HEMATOLOGIC  ASSESSMENT:   Chronic mild anemia.  No current evidence of bleeding.  PLAN:  Trend CBC Cont heparin   INFECTIOUS  ASSESSMENT:   No acute evidence of infection, Pct indeterminant PLAN:   Trend CBC and Pct, clinical status  ENDOCRINE  ASSESSMENT:   No acute issues  PLAN:     NEUROLOGIC  ASSESSMENT:   Acute encephalopathy >> continues to have some intermittent confusion  PLAN:   Thiamine and folate  Will find out his baseline from family  CLINICAL SUMMARY:  This is a 71 yo male who was admitted on 11/23 w/ NSTEMI. PCCM asked to see for progressive hypoxia. CXR c/w pulmonary edema and ATX. Think this is superimposed on underlying COPD. Don't think this is AE of COPD. At this point would push diuresis and focus on pulmonary hygiene. This is not PE. Suspect on-going hypoxia not helping ischemia picture. He also has  some progressive confusion. This could be hypoxia driven but would also question W/D.   I have personally obtained a history, examined the patient, evaluated laboratory and imaging results, formulated the assessment and plan and placed orders.   Levy Pupa, MD, PhD 04/07/2012, 7:44 AM [facility] [phone] or if no answer [phone]

## 2012-04-08 DIAGNOSIS — F17200 Nicotine dependence, unspecified, uncomplicated: Secondary | ICD-10-CM | POA: Diagnosis present

## 2012-04-08 LAB — BASIC METABOLIC PANEL
GFR calc Af Amer: 46 mL/min — ABNORMAL LOW (ref >90)
GFR calc non Af Amer: 40 mL/min — ABNORMAL LOW (ref >90)
Glucose, Bld: 112 mg/dL — ABNORMAL HIGH (ref 70–99)
Potassium: 3.5 mEq/L (ref 3.5–5.1)
Sodium: 137 mEq/L (ref 135–145)

## 2012-04-08 MED ORDER — FUROSEMIDE 40 MG PO TABS: 40.0000 mg | ORAL_TABLET | Freq: Two times a day (BID) | ORAL | Status: DC

## 2012-04-08 MED ORDER — ALBUTEROL SULFATE (5 MG/ML) 0.5% IN NEBU: 2.5000 mg | INHALATION_SOLUTION | Freq: Four times a day (QID) | RESPIRATORY_TRACT | Status: DC | PRN

## 2012-04-08 NOTE — Discharge Summary (Signed)
Patient ID: Nathan [name] MRN: [id number] DOB/AGE: 17-May-1940 71 y.o.  Admit date: 04/03/2012 Discharge date: 04/08/2012  Primary Discharge Diagnosis: Non-ST elevation myocardial infarction  Secondary Discharge Diagnosis: Acute respiratory failure, COPD, acute on chronic diastolic heart failure, chronic kidney disease stage IV, hypoxia, altered mental status/confusion, hypertension  Significant Diagnostic Studies: Echocardiogram:  - Left ventricle: The cavity size was mildly dilated. Systolic function was normal. The estimated ejection fraction was in the range of 50% to 55%. Left ventricular diastolic function parameters were normal. - Mitral valve: Mild to moderate regurgitation. - Left atrium: The atrium was mildly dilated. - Right atrium: The atrium was mildly dilated. - Atrial septum: No defect or patent foramen ovale was identified.   VQ scan: IMPRESSION:  No evidence of pulmonary embolism. Diffusely inhomogeneous  ventilation of both lungs compatible with a probable combination of  COPD and pneumonia and/or congestive heart failure.   Emergent Cardiac Cathereterization  Nathan [name], 71 y.o., male  Full note dictated; see diagram in chart.  DICTATION # [id number], [id number]  Ao: 135/70  LV: 135/13/26  LM: nl  LAD: 505 in region of DX1 takeoff with brisk filling of DX vessel  LCX: occluded proximally  RCA: diffuse prox and mid 95% stenosis  SVG to RCA: patent  SVG to OM: occluded at origin  SVG to DX1: occluded at origin  LIMA to LAD: patent  EF 50 - 55% with mild mid inferior and inferolateral hypocontractility.  Tolerated well.  REC: Patient is currently completely pain free; medical therapy.  Nathan Bihari, MD, [facility]  04/03/2012  11:04 AM   Consults: Pulmonary-Dr. [facility] Course: 71 year old with severe coronary artery disease status post bypass with peripheral vascular disease as well, current smoker, COPD who presented with chest pain. He  underwent CABG x4 on 10/31/11 and developed acute shortness of breath/chest pain with concomitant ST segment depressions in V2 through V5.  An emergent cardiac catheterization was performed which demonstrated patent SVG to RCA, patent LIMA to LAD but the SVG to OM as well as SVG to diagonal was occluded at its origin. The left circumflex was also occluded proximally. Medical therapy.  Post catheterization he was chest pain-free but remained significantly hypoxic. Pulmonary medicine following. BNP was elevated. Total of 3.4 L diuresis took place. Dr. Katrinka Blazing notes that he was quite sensitive to Lasix previously with only 20 mg of Lasix causing renal insufficiency in the past. Currently he is on Lasix 40 mg twice a day with a creatinine of 1.7. At home, Dr. Katrinka Blazing requested Lasix 40 mg once a day.  Yesterday, Dr. Katrinka Blazing was planning on discharging him however he had a lengthy discussion with he and his wife and because of hypoxic episodes during sleep as well as shortness of breath the day prior his discharge was put on hold. Yesterday he states that he ambulated and did not have any specific complaints. His wife states that he will always paint a rosier picture than reality. She states that he does appear improved from yesterday however. They're both eager to be discharged home and she feels that from a mental aspect, he will fare better at home.  We have set up home health, oxygen. I expressed the importance of him utilizing the oxygen. Also expressed the importance of tobacco cessation. I discussed his kidney function.  Discharge Exam: Blood pressure 142/74, pulse 95, temperature 99 F (37.2 C), temperature source Oral, resp. rate 17, height 6' 1.62" (1.87 m), weight 80.5 kg (177 lb 7.5  oz), SpO2 91.00%.    General: Alert, oriented x3, very pleasant, jovial CV: Regular rate and rhythm 2/6 systolic murmur apex, I do not appreciate any significant JVD on exam today. Dr. Katrinka Blazing had seen elevated JVD  yesterday. Lungs: Mild scattered wheezes heard throughout, normal appearing respiratory effort at rest. Abdomen: Soft, nontender.  Extremities: No edema present. Noncyanotic.  Labs:   Lab Results  Component Value Date   WBC 12.1* 04/07/2012   HGB 11.2* 04/07/2012   HCT 33.5* 04/07/2012   MCV 86.3 04/07/2012   PLT 125* 04/07/2012     Lab 04/08/12 0500 04/03/12 0735  NA 137 --  K 3.5 --  CL 99 --  CO2 26 --  BUN 40* --  CREATININE 1.65* --  CALCIUM 9.2 --  PROT -- 6.6  BILITOT -- 0.3  ALKPHOS -- 72  ALT -- 14  AST -- 20  GLUCOSE 112* --   Lab Results  Component Value Date   TROPONINI 14.23* 04/04/2012    Lab Results  Component Value Date   CHOL 140 04/04/2012   CHOL 104 11/11/2011   Lab Results  Component Value Date   HDL 25* 04/04/2012   Lab Results  Component Value Date   LDLCALC 78 04/04/2012   Lab Results  Component Value Date   TRIG 183* 04/04/2012   TRIG 159* 11/11/2011   Lab Results  Component Value Date   CHOLHDL 5.6 04/04/2012   No results found for this basename: LDLDIRECT    FOLLOW UP PLANS AND APPOINTMENTS     Discharge Orders    Future Appointments: Provider: Department: Dept Phone: Center:   04/12/2012 1:15 PM [facility]-Phase2 Monitor 7 [facility] CARDIAC REHAB [phone] None   04/14/2012 1:15 PM Mc-Phase2 Monitor 7 [facility] CARDIAC REHAB [phone] None   04/16/2012 1:15 PM Mc-Phase2 Monitor 7 [facility] CARDIAC REHAB [phone] None   04/19/2012 1:15 PM Mc-Phase2 Monitor 7 [facility] CARDIAC REHAB [phone] None   04/21/2012 1:15 PM Mc-Phase2 Monitor 7 [facility] CARDIAC REHAB [phone] None   04/23/2012 1:15 PM Mc-Phase2 Monitor 7 [facility] CARDIAC REHAB [phone] None   04/26/2012 1:15 PM Mc-Phase2 Monitor 7 [facility] CARDIAC REHAB [phone] None   04/28/2012 1:15 PM Mc-Phase2 Monitor 7 [facility] CARDIAC REHAB [phone] None   04/30/2012 1:15 PM Mc-Phase2 Monitor 7 [facility] CARDIAC REHAB [phone] None   05/03/2012 1:15 PM Mc-Phase2 Monitor 7 [facility] CARDIAC REHAB [phone] None   05/07/2012 1:15 PM Mc-Phase2 Monitor 7 [facility] CARDIAC REHAB [phone] None   05/10/2012 1:15 PM Mc-Phase2 Monitor 7 [facility] CARDIAC REHAB [phone] None   05/14/2012 1:15 PM Mc-Phase2 Monitor 7 [facility] CARDIAC REHAB [phone] None   05/17/2012 1:15 PM Mc-Phase2 Monitor 7 [facility] CARDIAC REHAB [phone] None   05/19/2012 1:15 PM Mc-Phase2 Monitor 7 [facility] CARDIAC REHAB [phone] None   05/21/2012 1:15 PM Mc-Phase2 Monitor 7 [facility] CARDIAC REHAB [phone] None   05/24/2012 1:15 PM Mc-Phase2 Monitor 7 [facility] CARDIAC REHAB [phone] None   05/26/2012 1:15 PM Mc-Phase2 Monitor 7 [facility] CARDIAC REHAB [phone] None   05/28/2012 1:15 PM Mc-Phase2 Monitor 7 [facility] CARDIAC REHAB [phone] None   05/31/2012 6:45 AM Mc-Phase2 Monitor 6 [facility] CARDIAC REHAB [phone] None  05/31/2012 2:00 PM Vvs-Lab Lab 5 [facility] [phone] VVS   05/31/2012 3:00 PM Nada Libman, MD [facility] [phone] VVS   06/02/2012 1:15 PM Mc-Phase2 Monitor 7 [facility] CARDIAC REHAB [phone] None   06/04/2012 1:15 PM Mc-Phase2 Monitor 7 [facility] CARDIAC REHAB [phone] None   06/07/2012 1:15 PM Mc-Phase2 Monitor 7 [facility] CARDIAC REHAB [phone] None   06/09/2012 1:15 PM Mc-Phase2 Monitor 7 [facility] CARDIAC REHAB [phone] None   06/11/2012 1:15 PM Mc-Phase2 Monitor 7 MOSES [facility]  CARDIAC REHAB [phone] None     Future Orders Please Complete By Expires   Diet - low sodium heart healthy      Increase activity slowly          Medication List     As of 04/08/2012  8:45 AM    TAKE these medications         acetaminophen 325 MG tablet   Commonly known as: TYLENOL   Take 650 mg by mouth every 6 (six) hours as needed.      allopurinol 100 MG tablet   Commonly known as: ZYLOPRIM   Take 200 mg by mouth daily.      amLODipine 10 MG tablet   Commonly known as: NORVASC   Take 10 mg by mouth daily.      aspirin 325 MG tablet   Take 325 mg by mouth at bedtime.      buPROPion 150 MG 24 hr tablet   Commonly known as: WELLBUTRIN XL   Take 150 mg by mouth daily.      COLCRYS 0.6 MG tablet   Generic drug: colchicine   Take 0.6 mg by mouth daily.      CULTURELLE DIGESTIVE HEALTH Caps   Take 1 capsule by mouth daily.      DOXYLAMINE SUCCINATE (SLEEP) PO   Take 25 mg by mouth at bedtime.      furosemide 40 MG tablet   Commonly known as: LASIX   Take 1 tablet (40 mg total) by mouth daily.      Melatonin 3 MG Tabs   Take 2 tablets by mouth at bedtime.      metoprolol tartrate 25 MG tablet   Commonly known as: LOPRESSOR   Take 25 mg by mouth 2 (two) times daily.      nitroGLYCERIN 0.4 MG SL tablet   Commonly known as: NITROSTAT   Place 1 tablet (0.4 mg total) under the tongue every 5 (five) minutes x 3 doses as needed for chest pain.      omeprazole 20 MG capsule   Commonly known as: PRILOSEC   Take 1 capsule (20 mg total) by mouth daily. 1/2 before food      potassium chloride SA 20 MEQ tablet   Commonly known as: K-DUR,KLOR-CON   Take 1 tablet (20 mEq total) by mouth daily.      simvastatin 10 MG tablet   Commonly known as: ZOCOR   Take 10 mg by mouth at bedtime.        Follow-up Information    Follow up with Latanya Presser, MD. Schedule an appointment as soon as possible for a visit in 1 week.      Follow up with Lesleigh Noe,  MD. Cristopher Peru, NP would suffice as well. Needs BMET. Would like him to be seen mid week, next week. )  Contact information:   [address] [place] [address] [phone]          BRING ALL MEDICATIONS WITH YOU TO FOLLOW UP APPOINTMENTS  Time spent with patient to include physician time: 35 minutes spent with patient, family, review of medical records, review of medications. SignedDonato Schultz 04/08/2012, 8:45 AM

## 2012-04-08 NOTE — Progress Notes (Signed)
Pt discharge instructions and education complete. IV site d/c. Site WNL. Home health arranged. Home oxygen arranged. MD to order pulse oximetry test for home. No s/s of distress. D/c home with wife.

## 2012-04-09 ENCOUNTER — Ambulatory Visit (HOSPITAL_COMMUNITY): Payer: Medicare PPO

## 2012-04-09 NOTE — Progress Notes (Signed)
Spoke with [place] at [employer] in reference to home oxygen.  Home oxygen referral sent 04/07/12 by D. Clarisa Fling, Nurse Case Mgr.  [place] states when [place] called to deliver O2,  patient requested oxygen be delivered on 04/09/12.  [employer] rep given wife's cell # to call ASAP, as apparently wife had called this am, stating that oxygen had not been delivered yet.   Notified Dr. Anne Fu of above...followed up with wife, who stated Christoper Allegra has already called and will be delivering home oxygen today between 1:00 and 2:30pm.  She was very appreciative of call.    Jerrell Belfast, RN, BSN Phone #[phone]

## 2012-04-12 ENCOUNTER — Ambulatory Visit (HOSPITAL_COMMUNITY): Payer: Medicare PPO

## 2012-04-12 ENCOUNTER — Encounter (HOSPITAL_COMMUNITY): Payer: Medicare PPO

## 2012-04-14 ENCOUNTER — Ambulatory Visit (HOSPITAL_COMMUNITY): Payer: Medicare PPO

## 2012-04-14 ENCOUNTER — Encounter (HOSPITAL_COMMUNITY): Payer: Medicare PPO

## 2012-04-16 ENCOUNTER — Ambulatory Visit (HOSPITAL_COMMUNITY): Payer: Medicare PPO

## 2012-04-16 ENCOUNTER — Encounter (HOSPITAL_COMMUNITY): Admission: RE | Admit: 2012-04-16 | Payer: Medicare PPO | Source: Ambulatory Visit

## 2012-04-17 ENCOUNTER — Emergency Department (HOSPITAL_COMMUNITY): Payer: Medicare PPO

## 2012-04-17 ENCOUNTER — Encounter (HOSPITAL_COMMUNITY): Payer: Self-pay | Admitting: Emergency Medicine

## 2012-04-17 ENCOUNTER — Inpatient Hospital Stay (HOSPITAL_COMMUNITY)
Admission: EM | Admit: 2012-04-17 | Discharge: 2012-04-22 | DRG: 208 | Disposition: A | Discharge status: Home / Self Care | Payer: Medicare PPO | Attending: Emergency Medicine | Admitting: Emergency Medicine

## 2012-04-17 DIAGNOSIS — Z9119 Patient's noncompliance with other medical treatment and regimen: Secondary | ICD-10-CM

## 2012-04-17 DIAGNOSIS — E872 Acidosis, unspecified: Secondary | ICD-10-CM | POA: Diagnosis present

## 2012-04-17 DIAGNOSIS — I340 Nonrheumatic mitral (valve) insufficiency: Secondary | ICD-10-CM

## 2012-04-17 DIAGNOSIS — Z91199 Patient's noncompliance with other medical treatment and regimen due to unspecified reason: Secondary | ICD-10-CM

## 2012-04-17 DIAGNOSIS — L298 Other pruritus: Secondary | ICD-10-CM | POA: Diagnosis not present

## 2012-04-17 DIAGNOSIS — R0902 Hypoxemia: Secondary | ICD-10-CM

## 2012-04-17 DIAGNOSIS — I25810 Atherosclerosis of coronary artery bypass graft(s) without angina pectoris: Secondary | ICD-10-CM | POA: Diagnosis present

## 2012-04-17 DIAGNOSIS — J4489 Other specified chronic obstructive pulmonary disease: Secondary | ICD-10-CM | POA: Diagnosis present

## 2012-04-17 DIAGNOSIS — J9600 Acute respiratory failure, unspecified whether with hypoxia or hypercapnia: Secondary | ICD-10-CM

## 2012-04-17 DIAGNOSIS — Z8673 Personal history of transient ischemic attack (TIA), and cerebral infarction without residual deficits: Secondary | ICD-10-CM

## 2012-04-17 DIAGNOSIS — I2510 Atherosclerotic heart disease of native coronary artery without angina pectoris: Secondary | ICD-10-CM

## 2012-04-17 DIAGNOSIS — J411 Mucopurulent chronic bronchitis: Secondary | ICD-10-CM

## 2012-04-17 DIAGNOSIS — F0390 Unspecified dementia without behavioral disturbance: Secondary | ICD-10-CM | POA: Diagnosis present

## 2012-04-17 DIAGNOSIS — E8729 Other acidosis: Secondary | ICD-10-CM

## 2012-04-17 DIAGNOSIS — K219 Gastro-esophageal reflux disease without esophagitis: Secondary | ICD-10-CM | POA: Diagnosis present

## 2012-04-17 DIAGNOSIS — R4182 Altered mental status, unspecified: Secondary | ICD-10-CM

## 2012-04-17 DIAGNOSIS — I5033 Acute on chronic diastolic (congestive) heart failure: Secondary | ICD-10-CM

## 2012-04-17 DIAGNOSIS — I6529 Occlusion and stenosis of unspecified carotid artery: Secondary | ICD-10-CM

## 2012-04-17 DIAGNOSIS — I739 Peripheral vascular disease, unspecified: Secondary | ICD-10-CM

## 2012-04-17 DIAGNOSIS — I214 Non-ST elevation (NSTEMI) myocardial infarction: Secondary | ICD-10-CM

## 2012-04-17 DIAGNOSIS — I509 Heart failure, unspecified: Secondary | ICD-10-CM | POA: Diagnosis present

## 2012-04-17 DIAGNOSIS — I4892 Unspecified atrial flutter: Secondary | ICD-10-CM

## 2012-04-17 DIAGNOSIS — N183 Chronic kidney disease, stage 3 unspecified: Secondary | ICD-10-CM | POA: Diagnosis present

## 2012-04-17 DIAGNOSIS — I059 Rheumatic mitral valve disease, unspecified: Secondary | ICD-10-CM | POA: Diagnosis present

## 2012-04-17 DIAGNOSIS — Z79899 Other long term (current) drug therapy: Secondary | ICD-10-CM

## 2012-04-17 DIAGNOSIS — I252 Old myocardial infarction: Secondary | ICD-10-CM

## 2012-04-17 DIAGNOSIS — J9620 Acute and chronic respiratory failure, unspecified whether with hypoxia or hypercapnia: Principal | ICD-10-CM

## 2012-04-17 DIAGNOSIS — E785 Hyperlipidemia, unspecified: Secondary | ICD-10-CM

## 2012-04-17 DIAGNOSIS — Y9210 Unspecified residential institution as the place of occurrence of the external cause: Secondary | ICD-10-CM | POA: Diagnosis not present

## 2012-04-17 DIAGNOSIS — Z951 Presence of aortocoronary bypass graft: Secondary | ICD-10-CM

## 2012-04-17 DIAGNOSIS — Z7982 Long term (current) use of aspirin: Secondary | ICD-10-CM

## 2012-04-17 DIAGNOSIS — L270 Generalized skin eruption due to drugs and medicaments taken internally: Secondary | ICD-10-CM | POA: Diagnosis not present

## 2012-04-17 DIAGNOSIS — M109 Gout, unspecified: Secondary | ICD-10-CM | POA: Diagnosis present

## 2012-04-17 DIAGNOSIS — L2989 Other pruritus: Secondary | ICD-10-CM | POA: Diagnosis not present

## 2012-04-17 DIAGNOSIS — F17200 Nicotine dependence, unspecified, uncomplicated: Secondary | ICD-10-CM

## 2012-04-17 DIAGNOSIS — J189 Pneumonia, unspecified organism: Secondary | ICD-10-CM | POA: Diagnosis present

## 2012-04-17 DIAGNOSIS — J95821 Acute postprocedural respiratory failure: Secondary | ICD-10-CM

## 2012-04-17 DIAGNOSIS — J449 Chronic obstructive pulmonary disease, unspecified: Secondary | ICD-10-CM

## 2012-04-17 DIAGNOSIS — I4891 Unspecified atrial fibrillation: Secondary | ICD-10-CM

## 2012-04-17 DIAGNOSIS — J811 Chronic pulmonary edema: Secondary | ICD-10-CM

## 2012-04-17 DIAGNOSIS — I10 Essential (primary) hypertension: Secondary | ICD-10-CM

## 2012-04-17 DIAGNOSIS — E876 Hypokalemia: Secondary | ICD-10-CM | POA: Diagnosis not present

## 2012-04-17 DIAGNOSIS — N179 Acute kidney failure, unspecified: Secondary | ICD-10-CM

## 2012-04-17 DIAGNOSIS — I129 Hypertensive chronic kidney disease with stage 1 through stage 4 chronic kidney disease, or unspecified chronic kidney disease: Secondary | ICD-10-CM | POA: Diagnosis present

## 2012-04-17 DIAGNOSIS — Z981 Arthrodesis status: Secondary | ICD-10-CM

## 2012-04-17 DIAGNOSIS — T361X5A Adverse effect of cephalosporins and other beta-lactam antibiotics, initial encounter: Secondary | ICD-10-CM | POA: Diagnosis not present

## 2012-04-17 DIAGNOSIS — N189 Chronic kidney disease, unspecified: Secondary | ICD-10-CM | POA: Diagnosis present

## 2012-04-17 LAB — CBC WITH DIFFERENTIAL/PLATELET
Basophils Absolute: 0 10*3/uL (ref 0.0–0.1)
Eosinophils Absolute: 0.6 10*3/uL (ref 0.0–0.7)
Lymphocytes Relative: 20 % (ref 12–46)
Lymphs Abs: 3.5 10*3/uL (ref 0.7–4.0)
Neutrophils Relative %: 73 % (ref 43–77)
Platelets: 481 10*3/uL — ABNORMAL HIGH (ref 150–400)
RBC: 3.93 MIL/uL — ABNORMAL LOW (ref 4.22–5.81)
RDW: 17.9 % — ABNORMAL HIGH (ref 11.5–15.5)
WBC: 17.5 10*3/uL — ABNORMAL HIGH (ref 4.0–10.5)

## 2012-04-17 LAB — BASIC METABOLIC PANEL
BUN: 26 mg/dL — ABNORMAL HIGH (ref 6–23)
BUN: 27 mg/dL — ABNORMAL HIGH (ref 6–23)
CO2: 24 mEq/L (ref 19–32)
Calcium: 8.7 mg/dL (ref 8.4–10.5)
Chloride: 107 mEq/L (ref 96–112)
Chloride: 106 mEq/L (ref 96–112)
Creatinine, Ser: 2.3 mg/dL — ABNORMAL HIGH (ref 0.50–1.35)
Creatinine, Ser: 2.43 mg/dL — ABNORMAL HIGH (ref 0.50–1.35)
GFR calc Af Amer: 34 mL/min — ABNORMAL LOW (ref >90)
GFR calc Af Amer: 31 mL/min — ABNORMAL LOW (ref >90)
GFR calc non Af Amer: 29 mL/min — ABNORMAL LOW (ref >90)
GFR calc non Af Amer: 27 mL/min — ABNORMAL LOW (ref >90)
Glucose, Bld: 281 mg/dL — ABNORMAL HIGH (ref 70–99)
Potassium: 5.1 mEq/L (ref 3.5–5.1)
Potassium: 4.7 mEq/L (ref 3.5–5.1)
Sodium: 137 mEq/L (ref 135–145)

## 2012-04-17 LAB — PRO B NATRIURETIC PEPTIDE: Pro B Natriuretic peptide (BNP): 8088 pg/mL — ABNORMAL HIGH (ref 0–125)

## 2012-04-17 LAB — URINE MICROSCOPIC-ADD ON

## 2012-04-17 LAB — URINALYSIS, MICROSCOPIC ONLY
Bilirubin Urine: NEGATIVE
Ketones, ur: NEGATIVE mg/dL
Nitrite: NEGATIVE
Protein, ur: NEGATIVE mg/dL
Urobilinogen, UA: 0.2 mg/dL (ref 0.0–1.0)

## 2012-04-17 LAB — TROPONIN I
Troponin I: 0.5 ng/mL (ref <0.30)
Troponin I: 0.72 ng/mL (ref <0.30)

## 2012-04-17 LAB — PROCALCITONIN: Procalcitonin: 0.22 ng/mL

## 2012-04-17 LAB — URINALYSIS, ROUTINE W REFLEX MICROSCOPIC
Leukocytes, UA: NEGATIVE
Nitrite: NEGATIVE
Specific Gravity, Urine: 1.027 (ref 1.005–1.030)
Urobilinogen, UA: 0.2 mg/dL (ref 0.0–1.0)

## 2012-04-17 LAB — POCT I-STAT 3, ART BLOOD GAS (G3+)
Acid-base deficit: 6 mmol/L — ABNORMAL HIGH (ref 0.0–2.0)
Bicarbonate: 23.3 mEq/L (ref 20.0–24.0)
O2 Saturation: 100 %
O2 Saturation: 93 %
TCO2: 25 mmol/L (ref 0–100)
pCO2 arterial: 48.2 mmHg — ABNORMAL HIGH (ref 35.0–45.0)
pH, Arterial: 7.291 — ABNORMAL LOW (ref 7.350–7.450)
pO2, Arterial: 275 mmHg — ABNORMAL HIGH (ref 80.0–100.0)

## 2012-04-17 LAB — HEPARIN LEVEL (UNFRACTIONATED): Heparin Unfractionated: 0.24 IU/mL — ABNORMAL LOW (ref 0.30–0.70)

## 2012-04-17 LAB — GLUCOSE, CAPILLARY: Glucose-Capillary: 87 mg/dL (ref 70–99)

## 2012-04-17 MED ORDER — FUROSEMIDE 10 MG/ML IJ SOLN
80.0000 mg | Freq: Once | INTRAMUSCULAR | Status: AC
  Administered 2012-04-17: 80 mg via INTRAVENOUS
  Filled 2012-04-17: qty 8

## 2012-04-17 MED ORDER — ALLOPURINOL 100 MG PO TABS
200.0000 mg | ORAL_TABLET | Freq: Every day | ORAL | Status: DC
  Administered 2012-04-17 – 2012-04-22 (×6): 200 mg via ORAL
  Filled 2012-04-17 (×7): qty 2

## 2012-04-17 MED ORDER — COLCHICINE 0.6 MG PO TABS
0.6000 mg | ORAL_TABLET | Freq: Every day | ORAL | Status: DC
  Administered 2012-04-17 – 2012-04-22 (×6): 0.6 mg via ORAL
  Filled 2012-04-17 (×6): qty 1

## 2012-04-17 MED ORDER — SIMVASTATIN 10 MG PO TABS
10.0000 mg | ORAL_TABLET | Freq: Every day | ORAL | Status: DC
  Administered 2012-04-17 – 2012-04-21 (×5): 10 mg via ORAL
  Filled 2012-04-17 (×6): qty 1

## 2012-04-17 MED ORDER — HEPARIN BOLUS VIA INFUSION
4000.0000 [IU] | Freq: Once | INTRAVENOUS | Status: AC
  Administered 2012-04-17: 4000 [IU] via INTRAVENOUS

## 2012-04-17 MED ORDER — SODIUM CHLORIDE 0.9 % IV SOLN
250.0000 mL | INTRAVENOUS | Status: DC | PRN
  Administered 2012-04-21: 250 mL via INTRAVENOUS

## 2012-04-17 MED ORDER — PANTOPRAZOLE SODIUM 40 MG PO PACK
40.0000 mg | PACK | Freq: Every day | ORAL | Status: DC
  Administered 2012-04-17 – 2012-04-19 (×3): 40 mg
  Filled 2012-04-17 (×3): qty 20

## 2012-04-17 MED ORDER — FUROSEMIDE 10 MG/ML IJ SOLN
40.0000 mg | Freq: Two times a day (BID) | INTRAMUSCULAR | Status: AC
  Administered 2012-04-17: 40 mg via INTRAVENOUS
  Filled 2012-04-17: qty 4

## 2012-04-17 MED ORDER — ASPIRIN 81 MG PO CHEW
324.0000 mg | CHEWABLE_TABLET | ORAL | Status: AC
  Filled 2012-04-17: qty 1

## 2012-04-17 MED ORDER — IPRATROPIUM BROMIDE 0.02 % IN SOLN
0.5000 mg | Freq: Once | RESPIRATORY_TRACT | Status: AC
  Administered 2012-04-17: 0.5 mg via RESPIRATORY_TRACT
  Filled 2012-04-17: qty 2.5

## 2012-04-17 MED ORDER — NITROGLYCERIN IN D5W 200-5 MCG/ML-% IV SOLN
INTRAVENOUS | Status: AC
  Administered 2012-04-17: 10 ug/min via INTRAVENOUS
  Filled 2012-04-17: qty 250

## 2012-04-17 MED ORDER — ALBUTEROL SULFATE HFA 108 (90 BASE) MCG/ACT IN AERS
8.0000 | INHALATION_SPRAY | RESPIRATORY_TRACT | Status: DC | PRN
  Administered 2012-04-17 – 2012-04-18 (×2): 8 via RESPIRATORY_TRACT
  Filled 2012-04-17: qty 6.7

## 2012-04-17 MED ORDER — FENTANYL CITRATE 0.05 MG/ML IJ SOLN
INTRAMUSCULAR | Status: AC
  Administered 2012-04-17: 100 ug
  Filled 2012-04-17: qty 2

## 2012-04-17 MED ORDER — ALBUTEROL SULFATE (5 MG/ML) 0.5% IN NEBU
5.0000 mg | INHALATION_SOLUTION | Freq: Once | RESPIRATORY_TRACT | Status: AC
  Administered 2012-04-17: 5 mg via RESPIRATORY_TRACT
  Filled 2012-04-17: qty 1

## 2012-04-17 MED ORDER — SODIUM CHLORIDE 0.9 % IV SOLN
25.0000 ug/h | INTRAVENOUS | Status: DC
  Administered 2012-04-17: 25 ug/h via INTRAVENOUS
  Administered 2012-04-18: 75 ug/h via INTRAVENOUS
  Filled 2012-04-17 (×2): qty 50

## 2012-04-17 MED ORDER — FENTANYL BOLUS VIA INFUSION
25.0000 ug | Freq: Four times a day (QID) | INTRAVENOUS | Status: DC | PRN
  Filled 2012-04-17: qty 100

## 2012-04-17 MED ORDER — SODIUM CHLORIDE 0.9 % IV SOLN
INTRAVENOUS | Status: DC
  Administered 2012-04-18: 10:00:00 via INTRAVENOUS

## 2012-04-17 MED ORDER — ASPIRIN 300 MG RE SUPP
300.0000 mg | RECTAL | Status: AC
  Administered 2012-04-17: 300 mg via RECTAL

## 2012-04-17 MED ORDER — METOPROLOL TARTRATE 12.5 MG HALF TABLET
12.5000 mg | ORAL_TABLET | Freq: Two times a day (BID) | ORAL | Status: DC
  Administered 2012-04-17: 12.5 mg via ORAL
  Filled 2012-04-17 (×4): qty 1

## 2012-04-17 MED ORDER — SODIUM CHLORIDE 0.9 % IV SOLN
1.0000 mg/h | INTRAVENOUS | Status: DC
  Administered 2012-04-17: 1 mg/h via INTRAVENOUS
  Administered 2012-04-17: 2 mg/h via INTRAVENOUS
  Filled 2012-04-17 (×2): qty 10

## 2012-04-17 MED ORDER — NITROGLYCERIN IN D5W 200-5 MCG/ML-% IV SOLN
2.0000 ug/min | INTRAVENOUS | Status: DC
  Administered 2012-04-17 (×2): 10 ug/min via INTRAVENOUS

## 2012-04-17 MED ORDER — MIDAZOLAM HCL 2 MG/2ML IJ SOLN
INTRAMUSCULAR | Status: AC
  Administered 2012-04-17: 2 mg
  Filled 2012-04-17: qty 4

## 2012-04-17 MED ORDER — HEPARIN (PORCINE) IN NACL 100-0.45 UNIT/ML-% IJ SOLN
1750.0000 [IU]/h | INTRAMUSCULAR | Status: DC
  Administered 2012-04-17: 1250 [IU]/h via INTRAVENOUS
  Administered 2012-04-17: 1400 [IU]/h via INTRAVENOUS
  Administered 2012-04-18 – 2012-04-19 (×3): 1600 [IU]/h via INTRAVENOUS
  Filled 2012-04-17 (×7): qty 250

## 2012-04-17 MED ORDER — MIDAZOLAM BOLUS VIA INFUSION
1.0000 mg | INTRAVENOUS | Status: DC | PRN
  Administered 2012-04-17: 2 mg via INTRAVENOUS
  Filled 2012-04-17: qty 2

## 2012-04-17 NOTE — Progress Notes (Signed)
ANTICOAGULATION CONSULT NOTE - Initial Consult  Pharmacy Consult for heparin Indication: NSTEMI  No Known Allergies  Patient Measurements: Height: 6' 1.62" (187 cm) Weight: 177 lb 7.5 oz (80.5 kg) IBW/kg (Calculated) : 81.33   Vital Signs: Temp: 97.9 F (36.6 C) (12/07 0627) Temp src: Rectal (12/07 0627) BP: 126/59 mmHg (12/07 0627) Pulse Rate: 74  (12/07 0627)  Labs:  Basename 04/17/12 0545  HGB 11.3*  HCT 36.9*  PLT 481*  APTT --  LABPROT --  INR --  HEPARINUNFRC --  CREATININE 2.11*  CKTOTAL --  CKMB --  TROPONINI 0.50*    Estimated Creatinine Clearance: 35.5 ml/min (by C-G formula based on Cr of 2.11).   Medical History: Past Medical History  Diagnosis Date  . Aorto-iliac disease     bilat with stent 2007  . CAD (coronary artery disease)     with loop to the circumflex 2000, inferolateral infarction  . Hypertension   . Paroxysmal atrial flutter     ablation  . ED (erectile dysfunction)   . COPD (chronic obstructive pulmonary disease)   . Hyperlipidemia   . Stroke   . Shortness of breath   . GERD (gastroesophageal reflux disease)   . Gout     Assessment: 71yo male s/p CABG this past June and discharged just over a week ago s/p NSTEMI now presents with progressive SOB/exertional dyspnea, EMS started CPAP but progressed to agonal breathing and required intubation en route, showing elevated troponin (though lower than during last admission), to begin heparin.  Goal of Therapy:  Heparin level 0.3-0.7 units/ml Monitor platelets by anticoagulation protocol: Yes   Plan:  Will give heparin bolus of 4000 units x1 and begin heparin gtt at 1250 units/hr and monitor heparin levels and CBC.  Colleen Can PharmD BCPS 04/17/2012,7:12 AM

## 2012-04-17 NOTE — Consult Note (Signed)
CARDIOLOGY CONSULT NOTE  Patient ID: Nathan [name] MRN: [id number], DOB/AGE: [birth year]   Admit date: 04/17/2012 Date of Consult: 04/17/2012  Primary Physician: Lillia Mountain, MD Primary Cardiologist: Verdis Prime, MD Reason for Consultation: Positive troponin, known CAD  History of Present Illness Nathan [name] is a 71 year old man with CAD s/p 4 vessel CABG June 2013, recent NSTEMI 04/03/2012 s/p cardiac cath revealing 2/4 grafts patent, normal LV function, PVD, HTN and COPD with continued tobacco abuse who is being admitted with acute respiratory failure. He is intubated and sedated; therefore, his history is obtained from the chart and his wife who is present at bedside. Nathan [name] wife reports he has been "distant" since his hospital discharge on 04/08/2012. He has "kept to himself" and, for the past 3 days, has not allowed his wife to give him his medications. She reports he began feeling short of breath last night around midnight and decided to use his home O2, which he had not been doing despite Dr. Michaelle Copas instructions. However, this did not help his SOB and around 5AM today he asked his wife to call 911. On EMS arrival, Nathan [name] was placed on BiPAP without improvement and was subsequently intubated in the field. His wife states he has not complained of chest pain, palpitations, dizziness or syncope. He has not had LE or abdominal swelling. She feels he is dehydrated and states at his follow-up visit with Dr. Katrinka Blazing on Tuesday he was instructed to hold Lasix. All of his other medications were continued; however, Nathan [name] has not taken any medication in 3 days. On presentation he was hypertensive, 170/90. He is now on IV NTG with improvement. He was also given IV Lasix. His 12-lead ECG shows sinus tachycardia with no acute ST-T wave abnormalities. His troponin is 0.51. He is now on IV heparin.  Past Medical History Past Medical History  Diagnosis Date  . Aorto-iliac disease       bilat with stent 2007  . CAD (coronary artery disease)     with loop to the circumflex 2000, inferolateral infarction  . Hypertension   . Paroxysmal atrial flutter     ablation  . ED (erectile dysfunction)   . COPD (chronic obstructive pulmonary disease)   . Hyperlipidemia   . Stroke   . Shortness of breath   . GERD (gastroesophageal reflux disease)   . Gout     Past Surgical History Past Surgical History  Procedure Date  . Spinal fusion   . Hand surgery   . Neck fusion 1975  . Eye surgery   . Coronary artery bypass graft 10/31/2011    Procedure: CORONARY ARTERY BYPASS GRAFTING (CABG);  Surgeon: Delight Ovens, MD;  Location: [facility] OR;  Service: Open Heart Surgery;  Laterality: N/A;  times three using  Left Internal Mammary Artery and Right Greater Saphenous Vein Graft harvested endoscopically; TEE  . Endarterectomy 10/31/2011    Procedure: ENDARTERECTOMY CAROTID;  Surgeon: Nada Libman, MD;  Location: [facility] OR;  Service: Vascular;  Laterality: Right;  with patch angioplasty   . Carotid endarterectomy 10-31-2011    right     Allergies/Intolerances No Known Allergies  Inpatient Medications    . [COMPLETED] albuterol  5 mg Nebulization Once  . aspirin  324 mg Oral NOW   Or  . aspirin  300 mg Rectal NOW  . [COMPLETED] fentaNYL      . furosemide  40 mg Intravenous BID  . [COMPLETED] ipratropium  0.5 mg Nebulization  Once  . [COMPLETED] midazolam      . pantoprazole sodium  40 mg Per Tube Daily      . sodium chloride    . sodium chloride    . fentaNYL infusion INTRAVENOUS 25 mcg/hr (04/17/12 0609)  . heparin 1,250 Units/hr (04/17/12 0744)  . [COMPLETED] heparin 4,000 Units (04/17/12 0745)  . midazolam (VERSED) infusion 1 mg/hr (04/17/12 1610)   And  . midazolam 2 mg (04/17/12 0555)  . nitroGLYCERIN Stopped (04/17/12 9604)    Family History Family History  Problem Relation Age of Onset  . Cancer Mother      Social History Social History  . Marital Status:  Married   Occupational History  . retired    Social History Main Topics  . Smoking status: Current Some Day Smoker -- 1.0 packs/day for 60 years    Types: Cigarettes  . Smokeless tobacco: Never Used  . 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 Drug Use: No   Review of Systems General: No chills, fever, night sweats or weight changes  Cardiovascular: +SOB  No chest pain, edema, orthopnea, palpitations, paroxysmal nocturnal dyspnea Dermatological: No rash, lesions or masses Respiratory: No cough, dyspnea Urologic: No hematuria, dysuria Abdominal: No nausea, vomiting, diarrhea, bright red blood per rectum, melena, or hematemesis Neurologic: No visual changes, weakness, changes in mental status All other systems reviewed and are otherwise negative except as noted above.  Physical Exam Blood pressure 94/46, pulse 64, temperature 97.9 F (36.6 C), temperature source Rectal, resp. rate 20, height 6' 1.62" (1.87 m), weight 177 lb 7.5 oz (80.5 kg), SpO2 96.00%.  General: Well developed, ill appearing 71 year old male who is intubated and sedated. HEENT: Normocephalic, atraumatic. ET tube in place. Sclera nonicteric.   Neck: Supple. No JVD. Lungs: Mechanical ventilation. Rales bilaterally. No wheezes or rhonchi. Heart: RRR. S1, S2 present. III/VI low pitched systolic murmur, best heard at apex. No diastolic murmur, rub, S3 or S4. Abdomen: Soft, non-tender, non-distended. BS present x 4 quadrants. No hepatosplenomegaly.  Extremities: No clubbing, cyanosis or edema. DP/PT/Radials 2+ and equal bilaterally. Psych: Deferred. Patient intubated and sedated.  Neuro: Unresponsive. Patient intubated and sedated.  Musculoskeletal: No kyphosis. Skin: Intact. Warm and dry. No rashes or petechiae in exposed areas.   Labs  [facility] 04/17/12 0545  CKTOTAL --  CKMB --  TROPONINI 0.50*    Lab Results  Component Value Date   WBC 17.5* 04/17/2012   HGB 11.3* 04/17/2012   HCT 36.9* 04/17/2012   MCV 93.9 04/17/2012   PLT 481* 04/17/2012    Lab 04/17/12 0545  NA 137  K 5.1  CL 104  CO2 22  BUN 22  CREATININE 2.11*  CALCIUM 9.1  PROT --  BILITOT --  ALKPHOS --  ALT --  AST --  GLUCOSE 281*   No components found with this basename: MAGNESIUM No results found for this basename: TSH,T4TOTAL,FREET3,T3FREE,THYROIDAB in the last 72 hours No results found for this basename: INR in the last 72 hours   ProBNP 8088   Radiology/Studies Dg Chest Portable 1 View  04/17/2012  *RADIOLOGY REPORT*  Clinical Data: Intubated.  PORTABLE CHEST - 1 VIEW  Comparison: 04/07/2012  Findings: Worsening bilateral airspace disease, most pronounced in the upper lobes bilaterally.  Prior CABG.  Heart is borderline in size.  Hyperinflation.  Endotracheal tube is in place.  The tip is at the carina or possibly just into the right mainstem bronchus.  This could be retracted 2-3 cm.  Right apical pleural thickening, stable.  IMPRESSION: Worsening bilateral airspace disease, most pronounced in the upper lobes.  Endotracheal tube at the level of the carina are just into the right mainstem bronchus.  Recommend retracting 2-3 cm.  These results were called to Dr. Norlene Campbell at the time of interpretation.   Original Report Authenticated By: Charlett Nose, M.D.    Recent echocardiogram 04/03/2012 Study Conclusions - Left ventricle: The cavity size was mildly dilated. Systolic function was normal. The estimated ejection fraction was in the range of 50% to 55%. Left ventricular diastolic function parameters were normal. - Mitral valve: Mild to moderate regurgitation. - Left atrium: The atrium was mildly dilated. - Right atrium: The atrium was mildly dilated. - Atrial septum: No defect or patent foramen ovale was identified.  Recent cardiac catheterization 04/03/2012 by Dr. Nicki Guadalajara Ao: 135/70  LV: 135/13/26   LM: nl  LAD: 505 in region of DX1 takeoff with brisk filling of DX vessel  LCX: occluded proximally  RCA: diffuse prox and mid 95% stenosis  SVG to RCA: patent  SVG to OM: occluded at origin  SVG to DX1: occluded at origin  LIMA to LAD: patent  EF 50 - 55% with mild mid inferior and inferolateral hypocontractility.   12-lead ECG shows sinus tachycardia at 101 bpm with no acute ST-T wave abnormalities, inferior Q waves present; when compared to prior 12-lead ECG, his anterolateral ST depression is improved and inferior Q waves are old Telemetry shows normal sinus rhythm currently   Assessment and Plan 1. Acute respiratory failure 2. NSTEMI - recent cardiac cath, 2/4 grafts patent, normal LVEF, medical therapy recommended - agree with IV heparin x 48 hours; continue BB and statin 3. Acute on chronic diastolic HF - hypertensive on presentation which has now improved with IV NTG; patient has been noncompliant with antihypertensive medication; agree with IV Lasix with close watch over renal function and BP 4. Leukocytosis - ? etiology;  ? PNA with RUL infiltrate on CXR; per PCCM 5. COPD 6. Medical noncompliance  Dr. Graciela Husbands to see and make further recommendations. Signed, Rick Duff, PA-C 04/17/2012, 8:21 AM

## 2012-04-17 NOTE — Progress Notes (Signed)
Tidal volume increased to 580 post ABG.

## 2012-04-17 NOTE — ED Notes (Signed)
Nurse unable to take report at the time. To call back.  

## 2012-04-17 NOTE — Consult Note (Signed)
Pt with progressive sob and CXR with upper airspace disease leukocytosis and hypotension  JVP unable toassess as bulging but flat, will discuss with CCM re ?  Pneumonia given XRay BNP elevated but 1/2 of what it was 10 days ago Await CVP and will go from there

## 2012-04-17 NOTE — Progress Notes (Signed)
INITIAL ADULT NUTRITION ASSESSMENT Date: 04/17/2012   Time: 10:05 AM  Reason for Assessment: VDRF  INTERVENTION: 1. If pt to remain intubated >24-48 hours, recommend initiation of nutrition support. 2. MD, recommend monitoring magnesium, potassium, and phosphorus daily for at least 3 days, MD to replete as needed, as pt is at risk for refeeding syndrome given recent significant wt loss x 6 months. Unable to obtain further nutrition hx from patient at this time. 3. If enteral nutrition warranted, recommend initiation of Osmolite 1.2 at 20 ml/hr. Advance by 15 ml/hr every 8 hours, or as tolerated, to goal of 55 ml/hr. 30 ml Prostat PO BID. This will provide: 1784 kcal, 104 grams protein, 1082 ml free water.  4. RD to continue to follow nutrition careplan  DOCUMENTATION CODES Per approved criteria  -Not Applicable   ASSESSMENT: Male 71 y.o.  Dx: pulmonary edema  Hx:  Past Medical History  Diagnosis Date  . Aorto-iliac disease     bilat with stent 2007  . CAD (coronary artery disease)     with loop to the circumflex 2000, inferolateral infarction  . Hypertension   . Paroxysmal atrial flutter     ablation  . ED (erectile dysfunction)   . COPD (chronic obstructive pulmonary disease)   . Hyperlipidemia   . Stroke   . Shortness of breath   . GERD (gastroesophageal reflux disease)   . Gout    Past Surgical History  Procedure Date  . Spinal fusion   . Hand surgery   . Neck fusion 1975  . Eye surgery   . Coronary artery bypass graft 10/31/2011    Procedure: CORONARY ARTERY BYPASS GRAFTING (CABG);  Surgeon: Delight Ovens, MD;  Location: [facility] OR;  Service: Open Heart Surgery;  Laterality: N/A;  times three using  Left Internal Mammary Artery and Right Greater Saphenous Vein Graft harvested endoscopically; TEE  . Endarterectomy 10/31/2011    Procedure: ENDARTERECTOMY CAROTID;  Surgeon: Nada Libman, MD;  Location: [facility] OR;  Service: Vascular;  Laterality: Right;  with patch  angioplasty   . Carotid endarterectomy 10-31-2011    right   Related Meds:     . [COMPLETED] albuterol  5 mg Nebulization Once  . allopurinol  200 mg Oral Daily  . aspirin  324 mg Oral NOW   Or  . aspirin  300 mg Rectal NOW  . colchicine  0.6 mg Oral Daily  . [COMPLETED] fentaNYL      . furosemide  40 mg Intravenous BID  . [COMPLETED] heparin  4,000 Units Intravenous Once  . [COMPLETED] ipratropium  0.5 mg Nebulization Once  . metoprolol tartrate  12.5 mg Oral BID  . [COMPLETED] midazolam      . pantoprazole sodium  40 mg Per Tube Daily  . simvastatin  10 mg Oral QHS   Ht: 6' 1.62" (187 cm)  Wt: 177 lb 7.5 oz (80.5 kg)  Ideal Wt: 190 lb/86.4 kg % Ideal Wt: 93%  Wt Readings from Last 15 Encounters:  04/17/12 177 lb 7.5 oz (80.5 kg)  04/08/12 177 lb 7.5 oz (80.5 kg)  04/08/12 177 lb 7.5 oz (80.5 kg)  03/04/12 191 lb 5.8 oz (86.8 kg)  01/15/12 184 lb (83.462 kg)  12/29/11 176 lb (79.833 kg)  12/15/11 179 lb (81.194 kg)  12/01/11 185 lb 9.6 oz (84.188 kg)  11/16/11 194 lb 1.6 oz (88.043 kg)  11/16/11 194 lb 1.6 oz (88.043 kg)  10/29/11 201 lb 4 oz (91.286 kg)  10/23/11 204  lb (92.534 kg)  09/29/11 207 lb (93.895 kg)  09/25/11 210 lb (95.255 kg)  09/15/11 204 lb (92.534 kg)  Usual Wt: 201 - 204 lb; 6 months ago % Usual Wt: 88%; 12% wt loss x 6 months  Body mass index is 23.02 kg/(m^2). Weight is WNL  Labs:  CMP     Component Value Date/Time   NA 137 04/17/2012 0545   K 5.1 04/17/2012 0545   CL 104 04/17/2012 0545   CO2 22 04/17/2012 0545   GLUCOSE 281* 04/17/2012 0545   BUN 22 04/17/2012 0545   CREATININE 2.11* 04/17/2012 0545   CREATININE 1.58* 09/18/2011 1224   CALCIUM 9.1 04/17/2012 0545   PROT 6.6 04/03/2012 0735   ALBUMIN 3.7 04/03/2012 0735   AST 20 04/03/2012 0735   ALT 14 04/03/2012 0735   ALKPHOS 72 04/03/2012 0735   BILITOT 0.3 04/03/2012 0735   GFRNONAA 29* 04/17/2012 0545   GFRAA 34* 04/17/2012 0545   Sodium  Date/Time Value Range Status  04/17/2012   5:45 AM 137  135 - 145 mEq/L Final  04/08/2012  5:00 AM 137  135 - 145 mEq/L Final  04/07/2012  4:25 AM 135  135 - 145 mEq/L Final    Potassium  Date/Time Value Range Status  04/17/2012  5:45 AM 5.1  3.5 - 5.1 mEq/L Final  04/08/2012  5:00 AM 3.5  3.5 - 5.1 mEq/L Final  04/07/2012  4:25 AM 3.4* 3.5 - 5.1 mEq/L Final    Phosphorus  Date/Time Value Range Status  04/07/2012  4:25 AM 3.7  2.3 - 4.6 mg/dL Final  05/17/1094  0:45 AM 3.9  2.3 - 4.6 mg/dL Final  4/0/9811  9:14 AM 3.7  2.3 - 4.6 mg/dL Final    Magnesium  Date/Time Value Range Status  04/07/2012  4:25 AM 2.2  1.5 - 2.5 mg/dL Final  78/29/5621  3:08 AM 1.9  1.5 - 2.5 mg/dL Final  10/14/7844  9:62 AM 2.1  1.5 - 2.5 mg/dL Final   No intake or output data in the 24 hours ending 04/17/12 1007  Diet Order: NPO  Supplements/Tube Feeding: none  IVF:    sodium chloride   fentaNYL infusion INTRAVENOUS Last Rate: 25 mcg/hr (04/17/12 0609)  heparin Last Rate: 1,250 Units/hr (04/17/12 0744)  midazolam (VERSED) infusion Last Rate: 1 mg/hr (04/17/12 0609)  nitroGLYCERIN Last Rate: Stopped (04/17/12 0702)    Estimated Nutritional Needs:   Kcal: 1850 kcal Protein:  100 - 115 grams Fluid: 1.6 - 1.8 liters daily  Pt has been seen by RD during previous hospitalization - noted pt briefly required enteral nutrition and parenteral nutrition s/p CABG in June earlier this year. During that time, pt weighed approximately 200 - 205 lb. Currently down to 177 lb, at weight change of 12% x 6 months. This is significant. Unable to obtain hx from pt at this time regarding this weight loss 2/2 intubated/sedated. No family present at bedside.  No skin breakdown noted. Unable to complete physical assessment at this time as pt receiving care from multiple RNs at this time. Per MD note, will consider TF if pt remains intubated through the weekend. No recent phosphorus or magnesium available.  Patient is currently intubated on ventilator support.  MV:  12.7 Temp:Temp (24hrs), Avg:97.9 F (36.6 C), Min:97.9 F (36.6 C), Max:97.9 F (36.6 C)  Propofol: none  NUTRITION DIAGNOSIS: Inadequate oral intake related to inability to eat as evidenced by NPO status.  MONITORING/EVALUATION(Goals): Goal: If pt to remain intubated >  24-48 hours, recommend initiation of nutrition support. Monitor: weight trends, lab trends, I/O's, vent settings, extubation, initiation of nutrition support  EDUCATION NEEDS: -No education needs identified at this time  Jarold Motto MS, RD, LDN Pager: [phone] After-hours pager: [phone]

## 2012-04-17 NOTE — Progress Notes (Signed)
ANTICOAGULATION CONSULT NOTE - Follow Up Consult  Pharmacy Consult for Heparin  Indication: NSTEMI  No Known Allergies  Patient Measurements: Height: 6' 1.62" (187 cm) Weight: 177 lb 7.5 oz (80.5 kg) IBW/kg (Calculated) : 81.33  Heparin Dosing Weight: 80.5 kg  Vital Signs: Temp: 99 F (37.2 C) (12/07 1600) Temp src: Oral (12/07 1156) BP: 97/51 mmHg (12/07 1600) Pulse Rate: 72  (12/07 1600)  Labs:  Basename 04/17/12 1605 04/17/12 1127 04/17/12 0545  HGB -- -- 11.3*  HCT -- -- 36.9*  PLT -- -- 481*  APTT -- -- --  LABPROT -- -- --  INR -- -- --  HEPARINUNFRC 0.24* -- --  CREATININE 2.43* 2.30* 2.11*  CKTOTAL -- -- --  CKMB -- -- --  TROPONINI -- 0.72* 0.50*    Estimated Creatinine Clearance: 30.8 ml/min (by C-G formula based on Cr of 2.43).  Assessment:   Heparin level is subtherapeutic on 1250 units/hr.  Goal of Therapy:  Heparin level 0.3-0.7 units/ml Monitor platelets by anticoagulation protocol: Yes   Plan:   Increase heparin drip to 1400 units/hr.  Next heparin level in ~ 6 hours.  Daily heparin level and CBC while on heparin.  Dennie Fetters, RPh Pager: [phone] 04/17/2012,5:39 PM

## 2012-04-17 NOTE — H&P (Signed)
PULMONARY  / CRITICAL CARE MEDICINE  Name: Nathan [name] MRN: [id number] DOB: 1940/07/18    LOS: 0  REFERRING MD :  [facility] de[facility]   CHIEF COMPLAINT:  Pulmonary Edema, VDRF  BRIEF PATIENT DESCRIPTION: 71 yo man, CAD, PVD, presumed COPD. Presents with acute resp failure, NSTEMI, apparent pulmonary edema 12/7  LINES / TUBES: ETT 12/7 >>   CULTURES: Blood 12/7 >  Resp cx 12/7 >>  Pneumococcal Ag 12/7 >>   ANTIBIOTICS:  SIGNIFICANT EVENTS:   LEVEL OF CARE:  ICU PRIMARY SERVICE:  PCCM CONSULTANTS:  [facility] CODE STATUS DIET:  Full DVT Px:  Heparin gtt GI Px:  Protonix  HISTORY OF PRESENT ILLNESS:  71 yo man, Hx tobacco and presumed COPD, CAD/CABG 6/13, A Fib, HTN with admission for NSTEMI and hypoxemic resp failure 11/13. Discharged on O2 but wife states that he doesn't wear it. Wife states he was seen by Dr Katrinka Blazing at [facility] week of admission and his lasix was stopped due to drop in weight. She discovered on day PTA that he had not been taking his meds for at least 3 days.  Presented to ED 12/7 am in acute resp distress requiring ETT/MV. Troponin positive. Labs reveal acute on chronic renal insufficiency. Wife states that there has been no fever/chills, no N/V/D or flu-like sx. He has a chronic cough Admitted to ICU.   PAST MEDICAL HISTORY :  Past Medical History  Diagnosis Date  . Aorto-iliac disease     bilat with stent 2007  . CAD (coronary artery disease)     with loop to the circumflex 2000, inferolateral infarction  . Hypertension   . Paroxysmal atrial flutter     ablation  . ED (erectile dysfunction)   . COPD (chronic obstructive pulmonary disease)   . Hyperlipidemia   . Stroke   . Shortness of breath   . GERD (gastroesophageal reflux disease)   . Gout    Past Surgical History  Procedure Date  . Spinal fusion   . Hand surgery   . Neck fusion 1975  . Eye surgery   . Coronary artery bypass graft 10/31/2011    Procedure: CORONARY ARTERY  BYPASS GRAFTING (CABG);  Surgeon: Delight Ovens, MD;  Location: [facility] OR;  Service: Open Heart Surgery;  Laterality: N/A;  times three using  Left Internal Mammary Artery and Right Greater Saphenous Vein Graft harvested endoscopically; TEE  . Endarterectomy 10/31/2011    Procedure: ENDARTERECTOMY CAROTID;  Surgeon: Nada Libman, MD;  Location: [facility] OR;  Service: Vascular;  Laterality: Right;  with patch angioplasty   . Carotid endarterectomy 10-31-2011    right   Prior to Admission medications   Medication Sig Start Date End Date Taking? Authorizing Provider  acetaminophen (TYLENOL) 325 MG tablet Take 650 mg by mouth every 6 (six) hours as needed. For pain   Yes Historical Provider, MD  allopurinol (ZYLOPRIM) 100 MG tablet Take 200 mg by mouth daily.   Yes Historical Provider, MD  amLODipine (NORVASC) 10 MG tablet Take 10 mg by mouth daily.   Yes Historical Provider, MD  aspirin 325 MG tablet Take 325 mg by mouth at bedtime.   Yes Historical Provider, MD  buPROPion (WELLBUTRIN XL) 150 MG 24 hr tablet Take 150 mg by mouth daily.   Yes Historical Provider, MD  colchicine (COLCRYS) 0.6 MG tablet Take 0.6 mg by mouth daily.   Yes Historical Provider, MD  DOXYLAMINE SUCCINATE, SLEEP, PO Take 25 mg by mouth  at bedtime.   Yes Historical Provider, MD  Lactobacillus-Inulin (CULTURELLE DIGESTIVE HEALTH) CAPS Take 1 capsule by mouth daily.   Yes Historical Provider, MD  Melatonin 3 MG TABS Take 2 tablets by mouth at bedtime.   Yes Historical Provider, MD  metoprolol tartrate (LOPRESSOR) 25 MG tablet Take 25 mg by mouth 2 (two) times daily.  11/16/11  Yes Wilmon Pali, PA  nitroGLYCERIN (NITROSTAT) 0.4 MG SL tablet Place 1 tablet (0.4 mg total) under the tongue every 5 (five) minutes x 3 doses as needed for chest pain. 04/07/12  Yes Lyn Records III, MD  omeprazole (PRILOSEC) 20 MG capsule Take 1 capsule (20 mg total) by mouth daily. 1/2 before food 04/07/12  Yes Lyn Records III, MD  simvastatin (ZOCOR)  10 MG tablet Take 10 mg by mouth at bedtime.   Yes Historical Provider, MD   No Known Allergies  FAMILY HISTORY:  Family History  Problem Relation Age of Onset  . Cancer Mother    SOCIAL HISTORY:  reports that he has been smoking Cigarettes.  He has a 60 pack-year smoking history. He has never used smokeless tobacco. He reports that he drinks about 3.6 ounces of alcohol per week. He reports that he does not use illicit drugs.  REVIEW OF SYSTEMS:  Unable to obtain as pt intubated   INTERVAL HISTORY:   VITAL SIGNS: Temp:  [97.9 F (36.6 C)] 97.9 F (36.6 C) (12/07 0627) Pulse Rate:  [64-95] 64  (12/07 0746) Resp:  [15-30] 20  (12/07 0746) BP: (94-126)/(46-59) 94/46 mmHg (12/07 0746) SpO2:  [96 %-100 %] 96 % (12/07 0746) FiO2 (%):  [60 %-100 %] 60 % (12/07 0746) Weight:  [80.5 kg (177 lb 7.5 oz)] 80.5 kg (177 lb 7.5 oz) (12/07 0700) HEMODYNAMICS:   VENTILATOR SETTINGS: Vent Mode:  [-] PRVC FiO2 (%):  [60 %-100 %] 60 % Set Rate:  [20 bmp] 20 bmp Vt Set:  [520 mL-580 mL] 580 mL PEEP:  [5 cmH20] 5 cmH20 Plateau Pressure:  [22 cmH20] 22 cmH20 INTAKE / OUTPUT: Intake/Output    None     PHYSICAL EXAMINATION: General:  Elderly ill-appearing man, sedated Neuro:  Unresponsive to voice on sedation HEENT:  ETT in place, OP clear Cardiovascular:  Regular, 2/6 syst M, 1+ LE edema L>R Lungs:  B insp crackles, no wheezes Abdomen:  Soft, benign Musculoskeletal:  No lesions Skin:  No rash   LABS: Cbc  Lab 04/17/12 0545  WBC 17.5*  HGB 11.3*  HCT 36.9*  PLT 481*   Chemistry   Lab 04/17/12 0545  NA 137  K 5.1  CL 104  CO2 22  BUN 22  CREATININE 2.11*  CALCIUM 9.1  MG --  PHOS --  GLUCOSE 281*    Liver fxn No results found for this basename: AST:3,ALT:3,ALKPHOS:3,BILITOT:3,PROT:3,ALBUMIN:3 in the last 168 hours coags No results found for this basename: APTT:3,INR:3 in the last 168 hours Sepsis markers No results found for this basename:  LATICACIDVEN:3,PROCALCITON:3 in the last 168 hours Cardiac markers  Lab 04/17/12 0545  CKTOTAL --  CKMB --  TROPONINI 0.50*   BNP  Lab 04/17/12 0545  PROBNP 8088.0*   ABG  Lab 04/17/12 0737 04/17/12 0612  PHART 7.291* 7.151*  PCO2ART 48.2* 66.8*  PO2ART 75.0* 275.0*  HCO3 23.2 23.3  TCO2 25 25    CBG trend No results found for this basename: GLUCAP:5 in the last 168 hours  IMAGING: CXR 12/7 >> B diffuse alveolar infiltrates, more consolidated  RUL  ECG: Sinus tachy, no acute ST or T wave changes  DIAGNOSES: Active Problems:  * No active hospital problems. *    ASSESSMENT / PLAN:  PULMONARY  ASSESSMENT:  VDRF due to B infiltrates, presumed acute cardiogenic edema Hx COPD (has not had PFT) PLAN:   - PRVC orders placed - albuterol prn - start SBT's once diuresed and CXR clears  CARDIOVASCULAR  ASSESSMENT:  Acute NSTEMI Acute cardiogenic edema, likely contribution medical non-compliance PLAN:  - heparin gtt and follow troponin - lasix as BP and renal fxn will tolerate - d/c NTG gtt - b-blockade (decrease dose while on sedation) - hold norvasc  - continue statin - ASA - repeat ECG 11/8 [facility] to see  RENAL  ASSESSMENT:   Acute on chronic renal failure Hyperkalemia PLAN:   - careful with lasix, will follow BMP pm 12/7 and in am 12/8 - continue gout regimen with diuresis  GASTROINTESTINAL  ASSESSMENT:   GERD Nutrition PLAN:   - PPI - consider TF if he remains intubated through the weekend   HEMATOLOGIC  ASSESSMENT:   leukocytosis PLAN:  - follow   INFECTIOUS  ASSESSMENT:  No evidence active infxn, infiltrates and hx consistent with CHF PLAN:   - check blood and resp cx's - pneumococcal Ag - hold off on abx unless clinically changes  ENDOCRINE  ASSESSMENT:  No hx DM PLAN:   - CBG's while NPO  NEUROLOGIC  ASSESSMENT:   Sedated PLAN:   - decrease fentanyl + versed, goal transition to intermittent protocol  next 24 hours   I have personally obtained a history, examined the patient, evaluated laboratory and imaging results, formulated the assessment and plan and placed orders.  CRITICAL CARE: The patient is critically ill with multiple organ systems failure and requires high complexity decision making for assessment and support, frequent evaluation and titration of therapies, application of advanced monitoring technologies and extensive interpretation of multiple databases. Critical Care Time devoted to patient care services described in this note is 60 minutes.    Pulmonary and Critical Care Medicine [facility] Pager: [phone]  04/17/2012, 7:50 AM

## 2012-04-17 NOTE — ED Notes (Addendum)
PT. ARRIVED WITH EMS FROM HOME REPORTS PROGRESSING SOB/ EXERTIONAL DYSPNEA ONSET THIS MORNING INITIALLY STARTED CPAP BY EMS PROGRESSED TO AGONAL BREATHING - INTUBATED PRIOR TO ARRIVAL ET 7.0 / RECEIVED LASIX 40 MG IV AND VERSED 5 MG IV , HAS 2 IV LINES ( RIGHT AC/ RIGHT E-J).

## 2012-04-17 NOTE — ED Provider Notes (Signed)
History     CSN: [id number]  Arrival date & time 04/17/12  3086   First MD Initiated Contact with Patient 04/17/12 0543      Chief Complaint  Patient presents with  . Respiratory Distress    (Consider location/radiation/quality/duration/timing/severity/associated sxs/prior treatment) HPI 71 year old male presents to emergency room via EMS with report of respiratory distress. Patient with history of just heart failure, recent admission for an NSTEMI, discharged about a week ago. Patient was taken off his Lasix earlier this week do to "too much weight loss". Wife reports he has not been taking his medications as prescribed since discharge from the hospital, and has not been wearing his oxygen as instructed by his cardiologist. She reports he's been in an "bad mood", and has been keeping to himself over the last week. She reports he woke her from an adjacent bedroom complaining of shortness of breath around 5 AM. Patient was started on CPap I EMS , patient began having agonal respirations and the sats did not improve from 70s upon their arrival. He was intubated with a 702. He received 40 mg of Lasix and 5 mg of Versed. Wife reports he required intubation after having his CABG in June of this year, but has not required it since.  Past Medical History  Diagnosis Date  . Aorto-iliac disease     bilat with stent 2007  . CAD (coronary artery disease)     with loop to the circumflex 2000, inferolateral infarction  . Hypertension   . Paroxysmal atrial flutter     ablation  . ED (erectile dysfunction)   . COPD (chronic obstructive pulmonary disease)   . Hyperlipidemia   . Stroke   . Shortness of breath   . GERD (gastroesophageal reflux disease)   . Gout     Past Surgical History  Procedure Date  . Spinal fusion   . Hand surgery   . Neck fusion 1975  . Eye surgery   . Coronary artery bypass graft 10/31/2011    Procedure: CORONARY ARTERY BYPASS GRAFTING (CABG);  Surgeon: Delight Ovens, MD;  Location: [facility] OR;  Service: Open Heart Surgery;  Laterality: N/A;  times three using  Left Internal Mammary Artery and Right Greater Saphenous Vein Graft harvested endoscopically; TEE  . Endarterectomy 10/31/2011    Procedure: ENDARTERECTOMY CAROTID;  Surgeon: Nada Libman, MD;  Location: [facility] OR;  Service: Vascular;  Laterality: Right;  with patch angioplasty   . Carotid endarterectomy 10-31-2011    right    Family History  Problem Relation Age of Onset  . Cancer Mother     History  Substance Use Topics  . Smoking status: Current Some Day Smoker -- 1.0 packs/day for 60 years    Types: Cigarettes  . Smokeless tobacco: Never Used  . 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  Home Medications   Current Outpatient Rx  Name  Route  Sig  Dispense  Refill  . ACETAMINOPHEN 325 MG PO TABS   Oral   Take 650 mg by mouth every 6 (six) hours as needed. For pain         . ALLOPURINOL 100 MG PO TABS  Oral   Take 200 mg by mouth daily.         Marland Kitchen AMLODIPINE BESYLATE 10 MG PO TABS   Oral   Take 10 mg by mouth daily.         . ASPIRIN 325 MG PO TABS   Oral   Take 325 mg by mouth at bedtime.         . BUPROPION HCL ER (XL) 150 MG PO TB24   Oral   Take 150 mg by mouth daily.         . COLCHICINE 0.6 MG PO TABS   Oral   Take 0.6 mg by mouth daily.         Marland Kitchen DOXYLAMINE SUCCINATE (SLEEP) PO   Oral   Take 25 mg by mouth at bedtime.         . CULTURELLE DIGESTIVE HEALTH PO CAPS   Oral   Take 1 capsule by mouth daily.         Marland Kitchen MELATONIN 3 MG PO TABS   Oral   Take 2 tablets by mouth at bedtime.         Marland Kitchen METOPROLOL TARTRATE 25 MG PO TABS   Oral   Take 25 mg by mouth 2 (two) times daily.          Marland Kitchen  NITROGLYCERIN 0.4 MG SL SUBL   Sublingual   Place 1 tablet (0.4 mg total) under the tongue every 5 (five) minutes x 3 doses as needed for chest pain.   25 tablet   5   . OMEPRAZOLE 20 MG PO CPDR   Oral   Take 1 capsule (20 mg total) by mouth daily. 1/2 before food         . SIMVASTATIN 10 MG PO TABS   Oral   Take 10 mg by mouth at bedtime.           BP 126/59  Pulse 74  Temp 97.9 F (36.6 C) (Rectal)  Resp 15  Ht 6' 1.62" (1.87 m)  Wt 177 lb 7.5 oz (80.5 kg)  BMI 23.02 kg/m2  SpO2 100%  Physical Exam  Nursing note and vitals reviewed. Constitutional: He appears distressed.       Intubated, breathing over bagging assistance, tachypneic  HENT:  Head: Normocephalic and atraumatic.       ETT in place  Eyes: Conjunctivae normal are normal. Pupils are equal, round, and reactive to light.  Neck: JVD present.  Pulmonary/Chest: He is in respiratory distress. He has rales (coarse rales throughout).  Abdominal: Soft. Bowel sounds are normal. He exhibits distension. He exhibits no mass.  Musculoskeletal: He exhibits no edema.  Neurological:       Intubated, sedated  Skin: Skin is warm. No rash noted. He is diaphoretic. No erythema. There is pallor.    ED Course  Procedures (including critical care time)  CRITICAL CARE Performed by: Olivia Mackie   Total critical care time: 60  Critical care time was exclusive of separately billable procedures and treating other patients.  Critical care was necessary to treat or prevent imminent or life-threatening deterioration.  Critical care was time spent personally by me on the following activities: development of treatment plan with patient and/or surrogate as well as nursing, discussions with consultants, evaluation of patient's response to treatment, examination of patient, obtaining history from patient or surrogate, ordering and performing treatments and interventions, ordering and review of laboratory studies, ordering and  review of radiographic studies, pulse oximetry and  re-evaluation of patient's condition.   Labs Reviewed  PRO B NATRIURETIC PEPTIDE - Abnormal; Notable for the following:    Pro B Natriuretic peptide (BNP) 8088.0 (*)     All other components within normal limits  BASIC METABOLIC PANEL - Abnormal; Notable for the following:    Glucose, Bld 281 (*)     Creatinine, Ser 2.11 (*)     GFR calc non Af Amer 29 (*)     GFR calc Af Amer 34 (*)     All other components within normal limits  URINALYSIS, ROUTINE W REFLEX MICROSCOPIC - Abnormal; Notable for the following:    Color, Urine AMBER (*)  BIOCHEMICALS MAY BE AFFECTED BY COLOR   Protein, ur 30 (*)     All other components within normal limits  CBC WITH DIFFERENTIAL - Abnormal; Notable for the following:    WBC 17.5 (*)     RBC 3.93 (*)     Hemoglobin 11.3 (*)     HCT 36.9 (*)     RDW 17.9 (*)     Platelets 481 (*)     Neutro Abs 12.8 (*)     All other components within normal limits  TROPONIN I - Abnormal; Notable for the following:    Troponin I 0.50 (*)     All other components within normal limits  POCT I-STAT 3, BLOOD GAS (G3+) - Abnormal; Notable for the following:    pH, Arterial 7.151 (*)     pCO2 arterial 66.8 (*)     pO2, Arterial 275.0 (*)     Acid-base deficit 6.0 (*)     All other components within normal limits  URINE MICROSCOPIC-ADD ON - Abnormal; Notable for the following:    Casts HYALINE CASTS (*)     All other components within normal limits  POCT I-STAT 3, BLOOD GAS (G3+) - Abnormal; Notable for the following:    pH, Arterial 7.291 (*)     pCO2 arterial 48.2 (*)     pO2, Arterial 75.0 (*)     Acid-base deficit 3.0 (*)     All other components within normal limits  URINALYSIS, MICROSCOPIC ONLY  HEPARIN LEVEL (UNFRACTIONATED)   Dg Chest Portable 1 View  04/17/2012  *RADIOLOGY REPORT*  Clinical Data: Intubated.  PORTABLE CHEST - 1 VIEW  Comparison: 04/07/2012  Findings: Worsening bilateral airspace disease,  most pronounced in the upper lobes bilaterally.  Prior CABG.  Heart is borderline in size.  Hyperinflation.  Endotracheal tube is in place.  The tip is at the carina or possibly just into the right mainstem bronchus.  This could be retracted 2-3 cm.  Right apical pleural thickening, stable.  IMPRESSION: Worsening bilateral airspace disease, most pronounced in the upper lobes.  Endotracheal tube at the level of the carina are just into the right mainstem bronchus.  Recommend retracting 2-3 cm.  These results were called to Dr. Norlene Campbell at the time of interpretation.   Original Report Authenticated By: Charlett Nose, M.D.     Date: 04/17/2012  Rate: 101  Rhythm: sinus tachycardia  QRS Axis: normal  Intervals: normal  ST/T Wave abnormalities: nonspecific ST/T changes  Conduction Disutrbances:none  Narrative Interpretation: rate increased from prior, improved st changes  Old EKG Reviewed: changes noted     1. Respiratory failure, acute-on-chronic   2. Acute on chronic diastolic CHF (congestive heart failure)   3. NSTEMI (non-ST elevated myocardial infarction)   4. Respiratory acidosis   5. Acute kidney injury  MDM  71 year old male with acute on chronic respiratory failure with what sounds to be flash pulmonary edema. Patient has been intubated by EMS. Chest x-ray shows low-lying ET tube, will withdraw slightly. Patient is acidotic with respiratory component. We'll increase his tidal volume. We'll also back off on FiO2 given elevated Po2. Case has been discussed with [facility] cardiology and critical care who will be admitting the patient. Initially hypertensive, this has improved with nitroglycerin drip. Patient has diuresed about 200 cc since placement of the Foley catheter. He has a small bump in his troponin. I have discussed findings and plan with his wife who is at the bedside.        Olivia Mackie, MD 04/17/12 [phone]

## 2012-04-18 ENCOUNTER — Inpatient Hospital Stay (HOSPITAL_COMMUNITY): Payer: Medicare PPO

## 2012-04-18 DIAGNOSIS — N179 Acute kidney failure, unspecified: Secondary | ICD-10-CM

## 2012-04-18 DIAGNOSIS — R4182 Altered mental status, unspecified: Secondary | ICD-10-CM

## 2012-04-18 DIAGNOSIS — I509 Heart failure, unspecified: Secondary | ICD-10-CM

## 2012-04-18 DIAGNOSIS — J9620 Acute and chronic respiratory failure, unspecified whether with hypoxia or hypercapnia: Principal | ICD-10-CM

## 2012-04-18 LAB — BASIC METABOLIC PANEL
BUN: 32 mg/dL — ABNORMAL HIGH (ref 6–23)
BUN: 34 mg/dL — ABNORMAL HIGH (ref 6–23)
CO2: 25 mEq/L (ref 19–32)
Calcium: 8.9 mg/dL (ref 8.4–10.5)
Chloride: 103 mEq/L (ref 96–112)
Creatinine, Ser: 2.73 mg/dL — ABNORMAL HIGH (ref 0.50–1.35)
Creatinine, Ser: 2.52 mg/dL — ABNORMAL HIGH (ref 0.50–1.35)
GFR calc Af Amer: 25 mL/min — ABNORMAL LOW (ref >90)
GFR calc Af Amer: 28 mL/min — ABNORMAL LOW (ref >90)
GFR calc non Af Amer: 22 mL/min — ABNORMAL LOW (ref >90)
Glucose, Bld: 93 mg/dL (ref 70–99)
Glucose, Bld: 82 mg/dL (ref 70–99)
Potassium: 4.3 mEq/L (ref 3.5–5.1)
Potassium: 4.1 mEq/L (ref 3.5–5.1)

## 2012-04-18 LAB — BLOOD GAS, ARTERIAL
Acid-base deficit: 1 mmol/L (ref 0.0–2.0)
Drawn by: 31137
FIO2: 0.5 %
MECHVT: 640 mL
O2 Saturation: 95.5 %
PEEP: 5 cmH2O
RATE: 15 resp/min
pCO2 arterial: 48.2 mmHg — ABNORMAL HIGH (ref 35.0–45.0)
pO2, Arterial: 74.4 mmHg — ABNORMAL LOW (ref 80.0–100.0)

## 2012-04-18 LAB — CBC
HCT: 28.6 % — ABNORMAL LOW (ref 39.0–52.0)
Hemoglobin: 8.9 g/dL — ABNORMAL LOW (ref 13.0–17.0)
MCHC: 31.1 g/dL (ref 30.0–36.0)
MCV: 93.5 fL (ref 78.0–100.0)
RDW: 18 % — ABNORMAL HIGH (ref 11.5–15.5)

## 2012-04-18 LAB — GLUCOSE, CAPILLARY: Glucose-Capillary: 93 mg/dL (ref 70–99)

## 2012-04-18 LAB — STREP PNEUMONIAE URINARY ANTIGEN: Strep Pneumo Urinary Antigen: NEGATIVE

## 2012-04-18 MED ORDER — ASPIRIN 81 MG PO CHEW
81.0000 mg | CHEWABLE_TABLET | Freq: Every day | ORAL | Status: DC
  Administered 2012-04-18 – 2012-04-22 (×5): 81 mg
  Filled 2012-04-18 (×5): qty 1

## 2012-04-18 MED ORDER — SODIUM CHLORIDE 0.9 % IV SOLN
INTRAVENOUS | Status: DC
  Administered 2012-04-18: 10 mL/h via INTRAVENOUS
  Administered 2012-04-19: 09:00:00 via INTRAVENOUS

## 2012-04-18 MED ORDER — DEXTROSE 5 % IV SOLN
500.0000 mg | INTRAVENOUS | Status: DC
  Administered 2012-04-18 – 2012-04-19 (×2): 500 mg via INTRAVENOUS
  Filled 2012-04-18 (×2): qty 500

## 2012-04-18 MED ORDER — FUROSEMIDE 10 MG/ML IJ SOLN
40.0000 mg | Freq: Three times a day (TID) | INTRAMUSCULAR | Status: DC
  Administered 2012-04-18 – 2012-04-19 (×3): 40 mg via INTRAVENOUS
  Filled 2012-04-18 (×6): qty 4

## 2012-04-18 MED ORDER — CHLORHEXIDINE GLUCONATE 0.12 % MT SOLN
15.0000 mL | Freq: Two times a day (BID) | OROMUCOSAL | Status: DC
  Administered 2012-04-18 – 2012-04-21 (×7): 15 mL via OROMUCOSAL
  Filled 2012-04-18 (×9): qty 15

## 2012-04-18 MED ORDER — DEXTROSE 5 % IV SOLN
1.0000 g | INTRAVENOUS | Status: DC
  Filled 2012-04-18: qty 10

## 2012-04-18 MED ORDER — DEXTROSE 5 % IV SOLN
1.0000 g | INTRAVENOUS | Status: DC
  Administered 2012-04-18 – 2012-04-19 (×2): 1 g via INTRAVENOUS
  Filled 2012-04-18 (×2): qty 1

## 2012-04-18 MED ORDER — OSMOLITE 1.2 CAL PO LIQD
1000.0000 mL | ORAL | Status: DC
  Administered 2012-04-18: 1000 mL
  Filled 2012-04-18 (×3): qty 1000

## 2012-04-18 MED ORDER — ACETAMINOPHEN 160 MG/5ML PO SOLN
650.0000 mg | Freq: Four times a day (QID) | ORAL | Status: DC | PRN
  Administered 2012-04-18 – 2012-04-19 (×2): 650 mg
  Filled 2012-04-18 (×2): qty 20.3

## 2012-04-18 NOTE — Progress Notes (Signed)
ANTIBIOTIC CONSULT NOTE - INITIAL  Pharmacy Consult for fortaz Indication: pneumonia  No Known Allergies  Patient Measurements: Height: 6' 1.62" (187 cm) Weight: 176 lb 2.4 oz (79.9 kg) IBW/kg (Calculated) : 81.33    Vital Signs: Temp: 100.2 F (37.9 C) (12/08 1159) Temp src: Core (Comment) (12/08 0600) BP: 111/51 mmHg (12/08 1159) Pulse Rate: 103  (12/08 1159) Intake/Output from previous day: 12/07 0701 - 12/08 0700 In: 769.3 [I.V.:675.3; NG/GT:90; IV Piggyback:4] Out: 1880 [Urine:1880] Intake/Output from this shift:    Labs:  Basename 04/18/12 0520 04/17/12 1605 04/17/12 1127 04/17/12 0545  WBC 11.9* -- -- 17.5*  HGB 8.9* -- -- 11.3*  PLT 276 -- -- 481*  LABCREA -- -- -- --  CREATININE 2.73* 2.43* 2.30* --   Estimated Creatinine Clearance: 27.2 ml/min (by C-G formula based on Cr of 2.73). No results found for this basename: VANCOTROUGH:2,VANCOPEAK:2,VANCORANDOM:2,GENTTROUGH:2,GENTPEAK:2,GENTRANDOM:2,TOBRATROUGH:2,TOBRAPEAK:2,TOBRARND:2,AMIKACINPEAK:2,AMIKACINTROU:2,AMIKACIN:2, in the last 72 hours   Microbiology: Recent Results (from the past 720 hour(s))  MRSA PCR SCREENING     Status: Normal   Collection Time   04/03/12  8:29 AM      Component Value Range Status Comment   MRSA by PCR NEGATIVE  NEGATIVE Final   CULTURE, BLOOD (ROUTINE X 2)     Status: Normal (Preliminary result)   Collection Time   04/17/12  8:05 AM      Component Value Range Status Comment   Specimen Description BLOOD RIGHT HAND   Final    Special Requests BOTTLES DRAWN AEROBIC AND ANAEROBIC 10CC   Final    Culture  Setup Time 04/17/2012 13:22   Final    Culture     Final    Value:        BLOOD CULTURE RECEIVED NO GROWTH TO DATE CULTURE WILL BE HELD FOR 5 DAYS BEFORE ISSUING A FINAL NEGATIVE REPORT   Report Status PENDING   Incomplete   CULTURE, BLOOD (ROUTINE X 2)     Status: Normal (Preliminary result)   Collection Time   04/17/12  8:14 AM      Component Value Range Status Comment   Specimen Description BLOOD LEFT ARM   Final    Special Requests BOTTLES DRAWN AEROBIC ONLY 10CC   Final    Culture  Setup Time 04/17/2012 13:22   Final    Culture     Final    Value:        BLOOD CULTURE RECEIVED NO GROWTH TO DATE CULTURE WILL BE HELD FOR 5 DAYS BEFORE ISSUING A FINAL NEGATIVE REPORT   Report Status PENDING   Incomplete   CULTURE, RESPIRATORY     Status: Normal (Preliminary result)   Collection Time   04/17/12 12:41 PM      Component Value Range Status Comment   Specimen Description TRACHEAL ASPIRATE   Final    Special Requests NONE   Final    Gram Stain PENDING   Incomplete    Culture Culture reincubated for better growth   Final    Report Status PENDING   Incomplete     Medical History: Past Medical History  Diagnosis Date  . Aorto-iliac disease     bilat with stent 2007  . CAD (coronary artery disease)     with loop to the circumflex 2000, inferolateral infarction  . Hypertension   . Paroxysmal atrial flutter     ablation  . ED (erectile dysfunction)   . COPD (chronic obstructive pulmonary disease)   . Hyperlipidemia   . Stroke   .  Shortness of breath   . GERD (gastroesophageal reflux disease)   . Gout    Assessment: All cultures have been negative thus far but patient is febrile with noted yellow secretions.To start Elita Quick and azithromycin today for suspected PNA.  Patient's scr cont to increase with 2.73 this morning (est crcl~ 27).   Plan:  1) fortaz 1gm q24h 2) watch renal function  Judah Chevere P 04/18/2012,12:11 PM

## 2012-04-18 NOTE — Progress Notes (Addendum)
wasted 25 ml of versed in sink per protocol. Verified by 2nd RN Norwood Levo

## 2012-04-18 NOTE — Progress Notes (Signed)
ANTICOAGULATION CONSULT NOTE - Follow Up Consult  Pharmacy Consult for heparin Indication: NSTEMI  Labs:  Basename 04/17/12 2339 04/17/12 1902 04/17/12 1605 04/17/12 1127 04/17/12 0545  HGB -- -- -- -- 11.3*  HCT -- -- -- -- 36.9*  PLT -- -- -- -- 481*  APTT -- -- -- -- --  LABPROT -- -- -- -- --  INR -- -- -- -- --  HEPARINUNFRC 0.24* -- 0.24* -- --  CREATININE -- -- 2.43* 2.30* 2.11*  CKTOTAL -- -- -- -- --  CKMB -- -- -- -- --  TROPONINI -- 0.82* -- 0.72* 0.50*    Assessment: 71yo male remains subtherapeutic on heparin with no movement in level despite rate increase.  Goal of Therapy:  Heparin level 0.3-0.7 units/ml   Plan:  Will increase heparin gtt by ~2-3 units/kg/hr to 1600 units/hr and check level in 8hr.  Colleen Can PharmD BCPS 04/18/2012,12:29 AM

## 2012-04-18 NOTE — Progress Notes (Signed)
Patient ID: Nathan [name], male   DOB: 1940-06-21, 71 y.o.   MRN: [id number]    SUBJECTIVE: Intubated.  Off sedation and wake this morning.  Some diuresis yesterday, no Lasix written for today.  Creatinine is trending up.  Low grade fever.  BP soft.     Marland Kitchen allopurinol  200 mg Oral Daily  . [COMPLETED] aspirin  324 mg Oral NOW   Or  . [COMPLETED] aspirin  300 mg Rectal NOW  . colchicine  0.6 mg Oral Daily  . [EXPIRED] furosemide  40 mg Intravenous BID  . [COMPLETED] furosemide  80 mg Intravenous Once  . metoprolol tartrate  12.5 mg Oral BID  . pantoprazole sodium  40 mg Per Tube Daily  . simvastatin  10 mg Oral QHS  heparin gtt   Filed Vitals:   04/18/12 0400 04/18/12 0500 04/18/12 0600 04/18/12 0758  BP: 102/51 102/49 101/48   Pulse: 84 84 79 89  Temp: 100.4 F (38 C) 100.4 F (38 C) 100 F (37.8 C) 100 F (37.8 C)  TempSrc: Core (Comment) Core (Comment) Core (Comment)   Resp: 20 15 15 17   Height:      Weight:   176 lb 2.4 oz (79.9 kg)   SpO2: 96% 97% 99% 100%    Intake/Output Summary (Last 24 hours) at 04/18/12 0951 Last data filed at 04/18/12 0700  Gross per 24 hour  Intake 769.34 ml  Output   1880 ml  Net -1110.66 ml    LABS: Basic Metabolic Panel:  Basename 04/18/12 0520 04/17/12 1605  NA 141 139  K 4.3 4.7  CL 107 106  CO2 26 24  GLUCOSE 93 86  BUN 32* 27*  CREATININE 2.73* 2.43*  CALCIUM 8.9 8.6  MG 2.0 --  PHOS 5.2* --   Liver Function Tests: No results found for this basename: AST:2,ALT:2,ALKPHOS:2,BILITOT:2,PROT:2,ALBUMIN:2 in the last 72 hours No results found for this basename: LIPASE:2,AMYLASE:2 in the last 72 hours CBC:  Basename 04/18/12 0520 04/17/12 0545  WBC 11.9* 17.5*  NEUTROABS -- 12.8*  HGB 8.9* 11.3*  HCT 28.6* 36.9*  MCV 93.5 93.9  PLT 276 481*   Cardiac Enzymes:  Basename 04/17/12 1902 04/17/12 1127 04/17/12 0545  CKTOTAL -- -- --  CKMB -- -- --  CKMBINDEX -- -- --  TROPONINI 0.82* 0.72* 0.50*   BNP: No  components found with this basename: POCBNP:3 D-Dimer: No results found for this basename: DDIMER:2 in the last 72 hours Hemoglobin A1C: No results found for this basename: HGBA1C in the last 72 hours Fasting Lipid Panel: No results found for this basename: CHOL,HDL,LDLCALC,TRIG,CHOLHDL,LDLDIRECT in the last 72 hours Thyroid Function Tests: No results found for this basename: TSH,T4TOTAL,FREET3,T3FREE,THYROIDAB in the last 72 hours Anemia Panel: No results found for this basename: VITAMINB12,FOLATE,FERRITIN,TIBC,IRON,RETICCTPCT in the last 72 hours  RADIOLOGY: Dg Chest Portable 1 View  04/17/2012  *RADIOLOGY REPORT*  Clinical Data: Intubated.  PORTABLE CHEST - 1 VIEW  Comparison: 04/07/2012  Findings: Worsening bilateral airspace disease, most pronounced in the upper lobes bilaterally.  Prior CABG.  Heart is borderline in size.  Hyperinflation.  Endotracheal tube is in place.  The tip is at the carina or possibly just into the right mainstem bronchus.  This could be retracted 2-3 cm.  Right apical pleural thickening, stable.  IMPRESSION: Worsening bilateral airspace disease, most pronounced in the upper lobes.  Endotracheal tube at the level of the carina are just into the right mainstem bronchus.  Recommend retracting 2-3 cm.  These  results were called to Dr. Norlene Campbell at the time of interpretation.   Original Report Authenticated By: Charlett Nose, M.D.    PHYSICAL EXAM General: NAD Neck: JVP 14-15 cm, no thyromegaly or thyroid nodule.  Lungs: Clear to auscultation bilaterally with normal respiratory effort. CV: Nondisplaced PMI.  Heart regular S1/S2, no S3/S4,3/6 HSM at apex.  No peripheral edema.  Abdomen: Soft, nontender, no hepatosplenomegaly, no distention.  Neurologic: Awake on vent  Extremities: No clubbing or cyanosis.   TELEMETRY: Reviewed telemetry pt in NSR  ASSESSMENT AND PLAN:  71 yo with history of COPD, CAD s/p CABG with preserved EF on last echo, PAD, and prior atrial flutter  ablation presented with dyspnea/hypoxia yesterday and was intubated.  1. CHF: Acute on chronic presumed diastolic CHF.  EF 50-55% on 11/13 echo. Pulmonary edema on CXR, JVP elevated.  Primary symptom at home was shortness of breath (no chest pain).   - Repeat echo for EF given acute decompensation. - Needs diuresis => will start Lasix 40 mg IV every 8 hrs.  However, will need to follow creatinine carefully as it has been rising.   - Does not have IJ CVL, has EJ => CVP on EJ is 12-13 which is likely fairly accurate. 2. CAD: S/p CABG, NSTEMI in 11/13 with cath showing 2/4 grafts patent.  Mild TnI elevation this admission but think this was likely demand ischemia due to volume overload.  Can continue heparin gtt for 48 hrs.  No cath at this time.  Add back ASA, continue statin and metoprolol as BP tolerates.  3. ID: Low grade fever to just over 100 with leukocytosis.  BP also on the low side.  ? Pulmonary edema superimposed on PNA.  Will get blood cultures, PCT.  Favor antibiotics for CAP => CCM to see this morning.   4. Renal: Creatinine rose from 2.3 => 2.7.  He is clearly volume overloaded.  Will use Lasix but need to carefully follow creatinine.   Marca Ancona 04/18/2012 10:03 AM

## 2012-04-18 NOTE — Progress Notes (Signed)
ANTICOAGULATION CONSULT NOTE - Follow Up Consult  Pharmacy Consult for heparin Indication: NSTEMI  No Known Allergies  Patient Measurements: Height: 6' 1.62" (187 cm) Weight: 176 lb 2.4 oz (79.9 kg) IBW/kg (Calculated) : 81.33    Vital Signs: Temp: 100 F (37.8 C) (12/08 0758) Temp src: Core (Comment) (12/08 0600) BP: 101/48 mmHg (12/08 0600) Pulse Rate: 89  (12/08 0758)  Labs:  Basename 04/18/12 0520 04/17/12 2339 04/17/12 1902 04/17/12 1605 04/17/12 1127 04/17/12 0545  HGB 8.9* -- -- -- -- 11.3*  HCT 28.6* -- -- -- -- 36.9*  PLT 276 -- -- -- -- 481*  APTT -- -- -- -- -- --  LABPROT -- -- -- -- -- --  INR -- -- -- -- -- --  HEPARINUNFRC -- 0.24* -- 0.24* -- --  CREATININE 2.73* -- -- 2.43* 2.30* --  CKTOTAL -- -- -- -- -- --  CKMB -- -- -- -- -- --  TROPONINI -- -- 0.82* -- 0.72* 0.50*    Estimated Creatinine Clearance: 27.2 ml/min (by C-G formula based on Cr of 2.73).   Assessment: Patient is a 71 y.o M on heparin for NSTEMI.  Level is therapeutic this morning at 0.46.  No bleeding noted.   Goal of Therapy:  Heparin level 0.3-0.7 units/ml Monitor platelets by anticoagulation protocol: Yes   Plan:  1) no change for heparin  Donelle Hise P 04/18/2012,9:08 AM

## 2012-04-18 NOTE — Progress Notes (Signed)
PULMONARY  / CRITICAL CARE MEDICINE  Name: Nathan [name] MRN: [id number] DOB: 02-14-1941    LOS: 1  REFERRING MD :  [facility] Emergency dept   CHIEF COMPLAINT:  Pulmonary Edema, VDRF  BRIEF PATIENT DESCRIPTION: 71 yo man, CAD, PVD, presumed COPD. Presents with acute resp failure, NSTEMI, apparent pulmonary edema 12/7  LINES / TUBES: ETT 12/7 >>  CULTURES: Blood 12/7 >  Resp cx 12/7 >>  Pneumococcal Ag 12/7 >>   ANTIBIOTICS: 12/8 ceftaz>>> 12/8 azithro>>> 12/8 vanc>>>  SIGNIFICANT EVENTS:  12/8- fever low grade, awake  LEVEL OF CARE:  ICU PRIMARY SERVICE:  PCCM CONSULTANTS:  [employer] CODE STATUS DIET:  Full DVT Px:  Heparin gtt GI Px:  Protonix   INTERVAL HISTORY:  Sr, no pressors, low O2 needs   VITAL SIGNS: Temp:  [97.5 F (36.4 C)-100.4 F (38 C)] 100 F (37.8 C) (12/08 0758) Pulse Rate:  [64-89] 89  (12/08 0758) Resp:  [14-21] 17  (12/08 0758) BP: (83-107)/(44-57) 101/48 mmHg (12/08 0600) SpO2:  [96 %-100 %] 100 % (12/08 0758) FiO2 (%):  [50 %-60 %] 50 % (12/08 0758) Weight:  [79.9 kg (176 lb 2.4 oz)] 79.9 kg (176 lb 2.4 oz) (12/08 0600) HEMODYNAMICS:   VENTILATOR SETTINGS: Vent Mode:  [-] PRVC FiO2 (%):  [50 %-60 %] 50 % Set Rate:  [15 bmp] 15 bmp Vt Set:  [640 mL] 640 mL PEEP:  [5 cmH20] 5 cmH20 Plateau Pressure:  [10 cmH20-24 cmH20] 21 cmH20 INTAKE / OUTPUT: Intake/Output      12/07 0701 - 12/08 0700 12/08 0701 - 12/09 0700   I.V. (mL/kg) 675.3 (8.5)    NG/GT 90    IV Piggyback 4    Total Intake(mL/kg) 769.3 (9.6)    Urine (mL/kg/hr) 1880 (1)    Total Output 1880    Net -1110.7           PHYSICAL EXAMINATION: General:  Elderly ill-appearing man, rass 0 Neuro:  Responsive, nonfocal, cooperative HEENT:  ETT in place, OP clear Cardiovascular:  Regular, 2/6 syst M Lungs:  coars eimproved Abdomen:  Soft, benign Musculoskeletal:  No lesions Skin:  No rash   LABS: Cbc  Lab 04/18/12 0520 04/17/12 0545  WBC 11.9* --  HGB 8.9* 11.3*   HCT 28.6* 36.9*  PLT 276 481*   Chemistry   Lab 04/18/12 0520 04/17/12 1605 04/17/12 1127  NA 141 139 139  K 4.3 4.7 5.2*  CL 107 106 107  CO2 26 24 23   BUN 32* 27* 26*  CREATININE 2.73* 2.43* 2.30*  CALCIUM 8.9 8.6 8.7  MG 2.0 -- --  PHOS 5.2* -- --  GLUCOSE 93 86 93    Liver fxn No results found for this basename: AST:3,ALT:3,ALKPHOS:3,BILITOT:3,PROT:3,ALBUMIN:3 in the last 168 hours coags No results found for this basename: APTT:3,INR:3 in the last 168 hours Sepsis markers  Lab 04/17/12 0809 04/17/12 0803  LATICACIDVEN -- 1.3  PROCALCITON 0.22 --   Cardiac markers  Lab 04/17/12 1902 04/17/12 1127 04/17/12 0545  CKTOTAL -- -- --  CKMB -- -- --  TROPONINI 0.82* 0.72* 0.50*   BNP  Lab 04/18/12 0520 04/17/12 0545  PROBNP 5239.0* 8088.0*   ABG  Lab 04/18/12 0448 04/17/12 0737 04/17/12 0612  PHART 7.323* 7.291* 7.151*  PCO2ART 48.2* 48.2* 66.8*  PO2ART 74.4* 75.0* 275.0*  HCO3 24.3* 23.2 23.3  TCO2 25.7 25 25     CBG trend  Lab 04/18/12 0755 04/17/12 1157  GLUCAP 98 87  IMAGING: CXR 12/8 >> much improved edema, ett wnl  ECG: Sinus tachy, no acute ST or T wave changes  DIAGNOSES: Active Problems:  Hypertension  CAD (coronary artery disease)  Paroxysmal atrial flutter  COPD (chronic obstructive pulmonary disease)  Acute renal failure  Acute on chronic diastolic CHF (congestive heart failure)  NSTEMI (non-ST elevated myocardial infarction)  Pulmonary edema  Respiratory failure, acute-on-chronic   ASSESSMENT / PLAN:  PULMONARY  ASSESSMENT:  VDRF due to B infiltrates, presumed acute cardiogenic edema Hx COPD (has not had PFT) PLAN:   - pcxr major improved -continue neg balance -on minimal support, start SBT, cpap5 ps 5, goal 1 hr -will have R tstart wean, on 50% fine -pcxr in am  -continue same MV  CARDIOVASCULAR  ASSESSMENT:  Acute NSTEMI Acute cardiogenic edema, likely contribution medical non-compliance PLAN:  - heparin gtt  and follow troponin per cards - lasix continjue - b-blockade avoid? - continue statin - ASA -EJ, no accuracy cvp  RENAL  ASSESSMENT:   Acute on chronic renal failure Hyperkalemia PLAN:   - lasix per crads Chem in am  kvo  GASTROINTESTINAL  ASSESSMENT:   GERD Nutrition PLAN:   - PPI - consider TF as likely to not extubate  HEMATOLOGIC  ASSESSMENT:   leukocytosis PLAN:  - follow   INFECTIOUS  ASSESSMENT:  R/o PNA, although pulm edema clearing without ABX, secretions are yellow PLAN:   - recent nosocomial exposure, change cftr to cefta -azithro for atypical , bilaterla BC, sputum -increased fever , add vanc  ENDOCRINE  ASSESSMENT:  No hx DM PLAN:   - CBG's while NPO  NEUROLOGIC  ASSESSMENT:   Sedated PLAN:   - RASS goal met -wua Dc versed drip as goal  I have personally obtained a history, examined the patient, evaluated laboratory and imaging results, formulated the assessment and plan and placed orders.  CRITICAL CARE: The patient is critically ill with multiple organ systems failure and requires high complexity decision making for assessment and support, frequent evaluation and titration of therapies, application of advanced monitoring technologies and extensive interpretation of multiple databases. Critical Care Time devoted to patient care services described in this note is 30 minutes.   Mcarthur Rossetti. Tyson Alias, MD, FACP Pgr: [phone] [facility]

## 2012-04-19 ENCOUNTER — Ambulatory Visit (HOSPITAL_COMMUNITY): Payer: Medicare PPO

## 2012-04-19 ENCOUNTER — Encounter (HOSPITAL_COMMUNITY): Payer: Medicare PPO

## 2012-04-19 DIAGNOSIS — J4489 Other specified chronic obstructive pulmonary disease: Secondary | ICD-10-CM | POA: Insufficient documentation

## 2012-04-19 DIAGNOSIS — Z8673 Personal history of transient ischemic attack (TIA), and cerebral infarction without residual deficits: Secondary | ICD-10-CM | POA: Insufficient documentation

## 2012-04-19 DIAGNOSIS — I6529 Occlusion and stenosis of unspecified carotid artery: Secondary | ICD-10-CM | POA: Insufficient documentation

## 2012-04-19 DIAGNOSIS — R5381 Other malaise: Secondary | ICD-10-CM | POA: Insufficient documentation

## 2012-04-19 DIAGNOSIS — Z5189 Encounter for other specified aftercare: Secondary | ICD-10-CM | POA: Insufficient documentation

## 2012-04-19 DIAGNOSIS — K219 Gastro-esophageal reflux disease without esophagitis: Secondary | ICD-10-CM | POA: Insufficient documentation

## 2012-04-19 DIAGNOSIS — I2510 Atherosclerotic heart disease of native coronary artery without angina pectoris: Secondary | ICD-10-CM | POA: Insufficient documentation

## 2012-04-19 DIAGNOSIS — F17200 Nicotine dependence, unspecified, uncomplicated: Secondary | ICD-10-CM | POA: Insufficient documentation

## 2012-04-19 DIAGNOSIS — N183 Chronic kidney disease, stage 3 unspecified: Secondary | ICD-10-CM | POA: Insufficient documentation

## 2012-04-19 DIAGNOSIS — Z951 Presence of aortocoronary bypass graft: Secondary | ICD-10-CM | POA: Insufficient documentation

## 2012-04-19 DIAGNOSIS — I4891 Unspecified atrial fibrillation: Secondary | ICD-10-CM | POA: Insufficient documentation

## 2012-04-19 DIAGNOSIS — I4892 Unspecified atrial flutter: Secondary | ICD-10-CM | POA: Insufficient documentation

## 2012-04-19 DIAGNOSIS — I252 Old myocardial infarction: Secondary | ICD-10-CM | POA: Insufficient documentation

## 2012-04-19 DIAGNOSIS — J449 Chronic obstructive pulmonary disease, unspecified: Secondary | ICD-10-CM | POA: Insufficient documentation

## 2012-04-19 DIAGNOSIS — E785 Hyperlipidemia, unspecified: Secondary | ICD-10-CM | POA: Insufficient documentation

## 2012-04-19 DIAGNOSIS — E7800 Pure hypercholesterolemia, unspecified: Secondary | ICD-10-CM | POA: Insufficient documentation

## 2012-04-19 DIAGNOSIS — I129 Hypertensive chronic kidney disease with stage 1 through stage 4 chronic kidney disease, or unspecified chronic kidney disease: Secondary | ICD-10-CM | POA: Insufficient documentation

## 2012-04-19 LAB — BASIC METABOLIC PANEL
BUN: 33 mg/dL — ABNORMAL HIGH (ref 6–23)
CO2: 28 mEq/L (ref 19–32)
Calcium: 8.8 mg/dL (ref 8.4–10.5)
Creatinine, Ser: 2.37 mg/dL — ABNORMAL HIGH (ref 0.50–1.35)
GFR calc non Af Amer: 26 mL/min — ABNORMAL LOW (ref >90)
Glucose, Bld: 121 mg/dL — ABNORMAL HIGH (ref 70–99)
Sodium: 137 mEq/L (ref 135–145)

## 2012-04-19 LAB — CBC
HCT: 27.6 % — ABNORMAL LOW (ref 39.0–52.0)
Hemoglobin: 8.6 g/dL — ABNORMAL LOW (ref 13.0–17.0)
MCH: 28.2 pg (ref 26.0–34.0)
MCHC: 31.2 g/dL (ref 30.0–36.0)
MCV: 90.5 fL (ref 78.0–100.0)
RDW: 17.5 % — ABNORMAL HIGH (ref 11.5–15.5)

## 2012-04-19 LAB — GLUCOSE, CAPILLARY: Glucose-Capillary: 140 mg/dL — ABNORMAL HIGH (ref 70–99)

## 2012-04-19 MED ORDER — ACETAMINOPHEN 160 MG/5ML PO SOLN
650.0000 mg | Freq: Four times a day (QID) | ORAL | Status: DC | PRN
Start: 2012-04-19 — End: 2012-04-22
  Administered 2012-04-21: 650 mg via ORAL
  Filled 2012-04-19: qty 20.3

## 2012-04-19 MED ORDER — IPRATROPIUM BROMIDE 0.02 % IN SOLN
0.5000 mg | Freq: Four times a day (QID) | RESPIRATORY_TRACT | Status: DC
  Administered 2012-04-19 – 2012-04-21 (×9): 0.5 mg via RESPIRATORY_TRACT
  Filled 2012-04-19 (×9): qty 2.5

## 2012-04-19 MED ORDER — VANCOMYCIN HCL IN DEXTROSE 1-5 GM/200ML-% IV SOLN
1000.0000 mg | INTRAVENOUS | Status: DC
  Administered 2012-04-19: 1000 mg via INTRAVENOUS
  Filled 2012-04-19 (×2): qty 200

## 2012-04-19 MED ORDER — PANTOPRAZOLE SODIUM 40 MG PO PACK
40.0000 mg | PACK | Freq: Every day | ORAL | Status: DC
  Administered 2012-04-20 – 2012-04-22 (×3): 40 mg via ORAL
  Filled 2012-04-19 (×3): qty 20

## 2012-04-19 MED ORDER — ALBUTEROL SULFATE (5 MG/ML) 0.5% IN NEBU
2.5000 mg | INHALATION_SOLUTION | Freq: Four times a day (QID) | RESPIRATORY_TRACT | Status: DC
  Administered 2012-04-19 – 2012-04-21 (×10): 2.5 mg via RESPIRATORY_TRACT
  Filled 2012-04-19 (×11): qty 0.5

## 2012-04-19 MED ORDER — LEVOFLOXACIN 500 MG PO TABS
500.0000 mg | ORAL_TABLET | Freq: Every day | ORAL | Status: DC
  Filled 2012-04-19: qty 1

## 2012-04-19 MED ORDER — DIPHENHYDRAMINE HCL 50 MG/ML IJ SOLN
25.0000 mg | Freq: Four times a day (QID) | INTRAMUSCULAR | Status: DC | PRN
  Administered 2012-04-19 – 2012-04-22 (×3): 25 mg via INTRAVENOUS
  Filled 2012-04-19 (×3): qty 1

## 2012-04-19 MED ORDER — POTASSIUM CHLORIDE 20 MEQ/15ML (10%) PO LIQD
40.0000 meq | Freq: Once | ORAL | Status: AC
  Administered 2012-04-19: 40 meq
  Filled 2012-04-19: qty 30

## 2012-04-19 MED ORDER — FUROSEMIDE 10 MG/ML IJ SOLN
40.0000 mg | Freq: Two times a day (BID) | INTRAMUSCULAR | Status: DC
  Administered 2012-04-19 – 2012-04-20 (×2): 40 mg via INTRAVENOUS
  Filled 2012-04-19 (×2): qty 4

## 2012-04-19 MED ORDER — ALBUTEROL SULFATE (5 MG/ML) 0.5% IN NEBU: 2.5000 mg | INHALATION_SOLUTION | RESPIRATORY_TRACT | Status: DC | PRN

## 2012-04-19 NOTE — Procedures (Signed)
Extubation Procedure Note  Patient Details:   Name: Nathan [name] DOB: [birth year] MRN: [id number]   Airway Documentation:  Airway 7 mm (Active)  Secured at (cm) 27 cm 04/19/2012  7:35 AM  Measured From Lips 04/19/2012  7:35 AM  Secured Location Center 04/19/2012  7:35 AM  Secured By [employer] 04/19/2012  7:35 AM  Tube Holder Repositioned Yes 04/19/2012  7:35 AM  Cuff Pressure (cm H2O) 38 cm H2O 04/18/2012  7:46 PM  Site Condition Dry 04/19/2012  3:34 AM    Evaluation  O2 sats: stable throughout Complications: No apparent complications Patient did tolerate procedure well. Bilateral Breath Sounds: Rhonchi Suctioning: Airway Yes  Devra Dopp D 04/19/2012, 11:30 AM

## 2012-04-19 NOTE — Progress Notes (Signed)
100 cc of fentanyl wasted from a 250 cc bag of fentanyl. Witnessed per two RN's. Clancy Gourd RN and Herminio Heads RN

## 2012-04-19 NOTE — Progress Notes (Signed)
eLink Physician-Brief Progress Note Patient Name: Nathan [name] DOB: 1941-01-06 MRN: [id number]  Date of Service  04/19/2012   HPI/Events of Note  Hypokalemia in the setting of renal insufficiency   eICU Interventions  Plan: One time dose of 40 mEq KCL via tube.   Intervention Category Intermediate Interventions: Electrolyte abnormality - evaluation and management  DETERDING,ELIZABETH 04/19/2012, 6:33 AM

## 2012-04-19 NOTE — Progress Notes (Signed)
eLink Physician-Brief Progress Note Patient Name: OSBALDO MARK DOB: 07-01-1940 MRN: [id number]  Date of Service  04/19/2012   HPI/Events of Note  Rash worse per RN Ceftaz likely cause   eICU Interventions  Dc ceftaz/ azithro Use levaquin/ vanc for HCAP empiric rx   Intervention Category Major Interventions: Other:  Ambur Province V. 04/19/2012, 8:26 PM

## 2012-04-19 NOTE — Progress Notes (Signed)
Patient Name: Nathan [name] Date of Encounter: 04/19/2012    SUBJECTIVE: The patient is intubated and groggy. We stopped diuretics early last week because he was not eating and was showing evidence of azotemia and hypotension. He was having hypoxia prior to discharge earlier last month but was refusing to wear. He apparently stopped taking all medication by mid week. We were unaware of this.  TELEMETRY:  Sinus tach Filed Vitals:   04/19/12 0500 04/19/12 0700 04/19/12 0735 04/19/12 0800  BP: 126/51 140/56 140/56 146/72  Pulse: 105 108 108 125  Temp: 100.8 F (38.2 C) 102.4 F (39.1 C)  102.4 F (39.1 C)  TempSrc: Core (Comment)     Resp: 24 24 19 28   Height:      Weight: 79.1 kg (174 lb 6.1 oz)     SpO2: 98% 100% 95% 94%    Intake/Output Summary (Last 24 hours) at 04/19/12 0820 Last data filed at 04/19/12 0700  Gross per 24 hour  Intake 1883.5 ml  Output   4550 ml  Net -2666.5 ml    LABS: Basic Metabolic Panel:  Basename 04/19/12 0530 04/18/12 1645 04/18/12 0520  NA 137 139 --  K 3.4* 4.1 --  CL 99 103 --  CO2 28 25 --  GLUCOSE 121* 82 --  BUN 33* 34* --  CREATININE 2.37* 2.52* --  CALCIUM 8.8 9.1 --  MG -- -- 2.0  PHOS -- -- 5.2*   CBC:  Basename 04/19/12 0530 04/18/12 0520 04/17/12 0545  WBC 12.6* 11.9* --  NEUTROABS -- -- 12.8*  HGB 8.6* 8.9* --  HCT 27.6* 28.6* --  MCV 90.5 93.5 --  PLT 256 276 --   Cardiac Enzymes:  Basename 04/17/12 1902 04/17/12 1127 04/17/12 0545  CKTOTAL -- -- --  CKMB -- -- --  CKMBINDEX -- -- --  TROPONINI 0.82* 0.72* 0.50*   BNP: 5200  Radiology/Studies:  CXR with patchy infiltrates that have improved  Physical Exam: Blood pressure 146/72, pulse 125, temperature 102.4 F (39.1 C), temperature source Core (Comment), resp. rate 28, height 6' 1.62" (1.87 m), weight 79.1 kg (174 lb 6.1 oz), SpO2 94.00%. Weight change: -0.8 kg (-1 lb 12.2 oz)   No wheezes Cardiac reveals tachy rate with regular rhythm. Extremities  without edema.  ASSESSMENT:  1. Respiratory failure due to CHF and COPD 2. Acute on CKD 3. Recent NSTEMI with repeat enzyme elevation now likely due to stress 4. COPD with continued smoking and refusal to discontinue even after CABG and last hospital stay. 5. Progressive cognitive impairment since CABG  Plan:  1. Diuresis as tolerated by renal function and BP 2. Extubation as tolerated 3. Overall, poor prognosis  Signed, Lesleigh Noe 04/19/2012, 8:20 AM

## 2012-04-19 NOTE — Progress Notes (Signed)
ANTICOAGULATION CONSULT NOTE - Follow Up Consult  Pharmacy Consult for heparin Indication: NSTEMI  No Known Allergies  Patient Measurements: Height: 6' 1.62" (187 cm) Weight: 174 lb 6.1 oz (79.1 kg) IBW/kg (Calculated) : 81.33   Vital Signs: Temp: 102.4 F (39.1 C) (12/09 0800) Temp src: Core (Comment) (12/09 0500) BP: 146/72 mmHg (12/09 0800) Pulse Rate: 125  (12/09 0800)  Labs:  Basename 04/19/12 0530 04/18/12 1645 04/18/12 0844 04/18/12 0520 04/17/12 2339 04/17/12 1902 04/17/12 1127 04/17/12 0545  HGB 8.6* -- -- 8.9* -- -- -- --  HCT 27.6* -- -- 28.6* -- -- -- 36.9*  PLT 256 -- -- 276 -- -- -- 481*  APTT -- -- -- -- -- -- -- --  LABPROT -- -- -- -- -- -- -- --  INR -- -- -- -- -- -- -- --  HEPARINUNFRC 0.32 -- 0.46 -- 0.24* -- -- --  CREATININE 2.37* 2.52* -- 2.73* -- -- -- --  CKTOTAL -- -- -- -- -- -- -- --  CKMB -- -- -- -- -- -- -- --  TROPONINI -- -- -- -- -- 0.82* 0.72* 0.50*   Estimated Creatinine Clearance: 31.1 ml/min (by C-G formula based on Cr of 2.37).  Assessment: Patient is a 71 y.o M with extensive cardiac history that presents with NSTEMI and hypoxemic respiratory failure.  He was started on IV Heparin and initial troponin levels were positive.  His level is therapeutic this morning at 0.32 and he has no bleeding noted.  His heparin rate is 1600 units/hr.  His CBC reveals a low H/H and platelets that are 256K.   Considering his low hemoglobin, will keep on the low side of the goal range as much as possible.  Goal of Therapy:  Heparin level 0.3-0.7 units/ml Monitor platelets by anticoagulation protocol: Yes   Plan:  1)  Continue IV heparin at 1600 units/hr 2)  F/U AM labs and adjust 3)  Monitor for s/s of bleeding complications  Nadara Mustard, PharmD., MS Clinical Pharmacist Pager:  [phone] Thank you for allowing pharmacy to be part of this patients care team. 04/19/2012,9:14 AM

## 2012-04-19 NOTE — Progress Notes (Signed)
PULMONARY  / CRITICAL CARE MEDICINE  Name: Nathan [name] MRN: [id number] DOB: 04/21/1941    LOS: 2  REFERRING MD :  [facility] Emergency dept   CHIEF COMPLAINT:  Pulmonary Edema, VDRF  BRIEF PATIENT DESCRIPTION: 71 yo male smoker admitted on 04/17/2012 with acute respiratory failure with VDRF, NSTEMI, acute pulmonary edema.  Had admit in November 2013 for NSTEMI>>family reports pt was not complying with medications or supplemental oxygen at home.  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 2013, A fib, HTN, PAD, CVA, GERD, Gout, Hyperlipidemia, mild/mod MR (EF 55% from Echo 04/03/12)  LINES / TUBES: ETT 12/7 >>12/09  CULTURES: Blood 12/7 >  Resp cx 12/7 >>  Pneumococcal Ag 12/7 >> negative  ANTIBIOTICS: 12/8 ceftaz>>>12/09 12/8 azithro>>> 12/8 vanc>>> 12/8 fotraz>>>  SIGNIFICANT EVENTS:  12/08 Fever 12/09 Fever, tolerated SBT  LEVEL OF CARE:  ICU PRIMARY SERVICE:  PCCM CONSULTANTS:  [employer] CODE STATUS: Full DIET:  Full DVT Px:  Heparin gtt GI Px:  Protonix   INTERVAL HISTORY:  Awake, tolerating SBT.   VITAL SIGNS: Temp:  [99.7 F (37.6 C)-102.4 F (39.1 C)] 102.4 F (39.1 C) (12/09 0800) Pulse Rate:  [78-125] 125  (12/09 0800) Resp:  [15-28] 28  (12/09 0800) BP: (82-146)/(32-72) 146/72 mmHg (12/09 0800) SpO2:  [88 %-100 %] 94 % (12/09 0800) FiO2 (%):  [40 %-50 %] 40 % (12/09 0735) Weight:  [174 lb 6.1 oz (79.1 kg)] 174 lb 6.1 oz (79.1 kg) (12/09 0500) HEMODYNAMICS: CVP:  [13 mmHg] 13 mmHg VENTILATOR SETTINGS: Vent Mode:  [-] PSV;CPAP FiO2 (%):  [40 %-50 %] 40 % Set Rate:  [15 bmp] 15 bmp Vt Set:  [640 mL] 640 mL PEEP:  [5 cmH20] 5 cmH20 Pressure Support:  [10 cmH20] 10 cmH20 Plateau Pressure:  [17 cmH20-28 cmH20] 17 cmH20 INTAKE / OUTPUT: Intake/Output      12/08 0701 - 12/09 0700 12/09 0701 - 12/10 0700   I.V. (mL/kg) 779.5 (9.9)    NG/GT 800 60   IV Piggyback 304    Total Intake(mL/kg) 1883.5 (23.8) 60 (0.8)   Urine (mL/kg/hr) 4550 (2.4) 150    Total Output 4550 150   Net -2666.5 -90          PHYSICAL EXAMINATION: General:  No ditress Neuro: Follows commands HEENT:  ETT in place Cardiovascular:  s1s2 regular, 2/6 SM Lungs: prolonged exhalation, no wheeze Abdomen:  Soft, non tender Musculoskeletal:  No edema Skin:  No rash   LABS: Cbc  Lab 04/19/12 0530 04/18/12 0520 04/17/12 0545  WBC 12.6* -- --  HGB 8.6* 8.9* 11.3*  HCT 27.6* 28.6* 36.9*  PLT 256 276 481*   Chemistry   Lab 04/19/12 0530 04/18/12 1645 04/18/12 0520  NA 137 139 141  K 3.4* 4.1 4.3  CL 99 103 107  CO2 28 25 26   BUN 33* 34* 32*  CREATININE 2.37* 2.52* 2.73*  CALCIUM 8.8 9.1 8.9  MG -- -- 2.0  PHOS -- -- 5.2*  GLUCOSE 121* 82 93    Liver fxn No results found for this basename: AST:3,ALT:3,ALKPHOS:3,BILITOT:3,PROT:3,ALBUMIN:3 in the last 168 hours coags No results found for this basename: APTT:3,INR:3 in the last 168 hours Sepsis markers  Lab 04/19/12 0530 04/18/12 1235 04/17/12 0809 04/17/12 0803  LATICACIDVEN -- -- -- 1.3  PROCALCITON 1.39 2.14 0.22 --   Cardiac markers  Lab 04/17/12 1902 04/17/12 1127 04/17/12 0545  CKTOTAL -- -- --  CKMB -- -- --  TROPONINI 0.82* 0.72* 0.50*  BNP  Lab 04/18/12 0520 04/17/12 0545  PROBNP 5239.0* 8088.0*   ABG  Lab 04/18/12 0448 04/17/12 0737 04/17/12 0612  PHART 7.323* 7.291* 7.151*  PCO2ART 48.2* 48.2* 66.8*  PO2ART 74.4* 75.0* 275.0*  HCO3 24.3* 23.2 23.3  TCO2 25.7 25 25     CBG trend  Lab 04/19/12 0514 04/18/12 2355 04/18/12 1600 04/18/12 1227 04/18/12 0755  GLUCAP 132* 115* 93 101* 98    IMAGING: CXR 12/8 >> much improved edema, ett wnl   DIAGNOSES: Active Problems:  Hypertension  CAD (coronary artery disease)  Paroxysmal atrial flutter  COPD (chronic obstructive pulmonary disease)  Acute renal failure  Acute on chronic diastolic CHF (congestive heart failure)  NSTEMI (non-ST elevated myocardial infarction)  Pulmonary edema  Respiratory failure,  acute-on-chronic   ASSESSMENT / PLAN:  PULMONARY  ASSESSMENT:  Acute respiratory failure 2nd to PNA, acute pulmonary edema. Hx of smoking with reported hx of COPD. PLAN:   Proceed with extubation 12/09 BiPAP prn after extubation Adjust oxygen to keep SpO2 > 90% Continue scheduled BD's after extubation May need further assessment for COPD as outpt Needs to stop smoking F/u CXR 12/10  CARDIOVASCULAR  ASSESSMENT:  NSTEMI with acute pulmonary edema likely 2nd to demand ischemia from PNA. Hx of CAD, HTN, A fib, PAD, Hyperlipidemia, Mitral regurgitation.  PLAN:  Heparin gtt per cardiology Continue IV lasix to keep in negative fluid balance Continue ASA, simvastatin Okay to resume lopressor >> defer timing to cardiology  RENAL  ASSESSMENT:   Acute on chronic renal failure. Hyperkalemia. Resolved. PLAN:   Monitor renal fx, urine outpt, electrolytes  GASTROINTESTINAL  ASSESSMENT:   Hx of GERD. Nutrition. PLAN:   Continue protonix Advance diet as tolerated after extubation  HEMATOLOGIC  ASSESSMENT:   Anemia or critical illness.  PLAN:  F/u CBC  INFECTIOUS  ASSESSMENT:   PNA with recent hospital admit. Persistent fever 12/09.  PLAN:   Continue fortaz, zithromax, vancomycin   ENDOCRINE  ASSESSMENT:   Hx of Gout.  PLAN:   Continue allopurinol, colchicine  NEUROLOGIC  ASSESSMENT:   Sedation. PLAN:   D/c sedation protocol after extubation  DERMATOLOGY  ASSESSMENT: Pruritis. No obvious rash.  PLAN: PRN benadryl  Critical care time 35 minutes.  Coralyn Helling, MD [facility] Pulmonary/Critical Care 04/19/2012, 9:43 AM Pager:  [phone] After 3pm call: [phone]

## 2012-04-19 NOTE — Progress Notes (Signed)
Nutrition Follow-up  Intervention:   1. RD will follow po intake for needs.  Assessment:   Pt extubated this morning, TF off with extubation. Diet advanced to heart healthy, no meals documented at this time.   Diet Order:  heart  Meds: Scheduled Meds:   . albuterol  2.5 mg Nebulization Q6H  . allopurinol  200 mg Oral Daily  . aspirin  81 mg Per Tube Daily  . azithromycin  500 mg Intravenous Q24H  . cefTAZidime (FORTAZ)  IV  1 g Intravenous Q24H  . chlorhexidine  15 mL Mouth/Throat BID  . colchicine  0.6 mg Oral Daily  . furosemide  40 mg Intravenous Q12H  . ipratropium  0.5 mg Nebulization Q6H  . pantoprazole sodium  40 mg Oral Daily  . [COMPLETED] potassium chloride  40 mEq Per Tube Once  . simvastatin  10 mg Oral QHS  . vancomycin  1,000 mg Intravenous Q24H  . [DISCONTINUED] furosemide  40 mg Intravenous Q8H  . [DISCONTINUED] pantoprazole sodium  40 mg Per Tube Daily   Continuous Infusions:   . sodium chloride 10 mL/hr at 04/18/12 1000  . sodium chloride 10 mL/hr at 04/19/12 0838  . heparin 1,600 Units/hr (04/19/12 0805)  . nitroGLYCERIN Stopped (04/17/12 1610)  . [DISCONTINUED] feeding supplement (OSMOLITE 1.2 CAL) 1,000 mL (04/18/12 1600)  . [DISCONTINUED] fentaNYL infusion INTRAVENOUS Stopped (04/19/12 0804)   PRN Meds:.sodium chloride, acetaminophen (TYLENOL) oral liquid 160 mg/5 mL, albuterol, diphenhydrAMINE, [DISCONTINUED] acetaminophen (TYLENOL) oral liquid 160 mg/5 mL, [DISCONTINUED] albuterol, [DISCONTINUED] fentaNYL   CMP     Component Value Date/Time   NA 137 04/19/2012 0530   K 3.4* 04/19/2012 0530   CL 99 04/19/2012 0530   CO2 28 04/19/2012 0530   GLUCOSE 121* 04/19/2012 0530   BUN 33* 04/19/2012 0530   CREATININE 2.37* 04/19/2012 0530   CREATININE 1.58* 09/18/2011 1224   CALCIUM 8.8 04/19/2012 0530   PROT 6.6 04/03/2012 0735   ALBUMIN 3.7 04/03/2012 0735   AST 20 04/03/2012 0735   ALT 14 04/03/2012 0735   ALKPHOS 72 04/03/2012 0735   BILITOT 0.3  04/03/2012 0735   GFRNONAA 26* 04/19/2012 0530   GFRAA 30* 04/19/2012 0530    CBG (last 3)   Basename 04/19/12 1211 04/19/12 0514 04/18/12 2355  GLUCAP 140* 132* 115*     Intake/Output Summary (Last 24 hours) at 04/19/12 1307 Last data filed at 04/19/12 1200  Gross per 24 hour  Intake   1847 ml  Output   4538 ml  Net  -2691 ml    Weight Status:  174 lbs, down 3 lbs with fluids off   Re-estimated needs:  1900-2100 kcal, 80-90 gm   Nutrition Dx:  Inadequate oral intake related to recent diet advance post extubation as evidenced by no meals documented at this time.   Goal:  EN goal no longer applicable.  New Goal: PO intake to meet >/=90% estimated nutrition needs  Monitor:  PO intake, weight, labs, I/O's   Clarene Duke RD, LDN Pager [phone] After Hours pager [phone]

## 2012-04-19 NOTE — Care Management Note (Signed)
    Page 1 of 1   04/19/2012     1:18:53 PM   CARE MANAGEMENT NOTE 04/19/2012  Patient:  Nathan [name], Nathan [name]   Account Number:  [id number]  Date Initiated:  04/19/2012  Documentation initiated by:  Junius Creamer  Subjective/Objective Assessment:   adm w resp distress, chf     Action/Plan:   lives  w wife, pcp dr Jonny Ruiz griffin   Anticipated DC Date:     Anticipated DC Plan:        DC Planning Services  CM consult      Choice offered to / List presented to:             Status of service:   Medicare Important Message given?   (If response is "NO", the following Medicare IM given date fields will be blank) Date Medicare IM given:   Date Additional Medicare IM given:    Discharge Disposition:    Per UR Regulation:  Reviewed for med. necessity/level of care/duration of stay  If discussed at Long Length of Stay Meetings, dates discussed:    Comments:  12/9 13:18p debbie Thomasina Housley rn,bsn [phone]

## 2012-04-19 NOTE — Progress Notes (Addendum)
ANTIBIOTIC CONSULT NOTE - Follow Up  Pharmacy Consult for Ceftazidime and Vancomycin Indication: pneumonia  No Known Allergies  Patient Measurements: Height: 6' 1.62" (187 cm) Weight: 174 lb 6.1 oz (79.1 kg) IBW/kg (Calculated) : 81.33   Vital Signs: Temp: 101.3 F (38.5 C) (12/09 0900) Temp src: Core (Comment) (12/09 0500) BP: 118/89 mmHg (12/09 0900) Pulse Rate: 111  (12/09 0900) Intake/Output from previous day: 12/08 0701 - 12/09 0700 In: 1883.5 [I.V.:779.5; NG/GT:800; IV Piggyback:304] Out: 4550 [Urine:4550] Intake/Output from this shift: Total I/O In: 192 [I.V.:52; NG/GT:140] Out: 182 [Urine:182]  Labs:  [facility] 04/19/12 0530 04/18/12 1645 04/18/12 0520 04/17/12 0545  WBC 12.6* -- 11.9* 17.5*  HGB 8.6* -- 8.9* 11.3*  PLT 256 -- 276 481*  LABCREA -- -- -- --  CREATININE 2.37* 2.52* 2.73* --   Estimated Creatinine Clearance: 31.1 ml/min (by C-G formula based on Cr of 2.37). No results found for this basename: VANCOTROUGH:2,VANCOPEAK:2,VANCORANDOM:2,GENTTROUGH:2,GENTPEAK:2,GENTRANDOM:2,TOBRATROUGH:2,TOBRAPEAK:2,TOBRARND:2,AMIKACINPEAK:2,AMIKACINTROU:2,AMIKACIN:2, in the last 72 hours   Microbiology: Recent Results (from the past 720 hour(s))  MRSA PCR SCREENING     Status: Normal   Collection Time   04/03/12  8:29 AM      Component Value Range Status Comment   MRSA by PCR NEGATIVE  NEGATIVE Final   CULTURE, BLOOD (ROUTINE X 2)     Status: Normal (Preliminary result)   Collection Time   04/17/12  8:05 AM      Component Value Range Status Comment   Specimen Description BLOOD RIGHT HAND   Final    Special Requests BOTTLES DRAWN AEROBIC AND ANAEROBIC 10CC   Final    Culture  Setup Time 04/17/2012 13:22   Final    Culture     Final    Value:        BLOOD CULTURE RECEIVED NO GROWTH TO DATE CULTURE WILL BE HELD FOR 5 DAYS BEFORE ISSUING A FINAL NEGATIVE REPORT   Report Status PENDING   Incomplete   CULTURE, BLOOD (ROUTINE X 2)     Status: Normal (Preliminary result)    Collection Time   04/17/12  8:14 AM      Component Value Range Status Comment   Specimen Description BLOOD LEFT ARM   Final    Special Requests BOTTLES DRAWN AEROBIC ONLY 10CC   Final    Culture  Setup Time 04/17/2012 13:22   Final    Culture     Final    Value:        BLOOD CULTURE RECEIVED NO GROWTH TO DATE CULTURE WILL BE HELD FOR 5 DAYS BEFORE ISSUING A FINAL NEGATIVE REPORT   Report Status PENDING   Incomplete   CULTURE, RESPIRATORY     Status: Normal (Preliminary result)   Collection Time   04/17/12 12:41 PM      Component Value Range Status Comment   Specimen Description TRACHEAL ASPIRATE   Final    Special Requests NONE   Final    Gram Stain PENDING   Incomplete    Culture Culture reincubated for better growth   Final    Report Status PENDING   Incomplete   CULTURE, BLOOD (ROUTINE X 2)     Status: Normal (Preliminary result)   Collection Time   04/18/12 12:41 PM      Component Value Range Status Comment   Specimen Description BLOOD LEFT ARM   Final    Special Requests BOTTLES DRAWN AEROBIC AND ANAEROBIC 10CC   Final    Culture  Setup Time 04/18/2012 20:58  Final    Culture     Final    Value:        BLOOD CULTURE RECEIVED NO GROWTH TO DATE CULTURE WILL BE HELD FOR 5 DAYS BEFORE ISSUING A FINAL NEGATIVE REPORT   Report Status PENDING   Incomplete   CULTURE, BLOOD (ROUTINE X 2)     Status: Normal (Preliminary result)   Collection Time   04/18/12 12:49 PM      Component Value Range Status Comment   Specimen Description BLOOD LEFT ARM   Final    Special Requests BOTTLES DRAWN AEROBIC ONLY 10CC   Final    Culture  Setup Time 04/18/2012 20:57   Final    Culture     Final    Value:        BLOOD CULTURE RECEIVED NO GROWTH TO DATE CULTURE WILL BE HELD FOR 5 DAYS BEFORE ISSUING A FINAL NEGATIVE REPORT   Report Status PENDING   Incomplete     Medical History: Past Medical History  Diagnosis Date  . Aorto-iliac disease     bilat with stent 2007  . CAD (coronary artery  disease)     with loop to the circumflex 2000, inferolateral infarction  . Hypertension   . Paroxysmal atrial flutter     ablation  . ED (erectile dysfunction)   . COPD (chronic obstructive pulmonary disease)   . Hyperlipidemia   . Stroke   . Shortness of breath   . GERD (gastroesophageal reflux disease)   . Gout    Assessment: All cultures have been negative thus far but patient is febrile with noted yellow secretions.  He was started on  Ceftazidime and azithromycin yesterday for suspected PNA.  To broaden coverage today to include Vancomycin for persistent fever despite IV antibiotics.  Patient has some renal dysfunction but creatinine has improved today to 2.37 with an estimated clearance of ~ 4ml/min, with good UOP yesterday and a negative balance.  WBC is 12.6K which is an upward trend.  He remains ventilated, placing him at increased risk for infection.  Goal: Vancomycin trough 15-20 mcg/ml  Plan:  1)   Continue Ceftazidime 1gm q24h today - may be able to adjust upward if his renal function continues to improve. 2)   Begin IV Vancomycin 1 gm every 24 hours 3)   Watch renal function and adjust as needed. 4)   Check s/s levels  Nadara Mustard, PharmD., MS Clinical Pharmacist Pager:  [phone] Thank you for allowing pharmacy to be part of this patients care team. 04/19/2012,10:14 AM

## 2012-04-20 ENCOUNTER — Inpatient Hospital Stay (HOSPITAL_COMMUNITY): Payer: Medicare PPO

## 2012-04-20 DIAGNOSIS — J411 Mucopurulent chronic bronchitis: Secondary | ICD-10-CM

## 2012-04-20 DIAGNOSIS — F17200 Nicotine dependence, unspecified, uncomplicated: Secondary | ICD-10-CM

## 2012-04-20 LAB — CBC
MCV: 90.4 fL (ref 78.0–100.0)
Platelets: 284 10*3/uL (ref 150–400)
RBC: 3.43 MIL/uL — ABNORMAL LOW (ref 4.22–5.81)
RDW: 17 % — ABNORMAL HIGH (ref 11.5–15.5)
WBC: 9 10*3/uL (ref 4.0–10.5)

## 2012-04-20 LAB — CULTURE, RESPIRATORY W GRAM STAIN

## 2012-04-20 LAB — HEPARIN LEVEL (UNFRACTIONATED): Heparin Unfractionated: 0.15 [IU]/mL — ABNORMAL LOW (ref 0.30–0.70)

## 2012-04-20 LAB — BASIC METABOLIC PANEL WITH GFR
BUN: 26 mg/dL — ABNORMAL HIGH (ref 6–23)
CO2: 31 meq/L (ref 19–32)
Calcium: 9.6 mg/dL (ref 8.4–10.5)
Chloride: 97 meq/L (ref 96–112)
Creatinine, Ser: 2.05 mg/dL — ABNORMAL HIGH (ref 0.50–1.35)
GFR calc Af Amer: 35 mL/min — ABNORMAL LOW
GFR calc non Af Amer: 30 mL/min — ABNORMAL LOW
Glucose, Bld: 104 mg/dL — ABNORMAL HIGH (ref 70–99)
Potassium: 3.7 meq/L (ref 3.5–5.1)
Sodium: 141 meq/L (ref 135–145)

## 2012-04-20 MED ORDER — METOPROLOL TARTRATE 1 MG/ML IV SOLN
2.5000 mg | INTRAVENOUS | Status: DC | PRN
  Administered 2012-04-20 – 2012-04-21 (×2): 5 mg via INTRAVENOUS
  Filled 2012-04-20: qty 5

## 2012-04-20 MED ORDER — LEVOFLOXACIN 250 MG PO TABS
250.0000 mg | ORAL_TABLET | Freq: Every day | ORAL | Status: DC
  Administered 2012-04-21: 250 mg via ORAL
  Filled 2012-04-20 (×2): qty 1

## 2012-04-20 MED ORDER — LEVOFLOXACIN 500 MG PO TABS
500.0000 mg | ORAL_TABLET | Freq: Once | ORAL | Status: AC
  Administered 2012-04-20: 500 mg via ORAL
  Filled 2012-04-20: qty 1

## 2012-04-20 MED ORDER — SPIRONOLACTONE 12.5 MG HALF TABLET
12.5000 mg | ORAL_TABLET | Freq: Two times a day (BID) | ORAL | Status: DC
  Administered 2012-04-20 – 2012-04-22 (×5): 12.5 mg via ORAL
  Filled 2012-04-20 (×10): qty 1

## 2012-04-20 MED ORDER — METOPROLOL TARTRATE 1 MG/ML IV SOLN
INTRAVENOUS | Status: AC
  Administered 2012-04-21: 5 mg via INTRAVENOUS
  Filled 2012-04-20: qty 5

## 2012-04-20 MED ORDER — ENOXAPARIN SODIUM 40 MG/0.4ML ~~LOC~~ SOLN
40.0000 mg | SUBCUTANEOUS | Status: DC
  Administered 2012-04-20 – 2012-04-22 (×3): 40 mg via SUBCUTANEOUS
  Filled 2012-04-20 (×3): qty 0.4

## 2012-04-20 MED ORDER — FUROSEMIDE 40 MG PO TABS
40.0000 mg | ORAL_TABLET | Freq: Two times a day (BID) | ORAL | Status: DC
  Administered 2012-04-20 – 2012-04-21 (×2): 40 mg via ORAL
  Filled 2012-04-20 (×5): qty 1

## 2012-04-20 NOTE — Progress Notes (Signed)
ANTICOAGULATION CONSULT NOTE - Follow Up Consult  Pharmacy Consult for heparin Indication: NSTEMI  No Known Allergies  Patient Measurements: Height: 6' 1.62" (187 cm) Weight: 174 lb 6.1 oz (79.1 kg) IBW/kg (Calculated) : 81.33   Vital Signs: Temp: 99.1 F (37.3 C) (12/10 0700) BP: 127/54 mmHg (12/10 0700) Pulse Rate: 95  (12/10 0700)  Labs:  Basename 04/20/12 0525 04/19/12 0530 04/18/12 1645 04/18/12 0844 04/18/12 0520 04/17/12 1902 04/17/12 1127  HGB 9.6* 8.6* -- -- -- -- --  HCT 31.0* 27.6* -- -- 28.6* -- --  PLT 284 256 -- -- 276 -- --  APTT -- -- -- -- -- -- --  LABPROT -- -- -- -- -- -- --  INR -- -- -- -- -- -- --  HEPARINUNFRC 0.15* 0.32 -- 0.46 -- -- --  CREATININE 2.05* 2.37* 2.52* -- -- -- --  CKTOTAL -- -- -- -- -- -- --  CKMB -- -- -- -- -- -- --  TROPONINI -- -- -- -- -- 0.82* 0.72*   Estimated Creatinine Clearance: 35.9 ml/min (by C-G formula based on Cr of 2.05).  Assessment: Patient is a 71 y.o M with extensive cardiac history that presents with NSTEMI and hypoxemic respiratory failure.  He was started on IV Heparin and initial troponin levels were positive.  His level is sub-therapeutic this morning at 0.15.  Spoke with his nurse and she reports no infusion issues as well as no noted bleeding complications.  His CBC has trended up and he has had improvement in his renal function.  Not sure why the variability unless his requirements are just higher.  Goal of Therapy:  Heparin level 0.3-0.7 units/ml Monitor platelets by anticoagulation protocol: Yes   Plan:  1)  Will increase IV heparin at 1750 units/hr 2)  F/U 8 hour level and adjust 3)  Monitor for s/s of bleeding complications  Nadara Mustard, PharmD., MS Clinical Pharmacist Pager:  [phone] Thank you for allowing pharmacy to be part of this patients care team. 04/20/2012,8:45 AM

## 2012-04-20 NOTE — Progress Notes (Signed)
Physical Therapy Evaluation Patient Details Name: Nathan [name] MRN: [id number] DOB: 13-Nov-1940 Today's Date: 04/20/2012 Time: 4098-1191 PT Time Calculation (min): 29 min  PT Assessment / Plan / Recommendation Clinical Impression  71 yo male admitted with VDRF (likely secondary to medical non-compliance), extubated 12/9; Presents with functional deficits including decr activity tol, gait and balance dysfunction, and impulsivity leading to somewhat higher fall risk; Will benefit from PT to maximize safety with mobility, amb activity tol, and enable safe dc home    PT Assessment  Patient needs continued PT services    Follow Up Recommendations  Home health PT;Supervision - Intermittent    Does the patient have the potential to tolerate intense rehabilitation      Barriers to Discharge Decreased caregiver support Wife provides intermittent supervision    Equipment Recommendations  None recommended by PT    Recommendations for Other Services     Frequency Min 3X/week    Precautions / Restrictions Precautions Precautions: Fall   Pertinent Vitals/Pain Session conducted on supplemental O2 3 L, sats remained greater than or equal to 94% throughout      Mobility  Bed Mobility Bed Mobility: Supine to Sit;Sit to Supine Supine to Sit: 7: Independent Sit to Supine: 7: Independent Details for Bed Mobility Assistance: Smooth transition Transfers Transfers: Sit to Stand;Stand to Sit Sit to Stand: 4: Min guard;From bed (without physical contact) Stand to Sit: 4: Min guard Details for Transfer Assistance: Smooth transition with adequate strength in LEs; Noted some unsteadiness at initial stand, warranting minguard assist Ambulation/Gait Ambulation/Gait Assistance: 4: Min guard Ambulation Distance (Feet): 3 Feet (mostly march in place at bedside) Assistive device: None Ambulation/Gait Assistance Details: Pt performed high stepping during march in place at bedside; Impulsive, and  noted 1 loss of balance with which pt needed to reach for UE support to steady self again    Shoulder Instructions     Exercises     PT Diagnosis: Abnormality of gait;Difficulty walking  PT Problem List: Decreased balance;Decreased activity tolerance PT Treatment Interventions: Gait training;Functional mobility training;Therapeutic activities;Therapeutic exercise;Balance training;Stair training;Patient/family education   PT Goals Acute Rehab PT Goals PT Goal Formulation: With patient Time For Goal Achievement: 05/04/12 Potential to Achieve Goals: Good Pt will go Sit to Stand: Independently PT Goal: Sit to Stand - Progress: Goal set today Pt will go Stand to Sit: Independently PT Goal: Stand to Sit - Progress: Goal set today Pt will Ambulate: >150 feet;Independently;with gait velocity >(comment) ft/second (2.62) PT Goal: Ambulate - Progress: Goal set today Pt will Go Up / Down Stairs: 3-5 stairs;with modified independence;with rail(s) PT Goal: Up/Down Stairs - Progress: Goal set today Additional Goals Additional Goal #1: Pt will demonstrate decreased risk of falls with DGI >/=20/24.  PT Goal: Additional Goal #1 - Progress: Goal set today  Visit Information  Last PT Received On: 04/20/12 Assistance Needed: +1    Subjective Data  Subjective: Wants to get home and spend time on porch when weather is better Patient Stated Goal: home   Prior Functioning  Home Living Lives With: Spouse Available Help at Discharge: Available PRN/intermittently;Other (Comment) (wife works intermittently during the day) Type of Home: Mobile home Home Access: Stairs to enter [employer] of Steps: 5 Entrance Stairs-Rails: Can reach both Home Layout: One level Bathroom Shower/Tub: Forensic scientist: Standard Home Adaptive Equipment: Walker - rolling;Bedside commode/3-in-1;Shower chair without back;Other (comment) Prior Function Level of Independence:  Independent Able to Take Stairs?: Yes Driving: Yes Vocation: Retired Musician: No  difficulties Dominant Hand: Right    Cognition  Overall Cognitive Status: Impaired Area of Impairment: Memory;Safety/judgement Arousal/Alertness: Awake/alert Orientation Level: Appears intact for tasks assessed Behavior During Session: [facility] for tasks performed Safety/Judgement: Decreased safety judgement for tasks assessed;Impulsive Cognition - Other Comments: Noted questionable onset of dementia    Extremity/Trunk Assessment Right Upper Extremity Assessment RUE ROM/Strength/Tone: Within functional levels Left Upper Extremity Assessment LUE ROM/Strength/Tone: Within functional levels Right Lower Extremity Assessment RLE ROM/Strength/Tone: WFL for tasks assessed Left Lower Extremity Assessment LLE ROM/Strength/Tone: WFL for tasks assessed Trunk Assessment Trunk Assessment: Normal   Balance    End of Session PT - End of Session Equipment Utilized During Treatment: Gait belt Activity Tolerance: Patient tolerated treatment well Patient left: in bed;with call bell/phone within reach (wanting to nap) Nurse Communication: Mobility status  GP     Van Clines [facility] [place], [place] [phone]  04/20/2012, 4:07 PM

## 2012-04-20 NOTE — Progress Notes (Signed)
ANTIBIOTIC CONSULT NOTE - Follow Up  Pharmacy Consult for Ceftazidime and Vancomycin Indication: pneumonia  Patient Measurements: Height: 6' 1.62" (187 cm) Weight: 174 lb 6.1 oz (79.1 kg) IBW/kg (Calculated) : 81.33   Vital Signs: Temp: 99.1 F (37.3 C) (12/10 0700) BP: 127/54 mmHg (12/10 0700) Pulse Rate: 95  (12/10 0700) Intake/Output from previous day: 12/09 0701 - 12/10 0700 In: 1806 [P.O.:440; I.V.:672; NG/GT:140; IV Piggyback:554] Out: 2663 [Urine:2663]  Labs:  Basename 04/20/12 0525 04/19/12 0530 04/18/12 1645 04/18/12 0520  WBC 9.0 12.6* -- 11.9*  HGB 9.6* 8.6* -- 8.9*  PLT 284 256 -- 276  LABCREA -- -- -- --  CREATININE 2.05* 2.37* 2.52* --   Estimated Creatinine Clearance: 35.9 ml/min (by C-G formula based on Cr of 2.05).  Microbiology: Recent Results (from the past 720 hour(s))  MRSA PCR SCREENING     Status: Normal   Collection Time   04/03/12  8:29 AM      Component Value Range Status Comment   MRSA by PCR NEGATIVE  NEGATIVE Final   CULTURE, BLOOD (ROUTINE X 2)     Status: Normal (Preliminary result)   Collection Time   04/17/12  8:05 AM      Component Value Range Status Comment   Specimen Description BLOOD RIGHT HAND   Final    Special Requests BOTTLES DRAWN AEROBIC AND ANAEROBIC 10CC   Final    Culture  Setup Time 04/17/2012 13:22   Final    Culture     Final    Value:        BLOOD CULTURE RECEIVED NO GROWTH TO DATE CULTURE WILL BE HELD FOR 5 DAYS BEFORE ISSUING A FINAL NEGATIVE REPORT   Report Status PENDING   Incomplete   CULTURE, BLOOD (ROUTINE X 2)     Status: Normal (Preliminary result)   Collection Time   04/17/12  8:14 AM      Component Value Range Status Comment   Specimen Description BLOOD LEFT ARM   Final    Special Requests BOTTLES DRAWN AEROBIC ONLY 10CC   Final    Culture  Setup Time 04/17/2012 13:22   Final    Culture     Final    Value:        BLOOD CULTURE RECEIVED NO GROWTH TO DATE CULTURE WILL BE HELD FOR 5 DAYS BEFORE ISSUING A  FINAL NEGATIVE REPORT   Report Status PENDING   Incomplete   CULTURE, RESPIRATORY     Status: Normal (Preliminary result)   Collection Time   04/17/12 12:41 PM      Component Value Range Status Comment   Specimen Description TRACHEAL ASPIRATE   Final    Special Requests NONE   Final    Gram Stain     Final    Value: FEW WBC PRESENT, PREDOMINANTLY PMN     RARE SQUAMOUS EPITHELIAL CELLS PRESENT     MODERATE GRAM POSITIVE COCCI IN PAIRS   Culture Non-Pathogenic Oropharyngeal-type Flora Isolated.   Final    Report Status PENDING   Incomplete   CULTURE, BLOOD (ROUTINE X 2)     Status: Normal (Preliminary result)   Collection Time   04/18/12 12:41 PM      Component Value Range Status Comment   Specimen Description BLOOD LEFT ARM   Final    Special Requests BOTTLES DRAWN AEROBIC AND ANAEROBIC 10CC   Final    Culture  Setup Time 04/18/2012 20:58   Final    Culture  Final    Value:        BLOOD CULTURE RECEIVED NO GROWTH TO DATE CULTURE WILL BE HELD FOR 5 DAYS BEFORE ISSUING A FINAL NEGATIVE REPORT   Report Status PENDING   Incomplete   CULTURE, BLOOD (ROUTINE X 2)     Status: Normal (Preliminary result)   Collection Time   04/18/12 12:49 PM      Component Value Range Status Comment   Specimen Description BLOOD LEFT ARM   Final    Special Requests BOTTLES DRAWN AEROBIC ONLY 10CC   Final    Culture  Setup Time 04/18/2012 20:57   Final    Culture     Final    Value:        BLOOD CULTURE RECEIVED NO GROWTH TO DATE CULTURE WILL BE HELD FOR 5 DAYS BEFORE ISSUING A FINAL NEGATIVE REPORT   Report Status PENDING   Incomplete    Medical History: Past Medical History  Diagnosis Date  . Aorto-iliac disease     bilat with stent 2007  . CAD (coronary artery disease)     with loop to the circumflex 2000, inferolateral infarction  . Hypertension   . Paroxysmal atrial flutter     ablation  . ED (erectile dysfunction)   . COPD (chronic obstructive pulmonary disease)   . Hyperlipidemia   .  Stroke   . Shortness of breath   . GERD (gastroesophageal reflux disease)   . Gout    Assessment: Patient started on IV Ceftazidime and Azithromycin for increased fever and yellow secretions.  He had Vancomycin added to his regimen yesterday - which may be the culprit for his itching.  All cultures have been negative thus far.    Patient has some renal dysfunction but creatinine has improved today to 2.0 with an estimated clearance of ~ 45ml/min, with good UOP yesterday and a negative balance.  WBC is trending down today.  He has been extubated.  CXR reveals worsening atelectasis with likely CHF still.  His Vancomycin has been stopped for potential rash, and he will be continued on Levofloxacin.  I will adjust this due to his renal function to ensure appropriate clearance.  Goal: Therapeutic response to oral antibiotics.  Plan:  1)   Change Levofloxacin to 500mg  x 1 then 250mg  daily. 2)   Watch renal function and adjust as needed.   Nadara Mustard, PharmD., MS Clinical Pharmacist Pager:  [phone] Thank you for allowing pharmacy to be part of this patients care team. 04/20/2012,8:58 AM

## 2012-04-20 NOTE — Progress Notes (Signed)
eLink Physician-Brief Progress Note Patient Name: Nathan [name] DOB: 1940-11-24 MRN: [id number]  Date of Service  04/20/2012   HPI/Events of Note  Call to elink from floor RN  Patient abruptly wennt tinto A Fib. Current bp 130 sbp and HR 158 per RN  eICU Interventions  Lopressor prn porder sent   Intervention Category Major Interventions: Arrhythmia - evaluation and management  [name] 04/20/2012, 11:55 PM

## 2012-04-20 NOTE — Progress Notes (Signed)
Patient Name: Nathan [name] Date of Encounter: 04/20/2012    SUBJECTIVE: The patient is extubated in looks much better than yesterday. He denies dyspnea. There is no chest pain. He has no recollection of what went on at home prior to admission. He recalls stop taking his medications but he's not sure why he did that.  TELEMETRY:  Normal sinus rhythm: Filed Vitals:   04/20/12 0400 04/20/12 0500 04/20/12 0600 04/20/12 0700  BP: 120/55 127/56 120/56 127/54  Pulse: 93 94 87 95  Temp: 99.3 F (37.4 C) 99.1 F (37.3 C) 99.1 F (37.3 C) 99.1 F (37.3 C)  TempSrc:      Resp: 20 17 19 21   Height:      Weight:  79.1 kg (174 lb 6.1 oz)    SpO2: 96% 97% 94% 96%    Intake/Output Summary (Last 24 hours) at 04/20/12 0828 Last data filed at 04/20/12 0600  Gross per 24 hour  Intake   1746 ml  Output   2513 ml  Net   -767 ml   Net I/O since admission:  -4529 cc  LABS: Basic Metabolic Panel:  Basename 04/20/12 0525 04/19/12 0530 04/18/12 0520  NA 141 137 --  K 3.7 3.4* --  CL 97 99 --  CO2 31 28 --  GLUCOSE 104* 121* --  BUN 26* 33* --  CREATININE 2.05* 2.37* --  CALCIUM 9.6 8.8 --  MG -- -- 2.0  PHOS -- -- 5.2*   CBC:  Basename 04/20/12 0525 04/19/12 0530  WBC 9.0 12.6*  NEUTROABS -- --  HGB 9.6* 8.6*  HCT 31.0* 27.6*  MCV 90.4 90.5  PLT 284 256   Cardiac Enzymes:  Basename 04/17/12 1902 04/17/12 1127  CKTOTAL -- --  CKMB -- --  CKMBINDEX -- --  TROPONINI 0.82* 0.72*    Radiology/Studies:  Chest x-ray this a.m. still looks like pulmonary edema  Physical Exam: Blood pressure 127/54, pulse 95, temperature 99.1 F (37.3 C), temperature source Core (Comment), resp. rate 21, height 6' 1.62" (1.87 m), weight 79.1 kg (174 lb 6.1 oz), SpO2 96.00%. Weight change: 0 kg (0 lb)   S4 gallop with 2/ 6 systolic murmur right upper sternal border  Faint rales at the bases. Rhonchi are heard.  No peripheral edema  ASSESSMENT:  1. Acute respiratory failure, improved  with diuresis and empiric antibiotic therapy for heart failure and infection respectively  2. Acute diastolic heart failure, improved with diuresis  3. Acute on chronic kidney disease with stable creatinine despite a near 5 L diuresis.  4. Medication noncompliance we'll need to be addressed prior to discharge  5. Chronic hypoxia possibly have a role in the patient's presentation  6. Non-ST elevation myocardial infarction due to demand ischemia.  Plan:  1. Continue diuresis, and convert diuretics to oral management  2. Add low-dose beta blocker therapy (if okay with pulmonary)  3. Case management  4. Will discuss with wife. Apparently patient is difficult to manage at home, continues to smoke, and will not take his medications  Signed, Lesleigh Noe 04/20/2012, 8:28 AM

## 2012-04-20 NOTE — Progress Notes (Signed)
PULMONARY  / CRITICAL CARE MEDICINE  Name: Nathan [name] MRN: [id number] DOB: 31-Jul-1940    LOS: 3  REFERRING MD :  [facility] Emergency dept   CHIEF COMPLAINT:  Pulmonary Edema, VDRF  BRIEF PATIENT DESCRIPTION: 71 yo male smoker admitted on 04/17/2012 with acute respiratory failure with VDRF, NSTEMI, acute pulmonary edema.  Had admit in November 2013 for NSTEMI>>family reports pt was not complying with medications or supplemental oxygen at home.  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  Denies chest pain.  Itching improved.  VITAL SIGNS: Temp:  [98.8 F (37.1 C)-101.3 F (38.5 C)] 99.1 F (37.3 C) (12/10 0700) Pulse Rate:  [87-120] 95  (12/10 0700) Resp:  [17-32] 21  (12/10 0700) BP: (109-151)/(48-128) 127/54 mmHg (12/10 0700) SpO2:  [88 %-98 %] 97 % (12/10 0838) Weight:  [174 lb 6.1 oz (79.1 kg)] 174 lb 6.1 oz (79.1 kg) (12/10 0500) HEMODYNAMICS:   VENTILATOR SETTINGS:   INTAKE / OUTPUT: Intake/Output      12/09 0701 - 12/10 0700 12/10 0701 - 12/11 0700   P.O. 440    I.V. (mL/kg) 672 (8.5)    NG/GT 140    IV Piggyback 554    Total Intake(mL/kg) 1806 (22.8)    Urine (mL/kg/hr) 2663 (1.4)    Total Output 2663    Net -857           PHYSICAL EXAMINATION: General:  No ditress Neuro: Follows commands HEENT:  No sinus tenderness Cardiovascular:  s1s2 regular, 2/6  SM Lungs: prolonged exhalation, no wheeze, basilar rales Abdomen:  Soft, non tender Musculoskeletal:  No edema Skin:  No rash   LABS: Cbc  Lab 04/20/12 0525 04/19/12 0530 04/18/12 0520  WBC 9.0 -- --  HGB 9.6* 8.6* 8.9*  HCT 31.0* 27.6* 28.6*  PLT 284 256 276   Chemistry   Lab 04/20/12 0525 04/19/12 0530 04/18/12 1645 04/18/12 0520  NA 141 137 139 --  K 3.7 3.4* 4.1 --  CL 97 99 103 --  CO2 31 28 25  --  BUN 26* 33* 34* --  CREATININE 2.05* 2.37* 2.52* --  CALCIUM 9.6 8.8 9.1 --  MG -- -- -- 2.0  PHOS -- -- -- 5.2*  GLUCOSE 104* 121* 82 --    Liver fxn No results found for this basename: AST:3,ALT:3,ALKPHOS:3,BILITOT:3,PROT:3,ALBUMIN:3 in the last 168 hours coags No results found for this basename: APTT:3,INR:3 in the last 168 hours Sepsis markers  Lab 04/19/12 0530 04/18/12 1235 04/17/12 0809 04/17/12 0803  LATICACIDVEN -- -- -- 1.3  PROCALCITON 1.39 2.14 0.22 --   Cardiac markers  Lab 04/17/12 1902 04/17/12 1127 04/17/12 0545  CKTOTAL -- -- --  CKMB -- -- --  TROPONINI 0.82* 0.72* 0.50*   BNP  Lab 04/18/12 0520 04/17/12 0545  PROBNP 5239.0* 8088.0*   ABG  Lab 04/18/12 0448 04/17/12 0737 04/17/12 0612  PHART 7.323* 7.291* 7.151*  PCO2ART 48.2* 48.2* 66.8*  PO2ART 74.4* 75.0* 275.0*  HCO3 24.3* 23.2 23.3  TCO2 25.7 25 25     CBG trend  Lab 04/19/12 1211 04/19/12 0514 04/18/12 2355 04/18/12 1600 04/18/12 1227  GLUCAP 140* 132* 115* 93 101*    IMAGING: Dg Chest Port 1 View  04/20/2012  *RADIOLOGY REPORT*  Clinical Data: Follow-up evaluation of pneumonia.  Congestive heart failure.  Shortness of breath.  PORTABLE CHEST - 1 VIEW  Comparison: Chest x-ray 04/18/2012.  Findings: The patient has been extubated and previously noted nasogastric tube has been removed.  Lung volumes have slightly decreased.  There are increasing bibasilar opacities (left greater than right), compatible with worsening areas of atelectasis and/or consolidation, in addition to  the increasing small bilateral pleural effusions (left greater than right). There is cephalization of the pulmonary vasculature, indistinctness of the interstitial markings, and patchy airspace disease throughout the lungs bilaterally suggestive of moderate pulmonary edema.  Mild cardiomegaly.  Mediastinal contours are unremarkable. Atherosclerosis in the thoracic aorta.  Status post median sternotomy for CABG.  IMPRESSION: 1.  Support apparatus, as above. 2.  Decreasing lung volumes with worsening bibasilar atelectasis and/or consolidation and slight increase in small bilateral pleural effusions (left greater than right). 3.  Findings remain consistent with underlying congestive heart failure.   Original Report Authenticated By: Trudie Reed, M.D.     DIAGNOSES: Active Problems:  Hypertension  CAD (coronary artery disease)  Paroxysmal atrial flutter  COPD (chronic obstructive pulmonary disease)  Acute renal failure  Acute on chronic diastolic CHF (congestive heart failure)  NSTEMI (non-ST elevated myocardial infarction)  Pulmonary edema  Respiratory failure, acute-on-chronic   ASSESSMENT / PLAN:  PULMONARY  ASSESSMENT:  Acute respiratory failure 2nd to PNA, acute pulmonary edema. Hx of smoking with reported hx of COPD. PLAN:   D/c BiPAP Adjust oxygen to keep SpO2 > 90% Continue scheduled BD's Will need further assessment for COPD as outpt Needs to stop smoking F/u CXR intermittently Add flutter valve  CARDIOVASCULAR  ASSESSMENT:  NSTEMI with acute pulmonary edema likely 2nd to demand ischemia from PNA. Hx of CAD, HTN, A fib, PAD, Hyperlipidemia, Mitral regurgitation.  PLAN:  D/c heparin gtt 12/10 and change to Lovenox for DVT prophylaxis Transition to po lasix Continue ASA, simvastatin Okay to resume lopressor >> defer timing to cardiology  RENAL  ASSESSMENT:   Acute on chronic renal failure. Creatinine trending down. Hyperkalemia. Resolved. PLAN:   Monitor  renal fx, urine outpt, electrolytes  GASTROINTESTINAL  ASSESSMENT:   Hx of GERD. Nutrition. PLAN:   Continue protonix Continue diet  HEMATOLOGIC  ASSESSMENT:   Anemia or critical illness.  PLAN:  F/u CBC  INFECTIOUS  ASSESSMENT:   PNA with recent hospital admit. Persistent fever 12/09>>improved 12/10.  PLAN:   Narrow Abx to levaquin   ENDOCRINE  ASSESSMENT:   Hx of Gout.  PLAN:   Continue allopurinol, colchicine  NEUROLOGIC  ASSESSMENT:   ?mild dementia. PLAN:   Monitor mental status  DERMATOLOGY  ASSESSMENT: Pruritis with faint macular rash on back>>?reaction to fortaz or vancomycin. Improved 12/10.  PLAN: PRN benadryl  Transfer to telemetry.  OOB to chair.  PT/OT evaluation.  D/C foley.  Will keep on PCCM service.  Discussed plan with Dr. Katrinka Blazing.  Coralyn Helling, MD [facility] Pulmonary/Critical Care 04/20/2012, 8:58 AM Pager:  [phone] After 3pm call: [phone]

## 2012-04-20 NOTE — Progress Notes (Signed)
Pt arrived to 4737 via wheelchair from 2900. Pt oriented to rm and 4700.  Will con't to monitor.

## 2012-04-21 ENCOUNTER — Ambulatory Visit (HOSPITAL_COMMUNITY): Payer: Medicare PPO

## 2012-04-21 ENCOUNTER — Encounter (HOSPITAL_COMMUNITY): Admission: RE | Admit: 2012-04-21 | Payer: Medicare PPO | Source: Ambulatory Visit

## 2012-04-21 LAB — BASIC METABOLIC PANEL
CO2: 32 mEq/L (ref 19–32)
Chloride: 96 mEq/L (ref 96–112)
Creatinine, Ser: 2.05 mg/dL — ABNORMAL HIGH (ref 0.50–1.35)
GFR calc Af Amer: 35 mL/min — ABNORMAL LOW (ref >90)
Potassium: 3.9 mEq/L (ref 3.5–5.1)

## 2012-04-21 LAB — PRO B NATRIURETIC PEPTIDE: Pro B Natriuretic peptide (BNP): 8174 pg/mL — ABNORMAL HIGH (ref 0–125)

## 2012-04-21 MED ORDER — ONDANSETRON HCL 4 MG/2ML IJ SOLN
4.0000 mg | Freq: Three times a day (TID) | INTRAMUSCULAR | Status: DC | PRN
  Administered 2012-04-21: 4 mg via INTRAVENOUS
  Filled 2012-04-21: qty 2

## 2012-04-21 MED ORDER — DILTIAZEM HCL 60 MG PO TABS
60.0000 mg | ORAL_TABLET | Freq: Four times a day (QID) | ORAL | Status: DC
  Administered 2012-04-21 – 2012-04-22 (×3): 60 mg via ORAL
  Filled 2012-04-21 (×8): qty 1

## 2012-04-21 MED ORDER — ONDANSETRON HCL 4 MG/2ML IJ SOLN
8.0000 mg | Freq: Three times a day (TID) | INTRAMUSCULAR | Status: DC | PRN
Start: 2012-04-21 — End: 2012-04-21

## 2012-04-21 MED ORDER — FUROSEMIDE 80 MG PO TABS
80.0000 mg | ORAL_TABLET | Freq: Two times a day (BID) | ORAL | Status: DC
  Administered 2012-04-21 – 2012-04-22 (×2): 80 mg via ORAL
  Filled 2012-04-21 (×4): qty 1

## 2012-04-21 MED ORDER — ONDANSETRON 8 MG/NS 50 ML IVPB
8.0000 mg | Freq: Three times a day (TID) | INTRAVENOUS | Status: DC | PRN
  Filled 2012-04-21: qty 8

## 2012-04-21 MED ORDER — METOPROLOL TARTRATE 12.5 MG HALF TABLET
12.5000 mg | ORAL_TABLET | Freq: Two times a day (BID) | ORAL | Status: DC
  Administered 2012-04-21 – 2012-04-22 (×3): 12.5 mg via ORAL
  Filled 2012-04-21 (×5): qty 1

## 2012-04-21 MED ORDER — DEXTROSE 5 % IV SOLN
5.0000 mg/h | INTRAVENOUS | Status: DC
  Administered 2012-04-21: 10 mg/h via INTRAVENOUS
  Administered 2012-04-21: 5 mg/h via INTRAVENOUS
  Filled 2012-04-21 (×2): qty 100

## 2012-04-21 MED ORDER — TIOTROPIUM BROMIDE MONOHYDRATE 18 MCG IN CAPS
18.0000 ug | ORAL_CAPSULE | Freq: Every day | RESPIRATORY_TRACT | Status: DC
  Administered 2012-04-22: 18 ug via RESPIRATORY_TRACT
  Filled 2012-04-21: qty 5

## 2012-04-21 NOTE — Progress Notes (Signed)
Physical Therapy Treatment Patient Details Name: Nathan [name] MRN: [id number] DOB: [birth year] Today's Date: 04/21/2012 Time: 1191-4782 PT Time Calculation (min): 24 min  PT Assessment / Plan / Recommendation Comments on Treatment Session  Nathan [name] was saturated with urine in the bed when I arrived with decreased awareness. With set up pt assist, pt able to perform his own pericare in standing with stand by assist and adequate standing balance. During gait pt definitely demonstrating decreased capability to navigate more dynamic challenges. Will attempt DGI next session.    Follow Up Recommendations  Home health PT;Supervision for mobility/OOB     Does the patient have the potential to tolerate intense rehabilitation     Barriers to Discharge        Equipment Recommendations  None recommended by PT    Recommendations for Other Services    Frequency Min 3X/week   Plan Discharge plan remains appropriate;Frequency remains appropriate    Precautions / Restrictions Precautions Precautions: Fall Restrictions Weight Bearing Restrictions: No       Mobility  Bed Mobility Supine to Sit: 7: Independent Sit to Supine: 7: Independent Transfers Transfers: Sit to Stand;Stand to Sit Sit to Stand: 5: Supervision;With upper extremity assist;From bed Stand to Sit: 5: Supervision;With upper extremity assist;To chair/3-in-1 Details for Transfer Assistance: no instabiliy today but pt did reach for RUE support in standing Ambulation/Gait Ambulation/Gait Assistance: 4: Min guard Ambulation Distance (Feet): 300 Feet Assistive device: None Ambulation/Gait Assistance Details: gaurding for instability, no LOB but increased lateral sway Gait Pattern: Narrow base of support;Decreased stride length;Decreased trunk rotation     PT Goals Acute Rehab PT Goals PT Goal: Sit to Stand - Progress: Progressing toward goal PT Goal: Stand to Sit - Progress: Progressing toward goal PT Goal: Ambulate  - Progress: Progressing toward goal  Visit Information  Last PT Received On: 04/21/12 Assistance Needed: +1    Subjective Data  Subjective: I'd like to go home.    Cognition  Overall Cognitive Status: Impaired Area of Impairment: Memory;Safety/judgement;Awareness of deficits Arousal/Alertness: Awake/alert Orientation Level: Appears intact for tasks assessed Behavior During Session: [facility] for tasks performed Memory Deficits: unable to remember that he was recently in the hospital, thought his last admission was in June (he was just here before Thanksgiving) Safety/Judgement: Decreased safety judgement for tasks assessed;Decreased awareness of need for assistance Cognition - Other Comments: pt unsteady with gait with decreased awareness of how this would affect his safety    Balance     End of Session PT - End of Session Equipment Utilized During Treatment: Gait belt Activity Tolerance: Patient tolerated treatment well Patient left: in chair;with call bell/phone within reach Nurse Communication: Mobility status   GP     [facility] Treatment Nathan [name] 04/21/2012, 11:32 AM

## 2012-04-21 NOTE — Plan of Care (Signed)
Problem: Phase I Progression Outcomes Goal: EF % per last Echo/documented,Core Reminder form on chart Outcome: Completed/Met Date Met:  04/21/12 Echo 04/03/2012 EF 50 to 55 %

## 2012-04-21 NOTE — Progress Notes (Signed)
Pts. Heart rate down to 120s-130s with 5mg  IV metoprolol.  After 45 minutes, heart rate elevated to 150s sustaining.  BP 122/64.  Pt. Still denies chest pain and is laying in bed asymptomatic.  Dr. Marchelle Gearing made aware.  See new orders.  Will continue to monitor patient.

## 2012-04-21 NOTE — Progress Notes (Signed)
eLink Physician-Brief Progress Note Patient Name: Nathan [name] DOB: 1940-09-14 MRN: [id number]  Date of Service  04/21/2012   HPI/Events of Note   RN calling back. Saying lopressor worked only transiently. HR back in 150s. PAtient asymptomatic  eICU Interventions  Start cardizem gtt Continue dvt proph dose of lovenox; hold off starting IV heparin gtt for now   Intervention Category Major Interventions: Arrhythmia - evaluation and management  Darrly Loberg 04/21/2012, 12:50 AM

## 2012-04-21 NOTE — Progress Notes (Signed)
Patient Name: Nathan [name] Date of Encounter: 04/21/2012    SUBJECTIVE: The patient denies dyspnea and chest pain. He did have an episode of atrial fibrillation with rapid ventricular response last evening. He did not develop chest pain other complaints. He had spontaneous conversion to  TELEMETRY:  Normal sinus rhythm.: Filed Vitals:   04/21/12 0152 04/21/12 0313 04/21/12 0500 04/21/12 0828  BP: 114/62 112/60 105/75   Pulse: 138 145 98   Temp:   98 F (36.7 C)   TempSrc:   Oral   Resp:   20   Height:      Weight:   76.93 kg (169 lb 9.6 oz)   SpO2:   99% 99%    Intake/Output Summary (Last 24 hours) at 04/21/12 0840 Last data filed at 04/21/12 0500  Gross per 24 hour  Intake 1035.59 ml  Output    750 ml  Net 285.59 ml   NET I/O: - 2627 cc LABS: Basic Metabolic Panel:  Basename 04/21/12 0520 04/20/12 0525  NA 139 141  K 3.9 3.7  CL 96 97  CO2 32 31  GLUCOSE 102* 104*  BUN 24* 26*  CREATININE 2.05* 2.05*  CALCIUM 9.8 9.6  MG -- --  PHOS -- --   CBC:  Basename 04/20/12 0525 04/19/12 0530  WBC 9.0 12.6*  NEUTROABS -- --  HGB 9.6* 8.6*  HCT 31.0* 27.6*  MCV 90.4 90.5  PLT 284 256   Radiology/Studies:   RADIOLOGY REPORT*  Clinical Data: Follow-up evaluation of pneumonia. Congestive heart  failure. Shortness of breath.  PORTABLE CHEST - 1 VIEW  Comparison: Chest x-ray 04/18/2012.  Findings: The patient has been extubated and previously noted  nasogastric tube has been removed. Lung volumes have slightly  decreased. There are increasing bibasilar opacities (left greater  than right), compatible with worsening areas of atelectasis and/or  consolidation, in addition to the increasing small bilateral  pleural effusions (left greater than right). There is cephalization  of the pulmonary vasculature, indistinctness of the interstitial  markings, and patchy airspace disease throughout the lungs  bilaterally suggestive of moderate pulmonary edema. Mild   cardiomegaly. Mediastinal contours are unremarkable.  Atherosclerosis in the thoracic aorta. Status post median  sternotomy for CABG.  IMPRESSION:  1. Support apparatus, as above.  2. Decreasing lung volumes with worsening bibasilar atelectasis  and/or consolidation and slight increase in small bilateral pleural  effusions (left greater than right).  3. Findings remain consistent with underlying congestive heart  failure.  Original Report Authenticated By: Trudie Reed, M.D.   Physical Exam: Blood pressure 105/75, pulse 98, temperature 98 F (36.7 C), temperature source Oral, resp. rate 20, height 6\' 1"  (1.854 m), weight 76.93 kg (169 lb 9.6 oz), SpO2 99.00%. Weight change: -1.9 kg (-4 lb 3 oz)   The patient is in no distress. Skin color is normal. Respiratory rate is normal and he is nonlabored  Decreased breath sounds at the right base with faint mid lung rales.  1-2 of 6 systolic murmur along the left mid sternal border. S4 gallop is audible. Rhythm is regular.  Extremities reveal no edema.  Neurologically intact. Able have a coherent conversation.  ASSESSMENT:  1. Acute on chronic diastolic heart failure, with still some evidence of heart failure in the noted by pleural effusions.  2. Infiltrates noted on chest x-ray possibly related atelectasis versus infiltrates versus edema fluid. Continue antibiotics for possible superimposed infection  3. Chronic kidney disease with improvement in renal function despite diuresis.  4. Paroxysmal atrial fibrillation last p.m. without decompensation.  5. COPD, with hypoxemia  6. Non-ST elevation MI, type II  Plan:  1. Continue diuresis, and find the correct oral dose to prevent reaccumulation of volume.  2. Titrate up beta blocker therapy to hopefully get some protection against recurrent atrial fibrillation  3. Will discuss with wife and patient the role of compliance in keeping him out of the hospital and helping him to  improve chances of survival.  4. Recommendations per critical care medicine/pulmonary with reference to hypoxia management at home   Signed, Lesleigh Noe 04/21/2012, 8:40 AM

## 2012-04-21 NOTE — Progress Notes (Signed)
Pts. Heart rate elevated to 140s-150s sustaining.  EKG showed a-fib with RVR and some ST abnormalities.  Pt. Denies chest pain and is asymptomatic laying in bed.  BP 135/67.  Dr. Marchelle Gearing made aware.  See new orders.  Will continue to monitor.

## 2012-04-21 NOTE — Progress Notes (Signed)
PULMONARY  / CRITICAL CARE MEDICINE  Name: Nathan [name] MRN: [id number] DOB: 10-Oct-1940    LOS: 4  REFERRING MD :  [facility] Emergency dept   CHIEF COMPLAINT:  Pulmonary Edema, VDRF  BRIEF PATIENT DESCRIPTION: 71 yo male smoker admitted on 04/17/2012 with acute respiratory failure with VDRF, NSTEMI, acute pulmonary edema.  Had admit in November 2013 for NSTEMI>>family reports pt was not complying with medications or supplemental oxygen at home.  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  Now on RA on tele unit  VITAL SIGNS: Temp:  [98 F (36.7 C)-98.5 F (36.9 C)] 98.2 F (36.8 C) (12/11 1343) Pulse Rate:  [75-145] 75  (12/11 1343) Resp:  [18-20] 19  (12/11 1343) BP: (102-135)/(59-77) 119/70 mmHg (12/11 1343) SpO2:  [90 %-100 %] 98 % (12/11 1343) Weight:  [76.93 kg (169 lb 9.6 oz)] 76.93 kg (169 lb 9.6 oz) (12/11 0500)    ON RA sats ok   INTAKE / OUTPUT: Intake/Output      12/10 0701 - 12/11 0700 12/11 0701 - 12/12 0700   P.O. 970 240   I.V. (mL/kg) 1665.6 (21.7)    NG/GT     IV Piggyback     Total Intake(mL/kg) 2635.6 (34.3) 240 (3.1)   Urine (mL/kg/hr) 750 (0.4) 700 (1.2)   Total Output 750 700   Net +1885.6 -460          PHYSICAL EXAMINATION: General:  No distress Neuro: Follows commands HEENT:  No sinus tenderness Cardiovascular:  s1s2 regular, 2/6  SM Lungs: prolonged exhalation, no wheeze, no rales Abdomen:  Soft, non tender Musculoskeletal:  No edema Skin:  No rash   LABS: Cbc  Lab 04/20/12 0525 04/19/12 0530 04/18/12 0520  WBC 9.0 -- --  HGB 9.6* 8.6* 8.9*  HCT 31.0* 27.6* 28.6*  PLT 284 256 276   Chemistry   Lab 04/21/12 0520 04/20/12 0525 04/19/12 0530 04/18/12 0520  NA 139 141 137 --  K 3.9 3.7 3.4* --  CL 96 97 99 --  CO2 32 31 28 --  BUN 24* 26* 33* --  CREATININE 2.05* 2.05* 2.37* --  CALCIUM 9.8 9.6 8.8 --  MG -- -- -- 2.0  PHOS -- -- -- 5.2*  GLUCOSE 102* 104* 121* --    Liver fxn No results found for this basename: AST:3,ALT:3,ALKPHOS:3,BILITOT:3,PROT:3,ALBUMIN:3 in the last 168 hours coags No results found for this basename: APTT:3,INR:3 in the last 168 hours Sepsis markers  Lab 04/19/12 0530 04/18/12 1235 04/17/12 0809 04/17/12 0803  LATICACIDVEN -- -- -- 1.3  PROCALCITON 1.39 2.14 0.22 --   Cardiac markers  Lab 04/17/12 1902 04/17/12 1127 04/17/12 0545  CKTOTAL -- -- --  CKMB -- -- --  TROPONINI 0.82* 0.72* 0.50*   BNP  Lab 04/21/12 0520 04/18/12 0520 04/17/12 0545  PROBNP 8174.0* 5239.0* 8088.0*   ABG  Lab 04/18/12 0448 04/17/12  0737 04/17/12 0612  PHART 7.323* 7.291* 7.151*  PCO2ART 48.2* 48.2* 66.8*  PO2ART 74.4* 75.0* 275.0*  HCO3 24.3* 23.2 23.3  TCO2 25.7 25 25     CBG trend  Lab 04/19/12 1211 04/19/12 0514 04/18/12 2355 04/18/12 1600 04/18/12 1227  GLUCAP 140* 132* 115* 93 101*    IMAGING: 12/11: mild edema on cxr  DIAGNOSES: Active Problems:  Hypertension  CAD (coronary artery disease)  Paroxysmal atrial flutter  COPD (chronic obstructive pulmonary disease)  Acute renal failure  Acute on chronic diastolic CHF (congestive heart failure)  NSTEMI (non-ST elevated myocardial infarction)  Pulmonary edema  Respiratory failure, acute-on-chronic   ASSESSMENT / PLAN:  PULMONARY  ASSESSMENT:  Acute respiratory failure 2nd to PNA, acute pulmonary  edema.RESOLVED Hx of smoking with reported hx of COPD. PLAN:   Cont IS and flutter valve Spiriva daily Titrate off oxygen  CARDIOVASCULAR  ASSESSMENT:  NSTEMI with acute pulmonary edema likely 2nd to demand ischemia from PNA. Hx of CAD, HTN, A fib, PAD, Hyperlipidemia, Mitral regurgitation.  PLAN:   Lovenox for DVT prophylaxis Cont  po lasix Continue ASA, simvastatin Okay to resume lopressor >> defer timing to cardiology Change dilt drip to po cardiazem  RENAL  ASSESSMENT:   Acute on chronic renal failure. Creatinine trending down. Hyperkalemia. Resolved. PLAN:   Monitor renal fx, urine outpt, electrolytes  GASTROINTESTINAL  ASSESSMENT:   Hx of GERD. Nutrition. PLAN:   Continue protonix Continue diet  HEMATOLOGIC  ASSESSMENT:   Anemia or critical illness.  PLAN:  F/u CBC  INFECTIOUS  ASSESSMENT:   PNA with recent hospital admit. Persistent fever 12/09>>improved 12/10.  PLAN:   Cont levaquin  ENDOCRINE  ASSESSMENT:   Hx of Gout.  PLAN:   Continue allopurinol, colchicine  NEUROLOGIC  ASSESSMENT:   ?mild dementia. PLAN:   Monitor mental status  DERMATOLOGY  ASSESSMENT: Pruritis with faint macular rash on back>>?reaction to fortaz or vancomycin. Improved 12/10.  PLAN: PRN benadryl   Shan Levans Beeper  [phone]  Cell  [phone]  If no response or cell goes to voicemail, call beeper [phone]] [facility] 04/21/2012, 2:17 PM

## 2012-04-21 NOTE — Progress Notes (Signed)
Heart rate fluctuating between 130s-150s.  Clarified with Dr. Marchelle Gearing if he wanted metoprolol prn given vs. cardizem bolus.  5mg  IV metoprolol given per ordered.  Will continue to monitor.

## 2012-04-21 NOTE — Progress Notes (Signed)
Pt. Converted to NSR.  VSS.  Patient asymptomatic.  Dr. Marchelle Gearing made aware.  Will continue to monitor patient.

## 2012-04-21 NOTE — Progress Notes (Signed)
Occupational Therapy Evaluation Patient Details Name: Nathan [name] MRN: [id number] DOB: Jun 13, 1940 Today's Date: 04/21/2012 Time: 4098-1191 OT Time Calculation (min): 21 min  OT Assessment / Plan / Recommendation Clinical Impression  71 yo with admission for ARF with VDRF, NSTEMI, acute pulmonary edema. Admit in Nov 2013 for NSTEMI. Pt unable to remember recent admit in November. Pt at overall S level for ADL and mobility for ADL. Rec intermittent S at D/C. No further OT indicated at this time.    OT Assessment  Patient does not need any further OT services    Follow Up Recommendations  No OT follow up    Barriers to Discharge  none    Equipment Recommendations  None recommended by OT    Recommendations for Other Services  [facility] nursing for medication management  Frequency    eval only   Precautions / Restrictions     Pertinent Vitals/Pain No c/o pain    ADL  Eating/Feeding: Independent Where Assessed - Eating/Feeding: Chair Grooming: Modified independent Where Assessed - Grooming: Unsupported standing Upper Body Bathing: Modified independent Where Assessed - Upper Body Bathing: Unsupported sitting Lower Body Bathing: Supervision/safety;Set up Where Assessed - Lower Body Bathing: Unsupported sit to stand Upper Body Dressing: Modified independent Where Assessed - Upper Body Dressing: Unsupported sitting Lower Body Dressing: Supervision/safety;Set up Where Assessed - Lower Body Dressing: Unsupported sit to stand Toilet Transfer: Supervision/safety Toilet Transfer Method: Sit to Barista: Comfort height toilet Toileting - Clothing Manipulation and Hygiene: Supervision/safety Where Assessed - Engineer, mining and Hygiene: Standing Tub/Shower Transfer: Therapist, sports Method: Ambulating Equipment Used: Gait belt Transfers/Ambulation Related to ADLs: overall mod I to S ADL Comments: Discussed E conservation  techniques and home safety    OT Diagnosis:    OT Problem List:   OT Treatment Interventions:     OT Goals    Visit Information  Last OT Received On: 04/21/12 Assistance Needed: +1    Subjective Data      Prior Functioning     Home Living Lives With: Spouse Available Help at Discharge: Available PRN/intermittently;Other (Comment) Type of Home: Mobile home Home Access: Stairs to enter Entrance Stairs-Number of Steps: 5 Entrance Stairs-Rails: Can reach both Home Layout: One level Bathroom Shower/Tub: Tub/shower unit;Curtain Bathroom Toilet: Standard Bathroom Accessibility: Yes Home Adaptive Equipment: Walker - rolling;Bedside commode/3-in-1;Shower chair without back;Other (comment) Additional Comments: wife works from 6 - 8 am and then available at other times during the day Prior Function Level of Independence: Independent Able to Take Stairs?: Yes Driving: Yes Vocation: Retired Musician: No difficulties Dominant Hand: Right         Vision/Perception  glasses   Cognition  Overall Cognitive Status: Impaired Area of Impairment: Memory;Awareness of errors Arousal/Alertness: Awake/alert Orientation Level: Appears intact for tasks assessed Behavior During Session: [facility] for tasks performed Memory: Decreased recall of precautions Memory Deficits: apparent STM deficits Awareness of Errors: Assistance required to identify errors made (did not realize sway while walking down hall) Cognition - Other Comments: Appears to compensate at times for possible cognitive deficits    Extremity/Trunk Assessment Right Upper Extremity Assessment RUE ROM/Strength/Tone: WFL for tasks assessed RUE Sensation: WFL - Light Touch;WFL - Proprioception RUE Coordination: WFL - gross/fine motor Left Upper Extremity Assessment LUE ROM/Strength/Tone: WFL for tasks assessed LUE Sensation: WFL - Light Touch;WFL - Proprioception LUE Coordination: WFL - gross/fine  motor Right Lower Extremity Assessment RLE ROM/Strength/Tone: WFL for tasks assessed Left Lower Extremity Assessment LLE ROM/Strength/Tone: [facility] for  tasks assessed Trunk Assessment Trunk Assessment: Normal     Mobility Bed Mobility Bed Mobility: Supine to Sit Supine to Sit: 6: Modified independent (Device/Increase time) Sit to Supine: 6: Modified independent (Device/Increase time) Transfers Transfers: Sit to Stand;Stand to Sit Sit to Stand: 5: Supervision Stand to Sit: 5: Supervision     Shoulder Instructions     Exercise     Balance  S   End of Session OT - End of Session Equipment Utilized During Treatment: Gait belt Activity Tolerance: Patient tolerated treatment well Patient left: in bed;with call bell/phone within reach Nurse Communication: Mobility status  GO     Burnie Hank,HILLARY 04/21/2012, 6:19 PM [facility], OTR/L  [phone] 04/21/2012

## 2012-04-21 NOTE — Progress Notes (Signed)
Pt. Had 1.44 sec missed beat and converted to NSR for approximately 1 minute at 0437 before converting back into a-fib with RVR.  Will continue to monitor patient.

## 2012-04-22 ENCOUNTER — Inpatient Hospital Stay (HOSPITAL_COMMUNITY): Payer: Medicare PPO

## 2012-04-22 DIAGNOSIS — E872 Acidosis: Secondary | ICD-10-CM

## 2012-04-22 DIAGNOSIS — N189 Chronic kidney disease, unspecified: Secondary | ICD-10-CM

## 2012-04-22 DIAGNOSIS — I340 Nonrheumatic mitral (valve) insufficiency: Secondary | ICD-10-CM | POA: Diagnosis present

## 2012-04-22 LAB — BASIC METABOLIC PANEL
Calcium: 9.9 mg/dL (ref 8.4–10.5)
Creatinine, Ser: 2.16 mg/dL — ABNORMAL HIGH (ref 0.50–1.35)
GFR calc non Af Amer: 29 mL/min — ABNORMAL LOW (ref >90)
Sodium: 141 mEq/L (ref 135–145)

## 2012-04-22 MED ORDER — FUROSEMIDE 80 MG PO TABS: 80.0000 mg | ORAL_TABLET | Freq: Two times a day (BID) | ORAL | Status: DC

## 2012-04-22 MED ORDER — ASPIRIN 81 MG PO CHEW: 81.0000 mg | CHEWABLE_TABLET | Freq: Every day | ORAL | Status: DC

## 2012-04-22 MED ORDER — DILTIAZEM HCL ER COATED BEADS 240 MG PO CP24: 240.0000 mg | ORAL_CAPSULE | Freq: Every day | ORAL | Status: DC

## 2012-04-22 MED ORDER — SPIRONOLACTONE 12.5 MG HALF TABLET: 12.5000 mg | ORAL_TABLET | Freq: Two times a day (BID) | ORAL | Status: DC

## 2012-04-22 MED ORDER — METOPROLOL TARTRATE 12.5 MG HALF TABLET: 12.5000 mg | ORAL_TABLET | Freq: Two times a day (BID) | ORAL | Status: DC

## 2012-04-22 MED ORDER — TIOTROPIUM BROMIDE MONOHYDRATE 18 MCG IN CAPS: 18.0000 ug | ORAL_CAPSULE | Freq: Every day | RESPIRATORY_TRACT | Status: DC

## 2012-04-22 MED ORDER — DILTIAZEM HCL ER COATED BEADS 240 MG PO CP24
240.0000 mg | ORAL_CAPSULE | Freq: Every day | ORAL | Status: DC
  Administered 2012-04-22: 240 mg via ORAL
  Filled 2012-04-22 (×2): qty 1

## 2012-04-22 NOTE — Consult Note (Signed)
Advanced Heart Failure Team History and Physical Note   Primary Physician:  Referring & Primary Cardiologist: Dr. Garnette Scheuermann   HPI:    Nathan [name] is a 71 y.o. gentlemen with a cardiac history that includes CAD s/p CABG x4 (LIMA-LAD, SVG-Diag, SVG-OM1 and SVG-PDA) in June 2013, AF s/p ablation, diastolic heart failure, hypertension and recent NSTEMI.  He continues to smoke 1ppd.  He also has history of COPD but noncompliant with home O2, chronic renal failure (baseline Cr 1.5-1.7), stroke and GERD.    He was admitted in November 2013 for NSTEMI, acute respiratory failure and altered mental status.  He underwent emergent catheterization showed patent SVG-RCA and LIMA-LAD but total occlusion of SVG-OM as well as SVG-diag.  Medical therapy was recommended.  He was set up for home health as well as home O2.  He was discharged on 11/28 and returned with progressive dyspnea/respiratory failure.  He would not allow his wife to give him medications 3 days prior to admit on 04/17/12.  In the ER his sats did not improve on Bipap and he required intubation.  He was started on IV lasix 40 mg q8 hours on 12/8.  He was able to be extubated on 12/9.  During this time he was afebrile with leukocytosis, he He was found to have CPAP and started on abx.  Stay has been complicated by Afib with RVR, beta blocker titrated for rate.  He has diuresed 11 pounds since admit.    He feels much better since admission.  He is eager to go home.  He denies dyspnea, orthopnea or PND.  He does not weigh himself at home.  He does not follow low sodium diet or fluid restrictions.  Weight today is 166 pounds.  Cr mildly elevated at 2.16.    Review of Systems: [y] = yes, [ ]  = no   General: Weight gain [ ] ; Weight loss [ ] ; Anorexia [ ] ; Fatigue [ ] ; Fever [ ] ; Chills [ ] ; Weakness [ ]   Cardiac: Chest pain/pressure [ ] ; Resting SOB [ ] ; Exertional SOB [ ] ; Orthopnea [ ] ; Pedal Edema [ ] ; Palpitations [ ] ; Syncope [ ] ; Presyncope [ ] ;  Paroxysmal nocturnal dyspnea[ ]   Pulmonary: Cough [ ] ; Wheezing[ ] ; Hemoptysis[ ] ; Sputum [ ] ; Snoring [ ]   GI: Vomiting[ ] ; Dysphagia[ ] ; Melena[ ] ; Hematochezia [ ] ; Heartburn[ ] ; Abdominal pain [ ] ; Constipation [ ] ; Diarrhea [ ] ; BRBPR [ ]   GU: Hematuria[ ] ; Dysuria [ ] ; Nocturia[ ]   Vascular: Pain in legs with walking [ ] ; Pain in feet with lying flat [ ] ; Non-healing sores [ ] ; Stroke [ ] ; TIA [ ] ; Slurred speech [ ] ;  Neuro: Headaches[ ] ; Vertigo[ ] ; Seizures[ ] ; Paresthesias[ ] ;Blurred vision [ ] ; Diplopia [ ] ; Vision changes [ ]   Ortho/Skin: Arthritis [ ] ; Joint pain [ ] ; Muscle pain [ ] ; Joint swelling [ ] ; Back Pain [ ] ; Rash [ ]   Psych: Depression[ ] ; Anxiety[ ]   Heme: Bleeding problems [ ] ; Clotting disorders [ ] ; Anemia [ ]   Endocrine: Diabetes [ ] ; Thyroid dysfunction[ ]   Home Medications Prior to Admission medications   Medication Sig Start Date End Date Taking? Authorizing Provider  acetaminophen (TYLENOL) 325 MG tablet Take 650 mg by mouth every 6 (six) hours as needed. For pain   Yes Historical Provider, MD  allopurinol (ZYLOPRIM) 100 MG tablet Take 200 mg by mouth daily.   Yes Historical Provider, MD  amLODipine (NORVASC) 10 MG tablet Take  10 mg by mouth daily.   Yes Historical Provider, MD  aspirin 325 MG tablet Take 325 mg by mouth at bedtime.   Yes Historical Provider, MD  buPROPion (WELLBUTRIN XL) 150 MG 24 hr tablet Take 150 mg by mouth daily.   Yes Historical Provider, MD  colchicine (COLCRYS) 0.6 MG tablet Take 0.6 mg by mouth daily.   Yes Historical Provider, MD  DOXYLAMINE SUCCINATE, SLEEP, PO Take 25 mg by mouth at bedtime.   Yes Historical Provider, MD  Lactobacillus-Inulin (CULTURELLE DIGESTIVE HEALTH) CAPS Take 1 capsule by mouth daily.   Yes Historical Provider, MD  Melatonin 3 MG TABS Take 2 tablets by mouth at bedtime.   Yes Historical Provider, MD  metoprolol tartrate (LOPRESSOR) 25 MG tablet Take 25 mg by mouth 2 (two) times daily.  11/16/11  Yes Wilmon Pali, PA  nitroGLYCERIN (NITROSTAT) 0.4 MG SL tablet Place 1 tablet (0.4 mg total) under the tongue every 5 (five) minutes x 3 doses as needed for chest pain. 04/07/12  Yes Lyn Records III, MD  omeprazole (PRILOSEC) 20 MG capsule Take 1 capsule (20 mg total) by mouth daily. 1/2 before food 04/07/12  Yes Lyn Records III, MD  simvastatin (ZOCOR) 10 MG tablet Take 10 mg by mouth at bedtime.   Yes Historical Provider, MD    Past Medical History: Past Medical History  Diagnosis Date  . Aorto-iliac disease     bilat with stent 2007  . CAD (coronary artery disease)     with loop to the circumflex 2000, inferolateral infarction  . Hypertension   . Paroxysmal atrial flutter     ablation  . ED (erectile dysfunction)   . COPD (chronic obstructive pulmonary disease)   . Hyperlipidemia   . Stroke   . Shortness of breath   . GERD (gastroesophageal reflux disease)   . Gout     Past Surgical History: Past Surgical History  Procedure Date  . Spinal fusion   . Hand surgery   . Neck fusion 1975  . Eye surgery   . Coronary artery bypass graft 10/31/2011    Procedure: CORONARY ARTERY BYPASS GRAFTING (CABG);  Surgeon: Delight Ovens, MD;  Location: [facility] OR;  Service: Open Heart Surgery;  Laterality: N/A;  times three using  Left Internal Mammary Artery and Right Greater Saphenous Vein Graft harvested endoscopically; TEE  . Endarterectomy 10/31/2011    Procedure: ENDARTERECTOMY CAROTID;  Surgeon: Nada Libman, MD;  Location: [facility] OR;  Service: Vascular;  Laterality: Right;  with patch angioplasty   . Carotid endarterectomy 10-31-2011    right    Family History: Family History  Problem Relation Age of Onset  . Cancer Mother     Social History: History   Social History  . Marital Status: Married    Spouse Name: N/A    Number of Children: N/A  . Years of Education: N/A   Occupational History  . retired    Social History Main Topics  . Smoking status: Current Some Day Smoker --  1.0 packs/day for 60 years    Types: Cigarettes  . Smokeless tobacco: Never Used  . 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 Drug Use: No  . Sexually Active: None   Other Topics Concern  . None   Social History Narrative  .  None    Allergies:  No Known Allergies  Objective:    Vital Signs:   Temp:  [97.7 F (36.5 C)-98.9 F (37.2 C)] 97.7 F (36.5 C) (12/12 1027) Pulse Rate:  [75-82] 77  (12/12 1027) Resp:  [19-20] 20  (12/12 1027) BP: (119-127)/(61-70) 127/64 mmHg (12/12 1027) SpO2:  [91 %-98 %] 94 % (12/12 1027) Weight:  [166 lb 6.4 oz (75.479 kg)] 166 lb 6.4 oz (75.479 kg) (12/12 0529) Last BM Date: 04/16/12 Filed Weights   04/20/12 1211 04/21/12 0500 04/22/12 0529  Weight: 170 lb 3.1 oz (77.2 kg) 169 lb 9.6 oz (76.93 kg) 166 lb 6.4 oz (75.479 kg)    Physical Exam: General:  Well appearing. No resp difficulty HEENT: normal Neck: supple. JVP 5-6. Carotids 2+ bilat; no bruits. No lymphadenopathy or thryomegaly appreciated. Cor: PMI nondisplaced. Regular rate & rhythm. 2/6 SEM Lungs: clear Abdomen: soft, nontender, nondistended. No hepatosplenomegaly. No bruits or masses. Good bowel sounds. Extremities: no cyanosis, clubbing, rash, edema Neuro: alert & orientedx3, cranial nerves grossly intact. moves all 4 extremities w/o difficulty. Affect pleasant   Labs: Basic Metabolic Panel:  Lab 04/22/12 4098 04/21/12 0520 04/20/12 0525 04/19/12 0530 04/18/12 1645 04/18/12 0520  NA 141 139 141 137 139 --  K 3.8 3.9 3.7 3.4* 4.1 --  CL 99 96 97 99 103 --  CO2 30 32 31 28 25  --  GLUCOSE 96 102* 104* 121* 82 --  BUN 24* 24* 26* 33* 34* --  CREATININE 2.16* 2.05* 2.05* 2.37* 2.52* --  CALCIUM 9.9 9.8 9.6 -- -- --  MG -- -- -- -- -- 2.0  PHOS -- -- -- -- -- 5.2*    Liver Function Tests: No results found for this basename:  AST:5,ALT:5,ALKPHOS:5,BILITOT:5,PROT:5,ALBUMIN:5 in the last 168 hours No results found for this basename: LIPASE:5,AMYLASE:5 in the last 168 hours No results found for this basename: AMMONIA:3 in the last 168 hours  CBC:  Lab 04/20/12 0525 04/19/12 0530 04/18/12 0520 04/17/12 0545  WBC 9.0 12.6* 11.9* 17.5*  NEUTROABS -- -- -- 12.8*  HGB 9.6* 8.6* 8.9* 11.3*  HCT 31.0* 27.6* 28.6* 36.9*  MCV 90.4 90.5 93.5 93.9  PLT 284 256 276 481*    Cardiac Enzymes:  Lab 04/17/12 1902 04/17/12 1127 04/17/12 0545  CKTOTAL -- -- --  CKMB -- -- --  CKMBINDEX -- -- --  TROPONINI 0.82* 0.72* 0.50*    BNP: BNP (last 3 results)  Basename 04/21/12 0520 04/18/12 0520 04/17/12 0545  PROBNP 8174.0* 5239.0* 8088.0*    CBG:  Lab 04/19/12 1211 04/19/12 0514 04/18/12 2355 04/18/12 1600 04/18/12 1227  GLUCAP 140* 132* 115* 93 101*    Coagulation Studies: No results found for this basename: LABPROT:5,INR:5 in the last 72 hours   Imaging: Dg Chest 2 View  04/22/2012  *RADIOLOGY REPORT*  Clinical Data:  Pulmonary edema, shortness of breath  CHEST - 2 VIEW  Comparison: 04/20/2012  Findings: Cardiac shadow is stable.  There is improved aeration within both lungs although some patchy right upper lobe changes remain.  Mild blunting of the costophrenic angle on the left is seen but improved when compared with the prior study.  Postsurgical changes are again seen.  IMPRESSION: Improving aeration bilaterally with some persistent right upper lobe infiltrate.   Original Report Authenticated By: Alcide Clever, M.D.          Assessment:   1. Acute on chronic diastolic HF 2. Acute on chronic respiratory failure requiring VDRF during admission 3. CAD  s/p CABG/NSTEMI 4. Continued tobacco abuse 5. Hypokalemia, resolved. 6. Acute on chronic renal failure 7. Pneumonia, resolved after abx course 8. COPD - mod-severe 9. Afib with RVR, converted to NSR  Plan/Discussion:    Nathan [name] presented with  acute on chronic respiratory failure requiring intubation in setting of diastolic heart failure, PNA and COPD.  He feels much better after diuresis and treatment for PNA.  Volume status looks good.  Agree with lasix 80 mg twice daily adn spiro 12.5 mg daily.  It will be important for him to remain in NSR, continue cardizem for now.    Have spent an extensive amount of time educating the patient and his wife.  Discussed importance of daily weights, low sodium restrictions and fluid intake.  They voiced understanding.  He also knows the importance of med compliance.  Have suggested [facility] with home tele-monitoring as well as dietary education.  Have discussed when to use sliding scale lasix and call the clinic for issues.     Close follow up will be important to reduce hospital readmission.  Length of Stay: 5   Robbi Garter, [facility] 04/22/2012, 12:43 PM   Patient seen and examined with Ulyess Blossom, PA-C. We discussed all aspects of the encounter. I agree with the assessment and plan as stated above.   Nathan [name] has severe COPD with moderate AS and significant diastolic dysfunction. He has now been admitted several times with respiratory failure. He is now much improved after diuresis. He has little insight into his situation and is noncompliant with his diet and continue to smoke.   We spent an extensive amount of time educating him about need for fluid and dietary restriction as well as daily weight and sliding scale lasix regimen. We will see him early next week in HF clinic and gave him a number to contact prior to that should he notice swelling or more dyspnea. We will follow him closely in HF clinic. Thanks for referral.  Truman Hayward 4:38 PM

## 2012-04-22 NOTE — Progress Notes (Signed)
Physical Therapy Treatment Patient Details Name: Nathan [name] MRN: [id number] DOB: 1940-07-27 Today's Date: 04/22/2012 Time: 4098-1191 PT Time Calculation (min): 23 min  PT Assessment / Plan / Recommendation Comments on Treatment Session  Moving slightly better today but still demonstrating higher level balance deficits of which he will benefit from HHPT. Educated pt on safety for home. Pt verbalizes understanding but impaired memory warrants concern for carry over at home.     Follow Up Recommendations  Home health PT;Supervision for mobility/OOB     Does the patient have the potential to tolerate intense rehabilitation     Barriers to Discharge        Equipment Recommendations  None recommended by PT    Recommendations for Other Services    Frequency Min 3X/week   Plan Discharge plan remains appropriate;Frequency remains appropriate    Precautions / Restrictions Precautions Precautions: Fall Restrictions Weight Bearing Restrictions: No       Mobility  Bed Mobility Supine to Sit: 6: Modified independent (Device/Increase time);HOB elevated (40 degrees) Transfers Transfers: Sit to Stand;Stand to Sit Sit to Stand: 5: Supervision Stand to Sit: 5: Supervision Ambulation/Gait Ambulation/Gait Assistance: 5: Supervision Ambulation Distance (Feet): 500 Feet Assistive device: None General Gait Details: improved ambulation with less lateral sway, see DGi for balance details Stairs: Yes Stairs Assistance: 4: Min guard Stair Management Technique: One rail Left;Forwards;Alternating pattern Number of Stairs: 5       PT Goals Acute Rehab PT Goals PT Goal: Sit to Stand - Progress: Progressing toward goal PT Goal: Stand to Sit - Progress: Progressing toward goal PT Goal: Ambulate - Progress: Progressing toward goal PT Goal: Up/Down Stairs - Progress: Progressing toward goal Additional Goals PT Goal: Additional Goal #1 - Progress: Progressing toward goal  Visit  Information  Last PT Received On: 04/22/12 Assistance Needed: +1    Subjective Data  Subjective: Im excited because Im going home today.  Patient Stated Goal: home   Cognition  Overall Cognitive Status: Impaired Area of Impairment: Safety/judgement;Memory Arousal/Alertness: Awake/alert Orientation Level: Appears intact for tasks assessed Behavior During Session: [facility] for tasks performed Safety/Judgement: Impulsive;Decreased safety judgement for tasks assessed    Balance  Static Standing Balance Static Standing - Balance Support: No upper extremity supported Standardized Balance Assessment Standardized Balance Assessment: Dynamic Gait Index Dynamic Gait Index Level Surface: Mild Impairment Change in Gait Speed: Mild Impairment Gait with Horizontal Head Turns: Mild Impairment Gait with Vertical Head Turns: Mild Impairment Gait and Pivot Turn: Mild Impairment Step Over Obstacle: Mild Impairment Step Around Obstacles: Mild Impairment Steps: Mild Impairment Total Score: 16   End of Session PT - End of Session Equipment Utilized During Treatment: Gait belt Activity Tolerance: Patient tolerated treatment well;Patient limited by fatigue (c/o lower extremity fatigue with walking) Patient left: in chair Nurse Communication: Mobility status   GP     [facility] HELEN 04/22/2012, 11:15 AM

## 2012-04-22 NOTE — Progress Notes (Signed)
Patient Name: Nathan [name] Date of Encounter: 04/22/2012    SUBJECTIVE: The patient has no complaints today. He has anorexia. His only oral intake today has been to scoops of sherbet. He denies dyspnea and chest pain. I've spoken to Anders Simmonds, [place], who states that from the pulmonary is standpoint he is ready for discharge.  TELEMETRY:  Currently in sinus rhythm and no recurrence of atrial fibrillation as he had 24-36 hours ago.: Filed Vitals:   04/21/12 2013 04/22/12 0529 04/22/12 0913 04/22/12 1027  BP: 123/61 121/65  127/64  Pulse: 81 82  77  Temp: 98.3 F (36.8 C) 98.9 F (37.2 C)  97.7 F (36.5 C)  TempSrc: Oral Oral  Oral  Resp: 20 20  20   Height:      Weight:  75.479 kg (166 lb 6.4 oz)    SpO2: 92% 92% 91% 94%    Intake/Output Summary (Last 24 hours) at 04/22/12 1223 Last data filed at 04/22/12 0855  Gross per 24 hour  Intake 367.83 ml  Output    925 ml  Net -557.17 ml    LABS: Basic Metabolic Panel:  Basename 04/22/12 0440 04/21/12 0520  NA 141 139  K 3.8 3.9  CL 99 96  CO2 30 32  GLUCOSE 96 102*  BUN 24* 24*  CREATININE 2.16* 2.05*  CALCIUM 9.9 9.8  MG -- --  PHOS -- --   CBC:  Basename 04/20/12 0525  WBC 9.0  NEUTROABS --  HGB 9.6*  HCT 31.0*  MCV 90.4  PLT 284     Radiology/Studies:   RADIOLOGY REPORT*  Clinical Data: Pulmonary edema, shortness of breath  CHEST - 2 VIEW  Comparison: 04/20/2012  Findings: Cardiac shadow is stable. There is improved aeration  within both lungs although some patchy right upper lobe changes  remain. Mild blunting of the costophrenic angle on the left is  seen but improved when compared with the prior study. Postsurgical  changes are again seen.  IMPRESSION:  Improving aeration bilaterally with some persistent right upper  lobe infiltrate.  Original Report Authenticated By: Alcide Clever, M.D.   Physical Exam: Blood pressure 127/64, pulse 77, temperature 97.7 F (36.5 C), temperature source Oral,  resp. rate 20, height 6\' 1"  (1.854 m), weight 75.479 kg (166 lb 6.4 oz), SpO2 94.00%. Weight change: -1.721 kg (-3 lb 12.7 oz)   The lungs reveal rales at the bases but improved from yesterday. No rhonchi are heard  A 2-3 of 6 systolic murmur is heard at the apex and into the left axilla consistent with mitral regurgitation.  Extremities reveal no edema.  ASSESSMENT:  1. Acute on chronic diastolic heart failure contributing to acute respiratory failure, now improved.  2. Coronary atherosclerotic heart disease with non-ST elevation MI, type II  3. Mitral regurgitation of moderate severity  4. Chronic kidney disease, stage III, influenced by degree of diuresis  5. 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 COPD with refusal to stop smoking  Plan:  1. The patient diuresed 900 cc on the current diuretic regimen over the past 24 hours. He is on furosemide 80 mg twice a  day and spironolactone 12.5 mg twice a day  2. He is high risk for recurrent heart failure in the social issues and his medical comorbidities. We need to the recommendations and evaluation from the heart failure team.  3. Depending upon their thoughts, he may be eligible for discharge later today. If he can be discharged  today we will recontact Mr. Tanja Port said he can execute discharge. If there is more that we need to do based upon the recommendations of the Advance team he may have to stay longer.  [name] 04/22/2012, 12:23 PM

## 2012-04-22 NOTE — Discharge Summary (Addendum)
Physician Discharge Summary     Patient ID: Nathan [name] MRN: [id number] DOB/AGE: 73-Oct-1942 73 y.o.  Admit date: 04/17/2012 Discharge date: 04/22/2012  Admission Diagnoses: Acute respiratory failure  Discharge Diagnoses:  Active Problems:  Hypertension  CAD (coronary artery disease)  Paroxysmal atrial flutter, (NSR on discharge 12/12)  COPD (chronic obstructive pulmonary disease)   acute on chronic renal failure. baseline creatinine ~1.45-2.1  Acute on chronic diastolic CHF (congestive heart failure)-->compensated on time of d/c 04/22/12  NSTEMI (non-ST elevated myocardial infarction)  Pulmonary edema (resolved)  Acute respiratory failure (resolved)  Mitral valvular regurgitation   Significant Hospital tests/ studies/ interventions and procedures  LINES / TUBES:  ETT 12/7 >>12/09   CULTURES:  Blood 12/7 > neg  Resp cx 12/7 >> neg  Pneumococcal Ag 12/7 >> negative   ANTIBIOTICS:  12/8 ceftaz>>>12/09  12/8 azithro>>>12/09  12/8 vanc>>>12/09  12/8 fotraz>>>12/09  12/9 levaquin>>>12/12   SIGNIFICANT EVENTS:  12/08 Fever  12/09 Fever, tolerated SBT, rash (?from fortaz, or vancomycin)   71 yo male smoker admitted on 04/17/2012 with acute respiratory failure with VDRF, NSTEMI, acute pulmonary edema. Had admit in November 2013 for NSTEMI>>family reports pt was not complying with medications or supplemental oxygen at home.  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 RESOLVED  Hx of smoking with reported hx of COPD. -Now off oxygen  Admitted to the ICU. Therapeutic interventions included mechanical ventilation from 12/7-12/9, sedation while on vent, bronchodilators, empiric antibiotics, and later diuresis. He has completed his antibiotic course, we have continued him on spiriva, and focus has  been to find therapeutic volume status balance, for which we have been working with Cardiology. From a pulmonary stand-point he is ready for d/c as of 12/12. We will send him home with instructions to continue spiriva and flutter valve. Also he will be set up for follow up appointments in our office.  Plan: Home on spiriva Encourage flutter F/u w/ Dr Delford Field Currently no need for oxygen  NSTEMI with acute pulmonary edema likely 2nd to demand ischemia from PNA and decompensated acute on chronic diastolic heart failure.  PAF Hx of CAD, HTN, A fib, PAD, Hyperlipidemia, Mitral regurgitation.  Admitted for acute respiratory failure as mentioned above. This was complicated by demand ischemia and PAF. For this we consulted Cardiology (Dr Katrinka Blazing). In collaboration with Dr Katrinka Blazing Mr Biedermann has had titration of his b-blocker regimen, as well as his diuretics. He is currently in NSR at time of discharge. Cardiology is concerned that compliance has been a major barrier to his over all health maintenance. We will discharge him to home on betablocker therapy and diuretics. He will need close cardiology follow up.  Plan Home on lopressor 12.5 bid Simvastatin Furosemide 80mg  bid, he is to check his weight daily, should he have > 3 lb weight gain he is to take extra dose of lasix. Aldactone 12.5 mg daily cardiazem CD 240mg  daily Aspirin 81 mg daily He has follow up at the heart failure clinic as well as with Dr Katrinka Blazing.   Acute on chronic renal failure. Recent Labs  Basename 04/22/12 0440 04/21/12 0520 04/20/12 0525   CREATININE 2.16* 2.05* 2.05*   Baseline scr 1.45 to 2.1. He is currently near baseline but on a 80mg  bid dosing regimen of lasix at time of discharge. He  will be discharged on the lasix regimen noted in the discharge med list as a result of final discussion of case w/ cardiology. He will require close out-pt follow up.    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  PLAN:  Monitor mental status This may be an issue for compliance   Pruritis with faint macular rash on back>>?reaction to fortaz or vancomycin. This is resolved.   Gout Home on allopurinol and colchicine   Discharge Exam: BP 130/79  Pulse 79  Temp 97.7 F (36.5 C) (Oral)  Resp 20  Ht 6\' 1"  (1.854 m)  Wt 75.479 kg (166 lb 6.4 oz)  BMI 21.95 kg/m2  SpO2 95%  PHYSICAL EXAMINATION:  General: No distress  Neuro: Follows commands  HEENT: No sinus tenderness  Cardiovascular: s1s2 regular, 2/6 SM  Lungs: prolonged exhalation, no wheeze, no rales  Abdomen: Soft, non tender  Musculoskeletal: No edema  Skin: No rash   Labs at discharge Lab Results  Component Value Date   CREATININE 2.16* 04/22/2012   BUN 24* 04/22/2012   NA 141 04/22/2012   K 3.8 04/22/2012   CL 99 04/22/2012   CO2 30 04/22/2012   Lab Results  Component Value Date   WBC 9.0 04/20/2012   HGB 9.6* 04/20/2012   HCT 31.0* 04/20/2012   MCV 90.4 04/20/2012   PLT 284 04/20/2012   Lab Results  Component Value Date   ALT 14 04/03/2012   AST 20 04/03/2012   ALKPHOS 72 04/03/2012   BILITOT 0.3 04/03/2012   Lab Results  Component Value Date   INR 0.99 04/03/2012   INR 1.38 11/03/2011   INR 1.57* 10/31/2011    Current radiology studies Dg Chest 2 View  04/22/2012  *RADIOLOGY REPORT*  Clinical Data:  Pulmonary edema, shortness of breath  CHEST - 2 VIEW  Comparison: 04/20/2012  Findings: Cardiac shadow is stable.  There is improved aeration within both lungs although some patchy right upper lobe changes remain.  Mild blunting of the costophrenic angle on the left is seen but improved when compared with the prior study.  Postsurgical changes are again seen.  IMPRESSION: Improving aeration bilaterally with some persistent right upper lobe infiltrate.   Original Report Authenticated  By: Alcide Clever, M.D.     Disposition:  06-Home-Health Care Svc      Discharge Orders    Future Appointments: Provider: Department: Dept Phone: Center:   04/23/2012 1:15 PM [facility] [phone] None   04/26/2012 1:15 PM [facility] Monitor 7 [facility]ARD[facility]AC REH[facility] [phone] None   04/28/2012 1:15 PM [facility]Phase[facility] Monitor 7 [facility] [phone] None   04/29/2012 11:15 AM Storm Frisk, MD [facility] [place] [phone] None   04/30/2012 9:15 AM [facility] [phone] None   04/30/2012 1:15 PM Mc-Phase2 Monitor 7 [facility] CARDIAC REH[facility] [phone] None   05/03/2012 1:15 PM Mc-Phase2 Monitor 7 [facility] CARDIAC REHAB [phone] None   05/07/2012 1:15 PM Mc-Phase2 Monitor 7 [facility] CARDIAC REHAB [phone] None   05/10/2012 1:15 PM Mc-Phase2 Monitor 7 [facility] CARDIAC REH[facility] [phone] None   05/14/2012 1:15 PM [facility]Phase2 Monitor 7 [facility] CARDIAC  REHAB [phone] None   05/17/2012 1:15 PM Mc-Phase2 Monitor 7 [facility] CARDIAC REHAB [phone] None   05/19/2012 1:15 PM Mc-Phase2 Monitor 7 [facility] CARDIAC REHAB [phone] None   05/21/2012 1:15 PM Mc-Phase2 Monitor 7 [facility] CARDIAC REHAB [phone] None   05/24/2012 1:15 PM Mc-Phase2 Monitor 7 [facility] CARDIAC REHAB [phone] None   05/26/2012 1:15 PM Mc-Phase2 Monitor 7 [facility] CARDIAC REHAB [phone] None   05/28/2012 1:15 PM Mc[facility]Phase2 Monitor 7 [facility] CARDIAC REH[facility] [phone] None   05/31/2012 6:45 AM Mc-Phase2 Monitor 6 [facility] CARDIAC REHAB [phone] None   05/31/2012 2:00 PM [facility]Lab Lab 5 [facility] [phone] VVS   05/31/2012 3:00 PM Nada Libman, MD [facility] [phone] VVS   06/02/2012 1:15 PM Mc-Phase2 Monitor 7 [facility] CARDIAC REHAB [phone] None   06/04/2012 1:15 PM Mc-Phase2 Monitor 7 [facility] CARDIAC REHAB [phone] None   06/07/2012 1:15 PM Mc-Phase2 Monitor 7 [facility] CARDIAC REHAB [phone] None   06/09/2012 1:15 PM Mc-Phase2 Monitor 7 [facility] CARDIAC REHAB [phone] None   06/11/2012 1:15 PM Mc-Phase2 Monitor 7 [facility] CARDIAC REHAB [phone] None   06/14/2012 1:15 PM Mc-Phase2 Monitor 7 [facility] CARDIAC REHAB [phone] None   06/16/2012 1:15 PM Mc-Phase2 Monitor 7 [facility] CARDIAC REHAB [phone] None   06/18/2012 1:15 PM Mc-Phase2 Monitor 7 [facility] CARDIAC REHAB [phone] None   06/21/2012 1:15 PM Mc-Phase2 Monitor 7 [facility] CARDIAC REHAB [phone] None   06/23/2012 1:15 PM Mc-Phase2 Monitor 7 [facility] CARDIAC REHAB [phone] None   06/25/2012 1:15 PM Mc-Phase2 Monitor 7 [facility] CARDIAC REHAB [phone] None   06/28/2012 1:15 PM Mc-Phase2 Monitor 7 [facility] CARDIAC REHAB [phone] None   06/30/2012 1:15 PM Mc-Phase2 Monitor 7 [facility] CARDIAC REHAB [phone] None   07/02/2012 1:15 PM Mc-Phase2 Monitor 7 [facility] CARDIAC REHAB [phone] None   07/05/2012 1:15 PM Mc-Phase2 Monitor 7 [facility] CARDIAC REHAB [phone] None   07/07/2012 1:15 PM Mc-Phase2 Monitor 7 [facility] CARDIAC REHAB [phone] None   07/09/2012 1:15 PM Mc-Phase2 Monitor 7 [facility] CARDIAC REHAB [phone] None     Future Orders Please Complete By Expires   Diet - low sodium  heart healthy      Increase activity slowly      Call MD for:  temperature >100.4      Call MD for:  severe uncontrolled pain      Discharge instructions      Comments:   Refer to heart failure packet Daily weight check Write weight down       Medication List     As of 04/22/2012  4:09 PM    STOP taking these medications         amLODipine 10 MG tablet   Commonly known as: NORVASC      aspirin 325 MG tablet      buPROPion 150 MG 24 hr tablet   Commonly known as: WELLBUTRIN XL      DOXYLAMINE SUCCINATE (SLEEP) PO      TAKE these medications         acetaminophen 325 MG tablet   Commonly known as: TYLENOL  Take 650 mg by mouth every 6 (six) hours as needed. For pain      allopurinol 100 MG tablet   Commonly known as: ZYLOPRIM   Take 200 mg by mouth daily.      aspirin 81 MG chewable tablet   Place 1 tablet (81 mg total) into feeding tube daily.      COLCRYS 0.6 MG tablet   Generic drug: colchicine   Take 0.6 mg by mouth daily.      CULTURELLE DIGESTIVE HEALTH Caps   Take 1 capsule by mouth daily.      diltiazem 240 MG 24 hr capsule   Commonly known as: CARDIZEM CD   Take 1 capsule (240 mg total) by mouth daily.      furosemide 80 MG tablet   Commonly known as: LASIX   Take 1 tablet (80 mg total) by mouth 2 (two) times daily.      Melatonin 3 MG Tabs   Take 2 tablets by mouth at bedtime.      metoprolol tartrate 12.5 mg Tabs   Commonly known as: LOPRESSOR   Take 0.5 tablets (12.5 mg total) by mouth 2 (two) times daily.      nitroGLYCERIN 0.4 MG SL tablet   Commonly known as: NITROSTAT   Place 1 tablet (0.4 mg total) under the tongue every 5 (five) minutes x 3 doses as needed for chest pain.      omeprazole 20 MG capsule   Commonly known as: PRILOSEC   Take 1 capsule (20 mg total) by mouth daily. 1/2 before food      simvastatin 10 MG tablet   Commonly known as: ZOCOR   Take 10 mg by mouth at bedtime.      spironolactone 12.5 mg Tabs    Commonly known as: ALDACTONE   Take 0.5 tablets (12.5 mg total) by mouth 2 (two) times daily.      tiotropium 18 MCG inhalation capsule   Commonly known as: SPIRIVA   Place 1 capsule (18 mcg total) into inhaler and inhale daily.         Follow-up Information    Follow up with Shan Levans, MD. On 04/29/2012. (at 11am )    Contact information:   [address]       Follow up with Arvilla Meres, MD. On 04/30/2012. (at 9:15a  [employer] Code [id number]))    Contact information:   [facility] [address] [place] [address] [phone]       Follow up with Lesleigh Noe, MD. On 05/10/2012. (910 am  (you will see NP Emelda Fear) )    Contact information:   [address] [place] [address] [phone]          Discharged Condition:good. Ambulatory in hall w/ minimal dyspnea  Physician Statement:     The Patient was personally examined, the discharge assessment and plan has been personally reviewed .    > 30 minutes of time have been dedicated to discharge assessment, planning and discharge instructions.   Signed: Shan Levans MD Beeper [phone] 04/22/2012, 4:09 PM

## 2012-04-22 NOTE — Plan of Care (Signed)
Problem: Not Ready for Diet/Lifestyle Change (NB-1.3) Goal: Nutrition education Formal process to instruct or train a patient/client in a skill or to impart knowledge to help patients/clients voluntarily manage or modify food choices and eating behavior to maintain or improve health.  Outcome: Not Met (add Reason) RD consulted for diet education. Pt refused diet teaching at this time. Provided heart healthy hand out for pt to review if he changes his mind.   Clarene Duke RD, LDN Pager [phone] After Hours pager [phone]

## 2012-04-22 NOTE — Progress Notes (Signed)
PULMONARY  / CRITICAL CARE MEDICINE  Name: Nathan [name] MRN: [id number] DOB: 02-28-1941    LOS: 5  REFERRING MD :  [facility] Emergency dept   CHIEF COMPLAINT:  Pulmonary Edema, VDRF  BRIEF PATIENT DESCRIPTION: 71 yo male smoker admitted on 04/17/2012 with acute respiratory failure with VDRF, NSTEMI, acute pulmonary edema.  Had admit in November 2013 for NSTEMI>>family reports pt was not complying with medications or supplemental oxygen at home.  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 2013, A fib, HTN, PAD, CVA, GERD, Gout, Hyperlipidemia, mild/mod MR (EF 55% from Echo 04/03/12)  LINES / TUBES: ETT 12/7 >>12/09  CULTURES: Blood 12/7 > neg Resp cx 12/7 >> neg Pneumococcal Ag 12/7 >> negative  ANTIBIOTICS: 12/8 ceftaz>>>12/09 12/8 azithro>>>12/09 12/8 vanc>>>12/09 12/8 fotraz>>>12/09 12/9 levaquin>>>12/12  SIGNIFICANT EVENTS:  12/08 Fever 12/09 Fever, tolerated SBT, rash (?from fortaz, or vancomycin)   LEVEL OF CARE:  ICU PRIMARY SERVICE:  PCCM CONSULTANTS:  [facility] CODE STATUS: Full DIET:  Full DVT Px: Lovenox GI Px:  Protonix   INTERVAL HISTORY:  Less cough , less dyspnea.  Now on RA on tele unit  VITAL SIGNS: Temp:  [98.2 F (36.8 C)-98.9 F (37.2 C)] 98.9 F (37.2 C) (12/12 0529) Pulse Rate:  [75-88] 82  (12/12 0529) Resp:  [19-20] 20  (12/12 0529) BP: (102-123)/(61-74) 121/65 mmHg (12/12 0529) SpO2:  [92 %-99 %] 92 % (12/12 0529) Weight:  [75.479 kg (166 lb 6.4 oz)] 75.479 kg (166 lb 6.4 oz) (12/12 0529)    ON RA sats ok   INTAKE / OUTPUT: Intake/Output      12/11 0701 - 12/12 0700 12/12 0701 - 12/13 0700   P.O. 240    I.V. (mL/kg) 127.8 (1.7)    Total Intake(mL/kg) 367.8 (4.9)    Urine (mL/kg/hr) 1325 (0.7)    Total Output 1325    Net -957.2           PHYSICAL EXAMINATION: General:  No distress Neuro: Follows commands HEENT:  No sinus tenderness Cardiovascular:  s1s2 regular, 2/6 SM Lungs: prolonged exhalation, no wheeze, no  rales Abdomen:  Soft, non tender Musculoskeletal:  No edema Skin:  No rash   LABS: Cbc  Lab 04/20/12 0525 04/19/12 0530 04/18/12 0520  WBC 9.0 -- --  HGB 9.6* 8.6* 8.9*  HCT 31.0* 27.6* 28.6*  PLT 284 256 276   Chemistry   Lab 04/22/12 0440 04/21/12 0520 04/20/12 0525 04/18/12 0520  NA 141 139 141 --  K 3.8 3.9 3.7 --  CL 99 96 97 --  CO2 30 32 31 --  BUN 24* 24* 26* --  CREATININE 2.16* 2.05* 2.05* --  CALCIUM 9.9 9.8 9.6 --  MG -- -- -- 2.0  PHOS -- -- -- 5.2*  GLUCOSE 96 102* 104* --    Liver fxn No results found for this basename: AST:3,ALT:3,ALKPHOS:3,BILITOT:3,PROT:3,ALBUMIN:3 in the last 168 hours coags No results found for this basename: APTT:3,INR:3 in the last 168 hours Sepsis markers  Lab 04/19/12 0530 04/18/12 1235 04/17/12 0809 04/17/12 0803  LATICACIDVEN -- -- -- 1.3  PROCALCITON 1.39 2.14 0.22 --   Cardiac markers  Lab 04/17/12 1902 04/17/12 1127 04/17/12 0545  CKTOTAL -- -- --  CKMB -- -- --  TROPONINI 0.82* 0.72* 0.50*   BNP  Lab 04/21/12 0520 04/18/12 0520 04/17/12 0545  PROBNP 8174.0* 5239.0* 8088.0*   ABG  Lab 04/18/12 0448 04/17/12 0737 04/17/12 0612  PHART 7.323* 7.291* 7.151*  PCO2ART 48.2* 48.2* 66.8*  PO2ART 74.4* 75.0* 275.0*  HCO3 24.3* 23.2 23.3  TCO2 25.7 25 25     CBG trend  Lab 04/19/12 1211 04/19/12 0514 04/18/12 2355 04/18/12 1600 04/18/12 1227  GLUCAP 140* 132* 115* 93 101*    IMAGING: 12/12: resolved edema on cxr  DIAGNOSES: Active Problems:  Hypertension  CAD (coronary artery disease)  Paroxysmal atrial flutter  COPD (chronic obstructive pulmonary disease)  Acute renal failure  Acute on chronic diastolic CHF (congestive heart failure)  NSTEMI (non-ST elevated myocardial infarction)  Pulmonary edema  Respiratory failure, acute-on-chronic   ASSESSMENT / PLAN:  PULMONARY  ASSESSMENT:  Acute respiratory failure 2nd to PNA, acute pulmonary edema.RESOLVED Hx of smoking with reported hx of COPD.Now  off oxygen Cont IS and flutter valve Spiriva daily   CARDIOVASCULAR  ASSESSMENT:  NSTEMI with acute pulmonary edema likely 2nd to demand ischemia from PNA. Hx of CAD, HTN, A fib, PAD, Hyperlipidemia, Mitral regurgitation.  PLAN:   Lovenox for DVT prophylaxis Cont  po lasix Continue ASA, simvastatin Okay to resume lopressor >> defer timing to cardiology Change to daily dose cardiazem Plans per cardiology  RENAL  ASSESSMENT:   Acute on chronic renal failure. Creatinine stable  Hyperkalemia. Resolved.  PLAN:   Monitor renal fx, urine outpt, electrolytes  GASTROINTESTINAL  ASSESSMENT:   Hx of GERD. Nutrition. PLAN:   Continue protonix Continue diet  HEMATOLOGIC  ASSESSMENT:   Anemia or critical illness.  PLAN:  F/u CBC No tfx unless hgb </= 9.0  INFECTIOUS  ASSESSMENT:   PNA with recent hospital admit. Persistent fever 12/09 now resolved 12/12 PLAN:   D/c  levaquin  ENDOCRINE  ASSESSMENT:   Hx of Gout.  PLAN:   Continue allopurinol, colchicine  NEUROLOGIC  ASSESSMENT:   ?mild dementia. PLAN:   Monitor mental status  DERMATOLOGY  ASSESSMENT: Pruritis with faint macular rash on back>>?reaction to fortaz or vancomycin. RESOLVED  PLAN: PRN benadryl   OK to d/c home today 12/12 with PCCM if ok with Cardiology.   Shan Levans Beeper  [phone]  Cell  [phone]  If no response or cell goes to voicemail, call beeper [phone]] [facility] 04/22/2012, 8:00 AM

## 2012-04-23 ENCOUNTER — Encounter (HOSPITAL_COMMUNITY): Payer: Medicare PPO

## 2012-04-23 ENCOUNTER — Ambulatory Visit (HOSPITAL_COMMUNITY): Payer: Medicare PPO

## 2012-04-23 LAB — CULTURE, BLOOD (ROUTINE X 2): Culture: NO GROWTH

## 2012-04-24 LAB — CULTURE, BLOOD (ROUTINE X 2)
Culture: NO GROWTH
Culture: NO GROWTH

## 2012-04-26 ENCOUNTER — Ambulatory Visit (HOSPITAL_COMMUNITY): Payer: Medicare PPO

## 2012-04-26 ENCOUNTER — Encounter (HOSPITAL_COMMUNITY): Payer: Medicare PPO

## 2012-04-28 ENCOUNTER — Ambulatory Visit (HOSPITAL_COMMUNITY): Payer: Medicare PPO

## 2012-04-28 ENCOUNTER — Encounter (HOSPITAL_COMMUNITY): Payer: Medicare PPO

## 2012-04-29 ENCOUNTER — Encounter: Payer: Self-pay | Admitting: Critical Care Medicine

## 2012-04-29 ENCOUNTER — Ambulatory Visit (HOSPITAL_BASED_OUTPATIENT_CLINIC_OR_DEPARTMENT_OTHER)
Admission: RE | Admit: 2012-04-29 | Discharge: 2012-04-29 | Disposition: A | Discharge status: Home / Self Care | Payer: Medicare PPO | Source: Ambulatory Visit | Attending: Critical Care Medicine | Admitting: Critical Care Medicine

## 2012-04-29 ENCOUNTER — Ambulatory Visit (INDEPENDENT_AMBULATORY_CARE_PROVIDER_SITE_OTHER): Payer: Medicare PPO | Admitting: Critical Care Medicine

## 2012-04-29 VITALS — BP 112/64 | HR 69 | Ht 70.0 in | Wt 176.0 lb

## 2012-04-29 DIAGNOSIS — I509 Heart failure, unspecified: Secondary | ICD-10-CM

## 2012-04-29 DIAGNOSIS — J811 Chronic pulmonary edema: Secondary | ICD-10-CM | POA: Insufficient documentation

## 2012-04-29 DIAGNOSIS — J810 Acute pulmonary edema: Secondary | ICD-10-CM

## 2012-04-29 DIAGNOSIS — J449 Chronic obstructive pulmonary disease, unspecified: Secondary | ICD-10-CM | POA: Insufficient documentation

## 2012-04-29 DIAGNOSIS — J439 Emphysema, unspecified: Secondary | ICD-10-CM

## 2012-04-29 DIAGNOSIS — J984 Other disorders of lung: Secondary | ICD-10-CM | POA: Insufficient documentation

## 2012-04-29 DIAGNOSIS — Z951 Presence of aortocoronary bypass graft: Secondary | ICD-10-CM | POA: Insufficient documentation

## 2012-04-29 DIAGNOSIS — J189 Pneumonia, unspecified organism: Secondary | ICD-10-CM

## 2012-04-29 DIAGNOSIS — F17200 Nicotine dependence, unspecified, uncomplicated: Secondary | ICD-10-CM

## 2012-04-29 DIAGNOSIS — I5032 Chronic diastolic (congestive) heart failure: Secondary | ICD-10-CM

## 2012-04-29 DIAGNOSIS — J4489 Other specified chronic obstructive pulmonary disease: Secondary | ICD-10-CM | POA: Insufficient documentation

## 2012-04-29 DIAGNOSIS — Z23 Encounter for immunization: Secondary | ICD-10-CM

## 2012-04-29 DIAGNOSIS — J438 Other emphysema: Secondary | ICD-10-CM

## 2012-04-29 MED ORDER — VARENICLINE TARTRATE 0.5 MG PO TABS: 0.5000 mg | ORAL_TABLET | Freq: Two times a day (BID) | ORAL | Status: DC

## 2012-04-29 NOTE — Assessment & Plan Note (Signed)
Chronic obstructive lung disease gold stage C. with ongoing smoking use History of acute pulmonary edema with community acquired pneumonia and non-ST segment myocardial infarction with associated ventilatory failure Pulmonary edema and ventilatory failure now improved and the patient is not in active heart failure at this time Chest x-ray shows scarring right upper lobe otherwise clear Plan Greater than 10 minutes of smoking cessation counseling was issued to the patient A prescription for Chantix was given for smoking cessation Patient is to remain on Spiriva daily Patient is encouraged to keep her followup with cardiology that is established A flu vaccine was administered

## 2012-04-29 NOTE — Patient Instructions (Addendum)
Flu vaccine was given Start chantix to quit smoking Keep appts with Cardiology this month Stay on spiriva Chest xray today on the way out Return 2 months in [facility]

## 2012-04-29 NOTE — Progress Notes (Signed)
Subjective:    Patient ID: Nathan [name], male    DOB: 1940/11/12, 71 y.o.   MRN: [id number]  HPI 71 y.o.WM This patient was hospitalized between the seventh and 12th of December. The patient had acute pulmonary edema, community acquired pneumonia, acute on chronic diastolic heart failure, acute on chronic renal failure, COPD exacerbation, and paroxysmal atrial flutter. The patient was on mechanical ventilation for 2 days. The patient was discharged home on a Spiriva inhaler. Patient had medications adjusted for pulmonary edema and atrial fibrillation and flutter. Patient was maintained on Lasix on discharge. Discharge creatinine was 2.16. Baseline is 1.5. Patient had mild anemia upon discharge. The patient is scheduled to return to the heart failure clinic.  Currently the patient is improved. There is no cough. There is no dyspnea. There is no chest pain. Patient is still smoking a pack a day of cigarettes. Her weight has been stable between 170 and 169 pounds. There is no peripheral edema. There is no excess mucus. Pt denies any significant sore throat, nasal congestion or excess secretions, fever, chills, sweats, unintended weight loss, pleurtic or exertional chest pain, orthopnea PND, or leg swelling Pt denies any increase in rescue therapy over baseline, denies waking up needing it or having any early am or nocturnal exacerbations of coughing/wheezing/or dyspnea. Pt also denies any obvious fluctuation in symptoms with  weather or environmental change or other alleviating or aggravating factors   Past Medical History  Diagnosis Date  . Aorto-iliac disease     bilat with stent 2007  . CAD (coronary artery disease)     with loop to the circumflex 2000, inferolateral infarction  . Hypertension   . Paroxysmal atrial flutter     ablation  . ED (erectile dysfunction)   . COPD (chronic obstructive pulmonary disease)   . Hyperlipidemia   . Stroke   . Shortness of breath   . GERD  (gastroesophageal reflux disease)   . Gout      Family History  Problem Relation Age of Onset  . Cancer Mother      History   Social History  . Marital Status: Married    Spouse Name: N/A    Number of Children: N/A  . Years of Education: N/A   Occupational History  . retired    Social History Main Topics  . Smoking status: Current Some Day Smoker -- 0.5 packs/day for 64 years    Types: Cigarettes  . Smokeless tobacco: Never Used  . 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 Drug Use: No  . Sexually Active: Not on file   Other Topics Concern  . Not on file   Social History Narrative  . No narrative on file     No Known Allergies   Outpatient Prescriptions Prior to Visit  Medication Sig Dispense Refill  . acetaminophen (TYLENOL) 325 MG tablet Take 650 mg by mouth every 6 (six) hours as needed. For pain      . allopurinol (ZYLOPRIM) 100 MG tablet Take 200 mg by mouth daily.      . colchicine (COLCRYS) 0.6 MG tablet Take 0.6 mg by mouth daily.      Marland Kitchen diltiazem (CARDIZEM CD) 240 MG 24 hr capsule Take 1 capsule (240 mg total) by mouth daily.  30 capsule  6  . furosemide (LASIX)  80 MG tablet Take 1 tablet (80 mg total) by mouth 2 (two) times daily.  90 tablet  6  . Lactobacillus-Inulin (CULTURELLE DIGESTIVE HEALTH) CAPS Take 1 capsule by mouth daily.      . Melatonin 3 MG TABS Take 2 tablets by mouth at bedtime.      . nitroGLYCERIN (NITROSTAT) 0.4 MG SL tablet Place 1 tablet (0.4 mg total) under the tongue every 5 (five) minutes x 3 doses as needed for chest pain.  25 tablet  5  . omeprazole (PRILOSEC) 20 MG capsule Take 1 capsule (20 mg total) by mouth daily. 1/2 before food      . simvastatin (ZOCOR) 10 MG tablet Take 10 mg by mouth at bedtime.      Marland Kitchen spironolactone (ALDACTONE) 12.5 mg TABS Take 0.5 tablets (12.5 mg total) by mouth 2 (two) times daily.  30  tablet  6  . tiotropium (SPIRIVA) 18 MCG inhalation capsule Place 1 capsule (18 mcg total) into inhaler and inhale daily.  30 capsule  6  . [DISCONTINUED] aspirin 81 MG chewable tablet Place 1 tablet (81 mg total) into feeding tube daily.      . [DISCONTINUED] metoprolol tartrate (LOPRESSOR) 12.5 mg TABS Take 0.5 tablets (12.5 mg total) by mouth 2 (two) times daily.  60 tablet  6   Last reviewed on 04/29/2012 12:22 PM by Storm Frisk, MD   Review of Systems Constitutional:   No  weight loss, night sweats,  Fevers, chills, fatigue, lassitude. HEENT:   No headaches,  Difficulty swallowing,  Tooth/dental problems,  Sore throat,                No sneezing, itching, ear ache, nasal congestion, post nasal drip,   CV:  No chest pain,  Orthopnea, PND, swelling in lower extremities, anasarca, dizziness, palpitations  GI  No heartburn, indigestion, abdominal pain, nausea, vomiting, diarrhea, change in bowel habits, loss of appetite  Resp: Notes  shortness of breath with exertion not at rest.  No excess mucus, no productive cough,  No non-productive cough,  No coughing up of blood.  No change in color of mucus.  No wheezing.  No chest wall deformity  Skin: no rash or lesions.  GU: no dysuria, change in color of urine, no urgency or frequency.  No flank pain.  MS:  No joint pain or swelling.  No decreased range of motion.  No back pain.  Psych:  No change in mood or affect. No depression or anxiety.  No memory loss.     Objective:   Physical Exam Filed Vitals:   04/29/12 1153  BP: 112/64  Pulse: 69  Height: 5\' 10"  (1.778 m)  Weight: 176 lb (79.833 kg)  SpO2: 100%    Gen: Pleasant, well-nourished, in no distress,  normal affect  ENT: No lesions,  mouth clear,  oropharynx clear, no postnasal drip  Neck: No JVD, no TMG, no carotid bruits  Lungs: No use of accessory muscles, no dullness to percussion, distant breath sounds  Cardiovascular: RRR, heart sounds normal, no murmur or  gallops, no peripheral edema  Abdomen: soft and NT, no HSM,  BS normal  Musculoskeletal: No deformities, no cyanosis or clubbing  Neuro: alert, non focal  Skin: Warm, no lesions or rashes  Chest x-ray obtained on 04/29/2012 reveals no pulmonary edema and minimal scar right upper lobe and COPD changes.        Assessment & Plan:   COPD (chronic obstructive pulmonary disease) Chronic obstructive  lung disease gold stage C. with ongoing smoking use History of acute pulmonary edema with community acquired pneumonia and non-ST segment myocardial infarction with associated ventilatory failure Pulmonary edema and ventilatory failure now improved and the patient is not in active heart failure at this time Chest x-ray shows scarring right upper lobe otherwise clear Plan Greater than 10 minutes of smoking cessation counseling was issued to the patient A prescription for Chantix was given for smoking cessation Patient is to remain on Spiriva daily Patient is encouraged to keep her followup with cardiology that is established A flu vaccine was administered   Tobacco use disorder Ongoing nicotine use Greater than 10 minutes of smoking cessation counseling was issued to the patient Chantix was prescribed as well  Chronic diastolic CHF (congestive heart failure)  Stable diastolic heart failure The patient is to keep appointment with CHF clinic   Updated Medication List Outpatient Encounter Prescriptions as of 04/29/2012  Medication Sig Dispense Refill  . acetaminophen (TYLENOL) 325 MG tablet Take 650 mg by mouth every 6 (six) hours as needed. For pain      . allopurinol (ZYLOPRIM) 100 MG tablet Take 200 mg by mouth daily.      Marland Kitchen aspirin 81 MG chewable tablet Chew 81 mg by mouth daily.      . colchicine (COLCRYS) 0.6 MG tablet Take 0.6 mg by mouth daily.      Marland Kitchen diltiazem (CARDIZEM CD) 240 MG 24 hr capsule Take 1 capsule (240 mg total) by mouth daily.  30 capsule  6  . furosemide  (LASIX) 80 MG tablet Take 1 tablet (80 mg total) by mouth 2 (two) times daily.  90 tablet  6  . Lactobacillus-Inulin (CULTURELLE DIGESTIVE HEALTH) CAPS Take 1 capsule by mouth daily.      . Melatonin 3 MG TABS Take 2 tablets by mouth at bedtime.      . nitroGLYCERIN (NITROSTAT) 0.4 MG SL tablet Place 1 tablet (0.4 mg total) under the tongue every 5 (five) minutes x 3 doses as needed for chest pain.  25 tablet  5  . omeprazole (PRILOSEC) 20 MG capsule Take 1 capsule (20 mg total) by mouth daily. 1/2 before food      . simvastatin (ZOCOR) 10 MG tablet Take 10 mg by mouth at bedtime.      Marland Kitchen spironolactone (ALDACTONE) 12.5 mg TABS Take 0.5 tablets (12.5 mg total) by mouth 2 (two) times daily.  30 tablet  6  . tiotropium (SPIRIVA) 18 MCG inhalation capsule Place 1 capsule (18 mcg total) into inhaler and inhale daily.  30 capsule  6  . [DISCONTINUED] aspirin 81 MG chewable tablet Place 1 tablet (81 mg total) into feeding tube daily.      . varenicline (CHANTIX) 0.5 MG tablet Take 1 tablet (0.5 mg total) by mouth 2 (two) times daily.  60 tablet  2  . [DISCONTINUED] metoprolol tartrate (LOPRESSOR) 12.5 mg TABS Take 0.5 tablets (12.5 mg total) by mouth 2 (two) times daily.  60 tablet  6

## 2012-04-29 NOTE — Assessment & Plan Note (Signed)
Stable diastolic heart failure The patient is to keep appointment with CHF clinic

## 2012-04-29 NOTE — Assessment & Plan Note (Signed)
Ongoing nicotine use Greater than 10 minutes of smoking cessation counseling was issued to the patient Chantix was prescribed as well

## 2012-04-29 NOTE — Progress Notes (Signed)
Quick Note:  Called, spoke with pt. Informed him of cxr results and recs per Dr. Delford Field. He verbalized understanding of this and voiced no further questions or concerns at this time. ______

## 2012-04-29 NOTE — Progress Notes (Signed)
Quick Note:  Notify the patient that the Xray is stable and no pneumonia No change in medications are recommended. Continue current meds as prescribed at last office visit ______ 

## 2012-04-30 ENCOUNTER — Ambulatory Visit (HOSPITAL_COMMUNITY): Payer: Medicare PPO

## 2012-04-30 ENCOUNTER — Encounter (HOSPITAL_COMMUNITY): Payer: Medicare PPO

## 2012-04-30 ENCOUNTER — Telehealth: Payer: Self-pay | Admitting: *Deleted

## 2012-04-30 ENCOUNTER — Ambulatory Visit (HOSPITAL_COMMUNITY)
Admit: 2012-04-30 | Discharge: 2012-04-30 | Disposition: A | Discharge status: Home / Self Care | Payer: Medicare PPO | Attending: Internal Medicine | Admitting: Internal Medicine

## 2012-04-30 VITALS — BP 116/60 | HR 72 | Wt 173.8 lb

## 2012-04-30 DIAGNOSIS — I5032 Chronic diastolic (congestive) heart failure: Secondary | ICD-10-CM

## 2012-04-30 DIAGNOSIS — I2510 Atherosclerotic heart disease of native coronary artery without angina pectoris: Secondary | ICD-10-CM

## 2012-04-30 DIAGNOSIS — J449 Chronic obstructive pulmonary disease, unspecified: Secondary | ICD-10-CM

## 2012-04-30 DIAGNOSIS — F17200 Nicotine dependence, unspecified, uncomplicated: Secondary | ICD-10-CM

## 2012-04-30 DIAGNOSIS — I509 Heart failure, unspecified: Secondary | ICD-10-CM

## 2012-04-30 DIAGNOSIS — J4489 Other specified chronic obstructive pulmonary disease: Secondary | ICD-10-CM

## 2012-04-30 LAB — BASIC METABOLIC PANEL
Calcium: 10.3 mg/dL (ref 8.4–10.5)
GFR calc Af Amer: 31 mL/min — ABNORMAL LOW (ref >90)
GFR calc non Af Amer: 26 mL/min — ABNORMAL LOW (ref >90)
Glucose, Bld: 106 mg/dL — ABNORMAL HIGH (ref 70–99)
Potassium: 4.3 mEq/L (ref 3.5–5.1)
Sodium: 137 mEq/L (ref 135–145)

## 2012-04-30 NOTE — Patient Instructions (Addendum)
You have been referred to Pulmonary Rehab, they will contact you in about 2-3 weeks to schedule an appoint  Please attend our diet class on Jan 21 at 2 pm  Your physician recommends that you schedule a follow-up appointment in: 3-4 weeks

## 2012-04-30 NOTE — Progress Notes (Signed)
Patient ID: Nathan [name], male   DOB: [birth year], 71 y.o.   MRN: [id number]   Weight Range   Baseline proBNP    Primary Cardiologist: Garnette Scheuermann, MD PCP: Kirby Funk, MD  HPI:  Nathan [name] is a 71 y.o. gentlemen with a cardiac history that includes CAD s/p CABG x4 (LIMA-LAD, SVG-Diag, SVG-OM1 and SVG-PDA) in June 2013, AFL s/p ablation,  diastolic heart failure, hypertension. He also has history of severe COPD previously on home O2, chronic renal failure (baseline Cr 1.5-1.7), stroke and GERD.   Had lost a lot of weight since CABG and had FTT.  He was admitted in November 2013 for NSTEMI, acute respiratory failure and altered mental status. He underwent emergent catheterization showed patent SVG-RCA and LIMA-LAD but total occlusion of SVG-OM as well as SVG-diag. Medical therapy was recommended. He was set up for home health as well as home O2. He was discharged on 11/28 and returned with progressive dyspnea/respiratory failure. He would not allow his wife to give him medications 3 days prior to admit on 04/17/12. In the ER his sats did not improve on Bipap and he required intubation. Felt to have PNA +/- CHF.  Diuresed about 11 pounds.Weight at d/c was 166. Cr on d/c was 2.1  Echo 11/13; EF 50-55% with mild-mod MR  Here with his wife. He feels much better since discharge. Says he no longer needs O2. Gets SOB mild exertion. Feels like it has gotten worse over past few months. Still smoking a ppd. Doesn't feel like he can quit. Weighing himself every morning. Weight ranges 169-170. Appetite poor but starting to pick up.  No orthopnea, PND, edema or CP. Not watching a diet. Wife tries to give him healthy food but he doesn't eat it. For dinner last night had 2.5 strips of bacon with cheese and butter on a muffin.    ROS: All systems negative except as listed in HPI, PMH and Problem List.  Past Medical History  Diagnosis Date  . Aorto-iliac disease     bilat with stent 2007  . CAD (coronary  artery disease)     with loop to the circumflex 2000, inferolateral infarction  . Hypertension   . Paroxysmal atrial flutter     ablation  . ED (erectile dysfunction)   . COPD (chronic obstructive pulmonary disease)   . Hyperlipidemia   . Stroke   . Shortness of breath   . GERD (gastroesophageal reflux disease)   . Gout     Current Outpatient Prescriptions  Medication Sig Dispense Refill  . acetaminophen (TYLENOL) 325 MG tablet Take 650 mg by mouth every 6 (six) hours as needed. For pain      . allopurinol (ZYLOPRIM) 100 MG tablet Take 200 mg by mouth daily.      Marland Kitchen aspirin 81 MG chewable tablet Chew 81 mg by mouth daily.      . colchicine (COLCRYS) 0.6 MG tablet Take 0.6 mg by mouth daily.      Marland Kitchen diltiazem (CARDIZEM CD) 240 MG 24 hr capsule Take 1 capsule (240 mg total) by mouth daily.  30 capsule  6  . furosemide (LASIX) 80 MG tablet Take 1 tablet (80 mg total) by mouth 2 (two) times daily.  90 tablet  6  . Lactobacillus-Inulin (CULTURELLE DIGESTIVE HEALTH) CAPS Take 1 capsule by mouth daily.      . Melatonin 3 MG TABS Take 2 tablets by mouth at bedtime.      Marland Kitchen omeprazole (PRILOSEC) 20 MG  capsule Take 1 capsule (20 mg total) by mouth daily. 1/2 before food      . simvastatin (ZOCOR) 10 MG tablet Take 10 mg by mouth at bedtime.      Marland Kitchen spironolactone (ALDACTONE) 12.5 mg TABS Take 0.5 tablets (12.5 mg total) by mouth 2 (two) times daily.  30 tablet  6  . tiotropium (SPIRIVA) 18 MCG inhalation capsule Place 1 capsule (18 mcg total) into inhaler and inhale daily.  30 capsule  6  . varenicline (CHANTIX) 0.5 MG tablet Take 1 tablet (0.5 mg total) by mouth 2 (two) times daily.  60 tablet  2  . nitroGLYCERIN (NITROSTAT) 0.4 MG SL tablet Place 1 tablet (0.4 mg total) under the tongue every 5 (five) minutes x 3 doses as needed for chest pain.  25 tablet  5     PHYSICAL EXAM: Filed Vitals:   04/30/12 0940  BP: 116/60  Pulse: 72  Weight: 173 lb 12.8 oz (78.835 kg)  SpO2: 100%    On hall  walk dropped sats to 79%  General:  Elderly. No resp difficulty HEENT: normal Neck: supple. JVP flat. Carotids 2+ bilaterally; no bruits. No lymphadenopathy or thryomegaly appreciated. Cor: PMI normal. Regular rate & rhythm. No rubs, gallops or murmurs. Lungs: clear with decreased BS and long exp phase. No wheeze Abdomen: soft, nontender, nondistended. No hepatosplenomegaly. No bruits or masses. Good bowel sounds. Extremities: no cyanosis, clubbing, rash, edema Neuro: alert & orientedx3, cranial nerves grossly intact. Moves all 4 extremities w/o difficulty. Affect pleasant.    ASSESSMENT & PLAN:

## 2012-04-30 NOTE — Telephone Encounter (Signed)
Per Dr. Delford Field, pls order o2 3 lpm with exertion and 2 lpm at rest - [facility].  Order placed.

## 2012-05-01 NOTE — Assessment & Plan Note (Signed)
He is improved. However we walked him in clinic and sats dropped to 79%. Stressed the need for him to wear his home O2 with ambulation. I discussed with Dr. Delford Field who will contact [facility] to arrange for portable O2. We will refer to pulmonary rehab.

## 2012-05-01 NOTE — Assessment & Plan Note (Signed)
No evidence of ischemia. Continue current regimen.   

## 2012-05-01 NOTE — Assessment & Plan Note (Signed)
Reinforced need to quit. Dr. Delford Field recently started Chantix.

## 2012-05-01 NOTE — Assessment & Plan Note (Signed)
Much improved. Now euvolemic. Reinforced need for dietary compliance, daily weights and reviewed use of sliding scale diuretics. Will refer to diet class at wife's request.

## 2012-05-03 ENCOUNTER — Encounter (HOSPITAL_COMMUNITY): Payer: Medicare PPO

## 2012-05-03 ENCOUNTER — Ambulatory Visit (HOSPITAL_COMMUNITY): Payer: Medicare PPO

## 2012-05-05 ENCOUNTER — Ambulatory Visit (HOSPITAL_COMMUNITY): Payer: Medicare PPO

## 2012-05-07 ENCOUNTER — Encounter (HOSPITAL_COMMUNITY): Payer: Medicare PPO

## 2012-05-07 ENCOUNTER — Ambulatory Visit (HOSPITAL_COMMUNITY): Payer: Medicare PPO

## 2012-05-10 ENCOUNTER — Ambulatory Visit (HOSPITAL_COMMUNITY): Payer: Medicare PPO

## 2012-05-10 ENCOUNTER — Encounter (HOSPITAL_COMMUNITY): Payer: Medicare PPO

## 2012-05-13 ENCOUNTER — Ambulatory Visit (HOSPITAL_COMMUNITY): Payer: Medicare PPO

## 2012-05-14 ENCOUNTER — Ambulatory Visit (HOSPITAL_COMMUNITY): Payer: Medicare PPO

## 2012-05-14 ENCOUNTER — Encounter (HOSPITAL_COMMUNITY): Payer: Medicare PPO

## 2012-05-17 ENCOUNTER — Ambulatory Visit (HOSPITAL_COMMUNITY): Payer: Medicare PPO

## 2012-05-17 ENCOUNTER — Encounter (HOSPITAL_COMMUNITY): Payer: Medicare PPO

## 2012-05-17 ENCOUNTER — Inpatient Hospital Stay (HOSPITAL_COMMUNITY): Admission: RE | Admit: 2012-05-17 | Payer: Medicare PPO | Source: Ambulatory Visit

## 2012-05-19 ENCOUNTER — Ambulatory Visit (HOSPITAL_COMMUNITY): Payer: Medicare PPO

## 2012-05-19 ENCOUNTER — Encounter (HOSPITAL_COMMUNITY): Payer: Medicare PPO

## 2012-05-21 ENCOUNTER — Ambulatory Visit (HOSPITAL_COMMUNITY): Payer: Medicare PPO

## 2012-05-21 ENCOUNTER — Encounter (HOSPITAL_COMMUNITY): Payer: Medicare PPO

## 2012-05-23 ENCOUNTER — Other Ambulatory Visit (HOSPITAL_COMMUNITY): Payer: Self-pay | Admitting: Physician Assistant

## 2012-05-24 ENCOUNTER — Ambulatory Visit (HOSPITAL_COMMUNITY): Payer: Medicare PPO

## 2012-05-24 ENCOUNTER — Ambulatory Visit (HOSPITAL_COMMUNITY)
Admission: RE | Admit: 2012-05-24 | Discharge: 2012-05-24 | Disposition: A | Discharge status: Home / Self Care | Payer: Medicare PPO | Source: Ambulatory Visit | Attending: Internal Medicine | Admitting: Internal Medicine

## 2012-05-24 ENCOUNTER — Encounter (HOSPITAL_COMMUNITY): Payer: Medicare PPO

## 2012-05-24 VITALS — BP 122/66 | HR 101 | Wt 177.8 lb

## 2012-05-24 DIAGNOSIS — I509 Heart failure, unspecified: Secondary | ICD-10-CM

## 2012-05-24 DIAGNOSIS — I5032 Chronic diastolic (congestive) heart failure: Secondary | ICD-10-CM | POA: Insufficient documentation

## 2012-05-24 DIAGNOSIS — I2510 Atherosclerotic heart disease of native coronary artery without angina pectoris: Secondary | ICD-10-CM | POA: Insufficient documentation

## 2012-05-24 DIAGNOSIS — N183 Chronic kidney disease, stage 3 unspecified: Secondary | ICD-10-CM | POA: Insufficient documentation

## 2012-05-24 NOTE — Progress Notes (Signed)
Weight Range   Baseline proBNP    Primary Cardiologist: Garnette Scheuermann, MD PCP: Kirby Funk, MD  HPI:  Mr. Boening is a 72 y.o. gentlemen with a cardiac history that includes CAD s/p CABG x4 (LIMA-LAD, SVG-Diag, SVG-OM1 and SVG-PDA) in June 2013, AFL s/p ablation,  diastolic heart failure, hypertension. He also has history of severe COPD previously on home O2, chronic renal failure (baseline Cr 1.5-1.7), stroke and GERD.   Had lost a lot of weight since CABG and had FTT.  He was admitted in November 2013 for NSTEMI, acute respiratory failure and altered mental status. He underwent emergent catheterization showed patent SVG-RCA and LIMA-LAD but total occlusion of SVG-OM as well as SVG-diag. Medical therapy was recommended. He was set up for home health as well as home O2. He was discharged on 11/28 and returned with progressive dyspnea/respiratory failure. He would not allow his wife to give him medications 3 days prior to admit on 04/17/12. In the ER his sats did not improve on Bipap and he required intubation. Felt to have PNA +/- CHF.  Diuresed about 11 pounds.Weight at d/c was 166. Cr on d/c was 2.1  Echo 11/13; EF 50-55% with mild-mod MR  He returns for routine follow up today with his wife.  He feels well except for he is sleepy.  He says he can do pretty much anything he wants to do.  He is not weighing daily.  He is eating multiple cans of soup in one day as he says this is what his taste buds like.  His wife is trying to provide low sodium options but he does not comply.  He continues to smoke 3-4 cigs/day (wife says more).  He is currently not going to cardiac rehab because he says Dr. Katrinka Blazing told him to hold off.  He will see Dr. Katrinka Blazing next week to reassess.  He also said he was told he no longer needs O2 but he still has tank at his house.  He denies orthopnea/PND or chest pain.      ROS: All systems negative except as listed in HPI, PMH and Problem List.  Past Medical History    Diagnosis Date  . Aorto-iliac disease     bilat with stent 2007  . CAD (coronary artery disease)     with loop to the circumflex 2000, inferolateral infarction  . Hypertension   . Paroxysmal atrial flutter     ablation  . ED (erectile dysfunction)   . COPD (chronic obstructive pulmonary disease)   . Hyperlipidemia   . Stroke   . Shortness of breath   . GERD (gastroesophageal reflux disease)   . Gout     Current Outpatient Prescriptions  Medication Sig Dispense Refill  . aspirin 81 MG chewable tablet Chew 81 mg by mouth daily.      . colchicine (COLCRYS) 0.6 MG tablet Take 0.6 mg by mouth daily.      . furosemide (LASIX) 80 MG tablet Take 80 mg by mouth daily.      . Lactobacillus-Inulin (CULTURELLE DIGESTIVE HEALTH) CAPS Take 1 capsule by mouth daily.      . Melatonin 3 MG TABS Take 2 tablets by mouth at bedtime.      . nitroGLYCERIN (NITROSTAT) 0.4 MG SL tablet Place 1 tablet (0.4 mg total) under the tongue every 5 (five) minutes x 3 doses as needed for chest pain.  25 tablet  5  . omeprazole (PRILOSEC) 20 MG capsule Take 1 capsule (20  mg total) by mouth daily. 1/2 before food      . spironolactone (ALDACTONE) 12.5 mg TABS Take 25 mg by mouth daily.      Marland Kitchen tiotropium (SPIRIVA) 18 MCG inhalation capsule Place 1 capsule (18 mcg total) into inhaler and inhale daily.  30 capsule  6  . acetaminophen (TYLENOL) 325 MG tablet Take 650 mg by mouth every 6 (six) hours as needed. For pain         PHYSICAL EXAM: Filed Vitals:   05/24/12 1406  BP: 122/66  Pulse: 101  Weight: 177 lb 12 oz (80.627 kg)  SpO2: 98%    General:  Elderly. No resp difficulty HEENT: normal Neck: supple. JVP 8-9. Carotids 2+ bilaterally; no bruits. No lymphadenopathy or thryomegaly appreciated. Cor: PMI normal. Regular rate & rhythm. No rubs, gallops or murmurs. Lungs: clear with decreased BS and long exp phase. No wheeze Abdomen: soft, nontender, nondistended. No hepatosplenomegaly. No bruits or masses.  Good bowel sounds. Extremities: no cyanosis, clubbing, rash, tr-1+ edema Neuro: alert & orientedx3, cranial nerves grossly intact. Moves all 4 extremities w/o difficulty. Affect pleasant.    ASSESSMENT & PLAN:

## 2012-05-24 NOTE — Patient Instructions (Addendum)
Increase Lasix 80 mg twice daily for 3 days.  Follow up Dr. Gala Romney in 3-4 weeks.

## 2012-05-25 NOTE — Assessment & Plan Note (Signed)
No ischemic symptoms, continue ASA.  Dr. Katrinka Blazing has taken him off statin, he will follow up next week.

## 2012-05-25 NOTE — Assessment & Plan Note (Signed)
Volume status elevated in setting of dietary noncompliance.  Have re-educated on low sodium diet and cutting back on soup.  Unsure if he will follow these instructions as he states soup is all he wants right now.  His wife is planning to attend the HF Diet class next week.  Will increase lasix 80 mg BID for 3 days.  Have also encouraged him to start weighing daily again.  If he is not losing weight with increased lasix he will call the clinic.

## 2012-05-25 NOTE — Assessment & Plan Note (Signed)
Wants to defer labs until follow up with Dr. Katrinka Blazing.  As he has not changed lasix dose or other medications I feel this is ok.

## 2012-05-26 ENCOUNTER — Ambulatory Visit (HOSPITAL_COMMUNITY): Payer: Medicare PPO

## 2012-05-26 ENCOUNTER — Encounter (HOSPITAL_COMMUNITY): Payer: Medicare PPO

## 2012-05-28 ENCOUNTER — Encounter: Payer: Self-pay | Admitting: Surgery

## 2012-05-28 ENCOUNTER — Ambulatory Visit (HOSPITAL_COMMUNITY): Payer: Medicare PPO

## 2012-05-28 ENCOUNTER — Encounter (HOSPITAL_COMMUNITY): Payer: Medicare PPO

## 2012-05-31 ENCOUNTER — Ambulatory Visit (INDEPENDENT_AMBULATORY_CARE_PROVIDER_SITE_OTHER): Payer: Medicare PPO | Admitting: Surgery

## 2012-05-31 ENCOUNTER — Encounter (HOSPITAL_COMMUNITY): Payer: Medicare PPO

## 2012-05-31 ENCOUNTER — Ambulatory Visit: Payer: Medicare PPO | Admitting: Surgery

## 2012-05-31 ENCOUNTER — Encounter: Payer: Self-pay | Admitting: Surgery

## 2012-05-31 ENCOUNTER — Other Ambulatory Visit: Payer: Medicare PPO

## 2012-05-31 ENCOUNTER — Other Ambulatory Visit (INDEPENDENT_AMBULATORY_CARE_PROVIDER_SITE_OTHER): Payer: Medicare PPO | Admitting: *Deleted

## 2012-05-31 ENCOUNTER — Ambulatory Visit (HOSPITAL_COMMUNITY): Payer: Medicare PPO

## 2012-05-31 VITALS — BP 124/66 | HR 90 | Ht 70.0 in | Wt 176.2 lb

## 2012-05-31 DIAGNOSIS — I6529 Occlusion and stenosis of unspecified carotid artery: Secondary | ICD-10-CM

## 2012-05-31 DIAGNOSIS — Z48812 Encounter for surgical aftercare following surgery on the circulatory system: Secondary | ICD-10-CM

## 2012-05-31 NOTE — Progress Notes (Signed)
[facility] [place]   Patient name: Nathan [name] MRN: [id number] DOB: 11-Jul-1940 Sex: male     Chief Complaint  Patient presents with  . Re-evaluation    5 month f/u w/ repeat carotid, s/p R CEA 10/31/2011    HISTORY OF PRESENT ILLNESS: The patient is back today for followup. He is status post right carotid endarterectomy, performed in conjunction with coronary artery bypass grafting on 10/31/2011. His postoperative course was complicated by a pulmonary issues requiring prolonged intubation. He also had issues with aspiration and hoarseness. He states that he no longer has any difficulty swallowing. He is gaining weight and eating without reservation. He denies having any neurologic symptoms. His only complaint is that of some numbness around his incision.  Past Medical History  Diagnosis Date  . Aorto-iliac disease     bilat with stent 2007  . CAD (coronary artery disease)     with loop to the circumflex 2000, inferolateral infarction  . Hypertension   . Paroxysmal atrial flutter     ablation  . ED (erectile dysfunction)   . COPD (chronic obstructive pulmonary disease)   . Hyperlipidemia   . Stroke   . Shortness of breath   . GERD (gastroesophageal reflux disease)   . Gout     Past Surgical History  Procedure Date  . Spinal fusion   . Hand surgery   . Neck fusion 1975  . Eye surgery   . Coronary artery bypass graft 10/31/2011    Procedure: CORONARY ARTERY BYPASS GRAFTING (CABG);  Surgeon: Delight Ovens, MD;  Location: [facility] OR;  Service: Open Heart Surgery;  Laterality: N/A;  times three using  Left Internal Mammary Artery and Right Greater Saphenous Vein Graft harvested endoscopically; TEE  . Endarterectomy 10/31/2011    Procedure: ENDARTERECTOMY CAROTID;  Surgeon: Nada Libman, MD;  Location: [facility] OR;  Service: Vascular;  Laterality: Right;  with patch angioplasty   . Carotid endarterectomy 10-31-2011    right    History   Social History    . Marital Status: Married    Spouse Name: N/A    Number of Children: N/A  . Years of Education: N/A   Occupational History  . retired    Social History Main Topics  . Smoking status: Current Some Day Smoker -- 0.3 packs/day for 64 years    Types: Cigarettes  . Smokeless tobacco: Never Used     Comment: pt states that he is down to 4 cigs per day  . 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 Drug Use: No  . Sexually Active: Not on file   Other Topics Concern  . Not on file   Social History Narrative  . No narrative on file    Family History  Problem Relation Age of Onset  . Cancer Mother     Allergies as of 05/31/2012  . (No Known Allergies)    Current Outpatient Prescriptions on File Prior to Visit  Medication Sig Dispense Refill  . acetaminophen (TYLENOL) 325 MG tablet Take 650 mg by mouth every 6 (six) hours as needed. For pain      . aspirin 81 MG chewable tablet Chew 81 mg by mouth daily.      . colchicine (COLCRYS) 0.6 MG tablet Take 0.6 mg by mouth daily.      Marland Kitchen  furosemide (LASIX) 80 MG tablet Take 80 mg by mouth daily.      . Lactobacillus-Inulin (CULTURELLE DIGESTIVE HEALTH) CAPS Take 1 capsule by mouth daily.      . Melatonin 3 MG TABS Take 2 tablets by mouth at bedtime.      . nitroGLYCERIN (NITROSTAT) 0.4 MG SL tablet Place 1 tablet (0.4 mg total) under the tongue every 5 (five) minutes x 3 doses as needed for chest pain.  25 tablet  5  . omeprazole (PRILOSEC) 20 MG capsule Take 1 capsule (20 mg total) by mouth daily. 1/2 before food      . spironolactone (ALDACTONE) 12.5 mg TABS Take 25 mg by mouth daily.      Marland Kitchen tiotropium (SPIRIVA) 18 MCG inhalation capsule Place 1 capsule (18 mcg total) into inhaler and inhale daily.  30 capsule  6     REVIEW OF SYSTEMS: See history of present illness, otherwise all systems negative  PHYSICAL  EXAMINATION:   Vital signs are BP 124/66  Pulse 90  Ht 5\' 10"  (1.778 m)  Wt 176 lb 3.2 oz (79.924 kg)  BMI 25.28 kg/m2  SpO2 100% General: The patient appears their stated age. HEENT:  No gross abnormalities Pulmonary:  Non labored breathing Musculoskeletal: There are no major deformities. Neurologic: No focal weakness or paresthesias are detected, tongue is midline Skin: There are no ulcer or rashes noted. Psychiatric: The patient has normal affect. Cardiovascular: There is a regular rate and rhythm without significant murmur appreciated. No carotid bruits   Diagnostic Studies Carotid duplex was ordered and reviewed. This shows a widely patent right carotid endarterectomy site., 1-39% left carotid stenosis  Assessment: Status post right carotid endarterectomy for asymptomatic stenosis Plan: The patient has nearly completely recovered from his operations. His only complaint is that of numbness around his incision. I have told him that this may persist. Ultrasound today shows a widely patent endarterectomy site with no disease on the contralateral side. From my perspective, the patient is doing very well. He will follow up in one year with a repeat carotid ultrasound  V. Charlena Cross, M.D. [facility] [place] Office: [phone] Pager:  [phone]

## 2012-06-02 ENCOUNTER — Ambulatory Visit (HOSPITAL_COMMUNITY): Payer: Medicare PPO

## 2012-06-02 ENCOUNTER — Encounter (HOSPITAL_COMMUNITY): Payer: Medicare PPO

## 2012-06-03 ENCOUNTER — Inpatient Hospital Stay (HOSPITAL_COMMUNITY): Admission: RE | Admit: 2012-06-03 | Payer: Medicare PPO | Source: Ambulatory Visit

## 2012-06-04 ENCOUNTER — Ambulatory Visit (HOSPITAL_COMMUNITY): Payer: Medicare PPO

## 2012-06-04 ENCOUNTER — Encounter (HOSPITAL_COMMUNITY): Payer: Medicare PPO

## 2012-06-07 ENCOUNTER — Encounter (HOSPITAL_COMMUNITY): Payer: Medicare PPO

## 2012-06-07 ENCOUNTER — Ambulatory Visit (HOSPITAL_COMMUNITY): Payer: Medicare PPO

## 2012-06-07 ENCOUNTER — Other Ambulatory Visit: Payer: Self-pay | Admitting: *Deleted

## 2012-06-07 DIAGNOSIS — I6529 Occlusion and stenosis of unspecified carotid artery: Secondary | ICD-10-CM

## 2012-06-09 ENCOUNTER — Ambulatory Visit (HOSPITAL_COMMUNITY): Payer: Medicare PPO

## 2012-06-09 ENCOUNTER — Encounter (HOSPITAL_COMMUNITY): Payer: Medicare PPO

## 2012-06-11 ENCOUNTER — Encounter (HOSPITAL_COMMUNITY): Payer: Medicare PPO

## 2012-06-11 ENCOUNTER — Ambulatory Visit (HOSPITAL_COMMUNITY): Payer: Medicare PPO

## 2012-06-14 ENCOUNTER — Ambulatory Visit (HOSPITAL_COMMUNITY)
Admission: RE | Admit: 2012-06-14 | Discharge: 2012-06-14 | Disposition: A | Discharge status: Home / Self Care | Payer: Medicare PPO | Source: Ambulatory Visit | Attending: Internal Medicine | Admitting: Internal Medicine

## 2012-06-14 ENCOUNTER — Ambulatory Visit (HOSPITAL_COMMUNITY): Payer: Medicare PPO

## 2012-06-14 ENCOUNTER — Encounter (HOSPITAL_COMMUNITY): Payer: Self-pay

## 2012-06-14 VITALS — BP 136/70 | HR 91 | Wt 179.4 lb

## 2012-06-14 DIAGNOSIS — F17200 Nicotine dependence, unspecified, uncomplicated: Secondary | ICD-10-CM | POA: Insufficient documentation

## 2012-06-14 DIAGNOSIS — I509 Heart failure, unspecified: Secondary | ICD-10-CM | POA: Insufficient documentation

## 2012-06-14 DIAGNOSIS — I5032 Chronic diastolic (congestive) heart failure: Secondary | ICD-10-CM | POA: Insufficient documentation

## 2012-06-14 NOTE — Progress Notes (Signed)
Patient ID: Nathan [name], male   DOB: 1940-08-03, 72 y.o.   MRN: [id number]   Weight Range 166 pounds  Baseline proBNP    Primary Cardiologist: Garnette Scheuermann, MD PCP: Kirby Funk, MD  HPI:  Nathan [name] is a 72 y.o. gentlemen with a cardiac history that includes CAD s/p CABG x4 (LIMA-LAD, SVG-Diag, SVG-OM1 and SVG-PDA) in June 2013, AFL s/p ablation,  diastolic heart failure, hypertension. He also has history of severe COPD previously on home O2, chronic renal failure (baseline Cr 1.5-1.7), stroke and GERD.   He was admitted in November 2013 for NSTEMI, acute respiratory failure and altered mental status. He underwent emergent catheterization showed patent SVG-RCA and LIMA-LAD but total occlusion of SVG-OM as well as SVG-diag. Medical therapy was recommended. He was set up for home health as well as home O2. He was discharged on 11/28 and returned with progressive dyspnea/respiratory failure. He would not allow his Nathan [name] to give him medications 3 days prior to admit on 04/17/12. In the ER his sats did not improve on Bipap and he required intubation. Felt to have PNA +/- CHF.  Diuresed about 11 pounds.Weight at d/c was 166. Cr on d/c was 2.1  Echo 11/13; EF 50-55% with mild-mod MR  He returns for routine follow up today with his Nathan [name]. Last visit lasix increased to 80 mg twice a day for 2 days. Complains of fatigue. Denies SOB/PND/CP. + Orthopnea -sleeps on 2 pillows. Smokes 1/2 pack per day (tried chantix x 2). He does not want to stop smoking. Does not weigh at home. Compliant with medications. Has oxygen at home but not using it.     ROS: All systems negative except as listed in HPI, PMH and Problem List.  Past Medical History  Diagnosis Date  . Aorto-iliac disease     bilat with stent 2007  . CAD (coronary artery disease)     with loop to the circumflex 2000, inferolateral infarction  . Hypertension   . Paroxysmal atrial flutter     ablation  . ED (erectile dysfunction)   . COPD  (chronic obstructive pulmonary disease)   . Hyperlipidemia   . Stroke   . Shortness of breath   . GERD (gastroesophageal reflux disease)   . Gout     Current Outpatient Prescriptions  Medication Sig Dispense Refill  . acetaminophen (TYLENOL) 325 MG tablet Take 650 mg by mouth every 6 (six) hours as needed. For pain      . aspirin 81 MG chewable tablet Chew 81 mg by mouth daily.      . colchicine (COLCRYS) 0.6 MG tablet Take 0.6 mg by mouth daily.      . furosemide (LASIX) 80 MG tablet Take 80 mg by mouth daily.      . Lactobacillus-Inulin (CULTURELLE DIGESTIVE HEALTH) CAPS Take 1 capsule by mouth daily.      . Melatonin 3 MG TABS Take 2 tablets by mouth at bedtime.      Marland Kitchen spironolactone (ALDACTONE) 12.5 mg TABS Take 25 mg by mouth daily.      Marland Kitchen tiotropium (SPIRIVA) 18 MCG inhalation capsule Place 1 capsule (18 mcg total) into inhaler and inhale daily.  30 capsule  6  . nitroGLYCERIN (NITROSTAT) 0.4 MG SL tablet Place 1 tablet (0.4 mg total) under the tongue every 5 (five) minutes x 3 doses as needed for chest pain.  25 tablet  5  . omeprazole (PRILOSEC) 20 MG capsule Take 1 capsule (20 mg total) by mouth daily.  1/2 before food         PHYSICAL EXAM: Filed Vitals:   06/14/12 1538  BP: 136/70  Pulse: 91  Weight: 179 lb 6.4 oz (81.375 kg)  SpO2: 97%   179 (177 pounds)  General:  Elderly. No resp difficulty HEENT: normal Neck: supple. JVP 5-6. Carotids 2+ bilaterally; no bruits. No lymphadenopathy or thryomegaly appreciated. Cor: PMI normal. Regular rate & rhythm. No rubs, gallops or murmurs. Lungs: clear with decreased BS and long exp phase. No wheeze Abdomen: soft, nontender, nondistended. No hepatosplenomegaly. No bruits or masses. Good bowel sounds. Extremities: no cyanosis, clubbing, rash, no edema Neuro: alert & orientedx3, cranial nerves grossly intact. Moves all 4 extremities w/o difficulty. Affect pleasant.    ASSESSMENT & PLAN:

## 2012-06-14 NOTE — Assessment & Plan Note (Addendum)
Volume status stable. Continue current diuretic regimen. He will follow up with Dr Katrinka Blazing for ongoing HF management. Follow up at HF clinic as needed.   Patient seen and examined with Tonye Becket, NP. We discussed all aspects of the encounter. I agree with the assessment and plan as stated above. HF stable. Reinforced need for daily weights and reviewed use of sliding scale diuretics. He will f/u in the [facility] HF clinic. We are happy to see him back as needed.

## 2012-06-14 NOTE — Patient Instructions (Addendum)
Follow up in 3 months   Do the following things EVERYDAY: 1) Weigh yourself in the morning before breakfast. Write it down and keep it in a log. 2) Take your medicines as prescribed 3) Eat low salt foods-Limit salt (sodium) to 2000 mg per day.  4) Stay as active as you can everyday 5) Limit all fluids for the day to less than 2 liters  

## 2012-06-14 NOTE — Assessment & Plan Note (Addendum)
Offered smoking cessation options however he declines.   Patient seen and examined with Tonye Becket, NP. We discussed all aspects of the encounter. I agree with the assessment and plan as stated above.  He is not interested in quitting. Has f/u with Dr. Delford Field in Pulmonary.

## 2012-06-16 ENCOUNTER — Ambulatory Visit (HOSPITAL_COMMUNITY): Payer: Medicare PPO

## 2012-06-18 ENCOUNTER — Ambulatory Visit (HOSPITAL_COMMUNITY): Payer: Medicare PPO

## 2012-06-21 ENCOUNTER — Ambulatory Visit (HOSPITAL_COMMUNITY): Payer: Medicare PPO

## 2012-06-23 ENCOUNTER — Ambulatory Visit (HOSPITAL_COMMUNITY): Payer: Medicare PPO

## 2012-06-25 ENCOUNTER — Ambulatory Visit (HOSPITAL_COMMUNITY): Payer: Medicare PPO

## 2012-06-28 ENCOUNTER — Ambulatory Visit (HOSPITAL_COMMUNITY): Payer: Medicare PPO

## 2012-06-29 ENCOUNTER — Ambulatory Visit (INDEPENDENT_AMBULATORY_CARE_PROVIDER_SITE_OTHER): Payer: Medicare PPO | Admitting: Critical Care Medicine

## 2012-06-29 ENCOUNTER — Encounter: Payer: Self-pay | Admitting: Critical Care Medicine

## 2012-06-29 VITALS — BP 112/68 | HR 100 | Temp 97.7°F | Ht 72.25 in | Wt 184.6 lb

## 2012-06-29 DIAGNOSIS — J4489 Other specified chronic obstructive pulmonary disease: Secondary | ICD-10-CM

## 2012-06-29 DIAGNOSIS — J449 Chronic obstructive pulmonary disease, unspecified: Secondary | ICD-10-CM

## 2012-06-29 NOTE — Patient Instructions (Addendum)
No change in medications. Return in        6 months        

## 2012-06-29 NOTE — Progress Notes (Signed)
Subjective:    Patient ID: Nathan [name], male    DOB: 1940-11-28, 72 y.o.   MRN: [id number]  HPI  72 y.o.WM This patient was hospitalized between the seventh and 12th of December. The patient had acute pulmonary edema, community acquired pneumonia, acute on chronic diastolic heart failure, acute on chronic renal failure, COPD exacerbation, and paroxysmal atrial flutter. The patient was on mechanical ventilation for 2 days. The patient was discharged home on a Spiriva inhaler. Patient had medications adjusted for pulmonary edema and atrial fibrillation and flutter. Patient was maintained on Lasix on discharge. Discharge creatinine was 2.16. Baseline is 1.5. Patient had mild anemia upon discharge. The patient is scheduled to return to the heart failure clinic.  Currently the patient is improved. There is no cough. There is no dyspnea. There is no chest pain. Patient is still smoking a pack a day of cigarettes. Her weight has been stable between 170 and 169 pounds. There is no peripheral edema. There is no excess mucus. Pt denies any significant sore throat, nasal congestion or excess secretions, fever, chills, sweats, unintended weight loss, pleurtic or exertional chest pain, orthopnea PND, or leg swelling Pt denies any increase in rescue therapy over baseline, denies waking up needing it or having any early am or nocturnal exacerbations of coughing/wheezing/or dyspnea. Pt also denies any obvious fluctuation in symptoms with  weather or environmental change or other alleviating or aggravating factors  06/29/2012 Pt still smoking 1/2 PPD.  No mucus now.  On spiriva now.  No chest pain.  No wheezing. No cough.  Needs a dental procedure.   Pt denies any significant sore throat, nasal congestion or excess secretions, fever, chills, sweats, unintended weight loss, pleurtic or exertional chest pain, orthopnea PND, or leg swelling Pt denies any increase in rescue therapy over baseline, denies waking up  needing it or having any early am or nocturnal exacerbations of coughing/wheezing/or dyspnea. Pt also denies any obvious fluctuation in symptoms with  weather or environmental change or other alleviating or aggravating factors    Past Medical History  Diagnosis Date  . Aorto-iliac disease     bilat with stent 2007  . CAD (coronary artery disease)     with loop to the circumflex 2000, inferolateral infarction  . Hypertension   . Paroxysmal atrial flutter     ablation  . ED (erectile dysfunction)   . COPD (chronic obstructive pulmonary disease)   . Hyperlipidemia   . Stroke   . Shortness of breath   . GERD (gastroesophageal reflux disease)   . Gout      Family History  Problem Relation Age of Onset  . Cancer Mother      History   Social History  . Marital Status: Married    Spouse Name: N/A    Number of Children: N/A  . Years of Education: N/A   Occupational History  . retired    Social History Main Topics  . Smoking status: Current Some Day Smoker -- 0.50 packs/day for 64 years    Types: Cigarettes  . Smokeless tobacco: Never Used  . 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 Drug Use: No  . Sexually Active: Not on file   Other Topics Concern  . Not on file   Social History Narrative  . No narrative on file  No Known Allergies   Outpatient Prescriptions Prior to Visit  Medication Sig Dispense Refill  . acetaminophen (TYLENOL) 325 MG tablet Take 650 mg by mouth every 6 (six) hours as needed. For pain      . aspirin 81 MG chewable tablet Chew 81 mg by mouth daily.      . colchicine (COLCRYS) 0.6 MG tablet Take 0.6 mg by mouth daily.      . furosemide (LASIX) 80 MG tablet Take 80 mg by mouth daily.      . Lactobacillus-Inulin (CULTURELLE DIGESTIVE HEALTH) CAPS Take 1 capsule by mouth daily.      . Melatonin 3 MG TABS Take 2 tablets by mouth at  bedtime.      . nitroGLYCERIN (NITROSTAT) 0.4 MG SL tablet Place 1 tablet (0.4 mg total) under the tongue every 5 (five) minutes x 3 doses as needed for chest pain.  25 tablet  5  . spironolactone (ALDACTONE) 12.5 mg TABS Take 25 mg by mouth daily.      Marland Kitchen tiotropium (SPIRIVA) 18 MCG inhalation capsule Place 1 capsule (18 mcg total) into inhaler and inhale daily.  30 capsule  6  . omeprazole (PRILOSEC) 20 MG capsule Take 1 capsule (20 mg total) by mouth daily. 1/2 before food       No facility-administered medications prior to visit.     Review of Systems  Constitutional:   No  weight loss, night sweats,  Fevers, chills, fatigue, lassitude. HEENT:   No headaches,  Difficulty swallowing,  Tooth/dental problems,  Sore throat,                No sneezing, itching, ear ache, nasal congestion, post nasal drip,   CV:  No chest pain,  Orthopnea, PND, swelling in lower extremities, anasarca, dizziness, palpitations  GI  No heartburn, indigestion, abdominal pain, nausea, vomiting, diarrhea, change in bowel habits, loss of appetite  Resp: Notes  shortness of breath with exertion not at rest.  No excess mucus, no productive cough,  No non-productive cough,  No coughing up of blood.  No change in color of mucus.  No wheezing.  No chest wall deformity  Skin: no rash or lesions.  GU: no dysuria, change in color of urine, no urgency or frequency.  No flank pain.  MS:  No joint pain or swelling.  No decreased range of motion.  No back pain.  Psych:  No change in mood or affect. No depression or anxiety.  No memory loss.     Objective:   Physical Exam  Filed Vitals:   06/29/12 1522  BP: 112/68  Pulse: 100  Temp: 97.7 F (36.5 C)  TempSrc: Oral  Height: 6' 0.25" (1.835 m)  Weight: 184 lb 9.6 oz (83.734 kg)  SpO2: 98%    Gen: Pleasant, well-nourished, in no distress,  normal affect  ENT: No lesions,  mouth clear,  oropharynx clear, no postnasal drip  Neck: No JVD, no TMG, no carotid  bruits  Lungs: No use of accessory muscles, no dullness to percussion, distant breath sounds  Cardiovascular: RRR, heart sounds normal, no murmur or gallops, no peripheral edema  Abdomen: soft and NT, no HSM,  BS normal  Musculoskeletal: No deformities, no cyanosis or clubbing  Neuro: alert, non focal  Skin: Warm, no lesions or rashes    Assessment & Plan:   COPD (chronic obstructive pulmonary disease), gold stage C. Gold stage C. COPD with ongoing smoking use however stable at this time I been  unsuccessful in doing this patient has successfully quit smoking Plan Maintain Spiriva daily Further attempts at smoking cessation have been not successful    Updated Medication List Outpatient Encounter Prescriptions as of 06/29/2012  Medication Sig Dispense Refill  . acetaminophen (TYLENOL) 325 MG tablet Take 650 mg by mouth every 6 (six) hours as needed. For pain      . aspirin 81 MG chewable tablet Chew 81 mg by mouth daily.      . colchicine (COLCRYS) 0.6 MG tablet Take 0.6 mg by mouth daily.      . furosemide (LASIX) 80 MG tablet Take 80 mg by mouth daily.      . Lactobacillus-Inulin (CULTURELLE DIGESTIVE HEALTH) CAPS Take 1 capsule by mouth daily.      . Melatonin 3 MG TABS Take 2 tablets by mouth at bedtime.      . nitroGLYCERIN (NITROSTAT) 0.4 MG SL tablet Place 1 tablet (0.4 mg total) under the tongue every 5 (five) minutes x 3 doses as needed for chest pain.  25 tablet  5  . spironolactone (ALDACTONE) 12.5 mg TABS Take 25 mg by mouth daily.      Marland Kitchen tiotropium (SPIRIVA) 18 MCG inhalation capsule Place 1 capsule (18 mcg total) into inhaler and inhale daily.  30 capsule  6  . [DISCONTINUED] omeprazole (PRILOSEC) 20 MG capsule Take 1 capsule (20 mg total) by mouth daily. 1/2 before food       No facility-administered encounter medications on file as of 06/29/2012.

## 2012-06-29 NOTE — Assessment & Plan Note (Signed)
Gold stage C. COPD with ongoing smoking use however stable at this time I been unsuccessful in doing this patient has successfully quit smoking Plan Maintain Spiriva daily Further attempts at smoking cessation have been not successful

## 2012-06-30 ENCOUNTER — Ambulatory Visit (HOSPITAL_COMMUNITY): Payer: Medicare PPO

## 2012-07-02 ENCOUNTER — Ambulatory Visit (HOSPITAL_COMMUNITY): Payer: Medicare PPO

## 2012-07-05 ENCOUNTER — Ambulatory Visit (HOSPITAL_COMMUNITY): Payer: Medicare PPO

## 2012-07-07 ENCOUNTER — Ambulatory Visit (HOSPITAL_COMMUNITY): Payer: Medicare PPO

## 2012-07-09 ENCOUNTER — Ambulatory Visit (HOSPITAL_COMMUNITY): Payer: Medicare PPO

## 2012-07-12 ENCOUNTER — Ambulatory Visit (HOSPITAL_COMMUNITY): Payer: Medicare PPO

## 2012-07-14 ENCOUNTER — Ambulatory Visit (HOSPITAL_COMMUNITY): Payer: Medicare PPO

## 2012-07-16 ENCOUNTER — Ambulatory Visit (HOSPITAL_COMMUNITY): Payer: Medicare PPO

## 2012-07-19 ENCOUNTER — Ambulatory Visit (HOSPITAL_COMMUNITY): Payer: Medicare PPO

## 2012-07-21 ENCOUNTER — Ambulatory Visit (HOSPITAL_COMMUNITY): Payer: Medicare PPO

## 2012-07-23 ENCOUNTER — Ambulatory Visit (HOSPITAL_COMMUNITY): Payer: Medicare PPO

## 2012-07-26 ENCOUNTER — Ambulatory Visit (HOSPITAL_COMMUNITY): Payer: Medicare PPO

## 2012-07-28 ENCOUNTER — Ambulatory Visit (HOSPITAL_COMMUNITY): Payer: Medicare PPO

## 2012-07-30 ENCOUNTER — Ambulatory Visit (HOSPITAL_COMMUNITY): Payer: Medicare PPO

## 2012-08-02 ENCOUNTER — Ambulatory Visit (HOSPITAL_COMMUNITY): Payer: Medicare PPO

## 2012-08-04 ENCOUNTER — Ambulatory Visit (HOSPITAL_COMMUNITY): Payer: Medicare PPO

## 2012-08-06 ENCOUNTER — Ambulatory Visit (HOSPITAL_COMMUNITY): Payer: Medicare PPO

## 2012-08-09 ENCOUNTER — Ambulatory Visit (HOSPITAL_COMMUNITY): Payer: Medicare PPO

## 2012-08-11 ENCOUNTER — Ambulatory Visit (HOSPITAL_COMMUNITY): Payer: Medicare PPO

## 2012-08-13 ENCOUNTER — Ambulatory Visit (HOSPITAL_COMMUNITY): Payer: Medicare PPO

## 2012-08-16 ENCOUNTER — Ambulatory Visit (HOSPITAL_COMMUNITY): Payer: Medicare PPO

## 2012-08-18 ENCOUNTER — Ambulatory Visit (HOSPITAL_COMMUNITY): Payer: Medicare PPO

## 2012-08-20 ENCOUNTER — Ambulatory Visit (HOSPITAL_COMMUNITY): Payer: Medicare PPO

## 2012-08-23 ENCOUNTER — Ambulatory Visit (HOSPITAL_COMMUNITY): Payer: Medicare PPO

## 2012-08-25 ENCOUNTER — Ambulatory Visit (HOSPITAL_COMMUNITY): Payer: Medicare PPO

## 2012-08-27 ENCOUNTER — Ambulatory Visit (HOSPITAL_COMMUNITY): Payer: Medicare PPO

## 2012-08-30 ENCOUNTER — Ambulatory Visit (HOSPITAL_COMMUNITY): Payer: Medicare PPO

## 2012-09-01 ENCOUNTER — Ambulatory Visit (HOSPITAL_COMMUNITY): Payer: Medicare PPO

## 2012-09-03 ENCOUNTER — Ambulatory Visit (HOSPITAL_COMMUNITY): Payer: Medicare PPO

## 2012-09-06 ENCOUNTER — Ambulatory Visit (HOSPITAL_COMMUNITY): Payer: Medicare PPO

## 2012-09-08 ENCOUNTER — Ambulatory Visit (HOSPITAL_COMMUNITY): Payer: Medicare PPO

## 2012-09-10 ENCOUNTER — Inpatient Hospital Stay (HOSPITAL_COMMUNITY): Admission: RE | Admit: 2012-09-10 | Payer: Medicare PPO | Source: Ambulatory Visit

## 2012-09-22 ENCOUNTER — Telehealth: Payer: Self-pay | Admitting: Critical Care Medicine

## 2012-09-22 DIAGNOSIS — J449 Chronic obstructive pulmonary disease, unspecified: Secondary | ICD-10-CM

## 2012-09-22 NOTE — Telephone Encounter (Signed)
Last ov 2.18.14 w/ PW O2 ordered 12.20.13 No changes per last last pt instructions   Called spoke with patient who stated that he has not used his O2 "for the past few months" and is requesting an order to have this picked up Pt denies any symptoms of dyspnea, wheezing, chest tightness Per last ov, pt is to follow up in 6 months (12/2012) - pt declined sooner follow up  Dr Delford Field please advise if okay to place order to d/c O2, thanks. Pt okay with call back tomorrow

## 2012-09-22 NOTE — Telephone Encounter (Signed)
Called spoke with patient, advised of PW's ok to d/c the O2 Order placed to [facility], pt aware it will likely be tomorrow before they contact him Order placed to d/c O2 Will sign off

## 2012-09-22 NOTE — Telephone Encounter (Signed)
I am ok with d/c of oxygen order

## 2012-10-05 ENCOUNTER — Ambulatory Visit (HOSPITAL_COMMUNITY)
Admission: RE | Admit: 2012-10-05 | Discharge: 2012-10-05 | Disposition: A | Discharge status: Home / Self Care | Payer: Medicare PPO | Source: Ambulatory Visit | Attending: Internal Medicine | Admitting: Internal Medicine

## 2012-10-05 ENCOUNTER — Encounter (HOSPITAL_COMMUNITY): Payer: Self-pay

## 2012-10-05 VITALS — BP 120/68 | HR 86 | Wt 184.4 lb

## 2012-10-05 DIAGNOSIS — I5032 Chronic diastolic (congestive) heart failure: Secondary | ICD-10-CM | POA: Insufficient documentation

## 2012-10-05 DIAGNOSIS — I509 Heart failure, unspecified: Secondary | ICD-10-CM | POA: Insufficient documentation

## 2012-10-05 DIAGNOSIS — F17200 Nicotine dependence, unspecified, uncomplicated: Secondary | ICD-10-CM | POA: Insufficient documentation

## 2012-10-05 MED ORDER — EPLERENONE 25 MG PO TABS: 12.5000 mg | ORAL_TABLET | Freq: Every day | ORAL | Status: DC

## 2012-10-05 NOTE — Assessment & Plan Note (Signed)
Offered smoking cessation options however he declines.

## 2012-10-05 NOTE — Assessment & Plan Note (Signed)
Volume status stable. Continue lasix 40 mg daily. Due to breast tenderness will stop spironolactone and start INSPRA 12.5 mg daily. Reinforced daily weights, limiting fluid intake to < 2 liters, medication compliance, and low salt food choices. Follow up in 6 months.

## 2012-10-05 NOTE — Progress Notes (Signed)
Patient ID: Nathan [name], male   DOB: [birth year], 72 y.o.   MRN: [id number]   Weight Range 166 pounds  Baseline proBNP    Primary Cardiologist: Nathan Scheuermann, MD PCP: Nathan Funk, MD  HPI:  Nathan [name] is a 72 y.o. gentlemen with a cardiac history that includes CAD s/p CABG x4 (LIMA-LAD, SVG-Diag, SVG-OM1 and SVG-PDA) in June 2013, AFL s/p ablation,  diastolic heart failure, hypertension. He also has history of severe COPD previously on home O2, chronic renal failure (baseline Cr 1.5-1.7), stroke and GERD.   He was admitted in November 2013 for NSTEMI, acute respiratory failure and altered mental status. He underwent emergent catheterization showed patent SVG-RCA and LIMA-LAD but total occlusion of SVG-OM as well as SVG-diag. Medical therapy was recommended. He was discharged on 04/08/12 and returned with progressive dyspnea/respiratory failure. He would not allow his wife to give him medications 3 days prior to admit on 04/17/12. In the ER his sats did not improve on Bipap and he required intubation. Felt to have PNA +/- CHF.  Diuresed about 11 pounds.Weight at d/c was 166. Cr on d/c was 2.1  Echo 11/13; EF 50-55% with mild-mod MR  He returns for routine follow up today with his wife. Complains of breast tenderness. Last visit with Nathan [name] he cut back lasix to 40 mg daily. Weight at home 184 pounds.  Complains of fatigue. Denies SOB/PND/CP. + Orthopnea -sleeps on 2 pillows. Smokes 1 pack per day. He does not want to stop smoking. Compliant with medications. He sent oxygen back because he did not want to use it.      ROS: All systems negative except as listed in HPI, PMH and Problem List.  Past Medical History  Diagnosis Date  . Aorto-iliac disease     bilat with stent 2007  . CAD (coronary artery disease)     with loop to the circumflex 2000, inferolateral infarction  . Hypertension   . Paroxysmal atrial flutter     ablation  . ED (erectile dysfunction)   . COPD (chronic obstructive  pulmonary disease)   . Hyperlipidemia   . Stroke   . Shortness of breath   . GERD (gastroesophageal reflux disease)   . Gout     Current Outpatient Prescriptions  Medication Sig Dispense Refill  . acetaminophen (TYLENOL) 325 MG tablet Take 650 mg by mouth every 6 (six) hours as needed. For pain      . aspirin 81 MG chewable tablet Chew 81 mg by mouth daily.      . colchicine (COLCRYS) 0.6 MG tablet Take 0.6 mg by mouth daily.      . furosemide (LASIX) 80 MG tablet Take 40 mg by mouth daily.       . Lactobacillus-Inulin (CULTURELLE DIGESTIVE HEALTH) CAPS Take 1 capsule by mouth daily.      . Melatonin 3 MG TABS Take 2 tablets by mouth at bedtime.      . nitroGLYCERIN (NITROSTAT) 0.4 MG SL tablet Place 1 tablet (0.4 mg total) under the tongue every 5 (five) minutes x 3 doses as needed for chest pain.  25 tablet  5  . spironolactone (ALDACTONE) 12.5 mg TABS Take 25 mg by mouth daily.      Marland Kitchen tiotropium (SPIRIVA) 18 MCG inhalation capsule Place 1 capsule (18 mcg total) into inhaler and inhale daily.  30 capsule  6   No current facility-administered medications for this encounter.     PHYSICAL EXAM: Filed Vitals:   10/05/12 1454  BP: 120/68  Pulse: 86  Weight: 184 lb 6.4 oz (83.643 kg)  SpO2: 98%   General:  Elderly. No resp difficulty HEENT: normal Neck: supple. JVP 5-6. Carotids 2+ bilaterally; no bruits. No lymphadenopathy or thryomegaly appreciated. Cor: PMI normal. Regular rate & rhythm. No rubs, gallops or murmurs. Lungs: clear with decreased BS and long exp phase. No wheeze Abdomen: soft, nontender, nondistended. No hepatosplenomegaly. No bruits or masses. Good bowel sounds. Extremities: no cyanosis, clubbing, rash, no edema Neuro: alert & orientedx3, cranial nerves grossly intact. Moves all 4 extremities w/o difficulty. Affect pleasant.    ASSESSMENT & PLAN:

## 2012-10-05 NOTE — Patient Instructions (Addendum)
Stop Spironolactone  Start INSPRA 12.5 mg daily  Follow up in 6 months   Do the following things EVERYDAY: 1) Weigh yourself in the morning before breakfast. Write it down and keep it in a log. 2) Take your medicines as prescribed 3) Eat low salt foods-Limit salt (sodium) to 2000 mg per day.  4) Stay as active as you can everyday 5) Limit all fluids for the day to less than 2 liters

## 2012-10-16 IMAGING — CR DG CHEST 2V
2 series · 2 of 2 positions shown · non-contrast
Comparison: Chest x-ray of 12/03/2011

CLINICAL DATA: Status post CABG, follow-up

CHEST - 2 VIEW

[w chest pa]
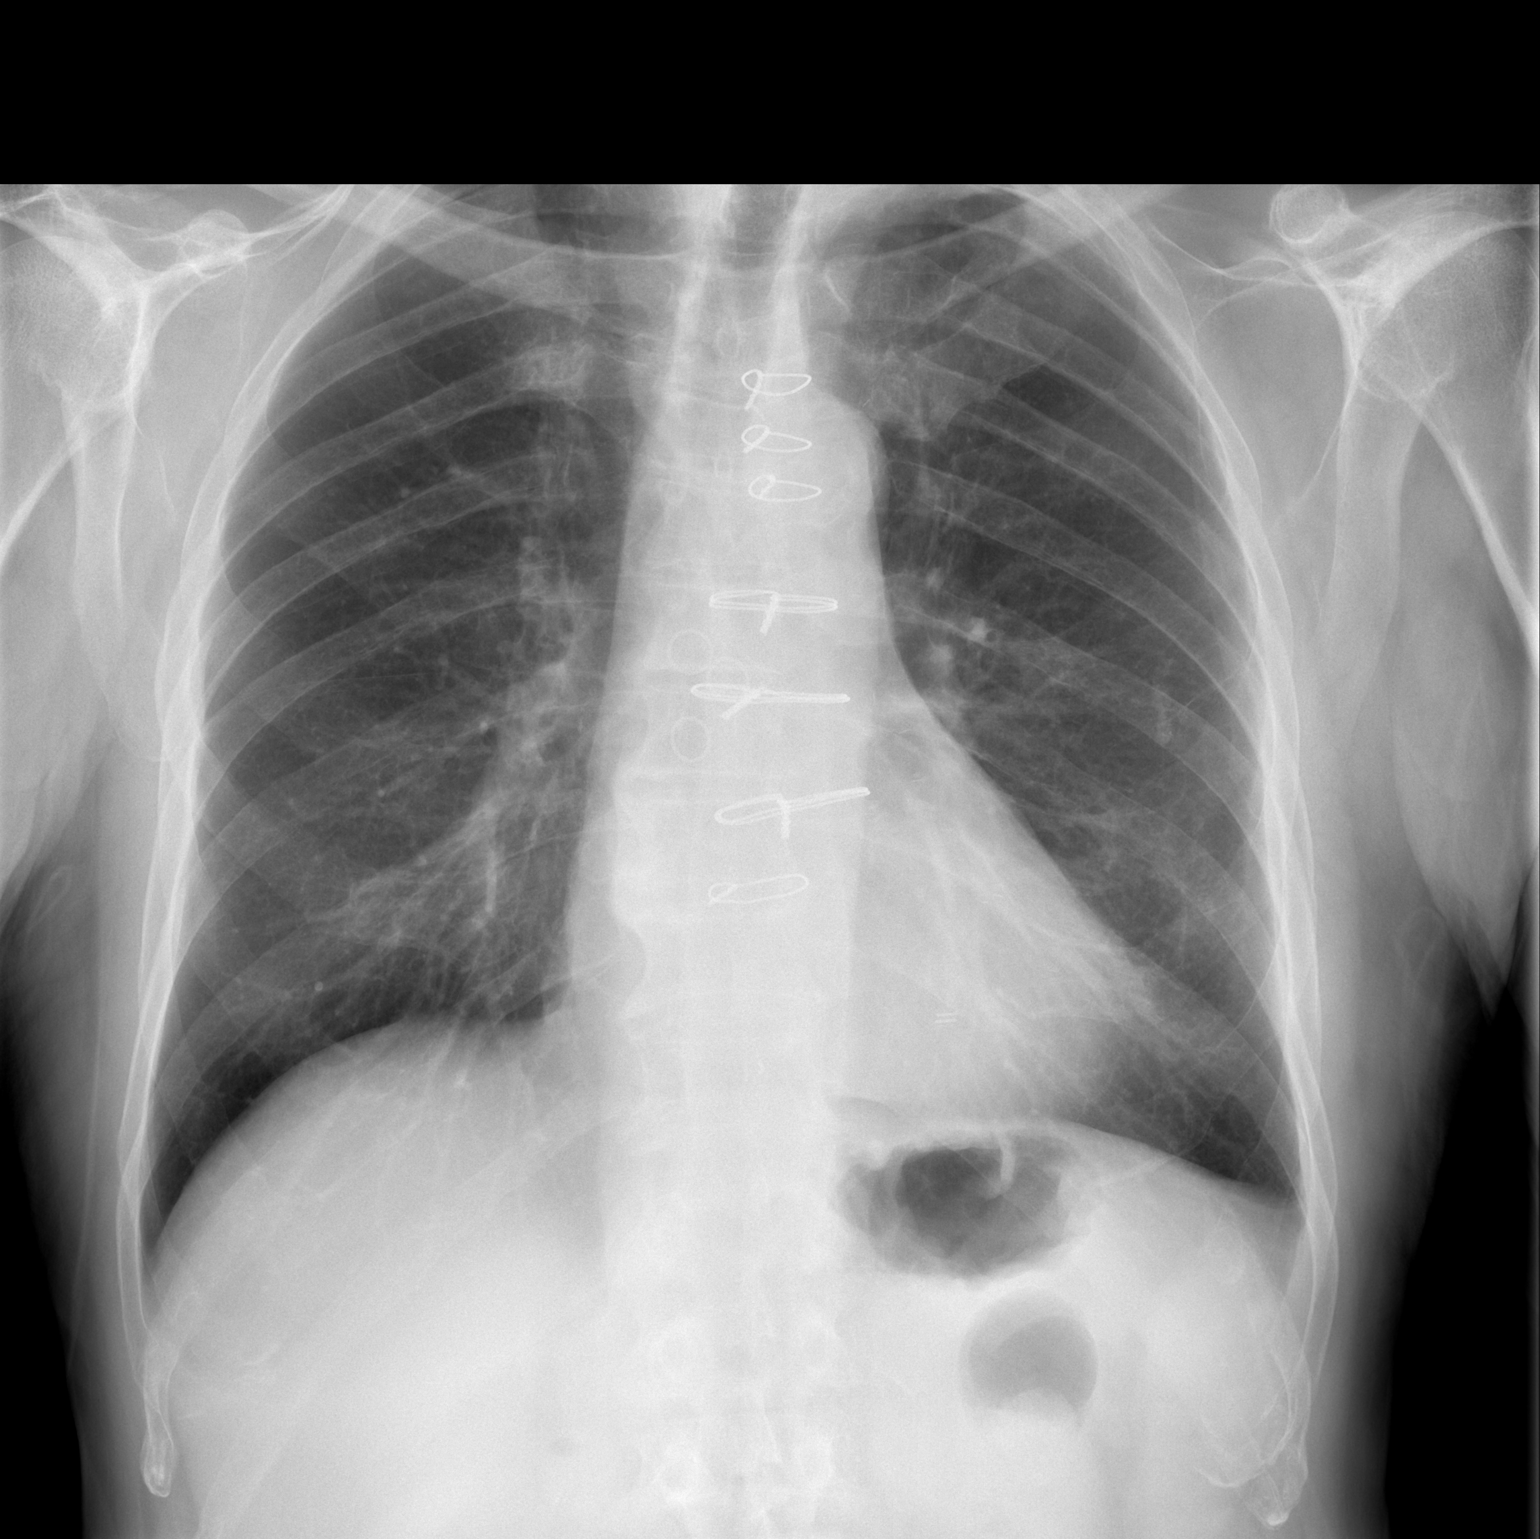

[w chest lat]
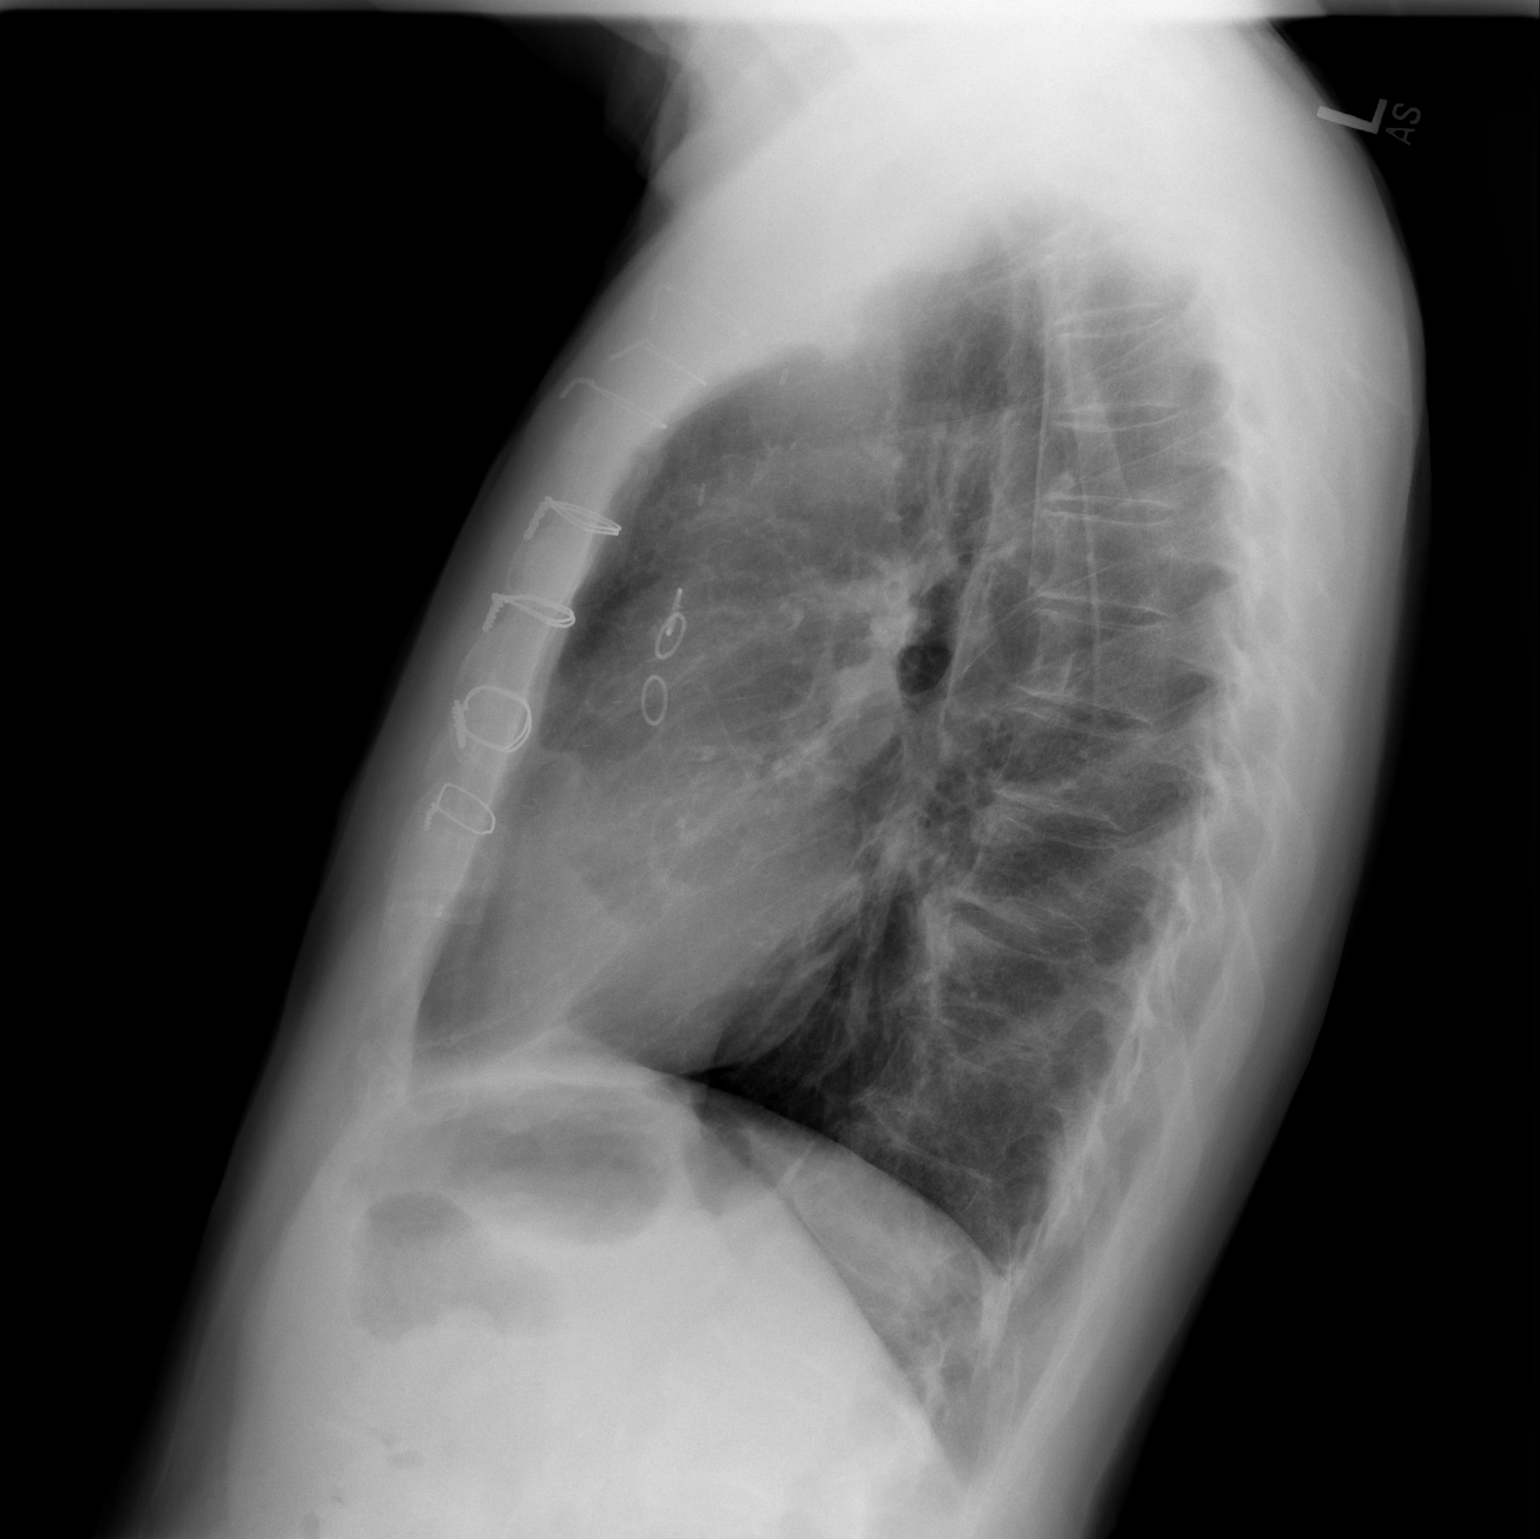

[2 of 2 positions shown; findings below may reference images not displayed]

FINDINGS: No active infiltrate or effusion is seen.  Mild apical
pleural thickening appears stable.  Mediastinal contours are
stable.  The heart is mildly enlarged.  Median sternotomy sutures
are noted from CABG.  There are degenerative changes throughout the
thoracic spine.
IMPRESSION: Stable chest x-ray.  No active lung disease..

## 2012-10-28 ENCOUNTER — Inpatient Hospital Stay (HOSPITAL_COMMUNITY)
Admission: EM | Admit: 2012-10-28 | Discharge: 2012-11-01 | DRG: 378 | Disposition: A | Discharge status: Home / Self Care | Payer: Medicare PPO | Attending: Internal Medicine | Admitting: Internal Medicine

## 2012-10-28 ENCOUNTER — Other Ambulatory Visit: Payer: Self-pay

## 2012-10-28 ENCOUNTER — Emergency Department (HOSPITAL_COMMUNITY): Payer: Medicare PPO

## 2012-10-28 ENCOUNTER — Encounter (HOSPITAL_COMMUNITY): Payer: Self-pay | Admitting: Emergency Medicine

## 2012-10-28 DIAGNOSIS — D500 Iron deficiency anemia secondary to blood loss (chronic): Secondary | ICD-10-CM | POA: Diagnosis present

## 2012-10-28 DIAGNOSIS — Z951 Presence of aortocoronary bypass graft: Secondary | ICD-10-CM

## 2012-10-28 DIAGNOSIS — F1210 Cannabis abuse, uncomplicated: Secondary | ICD-10-CM | POA: Diagnosis present

## 2012-10-28 DIAGNOSIS — I129 Hypertensive chronic kidney disease with stage 1 through stage 4 chronic kidney disease, or unspecified chronic kidney disease: Secondary | ICD-10-CM | POA: Diagnosis present

## 2012-10-28 DIAGNOSIS — K219 Gastro-esophageal reflux disease without esophagitis: Secondary | ICD-10-CM | POA: Diagnosis present

## 2012-10-28 DIAGNOSIS — J4489 Other specified chronic obstructive pulmonary disease: Secondary | ICD-10-CM | POA: Diagnosis present

## 2012-10-28 DIAGNOSIS — I214 Non-ST elevation (NSTEMI) myocardial infarction: Secondary | ICD-10-CM

## 2012-10-28 DIAGNOSIS — K922 Gastrointestinal hemorrhage, unspecified: Secondary | ICD-10-CM

## 2012-10-28 DIAGNOSIS — Z79899 Other long term (current) drug therapy: Secondary | ICD-10-CM

## 2012-10-28 DIAGNOSIS — Z981 Arthrodesis status: Secondary | ICD-10-CM

## 2012-10-28 DIAGNOSIS — N183 Chronic kidney disease, stage 3 unspecified: Secondary | ICD-10-CM | POA: Diagnosis present

## 2012-10-28 DIAGNOSIS — K921 Melena: Principal | ICD-10-CM | POA: Diagnosis present

## 2012-10-28 DIAGNOSIS — K648 Other hemorrhoids: Secondary | ICD-10-CM | POA: Diagnosis present

## 2012-10-28 DIAGNOSIS — M109 Gout, unspecified: Secondary | ICD-10-CM | POA: Diagnosis present

## 2012-10-28 DIAGNOSIS — I2510 Atherosclerotic heart disease of native coronary artery without angina pectoris: Secondary | ICD-10-CM

## 2012-10-28 DIAGNOSIS — Z8673 Personal history of transient ischemic attack (TIA), and cerebral infarction without residual deficits: Secondary | ICD-10-CM

## 2012-10-28 DIAGNOSIS — I5032 Chronic diastolic (congestive) heart failure: Secondary | ICD-10-CM | POA: Diagnosis present

## 2012-10-28 DIAGNOSIS — J449 Chronic obstructive pulmonary disease, unspecified: Secondary | ICD-10-CM | POA: Diagnosis present

## 2012-10-28 DIAGNOSIS — Z7982 Long term (current) use of aspirin: Secondary | ICD-10-CM

## 2012-10-28 DIAGNOSIS — I739 Peripheral vascular disease, unspecified: Secondary | ICD-10-CM | POA: Insufficient documentation

## 2012-10-28 DIAGNOSIS — I4949 Other premature depolarization: Secondary | ICD-10-CM | POA: Diagnosis present

## 2012-10-28 DIAGNOSIS — I10 Essential (primary) hypertension: Secondary | ICD-10-CM

## 2012-10-28 DIAGNOSIS — I4891 Unspecified atrial fibrillation: Secondary | ICD-10-CM | POA: Diagnosis present

## 2012-10-28 DIAGNOSIS — R748 Abnormal levels of other serum enzymes: Secondary | ICD-10-CM | POA: Diagnosis present

## 2012-10-28 DIAGNOSIS — F17200 Nicotine dependence, unspecified, uncomplicated: Secondary | ICD-10-CM | POA: Diagnosis present

## 2012-10-28 DIAGNOSIS — I509 Heart failure, unspecified: Secondary | ICD-10-CM | POA: Diagnosis present

## 2012-10-28 DIAGNOSIS — E785 Hyperlipidemia, unspecified: Secondary | ICD-10-CM | POA: Diagnosis present

## 2012-10-28 DIAGNOSIS — R079 Chest pain, unspecified: Secondary | ICD-10-CM

## 2012-10-28 DIAGNOSIS — I498 Other specified cardiac arrhythmias: Secondary | ICD-10-CM | POA: Diagnosis present

## 2012-10-28 LAB — COMPREHENSIVE METABOLIC PANEL
ALT: 8 U/L (ref 0–53)
AST: 10 U/L (ref 0–37)
Albumin: 3.1 g/dL — ABNORMAL LOW (ref 3.5–5.2)
CO2: 26 mEq/L (ref 19–32)
Chloride: 103 mEq/L (ref 96–112)
GFR calc non Af Amer: 35 mL/min — ABNORMAL LOW (ref >90)
Potassium: 3.7 mEq/L (ref 3.5–5.1)
Sodium: 137 mEq/L (ref 135–145)
Total Bilirubin: 0.2 mg/dL — ABNORMAL LOW (ref 0.3–1.2)

## 2012-10-28 LAB — CBC
Platelets: 171 10*3/uL (ref 150–400)
RBC: 3.38 MIL/uL — ABNORMAL LOW (ref 4.22–5.81)
RDW: 16.8 % — ABNORMAL HIGH (ref 11.5–15.5)
WBC: 7.9 10*3/uL (ref 4.0–10.5)

## 2012-10-28 LAB — LIPASE, BLOOD: Lipase: 24 U/L (ref 11–59)

## 2012-10-28 MED ORDER — ONDANSETRON HCL 4 MG PO TABS: 4.0000 mg | ORAL_TABLET | Freq: Four times a day (QID) | ORAL | Status: DC | PRN

## 2012-10-28 MED ORDER — ACETAMINOPHEN 650 MG RE SUPP: 650.0000 mg | Freq: Four times a day (QID) | RECTAL | Status: DC | PRN

## 2012-10-28 MED ORDER — TIOTROPIUM BROMIDE MONOHYDRATE 18 MCG IN CAPS
18.0000 ug | ORAL_CAPSULE | Freq: Every day | RESPIRATORY_TRACT | Status: DC
  Administered 2012-10-29 – 2012-11-01 (×4): 18 ug via RESPIRATORY_TRACT
  Filled 2012-10-28: qty 5

## 2012-10-28 MED ORDER — ALBUTEROL SULFATE (5 MG/ML) 0.5% IN NEBU: 2.5000 mg | INHALATION_SOLUTION | RESPIRATORY_TRACT | Status: DC | PRN

## 2012-10-28 MED ORDER — PANTOPRAZOLE SODIUM 40 MG IV SOLR
40.0000 mg | Freq: Once | INTRAVENOUS | Status: AC
  Administered 2012-10-28: 40 mg via INTRAVENOUS
  Filled 2012-10-28: qty 40

## 2012-10-28 MED ORDER — SODIUM CHLORIDE 0.9 % IV SOLN
1000.0000 mL | Freq: Once | INTRAVENOUS | Status: AC
  Administered 2012-10-28: 1000 mL via INTRAVENOUS

## 2012-10-28 MED ORDER — MELATONIN 3 MG PO TABS: 3.0000 | ORAL_TABLET | Freq: Every day | ORAL | Status: DC

## 2012-10-28 MED ORDER — SODIUM CHLORIDE 0.9 % IJ SOLN
3.0000 mL | Freq: Two times a day (BID) | INTRAMUSCULAR | Status: DC
  Administered 2012-10-29 – 2012-10-31 (×5): 3 mL via INTRAVENOUS

## 2012-10-28 MED ORDER — ATORVASTATIN CALCIUM 10 MG PO TABS
10.0000 mg | ORAL_TABLET | Freq: Every morning | ORAL | Status: DC
  Administered 2012-10-29 – 2012-11-01 (×4): 10 mg via ORAL
  Filled 2012-10-28 (×4): qty 1

## 2012-10-28 MED ORDER — MORPHINE SULFATE 4 MG/ML IJ SOLN
4.0000 mg | Freq: Once | INTRAMUSCULAR | Status: AC
  Administered 2012-10-28: 4 mg via INTRAVENOUS
  Filled 2012-10-28: qty 1

## 2012-10-28 MED ORDER — COLCHICINE 0.6 MG PO TABS
0.6000 mg | ORAL_TABLET | Freq: Every day | ORAL | Status: DC
  Administered 2012-10-29 – 2012-11-01 (×4): 0.6 mg via ORAL
  Filled 2012-10-28 (×4): qty 1

## 2012-10-28 MED ORDER — GUAIFENESIN ER 600 MG PO TB12
600.0000 mg | ORAL_TABLET | Freq: Two times a day (BID) | ORAL | Status: DC
  Administered 2012-10-28 – 2012-11-01 (×8): 600 mg via ORAL
  Filled 2012-10-28 (×9): qty 1

## 2012-10-28 MED ORDER — SODIUM CHLORIDE 0.9 % IV SOLN: 1000.0000 mL | INTRAVENOUS | Status: DC

## 2012-10-28 MED ORDER — ACETAMINOPHEN 325 MG PO TABS: 650.0000 mg | ORAL_TABLET | Freq: Four times a day (QID) | ORAL | Status: DC | PRN

## 2012-10-28 MED ORDER — SODIUM CHLORIDE 0.9 % IV SOLN
8.0000 mg/h | INTRAVENOUS | Status: DC
  Administered 2012-10-28 – 2012-10-31 (×5): 8 mg/h via INTRAVENOUS
  Filled 2012-10-28 (×13): qty 80

## 2012-10-28 MED ORDER — SODIUM CHLORIDE 0.9 % IV SOLN
INTRAVENOUS | Status: AC
  Administered 2012-10-28: 23:00:00 via INTRAVENOUS

## 2012-10-28 MED ORDER — HYDROCODONE-ACETAMINOPHEN 5-325 MG PO TABS
1.0000 | ORAL_TABLET | ORAL | Status: DC | PRN
  Administered 2012-10-28 – 2012-10-29 (×4): 1 via ORAL
  Administered 2012-10-29: 2 via ORAL
  Administered 2012-10-30: 1 via ORAL
  Administered 2012-10-30: 2 via ORAL
  Filled 2012-10-28 (×3): qty 1
  Filled 2012-10-28: qty 2
  Filled 2012-10-28: qty 1
  Filled 2012-10-28: qty 2
  Filled 2012-10-28: qty 1

## 2012-10-28 MED ORDER — DOCUSATE SODIUM 100 MG PO CAPS
100.0000 mg | ORAL_CAPSULE | Freq: Two times a day (BID) | ORAL | Status: DC
  Administered 2012-10-28 – 2012-11-01 (×6): 100 mg via ORAL
  Filled 2012-10-28 (×9): qty 1

## 2012-10-28 MED ORDER — ONDANSETRON HCL 4 MG/2ML IJ SOLN
4.0000 mg | Freq: Four times a day (QID) | INTRAMUSCULAR | Status: DC | PRN
  Administered 2012-10-29: 4 mg via INTRAVENOUS
  Filled 2012-10-28: qty 2

## 2012-10-28 MED ORDER — NITROGLYCERIN 0.4 MG SL SUBL
0.4000 mg | SUBLINGUAL_TABLET | SUBLINGUAL | Status: DC | PRN
  Administered 2012-10-28: 0.4 mg via SUBLINGUAL
  Filled 2012-10-28: qty 25

## 2012-10-28 NOTE — ED Provider Notes (Signed)
History     CSN: [id number]  Arrival date & time 10/28/12  1646   First MD Initiated Contact with Patient 10/28/12 1654      Chief Complaint  Patient presents with  . Rectal Bleeding    (Consider location/radiation/quality/duration/timing/severity/associated sxs/prior treatment) HPI Patient presents with concern of rectal bleeding.  He states that this began approximately 10 days ago, has progressed since onset.  There is concurrent generalized weakness, without focal pain, without rectal pain, without syncope or near-syncope. There is no associated chest pain, dyspnea, confusion, disorientation. Patient was seen by his physician tomorrow for here for further evaluation and management today. No clear alleviating or exacerbating factors. He was previous to taking 81 mg aspirin daily, this was stopped this week. Past Medical History  Diagnosis Date  . Aorto-iliac disease     bilat with stent 2007  . CAD (coronary artery disease)     with loop to the circumflex 2000, inferolateral infarction  . Hypertension   . Paroxysmal atrial flutter     ablation  . ED (erectile dysfunction)   . COPD (chronic obstructive pulmonary disease)   . Hyperlipidemia   . Stroke   . Shortness of breath   . GERD (gastroesophageal reflux disease)   . Gout     Past Surgical History  Procedure Laterality Date  . Spinal fusion    . Hand surgery    . Neck fusion  1975  . Eye surgery    . Coronary artery bypass graft  10/31/2011    Procedure: CORONARY ARTERY BYPASS GRAFTING (CABG);  Surgeon: Delight Ovens, MD;  Location: [facility];  Service: Open Heart Surgery;  Laterality: N/A;  times three using  Left Internal Mammary Artery and Right Greater Saphenous Vein Graft harvested endoscopically; TEE  . Endarterectomy  10/31/2011    Procedure: ENDARTERECTOMY CAROTID;  Surgeon: Nada Libman, MD;  Location: [facility] OR;  Service: Vascular;  Laterality: Right;  with patch angioplasty   . Carotid endarterectomy   10-31-2011    right    Family History  Problem Relation Age of Onset  . Cancer Mother     History  Substance Use Topics  . Smoking status: Current Some Day Smoker -- 0.50 packs/day for 64 years    Types: Cigarettes  . Smokeless tobacco: Never Used  . Alcohol Use: 3.6 oz/week    6 Shots of liquor per week     Comment: previous history of heavy alcohol use 12 pack of beer /day  now 4-5 drinks per week      Review of Systems  Constitutional:       Per HPI, otherwise negative  HENT:       Per HPI, otherwise negative  Respiratory:       Per HPI, otherwise negative  Cardiovascular:       Per HPI, otherwise negative  Gastrointestinal: Positive for nausea, diarrhea and blood in stool. Negative for vomiting and abdominal pain.  Endocrine:       Negative aside from HPI  Genitourinary:       Neg aside from HPI   Musculoskeletal:       Per HPI, otherwise negative  Skin: Negative.   Neurological: Negative for syncope.    Allergies  Review of patient's allergies indicates no known allergies.  Home Medications   Current Outpatient Rx  Name  Route  Sig  Dispense  Refill  . acetaminophen (TYLENOL) 325 MG tablet   Oral   Take 650 mg by mouth  at bedtime as needed for pain. For pain         . atorvastatin (LIPITOR) 10 MG tablet   Oral   Take 10 mg by mouth every morning.         . colchicine (COLCRYS) 0.6 MG tablet   Oral   Take 0.6 mg by mouth daily.         . diphenhydrAMINE (BENADRYL) 25 MG tablet   Oral   Take 25 mg by mouth at bedtime as needed for itching or sleep.         Marland Kitchen eplerenone (INSPRA) 25 MG tablet   Oral   Take 12.5 mg by mouth daily.         . furosemide (LASIX) 80 MG tablet   Oral   Take 40 mg by mouth daily.          . Lactobacillus-Inulin (CULTURELLE DIGESTIVE HEALTH) CAPS   Oral   Take 1 capsule by mouth daily.         . Melatonin 3 MG TABS   Oral   Take 3 tablets by mouth at bedtime.          . nitroGLYCERIN (NITROSTAT)  0.4 MG SL tablet   Sublingual   Place 0.4 mg under the tongue every 5 (five) minutes as needed for chest pain.         Marland Kitchen tiotropium (SPIRIVA) 18 MCG inhalation capsule   Inhalation   Place 18 mcg into inhaler and inhale daily.           BP 129/59  Pulse 84  Temp(Src) 98.3 F (36.8 C) (Oral)  Resp 16  SpO2 100%  Physical Exam  Nursing note and vitals reviewed. Constitutional: He is oriented to person, place, and time. He appears well-developed. No distress.  HENT:  Head: Normocephalic and atraumatic.  Eyes: Conjunctivae and EOM are normal.  Cardiovascular: Normal rate and regular rhythm.   Pulmonary/Chest: Effort normal. No stridor. No respiratory distress.  Midline sternal scar  Abdominal: He exhibits no distension.  Musculoskeletal: He exhibits no edema.  Neurological: He is alert and oriented to person, place, and time.  Skin: Skin is warm and dry.  Psychiatric: He has a normal mood and affect.    ED Course  Procedures (including critical care time)  Labs Reviewed  CBC  COMPREHENSIVE METABOLIC PANEL  LACTIC ACID, PLASMA  LIPASE, BLOOD  PROTIME-INR  TYPE AND SCREEN   No results found.   No diagnosis found.  5:52 PM Called to room because the patient is not complaining of pain across his anterior chest.  Patient hypertensive.  He states that the pain is similar to that he experienced during a heart attack previously. He remains awake and alert, appropriately interactive.  Pulse ox is good.  EKG, nitroglycerin, morphine ordered.    Date: 10/28/2012  Rate: 101  Rhythm: sinus tachycardia  QRS Axis: normal  Intervals: normal  ST/T Wave abnormalities: ST depressions anteriorly and ST depressions laterally  Conduction Disutrbances:NSIVCD  Narrative Interpretation:   Old EKG Reviewed: none available  Abnormal  On subsequent chart review, the ST depressions are largely consistent with an ecg  From 2013  O2- 99%ra, normal  Cardiac: 95 sr,  normal   6:21 PM] Pain resolved.  Rectal exam performed or grossly negative, Hemoccult pending  7:52 PM Patient seems comfortable.  He denies ongoing complaints.  MDM  This patient presents with ongoing rectal bleeding.  On exam he is medically stable, but during the emergency  department course he developed an episode of chest pain.  EKG is consistent with prior studies, and his pain resolves with analgesics.  He remained hemodynamically stable throughout.  Given the decrease in hemoglobin, the chest pain episode, he was admitted for further evaluation and management.        Gerhard Munch, MD 10/28/12 [phone]

## 2012-10-28 NOTE — ED Notes (Signed)
Pt states he is having CP and SOB, RR-24. EDP [place] notified.

## 2012-10-28 NOTE — ED Notes (Signed)
Pt sent here from PCP for red bloody stool x 10 days worse last night; pt sts some generalized weakness and noted hypotension

## 2012-10-28 NOTE — H&P (Signed)
PCP:  Lillia Mountain, MD  Cardiology: Verdis Prime GI: Laural Benes CHF: Evie Lacks Pulmonary: Write   Chief Complaint:  blood in stools   HPI: Nathan [name] is a 72 y.o. male   has a past medical history of Aorto-iliac disease; CAD (coronary artery disease); Hypertension; Paroxysmal atrial flutter; ED (erectile dysfunction); COPD (chronic obstructive pulmonary disease); Hyperlipidemia; Stroke; Shortness of breath; GERD (gastroesophageal reflux disease); and Gout.   Presented with  7-8 days of blood in stool. He describes it bright red blood in the bowl mixed with stool.  He has hx of Hemorrhoids.  His last colonoscopy was in 2000 showing polyp that was removed. He have had an other one 5 years later at the [place] which was unremarkable per patient and he was supposed to follow up in 10 years.  Patient went to his PCP 2 days ago and was asksed to be evaluated with Dr. Laural Benes with GI who he have seen today he was told to go to the hospital to have this further evaluated. At first the amount of bleeding was subsiding but this AM he had more blood in the bowl than the previous times. He also started to have some nausea and vomiting after eating which was non-bloody. Patient came to ED and was found to have Hg of 8.8 down from 10.1  That was 2 days ago.  Hospitalist was called for an admission.  While in ED he had an episode of chest pain lasting for about 1 hour but now resolved after getting nitroglycerine and morphine.    Review of Systems:    Pertinent positives include: nausea,  blood in stool,   Constitutional:  No weight loss, night sweats, Fevers, chills, fatigue, weight loss  HEENT:  No headaches, Difficulty swallowing,Tooth/dental problems,Sore throat,  No sneezing, itching, ear ache, nasal congestion, post nasal drip,  Cardio-vascular:  No chest pain, Orthopnea, PND, anasarca, dizziness, palpitations.no Bilateral lower extremity swelling  GI:  No heartburn, indigestion,  abdominal pain, vomiting, diarrhea, change in bowel habits, loss of appetite, melena,hematemesis Resp:  no shortness of breath at rest. No dyspnea on exertion, No excess mucus, no productive cough, No non-productive cough, No coughing up of blood.No change in color of mucus.No wheezing. Skin:  no rash or lesions. No jaundice GU:  no dysuria, change in color of urine, no urgency or frequency. No straining to urinate.  No flank pain.  Musculoskeletal:  No joint pain or no joint swelling. No decreased range of motion. No back pain.  Psych:  No change in mood or affect. No depression or anxiety. No memory loss.  Neuro: no localizing neurological complaints, no tingling, no weakness, no double vision, no gait abnormality, no slurred speech, no confusion  Otherwise ROS are negative except for above, 10 systems were reviewed  Past Medical History: Past Medical History  Diagnosis Date  . Aorto-iliac disease     bilat with stent 2007  . CAD (coronary artery disease)     with loop to the circumflex 2000, inferolateral infarction  . Hypertension   . Paroxysmal atrial flutter     ablation  . ED (erectile dysfunction)   . COPD (chronic obstructive pulmonary disease)   . Hyperlipidemia   . Stroke   . Shortness of breath   . GERD (gastroesophageal reflux disease)   . Gout    Past Surgical History  Procedure Laterality Date  . Spinal fusion    . Hand surgery    . Neck fusion  1975  .  Eye surgery    . Coronary artery bypass graft  10/31/2011    Procedure: CORONARY ARTERY BYPASS GRAFTING (CABG);  Surgeon: Delight Ovens, MD;  Location: [facility] OR;  Service: Open Heart Surgery;  Laterality: N/A;  times three using  Left Internal Mammary Artery and Right Greater Saphenous Vein Graft harvested endoscopically; TEE  . Endarterectomy  10/31/2011    Procedure: ENDARTERECTOMY CAROTID;  Surgeon: Nada Libman, MD;  Location: [facility] OR;  Service: Vascular;  Laterality: Right;  with patch angioplasty   .  Carotid endarterectomy  10-31-2011    right     Medications: Prior to Admission medications   Medication Sig Start Date End Date Taking? Authorizing Provider  acetaminophen (TYLENOL) 325 MG tablet Take 650 mg by mouth at bedtime as needed for pain. For pain   Yes Historical Provider, MD  aspirin 81 MG chewable tablet Chew 81 mg by mouth daily. Have not taking this for the past 3 days   Yes Historical Provider, MD  atorvastatin (LIPITOR) 10 MG tablet Take 10 mg by mouth every morning.   Yes Historical Provider, MD  colchicine (COLCRYS) 0.6 MG tablet Take 0.6 mg by mouth daily.   Yes Historical Provider, MD  diphenhydrAMINE (BENADRYL) 25 MG tablet Take 25 mg by mouth at bedtime as needed for itching or sleep.   Yes Historical Provider, MD  eplerenone (INSPRA) 25 MG tablet Take 12.5 mg by mouth daily.   Yes Historical Provider, MD  furosemide (LASIX) 80 MG tablet Take 40 mg by mouth daily.  04/22/12  Yes Simonne Martinet, NP  Lactobacillus-Inulin (CULTURELLE DIGESTIVE HEALTH) CAPS Take 1 capsule by mouth daily.   Yes Historical Provider, MD  Melatonin 3 MG TABS Take 3 tablets by mouth at bedtime.    Yes Historical Provider, MD  nitroGLYCERIN (NITROSTAT) 0.4 MG SL tablet Place 0.4 mg under the tongue every 5 (five) minutes as needed for chest pain.   Yes Historical Provider, MD  tiotropium (SPIRIVA) 18 MCG inhalation capsule Place 18 mcg into inhaler and inhale daily.   Yes Historical Provider, MD    Allergies:  No Known Allergies  Social History:  Ambulatory  independently   Lives at home with family   reports that he has been smoking Cigarettes.  He has a 32 pack-year smoking history. He has never used smokeless tobacco. He reports that he drinks about 3.6 ounces of alcohol per week. He reports that he uses illicit drugs (Marijuana).   Family History: family history includes Cancer in his mother; Heart disease in his father; and Tuberculosis in his mother.    Physical Exam: Patient  Vitals for the past 24 hrs:  BP Temp Temp src Pulse Resp SpO2  10/28/12 1845 118/64 mmHg - - 92 24 100 %  10/28/12 1816 125/62 mmHg - - 98 20 98 %  10/28/12 1815 125/62 mmHg - - 94 23 100 %  10/28/12 1800 165/132 mmHg - - 132 33 93 %  10/28/12 1745 141/124 mmHg - - 60 21 83 %  10/28/12 1723 129/59 mmHg 98.3 F (36.8 C) Oral 84 16 100 %  10/28/12 1715 129/59 mmHg - - 87 33 100 %  10/28/12 1654 153/64 mmHg 98.3 F (36.8 C) Oral 94 - 99 %    1. General:  in No Acute distress 2. Psychological: Alert and   Oriented 3. Head/ENT:   Moist  Mucous Membranes  Head Non traumatic, neck supple                          Normal   Dentition 4. SKIN:   decreased Skin turgor,  Skin clean Dry and intact no rash 5. Heart: Regular rate and rhythm no Murmur, Rub or gallop 6. Lungs:   no wheezes but occasional  Crackles, distant breath sounds that are course   7. Abdomen: Soft, non-tender, Non distended 8. Lower extremities: no clubbing, cyanosis, or edema 9. Neurologically Grossly intact, moving all 4 extremities equally 10. MSK: Normal range of motion Hemoccult positive from below body mass index is unknown because there is no weight on file.   Labs on Admission:   Recent Labs  10/28/12 1726  NA 137  K 3.7  CL 103  CO2 26  GLUCOSE 97  BUN 27*  CREATININE 1.82*  CALCIUM 8.4    Recent Labs  10/28/12 1726  AST 10  ALT 8  ALKPHOS 60  BILITOT 0.2*  PROT 5.7*  ALBUMIN 3.1*    Recent Labs  10/28/12 1726  LIPASE 24    Recent Labs  10/28/12 1726  WBC 7.9  HGB 8.8*  HCT 27.4*  MCV 81.1  PLT 171   No results found for this basename: CKTOTAL, CKMB, CKMBINDEX, TROPONINI,  in the last 72 hours No results found for this basename: TSH, T4TOTAL, FREET3, T3FREE, THYROIDAB,  in the last 72 hours No results found for this basename: VITAMINB12, FOLATE, FERRITIN, TIBC, IRON, RETICCTPCT,  in the last 72 hours Lab Results  Component Value Date   HGBA1C 5.4  04/03/2012    The CrCl is unknown because both a height and weight (above a minimum accepted value) are required for this calculation. ABG    Component Value Date/Time   PHART 7.323* 04/18/2012 0448   HCO3 24.3* 04/18/2012 0448   TCO2 25.7 04/18/2012 0448   ACIDBASEDEF 1.0 04/18/2012 0448   O2SAT 95.5 04/18/2012 0448     Lab Results  Component Value Date   DDIMER 0.70* 04/03/2012     Other results:  I have pearsonaly reviewed this: ECG REPORT  Rate: 102  Rhythm: sinus tachycardia w PVC's ST&T Change: no ischemia   Cultures:    Component Value Date/Time   SDES BLOOD LEFT ARM 04/18/2012 1249   SPECREQUEST BOTTLES DRAWN AEROBIC ONLY 10CC 04/18/2012 1249   CULT NO GROWTH 5 DAYS 04/18/2012 1249   REPTSTATUS 04/24/2012 FINAL 04/18/2012 1249       Radiological Exams on Admission: Dg Chest Port 1 View  10/28/2012   *RADIOLOGY REPORT*  Clinical Data: Chest pain shortness of breath with cough.  COPD.  PORTABLE CHEST - 1 VIEW  Comparison: 04/29/2012 chest radiograph  Findings: There are changes of median sternotomy for CABG.  The heart size appears stable given differences in portable technique.  10 mm nodular density projects over the posterior fourth rib. Density is not seen on prior chest radiograph.  Pulmonary nodule cannot be excluded.  There is peribronchial thickening at the left lung base.  Small opacity at the lateral left lung base could be atelectasis or mild airspace disease.  Linear density in the left midlung is stable and could be an area of focal scarring.  Pleural calcification not excluded.  Calcification in the subacromial space on the right suggests the possibility of calcific tendonitis.  IMPRESSION:  1.  Cannot exclude a 10 mm nodule in the right upper lung laterally.  Chest CT could be considered for further evaluation. If chest CT is performed for the primary purpose of evaluatiing for a lung nodule, it could be performed without intravenous contrast. 2.  Peribronchial  thickening at the left lung base with small opacity.  Left basilar bronchitis / pneumonia cannot be excluded. 3.  Question small pleural calcification versus area of scarring in the left mid lung.   Original Report Authenticated By: Britta Mccreedy, M.D.    Chart has been reviewed  Assessment/Plan  72 yo M w hx of COPD, A. Fib and CAD here with likely lower GI bleed and episode of atypical chest pain   Present on Admission:  GI bleed - most likely lower GI bleed. [facility] is aware will see in AM plan for most likey  Colonoscopy.  For now on protonix gtt, watch Hg and transfuse as needed. Given persistent tachycardia , episode  Of chest pain and hx of CAD will transfuse 1 unit.  Marland Kitchen Hypertension - continue home meds, no evidence of hypotension . COPD (chronic obstructive pulmonary disease),  - nebulizers PRN, spoke about stopping smoking but he is interested . Chronic kidney disease (CKD), stage III (moderate) - at baseline . Chronic diastolic CHF (congestive heart failure) - While having GI bleed would hold of on lasix, currently euvolemic . CAD (coronary artery disease) - will watch on telemetry, given episode of chest discomfort that now rapidly resolved will cycle CE.  Marland Kitchen Atrial fibrillation with controlled ventricular response - not a candidate for anticoagulation. Continue home medications, currently in sinus . Tobacco use disorder - patient no interested in quiting at this time.    Prophylaxis: SCD  Protonix  CODE STATUS: FULL CODE  as per patient  Other plan as per orders.  I have spent a total of 55 min on this admission  Jewel Venditto 10/28/2012, 8:37 PM

## 2012-10-29 ENCOUNTER — Inpatient Hospital Stay (HOSPITAL_COMMUNITY): Payer: Medicare PPO

## 2012-10-29 DIAGNOSIS — K922 Gastrointestinal hemorrhage, unspecified: Secondary | ICD-10-CM | POA: Diagnosis present

## 2012-10-29 LAB — PHOSPHORUS: Phosphorus: 2.8 mg/dL (ref 2.3–4.6)

## 2012-10-29 LAB — CBC
HCT: 25.1 % — ABNORMAL LOW (ref 39.0–52.0)
Hemoglobin: 8 g/dL — ABNORMAL LOW (ref 13.0–17.0)
Hemoglobin: 9.3 g/dL — ABNORMAL LOW (ref 13.0–17.0)
MCH: 25.7 pg — ABNORMAL LOW (ref 26.0–34.0)
MCV: 82.3 fL (ref 78.0–100.0)
Platelets: 149 10*3/uL — ABNORMAL LOW (ref 150–400)
RBC: 3.1 MIL/uL — ABNORMAL LOW (ref 4.22–5.81)
RBC: 3.32 MIL/uL — ABNORMAL LOW (ref 4.22–5.81)
RBC: 3.62 MIL/uL — ABNORMAL LOW (ref 4.22–5.81)
RDW: 16.9 % — ABNORMAL HIGH (ref 11.5–15.5)
WBC: 9.1 10*3/uL (ref 4.0–10.5)
WBC: 7.4 10*3/uL (ref 4.0–10.5)

## 2012-10-29 LAB — COMPREHENSIVE METABOLIC PANEL
BUN: 24 mg/dL — ABNORMAL HIGH (ref 6–23)
CO2: 23 mEq/L (ref 19–32)
Chloride: 107 mEq/L (ref 96–112)
Creatinine, Ser: 1.58 mg/dL — ABNORMAL HIGH (ref 0.50–1.35)
GFR calc Af Amer: 48 mL/min — ABNORMAL LOW (ref >90)
GFR calc non Af Amer: 42 mL/min — ABNORMAL LOW (ref >90)
Total Bilirubin: 0.4 mg/dL (ref 0.3–1.2)

## 2012-10-29 MED ORDER — ACETAMINOPHEN 325 MG PO TABS
650.0000 mg | ORAL_TABLET | Freq: Once | ORAL | Status: AC
  Administered 2012-10-29: 650 mg via ORAL
  Filled 2012-10-29: qty 2

## 2012-10-29 MED ORDER — SODIUM CHLORIDE 0.9 % IV SOLN
500.0000 mL | Freq: Once | INTRAVENOUS | Status: AC
  Administered 2012-10-29: 500 mL via INTRAVENOUS

## 2012-10-29 MED ORDER — DIPHENHYDRAMINE HCL 25 MG PO CAPS
25.0000 mg | ORAL_CAPSULE | Freq: Once | ORAL | Status: AC
  Administered 2012-10-29: 25 mg via ORAL
  Filled 2012-10-29: qty 1

## 2012-10-29 MED ORDER — PEG 3350-KCL-NA BICARB-NACL 420 G PO SOLR
4000.0000 mL | Freq: Once | ORAL | Status: AC
  Administered 2012-10-29: 4000 mL via ORAL
  Filled 2012-10-29: qty 4000

## 2012-10-29 MED ORDER — METOPROLOL TARTRATE 25 MG PO TABS
25.0000 mg | ORAL_TABLET | Freq: Two times a day (BID) | ORAL | Status: DC
  Administered 2012-10-29 – 2012-11-01 (×7): 25 mg via ORAL
  Filled 2012-10-29 (×8): qty 1

## 2012-10-29 MED ORDER — SODIUM CHLORIDE 0.9 % IV SOLN: INTRAVENOUS | Status: DC

## 2012-10-29 NOTE — Progress Notes (Signed)
Pt admitted into room 3309 with belongings. CCMD and [facility] notified about transfer. Will continue to monitor.

## 2012-10-29 NOTE — Progress Notes (Signed)
Subjective: No further hematochezia since admission.  No chest pain over night  Objective: Vital signs in last 24 hours: Temp:  [97.6 F (36.4 C)-98.5 F (36.9 C)] 98.2 F (36.8 C) (06/20 0551) Pulse Rate:  [60-132] 100 (06/20 0551) Resp:  [16-33] 29 (06/20 0551) BP: (118-182)/(59-132) 156/83 mmHg (06/20 0551) SpO2:  [83 %-100 %] 95 % (06/20 0551) Weight:  [85 kg (187 lb 6.3 oz)] 85 kg (187 lb 6.3 oz) (06/19 2225) Weight change:  Last BM Date: 10/28/12  Intake/Output from previous day: 06/19 0701 - 06/20 0700 In: 960 [P.O.:360; I.V.:600] Out: 450 [Urine:450] Intake/Output this shift:    General appearance: alert and cooperative Resp: clear to auscultation bilaterally Cardio: regular rate and rhythm, S1, S2 normal, no murmur, click, rub or gallop GI: soft, non-tender; bowel sounds normal; no masses,  no organomegaly Extremities: extremities normal, atraumatic, no cyanosis or edema  Lab Results:  Recent Labs  10/28/12 1726 10/29/12 0030  WBC 7.9 9.1  HGB 8.8* 8.0*  HCT 27.4* 25.1*  PLT 171 153   BMET  Recent Labs  10/28/12 1726  NA 137  K 3.7  CL 103  CO2 26  GLUCOSE 97  BUN 27*  CREATININE 1.82*  CALCIUM 8.4    Studies/Results: Dg Chest Port 1 View  10/28/2012   *RADIOLOGY REPORT*  Clinical Data: Chest pain shortness of breath with cough.  COPD.  PORTABLE CHEST - 1 VIEW  Comparison: 04/29/2012 chest radiograph  Findings: There are changes of median sternotomy for CABG.  The heart size appears stable given differences in portable technique.  10 mm nodular density projects over the posterior fourth rib. Density is not seen on prior chest radiograph.  Pulmonary nodule cannot be excluded.  There is peribronchial thickening at the left lung base.  Small opacity at the lateral left lung base could be atelectasis or mild airspace disease.  Linear density in the left midlung is stable and could be an area of focal scarring.  Pleural calcification not excluded.   Calcification in the subacromial space on the right suggests the possibility of calcific tendonitis.  IMPRESSION:  1.  Cannot exclude a 10 mm nodule in the right upper lung laterally.  Chest CT could be considered for further evaluation. If chest CT is performed for the primary purpose of evaluatiing for a lung nodule, it could be performed without intravenous contrast. 2.  Peribronchial thickening at the left lung base with small opacity.  Left basilar bronchitis / pneumonia cannot be excluded. 3.  Question small pleural calcification versus area of scarring in the left mid lung.   Original Report Authenticated By: Britta Mccreedy, M.D.    Medications: I have reviewed the patient's current medications.  Assessment/Plan: GI bleed - most likely lower GI bleed. [facility] is aware will see this AM plan for most likey Colonoscopy. For now on protonix gtt, has had 1U PRBC>   . Hypertension - diuretics held, BP mildly elevated . COPD (chronic obstructive pulmonary disease), - nebulizers PRN . Chronic kidney disease (CKD), stage III (moderate) - at baseline . Chronic diastolic CHF (congestive heart failure) - While having GI bleed would hold of on lasix, currently euvolemic . CAD (coronary artery disease) - will watch on telemetry, had episode of chest pain yesterday, this is not unusual for him.  Cardiac enzymes negative. He has been tachycardic, add low dose beta blocker . Atrial fibrillation with controlled ventricular response - not a candidate for anticoagulation, asa held. Continue home medications, currently in  sinus . Tobacco use disorder - patient no interested in quiting at this time    LOS: 1 day   [facility] 10/29/2012, 7:44 AM

## 2012-10-29 NOTE — Progress Notes (Signed)
Had episode of vomitting after drinking go-lytely about half of the gallon.  Medicated with zofran and MD called.  ALso notified of increase troponins. Will notify primary MD of increase troponins.

## 2012-10-29 NOTE — Consult Note (Signed)
Referring Provider: Dr. Doutova Primary Care Physician:  GRIFFIN,JOHN JOSEPH, MD Primary Gastroenterologist:  Dr. Johnson  Reason for Consultation:  Rectal bleeding  HPI: Nathan [name] is a 71 y.o. male who started having BRBPR this week and has had 7-8 episodes. He has been having LLQ at times since the bleeding started, which feels "like it buckles" there. Denies any other abdominal pain. Vomited after eating watermelon this week but otherwise denies any vomiting. Saw Dr. Johnson yesterday and was sent to the ER. No blood since yesterday. No BMs in 30+ hours per pt and normally goes daily. Hgb 8.8 (10.1 2 days ago). Colonoscopy reportedly in 2000 where polyp was removed and last one was in 2009 that reportedly was normal. Denies dizziness or lightheadedness. Denies NSAIDs or ETOH.         Past Medical History  Diagnosis Date  . Aorto-iliac disease     bilat with stent 2007  . CAD (coronary artery disease)     with loop to the circumflex 2000, inferolateral infarction  . Hypertension   . Paroxysmal atrial flutter     ablation  . ED (erectile dysfunction)   . COPD (chronic obstructive pulmonary disease)   . Hyperlipidemia   . Stroke   . Shortness of breath   . GERD (gastroesophageal reflux disease)   . Gout     Past Surgical History  Procedure Laterality Date  . Spinal fusion    . Hand surgery    . Neck fusion  1975  . Eye surgery    . Coronary artery bypass graft  10/31/2011    Procedure: CORONARY ARTERY BYPASS GRAFTING (CABG);  Surgeon: Edward B Gerhardt, MD;  Location: [facility] OR;  Service: Open Heart Surgery;  Laterality: N/A;  times three using  Left Internal Mammary Artery and Right Greater Saphenous Vein Graft harvested endoscopically; TEE  . Endarterectomy  10/31/2011    Procedure: ENDARTERECTOMY CAROTID;  Surgeon: Vance W Brabham, MD;  Location: [facility] OR;  Service: Vascular;  Laterality: Right;  with patch angioplasty   . Carotid endarterectomy  10-31-2011    right     Prior to Admission medications   Medication Sig Start Date End Date Taking? Authorizing Provider  acetaminophen (TYLENOL) 325 MG tablet Take 650 mg by mouth at bedtime as needed for pain. For pain   Yes Historical Provider, MD  aspirin 81 MG chewable tablet Chew 81 mg by mouth daily. Have not taking this for the past 3 days   Yes Historical Provider, MD  atorvastatin (LIPITOR) 10 MG tablet Take 10 mg by mouth every morning.   Yes Historical Provider, MD  colchicine (COLCRYS) 0.6 MG tablet Take 0.6 mg by mouth daily.   Yes Historical Provider, MD  diphenhydrAMINE (BENADRYL) 25 MG tablet Take 25 mg by mouth at bedtime as needed for itching or sleep.   Yes Historical Provider, MD  eplerenone (INSPRA) 25 MG tablet Take 12.5 mg by mouth daily.   Yes Historical Provider, MD  furosemide (LASIX) 80 MG tablet Take 40 mg by mouth daily.  04/22/12  Yes Peter E Babcock, NP  Lactobacillus-Inulin (CULTURELLE DIGESTIVE HEALTH) CAPS Take 1 capsule by mouth daily.   Yes Historical Provider, MD  Melatonin 3 MG TABS Take 3 tablets by mouth at bedtime.    Yes Historical Provider, MD  nitroGLYCERIN (NITROSTAT) 0.4 MG SL tablet Place 0.4 mg under the tongue every 5 (five) minutes as needed for chest pain.   Yes Historical Provider, MD  tiotropium (SPIRIVA) 18   MCG inhalation capsule Place 18 mcg into inhaler and inhale daily.   Yes Historical Provider, MD    Scheduled Meds: . atorvastatin  10 mg Oral q morning - 10a  . colchicine  0.6 mg Oral Daily  . docusate sodium  100 mg Oral BID  . guaiFENesin  600 mg Oral BID  . metoprolol tartrate  25 mg Oral BID  . sodium chloride  3 mL Intravenous Q12H  . tiotropium  18 mcg Inhalation Daily   Continuous Infusions: . pantoprozole (PROTONIX) infusion 8 mg/hr (10/29/12 0603)   PRN Meds:.acetaminophen, acetaminophen, albuterol, HYDROcodone-acetaminophen, nitroGLYCERIN, ondansetron (ZOFRAN) IV, ondansetron  Allergies as of 10/28/2012  . (No Known Allergies)     Family History  Problem Relation Age of Onset  . Cancer Mother   . Tuberculosis Mother   . Heart disease Father     History   Social History  . Marital Status: Married    Spouse Name: N/A    Number of Children: N/A  . Years of Education: N/A   Occupational History  . retired    Social History Main Topics  . Smoking status: Current Some Day Smoker -- 0.50 packs/day for 64 years    Types: Cigarettes  . Smokeless tobacco: Never Used  . 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 Drug Use: Yes    Special: Marijuana  . Sexually Active: Not on file   Other Topics Concern  . Not on file   Social History Narrative  . No narrative on file    Review of Systems: All negative from GI standpoint except as stated above in HPI.  Physical Exam: Vital signs: Filed Vitals:   10/29/12 0551  BP: 156/83  Pulse: 100  Temp: 98.2 F (36.8 C)  Resp: 29   Last BM Date: 10/28/12 General:   Elderly, Alert,  Well-developed, well-nourished, pleasant and cooperative in NAD HEENT: anicteric, oropharynx clear Lungs:  Clear throughout to auscultation.   No wheezes, crackles, or rhonchi. No acute distress. Heart:  Regular rate and rhythm; no murmurs, clicks, rubs,  or gallops. Abdomen: diffusely tender with guarding, soft, nondistended, +BS  Rectal:  Deferred Ext: no edema  GI:  Lab Results:  Recent Labs  10/28/12 1726 10/29/12 0030 10/29/12 0920  WBC 7.9 9.1 7.4  HGB 8.8* 8.0* 8.8*  HCT 27.4* 25.1* 27.0*  PLT 171 153 149*   BMET  Recent Labs  10/28/12 1726 10/29/12 0920  NA 137 140  K 3.7 4.1  CL 103 107  CO2 26 23  GLUCOSE 97 114*  BUN 27* 24*  CREATININE 1.82* 1.58*  CALCIUM 8.4 8.6   LFT  Recent Labs  10/29/12 0920  PROT 5.8*  ALBUMIN 3.1*  AST 12  ALT 8  ALKPHOS 62  BILITOT 0.4   PT/INR  Recent Labs  10/28/12 1726  LABPROT 13.6   INR 1.05     Studies/Results: Dg Chest Port 1 View  10/28/2012   *RADIOLOGY REPORT*  Clinical Data: Chest pain shortness of breath with cough.  COPD.  PORTABLE CHEST - 1 VIEW  Comparison: 04/29/2012 chest radiograph  Findings: There are changes of median sternotomy for CABG.  The heart size appears stable given differences in portable technique.  10 mm nodular density projects over the posterior fourth rib. Density is not seen on prior chest   radiograph.  Pulmonary nodule cannot be excluded.  There is peribronchial thickening at the left lung base.  Small opacity at the lateral left lung base could be atelectasis or mild airspace disease.  Linear density in the left midlung is stable and could be an area of focal scarring.  Pleural calcification not excluded.  Calcification in the subacromial space on the right suggests the possibility of calcific tendonitis.  IMPRESSION:  1.  Cannot exclude a 10 mm nodule in the right upper lung laterally.  Chest CT could be considered for further evaluation. If chest CT is performed for the primary purpose of evaluatiing for a lung nodule, it could be performed without intravenous contrast. 2.  Peribronchial thickening at the left lung base with small opacity.  Left basilar bronchitis / pneumonia cannot be excluded. 3.  Question small pleural calcification versus area of scarring in the left mid lung.   Original Report Authenticated By: Susan Turner, M.D.    Impression/Plan: 71 yo with GI bleed likely lower. Suspect diverticular vs ischemic. Supportive care. Abd xray and if no ileus then will give colon prep for colonoscopy for tomorrow. Clears. NPO p MN.    LOS: 1 day   Amrit Erck C.  10/29/2012, 10:36 AM   

## 2012-10-29 NOTE — Progress Notes (Signed)
Dr. Earl Gala notified of increased troponins.

## 2012-10-29 NOTE — Progress Notes (Signed)
Utilization review completed.  

## 2012-10-29 NOTE — Progress Notes (Signed)
Has complaints of numbness fingers right hand, +2 radial pulse, fingers cool and slightly pale. Patient feels cold, increased temp in room and feels better once warm.  Continue to watch.

## 2012-10-30 ENCOUNTER — Inpatient Hospital Stay (HOSPITAL_COMMUNITY): Payer: Medicare PPO

## 2012-10-30 ENCOUNTER — Encounter (HOSPITAL_COMMUNITY): Payer: Self-pay | Admitting: *Deleted

## 2012-10-30 ENCOUNTER — Encounter (HOSPITAL_COMMUNITY)
Admission: EM | Disposition: A | Discharge status: Home / Self Care | Payer: Self-pay | Source: Home / Self Care | Attending: Internal Medicine

## 2012-10-30 DIAGNOSIS — J449 Chronic obstructive pulmonary disease, unspecified: Secondary | ICD-10-CM

## 2012-10-30 HISTORY — PX: COLONOSCOPY: SHX5424

## 2012-10-30 LAB — CBC
HCT: 27.9 % — ABNORMAL LOW (ref 39.0–52.0)
MCH: 25.9 pg — ABNORMAL LOW (ref 26.0–34.0)
MCV: 82.1 fL (ref 78.0–100.0)
Platelets: 173 10*3/uL (ref 150–400)
RBC: 3.4 MIL/uL — ABNORMAL LOW (ref 4.22–5.81)
RDW: 16.8 % — ABNORMAL HIGH (ref 11.5–15.5)

## 2012-10-30 LAB — BASIC METABOLIC PANEL
CO2: 24 mEq/L (ref 19–32)
Calcium: 9 mg/dL (ref 8.4–10.5)
Chloride: 105 mEq/L (ref 96–112)
Creatinine, Ser: 1.52 mg/dL — ABNORMAL HIGH (ref 0.50–1.35)
Glucose, Bld: 108 mg/dL — ABNORMAL HIGH (ref 70–99)

## 2012-10-30 LAB — CK TOTAL AND CKMB (NOT AT ARMC)
CK, MB: 8.4 ng/mL (ref 0.3–4.0)
Relative Index: 7 — ABNORMAL HIGH (ref 0.0–2.5)
Total CK: 120 U/L (ref 7–232)

## 2012-10-30 LAB — PREPARE RBC (CROSSMATCH)

## 2012-10-30 LAB — TROPONIN I: Troponin I: 1.19 ng/mL (ref <0.30)

## 2012-10-30 SURGERY — COLONOSCOPY
Anesthesia: Moderate Sedation

## 2012-10-30 MED ORDER — FENTANYL CITRATE 0.05 MG/ML IJ SOLN
INTRAMUSCULAR | Status: DC | PRN
  Administered 2012-10-30 (×5): 12.5 ug via INTRAVENOUS

## 2012-10-30 MED ORDER — DIPHENHYDRAMINE HCL 50 MG/ML IJ SOLN
INTRAMUSCULAR | Status: AC
  Filled 2012-10-30: qty 1

## 2012-10-30 MED ORDER — MIDAZOLAM HCL 5 MG/ML IJ SOLN
INTRAMUSCULAR | Status: AC
  Filled 2012-10-30: qty 3

## 2012-10-30 MED ORDER — MIDAZOLAM HCL 10 MG/2ML IJ SOLN
INTRAMUSCULAR | Status: DC | PRN
  Administered 2012-10-30 (×4): 1 mg via INTRAVENOUS

## 2012-10-30 MED ORDER — FENTANYL CITRATE 0.05 MG/ML IJ SOLN
INTRAMUSCULAR | Status: AC
  Filled 2012-10-30: qty 4

## 2012-10-30 MED ORDER — TECHNETIUM TC 99M-LABELED RED BLOOD CELLS IV KIT
20.0000 | PACK | Freq: Once | INTRAVENOUS | Status: AC | PRN
  Administered 2012-10-30: 20 via INTRAVENOUS

## 2012-10-30 NOTE — Interval H&P Note (Signed)
History and Physical Interval Note:  10/30/2012 2:35 PM  Nathan [name]  has presented today for surgery, with the diagnosis of GI Bleed  The various methods of treatment have been discussed with the patient and family. After consideration of risks, benefits and other options for treatment, the patient has consented to  Procedure(s): COLONOSCOPY (N/A) as a surgical intervention .  The patient's history has been reviewed, patient examined, no change in status, stable for surgery.  I have reviewed the patient's chart and labs.  Questions were answered to the patient's satisfaction.     Armanie Ullmer C.

## 2012-10-30 NOTE — Op Note (Signed)
[facility] [address] [place], [address]   COLONOSCOPY PROCEDURE REPORT  PATIENT: Nathan [name], Nathan [name]  MR#: [id number] BIRTHDATE: September 02, 1940 , 71  yrs. old GENDER: Male ENDOSCOPIST: Charlott Rakes, MD REFERRED ON:[id number] team      ; Dr. Kirby Funk PROCEDURE DATE:  10/30/2012 PROCEDURE:   Colonoscopy, diagnostic ASA CLASS:   Class III INDICATIONS:hematochezia. MEDICATIONS: Fentanyl 62.5 mcg IV and Versed 4 mg IV  DESCRIPTION OF PROCEDURE:   After the risks benefits and alternatives of the procedure were thoroughly explained, informed consent was obtained.  The Pentax Ped Colon [id number]  endoscope was introduced through the anus and advanced to the cecum, which was identified by both the appendix and ileocecal valve , limited by No adverse events experienced.   The quality of the prep was poor. . The instrument was then slowly withdrawn as the colon was fully examined.     FINDINGS:  Rectal exam unremarkable.  Pediatric colonoscope inserted into the colon and advanced to the cecum, where the appendiceal orifice and ileocecal valve were identified.  A large amount of dark brown and red-tinged semi-solid and liquid stool noted throughout the colon with large amounts of pooling in the left colon. Moderate amounts in right colon. No source of bleeding identified and no diverticuli seen but large amount of colonic mucosa not visualized due to stool and blood products. The terminal ileum was intubated and minimal blood noted near ICV and no blood seen on further advancement into terminal ileum.  On careful withdrawal of the colonoscope again large amount of stool and blood precluded adequate visualization of colon and no source identified. Retroflexion revealed small internal hemorrhoids  COMPLICATIONS: None  IMPRESSION:     Colonic bleed - source NOT identified; suspect diverticular vs AVM Small internal hemorrhoids Normal terminal  ileum  RECOMMENDATIONS: RBC scan to try and pinpoint site of bleeding; Follow H/Hs; Supportive care; NPO    ______________________________ eSignedCharlott Rakes, MD 10/30/2012 3:34 PM   LK:GMWN Valentina Lucks, MD  PATIENT NAME:  Nathan [name], Nathan [name] MR#: [id number]

## 2012-10-30 NOTE — H&P (View-Only) (Signed)
Referring Provider: Dr. Adela Glimpse Primary Care Physician:  Lillia Mountain, MD Primary Gastroenterologist:  Dr. Laural Benes  Reason for Consultation:  Rectal bleeding  HPI: Nathan [name] is a 72 y.o. male who started having BRBPR this week and has had 7-8 episodes. He has been having LLQ at times since the bleeding started, which feels "like it buckles" there. Denies any other abdominal pain. Vomited after eating watermelon this week but otherwise denies any vomiting. Saw Dr. Laural Benes yesterday and was sent to the ER. No blood since yesterday. No BMs in 30+ hours per pt and normally goes daily. Hgb 8.8 (10.1 2 days ago). Colonoscopy reportedly in 2000 where polyp was removed and last one was in 2009 that reportedly was normal. Denies dizziness or lightheadedness. Denies NSAIDs or ETOH.         Past Medical History  Diagnosis Date  . Aorto-iliac disease     bilat with stent 2007  . CAD (coronary artery disease)     with loop to the circumflex 2000, inferolateral infarction  . Hypertension   . Paroxysmal atrial flutter     ablation  . ED (erectile dysfunction)   . COPD (chronic obstructive pulmonary disease)   . Hyperlipidemia   . Stroke   . Shortness of breath   . GERD (gastroesophageal reflux disease)   . Gout     Past Surgical History  Procedure Laterality Date  . Spinal fusion    . Hand surgery    . Neck fusion  1975  . Eye surgery    . Coronary artery bypass graft  10/31/2011    Procedure: CORONARY ARTERY BYPASS GRAFTING (CABG);  Surgeon: Delight Ovens, MD;  Location: [facility] OR;  Service: Open Heart Surgery;  Laterality: N/A;  times three using  Left Internal Mammary Artery and Right Greater Saphenous Vein Graft harvested endoscopically; TEE  . Endarterectomy  10/31/2011    Procedure: ENDARTERECTOMY CAROTID;  Surgeon: Nada Libman, MD;  Location: [facility] OR;  Service: Vascular;  Laterality: Right;  with patch angioplasty   . Carotid endarterectomy  10-31-2011    right     Prior to Admission medications   Medication Sig Start Date End Date Taking? Authorizing Provider  acetaminophen (TYLENOL) 325 MG tablet Take 650 mg by mouth at bedtime as needed for pain. For pain   Yes Historical Provider, MD  aspirin 81 MG chewable tablet Chew 81 mg by mouth daily. Have not taking this for the past 3 days   Yes Historical Provider, MD  atorvastatin (LIPITOR) 10 MG tablet Take 10 mg by mouth every morning.   Yes Historical Provider, MD  colchicine (COLCRYS) 0.6 MG tablet Take 0.6 mg by mouth daily.   Yes Historical Provider, MD  diphenhydrAMINE (BENADRYL) 25 MG tablet Take 25 mg by mouth at bedtime as needed for itching or sleep.   Yes Historical Provider, MD  eplerenone (INSPRA) 25 MG tablet Take 12.5 mg by mouth daily.   Yes Historical Provider, MD  furosemide (LASIX) 80 MG tablet Take 40 mg by mouth daily.  04/22/12  Yes Simonne Martinet, NP  Lactobacillus-Inulin (CULTURELLE DIGESTIVE HEALTH) CAPS Take 1 capsule by mouth daily.   Yes Historical Provider, MD  Melatonin 3 MG TABS Take 3 tablets by mouth at bedtime.    Yes Historical Provider, MD  nitroGLYCERIN (NITROSTAT) 0.4 MG SL tablet Place 0.4 mg under the tongue every 5 (five) minutes as needed for chest pain.   Yes Historical Provider, MD  tiotropium (SPIRIVA) 18  MCG inhalation capsule Place 18 mcg into inhaler and inhale daily.   Yes Historical Provider, MD    Scheduled Meds: . atorvastatin  10 mg Oral q morning - 10a  . colchicine  0.6 mg Oral Daily  . docusate sodium  100 mg Oral BID  . guaiFENesin  600 mg Oral BID  . metoprolol tartrate  25 mg Oral BID  . sodium chloride  3 mL Intravenous Q12H  . tiotropium  18 mcg Inhalation Daily   Continuous Infusions: . pantoprozole (PROTONIX) infusion 8 mg/hr (10/29/12 0603)   PRN Meds:.acetaminophen, acetaminophen, albuterol, HYDROcodone-acetaminophen, nitroGLYCERIN, ondansetron (ZOFRAN) IV, ondansetron  Allergies as of 10/28/2012  . (No Known Allergies)     Family History  Problem Relation Age of Onset  . Cancer Mother   . Tuberculosis Mother   . Heart disease Father     History   Social History  . Marital Status: Married    Spouse Name: N/A    Number of Children: N/A  . Years of Education: N/A   Occupational History  . retired    Social History Main Topics  . Smoking status: Current Some Day Smoker -- 0.50 packs/day for 64 years    Types: Cigarettes  . Smokeless tobacco: Never Used  . 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 Drug Use: Yes    Special: Marijuana  . Sexually Active: Not on file   Other Topics Concern  . Not on file   Social History Narrative  . No narrative on file    Review of Systems: All negative from GI standpoint except as stated above in HPI.  Physical Exam: Vital signs: Filed Vitals:   10/29/12 0551  BP: 156/83  Pulse: 100  Temp: 98.2 F (36.8 C)  Resp: 29   Last BM Date: 10/28/12 General:   Elderly, Alert,  Well-developed, well-nourished, pleasant and cooperative in NAD HEENT: anicteric, oropharynx clear Lungs:  Clear throughout to auscultation.   No wheezes, crackles, or rhonchi. No acute distress. Heart:  Regular rate and rhythm; no murmurs, clicks, rubs,  or gallops. Abdomen: diffusely tender with guarding, soft, nondistended, +BS  Rectal:  Deferred Ext: no edema  GI:  Lab Results:  Recent Labs  10/28/12 1726 10/29/12 0030 10/29/12 0920  WBC 7.9 9.1 7.4  HGB 8.8* 8.0* 8.8*  HCT 27.4* 25.1* 27.0*  PLT 171 153 149*   BMET  Recent Labs  10/28/12 1726 10/29/12 0920  NA 137 140  K 3.7 4.1  CL 103 107  CO2 26 23  GLUCOSE 97 114*  BUN 27* 24*  CREATININE 1.82* 1.58*  CALCIUM 8.4 8.6   LFT  Recent Labs  10/29/12 0920  PROT 5.8*  ALBUMIN 3.1*  AST 12  ALT 8  ALKPHOS 62  BILITOT 0.4   PT/INR  Recent Labs  10/28/12 1726  LABPROT 13.6   INR 1.05     Studies/Results: Dg Chest Port 1 View  10/28/2012   *RADIOLOGY REPORT*  Clinical Data: Chest pain shortness of breath with cough.  COPD.  PORTABLE CHEST - 1 VIEW  Comparison: 04/29/2012 chest radiograph  Findings: There are changes of median sternotomy for CABG.  The heart size appears stable given differences in portable technique.  10 mm nodular density projects over the posterior fourth rib. Density is not seen on prior chest  radiograph.  Pulmonary nodule cannot be excluded.  There is peribronchial thickening at the left lung base.  Small opacity at the lateral left lung base could be atelectasis or mild airspace disease.  Linear density in the left midlung is stable and could be an area of focal scarring.  Pleural calcification not excluded.  Calcification in the subacromial space on the right suggests the possibility of calcific tendonitis.  IMPRESSION:  1.  Cannot exclude a 10 mm nodule in the right upper lung laterally.  Chest CT could be considered for further evaluation. If chest CT is performed for the primary purpose of evaluatiing for a lung nodule, it could be performed without intravenous contrast. 2.  Peribronchial thickening at the left lung base with small opacity.  Left basilar bronchitis / pneumonia cannot be excluded. 3.  Question small pleural calcification versus area of scarring in the left mid lung.   Original Report Authenticated By: Britta Mccreedy, M.D.    Impression/Plan: 72 yo with GI bleed likely lower. Suspect diverticular vs ischemic. Supportive care. Abd xray and if no ileus then will give colon prep for colonoscopy for tomorrow. Clears. NPO p MN.    LOS: 1 day   Levaeh Vice C.  10/29/2012, 10:36 AM

## 2012-10-30 NOTE — Progress Notes (Signed)
Pt. Back from Endo, vss, will continue to monitor.  Vivi Martens RN

## 2012-10-30 NOTE — Progress Notes (Signed)
Assessment/Plan: Principal Problem:   Lower GI bleed - may be continuing slightly. Got one unit of blood and bumped Hb from 8.0 to 9.3 and now back down to 8.8. Will give more blood given positive troponins. See comments below.  Active Problems:   Elevated troponin - discussed with Dr. Bosie Clos. Although he is prepped for colonoscopy at 1 PM today, Dr. Bosie Clos notes that we can delay if needed. He could remain on clear liquids. Will repeat troponin now (and add CKMB)   Hypertension   CAD (coronary artery disease)   COPD (chronic obstructive pulmonary disease), gold stage C.   Atrial fibrillation with controlled ventricular response   Chronic kidney disease (CKD), stage III (moderate)   Chronic diastolic CHF (congestive heart failure)   Tobacco use disorder   Acute blood loss anemia - see above about transfusion.    Subjective: Patient is comfortable this morning. He had some chest pain just before admit but only minor discomfort since then. He is still seeing some BRBPR as he preps for colonoscopy.   No shortness of breath. No abdominal pain.   Objective:  Vital Signs: Filed Vitals:   10/29/12 2256 10/30/12 0459 10/30/12 0735 10/30/12 0811  BP: 156/73 143/74 139/67   Pulse: 84 83 77   Temp: 97.7 F (36.5 C) 98.2 F (36.8 C)  97.8 F (36.6 C)  TempSrc: Oral Oral  Oral  Resp: 17 18 22    Height:      Weight:      SpO2: 95% 97% 98%      EXAM: Lungs clear.    Intake/Output Summary (Last 24 hours) at 10/30/12 0845 Last data filed at 10/30/12 0600  Gross per 24 hour  Intake   4035 ml  Output    300 ml  Net   3735 ml    Lab Results:  Recent Labs  10/29/12 0920 10/30/12 0440  NA 140 137  K 4.1 4.4  CL 107 105  CO2 23 24  GLUCOSE 114* 108*  BUN 24* 25*  CREATININE 1.58* 1.52*  CALCIUM 8.6 9.0  MG 1.9  --   PHOS 2.8  --     Recent Labs  10/28/12 1726 10/29/12 0920  AST 10 12  ALT 8 8  ALKPHOS 60 62  BILITOT 0.2* 0.4  PROT 5.7* 5.8*  ALBUMIN 3.1* 3.1*     Recent Labs  10/28/12 1726  LIPASE 24    Recent Labs  10/29/12 1903 10/30/12 0440  WBC 12.4* 10.2  HGB 9.3* 8.8*  HCT 29.8* 27.9*  MCV 82.3 82.1  PLT 195 173    Recent Labs  10/29/12 0030 10/29/12 0920 10/29/12 1215  TROPONINI <0.30 0.99* 1.30*   No components found with this basename: POCBNP,  No results found for this basename: DDIMER,  in the last 72 hours No results found for this basename: HGBA1C,  in the last 72 hours No results found for this basename: CHOL, HDL, LDLCALC, TRIG, CHOLHDL, LDLDIRECT,  in the last 72 hours  Recent Labs  10/29/12 0920  TSH 1.837   No results found for this basename: VITAMINB12, FOLATE, FERRITIN, TIBC, IRON, RETICCTPCT,  in the last 72 hours  Studies/Results: Dg Chest Port 1 View  10/28/2012   *RADIOLOGY REPORT*  Clinical Data: Chest pain shortness of breath with cough.  COPD.  PORTABLE CHEST - 1 VIEW  Comparison: 04/29/2012 chest radiograph  Findings: There are changes of median sternotomy for CABG.  The heart size appears stable given differences in portable  technique.  10 mm nodular density projects over the posterior fourth rib. Density is not seen on prior chest radiograph.  Pulmonary nodule cannot be excluded.  There is peribronchial thickening at the left lung base.  Small opacity at the lateral left lung base could be atelectasis or mild airspace disease.  Linear density in the left midlung is stable and could be an area of focal scarring.  Pleural calcification not excluded.  Calcification in the subacromial space on the right suggests the possibility of calcific tendonitis.  IMPRESSION:  1.  Cannot exclude a 10 mm nodule in the right upper lung laterally.  Chest CT could be considered for further evaluation. If chest CT is performed for the primary purpose of evaluatiing for a lung nodule, it could be performed without intravenous contrast. 2.  Peribronchial thickening at the left lung base with small opacity.  Left basilar  bronchitis / pneumonia cannot be excluded. 3.  Question small pleural calcification versus area of scarring in the left mid lung.   Original Report Authenticated By: Britta Mccreedy, M.D.   Dg Abd 2 Views  10/29/2012   *RADIOLOGY REPORT*  Clinical Data: Abdominal pain with nausea vomiting.  Blood in stool for 7-10 days.  ABDOMEN - 2 VIEW  Comparison: 11/07/2011  Findings: Normal bowel gas pattern.  No evidence of obstruction or generalized adynamic ileus.  No free air.  There are bilateral common iliac artery stents.  Aortoiliac vascular calcifications are noted.  Small densities superimposed over the right renal shadow are also likely vascular calcifications.  The soft tissues are otherwise unremarkable.  IMPRESSION: No acute findings.  No obstruction or free air.   Original Report Authenticated By: Amie Portland, M.D.   Medications: Medications administered in the last 24 hours reviewed.  Current Medication List reviewed.    LOS: 2 days   [facility] Internal Medicine @ [facility] [phone]) 10/30/2012, 8:45 AM

## 2012-10-30 NOTE — Progress Notes (Signed)
Nursing Pt having difficulty drinking golyteli.  Encouraged pt to continue to drink entire gallon.  Pt slow to drink despite repeated attempts and encouragement.  Stools bloody but liquid.

## 2012-10-30 NOTE — Progress Notes (Signed)
CRITICAL VALUE ALERT  Critical value received: CKMB 8.4  Date of notification:  10/30/12  Time of notification:  11:10  Critical value read back:yes  Nurse who received alert:  Vivi Martens RN   MD notified (1st page):  Earl Gala  Time of first page:  11:14  MD notified (2nd page):  Time of second page:  Responding MD:  Earl Gala  Time MD responded:  11:18  No new orders received

## 2012-10-30 NOTE — Brief Op Note (Signed)
Large amount of stool and blood products. No active bleeding seen but could still be occurring due to large amount of stool and blood products present obscuring an adequate view. No source identified. See endopro for details. RBC scan would be the next step to try and pinpoint the source of the bleed.

## 2012-10-30 NOTE — Consult Note (Signed)
CARDIOLOGY CONSULT NOTE    Patient ID: Nathan KIENER MRN: [id number] DOB/AGE: 09-26-1940 72 y.o.  Admit date: 10/28/2012 Referring Physician:  Valentina Lucks Primary Physician: Lillia Mountain, MD Primary Cardiologist:  Carolan Shiver Reason for Consultation:  Elevated troponin CAD  Principal Problem:   Lower GI bleed Active Problems:   Hypertension   CAD (coronary artery disease)   COPD (chronic obstructive pulmonary disease), gold stage C.   Atrial fibrillation with controlled ventricular response   Chronic kidney disease (CKD), stage III (moderate)   Chronic diastolic CHF (congestive heart failure)   Tobacco use disorder   Elevated troponin   HPI:  72 yo patient admitted for GI bleed.  Already had prep for colonoscopy today  At 2:00  Cardiac history  includes CAD s/p CABG x4 (LIMA-LAD, SVG-Diag, SVG-OM1 and SVG-PDA) in June 2013, AFL s/p ablation, diastolic heart failure, hypertension. He also has history of severe COPD previously on home O2, chronic renal failure (baseline Cr 1.5-1.7), stroke and GERD.    He was admitted in November 2013 for NSTEMI, acute respiratory failure and altered mental status. He underwent emergent catheterization showed patent SVG-RCA and LIMA-LAD but total occlusion of SVG-OM as well as SVG-diag. Medical therapy was recommended. Echo 11/13; EF 50-55% with mild-mod MR  Cardiac status has been stable he has not used nitro Has some atypical chest pains across chest that are fleeting and he does not use nitro for them  Still smoking but no longer on home oxygen    All other systems reviewed and negative except as noted above  Past Medical History  Diagnosis Date  . Aorto-iliac disease     bilat with stent 2007  . CAD (coronary artery disease)     with loop to the circumflex 2000, inferolateral infarction  . Hypertension   . Paroxysmal atrial flutter     ablation  . ED (erectile dysfunction)   . COPD (chronic obstructive pulmonary disease)   .  Hyperlipidemia   . Stroke   . Shortness of breath   . GERD (gastroesophageal reflux disease)   . Gout     Family History  Problem Relation Age of Onset  . Cancer Mother   . Tuberculosis Mother   . Heart disease Father     History   Social History  . Marital Status: Married    Spouse Name: N/A    Number of Children: N/A  . Years of Education: N/A   Occupational History  . retired    Social History Main Topics  . Smoking status: Current Some Day Smoker -- 0.50 packs/day for 64 years    Types: Cigarettes  . Smokeless tobacco: Never Used  . 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 Drug Use: Yes    Special: Marijuana  . Sexually Active: Not on file   Other Topics Concern  . Not on file   Social History Narrative  . No narrative on file    Past Surgical History  Procedure Laterality Date  . Spinal fusion    . Hand surgery    . Neck fusion  1975  . Eye surgery    . Coronary artery bypass graft  10/31/2011    Procedure: CORONARY ARTERY BYPASS GRAFTING (CABG);  Surgeon: Delight Ovens, MD;  Location: [facility] OR;  Service: Open Heart Surgery;  Laterality: N/A;  times three using  Left Internal Mammary Artery and Right Greater Saphenous Vein Graft harvested endoscopically; TEE  . Endarterectomy  10/31/2011    Procedure: ENDARTERECTOMY CAROTID;  Surgeon: Nada Libman, MD;  Location: [facility] OR;  Service: Vascular;  Laterality: Right;  with patch angioplasty   . Carotid endarterectomy  10-31-2011    right     . atorvastatin  10 mg Oral q morning - 10a  . colchicine  0.6 mg Oral Daily  . docusate sodium  100 mg Oral BID  . guaiFENesin  600 mg Oral BID  . metoprolol tartrate  25 mg Oral BID  . sodium chloride  3 mL Intravenous Q12H  . tiotropium  18 mcg Inhalation Daily   . sodium chloride 10 mL/hr (10/29/12 1830)  . pantoprozole (PROTONIX) infusion 8 mg/hr  (10/30/12 0215)    Physical Exam:   Filed Vitals:   10/29/12 2256 10/30/12 0459 10/30/12 0735 10/30/12 0811  BP: 156/73 143/74 139/67   Pulse: 84 83 77   Temp: 97.7 F (36.5 C) 98.2 F (36.8 C)  97.8 F (36.6 C)  TempSrc: Oral Oral  Oral  Resp: 17 18 22    Height:      Weight:      SpO2: 95% 97% 98%     Affect appropriate Chronically ill pale male HEENT: normal Neck supple with no adenopathy JVP normal no bruits no thyromegaly Lungs Rhonchi no  wheezing and good diaphragmatic motion Heart:  S1/S2 no murmur, no rub, gallop or click PMI normal Abdomen: benighn, BS positve, no tenderness, no AAA no bruit.  No HSM or HJR Distal pulses intact with no bruits No edema Neuro non-focal Skin warm and dry No muscular weakness   Labs:   Lab Results  Component Value Date   WBC 10.2 10/30/2012   HGB 8.8* 10/30/2012   HCT 27.9* 10/30/2012   MCV 82.1 10/30/2012   PLT 173 10/30/2012    Recent Labs Lab 10/29/12 0920 10/30/12 0440  NA 140 137  K 4.1 4.4  CL 107 105  CO2 23 24  BUN 24* 25*  CREATININE 1.58* 1.52*  CALCIUM 8.6 9.0  PROT 5.8*  --   BILITOT 0.4  --   ALKPHOS 62  --   ALT 8  --   AST 12  --   GLUCOSE 114* 108*   Lab Results  Component Value Date   TROPONINI 1.30* 10/29/2012    Lab Results  Component Value Date   CHOL 140 04/04/2012   CHOL 104 11/11/2011   Lab Results  Component Value Date   HDL 25* 04/04/2012   Lab Results  Component Value Date   LDLCALC 78 04/04/2012   Lab Results  Component Value Date   TRIG 183* 04/04/2012   TRIG 159* 11/11/2011   Lab Results  Component Value Date   CHOLHDL 5.6 04/04/2012   No results found for this basename: LDLDIRECT      Radiology: Dg Chest Port 1 View  10/28/2012   *RADIOLOGY REPORT*  Clinical Data: Chest pain shortness of breath with cough.  COPD.  PORTABLE CHEST - 1 VIEW  Comparison: 04/29/2012 chest radiograph  Findings: There are changes of median sternotomy for CABG.  The heart size appears  stable given differences in portable technique.  10 mm nodular density projects over the posterior fourth rib. Density is not seen on prior chest radiograph.  Pulmonary nodule cannot be excluded.  There is peribronchial thickening at the left lung base.  Small opacity at the lateral left lung base could be atelectasis or mild airspace disease.  Linear density in the left midlung is stable and could be an area of focal scarring.  Pleural calcification not excluded.  Calcification in the subacromial space on the right suggests the possibility of calcific tendonitis.  IMPRESSION:  1.  Cannot exclude a 10 mm nodule in the right upper lung laterally.  Chest CT could be considered for further evaluation. If chest CT is performed for the primary purpose of evaluatiing for a lung nodule, it could be performed without intravenous contrast. 2.  Peribronchial thickening at the left lung base with small opacity.  Left basilar bronchitis / pneumonia cannot be excluded. 3.  Question small pleural calcification versus area of scarring in the left mid lung.   Original Report Authenticated By: Britta Mccreedy, M.D.   Dg Abd 2 Views  10/29/2012   *RADIOLOGY REPORT*  Clinical Data: Abdominal pain with nausea vomiting.  Blood in stool for 7-10 days.  ABDOMEN - 2 VIEW  Comparison: 11/07/2011  Findings: Normal bowel gas pattern.  No evidence of obstruction or generalized adynamic ileus.  No free air.  There are bilateral common iliac artery stents.  Aortoiliac vascular calcifications are noted.  Small densities superimposed over the right renal shadow are also likely vascular calcifications.  The soft tissues are otherwise unremarkable.  IMPRESSION: No acute findings.  No obstruction or free air.   Original Report Authenticated By: Amie Portland, M.D.    EKG:  SR old IPMI no acute changes   ASSESSMENT AND PLAN:  CAD:  Continue beta blocker. Transfuse to Hb of 10.  Cycle enzymes including CPK.  Follow ECG  He is asymptomatic now and  has been prepped Important to find source of bleeding Proceed with colonoscopy I would be as concerned with COPD and pulmonary status and would go slow with sedation consider using one agent either versed or fentanyl with the former being more easily reversed with romazacon CRF: stabel around 1.5-16.  EF is 50-55% so can transfuse and hydrate without lasix COPD oxygen as needed abnormal CXR on admission needs outpatient f/u for nodule and continued smoking with Dr Valentina Lucks  Dr Armanda Magic to round on in am  Signed: Charlton Haws 10/30/2012, 9:12 AM

## 2012-10-30 NOTE — Progress Notes (Signed)
Pt. Was encouraged to drink bowel prep until 10 am as instructed by MD; pt. Only drank 8 more oz.  Will Continue to monitor.  Vivi Martens RN

## 2012-10-31 LAB — BASIC METABOLIC PANEL
Calcium: 8.7 mg/dL (ref 8.4–10.5)
Creatinine, Ser: 1.67 mg/dL — ABNORMAL HIGH (ref 0.50–1.35)
GFR calc non Af Amer: 39 mL/min — ABNORMAL LOW (ref >90)
Sodium: 137 mEq/L (ref 135–145)

## 2012-10-31 LAB — TYPE AND SCREEN
ABO/RH(D): O POS
Antibody Screen: NEGATIVE
Unit division: 0
Unit division: 0

## 2012-10-31 LAB — CBC
MCH: 26.4 pg (ref 26.0–34.0)
MCHC: 31.7 g/dL (ref 30.0–36.0)
Platelets: 186 10*3/uL (ref 150–400)

## 2012-10-31 MED ORDER — DIPHENHYDRAMINE HCL 25 MG PO CAPS
25.0000 mg | ORAL_CAPSULE | Freq: Four times a day (QID) | ORAL | Status: DC | PRN
  Administered 2012-10-31: 25 mg via ORAL
  Filled 2012-10-31: qty 1

## 2012-10-31 NOTE — Progress Notes (Signed)
Patient transferred to 4708 from 80. Patient alert and oriented and currently complains of no pain at this time. Oriented patient to equipment in room. Currently patient wants to take a nap and therefore will let him rest. Will continue to monitor to end of shift.

## 2012-10-31 NOTE — Progress Notes (Signed)
Report called to Rasheda on [id number].  Patient ready to transfer to [phone]

## 2012-10-31 NOTE — Progress Notes (Addendum)
Patient ID: Nathan [name], male   DOB: 16-May-1940, 72 y.o.   MRN: [id number] [facility] Progress Note  Nathan [name] 72 y.o. May 20, 1940   Subjective: Bloody stools overnight. RBC scan negative. Feels great this morning. Ate solid food this AM. Reading newspaper. No complaints.  Objective: Vital signs in last 24 hours: Filed Vitals:   10/31/12 0724  BP: 147/75  Pulse: 81  Temp: 98.2 F (36.8 C)  Resp: 21    Physical Exam: Gen: alert, no acute distress, elderly, well-nourished Abd: right-sided tenderness with guarding, soft, nondistended, +BS  Lab Results:  Recent Labs  10/29/12 0920 10/30/12 0440 10/31/12 0432  NA 140 137 137  K 4.1 4.4 4.1  CL 107 105 106  CO2 23 24 22   GLUCOSE 114* 108* 99  BUN 24* 25* 27*  CREATININE 1.58* 1.52* 1.67*  CALCIUM 8.6 9.0 8.7  MG 1.9  --   --   PHOS 2.8  --   --     Recent Labs  10/28/12 1726 10/29/12 0920  AST 10 12  ALT 8 8  ALKPHOS 60 62  BILITOT 0.2* 0.4  PROT 5.7* 5.8*  ALBUMIN 3.1* 3.1*    Recent Labs  10/30/12 0440 10/31/12 0432  WBC 10.2 9.3  HGB 8.8* 10.2*  HCT 27.9* 32.2*  MCV 82.1 83.4  PLT 173 186    Recent Labs  10/28/12 1726  LABPROT 13.6  INR 1.05      Assessment/Plan: S/P lower GI bleed likely due to a diverticular source that has resolved. Hgb 10.2 (previously Hgb 8.8 s/p transfusion). RBC scan negative. Suspect abdominal pain due to residual blood in colon that has not passed yet. Bloody stools overnight likely due to large amount of residual blood seen during colonoscopy. Agree with transfer to floor. Home tomorrow from GI standpoint if ok.   [name] 10/31/2012, 10:49 AM

## 2012-10-31 NOTE — Progress Notes (Signed)
Assessment/Plan: Principal Problem:   Lower GI bleed - seems to have stopped. Hb is stable. Presumed AVM vs diverticular as no ischemia seen. No blood above ileocecal valve. RBC scan negative. Will advance diet and transfer to telemetry Active Problems:   Elevated troponin - stable. No further problems with chest pain.    Hypertension   CAD (coronary artery disease)   COPD (chronic obstructive pulmonary disease), gold stage C.   Atrial fibrillation with controlled ventricular response   Chronic kidney disease (CKD), stage III (moderate)   Chronic diastolic CHF (congestive heart failure)   Tobacco use disorder   Subjective: Doing well overnight. Appreciate GI and cardiology help. No chest pain, dyspnea. No overt bleeding.   Objective:  Vital Signs: Filed Vitals:   10/31/12 0300 10/31/12 0400 10/31/12 0430 10/31/12 0724  BP:      Pulse: 77 79    Temp:   98.1 F (36.7 C) 98.2 F (36.8 C)  TempSrc:   Oral Oral  Resp: 22 20    Height:      Weight:      SpO2: 97% 95%       EXAM: alert and comfortable   Intake/Output Summary (Last 24 hours) at 10/31/12 0818 Last data filed at 10/31/12 0400  Gross per 24 hour  Intake   1073 ml  Output      2 ml  Net   1071 ml    Lab Results:  Recent Labs  10/29/12 0920 10/30/12 0440 10/31/12 0432  NA 140 137 137  K 4.1 4.4 4.1  CL 107 105 106  CO2 23 24 22   GLUCOSE 114* 108* 99  BUN 24* 25* 27*  CREATININE 1.58* 1.52* 1.67*  CALCIUM 8.6 9.0 8.7  MG 1.9  --   --   PHOS 2.8  --   --     Recent Labs  10/28/12 1726 10/29/12 0920  AST 10 12  ALT 8 8  ALKPHOS 60 62  BILITOT 0.2* 0.4  PROT 5.7* 5.8*  ALBUMIN 3.1* 3.1*    Recent Labs  10/28/12 1726  LIPASE 24    Recent Labs  10/30/12 0440 10/31/12 0432  WBC 10.2 9.3  HGB 8.8* 10.2*  HCT 27.9* 32.2*  MCV 82.1 83.4  PLT 173 186    Recent Labs  10/29/12 0920 10/29/12 1215 10/30/12 0950  CKTOTAL  --   --  120  CKMB  --   --  8.4*  TROPONINI 0.99* 1.30*  1.19*   No components found with this basename: POCBNP,  No results found for this basename: DDIMER,  in the last 72 hours No results found for this basename: HGBA1C,  in the last 72 hours No results found for this basename: CHOL, HDL, LDLCALC, TRIG, CHOLHDL, LDLDIRECT,  in the last 72 hours  Recent Labs  10/29/12 0920  TSH 1.837   No results found for this basename: VITAMINB12, FOLATE, FERRITIN, TIBC, IRON, RETICCTPCT,  in the last 72 hours  Studies/Results: Nm Gi Blood Loss  10/30/2012   *RADIOLOGY REPORT*  Clinical Data: GI bleed.  NUCLEAR MEDICINE GASTROINTESTINAL BLEEDING STUDY  Technique:  Sequential abdominal images were obtained following intravenous administration of Tc-59m labeled red blood cells.  Radiopharmaceutical: CURIE ULTRATAG TECHNETIUM TC 63M- LABELED RED BLOOD CELLS IV KIT  Comparison: None.  Findings: No active GI bleed is identified over a 95-minute period.  IMPRESSION: Negative GI bleeding study.   Original Report Authenticated By: Rudie Meyer, M.D.   Dg Abd 2 Views  10/29/2012   *RADIOLOGY REPORT*  Clinical Data: Abdominal pain with nausea vomiting.  Blood in stool for 7-10 days.  ABDOMEN - 2 VIEW  Comparison: 11/07/2011  Findings: Normal bowel gas pattern.  No evidence of obstruction or generalized adynamic ileus.  No free air.  There are bilateral common iliac artery stents.  Aortoiliac vascular calcifications are noted.  Small densities superimposed over the right renal shadow are also likely vascular calcifications.  The soft tissues are otherwise unremarkable.  IMPRESSION: No acute findings.  No obstruction or free air.   Original Report Authenticated By: Amie Portland, M.D.   Medications: Medications administered in the last 24 hours reviewed.  Current Medication List reviewed.    LOS: 3 days   [facility] @ Patsi Sears [phone]) 10/31/2012, 8:18 AM

## 2012-10-31 NOTE — Progress Notes (Signed)
SUBJECTIVE:  No further chest pain  OBJECTIVE:   Vitals:   Filed Vitals:   10/31/12 0400 10/31/12 0430 10/31/12 0724 10/31/12 0900  BP:   147/75   Pulse: 79  81   Temp:  98.1 F (36.7 C) 98.2 F (36.8 C)   TempSrc:  Oral Oral   Resp: 20  21   Height:      Weight:      SpO2: 95%  99% 98%   I&O's:   Intake/Output Summary (Last 24 hours) at 10/31/12 0916 Last data filed at 10/31/12 0400  Gross per 24 hour  Intake   1073 ml  Output      2 ml  Net   1071 ml   TELEMETRY: Reviewed telemetry pt in NSR:     PHYSICAL EXAM General: Well developed, well nourished, in no acute distress Head: Eyes PERRLA, No xanthomas.   Normal cephalic and atramatic  Lungs:   Clear bilaterally to auscultation and percussion. Heart:   HRRR S1 S2 Pulses are 2+ & equal. Abdomen: Bowel sounds are positive, abdomen soft and non-tender without masses Extremities:   No clubbing, cyanosis or edema.  DP +1 Neuro: Alert and oriented X 3. Psych:  Good affect, responds appropriately   LABS: Basic Metabolic Panel:  Recent Labs  08/65/78 0920 10/30/12 0440 10/31/12 0432  NA 140 137 137  K 4.1 4.4 4.1  CL 107 105 106  CO2 23 24 22   GLUCOSE 114* 108* 99  BUN 24* 25* 27*  CREATININE 1.58* 1.52* 1.67*  CALCIUM 8.6 9.0 8.7  MG 1.9  --   --   PHOS 2.8  --   --    Liver Function Tests:  Recent Labs  10/28/12 1726 10/29/12 0920  AST 10 12  ALT 8 8  ALKPHOS 60 62  BILITOT 0.2* 0.4  PROT 5.7* 5.8*  ALBUMIN 3.1* 3.1*    Recent Labs  10/28/12 1726  LIPASE 24   CBC:  Recent Labs  10/30/12 0440 10/31/12 0432  WBC 10.2 9.3  HGB 8.8* 10.2*  HCT 27.9* 32.2*  MCV 82.1 83.4  PLT 173 186   Cardiac Enzymes:  Recent Labs  10/29/12 0920 10/29/12 1215 10/30/12 0950  CKTOTAL  --   --  120  CKMB  --   --  8.4*  TROPONINI 0.99* 1.30* 1.19*   Thyroid Function Tests:  Recent Labs  10/29/12 0920  TSH 1.837   Anemia Panel: No results found for this basename: VITAMINB12, FOLATE,  FERRITIN, TIBC, IRON, RETICCTPCT,  in the last 72 hours Coag Panel:   Lab Results  Component Value Date   INR 1.05 10/28/2012   INR 0.99 04/03/2012   INR 1.38 11/03/2011    RADIOLOGY: Nm Gi Blood Loss  10/30/2012   *RADIOLOGY REPORT*  Clinical Data: GI bleed.  NUCLEAR MEDICINE GASTROINTESTINAL BLEEDING STUDY  Technique:  Sequential abdominal images were obtained following intravenous administration of Tc-81m labeled red blood cells.  Radiopharmaceutical: CURIE ULTRATAG TECHNETIUM TC 38M- LABELED RED BLOOD CELLS IV KIT  Comparison: None.  Findings: No active GI bleed is identified over a 95-minute period.  IMPRESSION: Negative GI bleeding study.   Original Report Authenticated By: Rudie Meyer, M.D.   Dg Chest Port 1 View  10/28/2012   *RADIOLOGY REPORT*  Clinical Data: Chest pain shortness of breath with cough.  COPD.  PORTABLE CHEST - 1 VIEW  Comparison: 04/29/2012 chest radiograph  Findings: There are changes of median sternotomy for CABG.  The heart  size appears stable given differences in portable technique.  10 mm nodular density projects over the posterior fourth rib. Density is not seen on prior chest radiograph.  Pulmonary nodule cannot be excluded.  There is peribronchial thickening at the left lung base.  Small opacity at the lateral left lung base could be atelectasis or mild airspace disease.  Linear density in the left midlung is stable and could be an area of focal scarring.  Pleural calcification not excluded.  Calcification in the subacromial space on the right suggests the possibility of calcific tendonitis.  IMPRESSION:  1.  Cannot exclude a 10 mm nodule in the right upper lung laterally.  Chest CT could be considered for further evaluation. If chest CT is performed for the primary purpose of evaluatiing for a lung nodule, it could be performed without intravenous contrast. 2.  Peribronchial thickening at the left lung base with small opacity.  Left basilar bronchitis / pneumonia  cannot be excluded. 3.  Question small pleural calcification versus area of scarring in the left mid lung.   Original Report Authenticated By: Britta Mccreedy, M.D.   Dg Abd 2 Views  10/29/2012   *RADIOLOGY REPORT*  Clinical Data: Abdominal pain with nausea vomiting.  Blood in stool for 7-10 days.  ABDOMEN - 2 VIEW  Comparison: 11/07/2011  Findings: Normal bowel gas pattern.  No evidence of obstruction or generalized adynamic ileus.  No free air.  There are bilateral common iliac artery stents.  Aortoiliac vascular calcifications are noted.  Small densities superimposed over the right renal shadow are also likely vascular calcifications.  The soft tissues are otherwise unremarkable.  IMPRESSION: No acute findings.  No obstruction or free air.   Original Report Authenticated By: Amie Portland, M.D.   ASSESSMENT and PLAN:  Principal Problem:  Lower GI bleed - presumed AVM vs. Diverticular bleed Active Problems:  Hypertension  CAD (coronary artery disease)  COPD (chronic obstructive pulmonary disease), gold stage C.  Atrial fibrillation with controlled ventricular response  Chronic kidney disease (CKD), stage III (moderate)  Chronic diastolic CHF (congestive heart failure)  Tobacco use disorder  Elevated troponin - c/w demand ischemia from GI bleed - has had some chest burning over the past few weeks and yesterday but does not remember if it is like what his typical angina was in the past  -continue beta blocker/statin - cardiac status stable with no further chest pain.  ASA on hold -Hbg stable after transfusion  Quintella Reichert, MD  10/31/2012  9:16 AM

## 2012-10-31 NOTE — Progress Notes (Signed)
Patient requesting something for itching for his psoriasis is causing him to itch. Patient states that he takes benadryl daily at night (2) pills at home. Paged attending doctor and awaiting for doctor to return page. Currently it is at change of shift and if doctor not able to call back in time will report off to oncoming nurse to re-page the doctor.  Will continue to monitor patient in the meantime.

## 2012-11-01 LAB — BASIC METABOLIC PANEL
BUN: 27 mg/dL — ABNORMAL HIGH (ref 6–23)
CO2: 23 mEq/L (ref 19–32)
Calcium: 8.5 mg/dL (ref 8.4–10.5)
Chloride: 109 mEq/L (ref 96–112)
Creatinine, Ser: 1.7 mg/dL — ABNORMAL HIGH (ref 0.50–1.35)
GFR calc Af Amer: 44 mL/min — ABNORMAL LOW (ref >90)
GFR calc non Af Amer: 38 mL/min — ABNORMAL LOW (ref >90)
Glucose, Bld: 101 mg/dL — ABNORMAL HIGH (ref 70–99)
Potassium: 4.3 mEq/L (ref 3.5–5.1)
Sodium: 140 mEq/L (ref 135–145)

## 2012-11-01 LAB — CBC
HCT: 31.5 % — ABNORMAL LOW (ref 39.0–52.0)
Hemoglobin: 10.1 g/dL — ABNORMAL LOW (ref 13.0–17.0)
MCH: 26.6 pg (ref 26.0–34.0)
MCHC: 32.1 g/dL (ref 30.0–36.0)
MCV: 83.1 fL (ref 78.0–100.0)
Platelets: 177 10*3/uL (ref 150–400)
RBC: 3.79 MIL/uL — ABNORMAL LOW (ref 4.22–5.81)
RDW: 17.3 % — ABNORMAL HIGH (ref 11.5–15.5)
WBC: 9.3 10*3/uL (ref 4.0–10.5)

## 2012-11-01 MED ORDER — METOPROLOL TARTRATE 25 MG PO TABS: 25.0000 mg | ORAL_TABLET | Freq: Two times a day (BID) | ORAL | Status: DC

## 2012-11-01 NOTE — Progress Notes (Signed)
I have reviewed all cardiac data. Agree with assessment, elevated troponin due to stress of anemia in setting of diffuse coronary disease. No acute EKG changes but nonspecific ST segment abnormality initially. With no recurrence of chest pain, no further workup is necessary. We will follow closely with you as an outpatient, and if recurrent angina, perhaps an ischemic evaluation should be considered with either cath or a nuclear.

## 2012-11-01 NOTE — Discharge Summary (Signed)
Physician Discharge Summary   Patient ID: Nathan [name] [id number] 71 y.o. Jan 17, 1941  Admit date: 10/28/2012  Discharge date and time: 11/01/2012  Admitting Physician: Therisa Doyne, MD   Discharge Physician: Thora Lance, M.D.  Admission Diagnoses: CAD (coronary artery disease) [414.00] GI bleed [578.9] Chest pain [786.50] Anemia, blood loss  Discharge Diagnoses: Lower GI bleed Coronary artery disease Chest pain Anemia, blood loss  Admission Condition: fair  Discharged Condition: good  Indication for Admission: Lower GI bleeding  Hospital Course: Patient was admitted on 10/28/2012 with 7-8 days of blood in his stool. He had the first episode was fairly heavy about 7 days ago and then had lightened up considerably, had been referred to gastroenterology but on the day of his appointment had had increased bleeding again and was sent emergent department. He has some nausea and vomiting which was nonbloody and a one hour episode of chest pain in the emergency room. Hemoglobin was down from 10.1-8.8. At admission BUN 27 creatinine 1.82 and hemoglobin was 8.8. The patient was admitted and started on a PPI IV drip and given 1 unit PRBC. His aspirin was held. His hemoglobin came up to just about 10 and had stabilized. The patient was seen by gastroenterology Dr. Bosie Clos. A colonoscopy was performed on June 21 which showed blood in the colon but no definite source identified, diverticular versus AVM suspected. A large amount of colonic because was not visualized because of blood products The patient did continue to have some bloody stool and they RBC nuclear scan was done and was negative for bleeding site. The patient had no significant bleeding in last 24 hours of his admission, hemoglobin pending at this dictation. The patient did have a bump in his troponin with a level of 1.30. He was seen by Dr. Mayford Knife cardiology who felt this was consistent with demand ischemia from the GI  bleed. He was  tachycardic a second hospital day and a beta blocker was started. Statin continued. Aspirin was held. The patient was not having any more chest pain at discharge.   Consults: cardiology and GI  Significant Diagnostic Studies: labs: June 22 CBC hemoglobin 10.2, WC 9.3, platelets 186 nuclear medicine: RBC nuclear scan as above and endoscopy: colonoscopy: As above  Treatments: IV hydration, cardiac meds: metoprolol and procedures: Colonoscopy  Discharge Exam: Lungs clear, abdomen soft and nontender, heart regular in rhythm  Disposition: 01-Home or Self Care  Patient Instructions:    Medication List    STOP taking these medications       aspirin 81 MG chewable tablet      TAKE these medications       acetaminophen 325 MG tablet  Commonly known as:  TYLENOL  Take 650 mg by mouth at bedtime as needed for pain. For pain     atorvastatin 10 MG tablet  Commonly known as:  LIPITOR  Take 10 mg by mouth every morning.     COLCRYS 0.6 MG tablet  Generic drug:  colchicine  Take 0.6 mg by mouth daily.     CULTURELLE DIGESTIVE HEALTH Caps  Take 1 capsule by mouth daily.     diphenhydrAMINE 25 MG tablet  Commonly known as:  BENADRYL  Take 25 mg by mouth at bedtime as needed for itching or sleep.     eplerenone 25 MG tablet  Commonly known as:  INSPRA  Take 12.5 mg by mouth daily.     furosemide 80 MG tablet  Commonly known as:  LASIX  Take 40 mg by mouth daily.     Melatonin 3 MG Tabs  Take 3 tablets by mouth at bedtime.     metoprolol tartrate 25 MG tablet  Commonly known as:  LOPRESSOR  Take 1 tablet (25 mg total) by mouth 2 (two) times daily.     nitroGLYCERIN 0.4 MG SL tablet  Commonly known as:  NITROSTAT  Place 0.4 mg under the tongue every 5 (five) minutes as needed for chest pain.     tiotropium 18 MCG inhalation capsule  Commonly known as:  SPIRIVA  Place 18 mcg into inhaler and inhale daily.       Activity: activity as tolerated Diet:  cardiac diet Wound Care: none needed  Follow-up with Dr. Kirby Funk in one week  Signed: Lillia Mountain 11/01/2012 6:16 AM

## 2012-11-01 NOTE — Progress Notes (Signed)
Order obtained for benadryl and given. Patient resting quietly

## 2012-11-01 NOTE — Progress Notes (Signed)
Nathan [name] to be D/C'd Home per MD order.  Discussed with the patient and all questions fully answered.    Medication List    STOP taking these medications       aspirin 81 MG chewable tablet      TAKE these medications       acetaminophen 325 MG tablet  Commonly known as:  TYLENOL  Take 650 mg by mouth at bedtime as needed for pain. For pain     atorvastatin 10 MG tablet  Commonly known as:  LIPITOR  Take 10 mg by mouth every morning.     COLCRYS 0.6 MG tablet  Generic drug:  colchicine  Take 0.6 mg by mouth daily.     CULTURELLE DIGESTIVE HEALTH Caps  Take 1 capsule by mouth daily.     diphenhydrAMINE 25 MG tablet  Commonly known as:  BENADRYL  Take 25 mg by mouth at bedtime as needed for itching or sleep.     eplerenone 25 MG tablet  Commonly known as:  INSPRA  Take 12.5 mg by mouth daily.     furosemide 80 MG tablet  Commonly known as:  LASIX  Take 40 mg by mouth daily.     Melatonin 3 MG Tabs  Take 3 tablets by mouth at bedtime.     metoprolol tartrate 25 MG tablet  Commonly known as:  LOPRESSOR  Take 1 tablet (25 mg total) by mouth 2 (two) times daily.     nitroGLYCERIN 0.4 MG SL tablet  Commonly known as:  NITROSTAT  Place 0.4 mg under the tongue every 5 (five) minutes as needed for chest pain.     tiotropium 18 MCG inhalation capsule  Commonly known as:  SPIRIVA  Place 18 mcg into inhaler and inhale daily.        VVS, Skin clean, dry and intact without evidence of skin break down, no evidence of skin tears noted. IV catheter discontinued intact. Site without signs and symptoms of complications. Dressing and pressure applied.  An After Visit Summary was printed and given to the patient. Patient escorted via WC, and D/C home via private auto.  Nathan [name] 11/01/2012 7:59 PM

## 2012-11-02 ENCOUNTER — Encounter (HOSPITAL_COMMUNITY): Payer: Self-pay | Admitting: Gastroenterology

## 2013-01-24 ENCOUNTER — Other Ambulatory Visit (HOSPITAL_COMMUNITY): Payer: Self-pay | Admitting: Adult Health

## 2013-03-23 ENCOUNTER — Encounter: Payer: Self-pay | Admitting: Interventional Cardiology

## 2013-03-23 ENCOUNTER — Ambulatory Visit (INDEPENDENT_AMBULATORY_CARE_PROVIDER_SITE_OTHER): Payer: Medicare PPO | Admitting: Interventional Cardiology

## 2013-03-23 VITALS — BP 116/68 | HR 67 | Ht 73.0 in | Wt 177.0 lb

## 2013-03-23 DIAGNOSIS — I340 Nonrheumatic mitral (valve) insufficiency: Secondary | ICD-10-CM

## 2013-03-23 DIAGNOSIS — J449 Chronic obstructive pulmonary disease, unspecified: Secondary | ICD-10-CM

## 2013-03-23 DIAGNOSIS — I5032 Chronic diastolic (congestive) heart failure: Secondary | ICD-10-CM

## 2013-03-23 DIAGNOSIS — K922 Gastrointestinal hemorrhage, unspecified: Secondary | ICD-10-CM

## 2013-03-23 DIAGNOSIS — Z951 Presence of aortocoronary bypass graft: Secondary | ICD-10-CM

## 2013-03-23 DIAGNOSIS — I2510 Atherosclerotic heart disease of native coronary artery without angina pectoris: Secondary | ICD-10-CM

## 2013-03-23 DIAGNOSIS — I059 Rheumatic mitral valve disease, unspecified: Secondary | ICD-10-CM

## 2013-03-23 DIAGNOSIS — I509 Heart failure, unspecified: Secondary | ICD-10-CM

## 2013-03-23 DIAGNOSIS — I10 Essential (primary) hypertension: Secondary | ICD-10-CM

## 2013-03-23 NOTE — Progress Notes (Signed)
Patient ID: [name], male   DOB: 1941-03-18, 72 y.o.   MRN: [id number]    [address] [place], [place]  [address] Phone: [phone] Fax:  [phone]  Date:  03/23/2013   ID:  Nathan [name], DOB [birth year], MRN [id number]  PCP:  Lillia Mountain, MD   ASSESSMENT:  1. Coronary atherosclerosis without angina 2. Chronic diastolic heart failure, mildly decompensated with Foley clinical feature being increasing cough with recumbency 3. Peripheral vascular disease, asymptomatic 4. Depression, being managed by Dr. Valentina Lucks  PLAN:  1. Increase Lasix to 60 mg daily for up to 4 days to rid the patient of the, cough. If it does not improve after several days of increased furosemide, or if there is change in color, he should speak with Dr. Valentina Lucks and be considered for antibiotic therapy. 2. Otherwise no change in cardiac therapy with instructions to return to see me in one year.   SUBJECTIVE: Nathan [name] is a 72 y.o. male who is coughing when he lays down but also while sitting. White frothy phlegm this produced. He denies chills or fever. No significant dyspnea on exertion. No significant lower extremity edema. He denies angina. He has not had syncope. He has been depressed and is undergoing medication changes by Dr. Valentina Lucks to help solve this problem. He is looking forward to traveling to dialysis see the child was playing [place] at Thanksgiving.   Wt Readings from Last 3 Encounters:  03/23/13 177 lb (80.287 kg)  11/01/12 194 lb 9.6 oz (88.27 kg)  11/01/12 194 lb 9.6 oz (88.27 kg)     Past Medical History  Diagnosis Date  . Aorto-iliac disease     bilat with stent 2007  . CAD (coronary artery disease)     with loop to the circumflex 2000, inferolateral infarction  . Hypertension   . ED (erectile dysfunction)   . COPD (chronic obstructive pulmonary disease)   . Hyperlipidemia   . Stroke   . Shortness of breath   . GERD (gastroesophageal reflux  disease)   . Gout   . Paroxysmal atrial flutter     ablation    Current Outpatient Prescriptions  Medication Sig Dispense Refill  . acetaminophen (TYLENOL) 325 MG tablet Take 650 mg by mouth daily. For pain      . atorvastatin (LIPITOR) 10 MG tablet Take 10 mg by mouth every morning.      Marland Kitchen buPROPion (WELLBUTRIN XL) 300 MG 24 hr tablet Take 300 mg by mouth daily.      . colchicine (COLCRYS) 0.6 MG tablet Take 0.6 mg by mouth daily.      Marland Kitchen eplerenone (INSPRA) 25 MG tablet TAKE 1/2 TABLET BY MOUTH DAILY  15 tablet  6  . furosemide (LASIX) 40 MG tablet Take 40 mg by mouth.      . Lactobacillus-Inulin (CULTURELLE DIGESTIVE HEALTH) CAPS Take 1 capsule by mouth daily.      . Melatonin 3 MG TABS Take 3 tablets by mouth at bedtime.       . metoprolol tartrate (LOPRESSOR) 25 MG tablet Take 1 tablet (25 mg total) by mouth 2 (two) times daily.  60 tablet  11  . nitroGLYCERIN (NITROSTAT) 0.4 MG SL tablet Place 0.4 mg under the tongue every 5 (five) minutes as needed for chest pain.      Marland Kitchen PROAIR HFA 108 (90 BASE) MCG/ACT inhaler USE AS DIRECTED ONCE DAILY      . tiotropium (SPIRIVA) 18 MCG  inhalation capsule Place 18 mcg into inhaler and inhale daily.       No current facility-administered medications for this visit.    Allergies:   No Known Allergies  Social History:    History   Social History  . Marital Status: Married    Spouse Name: N/A    Number of Children: N/A  . Years of Education: N/A   Occupational History  . retired    Social History Main Topics  . Smoking status: Current Some Day Smoker -- 0.50 packs/day for 64 years    Types: Cigarettes  . Smokeless tobacco: Never Used  . 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 Drug Use: Yes    Special: Marijuana  . Sexual Activity: Not on file   Other Topics Concern  . Not on file   Social History Narrative  . No  narrative on file    ROS:  Please see the history of present illness.   Decreased appetite with weight loss. File mood most of the time according to his wife.   All other systems reviewed and negative.   OBJECTIVE: VS:  BP 116/68  Pulse 67  Ht 6\' 1"  (1.854 m)  Wt 177 lb (80.287 kg)  BMI 23.36 kg/m2  SpO2 96% Well nourished, well developed, in no acute distress, healthy appearing with good skin color HEENT: normal Neck: JVD flat while lying at 45 and also while sitting. Carotid bruit bilateral with right greater than left bruit, chronic  Cardiac:  normal S1, S2; RRR; 2 of 6 systolic murmur left lower sternal border and radiating into the left axilla murmur Lungs:  clear to auscultation bilaterally, no wheezing, rhonchi or rales Abd: soft, nontender, no hepatomegaly Ext: Edema absent. Pulses trace to 1+ bilateral Skin: warm and dry Neuro:  CNs 2-12 intact, no focal abnormalities noted  EKG:  Not repeated       Signed, Darci Needle III, MD 03/23/2013 3:45 PM

## 2013-03-23 NOTE — Patient Instructions (Signed)
Increase Lasix to 60mg  1 1/2 tablet daily for no more than 4 days. Then resume normal dosage of 40mg  daily.  Your physician wants you to follow-up in: 1 year You will receive a reminder letter in the mail two months in advance. If you don't receive a letter, please call our office to schedule the follow-up appointment.

## 2013-03-29 ENCOUNTER — Other Ambulatory Visit: Payer: Self-pay | Admitting: Surgery

## 2013-03-29 DIAGNOSIS — Z48812 Encounter for surgical aftercare following surgery on the circulatory system: Secondary | ICD-10-CM

## 2013-04-20 ENCOUNTER — Ambulatory Visit (HOSPITAL_COMMUNITY): Payer: Medicare PPO | Attending: Internal Medicine

## 2013-05-30 ENCOUNTER — Encounter: Payer: Self-pay | Admitting: Family

## 2013-05-31 ENCOUNTER — Other Ambulatory Visit (HOSPITAL_COMMUNITY): Payer: Medicare PPO

## 2013-05-31 ENCOUNTER — Ambulatory Visit: Payer: Medicare PPO | Admitting: Interventional Cardiology

## 2013-05-31 ENCOUNTER — Ambulatory Visit: Payer: Medicare PPO | Admitting: Family

## 2013-06-29 ENCOUNTER — Encounter: Payer: Self-pay | Admitting: Family

## 2013-06-30 ENCOUNTER — Inpatient Hospital Stay (HOSPITAL_COMMUNITY): Admission: RE | Admit: 2013-06-30 | Payer: Medicare PPO | Source: Ambulatory Visit

## 2013-06-30 ENCOUNTER — Ambulatory Visit: Payer: Medicare PPO | Admitting: Family

## 2013-08-22 ENCOUNTER — Other Ambulatory Visit (HOSPITAL_COMMUNITY): Payer: Self-pay | Admitting: Adult Health

## 2013-08-26 ENCOUNTER — Encounter: Payer: Self-pay | Admitting: Family

## 2013-08-29 ENCOUNTER — Ambulatory Visit: Payer: Medicare PPO | Admitting: Family

## 2013-08-29 ENCOUNTER — Other Ambulatory Visit (HOSPITAL_COMMUNITY): Payer: Medicare PPO

## 2013-09-14 ENCOUNTER — Ambulatory Visit: Payer: Medicare PPO | Admitting: Family

## 2013-09-14 ENCOUNTER — Other Ambulatory Visit (HOSPITAL_COMMUNITY): Payer: Medicare PPO

## 2013-09-22 ENCOUNTER — Encounter: Payer: Self-pay | Admitting: Family

## 2013-09-23 ENCOUNTER — Ambulatory Visit (INDEPENDENT_AMBULATORY_CARE_PROVIDER_SITE_OTHER): Payer: Medicare PPO | Admitting: Family

## 2013-09-23 ENCOUNTER — Encounter: Payer: Self-pay | Admitting: Family

## 2013-09-23 ENCOUNTER — Ambulatory Visit (HOSPITAL_COMMUNITY)
Admission: RE | Admit: 2013-09-23 | Discharge: 2013-09-23 | Disposition: A | Discharge status: Home / Self Care | Payer: Medicare PPO | Source: Ambulatory Visit | Attending: Family | Admitting: Family

## 2013-09-23 VITALS — BP 131/72 | HR 59 | Resp 14 | Ht 72.0 in | Wt 183.0 lb

## 2013-09-23 DIAGNOSIS — Z48812 Encounter for surgical aftercare following surgery on the circulatory system: Secondary | ICD-10-CM

## 2013-09-23 DIAGNOSIS — I6529 Occlusion and stenosis of unspecified carotid artery: Secondary | ICD-10-CM

## 2013-09-23 DIAGNOSIS — I739 Peripheral vascular disease, unspecified: Secondary | ICD-10-CM

## 2013-09-23 NOTE — Patient Instructions (Signed)
Stroke Prevention Some medical conditions and behaviors are associated with an increased chance of having a stroke. You may prevent a stroke by making healthy choices and managing medical conditions. HOW CAN I REDUCE MY RISK OF HAVING A STROKE?   Stay physically active. Get at least 30 minutes of activity on most or all days.  Do not smoke. It may also be helpful to avoid exposure to secondhand smoke.  Limit alcohol use. Moderate alcohol use is considered to be:  No more than 2 drinks per day for men.  No more than 1 drink per day for nonpregnant women.  Eat healthy foods. This involves  Eating 5 or more servings of fruits and vegetables a day.  Following a diet that addresses high blood pressure (hypertension), high cholesterol, diabetes, or obesity.  Manage your cholesterol levels.  A diet low in saturated fat, trans fat, and cholesterol and high in fiber may control cholesterol levels.  Take any prescribed medicines to control cholesterol as directed by your health care provider.  Manage your diabetes.  A controlled-carbohydrate, controlled-sugar diet is recommended to manage diabetes.  Take any prescribed medicines to control diabetes as directed by your health care provider.  Control your hypertension.  A low-salt (sodium), low-saturated fat, low-trans fat, and low-cholesterol diet is recommended to manage hypertension.  Take any prescribed medicines to control hypertension as directed by your health care provider.  Maintain a healthy weight.  A reduced-calorie, low-sodium, low-saturated fat, low-trans fat, low-cholesterol diet is recommended to manage weight.  Stop drug abuse.  Avoid taking birth control pills.  Talk to your health care provider about the risks of taking birth control pills if you are over 35 years old, smoke, get migraines, or have ever had a blood clot.  Get evaluated for sleep disorders (sleep apnea).  Talk to your health care provider about  getting a sleep evaluation if you snore a lot or have excessive sleepiness.  Take medicines as directed by your health care provider.  For some people, aspirin or blood thinners (anticoagulants) are helpful in reducing the risk of forming abnormal blood clots that can lead to stroke. If you have the irregular heart rhythm of atrial fibrillation, you should be on a blood thinner unless there is a good reason you cannot take them.  Understand all your medicine instructions.  Make sure that other other conditions (such as anemia or atherosclerosis) are addressed. SEEK IMMEDIATE MEDICAL CARE IF:   You have sudden weakness or numbness of the face, arm, or leg, especially on one side of the body.  Your face or eyelid droops to one side.  You have sudden confusion.  You have trouble speaking (aphasia) or understanding.  You have sudden trouble seeing in one or both eyes.  You have sudden trouble walking.  You have dizziness.  You have a loss of balance or coordination.  You have a sudden, severe headache with no known cause.  You have new chest pain or an irregular heartbeat. Any of these symptoms may represent a serious problem that is an emergency. Do not wait to see if the symptoms will go away. Get medical help at once. Call your local emergency services  (911 in [place] Do not drive yourself to the hospital. Document Released: 06/05/2004 Document Revised: 02/16/2013 Document Reviewed: 10/29/2012 Exit[employer] Patient Information 2014 [employer]   Smoking Cessation Quitting smoking is important to your health and has many advantages. However, it is not always easy to quit since nicotine is a   very addictive drug. Often times, people try 3 times or more before being able to quit. This document explains the best ways for you to prepare to quit smoking. Quitting takes hard work and a lot of effort, but you can do it. ADVANTAGES OF QUITTING SMOKING  You will live longer, feel better,  and live better.  Your body will feel the impact of quitting smoking almost immediately.  Within 20 minutes, blood pressure decreases. Your pulse returns to its normal level.  After 8 hours, carbon monoxide levels in the blood return to normal. Your oxygen level increases.  After 24 hours, the chance of having a heart attack starts to decrease. Your breath, hair, and body stop smelling like smoke.  After 48 hours, damaged nerve endings begin to recover. Your sense of taste and smell improve.  After 72 hours, the body is virtually free of nicotine. Your bronchial tubes relax and breathing becomes easier.  After 2 to 12 weeks, lungs can hold more air. Exercise becomes easier and circulation improves.  The risk of having a heart attack, stroke, cancer, or lung disease is greatly reduced.  After 1 year, the risk of coronary heart disease is cut in half.  After 5 years, the risk of stroke falls to the same as a nonsmoker.  After 10 years, the risk of lung cancer is cut in half and the risk of other cancers decreases significantly.  After 15 years, the risk of coronary heart disease drops, usually to the level of a nonsmoker.  If you are pregnant, quitting smoking will improve your chances of having a healthy baby.  The people you live with, especially any children, will be healthier.  You will have extra money to spend on things other than cigarettes. QUESTIONS TO THINK ABOUT BEFORE ATTEMPTING TO QUIT You may want to talk about your answers with your caregiver.  Why do you want to quit?  If you tried to quit in the past, what helped and what did not?  What will be the most difficult situations for you after you quit? How will you plan to handle them?  Who can help you through the tough times? Your family? Friends? A caregiver?  What pleasures do you get from smoking? What ways can you still get pleasure if you quit? Here are some questions to ask your caregiver:  How can you  help me to be successful at quitting?  What medicine do you think would be best for me and how should I take it?  What should I do if I need more help?  What is smoking withdrawal like? How can I get information on withdrawal? GET READY  Set a quit date.  Change your environment by getting rid of all cigarettes, ashtrays, matches, and lighters in your home, car, or work. Do not let people smoke in your home.  Review your past attempts to quit. Think about what worked and what did not. GET SUPPORT AND ENCOURAGEMENT You have a better chance of being successful if you have help. You can get support in many ways.  Tell your family, friends, and co-workers that you are going to quit and need their support. Ask them not to smoke around you.  Get individual, group, or telephone counseling and support. Programs are available at local hospitals and health centers. Call your local health department for information about programs in your area.  Spiritual beliefs and practices may help some smokers quit.  Download a "quit meter" on your computer   to keep track of quit statistics, such as how long you have gone without smoking, cigarettes not smoked, and money saved.  Get a self-help book about quitting smoking and staying off of tobacco. LEARN NEW SKILLS AND BEHAVIORS  Distract yourself from urges to smoke. Talk to someone, go for a walk, or occupy your time with a task.  Change your normal routine. Take a different route to work. Drink tea instead of coffee. Eat breakfast in a different place.  Reduce your stress. Take a hot bath, exercise, or read a book.  Plan something enjoyable to do every day. Reward yourself for not smoking.  Explore interactive web-based programs that specialize in helping you quit. GET MEDICINE AND USE IT CORRECTLY Medicines can help you stop smoking and decrease the urge to smoke. Combining medicine with the above behavioral methods and support can greatly increase  your chances of successfully quitting smoking.  Nicotine replacement therapy helps deliver nicotine to your body without the negative effects and risks of smoking. Nicotine replacement therapy includes nicotine gum, lozenges, inhalers, nasal sprays, and skin patches. Some may be available over-the-counter and others require a prescription.  Antidepressant medicine helps people abstain from smoking, but how this works is unknown. This medicine is available by prescription.  Nicotinic receptor partial agonist medicine simulates the effect of nicotine in your brain. This medicine is available by prescription. Ask your caregiver for advice about which medicines to use and how to use them based on your health history. Your caregiver will tell you what side effects to look out for if you choose to be on a medicine or therapy. Carefully read the information on the package. Do not use any other product containing nicotine while using a nicotine replacement product.  RELAPSE OR DIFFICULT SITUATIONS Most relapses occur within the first 3 months after quitting. Do not be discouraged if you start smoking again. Remember, most people try several times before finally quitting. You may have symptoms of withdrawal because your body is used to nicotine. You may crave cigarettes, be irritable, feel very hungry, cough often, get headaches, or have difficulty concentrating. The withdrawal symptoms are only temporary. They are strongest when you first quit, but they will go away within 10 14 days. To reduce the chances of relapse, try to:  Avoid drinking alcohol. Drinking lowers your chances of successfully quitting.  Reduce the amount of caffeine you consume. Once you quit smoking, the amount of caffeine in your body increases and can give you symptoms, such as a rapid heartbeat, sweating, and anxiety.  Avoid smokers because they can make you want to smoke.  Do not let weight gain distract you. Many smokers will gain  weight when they quit, usually less than 10 pounds. Eat a healthy diet and stay active. You can always lose the weight gained after you quit.  Find ways to improve your mood other than smoking. FOR MORE INFORMATION  www.smokefree.gov  Document Released: 04/22/2001 Document Revised: 10/28/2011 Document Reviewed: 08/07/2011 [employer] Patient Information 2014 [employer].  

## 2013-09-23 NOTE — Progress Notes (Signed)
Established Carotid Patient   History of Present Illness  Nathan [name] is a 73 y.o. male patient of Dr. Myra GianottiBrabham.  The patient is back today for followup. He is status post right carotid endarterectomy, performed in conjunction with coronary artery bypass grafting on 10/31/2011. His postoperative course was complicated by a pulmonary issues requiring prolonged intubation. He also had issues with aspiration and hoarseness. He states that he no longer has any difficulty swallowing.  Patient has Negative history of TIA or stroke symptom.  The patient denies amaurosis fugax or monocular blindness.  The patient  denies facial drooping.  Pt. denies hemiplegia.  The patient denies receptive or expressive aphasia.   Pt states some years ago, about 2005,  he had bilateral stents placed for claudication symptoms in upper thighs, with 100% relief. He has had progressive return of claudication pain in both upper thighs after walking about 25 yards, relieved by rest, denies non healing wounds. Pt denies any adverse reaction to ASA, denies GI bleed history, denies nose bleed history, denies unusual bleeding problems, he does not take a daily ASA. Pt denies New Medical or Surgical History.  Pt Diabetic: No Pt smoker: smoker  (1 ppd x 65 yrs); states he has no intention of quitting, declined smoking cessation class information  Pt meds include: Statin : Yes ASA: No Other anticoagulants/antiplatelets: no   Past Medical History  Diagnosis Date  . Aorto-iliac disease     bilat with stent 2007  . CAD (coronary artery disease)     with loop to the circumflex 2000, inferolateral infarction  . Hypertension   . ED (erectile dysfunction)   . COPD (chronic obstructive pulmonary disease)   . Hyperlipidemia   . Stroke   . Shortness of breath   . GERD (gastroesophageal reflux disease)   . Gout   . Paroxysmal atrial flutter     ablation  . Myocardial infarction 2013    Heart Attact    Social  History History  Substance Use Topics  . Smoking status: Current Some Day Smoker -- 0.50 packs/day for 64 years    Types: Cigarettes  . Smokeless tobacco: Never Used  . 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 Cancer Mother   . Tuberculosis Mother   . Heart disease Father     He did NOT have it before age 73    Surgical History Past Surgical History  Procedure Laterality Date  . Spinal fusion    . Hand surgery    . Neck fusion  1975  . Eye surgery    . Coronary artery bypass graft  10/31/2011    Procedure: CORONARY ARTERY BYPASS GRAFTING (CABG);  Surgeon: Delight OvensEdward B Gerhardt, MD;  [facility];  Service: Open Heart Surgery;  Laterality: N/A;  times three using  Left Internal Mammary Artery and Right Greater Saphenous Vein Graft harvested endoscopically; TEE  . Endarterectomy  10/31/2011    Procedure: ENDARTERECTOMY CAROTID;  Surgeon: Nada LibmanVance W Brabham, MD;  [facility];  Service: Vascular;  Laterality: Right;  with patch angioplasty   . Carotid endarterectomy  10-31-2011    right  . Colonoscopy N/A 10/30/2012    Procedure: COLONOSCOPY;  Surgeon: Shirley FriarVincent C. Schooler, MD;  [facility];  Service:  Endoscopy;  Laterality: N/A;    No Known Allergies  Current Outpatient Prescriptions  Medication Sig Dispense Refill  . acetaminophen (TYLENOL) 325 MG tablet Take 650 mg by mouth daily. For pain      . atorvastatin (LIPITOR) 10 MG tablet Take 10 mg by mouth every morning.      Marland Kitchen buPROPion (WELLBUTRIN XL) 300 MG 24 hr tablet Take 300 mg by mouth daily.      Marland Kitchen eplerenone (INSPRA) 25 MG tablet TAKE 1/2 TABLET BY MOUTH DAILY  15 tablet  6  . furosemide (LASIX) 40 MG tablet Take 40 mg by mouth.      . Lactobacillus-Inulin (CULTURELLE DIGESTIVE HEALTH) CAPS Take 1 capsule by mouth daily.      .  Melatonin 3 MG TABS Take 3 tablets by mouth at bedtime.       . metoprolol tartrate (LOPRESSOR) 25 MG tablet Take 1 tablet (25 mg total) by mouth 2 (two) times daily.  60 tablet  11  . nitroGLYCERIN (NITROSTAT) 0.4 MG SL tablet Place 0.4 mg under the tongue every 5 (five) minutes as needed for chest pain.      Marland Kitchen PROAIR HFA 108 (90 BASE) MCG/ACT inhaler USE AS DIRECTED ONCE DAILY      . tiotropium (SPIRIVA) 18 MCG inhalation capsule Place 18 mcg into inhaler and inhale daily.      . colchicine (COLCRYS) 0.6 MG tablet Take 0.6 mg by mouth daily.       No current facility-administered medications for this visit.    Review of Systems : See HPI for pertinent positives and negatives.  Physical Examination  Filed Vitals:   09/23/13 1527 09/23/13 1529  BP: 129/75 131/72  Pulse: 59 59  Resp:  14  Height:  6' (1.829 m)  Weight:  183 lb (83.008 kg)  SpO2:  99%  Body mass index is 24.81 kg/(m^2).   General: WDWN male in NAD GAIT: normal Eyes: PERRLA Pulmonary:  Non-labored, CTAB, Negative  Rales, Negative rhonchi, & Negative wheezing.  Cardiac: regular Rhythm ,  Negative detected murmur.  VASCULAR EXAM Carotid Bruits Left Right   Negative Negative    Aorta is not palpable. Radial pulses are 2+ palpable and equal.                                                                                                                            LE Pulses LEFT RIGHT       FEMORAL  2+ palpable  2+ palpable        POPLITEAL  not palpable   not palpable       POSTERIOR TIBIAL  not palpable   not palpable        DORSALIS PEDIS      ANTERIOR TIBIAL 1+ palpable  not palpable     Gastrointestinal: soft, nontender, BS WNL, no r/g,  negative masses.  Musculoskeletal: Negative muscle atrophy/wasting. M/S 5/5 throughout, Extremities without ischemic changes.  Neurologic:  A&O X 3; Appropriate Affect ; SENSATION ;normal;  Speech is normal CN 2-12 intact, Pain and light touch intact in  extremities, Motor exam as listed above.   Non-Invasive Vascular Imaging CAROTID DUPLEX 09/23/2013   CEREBROVASCULAR DUPLEX EVALUATION    INDICATION: Follow-up carotid disease     PREVIOUS INTERVENTION(S): Right carotid endarterectomy 10/31/2011    DUPLEX EXAM:     RIGHT  LEFT  Peak Systolic Velocities (cm/s) End Diastolic Velocities (cm/s) Plaque LOCATION Peak Systolic Velocities (cm/s) End Diastolic Velocities (cm/s) Plaque  157 20  CCA PROXIMAL 156 20   95 15  CCA MID 114 22   97 23  CCA DISTAL 140 27   164 17  ECA 157 16   90 20  ICA PROXIMAL 82 1 HT  99 29  ICA MID 83 24   101 28  ICA DISTAL 75 22     NA ICA / CCA Ratio (PSV) .6  Antegrade  Vertebral Flow Abnormal Antegrade   126 Brachial Systolic Pressure (mmHg) 132  Within normal limits  Brachial Artery Waveforms Within normal limits     Plaque Morphology:  HM = Homogeneous, HT = Heterogeneous, CP = Calcific Plaque, SP = Smooth Plaque, IP = Irregular Plaque     ADDITIONAL FINDINGS:     IMPRESSION: 1. Patent right carotid endarterectomy with minimal restenosis observed.  2. Evidence of <40% stenosis of the left internal carotid artery. 3. Bilateral vertebral artery is antegrade.    Compared to the previous exam:  No significant change compared to prior exam.      Assessment: Nathan [name] is a 73 y.o. male who is status post right carotid endarterectomy, performed in conjunction with coronary artery bypass grafting on 10/31/2011.  He has no remote or recent history of stroke or TIA. Patent right carotid endarterectomy with minimal restenosis observed.  Evidence of <40% stenosis of the left internal carotid artery. Bilateral vertebral artery is antegrade.       No significant change compared to prior exam.   About 2005 he had bilateral stents placed for claudication symptoms in upper thighs, with 100% relief. He has had progressive return of the same claudication symptoms. Unfortunately he continues to smoke  1 ppd. Will add ABI's when he returns in 1 year.  Plan:  Start daily 81 mg enteric coated ASA. Graduated walking program for PAD. Smoking cessation, he was counseled re this, but it adamant that he will not quit.  Follow-up in 1 year with Carotid Duplex scan and ABI's.   I discussed in depth with the patient the nature of atherosclerosis, and emphasized the importance of maximal medical management including strict control of blood pressure, blood glucose, and lipid levels, obtaining regular exercise, and cessation of smoking.  The patient is aware that without maximal medical management the underlying atherosclerotic disease process will progress, limiting the benefit of any interventions. The patient was given information about stroke prevention and what symptoms should prompt the patient to seek immediate medical care. Thank you for allowing us to participate in this patient's care.  Charisse MarchSuzanne Nickel, RN, MSN, FNP-C [facility] Office: [phone]  Clinic Physician: Imogene BurnChen  09/23/2013 3:41 PM

## 2013-12-02 ENCOUNTER — Encounter: Payer: Self-pay | Admitting: Interventional Cardiology

## 2014-01-04 ENCOUNTER — Emergency Department (HOSPITAL_COMMUNITY): Payer: Medicare PPO

## 2014-01-04 ENCOUNTER — Encounter (HOSPITAL_COMMUNITY): Payer: Self-pay | Admitting: Emergency Medicine

## 2014-01-04 ENCOUNTER — Inpatient Hospital Stay (HOSPITAL_COMMUNITY): Payer: Medicare PPO

## 2014-01-04 ENCOUNTER — Inpatient Hospital Stay (HOSPITAL_COMMUNITY)
Admission: EM | Admit: 2014-01-04 | Discharge: 2014-01-05 | DRG: 291 | Disposition: A | Discharge status: Home / Self Care | Payer: Medicare PPO | Attending: Internal Medicine | Admitting: Internal Medicine

## 2014-01-04 DIAGNOSIS — K219 Gastro-esophageal reflux disease without esophagitis: Secondary | ICD-10-CM | POA: Diagnosis present

## 2014-01-04 DIAGNOSIS — J449 Chronic obstructive pulmonary disease, unspecified: Secondary | ICD-10-CM | POA: Diagnosis present

## 2014-01-04 DIAGNOSIS — N183 Chronic kidney disease, stage 3 unspecified: Secondary | ICD-10-CM | POA: Diagnosis present

## 2014-01-04 DIAGNOSIS — I2510 Atherosclerotic heart disease of native coronary artery without angina pectoris: Secondary | ICD-10-CM | POA: Diagnosis present

## 2014-01-04 DIAGNOSIS — Z8249 Family history of ischemic heart disease and other diseases of the circulatory system: Secondary | ICD-10-CM | POA: Diagnosis not present

## 2014-01-04 DIAGNOSIS — Z9119 Patient's noncompliance with other medical treatment and regimen: Secondary | ICD-10-CM

## 2014-01-04 DIAGNOSIS — J9620 Acute and chronic respiratory failure, unspecified whether with hypoxia or hypercapnia: Secondary | ICD-10-CM | POA: Diagnosis present

## 2014-01-04 DIAGNOSIS — J4489 Other specified chronic obstructive pulmonary disease: Secondary | ICD-10-CM | POA: Diagnosis present

## 2014-01-04 DIAGNOSIS — I4892 Unspecified atrial flutter: Secondary | ICD-10-CM | POA: Diagnosis present

## 2014-01-04 DIAGNOSIS — Z981 Arthrodesis status: Secondary | ICD-10-CM | POA: Diagnosis not present

## 2014-01-04 DIAGNOSIS — J9601 Acute respiratory failure with hypoxia: Secondary | ICD-10-CM

## 2014-01-04 DIAGNOSIS — I5033 Acute on chronic diastolic (congestive) heart failure: Secondary | ICD-10-CM

## 2014-01-04 DIAGNOSIS — I4891 Unspecified atrial fibrillation: Secondary | ICD-10-CM | POA: Diagnosis present

## 2014-01-04 DIAGNOSIS — M109 Gout, unspecified: Secondary | ICD-10-CM | POA: Diagnosis present

## 2014-01-04 DIAGNOSIS — T82897A Other specified complication of cardiac prosthetic devices, implants and grafts, initial encounter: Secondary | ICD-10-CM | POA: Diagnosis present

## 2014-01-04 DIAGNOSIS — Z8673 Personal history of transient ischemic attack (TIA), and cerebral infarction without residual deficits: Secondary | ICD-10-CM | POA: Diagnosis not present

## 2014-01-04 DIAGNOSIS — Z951 Presence of aortocoronary bypass graft: Secondary | ICD-10-CM | POA: Diagnosis not present

## 2014-01-04 DIAGNOSIS — I252 Old myocardial infarction: Secondary | ICD-10-CM | POA: Diagnosis not present

## 2014-01-04 DIAGNOSIS — J9600 Acute respiratory failure, unspecified whether with hypoxia or hypercapnia: Secondary | ICD-10-CM | POA: Diagnosis present

## 2014-01-04 DIAGNOSIS — I5023 Acute on chronic systolic (congestive) heart failure: Secondary | ICD-10-CM | POA: Diagnosis present

## 2014-01-04 DIAGNOSIS — F17200 Nicotine dependence, unspecified, uncomplicated: Secondary | ICD-10-CM

## 2014-01-04 DIAGNOSIS — I509 Heart failure, unspecified: Secondary | ICD-10-CM | POA: Diagnosis present

## 2014-01-04 DIAGNOSIS — I059 Rheumatic mitral valve disease, unspecified: Secondary | ICD-10-CM

## 2014-01-04 DIAGNOSIS — I129 Hypertensive chronic kidney disease with stage 1 through stage 4 chronic kidney disease, or unspecified chronic kidney disease: Secondary | ICD-10-CM | POA: Diagnosis present

## 2014-01-04 DIAGNOSIS — I5041 Acute combined systolic (congestive) and diastolic (congestive) heart failure: Principal | ICD-10-CM | POA: Diagnosis present

## 2014-01-04 DIAGNOSIS — J810 Acute pulmonary edema: Secondary | ICD-10-CM

## 2014-01-04 DIAGNOSIS — Z91199 Patient's noncompliance with other medical treatment and regimen due to unspecified reason: Secondary | ICD-10-CM | POA: Diagnosis not present

## 2014-01-04 DIAGNOSIS — I739 Peripheral vascular disease, unspecified: Secondary | ICD-10-CM | POA: Diagnosis present

## 2014-01-04 DIAGNOSIS — E785 Hyperlipidemia, unspecified: Secondary | ICD-10-CM | POA: Diagnosis present

## 2014-01-04 LAB — CBC
HCT: 43.9 % (ref 39.0–52.0)
HEMOGLOBIN: 14.8 g/dL (ref 13.0–17.0)
MCH: 29.6 pg (ref 26.0–34.0)
MCHC: 33.7 g/dL (ref 30.0–36.0)
MCV: 87.8 fL (ref 78.0–100.0)
PLATELETS: 148 10*3/uL — AB (ref 150–400)
RBC: 5 MIL/uL (ref 4.22–5.81)
RDW: 14.4 % (ref 11.5–15.5)
WBC: 8.3 10*3/uL (ref 4.0–10.5)

## 2014-01-04 LAB — BASIC METABOLIC PANEL
Anion gap: 13 (ref 5–15)
BUN: 16 mg/dL (ref 6–23)
CALCIUM: 9.8 mg/dL (ref 8.4–10.5)
CO2: 26 meq/L (ref 19–32)
CREATININE: 1.56 mg/dL — AB (ref 0.50–1.35)
Chloride: 104 mEq/L (ref 96–112)
GFR calc Af Amer: 49 mL/min — ABNORMAL LOW (ref >90)
GFR calc non Af Amer: 42 mL/min — ABNORMAL LOW (ref >90)
GLUCOSE: 104 mg/dL — AB (ref 70–99)
Potassium: 4 mEq/L (ref 3.7–5.3)
Sodium: 143 mEq/L (ref 137–147)

## 2014-01-04 LAB — PRO B NATRIURETIC PEPTIDE: Pro B Natriuretic peptide (BNP): 3786 pg/mL — ABNORMAL HIGH (ref 0–125)

## 2014-01-04 LAB — TROPONIN I: Troponin I: <0.3 ng/mL (ref <0.30)

## 2014-01-04 MED ORDER — SODIUM CHLORIDE 0.9 % IJ SOLN
3.0000 mL | Freq: Two times a day (BID) | INTRAMUSCULAR | Status: DC
  Administered 2014-01-04 – 2014-01-05 (×3): 3 mL via INTRAVENOUS

## 2014-01-04 MED ORDER — CARVEDILOL 6.25 MG PO TABS
6.2500 mg | ORAL_TABLET | Freq: Two times a day (BID) | ORAL | Status: DC
Start: 2014-01-04 — End: 2014-01-05
  Administered 2014-01-04 – 2014-01-05 (×3): 6.25 mg via ORAL
  Filled 2014-01-04 (×4): qty 1

## 2014-01-04 MED ORDER — MELATONIN 5 MG PO TABS: 15.0000 mg | ORAL_TABLET | Freq: Every day | ORAL | Status: DC

## 2014-01-04 MED ORDER — ZOLPIDEM TARTRATE 5 MG PO TABS
5.0000 mg | ORAL_TABLET | Freq: Every evening | ORAL | Status: DC | PRN
Start: 2014-01-04 — End: 2014-01-05
  Administered 2014-01-05: 5 mg via ORAL
  Filled 2014-01-04: qty 1

## 2014-01-04 MED ORDER — DOCUSATE SODIUM 100 MG PO CAPS
100.0000 mg | ORAL_CAPSULE | Freq: Two times a day (BID) | ORAL | Status: DC
  Administered 2014-01-04 – 2014-01-05 (×3): 100 mg via ORAL
  Filled 2014-01-04 (×4): qty 1

## 2014-01-04 MED ORDER — POTASSIUM CHLORIDE CRYS ER 20 MEQ PO TBCR
40.0000 meq | EXTENDED_RELEASE_TABLET | Freq: Every day | ORAL | Status: DC
  Administered 2014-01-04 – 2014-01-05 (×2): 40 meq via ORAL
  Filled 2014-01-04 (×2): qty 2

## 2014-01-04 MED ORDER — IPRATROPIUM-ALBUTEROL 0.5-2.5 (3) MG/3ML IN SOLN
3.0000 mL | Freq: Four times a day (QID) | RESPIRATORY_TRACT | Status: DC
  Administered 2014-01-04 – 2014-01-05 (×5): 3 mL via RESPIRATORY_TRACT
  Filled 2014-01-04 (×4): qty 3
  Filled 2014-01-04: qty 6
  Filled 2014-01-04: qty 3

## 2014-01-04 MED ORDER — FUROSEMIDE 10 MG/ML IJ SOLN
40.0000 mg | Freq: Once | INTRAMUSCULAR | Status: AC
  Administered 2014-01-04: 40 mg via INTRAVENOUS
  Filled 2014-01-04: qty 4

## 2014-01-04 MED ORDER — ATORVASTATIN CALCIUM 10 MG PO TABS
10.0000 mg | ORAL_TABLET | Freq: Every day | ORAL | Status: DC
  Administered 2014-01-04: 10 mg via ORAL
  Filled 2014-01-04 (×2): qty 1

## 2014-01-04 MED ORDER — NITROGLYCERIN 0.4 MG SL SUBL: 0.4000 mg | SUBLINGUAL_TABLET | SUBLINGUAL | Status: DC | PRN

## 2014-01-04 MED ORDER — SODIUM CHLORIDE 0.9 % IJ SOLN
3.0000 mL | INTRAMUSCULAR | Status: DC | PRN
Start: 2014-01-04 — End: 2014-01-05

## 2014-01-04 MED ORDER — HEPARIN SODIUM (PORCINE) 5000 UNIT/ML IJ SOLN
5000.0000 [IU] | Freq: Three times a day (TID) | INTRAMUSCULAR | Status: DC
  Administered 2014-01-04 – 2014-01-05 (×3): 5000 [IU] via SUBCUTANEOUS
  Filled 2014-01-04 (×4): qty 1

## 2014-01-04 MED ORDER — ONDANSETRON HCL 4 MG/2ML IJ SOLN: 4.0000 mg | Freq: Four times a day (QID) | INTRAMUSCULAR | Status: DC | PRN

## 2014-01-04 MED ORDER — ACETAMINOPHEN 325 MG PO TABS: 650.0000 mg | ORAL_TABLET | ORAL | Status: DC | PRN

## 2014-01-04 MED ORDER — ASPIRIN EC 81 MG PO TBEC
81.0000 mg | DELAYED_RELEASE_TABLET | Freq: Every day | ORAL | Status: DC
  Administered 2014-01-04: 81 mg via ORAL
  Filled 2014-01-04: qty 1

## 2014-01-04 MED ORDER — SODIUM CHLORIDE 0.9 % IV SOLN: 250.0000 mL | INTRAVENOUS | Status: DC | PRN

## 2014-01-04 MED ORDER — FLEET ENEMA 7-19 GM/118ML RE ENEM
1.0000 | ENEMA | Freq: Once | RECTAL | Status: DC
  Filled 2014-01-04: qty 1

## 2014-01-04 MED ORDER — FUROSEMIDE 10 MG/ML IJ SOLN
40.0000 mg | Freq: Two times a day (BID) | INTRAMUSCULAR | Status: DC
  Administered 2014-01-04 – 2014-01-05 (×2): 40 mg via INTRAVENOUS
  Filled 2014-01-04 (×3): qty 4

## 2014-01-04 MED ORDER — NICOTINE 21 MG/24HR TD PT24
21.0000 mg | MEDICATED_PATCH | Freq: Every day | TRANSDERMAL | Status: DC
  Administered 2014-01-04 – 2014-01-05 (×2): 21 mg via TRANSDERMAL
  Filled 2014-01-04 (×2): qty 1

## 2014-01-04 NOTE — Progress Notes (Signed)
Patient came to around 0810,  Alert oriented, no c/o pain, cp, or shortness of breath, v/s 158/ 75, HR. 82, T. 97.7, 100%/RA. SR on the monitor. Will continue to monitor patient.

## 2014-01-04 NOTE — ED Notes (Signed)
Dr. Oni at bedside. 

## 2014-01-04 NOTE — ED Notes (Signed)
Wife states pt has been experiencing increasing shortness of breath over past 5 days culminating in distress this morning.  Pt alert, reports he feels he is breathing better now and is ready to go home.  Is not on O2 at home.

## 2014-01-04 NOTE — ED Notes (Signed)
Shortness of breath that began 5 days ago, worsening daily.  EMS gave [facility] en route.

## 2014-01-04 NOTE — ED Provider Notes (Signed)
CSN: [id number]     Arrival date & time 01/04/14  0425 History   First MD Initiated Contact with Patient 01/04/14 [phone]     Chief Complaint  Patient presents with  . Shortness of Breath     (Consider location/radiation/quality/duration/timing/severity/associated sxs/prior Treatment) HPI  Nathan [name] is a 73 year old male with a past medical history of coronary artery disease hypertension, COPD, and congestive heart failure. Patient presents today after worsening shortness of breath and cough over the past 5 days. Patient states he needs is an additional pillow tonight to avoid being short of breath.  He has had worsening dyspnea on exertion as well. He denies worsening swelling in his legs. Per his wife his cough became significantly worse tonight. He is a smoker, however his normal cough has progressively gotten worse. She denies fevers or recent infections. There is no chest pain there is no abdominal pain changes in his urine or bowel. Patient states he has not been taking any of his medications including his medicines for COPD and CHF.  Past Medical History  Diagnosis Date  . Aorto-iliac disease     bilat with stent 2007  . CAD (coronary artery disease)     with loop to the circumflex 2000, inferolateral infarction  . Hypertension   . ED (erectile dysfunction)   . COPD (chronic obstructive pulmonary disease)   . Hyperlipidemia   . Stroke   . Shortness of breath   . GERD (gastroesophageal reflux disease)   . Gout   . Paroxysmal atrial flutter     ablation  . Myocardial infarction 2013    Heart Attact   Past Surgical History  Procedure Laterality Date  . Spinal fusion    . Hand surgery    . Neck fusion  1975  . Eye surgery    . Coronary artery bypass graft  10/31/2011    Procedure: CORONARY ARTERY BYPASS GRAFTING (CABG);  Surgeon: Delight Ovens, MD;  Location[facility] OR;  Service: Open Heart Surgery;  Laterality: N/A;  times three using  Left Internal Mammary Artery and Right  Greater Saphenous Vein Graft harvested endoscopically; TEE  . Endarterectomy  10/31/2011    Procedure: ENDARTERECTOMY CAROTID;  Surgeon: Nada Libman, MD;  [facility];  Service: Vascular;  Laterality: Right;  with patch angioplasty   . Carotid endarterectomy  10-31-2011    right  . Colonoscopy N/A 10/30/2012    Procedure: COLONOSCOPY;  Surgeon: Shirley Friar, MD;  Location: [facility] ENDOSCOPY;  Service: Endoscopy;  Laterality: N/A;   Family History  Problem Relation Age of Onset  . Cancer Mother   . Tuberculosis Mother   . Heart disease Father     He did NOT have it before age 7   History  Substance Use Topics  . Smoking status: Current Some Day Smoker -- 1.00 packs/day for 64 years    Types: Cigarettes  . Smokeless tobacco: Never Used  . 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    Allergies  Review of patient's allergies indicates no known allergies.  Home Medications   Prior to Admission medications   Medication Sig Start Date End Date Taking? Authorizing Provider  Melatonin 5 MG TABS Take 15  mg by mouth at bedtime.   Yes Historical Provider, MD  nitroGLYCERIN (NITROSTAT) 0.4 MG SL tablet Place 0.4 mg under the tongue every 5 (five) minutes as needed for chest pain.   Yes Historical Provider, MD   BP 172/86  Pulse 84  Temp(Src) 97.7 F (36.5 C) (Oral)  Resp 22  Ht 6' 1.25" (1.861 m)  Wt 185 lb (83.915 kg)  BMI 24.23 kg/m2  SpO2 94% Physical Exam  Nursing note and vitals reviewed. Constitutional: He is oriented to person, place, and time. Vital signs are normal. He appears well-developed and well-nourished.  Non-toxic appearance. He does not appear ill. No distress.  HENT:  Head: Normocephalic and atraumatic.  Nose: Nose normal.  Mouth/Throat:  Oropharynx is clear and moist. No oropharyngeal exudate.  Eyes: Conjunctivae and EOM are normal. Pupils are equal, round, and reactive to light. No scleral icterus.  Neck: Normal range of motion. Neck supple. No tracheal deviation, no edema, no erythema and normal range of motion present. No mass and no thyromegaly present.  Cardiovascular: Normal rate, regular rhythm, S1 normal, S2 normal, intact distal pulses and normal pulses.  Exam reveals no gallop and no friction rub.   Murmur heard. Pulses:      Radial pulses are 2+ on the right side, and 2+ on the left side.       Dorsalis pedis pulses are 2+ on the right side, and 2+ on the left side.  Pulmonary/Chest: Effort normal and breath sounds normal. No respiratory distress. He has no wheezes. He has no rhonchi. He has no rales.  Tachypnea noted.  Crackles heard at bilateral bases.  Abdominal: Soft. Normal appearance and bowel sounds are normal. He exhibits no distension, no ascites and no mass. There is no hepatosplenomegaly. There is no tenderness. There is no rebound, no guarding and no CVA tenderness.  Musculoskeletal: Normal range of motion. He exhibits no edema and no tenderness.  Lymphadenopathy:    He has no cervical adenopathy.  Neurological: He is alert and oriented to person, place, and time. He has normal strength. No cranial nerve deficit or sensory deficit. GCS eye subscore is 4. GCS verbal subscore is 5. GCS motor subscore is 6.  Skin: Skin is warm, dry and intact. No petechiae and no rash noted. He is not diaphoretic. No erythema. No pallor.  Psychiatric: He has a normal mood and affect. His behavior is normal. Judgment normal.    ED Course  Procedures (including critical care time) Labs Review Labs Reviewed  CBC - Abnormal; Notable for the following:    Platelets 148 (*)    All other components within normal limits  BASIC METABOLIC PANEL - Abnormal; Notable for the following:    Glucose, Bld 104 (*)    Creatinine, Ser  1.56 (*)    GFR calc non Af Amer 42 (*)    GFR calc Af Amer 49 (*)    All other components within normal limits  PRO B NATRIURETIC PEPTIDE - Abnormal; Notable for the following:    Pro B Natriuretic peptide (BNP) 3786.0 (*)    All other components within normal limits  TROPONIN I    Imaging Review Dg Chest Port 1 View  01/04/2014   CLINICAL DATA:  Shortness of breath  EXAM: PORTABLE CHEST - 1 VIEW  COMPARISON:  10/28/2012  FINDINGS: Cardiac enlargement with pulmonary vascular congestion. Perihilar airspace infiltrates consistent with edema. Bilateral pleural effusions with basilar atelectasis. No pneumothorax. Postoperative changes in the mediastinum.  IMPRESSION: Congestive  changes with cardiac enlargement, pulmonary vascular congestion, pulmonary edema, and bilateral effusions.   Electronically Signed   By: Burman Nieves M.D.   On: 01/04/2014 05:19     EKG Interpretation   Date/Time:  Wednesday January 04 2014 04:35:26 EDT Ventricular Rate:  93 PR Interval:  168 QRS Duration: 85 QT Interval:  362 QTC Calculation: 450 R Axis:   46 Text Interpretation:  Sinus rhythm Probable left atrial enlargement  Posterior infarct, old Nonspecific repol abnormality, diffuse leads No  significant change since last tracing Confirmed by Erroll Luna  [phone]) on 01/04/2014 7:27:49 AM      MDM   Final diagnoses:  CHF (congestive heart failure), NYHA class I, acute on chronic, systolic    Mr. Laurel presents today for worsening shortness of breath, which got acutely severe tonight and woke him out of sleep. His history and exam findings are consistent with a CHF exacerbation. Patient admits to being noncompliant with his medications. He was given 40 mg of IV Lasix in the emergency department. EKG is nonischemic, BNP was 4000.  Patient also became hypoxic while in emergency department. His low suction saturation was 89% on room air, he is now 98% on 3 L nasal cannula. Due to the patient's  hypoxia need be retained in the hospital to get fluid removed.    Nathan Crumble, MD 01/04/14 [phone]

## 2014-01-04 NOTE — ED Notes (Signed)
Pt resting at this time; no needs 

## 2014-01-04 NOTE — H&P (Signed)
[facility]                                                                                    Patient Demographics  Nathan [name], is a 73 y.o. male  MRN: [id number]   DOB - 1940/09/18  Admit Date - 01/04/2014  Outpatient Primary MD for the patient is Lillia Mountain, MD Primary Cardiologist:  Dr. Verdis Prime   With History of -  Past Medical History  Diagnosis Date  . Aorto-iliac disease     bilat with stent 2007  . CAD (coronary artery disease)     with loop to the circumflex 2000, inferolateral infarction  . Hypertension   . ED (erectile dysfunction)   . COPD (chronic obstructive pulmonary disease)   . Hyperlipidemia   . Stroke   . Shortness of breath   . GERD (gastroesophageal reflux disease)   . Gout   . Paroxysmal atrial flutter     ablation  . Myocardial infarction 2013    Heart Attact      Past Surgical History  Procedure Laterality Date  . Spinal fusion    . Hand surgery    . Neck fusion  1975  . Eye surgery    . Coronary artery bypass graft  10/31/2011    Procedure: CORONARY ARTERY BYPASS GRAFTING (CABG);  Surgeon: Delight Ovens, MD;  Location: [facility] OR;  Service: Open Heart Surgery;  Laterality: N/A;  times three using  Left Internal Mammary Artery and Right Greater Saphenous Vein Graft harvested endoscopically; TEE  . Endarterectomy  10/31/2011    Procedure: ENDARTERECTOMY CAROTID;  Surgeon: Nada Libman, MD;  Location: [facility] OR;  Service: Vascular;  Laterality: Right;  with patch angioplasty   . Carotid endarterectomy  10-31-2011    right  . Colonoscopy N/A 10/30/2012    Procedure: COLONOSCOPY;  Surgeon: Shirley Friar, MD;  Location: [facility] ENDOSCOPY;  Service: Endoscopy;  Laterality: N/A;    in for   Chief Complaint  Patient presents with  . Shortness of Breath     HPI  Nathan [name]  is a 73 y.o. male, with a PMH of COPD (not on home oxygen), Tobacco abuse, CAD, PAD, and CHF.  He presents to the ER today with his wife, from home.   He reports progressively worsening SOB x 5 days.  He has also noticed ankle swelling, coughing up white sputum, and needing additional pillows to prop himself up in order to sleep.  He mentions that he used to be on lasix but decided to stop taking all of his medications about 3 months ago.  He continues to smoke.  Denies chest pain, HA, anorexia, vomiting, dysuria or changes in bowel habit.  CXR is notable for CHF, last echo in 2013 shows an EF of 50 - 55%.  BNP is elevated at 3786. He will be admitted by [facility] under the CHF order set.  Review of Systems    In addition to the HPI above No Fever-chills, No Headache, No changes with Vision or hearing, No problems swallowing food or Liquids, No Chest pain, Cough or Shortness of Breath, No Abdominal pain, No Nausea or Vomiting, Bowel  movements are regular, No Blood in stool or Urine, No dysuria, No new skin rashes or bruises, No new joints pains-aches,  No new weakness, tingling, numbness in any extremity, No polyuria, polydypsia or polyphagia, .  A full 10 point Review of Systems was done, except as stated above, all other Review of Systems were negative.   Social History History  Substance Use Topics  . Smoking status: Current Some Day Smoker -- 1.00 packs/day for 64 years    Types: Cigarettes  . Smokeless tobacco: Never Used  . Alcohol Use: 3.6 oz/week    6 Shots of liquor per week     Comment: previous history of heavy alcohol use 12 pack of beer /day  now 4-5 drinks per week  ** Patient reports to me that he does not drink or use recreational drugs.   Family History Family History  Problem Relation Age of Onset  . Cancer Mother   . Tuberculosis Mother   . Heart disease Father     He did NOT have it before age 35     Prior to Admission medications   Medication Sig Start Date End Date Taking? Authorizing Provider  Melatonin 5 MG TABS Take 15 mg by mouth at bedtime.   Yes Historical Provider, MD  nitroGLYCERIN (NITROSTAT)  0.4 MG SL tablet Place 0.4 mg under the tongue every 5 (five) minutes as needed for chest pain.   Yes Historical Provider, MD    No Known Allergies  Physical Exam  Vitals  Blood pressure 175/86, pulse 87, temperature 97.7 F (36.5 C), temperature source Oral, resp. rate 28, height 6' 1.25" (1.861 m), weight 83.915 kg (185 lb), SpO2 96.00%.   General: Wd, Wn 73 yo male, in bed in NAD, audible wheezing.  Flushed slightly red.  Psych:  Normal affect and insight, Not Suicidal or Homicidal, Awake Alert, Oriented X 3.  Neuro:   No F.N deficits, ALL C.Nerves Intact, Strength 5/5 all 4 extremities, Sensation intact all 4 extremities  ENT:  Ears and Eyes appear Normal, Conjunctivae clear, PERRLA. Moist Oral Mucosa.  Neck:  Supple Neck, + JVD, No cervical lymphadenopathy appreciated  Respiratory:  Crackles at the bases bilaterally.  No increased work of breathing.  On O2 via n/c at 2L  Cardiac:  RRR, No Gallops, Rubs or Murmurs, No Parasternal Heave. Well healed central scar.  1-2+ edema at the ankles.  Abdomen:  Positive Bowel Sounds, Abdomen Soft, ++Tenderness in Right lower quadrant, ++ Guarding.  Skin:  No Cyanosis, Normal Skin Turgor, No Skin Rash or Bruise.  Extremities:  Good muscle tone,  joints appear normal , no effusions, Normal ROM.   Data Review  CBC  Recent Labs Lab 01/04/14 0450  WBC 8.3  HGB 14.8  HCT 43.9  PLT 148*  MCV 87.8  MCH 29.6  MCHC 33.7  RDW 14.4   ------------------------------------------------------------------------------------------------------------------  Chemistries   Recent Labs Lab 01/04/14 0450  NA 143  K 4.0  CL 104  CO2 26  GLUCOSE 104*  BUN 16  CREATININE 1.56*  CALCIUM 9.8    Cardiac Enzymes  Recent Labs Lab 01/04/14 0450  TROPONINI <0.30    ----------------------------------------------------------------------------------------------------------------  Imaging results:   Dg Chest Port 1 View  01/04/2014    CLINICAL DATA:  Shortness of breath  EXAM: PORTABLE CHEST - 1 VIEW  COMPARISON:  10/28/2012  FINDINGS: Cardiac enlargement with pulmonary vascular congestion. Perihilar airspace infiltrates consistent with edema. Bilateral pleural effusions with basilar atelectasis. No pneumothorax. Postoperative changes in  the mediastinum.  IMPRESSION: Congestive changes with cardiac enlargement, pulmonary vascular congestion, pulmonary edema, and bilateral effusions.   Electronically Signed   By: Burman Nieves M.D.   On: 01/04/2014 05:19    My personal review of EKG: Rhythm NSR    Assessment & Plan  Active Problems:   Hypertension   CAD (coronary artery disease)   Paroxysmal atrial flutter, (NSR on discharge 12/12)   COPD (chronic obstructive pulmonary disease), gold stage C.   Hyperlipidemia   Claudication in peripheral vascular disease   S/P CABG (coronary artery bypass graft)   Chronic kidney disease (CKD), stage III (moderate)   Chronic diastolic CHF (congestive heart failure)   Tobacco use disorder   Acute exacerbation of CHF (congestive heart failure)    Diastolic CHF exacerbation Likely due to not taking medications x 3 months. Admit under CHF order set. Will check 2D echo. Lasix IV 40 mg bid. HF team consulted.  COPD Not on oxygen at home.   Will start scheduled duonebs. Suggest d/c back on spiriva and inspra  RLQ Abdominal Tenderness Uncertain etiology Patient denies changes in bowel habits or previous pain. Will check dg abd 2v-to assess stool burden  CAD s/p CABG No chest pain currently.  Troponin is wnl 81 mg aspirin Lipitor restarted.  P- afib/aflutter NSR at this point, s/p ablation Not on anticoagulation. (needs to be?) Will be on tele  HTN Restarting lasix  Start low dose coreg. Avoided Ace due to CKD.  CKD stage 2 Creatinine at baseline. Will watch closely during diuresis.  HLD. Restarting statin.  Tobacco abuse Continues to smoke "Phillies" mini  cigars.  65 years. Does not appear inclined to stop. Nicotine patch.   DVT Prophylaxis Heparin   AM Labs Ordered, also please review Full Orders  Family Communication:   Patient alert and orientated    Code Status:  full  Likely DC to  Home with wife.  Condition:  Guarded.  Time spent in minutes : 60    York, Tora Kindred PA-C on 01/04/2014 at 7:51 AM  Between 7am to 7pm - Pager - [phone]  After 7pm go to [email or web address] - password [email or web address]  And look for the night coverage person covering me after hours  [facility] Office  [phone]  Attending Patient was seen, examined,treatment plan was discussed with the Physician extender. I have directly reviewed the clinical findings, lab, imaging studies and management of this patient in detail. I have made the necessary changes to the above noted documentation, and agree with the documentation, as recorded by the Physician extender.  Windell Norfolk MD [facility].

## 2014-01-04 NOTE — Progress Notes (Signed)
  Echocardiogram 2D Echocardiogram has been performed.  Cathie Beams 01/04/2014, 2:34 PM

## 2014-01-04 NOTE — Consult Note (Signed)
Advanced Heart Failure Team Consult Note  Referring Physician: Dr Jerral Ralph Primary Physician: Dr Valentina Lucks Primary Cardiologist:  Dr Katrinka Blazing   Reason for Consultation: Heart Failure   HPI:   The Heart Failure Team was asked to consult for assistance with heart failure by Dr Lucrezia Starch.   Mr. Durbin is a 73 y.o. gentlemen with a cardiac history that includes CAD s/p CABG x4 (LIMA-LAD, SVG-Diag, SVG-OM1 and SVG-PDA) in June 2013, had cath November 2013 for NSTEMI that showed patent SVG-RCA and LIMA-LAD but total occlusion of SVG-OM as well as SVG-diag. AFL s/p ablation, diastolic heart failure, hypertension. 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   Echo 11/13 EF 50-55%  Last admission June 2014 lower GI bleed. Colonoscopy performed June 21 showed blood in the colon but no definite source identified, diverticular versus AVM suspected. Aspirin stopped.    Admitted today with shortness of breath.  Prior to admit he stopped taking all medications 3 months ago.  Increased dyspnea with exertion. Denies PND/CP. Smokes 1 PPD. Does not wear oxygen at home. Weight at home 185 pounds. Appetite ok. He denies BRBPR.   In the ED he was given 80 mg IV lasix. Oxygen saturation on room was 89% and he was placed on 3 liters with oxygen saturations 98%.   CXR- pulmonary vasuclar congestion, bilateral effusions, and pulmonary edema.   Pertinent admission labs include: Pro BNP 3786 K 4.0 Creatinine 1.56 Hgb 14.8    General: Weight gain ; Weight loss ; Anorexia ; Fatigue [Y ]; Fever ; Chills ; Weakness   Cardiac: Chest pain/pressure ; Resting SOB [ T]; Exertional SOB [Y ]; Orthopnea ; Pedal Edema ; Palpitations ; Syncope ; Presyncope ; Paroxysmal nocturnal dyspnea[ ]   Pulmonary: Cough [ Y]; Wheezing[Y ]; Hemoptysis[ ] ; Sputum [ Y]; Snoring   GI: Vomiting[ ] ; Dysphagia[ ] ; Melena[ ] ;  Hematochezia ; Heartburn[ ] ; Abdominal pain ; Constipation ; Diarrhea ; BRBPR   GU: Hematuria[ ] ; Dysuria ; Nocturia[ ]   Vascular: Pain in legs with walking ; Pain in feet with lying flat ; Non-healing sores ; Stroke ; TIA ; Slurred speech ;  Neuro: Headaches[ ] ; Vertigo[ ] ; Seizures[ ] ; Paresthesias[ ] ;Blurred vision ; Diplopia ; Vision changes   Ortho/Skin: Arthritis ; Joint pain ; Muscle pain ; Joint swelling ; Back Pain ; Rash   Psych: Depression[ ] ; Anxiety[ ]   Heme: Bleeding problems ; Clotting disorders ; Anemia   Endocrine: Diabetes ; Thyroid dysfunction[ ]    Home Medications Prior to Admission medications   Medication Sig Start Date End Date Taking? Authorizing Provider  Melatonin 5 MG TABS Take 15 mg by mouth at bedtime.   Yes Historical Provider, MD  nitroGLYCERIN (NITROSTAT) 0.4 MG SL tablet Place 0.4 mg under the tongue every 5 (five) minutes as needed for chest pain.   Yes Historical Provider, MD    Past Medical History: Past Medical History  Diagnosis Date  . Aorto-iliac disease     bilat with stent 2007  . CAD (coronary artery disease)     with loop to the circumflex 2000, inferolateral infarction  . Hypertension   . ED (erectile dysfunction)   . COPD (chronic obstructive pulmonary disease)   .  Hyperlipidemia   . Stroke   . Shortness of breath   . GERD (gastroesophageal reflux disease)   . Gout   . Paroxysmal atrial flutter     ablation  . Myocardial infarction 2013    Heart Attact    Past Surgical History: Past Surgical History  Procedure Laterality Date  . Spinal fusion    . Hand surgery    . Neck fusion  1975  . Eye surgery    . Coronary artery bypass graft  10/31/2011    Procedure: CORONARY ARTERY BYPASS GRAFTING (CABG);  Surgeon: Delight Ovens, MD;  Location: [facility] OR;  Service: Open Heart Surgery;  Laterality: N/A;  times three using  Left Internal Mammary Artery and Right  Greater Saphenous Vein Graft harvested endoscopically; TEE  . Endarterectomy  10/31/2011    Procedure: ENDARTERECTOMY CAROTID;  Surgeon: Nada Libman, MD;  Location: [facility] OR;  Service: Vascular;  Laterality: Right;  with patch angioplasty   . Carotid endarterectomy  10-31-2011    right  . Colonoscopy N/A 10/30/2012    Procedure: COLONOSCOPY;  Surgeon: Shirley Friar, MD;  Location: [facility] ENDOSCOPY;  Service: Endoscopy;  Laterality: N/A;    Family History: Family History  Problem Relation Age of Onset  . Cancer Mother   . Tuberculosis Mother   . Heart disease Father     He did NOT have it before age 61    Social History: History   Social History  . Marital Status: Married    Spouse Name: N/A    Number of Children: N/A  . Years of Education: N/A   Occupational History  . retired    Social History Main Topics  . Smoking status: Current Some Day Smoker -- 1.00 packs/day for 64 years    Types: Cigarettes  . Smokeless tobacco: Never Used  . 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 Drug Use: Yes    Special: Marijuana     Comment: rarely  . Sexual Activity: None   Other Topics Concern  . None   Social History Narrative  . None    Allergies:  No Known Allergies  Objective:    Vital Signs:   Temp:  [97.7 F (36.5 C)] 97.7 F (36.5 C) (08/26 0435) Pulse Rate:  [84-94] 87 (08/26 0730) Resp:  [22-36] 28 (08/26 0730) BP: (152-183)/(71-92) 175/86 mmHg (08/26 0730) SpO2:  [92 %-96 %] 92 % (08/26 0855) Weight:  [181 lb (82.1 kg)-185 lb (83.915 kg)] 181 lb (82.1 kg) (08/26 0818) Last BM Date: 01/03/14  Weight change: Filed Weights   01/04/14 0432 01/04/14 0807 01/04/14 0818  Weight: 185 lb (83.915 kg) 181 lb (82.1 kg) 181 lb (82.1 kg)    Intake/Output:   Intake/Output Summary (Last 24 hours) at 01/04/14 0944 Last data filed at 01/04/14 0920  Gross  per 24 hour  Intake      0 ml  Output   1210 ml  Net  -1210 ml     Physical Exam: General:  Elderly chronically ill appearing. No resp difficulty HEENT: normal Neck: supple. JVP 9-10. Carotids 2+ bilat; no bruits. No lymphadenopathy or thryomegaly appreciated. Cor: PMI nondisplaced. Regular rate & rhythm. No rubs, gallops or murmurs. Lungs: EW throughout decreased in the bases on 3 liters [place] Abdomen: soft, nontender, nondistended. No hepatosplenomegaly. No bruits  or masses. Good bowel sounds. Extremities: no cyanosis, clubbing, rash, Rand LLE trace edema.  Neuro: alert & orientedx3, cranial nerves grossly intact. moves all 4 extremities w/o difficulty. Affect pleasant  Telemetry: SR  Labs: Basic Metabolic Panel:  Recent Labs Lab 01/04/14 0450  NA 143  K 4.0  CL 104  CO2 26  GLUCOSE 104*  BUN 16  CREATININE 1.56*  CALCIUM 9.8    Liver Function Tests: No results found for this basename: AST, ALT, ALKPHOS, BILITOT, PROT, ALBUMIN,  in the last 168 hours No results found for this basename: LIPASE, AMYLASE,  in the last 168 hours No results found for this basename: AMMONIA,  in the last 168 hours  CBC:  Recent Labs Lab 01/04/14 0450  WBC 8.3  HGB 14.8  HCT 43.9  MCV 87.8  PLT 148*    Cardiac Enzymes:  Recent Labs Lab 01/04/14 0450  TROPONINI <0.30    BNP: BNP (last 3 results)  Recent Labs  01/04/14 0450  PROBNP 3786.0*    CBG: No results found for this basename: GLUCAP,  in the last 168 hours  Coagulation Studies: No results found for this basename: LABPROT, INR,  in the last 72 hours  Other results: EKG: SR 93 bpm  Imaging: Dg Chest Port 1 View  01/04/2014   CLINICAL DATA:  Shortness of breath  EXAM: PORTABLE CHEST - 1 VIEW  COMPARISON:  10/28/2012  FINDINGS: Cardiac enlargement with pulmonary vascular congestion. Perihilar airspace infiltrates consistent with edema. Bilateral pleural effusions with basilar atelectasis. No pneumothorax.  Postoperative changes in the mediastinum.  IMPRESSION: Congestive changes with cardiac enlargement, pulmonary vascular congestion, pulmonary edema, and bilateral effusions.   Electronically Signed   By: Burman Nieves M.D.   On: 01/04/2014 05:19   Dg Abd 2 Views  01/04/2014   CLINICAL DATA:  Abdominal pain  EXAM: ABDOMEN - 2 VIEW  COMPARISON:  October 29, 2012  FINDINGS: Supine and upright images were obtained. There is diffuse stool throughout colon. Overall the bowel gas pattern is unremarkable. No obstruction or free air. Liver appears prominent. There are stents in each common iliac artery.  IMPRESSION: Diffuse stool throughout colon. Bowel gas pattern unremarkable. Liver appears prominent.   Electronically Signed   By: Bretta Bang M.D.   On: 01/04/2014 08:02      Medications:     Current Medications: . aspirin EC  81 mg Oral Daily  . atorvastatin  10 mg Oral q1800  . carvedilol  6.25 mg Oral BID WC  . docusate sodium  100 mg Oral BID  . furosemide  40 mg Intravenous BID  . heparin  5,000 Units Subcutaneous 3 times per day  . ipratropium-albuterol  3 mL Nebulization Q6H  . nicotine  21 mg Transdermal Daily  . potassium chloride  40 mEq Oral Daily  . sodium chloride  3 mL Intravenous Q12H     Infusions:      Assessment:    1. A/C Respiratory Failure  2. A/Chronic  Diastolic Heart Failure - 3. CAD s/p CABG/NSTEMI - (LIMA-LAD, SVG-Diag, SVG-OM1 and SVG-PDA) in June 2013, [facility] 03/2012 for NSTEMI that showed patent SVG-RCA and LIMA-LAD but total occlusion of SVG-OM as well as SVG-diag. 4. Continued tobacco abuse--> 1PPD  5. Noncompliance- off meds for 3 months  6. H/O GI bleed 10/2012 - aspirin stopped 2014   Plan/Discussion:   Mr Nathan [name] is a 20 old known to the HF team with chronic respiratory failure, CAD, and diastolic heart failure  admitted with increased shortness of breath in the setting of medication noncompliance.   Mild volume overload noted. Continue 40 mg IV  lasix twice a day as well as 40 meq of potassium daily. Check ECHO.   No evidence of ischemia. CE negative. EKG NSR. Continue carvedilol 6.25 mg twice a day and atorvastatin.   History of GI bleed in 2014 -->aspirin was stopped at that time.  Keep off aspirin.   Discussed smoking cessation however he declines.   Length of Stay: 0  CLEGG,AMY NP-C  01/04/2014, 9:44 AM  Advanced Heart Failure Team Pager [phone] (M-F; 7a - 4p)  Please contact [facility] for night-coverage after hours (4p -7a ) and weekends on [email or web address]  Patient seen and examined with Tonye Becket, NP. We discussed all aspects of the encounter. I agree with the assessment and plan as stated above.   73 y/o with diastolic HF presents with A/C HF after stopping lasix. Weight stable. BNP lower than previous. No significant edema on exam but CXR quite impressive pulmonary. Agree with IV diuresis for a couple days. Stressed need for compliance. Check repeat echo.   Reuel Boom Keyanna Sandefer,MD 4:49 PM

## 2014-01-04 NOTE — Care Management Note (Signed)
    Page 1 of 1   01/04/2014     4:23:43 PM CARE MANAGEMENT NOTE 01/04/2014  Patient:  Nathan [name], Nathan [name]   Account Number:  [id number]  Date Initiated:  01/04/2014  Documentation initiated by:  [facility]  Subjective/Objective Assessment:   73 y.o. male, with a PMH of COPD (not on home oxygen), Tobacco abuse, CAD, PAD, and CHF.  He presents to the ER today with his wife, from home.  He reports progressively worsening SOB x 5 days     Action/Plan:   Admit under CHF order set.  Will check 2D echo.  Lasix IV 40 mg bid.  HF team consulted.// Access for disposition needs   Anticipated DC Date:  01/09/2014   Anticipated DC Plan:  HOME/SELF CARE      DC Planning Services  CM consult      Choice offered to / List presented to:             Status of service:  In process, will continue to follow Medicare Important Message given?   (If response is "NO", the following Medicare IM given date fields will be blank) Date Medicare IM given:   Medicare IM given by:   Date Additional Medicare IM given:   Additional Medicare IM given by:    Discharge Disposition:    Per UR Regulation:  Reviewed for med. necessity/level of care/duration of stay  If discussed at Long Length of Stay Meetings, dates discussed:    Comments:

## 2014-01-04 NOTE — Progress Notes (Signed)
PHARMACIST - PHYSICIAN ORDER COMMUNICATION  CONCERNING: P&T Medication Policy on Herbal Medications  DESCRIPTION:  This patient's order for:  Melatonin  has been noted.  This product(s) is classified as an "herbal" or natural product. Due to a lack of definitive safety studies or FDA approval, nonstandard manufacturing practices, plus the potential risk of unknown drug-drug interactions while on inpatient medications, the Pharmacy and Therapeutics Committee does not permit the use of "herbal" or natural products of this type within [facility].   ACTION TAKEN: The pharmacy department is unable to verify this order at this time and your patient has been informed of this safety policy. Please reevaluate patient's clinical condition at discharge and address if the herbal or natural product(s) should be resumed at that time.  Arlean Hopping. Newman Pies, PharmD Clinical Pharmacist Pager [phone]

## 2014-01-05 DIAGNOSIS — I5023 Acute on chronic systolic (congestive) heart failure: Secondary | ICD-10-CM

## 2014-01-05 DIAGNOSIS — I5041 Acute combined systolic (congestive) and diastolic (congestive) heart failure: Secondary | ICD-10-CM | POA: Diagnosis present

## 2014-01-05 DIAGNOSIS — I4891 Unspecified atrial fibrillation: Secondary | ICD-10-CM

## 2014-01-05 DIAGNOSIS — J9600 Acute respiratory failure, unspecified whether with hypoxia or hypercapnia: Secondary | ICD-10-CM

## 2014-01-05 DIAGNOSIS — I509 Heart failure, unspecified: Secondary | ICD-10-CM

## 2014-01-05 LAB — BASIC METABOLIC PANEL
Anion gap: 13 (ref 5–15)
Anion gap: 9 (ref 5–15)
BUN: 20 mg/dL (ref 6–23)
BUN: 21 mg/dL (ref 6–23)
CALCIUM: 9 mg/dL (ref 8.4–10.5)
CHLORIDE: 101 meq/L (ref 96–112)
CO2: 29 meq/L (ref 19–32)
CO2: 32 meq/L (ref 19–32)
CREATININE: 1.6 mg/dL — AB (ref 0.50–1.35)
CREATININE: 1.64 mg/dL — AB (ref 0.50–1.35)
Calcium: 9.9 mg/dL (ref 8.4–10.5)
Chloride: 99 mEq/L (ref 96–112)
GFR calc Af Amer: 47 mL/min — ABNORMAL LOW (ref >90)
GFR calc Af Amer: 46 mL/min — ABNORMAL LOW (ref >90)
GFR calc non Af Amer: 41 mL/min — ABNORMAL LOW (ref >90)
GFR calc non Af Amer: 40 mL/min — ABNORMAL LOW (ref >90)
Glucose, Bld: 90 mg/dL (ref 70–99)
Glucose, Bld: 80 mg/dL (ref 70–99)
Potassium: 4.3 mEq/L (ref 3.7–5.3)
Potassium: 5.1 mEq/L (ref 3.7–5.3)
Sodium: 143 mEq/L (ref 137–147)
Sodium: 140 mEq/L (ref 137–147)

## 2014-01-05 MED ORDER — FUROSEMIDE 40 MG PO TABS: ORAL_TABLET | ORAL | Status: DC

## 2014-01-05 MED ORDER — POTASSIUM CHLORIDE CRYS ER 20 MEQ PO TBCR: 20.0000 meq | EXTENDED_RELEASE_TABLET | Freq: Every day | ORAL | Status: DC

## 2014-01-05 MED ORDER — ASPIRIN 81 MG PO CHEW: 81.0000 mg | CHEWABLE_TABLET | Freq: Every day | ORAL | Status: DC

## 2014-01-05 MED ORDER — CARVEDILOL 6.25 MG PO TABS
6.2500 mg | ORAL_TABLET | Freq: Two times a day (BID) | ORAL | Status: DC
Start: 2014-01-05 — End: 2014-04-24

## 2014-01-05 MED ORDER — ATORVASTATIN CALCIUM 10 MG PO TABS: 10.0000 mg | ORAL_TABLET | Freq: Every day | ORAL | Status: AC

## 2014-01-05 MED ORDER — CARVEDILOL 6.25 MG PO TABS
6.2500 mg | ORAL_TABLET | Freq: Two times a day (BID) | ORAL | Status: DC
Start: 2014-01-05 — End: 2014-01-05

## 2014-01-05 MED ORDER — FUROSEMIDE 40 MG PO TABS
40.0000 mg | ORAL_TABLET | Freq: Two times a day (BID) | ORAL | Status: DC
  Filled 2014-01-05 (×2): qty 1

## 2014-01-05 MED ORDER — ASPIRIN 81 MG PO CHEW
81.0000 mg | CHEWABLE_TABLET | Freq: Every day | ORAL | Status: DC
  Administered 2014-01-05: 81 mg via ORAL
  Filled 2014-01-05: qty 1

## 2014-01-05 MED ORDER — PANTOPRAZOLE SODIUM 40 MG PO TBEC
40.0000 mg | DELAYED_RELEASE_TABLET | Freq: Every day | ORAL | Status: DC
  Administered 2014-01-05: 40 mg via ORAL
  Filled 2014-01-05: qty 1

## 2014-01-05 NOTE — Progress Notes (Signed)
CARDIAC REHAB PHASE I   PRE:  Rate/Rhythm: 83 SR  BP:  Supine: 140/74  Sitting:   Standing:    SaO2: 92%RA  MODE:  Ambulation: 460 ft   POST:  Rate/Rhythm: 93 SR PVCs  BP:  Supine: 170/82  Sitting:   Standing:    SaO2: 91%RA 1340-1425 Pt walked 460 ft on RA with steady gait. Tolerated well. To bed after walk. Pt does not want to discuss smoking cessation so did not discuss. Pt stated he promised to take his meds and to weigh every day and he was able to state when to notify MD with weight gain. Encouraged pt to walk as tolerated. Has attended CRP 2 before and if he decides he would like to do again, he will discuss with cardiologist. Pt stated he eats out a lot as he and wife like different foods. Stated he stopped using salt shaker but likes a lot of the comfort foods. Going out to eat is important to him and not likely to change. Very pleasant pt.   Luetta Nutting, RN BSN  01/05/2014 2:20 PM

## 2014-01-05 NOTE — Progress Notes (Signed)
Advanced Heart Failure Rounding Note   Subjective:    Nathan [name] is a 73 y.o. gentlemen with a cardiac history that includes CAD s/p CABG x4 (LIMA-LAD, SVG-Diag, SVG-OM1 and SVG-PDA) in June 2013, had cath November 2013 for NSTEMI that showed patent SVG-RCA and LIMA-LAD but total occlusion of SVG-OM as well as SVG-diag. AFL s/p ablation, diastolic heart failure, hypertension. 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 Most recent ECHO 2013 with EF 50-55%.   Last admission June 2014 lower GI bleed. Colonoscopy performed June 21 showed blood in the colon but no definite source identified, diverticular versus AVM suspected. Aspirin stopped.   Admitted yesterday with shortness of breath. Diuresed with IV lasix.  Diuresed 2.7 liters.   Wants to go home. Breathing at baseline.    01/04/14 CXR- pulmonary vasuclar congestion, bilateral effusions, and pulmonary edema. 01/04/14 ECHO EF 45% Grade II DD   Creatinine 1.56>1.6   Objective:   Weight Range:  Vital Signs:   Temp:  [97.4 F (36.3 C)-98.1 F (36.7 C)] 97.4 F (36.3 C) (08/27 0520) Pulse Rate:  [75-87] 87 (08/27 0520) Resp:  [21-28] 22 (08/27 0520) BP: (150-175)/(69-86) 156/82 mmHg (08/27 0520) SpO2:  [92 %-100 %] 97 % (08/27 0520) FiO2 (%):  [2 %-3 %] 2 % (08/26 1900) Weight:  [175 lb 4.8 oz (79.516 kg)-181 lb (82.1 kg)] 175 lb 4.8 oz (79.516 kg) (08/27 0520) Last BM Date: 01/03/14  Weight change: Filed Weights   01/04/14 0807 01/04/14 0818 01/05/14 0520  Weight: 181 lb (82.1 kg) 181 lb (82.1 kg) 175 lb 4.8 oz (79.516 kg)    Intake/Output:   Intake/Output Summary (Last 24 hours) at 01/05/14 0725 Last data filed at 01/04/14 2208  Gross per 24 hour  Intake    363 ml  Output   3090 ml  Net  -2727 ml    Physical Exam:  General: Elderly chronically ill appearing. No resp difficulty  HEENT: normal  Neck: supple. JVP 6-7 Carotids  2+ bilat; no bruits. No lymphadenopathy or thryomegaly appreciated.  Cor: PMI nondisplaced. Regular rate & rhythm. No rubs, gallops or murmurs.  Lungs: LLL RLL crackles. EW throughout decreased in the bases on 3 liters [place]  Abdomen: soft, nontender, distended. No hepatosplenomegaly. No bruits or masses. Good bowel sounds.  Extremities: no cyanosis, clubbing, rash, Rand LLE edema.  Neuro: alert & orientedx3, cranial nerves grossly intact. moves all 4 extremities w/o difficulty. Affect pleasant  Telemetry: SR  Labs: Basic Metabolic Panel:  Recent Labs Lab 01/04/14 0450  NA 143  K 4.0  CL 104  CO2 26  GLUCOSE 104*  BUN 16  CREATININE 1.56*  CALCIUM 9.8    Liver Function Tests: No results found for this basename: AST, ALT, ALKPHOS, BILITOT, PROT, ALBUMIN,  in the last 168 hours No results found for this basename: LIPASE, AMYLASE,  in the last 168 hours No results found for this basename: AMMONIA,  in the last 168 hours  CBC:  Recent Labs Lab 01/04/14 0450  WBC 8.3  HGB 14.8  HCT 43.9  MCV 87.8  PLT 148*    Cardiac Enzymes:  Recent Labs Lab 01/04/14 0450  TROPONINI <0.30    BNP: BNP (last 3 results)  Recent Labs  01/04/14 0450  PROBNP 3786.0*     Other results:  EKG:   Imaging: Dg Chest Port 1 View  01/04/2014   CLINICAL DATA:  Shortness of  breath  EXAM: PORTABLE CHEST - 1 VIEW  COMPARISON:  10/28/2012  FINDINGS: Cardiac enlargement with pulmonary vascular congestion. Perihilar airspace infiltrates consistent with edema. Bilateral pleural effusions with basilar atelectasis. No pneumothorax. Postoperative changes in the mediastinum.  IMPRESSION: Congestive changes with cardiac enlargement, pulmonary vascular congestion, pulmonary edema, and bilateral effusions.   Electronically Signed   By: Burman Nieves M.D.   On: 01/04/2014 05:19   Dg Abd 2 Views  01/04/2014   CLINICAL DATA:  Abdominal pain  EXAM: ABDOMEN - 2 VIEW  COMPARISON:  October 29, 2012   FINDINGS: Supine and upright images were obtained. There is diffuse stool throughout colon. Overall the bowel gas pattern is unremarkable. No obstruction or free air. Liver appears prominent. There are stents in each common iliac artery.  IMPRESSION: Diffuse stool throughout colon. Bowel gas pattern unremarkable. Liver appears prominent.   Electronically Signed   By: Bretta Bang M.D.   On: 01/04/2014 08:02      Medications:     Scheduled Medications: . atorvastatin  10 mg Oral q1800  . carvedilol  6.25 mg Oral BID WC  . docusate sodium  100 mg Oral BID  . furosemide  40 mg Intravenous BID  . heparin  5,000 Units Subcutaneous 3 times per day  . ipratropium-albuterol  3 mL Nebulization Q6H  . nicotine  21 mg Transdermal Daily  . potassium chloride  40 mEq Oral Daily  . sodium chloride  3 mL Intravenous Q12H  . sodium phosphate  1 enema Rectal Once     Infusions:     PRN Medications:  sodium chloride, acetaminophen, nitroGLYCERIN, ondansetron (ZOFRAN) IV, sodium chloride, zolpidem   Assessment/Plan   1. A/C Respiratory Failure - resolved.  2. A/C Diastolic Heart Failure - ECHO EF 45% Grade II DD. Volume status improved. Weight down 6 pounds. Give another dose of IV lasix and transition to 40 mg twice a day. He is adamant he wants to go home. Need to walk and check O2 sats. Consult cardiac rehab. In the past he was on spiro but due to noncompliance would not start.  Renal function ok.  3. CAD s/p CABG/NSTEMI - (LIMA-LAD, SVG-Diag, SVG-OM1 and SVG-PDA) in June 2013, [facility] 03/2012 for NSTEMI that showed patent SVG-RCA and LIMA-LAD but total occlusion of SVG-OM as well as SVG-diag.  No chest pain. Continue atorvastatin and carvedilol. Can restart ASA 81 mg daily.   4. Continued tobacco abuse--> 1PPD Declines smoking cessation  5. Noncompliance- off meds for 3 months  6. H/O GI bleed 10/2012 - aspirin stopped 2014 Denies BRBPR, can restart ASA 81 mg daily.  7. HTN   Length of  Stay: 1  CLEGG,AMY NP-C  01/05/2014, 7:25 AM  Advanced Heart Failure Team Pager [phone] (M-F; 7a - 4p)  Please contact [facility] for night-coverage after hours (4p -7a ) and weekends on [email or web address]  Patient seen with NP, agree with the above note.  Volume improved, walked today without desaturation.  Wants very much to go home.  I think we can transition him to Lasix 40 mg po bid x 1 week with KCl 20 daily.  After 1 week, decrease to Lasix 40 mg daily.  He can followup in CHF clinic next week with BMET.    GI bleed was > 1 year ago.  I think that he can restart ASA 81 mg daily given CAD.   Marca Ancona 01/05/2014 11:29 AM

## 2014-01-05 NOTE — Progress Notes (Signed)
Patient is discharged. DC instructions given, pt verbalizes understanding. DC tele, DC IV. All questions answered.

## 2014-01-05 NOTE — Discharge Summary (Signed)
Physician Discharge Summary  MAHMOUD BLAZEJEWSKI UJW:[id number] DOB: [birth year] DOA: 01/04/2014  PCP: Lillia Mountain, MD  Admit date: 01/04/2014 Discharge date: 01/05/2014  Time spent: 35 minutes  Recommendations for Outpatient Follow-up:  1. Please follow up on BMP on hospital follow, he was discharged on Lasix 40 mg PO BID x 1 week for heart failure 2. Follow up on patient's weights  Discharge Diagnoses:  Principal Problem:   Acute combined systolic and diastolic congestive heart failure Active Problems:   Hypertension   CAD (coronary artery disease)   Paroxysmal atrial flutter, (NSR on discharge 12/12)   COPD (chronic obstructive pulmonary disease), gold stage C.   Hyperlipidemia   Claudication in peripheral vascular disease   S/P CABG (coronary artery bypass graft)   Chronic kidney disease (CKD), stage III (moderate)   Chronic diastolic CHF (congestive heart failure)   Tobacco use disorder   Acute CHF   Acute respiratory failure   Acute pulmonary edema   Discharge Condition: Stable  Diet recommendation: Heart healthy  Filed Weights   01/04/14 0807 01/04/14 0818 01/05/14 0520  Weight: 82.1 kg (181 lb) 82.1 kg (181 lb) 79.516 kg (175 lb 4.8 oz)    History of present illness:  Nathan [name] is a 73 y.o. male, with a PMH of COPD (not on home oxygen), Tobacco abuse, CAD, PAD, and CHF. He presents to the ER today with his wife, from home. He reports progressively worsening SOB x 5 days. He has also noticed ankle swelling, coughing up white sputum, and needing additional pillows to prop himself up in order to sleep. He mentions that he used to be on lasix but decided to stop taking all of his medications about 3 months ago. He continues to smoke. Denies chest pain, HA, anorexia, vomiting, dysuria or changes in bowel habit. CXR is notable for CHF, last echo in 2013 shows an EF of 50 - 55%. BNP is elevated at 3786. He will be admitted by [facility] under the CHF order set.   Hospital  Course:  Patient is a pleasant 73 year old with a past medical history of combined systolic and diastolic CHF, admitted to the medicine service on 01/04/2014, presenting with complaints of shortness of breath, progressively worsening over the past 5 days. Portable chest x-ray showed pulmonary vascular congestion. He was started on IV diuresis for acute on chronic combined systolic and diastolic congestive heart failure. He was seen and evaluated by cardiology. Patient showing marked improvement overnight. By the following morning he was cleared for discharge by cardiology, recommending Lasix 40 mg PO BID for 1 week followed by 40 mg PO daily thereafter. He was scheduled for follow up at the heart failure clininc in 1 week.   Procedures:  2D Echo impression: The cavity size was normal. Wall thickness was increased in a pattern of mild LVH. The estimated ejection fraction was 45%. Basal to mid inferolateral, basal anterolateral, and basal inferior akinesis. Features are consistent with a pseudonormal left ventricular filling pattern, with concomitant abnormal relaxation and increased filling pressure (grade 2 diastolic dysfunction).  Consultations:  Cardiology  Discharge Exam: Filed Vitals:   01/05/14 1532  BP: 146/72  Pulse: 80  Temp:   Resp:     General: No acute distress, awake and alert Cardiovascular: Regular rate and rhythm, normal S1S2 Respiratory: Normal inspiratory effort, having a few bibasilar crackles.  Abdomen: Soft nontender nondistended  Discharge Instructions You were cared for by a hospitalist during your hospital stay. If you have any questions  about your discharge medications or the care you received while you were in the hospital after you are discharged, you can call the unit and asked to speak with the hospitalist on call if the hospitalist that took care of you is not available. Once you are discharged, your primary care physician will handle any further medical  issues. Please note that NO REFILLS for any discharge medications will be authorized once you are discharged, as it is imperative that you return to your primary care physician (or establish a relationship with a primary care physician if you do not have one) for your aftercare needs so that they can reassess your need for medications and monitor your lab values.  Discharge Instructions   (HEART FAILURE PATIENTS) Call MD:  Anytime you have any of the following symptoms: 1) 3 pound weight gain in 24 hours or 5 pounds in 1 week 2) shortness of breath, with or without a dry hacking cough 3) swelling in the hands, feet or stomach 4) if you have to sleep on extra pillows at night in order to breathe.    Complete by:  As directed      Call MD for:  difficulty breathing, headache or visual disturbances    Complete by:  As directed      Call MD for:  extreme fatigue    Complete by:  As directed      Call MD for:  persistant dizziness or light-headedness    Complete by:  As directed      Call MD for:  persistant nausea and vomiting    Complete by:  As directed      Call MD for:  redness, tenderness, or signs of infection (pain, swelling, redness, odor or green/yellow discharge around incision site)    Complete by:  As directed      Call MD for:  severe uncontrolled pain    Complete by:  As directed      Call MD for:  temperature >100.4    Complete by:  As directed      Diet - low sodium heart healthy    Complete by:  As directed      Increase activity slowly    Complete by:  As directed             Medication List         aspirin 81 MG chewable tablet  Chew 1 tablet (81 mg total) by mouth daily.     atorvastatin 10 MG tablet  Commonly known as:  LIPITOR  Take 1 tablet (10 mg total) by mouth daily at 6 PM.     carvedilol 6.25 MG tablet  Commonly known as:  COREG  Take 1 tablet (6.25 mg total) by mouth 2 (two) times daily with a meal.     furosemide 40 MG tablet  Commonly known as:   LASIX  Please take 40 mg PO twice daily for 1 week, then 40 PO q daily thereafter     Melatonin 5 MG Tabs  Take 15 mg by mouth at bedtime.     nitroGLYCERIN 0.4 MG SL tablet  Commonly known as:  NITROSTAT  Place 0.4 mg under the tongue every 5 (five) minutes as needed for chest pain.     potassium chloride SA 20 MEQ tablet  Commonly known as:  K-DUR,KLOR-CON  Take 1 tablet (20 mEq total) by mouth daily.  Start taking on:  01/06/2014       No Known Allergies  Follow-up Information   Follow up with CLEGG,AMY, NP On 01/11/2014. (at 9:45 Heart Failure Clinic at [facility] on the 1st floor . Garage Code [phone])    Specialty:  Nurse Practitioner   Contact information:   [address] [place] [address] [phone]        The results of significant diagnostics from this hospitalization (including imaging, microbiology, ancillary and laboratory) are listed below for reference.    Significant Diagnostic Studies: Dg Chest Port 1 View  01/04/2014   CLINICAL DATA:  Shortness of breath  EXAM: PORTABLE CHEST - 1 VIEW  COMPARISON:  10/28/2012  FINDINGS: Cardiac enlargement with pulmonary vascular congestion. Perihilar airspace infiltrates consistent with edema. Bilateral pleural effusions with basilar atelectasis. No pneumothorax. Postoperative changes in the mediastinum.  IMPRESSION: Congestive changes with cardiac enlargement, pulmonary vascular congestion, pulmonary edema, and bilateral effusions.   Electronically Signed   By: Burman Nieves M.D.   On: 01/04/2014 05:19   Dg Abd 2 Views  01/04/2014   CLINICAL DATA:  Abdominal pain  EXAM: ABDOMEN - 2 VIEW  COMPARISON:  October 29, 2012  FINDINGS: Supine and upright images were obtained. There is diffuse stool throughout colon. Overall the bowel gas pattern is unremarkable. No obstruction or free air. Liver appears prominent. There are stents in each common iliac artery.  IMPRESSION: Diffuse stool throughout colon. Bowel gas pattern  unremarkable. Liver appears prominent.   Electronically Signed   By: Bretta Bang M.D.   On: 01/04/2014 08:02    Microbiology: No results found for this or any previous visit (from the past 240 hour(s)).   Labs: Basic Metabolic Panel:  Recent Labs Lab 01/04/14 0450 01/05/14 0700 01/05/14 1032  NA 143 143 140  K 4.0 4.3 5.1  CL 104 101 99  CO2 26 29 32  GLUCOSE 104* 90 80  BUN CREATININE 1.56* 1.60* 1.64*  CALCIUM 9.8 9.0 9.9   Liver Function Tests: No results found for this basename: AST, ALT, ALKPHOS, BILITOT, PROT, ALBUMIN,  in the last 168 hours No results found for this basename: LIPASE, AMYLASE,  in the last 168 hours No results found for this basename: AMMONIA,  in the last 168 hours CBC:  Recent Labs Lab 01/04/14 0450  WBC 8.3  HGB 14.8  HCT 43.9  MCV 87.8  PLT 148*   Cardiac Enzymes:  Recent Labs Lab 01/04/14 0450  TROPONINI <0.30   BNP: BNP (last 3 results)  Recent Labs  01/04/14 0450  PROBNP 3786.0*   CBG: No results found for this basename: GLUCAP,  in the last 168 hours     Signed:  Jeralyn Bennett  [facility] 01/05/2014, 4:05 PM

## 2014-01-05 NOTE — Progress Notes (Signed)
SATURATION QUALIFICATIONS: (This note is used to comply with regulatory documentation for home oxygen)  Patient Saturations on Room Air at Rest = 100%  Patient Saturations on Room Air while Ambulating = 97%   

## 2014-01-11 ENCOUNTER — Inpatient Hospital Stay (HOSPITAL_COMMUNITY): Payer: Medicare PPO

## 2014-04-17 ENCOUNTER — Ambulatory Visit: Payer: Medicare PPO | Admitting: Interventional Cardiology

## 2014-04-20 ENCOUNTER — Encounter (HOSPITAL_COMMUNITY): Payer: Self-pay | Admitting: Interventional Cardiology

## 2014-04-21 ENCOUNTER — Encounter (HOSPITAL_COMMUNITY): Payer: Self-pay | Admitting: *Deleted

## 2014-04-21 ENCOUNTER — Inpatient Hospital Stay (HOSPITAL_COMMUNITY)
Admission: EM | Admit: 2014-04-21 | Discharge: 2014-04-24 | DRG: 291 | Disposition: A | Discharge status: Home / Self Care | Payer: Medicare PPO | Attending: Internal Medicine | Admitting: Internal Medicine

## 2014-04-21 ENCOUNTER — Emergency Department (HOSPITAL_COMMUNITY): Payer: Medicare PPO

## 2014-04-21 DIAGNOSIS — I509 Heart failure, unspecified: Secondary | ICD-10-CM

## 2014-04-21 DIAGNOSIS — Z951 Presence of aortocoronary bypass graft: Secondary | ICD-10-CM | POA: Diagnosis not present

## 2014-04-21 DIAGNOSIS — I5041 Acute combined systolic (congestive) and diastolic (congestive) heart failure: Secondary | ICD-10-CM

## 2014-04-21 DIAGNOSIS — I129 Hypertensive chronic kidney disease with stage 1 through stage 4 chronic kidney disease, or unspecified chronic kidney disease: Secondary | ICD-10-CM | POA: Diagnosis present

## 2014-04-21 DIAGNOSIS — I252 Old myocardial infarction: Secondary | ICD-10-CM

## 2014-04-21 DIAGNOSIS — I2510 Atherosclerotic heart disease of native coronary artery without angina pectoris: Secondary | ICD-10-CM | POA: Diagnosis present

## 2014-04-21 DIAGNOSIS — Z9119 Patient's noncompliance with other medical treatment and regimen: Secondary | ICD-10-CM | POA: Diagnosis present

## 2014-04-21 DIAGNOSIS — J9620 Acute and chronic respiratory failure, unspecified whether with hypoxia or hypercapnia: Secondary | ICD-10-CM | POA: Diagnosis present

## 2014-04-21 DIAGNOSIS — F1290 Cannabis use, unspecified, uncomplicated: Secondary | ICD-10-CM | POA: Diagnosis present

## 2014-04-21 DIAGNOSIS — I5043 Acute on chronic combined systolic (congestive) and diastolic (congestive) heart failure: Secondary | ICD-10-CM | POA: Diagnosis present

## 2014-04-21 DIAGNOSIS — Z7982 Long term (current) use of aspirin: Secondary | ICD-10-CM | POA: Diagnosis not present

## 2014-04-21 DIAGNOSIS — I4891 Unspecified atrial fibrillation: Secondary | ICD-10-CM | POA: Diagnosis present

## 2014-04-21 DIAGNOSIS — E785 Hyperlipidemia, unspecified: Secondary | ICD-10-CM | POA: Diagnosis present

## 2014-04-21 DIAGNOSIS — I480 Paroxysmal atrial fibrillation: Secondary | ICD-10-CM | POA: Diagnosis present

## 2014-04-21 DIAGNOSIS — N183 Chronic kidney disease, stage 3 (moderate): Secondary | ICD-10-CM | POA: Diagnosis present

## 2014-04-21 DIAGNOSIS — Z8673 Personal history of transient ischemic attack (TIA), and cerebral infarction without residual deficits: Secondary | ICD-10-CM

## 2014-04-21 DIAGNOSIS — K219 Gastro-esophageal reflux disease without esophagitis: Secondary | ICD-10-CM | POA: Diagnosis present

## 2014-04-21 DIAGNOSIS — I5033 Acute on chronic diastolic (congestive) heart failure: Secondary | ICD-10-CM

## 2014-04-21 DIAGNOSIS — J449 Chronic obstructive pulmonary disease, unspecified: Secondary | ICD-10-CM | POA: Diagnosis present

## 2014-04-21 DIAGNOSIS — R0602 Shortness of breath: Secondary | ICD-10-CM | POA: Diagnosis not present

## 2014-04-21 DIAGNOSIS — F17210 Nicotine dependence, cigarettes, uncomplicated: Secondary | ICD-10-CM | POA: Diagnosis present

## 2014-04-21 DIAGNOSIS — J9811 Atelectasis: Secondary | ICD-10-CM | POA: Diagnosis present

## 2014-04-21 DIAGNOSIS — Z66 Do not resuscitate: Secondary | ICD-10-CM | POA: Diagnosis present

## 2014-04-21 DIAGNOSIS — I10 Essential (primary) hypertension: Secondary | ICD-10-CM | POA: Insufficient documentation

## 2014-04-21 DIAGNOSIS — J90 Pleural effusion, not elsewhere classified: Secondary | ICD-10-CM

## 2014-04-21 LAB — I-STAT CG4 LACTIC ACID, ED: LACTIC ACID, VENOUS: 2.4 mmol/L — AB (ref 0.5–2.2)

## 2014-04-21 LAB — BASIC METABOLIC PANEL
ANION GAP: 15 (ref 5–15)
BUN: 29 mg/dL — AB (ref 6–23)
CHLORIDE: 103 meq/L (ref 96–112)
CO2: 21 meq/L (ref 19–32)
CREATININE: 1.47 mg/dL — AB (ref 0.50–1.35)
Calcium: 9.2 mg/dL (ref 8.4–10.5)
GFR calc Af Amer: 52 mL/min — ABNORMAL LOW (ref >90)
GFR calc non Af Amer: 45 mL/min — ABNORMAL LOW (ref >90)
GLUCOSE: 165 mg/dL — AB (ref 70–99)
Potassium: 4.7 mEq/L (ref 3.7–5.3)
Sodium: 139 mEq/L (ref 137–147)

## 2014-04-21 LAB — CBC
HEMATOCRIT: 43.4 % (ref 39.0–52.0)
Hemoglobin: 13.6 g/dL (ref 13.0–17.0)
MCH: 26.9 pg (ref 26.0–34.0)
MCHC: 31.3 g/dL (ref 30.0–36.0)
MCV: 85.9 fL (ref 78.0–100.0)
Platelets: 181 10*3/uL (ref 150–400)
RBC: 5.05 MIL/uL (ref 4.22–5.81)
RDW: 16 % — ABNORMAL HIGH (ref 11.5–15.5)
WBC: 11.6 10*3/uL — AB (ref 4.0–10.5)

## 2014-04-21 LAB — TROPONIN I: Troponin I: <0.3 ng/mL (ref <0.30)

## 2014-04-21 LAB — I-STAT TROPONIN, ED: Troponin i, poc: 0.07 ng/mL (ref 0.00–0.08)

## 2014-04-21 LAB — MAGNESIUM: Magnesium: 2 mg/dL (ref 1.5–2.5)

## 2014-04-21 LAB — PRO B NATRIURETIC PEPTIDE: Pro B Natriuretic peptide (BNP): 13092 pg/mL — ABNORMAL HIGH (ref 0–450)

## 2014-04-21 MED ORDER — IPRATROPIUM BROMIDE 0.02 % IN SOLN
0.5000 mg | Freq: Once | RESPIRATORY_TRACT | Status: AC
  Administered 2014-04-21: 0.5 mg via RESPIRATORY_TRACT
  Filled 2014-04-21: qty 2.5

## 2014-04-21 MED ORDER — CARVEDILOL 6.25 MG PO TABS
6.2500 mg | ORAL_TABLET | Freq: Two times a day (BID) | ORAL | Status: DC
  Administered 2014-04-21 – 2014-04-22 (×3): 6.25 mg via ORAL
  Filled 2014-04-21 (×4): qty 1

## 2014-04-21 MED ORDER — COLCHICINE 0.6 MG PO TABS
0.6000 mg | ORAL_TABLET | Freq: Every day | ORAL | Status: DC | PRN
  Filled 2014-04-21: qty 1

## 2014-04-21 MED ORDER — POTASSIUM CHLORIDE CRYS ER 20 MEQ PO TBCR
20.0000 meq | EXTENDED_RELEASE_TABLET | Freq: Every day | ORAL | Status: DC
  Administered 2014-04-22 – 2014-04-24 (×3): 20 meq via ORAL
  Filled 2014-04-21 (×3): qty 1

## 2014-04-21 MED ORDER — FUROSEMIDE 10 MG/ML IJ SOLN
40.0000 mg | INTRAMUSCULAR | Status: AC
  Administered 2014-04-21: 40 mg via INTRAVENOUS
  Filled 2014-04-21: qty 4

## 2014-04-21 MED ORDER — TIOTROPIUM BROMIDE MONOHYDRATE 18 MCG IN CAPS
18.0000 ug | ORAL_CAPSULE | Freq: Every day | RESPIRATORY_TRACT | Status: DC
  Administered 2014-04-22 – 2014-04-24 (×3): 18 ug via RESPIRATORY_TRACT
  Filled 2014-04-21: qty 5

## 2014-04-21 MED ORDER — BUPROPION HCL ER (XL) 300 MG PO TB24
300.0000 mg | ORAL_TABLET | Freq: Every day | ORAL | Status: DC
  Administered 2014-04-22 – 2014-04-24 (×3): 300 mg via ORAL
  Filled 2014-04-21 (×3): qty 1

## 2014-04-21 MED ORDER — HEPARIN SODIUM (PORCINE) 5000 UNIT/ML IJ SOLN
5000.0000 [IU] | Freq: Three times a day (TID) | INTRAMUSCULAR | Status: DC
  Administered 2014-04-21 – 2014-04-24 (×8): 5000 [IU] via SUBCUTANEOUS
  Filled 2014-04-21 (×10): qty 1

## 2014-04-21 MED ORDER — FUROSEMIDE 10 MG/ML IJ SOLN
40.0000 mg | Freq: Two times a day (BID) | INTRAMUSCULAR | Status: DC
  Administered 2014-04-22: 40 mg via INTRAVENOUS
  Filled 2014-04-21 (×3): qty 4

## 2014-04-21 MED ORDER — SPIRONOLACTONE 25 MG PO TABS
25.0000 mg | ORAL_TABLET | Freq: Every day | ORAL | Status: DC
  Administered 2014-04-21 – 2014-04-24 (×4): 25 mg via ORAL
  Filled 2014-04-21 (×4): qty 1

## 2014-04-21 MED ORDER — SODIUM CHLORIDE 0.9 % IJ SOLN: 3.0000 mL | INTRAMUSCULAR | Status: DC | PRN

## 2014-04-21 MED ORDER — ATORVASTATIN CALCIUM 10 MG PO TABS
10.0000 mg | ORAL_TABLET | Freq: Every day | ORAL | Status: DC
  Administered 2014-04-21 – 2014-04-23 (×3): 10 mg via ORAL
  Filled 2014-04-21 (×4): qty 1

## 2014-04-21 MED ORDER — ACETAMINOPHEN 325 MG PO TABS: 650.0000 mg | ORAL_TABLET | ORAL | Status: DC | PRN

## 2014-04-21 MED ORDER — ALBUTEROL SULFATE (2.5 MG/3ML) 0.083% IN NEBU
5.0000 mg | INHALATION_SOLUTION | Freq: Once | RESPIRATORY_TRACT | Status: AC
  Administered 2014-04-21: 5 mg via RESPIRATORY_TRACT
  Filled 2014-04-21: qty 6

## 2014-04-21 MED ORDER — LOSARTAN POTASSIUM 25 MG PO TABS
25.0000 mg | ORAL_TABLET | Freq: Every day | ORAL | Status: DC
Start: 2014-04-21 — End: 2014-04-24
  Administered 2014-04-21 – 2014-04-24 (×4): 25 mg via ORAL
  Filled 2014-04-21 (×4): qty 1

## 2014-04-21 MED ORDER — SODIUM CHLORIDE 0.9 % IV SOLN: 250.0000 mL | INTRAVENOUS | Status: DC | PRN

## 2014-04-21 MED ORDER — ASPIRIN 81 MG PO CHEW
81.0000 mg | CHEWABLE_TABLET | Freq: Every day | ORAL | Status: DC
  Administered 2014-04-21 – 2014-04-24 (×4): 81 mg via ORAL
  Filled 2014-04-21 (×4): qty 1

## 2014-04-21 MED ORDER — ONDANSETRON HCL 4 MG/2ML IJ SOLN
4.0000 mg | Freq: Four times a day (QID) | INTRAMUSCULAR | Status: DC | PRN
Start: 2014-04-21 — End: 2014-04-24

## 2014-04-21 MED ORDER — SODIUM CHLORIDE 0.9 % IJ SOLN
3.0000 mL | Freq: Two times a day (BID) | INTRAMUSCULAR | Status: DC
  Administered 2014-04-21 – 2014-04-24 (×6): 3 mL via INTRAVENOUS

## 2014-04-21 MED ORDER — NITROGLYCERIN 0.4 MG SL SUBL: 0.4000 mg | SUBLINGUAL_TABLET | SUBLINGUAL | Status: DC | PRN

## 2014-04-21 NOTE — ED Notes (Signed)
Pt reports not feeling well for several days, having sob and tachycardia. spo2 88% at triage. Reports recent productive cough. ekg done at triage.

## 2014-04-21 NOTE — ED Notes (Signed)
Admitting MD in to assess pt for admission.  Ginger Ale given to pt per MD

## 2014-04-21 NOTE — Consult Note (Addendum)
Referring Physician: Cena Benton Reason for Consultation: HF   HPI:  Mr. Nathan [name] is a 73 y.o. gentlemen with a cardiac history that includes CAD s/p CABG x4 (LIMA-LAD, SVG-Diag, SVG-OM1 and SVG-PDA) in June 2013, AFL s/p ablation, diastolic heart failure, hypertension. He also has history of severe COPD previously on home O2, chronic renal failure (baseline Cr 1.5-1.7), stroke and GERD.   He was admitted in November 2013 for NSTEMI, acute respiratory failure and altered mental status requiring intubation. He underwent emergent catheterization showed patent SVG-RCA and LIMA-LAD but total occlusion of SVG-OM as well as SVG-diag. Medical therapy was recommended.Weight at d/c was 166. Cr on d/c was 2.1 Echo 11/13; EF 50-55% with mild-mod MR  Admitted in 8/15 with recurrent HF after he stopped taking meds for 3 months. Diuresed and sent home. Echo with EF 45% moderate MR. RV ok. Weight 175. Instructed to f/u in HF Clinic but didn't.   Presents with several day h/o worsening HF.In ED patient had chest x-ray report which reported large right and moderate left pleural effusion with associated passive atelectasis. BNP 13,092. Trop normal. 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 Not watching diet. Ate 1 pound of Honey Baked ham yesterday.    Admitted by [facility]. We are asked to consult   Review of Systems:     Cardiac Review of Systems: {Y] = yes [ ]  = no  Chest Pain [    ]  Resting SOB [ y  ] Exertional SOB  [  y]  Orthopnea Cove.Etienne  ]   Pedal Edema [ y  ]    Palpitations [  ] Syncope  [  ]   Presyncope [   ]  General Review of Systems: [Y] = yes [  ]=no Constitional: recent weight change Cove.Etienne  ]; anorexia [  ]; fatigue [  ]; nausea [  ]; night sweats [  ]; fever [  ]; or chills [  ];                                                                      Eyes : blurred vision [  ]; diplopia [   ]; vision changes [  ];  Amaurosis fugax[  ]; Resp: cough Cove.Etienne  ];  wheezing[  ];   hemoptysis[  ];  PND [ y ];  GI:  gallstones[  ], vomiting[  ];  dysphagia[  ]; melena[  ];  hematochezia [  ]; heartburn[  ];   GU: kidney stones [  ]; hematuria[  ];   dysuria [  ];  nocturia[  ]; incontinence [  ];             Skin: rash, swelling[  ];, hair loss[  ];  peripheral edema[  ];  or itching[  ]; Musculosketetal: myalgias[  ];  joint swelling[  ];  joint erythema[  ];  joint pain[y  ];  back pain[  ];  Heme/Lymph: bruising[  ];  bleeding[  ];  anemia[  ];  Neuro: TIA[  ];  headaches[  ];  stroke[  ];  vertigo[  ];  seizures[  ];   paresthesias[  ];  difficulty walking[  ];  Psych:depression[  ]; anxiety[  ];  Endocrine: diabetes[  ];  thyroid dysfunction[  ];  Other:  Past Medical History  Diagnosis Date  . Aorto-iliac disease     bilat with stent 2007  . CAD (coronary artery disease)     with loop to the circumflex 2000, inferolateral infarction  . Hypertension   . ED (erectile dysfunction)   . COPD (chronic obstructive pulmonary disease)   . Hyperlipidemia   . Stroke   . Shortness of breath   . GERD (gastroesophageal reflux disease)   . Gout   . Paroxysmal atrial flutter     ablation  . Myocardial infarction 2013    Heart Attact     (Not in a hospital admission)     Infusions:   No Known Allergies  History   Social History  . Marital Status: Married    Spouse Name: N/A    Number of Children: N/A  . Years of Education: N/A   Occupational History  . retired    Social History Main Topics  . Smoking status: Current Some Day Smoker -- 1.00 packs/day for 64 years    Types: Cigarettes  . Smokeless tobacco: Never Used  . 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 Drug Use: Yes    Special: Marijuana     Comment: rarely  . Sexual Activity: Not on file   Other Topics Concern  . Not on file   Social History Narrative    Family  History  Problem Relation Age of Onset  . Cancer Mother   . Tuberculosis Mother   . Heart disease Father     He did NOT have it before age 73   Prior to Admission medications   Medication Sig Start Date End Date Taking? Authorizing Provider  albuterol (PROVENTIL HFA;VENTOLIN HFA) 108 (90 BASE) MCG/ACT inhaler Inhale 2 puffs into the lungs every 6 (six) hours as needed for wheezing or shortness of breath.   Yes Historical Provider, MD  aspirin 81 MG chewable tablet Chew 1 tablet (81 mg total) by mouth daily. 01/05/14  Yes Jeralyn BennettEzequiel Zamora, MD  atorvastatin (LIPITOR) 10 MG tablet Take 1 tablet (10 mg total) by mouth daily at 6 PM. 01/05/14  Yes Jeralyn BennettEzequiel Zamora, MD  buPROPion (WELLBUTRIN XL) 300 MG 24 hr tablet Take 300 mg by mouth daily.   Yes Historical Provider, MD  carvedilol (COREG) 6.25 MG tablet Take 1 tablet (6.25 mg total) by mouth 2 (two) times daily with a meal. 01/05/14  Yes Jeralyn BennettEzequiel Zamora, MD  eplerenone (INSPRA) 25 MG tablet Take 12.5 mg by mouth daily.   Yes Historical Provider, MD  furosemide (LASIX) 40 MG tablet Please take 40 mg PO twice daily for 1 week, then 40 PO q daily thereafter Patient taking differently: Take 40 mg by mouth daily.  01/05/14  Yes Jeralyn BennettEzequiel Zamora, MD  Melatonin 5 MG TABS Take 30 mg by mouth at bedtime.    Yes Historical Provider, MD  metoprolol tartrate (LOPRESSOR) 25 MG tablet Take 25 mg by mouth 2 (two) times daily.   Yes Historical Provider, MD  nitroGLYCERIN (NITROSTAT) 0.4 MG SL tablet Place 0.4 mg under the tongue every 5 (five) minutes as needed for chest pain.   Yes Historical Provider, MD  potassium chloride SA (K-DUR,KLOR-CON) 20 MEQ tablet Take 1 tablet (  20 mEq total) by mouth daily. 01/06/14  Yes Jeralyn Bennett, MD  predniSONE (DELTASONE) 20 MG tablet Take 20 mg by mouth daily with breakfast. Take 3 tablets daily for 2 days, then 2 tablets daily for 2 days, then 1 tablet daily for 2 days, then 0.5 tablet daily for 2 days. 04/17/14 04/24/14 Yes  Historical Provider, MD  tiotropium (SPIRIVA) 18 MCG inhalation capsule Place 18 mcg into inhaler and inhale daily.   Yes Historical Provider, MD  colchicine 0.6 MG tablet Take 0.6 mg by mouth daily as needed (for gout flares).     Historical Provider, MD     PHYSICAL EXAM: Filed Vitals:   04/21/14 1800  BP: 115/62  Pulse: 96  Temp:   Resp: 28     Intake/Output Summary (Last 24 hours) at 04/21/14 1832 Last data filed at 04/21/14 1642  Gross per 24 hour  Intake      0 ml  Output    300 ml  Net   -300 ml    General:  Chronically appearing. + mild dyspnea  HEENT: normal Neck: supple. JVP to jaw. Carotids 2+ bilat; no bruits. No lymphadenopathy or thryomegaly appreciated. Cor: PMI nondisplaced. Tachy regular. No rubs, gallops or murmurs. Lungs: decreased at bases. diminished throughout. + crackles Abdomen: soft, nontender, +distended. No hepatosplenomegaly. No bruits or masses. Good bowel sounds. Extremities: no cyanosis, clubbing, rash,2+  edema Neuro: alert & oriented x 3, cranial nerves grossly intact. moves all 4 extremities w/o difficulty. Affect pleasant.  ECG: sinus tach 112 nonspecific ST abnormalities  Results for orders placed or performed during the hospital encounter of 04/21/14 (from the past 24 hour(s))  CBC     Status: Abnormal   Collection Time: 04/21/14  3:03 PM  Result Value Ref Range   WBC 11.6 (H) 4.0 - 10.5 K/uL   RBC 5.05 4.22 - 5.81 MIL/uL   Hemoglobin 13.6 13.0 - 17.0 g/dL   HCT 16.1 09.6 - 04.5 %   MCV 85.9 78.0 - 100.0 fL   MCH 26.9 26.0 - 34.0 pg   MCHC 31.3 30.0 - 36.0 g/dL   RDW 40.9 (H) 81.1 - 91.4 %   Platelets 181 150 - 400 K/uL  Basic metabolic panel     Status: Abnormal   Collection Time: 04/21/14  3:03 PM  Result Value Ref Range   Sodium 139 137 - 147 mEq/L   Potassium 4.7 3.7 - 5.3 mEq/L   Chloride 103 96 - 112 mEq/L   CO2 21 19 - 32 mEq/L   Glucose, Bld 165 (H) 70 - 99 mg/dL   BUN 29 (H) 6 - 23 mg/dL   Creatinine, Ser 7.82 (H)  0.50 - 1.35 mg/dL   Calcium 9.2 8.4 - 95.6 mg/dL   GFR calc non Af Amer 45 (L) >90 mL/min   GFR calc Af Amer 52 (L) >90 mL/min   Anion gap 15 5 - 15  BNP (order ONLY if patient complains of dyspnea/SOB AND you have documented it for THIS visit)     Status: Abnormal   Collection Time: 04/21/14  3:04 PM  Result Value Ref Range   Pro B Natriuretic peptide (BNP) 13092.0 (H) 0 - 450 pg/mL  I-stat troponin, ED (not at [facility])     Status: None   Collection Time: 04/21/14  3:13 PM  Result Value Ref Range   Troponin i, poc 0.07 0.00 - 0.08 ng/mL   Comment 3          I-Stat  CG4 Lactic Acid, ED     Status: Abnormal   Collection Time: 04/21/14  3:24 PM  Result Value Ref Range   Lactic Acid, Venous 2.40 (H) 0.5 - 2.2 mmol/L  Troponin I (q 6hr x 3)     Status: None   Collection Time: 04/21/14  5:28 PM  Result Value Ref Range   Troponin I <0.30 <0.30 ng/mL   Dg Chest Port 1 View  04/21/2014   CLINICAL DATA:  Left chest pain. Shortness of breath. Cough. Congestion. Sixty-four pack-years.  EXAM: PORTABLE CHEST - 1 VIEW  COMPARISON:  01/04/2014  FINDINGS: Large right, moderate left pleural effusion with associated passive atelectasis. Prior edema has essentially resolved. Mild enlargement of the cardiopericardial silhouette atherosclerotic aortic arch. Prior CABG.  IMPRESSION: 1. Prior edema has resolved. There is a large right and moderate left pleural effusion with associated passive atelectasis. 2. Mild enlargement of the cardiopericardial silhouette   Electronically Signed   By: Herbie BaltimoreWalt  Liebkemann M.D.   On: 04/21/2014 15:39     ASSESSMENT: 1) Acute on chronic diastolic heart failure    --EF 45% by echo 8/15 2) Acute on chronic respiratory failure 3) Bilateral pleural effusions  4) CAD s/p CABG 5) COPD with ongoing tobacco use 6) CKD, stage III 7) h/o GI bleed in 2014 - ASA stopped 8) Noncompliance  PLAN/DISCUSSION:  Mr. Kyung RuddKennedy is well known to the HF team. He has recurrent diastolic HF with  significant volume overload in setting of noncompliance. Would diurese with lasix 40mg  IV bid (can go to 80 as needed). Will cut carvedilol to 3.125 bid and eplerenone. Has not been on ACE/ARB due to CKD. Will try low-dose losartan as I don't think he will be complaint with TID hydralazine/nitrates. Will f/u CXR. If effusions don't improved with diuresis may need thoracentesis. He is not interested in smoking cessation.  Truman HaywardDaniel Eric Nees,MD 6:52 PM

## 2014-04-21 NOTE — ED Notes (Signed)
Pt's 02 increased to 3LPM per VO Dr. Bebe ShaggyWickline.

## 2014-04-21 NOTE — H&P (Signed)
[facility] History and Physical  Nathan [name] Lasser ZOX:[id number] DOB: 01/08/1941 DOA: 04/21/2014  Referring physician: Dr. Bebe ShaggyWickline PCP: Lillia MountainGRIFFIN,JOHN JOSEPH, MD   Chief Complaint: SOB  HPI: Nathan [name] Frohlich is a 73 y.o. male  With history of CAD status post CABG, family reports history of MI 4 in the past, hypertension, nicotine dependence, history of noncompliance with cardiac medication per last H&P, lipidemia, chronic kidney disease stage III. Presents to the ED complaining of shortness of breath. The problem has been present for several days. Within that time it has been getting worse. Worse on exertion. Currently nothing he is aware of makes it better.  He denies any recent fevers or chills or any sick contacts. Patient states he's had frothy white sputum production on coughing. He denies any hematemesis.  All in the ED patient had chest x-ray report which reported large right and moderate left pleural effusion with associated passive atelectasis and mild enlargement of cardiopericardial silhouette, BNP was elevated. We were consulted for medical evaluation and recommendations.   Review of Systems:  Constitutional:  No weight loss, night sweats, Fevers, chills, fatigue.  HEENT:  No headaches, Difficulty swallowing,Tooth/dental problems,Sore throat,  No sneezing, itching, ear ache, nasal congestion, post nasal drip,  Cardio-vascular:  No chest pain, Orthopnea, PND, anasarca, dizziness, palpitations  GI:  No heartburn, indigestion, abdominal pain, nausea, vomiting, diarrhea, change in bowel habits, loss of appetite  Resp:  No shortness of breath with exertion or at rest. No excess mucus, + productive cough (frothy white sputum), No non-productive cough, No coughing up of blood.No change in color of mucus.No wheezing.No chest wall deformity  Skin:  no rash or lesions.  GU:  no dysuria, change in color of urine, no urgency or frequency. No flank pain.  Musculoskeletal:  No  joint pain or swelling. No decreased range of motion. No back pain.  Psych:  No change in mood or affect. No depression or anxiety. No memory loss.   Past Medical History  Diagnosis Date  . Aorto-iliac disease     bilat with stent 2007  . CAD (coronary artery disease)     with loop to the circumflex 2000, inferolateral infarction  . Hypertension   . ED (erectile dysfunction)   . COPD (chronic obstructive pulmonary disease)   . Hyperlipidemia   . Stroke   . Shortness of breath   . GERD (gastroesophageal reflux disease)   . Gout   . Paroxysmal atrial flutter     ablation  . Myocardial infarction 2013    Heart Attact   Past Surgical History  Procedure Laterality Date  . Spinal fusion    . Hand surgery    . Neck fusion  1975  . Eye surgery    . Coronary artery bypass graft  10/31/2011    Procedure: CORONARY ARTERY BYPASS GRAFTING (CABG);  Surgeon: Delight OvensEdward B Gerhardt, MD;  [facility];  Service: Open Heart Surgery;  Laterality: N/A;  times three using  Left Internal Mammary Artery and Right Greater Saphenous Vein Graft harvested endoscopically; TEE  . Endarterectomy  10/31/2011    Procedure: ENDARTERECTOMY CAROTID;  Surgeon: Nada LibmanVance [name] Brabham, MD;  [facility];  Service: Vascular;  Laterality: Right;  with patch angioplasty   . Carotid endarterectomy  10-31-2011    right  . Colonoscopy N/A 10/30/2012    Procedure: COLONOSCOPY;  Surgeon: Shirley FriarVincent C. Schooler, MD;  [facility];  Service: Endoscopy;  Laterality: N/A;  . Left heart catheterization with coronary angiogram N/A  09/04/2011    Procedure: LEFT HEART CATHETERIZATION WITH CORONARY ANGIOGRAM;  Surgeon: Lesleigh NoeHenry [name] Smith III, MD;  [facility];  Service: Cardiovascular;  Laterality: N/A;  . Left heart cath N/A 04/03/2012    Procedure: LEFT HEART CATH;  Surgeon: Lennette Biharihomas A Kelly, MD;  [facility];  Service: Cardiovascular;  Laterality: N/A;   Social History:  reports that he has been smoking Cigarettes.   He has a 64 pack-year smoking history. He has never used smokeless tobacco. He reports that he drinks about 3.6 oz of alcohol per week. He reports that he uses illicit drugs (Marijuana).  No Known Allergies  Family History  Problem Relation Age of Onset  . Cancer Mother   . Tuberculosis Mother   . Heart disease Father     He did NOT have it before age 73     Prior to Admission medications   Medication Sig Start Date End Date Taking? Authorizing Provider  albuterol (PROVENTIL HFA;VENTOLIN HFA) 108 (90 BASE) MCG/ACT inhaler Inhale 2 puffs into the lungs every 6 (six) hours as needed for wheezing or shortness of breath.   Yes Historical Provider, MD  aspirin 81 MG chewable tablet Chew 1 tablet (81 mg total) by mouth daily. 01/05/14  Yes Jeralyn BennettEzequiel Zamora, MD  atorvastatin (LIPITOR) 10 MG tablet Take 1 tablet (10 mg total) by mouth daily at 6 PM. 01/05/14  Yes Jeralyn BennettEzequiel Zamora, MD  buPROPion (WELLBUTRIN XL) 300 MG 24 hr tablet Take 300 mg by mouth daily.   Yes Historical Provider, MD  carvedilol (COREG) 6.25 MG tablet Take 1 tablet (6.25 mg total) by mouth 2 (two) times daily with a meal. 01/05/14  Yes Jeralyn BennettEzequiel Zamora, MD  eplerenone (INSPRA) 25 MG tablet Take 12.5 mg by mouth daily.   Yes Historical Provider, MD  furosemide (LASIX) 40 MG tablet Please take 40 mg PO twice daily for 1 week, then 40 PO q daily thereafter Patient taking differently: Take 40 mg by mouth daily.  01/05/14  Yes Jeralyn BennettEzequiel Zamora, MD  Melatonin 5 MG TABS Take 30 mg by mouth at bedtime.    Yes Historical Provider, MD  metoprolol tartrate (LOPRESSOR) 25 MG tablet Take 25 mg by mouth 2 (two) times daily.   Yes Historical Provider, MD  nitroGLYCERIN (NITROSTAT) 0.4 MG SL tablet Place 0.4 mg under the tongue every 5 (five) minutes as needed for chest pain.   Yes Historical Provider, MD  potassium chloride SA (K-DUR,KLOR-CON) 20 MEQ tablet Take 1 tablet (20 mEq total) by mouth daily. 01/06/14  Yes Jeralyn BennettEzequiel Zamora, MD  predniSONE  (DELTASONE) 20 MG tablet Take 20 mg by mouth daily with breakfast. Take 3 tablets daily for 2 days, then 2 tablets daily for 2 days, then 1 tablet daily for 2 days, then 0.5 tablet daily for 2 days. 04/17/14 04/24/14 Yes Historical Provider, MD  tiotropium (SPIRIVA) 18 MCG inhalation capsule Place 18 mcg into inhaler and inhale daily.   Yes Historical Provider, MD  colchicine 0.6 MG tablet Take 0.6 mg by mouth daily as needed (for gout flares).     Historical Provider, MD   Physical Exam: Filed Vitals:   04/21/14 1545 04/21/14 1546 04/21/14 1600 04/21/14 1615  BP: 147/83 147/83 132/94 155/77  Pulse: 106 106 106 104  Temp:  98.4 F (36.9 C)    TempSrc:  Oral    Resp: 32 26 22 44  SpO2: 83% 85% 94% 91%    Wt Readings from Last 3 Encounters:  01/05/14  79.516 kg (175 lb 4.8 oz)  09/23/13 83.008 kg (183 lb)  03/23/13 80.287 kg (177 lb)    General:  Appears calm and comfortable Eyes: PERRL, normal lids, irises & conjunctiva ENT: grossly normal hearing, lips & tongue Neck: no LAD, masses or thyromegaly Cardiovascular: S1 and S2 WNL, no rubs or gallops. Few ectopic beats auscultate Respiratory: Decreased breath sounds at bases, positive rales bilaterally, no wheezes, breath sounds auscultated bilaterally Abdomen: soft, nt, nd Skin: no rash or induration seen on limited exam Musculoskeletal: grossly normal tone BUE/BLE Psychiatric: grossly normal mood and affect, speech fluent and appropriate Neurologic: No facial asymmetry, moves extremities equally, answers questions appropriately           Labs on Admission:  Basic Metabolic Panel:  Recent Labs Lab 04/21/14 1503  NA 139  K 4.7  CL 103  CO2 21  GLUCOSE 165*  BUN 29*  CREATININE 1.47*  CALCIUM 9.2   Liver Function Tests: No results for input(s): AST, ALT, ALKPHOS, BILITOT, PROT, ALBUMIN in the last 168 hours. No results for input(s): LIPASE, AMYLASE in the last 168 hours. No results for input(s): AMMONIA in the last 168  hours. CBC:  Recent Labs Lab 04/21/14 1503  WBC 11.6*  HGB 13.6  HCT 43.4  MCV 85.9  PLT 181   Cardiac Enzymes: No results for input(s): CKTOTAL, CKMB, CKMBINDEX, TROPONINI in the last 168 hours.  BNP (last 3 results)  Recent Labs  01/04/14 0450 04/21/14 1504  PROBNP 3786.0* 13092.0*   CBG: No results for input(s): GLUCAP in the last 168 hours.  Radiological Exams on Admission: Dg Chest Port 1 View  04/21/2014   CLINICAL DATA:  Left chest pain. Shortness of breath. Cough. Congestion. Sixty-four pack-years.  EXAM: PORTABLE CHEST - 1 VIEW  COMPARISON:  01/04/2014  FINDINGS: Large right, moderate left pleural effusion with associated passive atelectasis. Prior edema has essentially resolved. Mild enlargement of the cardiopericardial silhouette atherosclerotic aortic arch. Prior CABG.  IMPRESSION: 1. Prior edema has resolved. There is a large right and moderate left pleural effusion with associated passive atelectasis. 2. Mild enlargement of the cardiopericardial silhouette   Electronically Signed   By: Herbie Baltimore M.D.   On: 04/21/2014 15:39    EKG: Independently reviewed. Sinus Tachycardia with no st elevations   Assessment/Plan Principal Problem:   Acute combined systolic and diastolic congestive heart failure - Troponins every 6 hours 3 -Telemetry monitoring -Continue Lasix 40 mg IV twice a day unless otherwise adjusted by heart failure team. -Consult heart failure team - Daily weights and strict intake and output, restrict fluid intake to 1800 ml per day unless otherwise indicated by heart failure team - Given history of A. fib will continue beta blocker - Continue Aldactone  Active Problems:   CAD (coronary artery disease) - Stable no chest pain reported -Continue statin and aspirin    COPD (chronic obstructive pulmonary disease), gold stage C. - Stable continue home regimen - We'll not prescribe steroids as I suspect this is principally due to acute heart  failure    PAF -Continue beta blocker, not currently in A. fib based on EKG evaluation   HTN - Stable currently on beta blocker  Nicotine dependence -Recommended cessation   Code Status: DNR DVT Prophylaxis:Heparin Family Communication: discussed with patient and family member at bedside Disposition Plan: Telemetry monitoring  Time spent:> 45 minutes  Penny Pia [facility] Pager [phone]

## 2014-04-21 NOTE — ED Notes (Signed)
Pt placed on 2 liters of 02 via [place]

## 2014-04-21 NOTE — ED Provider Notes (Signed)
Patient seen/examined in the Emergency Department in [facility] Patient reports shortness of breath Exam : awake/alert, decreased BS noted bilaterally Plan: pt will need admission due to hypoxia/pleural effusion    Joya Gaskinsonald W Kynadi Dragos, MD 04/21/14 1544

## 2014-04-21 NOTE — ED Notes (Signed)
Attempted report 

## 2014-04-21 NOTE — ED Provider Notes (Signed)
CSN: [id number]     Arrival date & time 04/21/14  1445 History   First MD Initiated Contact with Patient 04/21/14 1453     Chief Complaint  Patient presents with  . Shortness of Breath     (Consider location/radiation/quality/duration/timing/severity/associated sxs/prior Treatment) HPI Comments: History of CAD, hypertension, hyperlipidemia, prior MI, and COPD presents to the emergency department with chief complaint of shortness of breath and cough. Patient states that he has been feeling sick for the past 2 weeks. He reports a cough productive for white sputum. He has not taken his temperature at home, but states that he has felt warm from time to time. Denies any chills. Denies any chest pain or abdominal pain. He states that he does feel short of breath. He has tried using an inhaler with no relief. He is in everyday smoker 75 years.  He is followed by Dr. Kirby Funk, who advised patient to come to the emergency department for worsening shortness of breath.  The history is provided by the patient. No language interpreter was used.    Past Medical History  Diagnosis Date  . Aorto-iliac disease     bilat with stent 2007  . CAD (coronary artery disease)     with loop to the circumflex 2000, inferolateral infarction  . Hypertension   . ED (erectile dysfunction)   . COPD (chronic obstructive pulmonary disease)   . Hyperlipidemia   . Stroke   . Shortness of breath   . GERD (gastroesophageal reflux disease)   . Gout   . Paroxysmal atrial flutter     ablation  . Myocardial infarction 2013    Heart Attact   Past Surgical History  Procedure Laterality Date  . Spinal fusion    . Hand surgery    . Neck fusion  1975  . Eye surgery    . Coronary artery bypass graft  10/31/2011    Procedure: CORONARY ARTERY BYPASS GRAFTING (CABG);  Surgeon: Delight Ovens, MD;  Location: [facility] OR;  Service: Open Heart Surgery;  Laterality: N/A;  times three using  Left Internal Mammary Artery and  Right Greater Saphenous Vein Graft harvested endoscopically; TEE  . Endarterectomy  10/31/2011    Procedure: ENDARTERECTOMY CAROTID;  Surgeon: Nada Libman, MD;  Location: [facility] OR;  Service: Vascular;  Laterality: Right;  with patch angioplasty   . Carotid endarterectomy  10-31-2011    right  . Colonoscopy N/A 10/30/2012    Procedure: COLONOSCOPY;  Surgeon: Shirley Friar, MD;  Location: [facility] ENDOSCOPY;  Service: Endoscopy;  Laterality: N/A;  . Left heart catheterization with coronary angiogram N/A 09/04/2011    Procedure: LEFT HEART CATHETERIZATION WITH CORONARY ANGIOGRAM;  Surgeon: Lesleigh Noe, MD;  Location: [facility] CATH LAB;  Service: Cardiovascular;  Laterality: N/A;  . Left heart cath N/A 04/03/2012    Procedure: LEFT HEART CATH;  Surgeon: Lennette Bihari, MD;  Location: [facility] CATH LAB;  Service: Cardiovascular;  Laterality: N/A;   Family History  Problem Relation Age of Onset  . Cancer Mother   . Tuberculosis Mother   . Heart disease Father     He did NOT have it before age 50   History  Substance Use Topics  . Smoking status: Current Some Day Smoker -- 1.00 packs/day for 75 years    Types: Cigarettes  . Smokeless tobacco: Never Used  . 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  Respiratory: Positive for shortness of breath.   Cardiovascular: Negative for chest pain.  Gastrointestinal: Negative for nausea, vomiting, diarrhea and constipation.  Genitourinary: Negative for dysuria.  All other systems reviewed and are negative.     Allergies  Review of patient's allergies indicates no known allergies.  Home Medications   Prior to Admission medications   Medication Sig Start Date End Date Taking? Authorizing Provider  aspirin 81 MG chewable tablet Chew 1 tablet (81 mg total) by mouth daily.  01/05/14   Jeralyn BennettEzequiel Zamora, MD  atorvastatin (LIPITOR) 10 MG tablet Take 1 tablet (10 mg total) by mouth daily at 6 PM. 01/05/14   Jeralyn BennettEzequiel Zamora, MD  carvedilol (COREG) 6.25 MG tablet Take 1 tablet (6.25 mg total) by mouth 2 (two) times daily with a meal. 01/05/14   Jeralyn BennettEzequiel Zamora, MD  furosemide (LASIX) 40 MG tablet Please take 40 mg PO twice daily for 1 week, then 40 PO q daily thereafter 01/05/14   Jeralyn BennettEzequiel Zamora, MD  Melatonin 5 MG TABS Take 15 mg by mouth at bedtime.    Historical Provider, MD  nitroGLYCERIN (NITROSTAT) 0.4 MG SL tablet Place 0.4 mg under the tongue every 5 (five) minutes as needed for chest pain.    Historical Provider, MD  potassium chloride SA (K-DUR,KLOR-CON) 20 MEQ tablet Take 1 tablet (20 mEq total) by mouth daily. 01/06/14   Jeralyn BennettEzequiel Zamora, MD   BP 162/74 mmHg  Pulse 110  Resp 24  SpO2 88% Physical Exam  Constitutional: He is oriented to person, place, and time. He appears well-developed and well-nourished.  HENT:  Head: Normocephalic and atraumatic.  Eyes: Conjunctivae and EOM are normal. Pupils are equal, round, and reactive to light. Right eye exhibits no discharge. Left eye exhibits no discharge. No scleral icterus.  Neck: Normal range of motion. Neck supple. No JVD present.  Cardiovascular: Normal rate, regular rhythm and normal heart sounds.  Exam reveals no gallop and no friction rub.   No murmur heard. Pulmonary/Chest: Effort normal. No respiratory distress. He has no wheezes. He has rales. He exhibits no tenderness.  Crackles heard diffusely  Abdominal: Soft. He exhibits no distension and no mass. There is no tenderness. There is no rebound and no guarding.  Musculoskeletal: Normal range of motion. He exhibits no edema or tenderness.  Neurological: He is alert and oriented to person, place, and time.  Skin: Skin is warm and dry.  Psychiatric: He has a normal mood and affect. His behavior is normal. Judgment and thought content normal.  Nursing note and  vitals reviewed.   ED Course  Procedures (including critical care time) Labs Review Labs Reviewed - No data to display  Imaging Review No results found.   EKG Interpretation None      MDM   Final diagnoses:  CHF exacerbation    Patient with cough and shortness of breath. Suspicious for pneumonia versus COPD exacerbation. Patient also has plenty of cardiac risk factors and prior MI, however he denies any active chest pain.  Labs remarkable for BNP of 13,000, patient is satting 82-85% on room air. I have given the patient Lasix and nebulizer treatment. Pleural effusion seen on chest x-ray. Patient denies any fevers. I doubt infectious source, and believe this to be CHF exacerbation. Plan for admission to medicine. Patient seen by and discussed with Dr. Bebe ShaggyWickline,  who agrees with the plan.  4:12 PM Patient [facility]  Admission orders to be placed by Dr. Cena BentonVega.    Roxy HorsemanRobert Aubriauna Riner, PA-C 04/21/14 1613  Joya Gaskinsonald W Wickline, MD 04/21/14 [phone]

## 2014-04-22 DIAGNOSIS — I480 Paroxysmal atrial fibrillation: Secondary | ICD-10-CM

## 2014-04-22 DIAGNOSIS — I10 Essential (primary) hypertension: Secondary | ICD-10-CM

## 2014-04-22 DIAGNOSIS — I059 Rheumatic mitral valve disease, unspecified: Secondary | ICD-10-CM

## 2014-04-22 DIAGNOSIS — J438 Other emphysema: Secondary | ICD-10-CM

## 2014-04-22 LAB — BASIC METABOLIC PANEL
Anion gap: 12 (ref 5–15)
BUN: 30 mg/dL — ABNORMAL HIGH (ref 6–23)
CO2: 26 meq/L (ref 19–32)
Calcium: 8.6 mg/dL (ref 8.4–10.5)
Chloride: 102 mEq/L (ref 96–112)
Creatinine, Ser: 1.63 mg/dL — ABNORMAL HIGH (ref 0.50–1.35)
GFR calc non Af Amer: 40 mL/min — ABNORMAL LOW (ref >90)
GFR, EST AFRICAN AMERICAN: 46 mL/min — AB (ref >90)
Glucose, Bld: 92 mg/dL (ref 70–99)
Potassium: 4.2 mEq/L (ref 3.7–5.3)
Sodium: 140 mEq/L (ref 137–147)

## 2014-04-22 LAB — TSH: TSH: 0.478 u[IU]/mL (ref 0.350–4.500)

## 2014-04-22 LAB — TROPONIN I: Troponin I: <0.3 ng/mL (ref <0.30)

## 2014-04-22 MED ORDER — DIPHENHYDRAMINE HCL 25 MG PO CAPS
25.0000 mg | ORAL_CAPSULE | Freq: Once | ORAL | Status: AC
  Administered 2014-04-22: 25 mg via ORAL
  Filled 2014-04-22: qty 1

## 2014-04-22 MED ORDER — FUROSEMIDE 10 MG/ML IJ SOLN
60.0000 mg | Freq: Two times a day (BID) | INTRAMUSCULAR | Status: DC
Start: 2014-04-22 — End: 2014-04-22

## 2014-04-22 MED ORDER — FUROSEMIDE 10 MG/ML IJ SOLN
40.0000 mg | Freq: Two times a day (BID) | INTRAMUSCULAR | Status: DC
  Administered 2014-04-22 – 2014-04-23 (×2): 40 mg via INTRAVENOUS
  Filled 2014-04-22 (×2): qty 4

## 2014-04-22 MED ORDER — FUROSEMIDE 10 MG/ML IJ SOLN: 40.0000 mg | Freq: Two times a day (BID) | INTRAMUSCULAR | Status: DC

## 2014-04-22 MED ORDER — ALBUTEROL SULFATE (2.5 MG/3ML) 0.083% IN NEBU
2.5000 mg | INHALATION_SOLUTION | RESPIRATORY_TRACT | Status: DC | PRN
  Administered 2014-04-22 – 2014-04-23 (×5): 2.5 mg via RESPIRATORY_TRACT
  Filled 2014-04-22 (×5): qty 3

## 2014-04-22 MED ORDER — CARVEDILOL 12.5 MG PO TABS
12.5000 mg | ORAL_TABLET | Freq: Two times a day (BID) | ORAL | Status: DC
Start: 2014-04-23 — End: 2014-04-24
  Administered 2014-04-23 – 2014-04-24 (×3): 12.5 mg via ORAL
  Filled 2014-04-22 (×5): qty 1

## 2014-04-22 NOTE — Progress Notes (Signed)
[facility] PROGRESS NOTE  Allen KellMaurice W Mcenaney NWG:[id number] DOB: 11/05/1940 DOA: 04/21/2014 PCP: Lillia MountainGRIFFIN,JOHN JOSEPH, MD  Assessment/Plan: 1. Acute on chronic systolic congestive heart failure -Transthoracic echocardiogram performed on 04/22/2014 showing ejection fraction of 40-45% -He presented with clinical signs and symptoms consistent with acute CHF -Started on Lasix 40 mg IV twice a day, having a net negative fluid balance of 1.3 L -Patient reports mild improvement today, cardiology following. Troponins were negative 3 sets -Continue IV diuresis  2.  History of paroxysmal atrial fibrillation -Continue carvedilol 6.25 mg by mouth twice a day -Presented in sinus rhythm  3.  Coronary artery disease -Status post coronary artery bypass grafting with history of non-ST segment elevation myocardial infarction, undergoing heart catheterization in November 2013 -Troponins were negative 3 sets overnight -Continue aspirin, beta blocker, statin  4.  Chronic obstructive pulmonary disease -Acute on chronic systolic congestive heart failure likely cause of his shortness of breath, did not have significant wheezing on exam  5.  HTN -Blood pressure stable -Continue Coreg 6.25 mg by mouth twice a day, losartan 25 mg daily  Code Status: DNR Family Communication:  Disposition Plan: Continue IV diuresis, anticipate discharge home when medically stable   Consultants:  Cardiology   HPI/Subjective: Patient is a severe 73-year-old gentleman with a past medical history of coronary artery disease status post coronary artery bypass grafting, hypertension, systolic congestive heart failure with transthoracic echocardiogram performed on 04/22/2014 showing ejection fraction of 40-45%, who presented to the emergency room on 04/21/2014 with complaints of shortness of breath. Initial workup included a chest x-ray which revealed large right and moderate left pleural effusion associated with  atelectasis. He is admitted to telemetry and started on Lasix 40 mg IV twice a day, troponins were cycled and remained negative 3 sets. Cardiology was consulted.  Objective: Filed Vitals:   04/22/14 1108  BP: 113/76  Pulse: 79  Temp: 98 F (36.7 C)  Resp:     Intake/Output Summary (Last 24 hours) at 04/22/14 1322 Last data filed at 04/22/14 1250  Gross per 24 hour  Intake    723 ml  Output   2825 ml  Net  -2102 ml   Filed Weights   04/21/14 1847 04/22/14 0529  Weight: 81.9 kg (180 lb 8.9 oz) 81.647 kg (180 lb)    Exam:   General:  Patient is awake and alert, follows commands, on supplemental oxygen  Cardiovascular: Regular rate and rhythm, normal S1S2  Respiratory: Has bibasilar crackles, mildly dyspneic, no wheezing, on supplemental oxygen  Abdomen: Soft nontender nondistended  Musculoskeletal: no edema  Data Reviewed: Basic Metabolic Panel:  Recent Labs Lab 04/21/14 1503 04/21/14 2314 04/22/14 0332  NA 139  --  140  K 4.7  --  4.2  CL 103  --  102  CO2 21  --  26  GLUCOSE 165*  --  92  BUN 29*  --  30*  CREATININE 1.47*  --  1.63*  CALCIUM 9.2  --  8.6  MG  --  2.0  --    Liver Function Tests: No results for input(s): AST, ALT, ALKPHOS, BILITOT, PROT, ALBUMIN in the last 168 hours. No results for input(s): LIPASE, AMYLASE in the last 168 hours. No results for input(s): AMMONIA in the last 168 hours. CBC:  Recent Labs Lab 04/21/14 1503  WBC 11.6*  HGB 13.6  HCT 43.4  MCV 85.9  PLT 181   Cardiac Enzymes:  Recent Labs Lab 04/21/14 1728 04/21/14 2314 04/22/14 0332  TROPONINI <0.30 <0.30 <0.30   BNP (last 3 results)  Recent Labs  01/04/14 0450 04/21/14 1504  PROBNP 3786.0* 13092.0*   CBG: No results for input(s): GLUCAP in the last 168 hours.  No results found for this or any previous visit (from the past 240 hour(s)).   Studies: Dg Chest Port 1 View  04/21/2014   CLINICAL DATA:  Left chest pain. Shortness of breath. Cough.  Congestion. Sixty-four pack-years.  EXAM: PORTABLE CHEST - 1 VIEW  COMPARISON:  01/04/2014  FINDINGS: Large right, moderate left pleural effusion with associated passive atelectasis. Prior edema has essentially resolved. Mild enlargement of the cardiopericardial silhouette atherosclerotic aortic arch. Prior CABG.  IMPRESSION: 1. Prior edema has resolved. There is a large right and moderate left pleural effusion with associated passive atelectasis. 2. Mild enlargement of the cardiopericardial silhouette   Electronically Signed   By: Herbie BaltimoreWalt  Liebkemann M.D.   On: 04/21/2014 15:39    Scheduled Meds: . aspirin  81 mg Oral Daily  . atorvastatin  10 mg Oral q1800  . buPROPion  300 mg Oral Daily  . carvedilol  6.25 mg Oral BID WC  . furosemide  40 mg Intravenous BID  . heparin  5,000 Units Subcutaneous 3 times per day  . losartan  25 mg Oral Daily  . potassium chloride SA  20 mEq Oral Daily  . sodium chloride  3 mL Intravenous Q12H  . spironolactone  25 mg Oral Daily  . tiotropium  18 mcg Inhalation Daily   Continuous Infusions:   Principal Problem:   Acute combined systolic and diastolic congestive heart failure Active Problems:   CAD (coronary artery disease)   COPD (chronic obstructive pulmonary disease), gold stage C.   Atrial fibrillation with controlled ventricular response   Acute exacerbation of CHF (congestive heart failure)    Time spent: 35 min    Jeralyn BennettZAMORA, Shaquan Puerta  [facility] Pager [phone]. If 7PM-7AM, please contact night-coverage at [email or web address], password [email or web address] 04/22/2014, 1:22 PM  LOS: 1 day

## 2014-04-22 NOTE — Progress Notes (Signed)
Patient ID: Nathan [name], male   DOB: 02/06/1941, 73 y.o.   MRN: [id number]     Subjective:    SOB mildly improved  Objective:   Temp:  [97.5 F (36.4 C)-98.4 F (36.9 C)] 98 F (36.7 C) (12/12 1108) Pulse Rate:  [79-116] 79 (12/12 1108) Resp:  [18-44] 18 (12/12 0529) BP: (113-174)/(62-94) 113/76 mmHg (12/12 1108) SpO2:  [83 %-99 %] 99 % (12/12 1108) Weight:  [180 lb (81.647 kg)-180 lb 8.9 oz (81.9 kg)] 180 lb (81.647 kg) (12/12 0529) Last BM Date: 04/20/14  Filed Weights   04/21/14 1847 04/22/14 0529  Weight: 180 lb 8.9 oz (81.9 kg) 180 lb (81.647 kg)    Intake/Output Summary (Last 24 hours) at 04/22/14 1208 Last data filed at 04/22/14 1123  Gross per 24 hour  Intake    723 ml  Output   2650 ml  Net  -1927 ml    Telemetry: SR and sinus tach  Exam:  General: NAD  Resp: decreased breath sounds bases  Cardiac: RRR, 2/6 systolic murmur at apex,  no JVD  WU:[id number]:abdomen soft, NT, ND  MSK: no LE edema  Neuro: no focal deficits  Psych: appropraite affect  Lab Results:  Basic Metabolic Panel:  Recent Labs Lab 04/21/14 1503 04/21/14 2314 04/22/14 0332  NA 139  --  140  K 4.7  --  4.2  CL 103  --  102  CO2 21  --  26  GLUCOSE 165*  --  92  BUN 29*  --  30*  CREATININE 1.47*  --  1.63*  CALCIUM 9.2  --  8.6  MG  --  2.0  --     Liver Function Tests: No results for input(s): AST, ALT, ALKPHOS, BILITOT, PROT, ALBUMIN in the last 168 hours.  CBC:  Recent Labs Lab 04/21/14 1503  WBC 11.6*  HGB 13.6  HCT 43.4  MCV 85.9  PLT 181    Cardiac Enzymes:  Recent Labs Lab 04/21/14 1728 04/21/14 2314 04/22/14 0332  TROPONINI <0.30 <0.30 <0.30    BNP:  Recent Labs  01/04/14 0450 04/21/14 1504  PROBNP 3786.0* 13092.0*    Coagulation: No results for input(s): INR in the last 168 hours.  ECG:   Medications:   Scheduled Medications: . aspirin  81 mg Oral Daily  . atorvastatin  10 mg Oral q1800  . buPROPion  300 mg Oral Daily  .  carvedilol  6.25 mg Oral BID WC  . furosemide  40 mg Intravenous Q12H  . heparin  5,000 Units Subcutaneous 3 times per day  . losartan  25 mg Oral Daily  . potassium chloride SA  20 mEq Oral Daily  . sodium chloride  3 mL Intravenous Q12H  . spironolactone  25 mg Oral Daily  . tiotropium  18 mcg Inhalation Daily     Infusions:     PRN Medications:  sodium chloride, acetaminophen, albuterol, colchicine, nitroGLYCERIN, ondansetron (ZOFRAN) IV, sodium chloride     Assessment/Plan    1. Acute on chronic systolic and diastolic HF - echo 04/22/14 LVEF 40-45%, moderate LVH. Evidence of elevated LA pressure, moderate MR, moderate RV dysfunction 04/21/14 CXR large right and moderate left effusion - on lasix 40mg  IV bid, negative 1.3 liters yesterday, negative 1.9 liters since admission. Mild uptrend in Cr but within his baseline of 1.4-1.65 - continue IV diuresis today, follow symptoms and effusions      Dina RichJonathan Branch, M.D.

## 2014-04-22 NOTE — Progress Notes (Signed)
*  PRELIMINARY RESULTS* Echocardiogram 2D Echocardiogram has been performed.  Nathan [name], [name] 04/22/2014, 10:18 AM

## 2014-04-22 NOTE — Progress Notes (Signed)
Contacted by short run approx 13 beats of wide complex tach, possible NSVT. Patient short before had run of atach, wide complex may have been SVT with aberrancy. K 4.3, Mg 2.0. Will increase coreg to 12.5 mg bid.    Dominga FerryJ Tildon Silveria MD

## 2014-04-22 NOTE — Progress Notes (Signed)
Patient had questionable 13 beats, patient c/o lightheadedness when getting out of bed, shortness of breath before the episode. V/S stable, MD notified, see order management for new order. Will continue to monitor patient.

## 2014-04-23 ENCOUNTER — Inpatient Hospital Stay (HOSPITAL_COMMUNITY): Payer: Medicare PPO

## 2014-04-23 DIAGNOSIS — J90 Pleural effusion, not elsewhere classified: Secondary | ICD-10-CM | POA: Insufficient documentation

## 2014-04-23 DIAGNOSIS — I4891 Unspecified atrial fibrillation: Secondary | ICD-10-CM

## 2014-04-23 LAB — BASIC METABOLIC PANEL
ANION GAP: 16 — AB (ref 5–15)
BUN: 32 mg/dL — ABNORMAL HIGH (ref 6–23)
CALCIUM: 9 mg/dL (ref 8.4–10.5)
CHLORIDE: 97 meq/L (ref 96–112)
CO2: 28 mEq/L (ref 19–32)
CREATININE: 1.73 mg/dL — AB (ref 0.50–1.35)
GFR calc Af Amer: 43 mL/min — ABNORMAL LOW (ref >90)
GFR, EST NON AFRICAN AMERICAN: 37 mL/min — AB (ref >90)
Glucose, Bld: 89 mg/dL (ref 70–99)
Potassium: 4.1 mEq/L (ref 3.7–5.3)
Sodium: 141 mEq/L (ref 137–147)

## 2014-04-23 LAB — CBC
HCT: 43.9 % (ref 39.0–52.0)
Hemoglobin: 13.6 g/dL (ref 13.0–17.0)
MCH: 26 pg (ref 26.0–34.0)
MCHC: 31 g/dL (ref 30.0–36.0)
MCV: 83.9 fL (ref 78.0–100.0)
PLATELETS: 214 10*3/uL (ref 150–400)
RBC: 5.23 MIL/uL (ref 4.22–5.81)
RDW: 16 % — AB (ref 11.5–15.5)
WBC: 12.1 10*3/uL — ABNORMAL HIGH (ref 4.0–10.5)

## 2014-04-23 LAB — MAGNESIUM: MAGNESIUM: 1.9 mg/dL (ref 1.5–2.5)

## 2014-04-23 MED ORDER — FUROSEMIDE 40 MG PO TABS
40.0000 mg | ORAL_TABLET | Freq: Every day | ORAL | Status: DC
  Administered 2014-04-24: 40 mg via ORAL
  Filled 2014-04-23 (×2): qty 1

## 2014-04-23 MED ORDER — FUROSEMIDE 10 MG/ML IJ SOLN: 40.0000 mg | Freq: Every day | INTRAMUSCULAR | Status: DC

## 2014-04-23 MED ORDER — ZOLPIDEM TARTRATE 5 MG PO TABS
5.0000 mg | ORAL_TABLET | Freq: Once | ORAL | Status: AC
  Administered 2014-04-23: 5 mg via ORAL
  Filled 2014-04-23: qty 1

## 2014-04-23 NOTE — Progress Notes (Addendum)
Patient ID: Allen KellMaurice W Romo, male   DOB: 08/02/1940, 73 y.o.   MRN: [id number]    Subjective:    SOB improving  Objective:   Temp:  [97.7 F (36.5 C)-98.2 F (36.8 C)] 98.2 F (36.8 C) (12/13 0603) Pulse Rate:  [77-84] 84 (12/13 0603) Resp:  [18] 18 (12/13 0603) BP: (110-156)/(60-76) 156/73 mmHg (12/13 0603) SpO2:  [94 %-99 %] 94 % (12/13 0603) Weight:  [177 lb 12.8 oz (80.65 kg)] 177 lb 12.8 oz (80.65 kg) (12/13 0603) Last BM Date: 04/20/14  Filed Weights   04/21/14 1847 04/22/14 0529 04/23/14 0603  Weight: 180 lb 8.9 oz (81.9 kg) 180 lb (81.647 kg) 177 lb 12.8 oz (80.65 kg)    Intake/Output Summary (Last 24 hours) at 04/23/14 0923 Last data filed at 04/23/14 [id number]  Gross per 24 hour  Intake    723 ml  Output   2450 ml  Net  -1727 ml    Telemetry: SR, PVCs  Exam:  General: NAD  Resp: decreased breath sounds bilaterally  Cardiac: RRR, no m/r/g, no JVD  GI: abdomen soft, NT, ND  MSK:no LE edema  Neuro: no focal deficits   Lab Results:  Basic Metabolic Panel:  Recent Labs Lab 04/21/14 1503 04/21/14 2314 04/22/14 0332 04/23/14 0313  NA 139  --  140 141  K 4.7  --  4.2 4.1  CL 103  --  102 97  CO2 21  --  26 28  GLUCOSE 165*  --  92 89  BUN 29*  --  30* 32*  CREATININE 1.47*  --  1.63* 1.73*  CALCIUM 9.2  --  8.6 9.0  MG  --  2.0  --   --     Liver Function Tests: No results for input(s): AST, ALT, ALKPHOS, BILITOT, PROT, ALBUMIN in the last 168 hours.  CBC:  Recent Labs Lab 04/21/14 1503 04/23/14 0313  WBC 11.6* 12.1*  HGB 13.6 13.6  HCT 43.4 43.9  MCV 85.9 83.9  PLT 181 214    Cardiac Enzymes:  Recent Labs Lab 04/21/14 1728 04/21/14 2314 04/22/14 0332  TROPONINI <0.30 <0.30 <0.30    BNP:  Recent Labs  01/04/14 0450 04/21/14 1504  PROBNP 3786.0* 13092.0*    Coagulation: No results for input(s): INR in the last 168 hours.  ECG:   Medications:   Scheduled Medications: . aspirin  81 mg Oral Daily  .  atorvastatin  10 mg Oral q1800  . buPROPion  300 mg Oral Daily  . carvedilol  12.5 mg Oral BID WC  . furosemide  40 mg Intravenous BID  . heparin  5,000 Units Subcutaneous 3 times per day  . losartan  25 mg Oral Daily  . potassium chloride SA  20 mEq Oral Daily  . sodium chloride  3 mL Intravenous Q12H  . spironolactone  25 mg Oral Daily  . tiotropium  18 mcg Inhalation Daily     Infusions:     PRN Medications:  sodium chloride, acetaminophen, albuterol, colchicine, nitroGLYCERIN, ondansetron (ZOFRAN) IV, sodium chloride     Assessment/Plan   1. Acute on chronic systolic and diastolic HF - echo 04/22/14 LVEF 40-45%, moderate LVH. Evidence of elevated LA pressure, moderate MR, moderate RV dysfunction 04/21/14 CXR large right and moderate left effusion - on lasix 40mg  IV bid, negative 2  liters yesterday, negative 3.3 liters since admission. Mild uptrend in Cr, his baseline of 1.4-1.65. Will change lasix to once daily.   - would repeat CXR  tomorrow to see if effusions improving, if not may need to consider thoracentesis. - short run wide complex tach, had some PSVT shortly before. Could be SVT with aberrancy vs NSVT. Eletrolytes have been at goal, his coreg was increased to 12.5mg  bid          Dina RichJonathan Branch, M.D.

## 2014-04-23 NOTE — Progress Notes (Signed)
[facility] PROGRESS NOTE  Nathan KellMaurice W [name] ZOX:[id number] DOB: 08/08/1940 DOA: 04/21/2014 PCP: Lillia MountainGRIFFIN,JOHN JOSEPH, MD  Assessment/Plan: 1. Acute on chronic systolic congestive heart failure -Transthoracic echocardiogram performed on 04/22/2014 showing ejection fraction of 40-45% -He presented with clinical signs and symptoms consistent with acute CHF -Started on Lasix 40 mg IV twice a day, having a net negative fluid balance of 2.9 L -Patient reports mild improvement today, cardiology following. Troponins were negative 3 sets -Will transition to oral lasix 40 mg PO q daily -Repeat CXR in am -Clinically improved  2.  History of paroxysmal atrial fibrillation -Coreg increased to 12.5 mg PO BID -Presented in sinus rhythm  3.  Coronary artery disease -Status post coronary artery bypass grafting with history of non-ST segment elevation myocardial infarction, undergoing heart catheterization in November 2013 -Troponins were negative 3 sets overnight -Continue aspirin, beta blocker, statin  4.  Chronic obstructive pulmonary disease -Acute on chronic systolic congestive heart failure likely cause of his shortness of breath, did not have significant wheezing on exam  5.  HTN -Blood pressure stable -Continue Coreg 6.25 mg by mouth twice a day, losartan 25 mg daily  Code Status: DNR Family Communication:  Disposition Plan:  Anticipate discharge home when medically stable   Consultants:  Cardiology   HPI/Subjective: Patient is a severe 73-year-old gentleman with a past medical history of coronary artery disease status post coronary artery bypass grafting, hypertension, systolic congestive heart failure with transthoracic echocardiogram performed on 04/22/2014 showing ejection fraction of 40-45%, who presented to the emergency room on 04/21/2014 with complaints of shortness of breath. Initial workup included a chest x-ray which revealed large right and moderate left pleural  effusion associated with atelectasis. He is admitted to telemetry and started on Lasix 40 mg IV twice a day, troponins were cycled and remained negative 3 sets. Cardiology was consulted.  Objective: Filed Vitals:   04/23/14 0955  BP: 108/55  Pulse: 72  Temp:   Resp:     Intake/Output Summary (Last 24 hours) at 04/23/14 1312 Last data filed at 04/23/14 1042  Gross per 24 hour  Intake    963 ml  Output   1850 ml  Net   -887 ml   Filed Weights   04/21/14 1847 04/22/14 0529 04/23/14 0603  Weight: 81.9 kg (180 lb 8.9 oz) 81.647 kg (180 lb) 80.65 kg (177 lb 12.8 oz)    Exam:   General:  Patient is awake and alert, follows commands, on supplemental oxygen  Cardiovascular: Regular rate and rhythm, normal S1S2  Respiratory: Improving lung exam, has persistent bibasilar crackles, off of supplemental oxygen  Abdomen: Soft nontender nondistended  Musculoskeletal: no edema  Data Reviewed: Basic Metabolic Panel:  Recent Labs Lab 04/21/14 1503 04/21/14 2314 04/22/14 0332 04/23/14 0313 04/23/14 1000  NA 139  --  140 141  --   K 4.7  --  4.2 4.1  --   CL 103  --  102 97  --   CO2 21  --  26 28  --   GLUCOSE 165*  --  92 89  --   BUN 29*  --  30* 32*  --   CREATININE 1.47*  --  1.63* 1.73*  --   CALCIUM 9.2  --  8.6 9.0  --   MG  --  2.0  --   --  1.9   Liver Function Tests: No results for input(s): AST, ALT, ALKPHOS, BILITOT, PROT, ALBUMIN in the last 168 hours. No results  for input(s): LIPASE, AMYLASE in the last 168 hours. No results for input(s): AMMONIA in the last 168 hours. CBC:  Recent Labs Lab 04/21/14 1503 04/23/14 0313  WBC 11.6* 12.1*  HGB 13.6 13.6  HCT 43.4 43.9  MCV 85.9 83.9  PLT 181 214   Cardiac Enzymes:  Recent Labs Lab 04/21/14 1728 04/21/14 2314 04/22/14 0332  TROPONINI <0.30 <0.30 <0.30   BNP (last 3 results)  Recent Labs  01/04/14 0450 04/21/14 1504  PROBNP 3786.0* 13092.0*   CBG: No results for input(s): GLUCAP in the  last 168 hours.  No results found for this or any previous visit (from the past 240 hour(s)).   Studies: Dg Chest Port 1 View  04/21/2014   CLINICAL DATA:  Left chest pain. Shortness of breath. Cough. Congestion. Sixty-four pack-years.  EXAM: PORTABLE CHEST - 1 VIEW  COMPARISON:  01/04/2014  FINDINGS: Large right, moderate left pleural effusion with associated passive atelectasis. Prior edema has essentially resolved. Mild enlargement of the cardiopericardial silhouette atherosclerotic aortic arch. Prior CABG.  IMPRESSION: 1. Prior edema has resolved. There is a large right and moderate left pleural effusion with associated passive atelectasis. 2. Mild enlargement of the cardiopericardial silhouette   Electronically Signed   By: Herbie BaltimoreWalt  Liebkemann M.D.   On: 04/21/2014 15:39    Scheduled Meds: . aspirin  81 mg Oral Daily  . atorvastatin  10 mg Oral q1800  . buPROPion  300 mg Oral Daily  . carvedilol  12.5 mg Oral BID WC  . furosemide  40 mg Oral Daily  . heparin  5,000 Units Subcutaneous 3 times per day  . losartan  25 mg Oral Daily  . potassium chloride SA  20 mEq Oral Daily  . sodium chloride  3 mL Intravenous Q12H  . spironolactone  25 mg Oral Daily  . tiotropium  18 mcg Inhalation Daily   Continuous Infusions:   Principal Problem:   Acute combined systolic and diastolic congestive heart failure Active Problems:   CAD (coronary artery disease)   COPD (chronic obstructive pulmonary disease), gold stage C.   Atrial fibrillation with controlled ventricular response   Acute exacerbation of CHF (congestive heart failure)   Essential hypertension   PAF (paroxysmal atrial fibrillation)    Time spent: 25 min    Jeralyn BennettZAMORA, Debralee Braaksma  [facility] Pager [phone]. If 7PM-7AM, please contact night-coverage at [email or web address], password [email or web address] 04/23/2014, 1:12 PM  LOS: 2 days

## 2014-04-24 ENCOUNTER — Encounter (HOSPITAL_COMMUNITY): Payer: Self-pay | Admitting: Physician Assistant

## 2014-04-24 ENCOUNTER — Telehealth: Payer: Self-pay | Admitting: Physician Assistant

## 2014-04-24 DIAGNOSIS — I2510 Atherosclerotic heart disease of native coronary artery without angina pectoris: Secondary | ICD-10-CM | POA: Diagnosis present

## 2014-04-24 LAB — CBC
HCT: 41.9 % (ref 39.0–52.0)
Hemoglobin: 13.3 g/dL (ref 13.0–17.0)
MCH: 26.3 pg (ref 26.0–34.0)
MCHC: 31.7 g/dL (ref 30.0–36.0)
MCV: 82.8 fL (ref 78.0–100.0)
Platelets: 189 10*3/uL (ref 150–400)
RBC: 5.06 MIL/uL (ref 4.22–5.81)
RDW: 15.8 % — AB (ref 11.5–15.5)
WBC: 9 10*3/uL (ref 4.0–10.5)

## 2014-04-24 LAB — BASIC METABOLIC PANEL
ANION GAP: 12 (ref 5–15)
BUN: 32 mg/dL — ABNORMAL HIGH (ref 6–23)
CHLORIDE: 97 meq/L (ref 96–112)
CO2: 27 mEq/L (ref 19–32)
CREATININE: 1.72 mg/dL — AB (ref 0.50–1.35)
Calcium: 9 mg/dL (ref 8.4–10.5)
GFR calc Af Amer: 43 mL/min — ABNORMAL LOW (ref >90)
GFR calc non Af Amer: 37 mL/min — ABNORMAL LOW (ref >90)
Glucose, Bld: 102 mg/dL — ABNORMAL HIGH (ref 70–99)
Potassium: 4.3 mEq/L (ref 3.7–5.3)
SODIUM: 136 meq/L — AB (ref 137–147)

## 2014-04-24 MED ORDER — CARVEDILOL 12.5 MG PO TABS
12.5000 mg | ORAL_TABLET | Freq: Two times a day (BID) | ORAL | Status: DC
Start: 2014-04-24 — End: 2014-06-18

## 2014-04-24 MED ORDER — CARVEDILOL 12.5 MG PO TABS
12.5000 mg | ORAL_TABLET | Freq: Two times a day (BID) | ORAL | Status: DC
Start: 2014-04-24 — End: 2014-04-24

## 2014-04-24 MED ORDER — FUROSEMIDE 40 MG PO TABS: ORAL_TABLET | ORAL | Status: DC

## 2014-04-24 MED ORDER — LOSARTAN POTASSIUM 25 MG PO TABS: 25.0000 mg | ORAL_TABLET | Freq: Every day | ORAL | Status: DC

## 2014-04-24 NOTE — Progress Notes (Signed)
Patient Name: Nathan [name] Date of Encounter: 04/24/2014     Principal Problem:   Acute combined systolic and diastolic congestive heart failure Active Problems:   CAD (coronary artery disease)   COPD (chronic obstructive pulmonary disease), gold stage C.   Atrial fibrillation with controlled ventricular response   Acute exacerbation of CHF (congestive heart failure)   Essential hypertension   PAF (paroxysmal atrial fibrillation)   Pleural effusion    SUBJECTIVE  Breathing not completely to baseline but wants to go home today because he has "things he needs to take care of" . Okay to leave and will be complaint with early follow up in the clinic  CURRENT MEDS . aspirin  81 mg Oral Daily  . atorvastatin  10 mg Oral q1800  . buPROPion  300 mg Oral Daily  . carvedilol  12.5 mg Oral BID WC  . furosemide  40 mg Oral Daily  . heparin  5,000 Units Subcutaneous 3 times per day  . losartan  25 mg Oral Daily  . potassium chloride SA  20 mEq Oral Daily  . sodium chloride  3 mL Intravenous Q12H  . spironolactone  25 mg Oral Daily  . tiotropium  18 mcg Inhalation Daily    OBJECTIVE  Filed Vitals:   04/23/14 1950 04/23/14 2021 04/24/14 0556 04/24/14 0856  BP: 119/59  126/72   Pulse: 82  74   Temp: 98.4 F (36.9 C)  98.1 F (36.7 C)   TempSrc: Oral  Oral   Resp: 18  16   Height:      Weight:   174 lb 6.4 oz (79.107 kg)   SpO2: 91% 91% 94% 91%    Intake/Output Summary (Last 24 hours) at 04/24/14 1045 Last data filed at 04/24/14 1009  Gross per 24 hour  Intake   1280 ml  Output    675 ml  Net    605 ml   Filed Weights   04/22/14 0529 04/23/14 0603 04/24/14 0556  Weight: 180 lb (81.647 kg) 177 lb 12.8 oz (80.65 kg) 174 lb 6.4 oz (79.107 kg)    PHYSICAL EXAM  General: Pleasant, NAD. Neuro: Alert and oriented X 3. Moves all extremities spontaneously. Psych: Normal affect. HEENT:  Normal  Neck: Supple without bruits or JVD. Lungs:  Resp regular and unlabored,  mild crackles at bases Heart: RRR no s3, s4, or murmurs. Abdomen: Soft, non-tender, non-distended, BS + x 4.  Extremities: No LE edema  Accessory Clinical Findings  CBC  Recent Labs  04/23/14 0313 04/24/14 0330  WBC 12.1* 9.0  HGB 13.6 13.3  HCT 43.9 41.9  MCV 83.9 82.8  PLT 214 189   Basic Metabolic Panel  Recent Labs  04/21/14 2314  04/23/14 0313 04/23/14 1000 04/24/14 0330  NA  --   < > 141  --  136*  K  --   < > 4.1  --  4.3  CL  --   < > 97  --  97  CO2  --   < > 28  --  27  GLUCOSE  --   < > 89  --  102*  BUN  --   < > 32*  --  32*  CREATININE  --   < > 1.73*  --  1.72*  CALCIUM  --   < > 9.0  --  9.0  MG 2.0  --   --  1.9  --   < > = values in this  interval not displayed.   Cardiac Enzymes  Recent Labs  04/21/14 1728 04/21/14 2314 04/22/14 0332  TROPONINI <0.30 <0.30 <0.30   Thyroid Function Tests  Recent Labs  04/21/14 2314  TSH 0.478    TELE  NSR with freq PVCs  Radiology/Studies  Dg Chest 2 View  04/23/2014   CLINICAL DATA:  Short of breath for months. History of a pleural effusion, hypertension and COPD.  EXAM: CHEST  2 VIEW  COMPARISON:  04/21/2014  FINDINGS: Moderate right pleural effusion obscures the right hemidiaphragm and portions of the right heart border.  Minimal left pleural effusion.  Right lung base opacity adjacent to the pleural effusion is most likely atelectasis. No convincing pneumonia or pulmonary edema.  Cardiac silhouette is mildly enlarged. Changes from CABG surgery are stable. No mediastinal or hilar masses.  Bony thorax is demineralized but grossly intact.  IMPRESSION: 1. No acute findings. 2. Stable moderate right pleural effusion with associated lung base opacity most likely atelectasis. 3. Minimal left pleural effusion. 4. No pulmonary edema.  Stable cardiomegaly.   Electronically Signed   By: Amie Portland M.D.   On: 04/23/2014 17:45   Dg Chest Port 1 View  04/21/2014   CLINICAL DATA:  Left chest pain. Shortness  of breath. Cough. Congestion. Sixty-four pack-years.  EXAM: PORTABLE CHEST - 1 VIEW  COMPARISON:  01/04/2014  FINDINGS: Large right, moderate left pleural effusion with associated passive atelectasis. Prior edema has essentially resolved. Mild enlargement of the cardiopericardial silhouette atherosclerotic aortic arch. Prior CABG.  IMPRESSION: 1. Prior edema has resolved. There is a large right and moderate left pleural effusion with associated passive atelectasis. 2. Mild enlargement of the cardiopericardial silhouette   Electronically Signed   By: Herbie Baltimore M.D.   On: 04/21/2014 15:39    ASSESSMENT AND PLAN   Patient is a 73 year old gentleman with a past medical history of continued tobacco abuse, CAD s/p CABG x4, hypertension, chronic systolic/diastolic CHF, PAF, PVD s/p aorto-iliac stenting and COPD who presented to the emergency room on 04/21/2014 with complaints of shortness of breath.  Acute on chronic systolic congestive heart failure -- 2D ECHO 04/22/14 LVEF 40-45%, moderate LVH. Evidence of elevated LA pressure, moderate MR, moderate RV dysfunction -- IV diuresis with net neg fluid balance of 2.4L and weight down 6 lbs ( 180--> 174lbs).  -- CXR (04/21/14) w/ large right and moderate left effusion. Repeat CXR (04/23/14) with stable moderate right pleural effusion and minimal left pleural effusion. No pulmonary edema. -- Transitioned to po lasix 40 mg PO qd this AM. Breathing not at baseline but close. Will give him 40mg  BID for 2 more days and then transition him to once daily. BMET and TOC follow up appointment on Friday AM. May want to follow up CXR on Friday  -- Continue BB and ARB  History of paroxysmal atrial fibrillation- s/p ablation -- Coreg increased to 12.5 mg PO BID -- Maintaining NSR  -- No discussion of anticoagulation?  Coronary artery disease- s/p CABG x4 (LIMA-LAD, SVG-Diag, SVG-OM1 and SVG-PDA) in 10/2011; 03/2012 NSTEMI s/p LHC SVG-RCA and LIMA-LAD but total  occlusion of SVG-OM as well as SVG-diag. RX therapy recommended  -- No objective signs of ischemia -- Continue aspirin, beta blocker, statin  Chronic obstructive pulmonary disease -- Stable  NSVT? - short run wide complex tach, had some PSVT shortly before. Could be SVT with aberrancy vs NSVT. Eletrolytes have been at goal -- His coreg was increased to 12.5mg  bid  HTN -- Blood  pressure stable -- Continue Coreg 12.5 mg by mouth twice a day, losartan 25 mg daily   Dispo- IM to discharge today with follow up in [facility] arranged on Friday 04/28/14  Signed, Cline CrockHOMPSON, KATHRYN R PA-C  Pager [phone]  I have seen and evaluated the patient this AM along with Cline CrockHOMPSON, KATHRYN R PA-C. I have reviewed the chart & available data. I agree with her findings, examination as well as impression recommendations.  Known CAD with A on C probably combined HF (EF ~40-45%) with bad baseline COPD.  CXR last PM with improved effusions. Per plan from yesterday - converted to PO lasix.   With some residual volume overload will continue BID PO lasix for ~2 day post d/c (will need f/u CMP to recheck electrolytes & renal fxn)  ON stable CAD/CHF regimen of ARB, Cleda DaubSpiro, & Carvedilol. (no further SVT or NSVT with increased BB) On statin.  Will need close f/u to reassess volume status & renal fxn.  Marykay LexHARDING,Jobe Mutch W, M.D., M.S. Interventional Cardiologist   Pager # [phone]

## 2014-04-24 NOTE — Telephone Encounter (Signed)
New message     [facility]-14 [facility]

## 2014-04-24 NOTE — Discharge Summary (Signed)
Physician Discharge Summary  Nathan [name] ZOX:[id number] DOB: 1940-05-26 DOA: 04/21/2014  PCP: Lillia Mountain, MD  Admit date: 04/21/2014 Discharge date: 04/24/2014  Time spent: 35 minutes  Recommendations for Outpatient Follow-up:  1. Please follow up on BMP on his hospital follow up appointment    Discharge Diagnoses:  Principal Problem:   Acute combined systolic and diastolic congestive heart failure Active Problems:   COPD (chronic obstructive pulmonary disease), gold stage C.   Atrial fibrillation with controlled ventricular response   Essential hypertension   PAF (paroxysmal atrial fibrillation)   Pleural effusion   CAD (coronary artery disease)   Discharge Condition: Stable/Improved  Diet recommendation: Heart Healthy  Filed Weights   04/22/14 0529 04/23/14 0603 04/24/14 0556  Weight: 81.647 kg (180 lb) 80.65 kg (177 lb 12.8 oz) 79.107 kg (174 lb 6.4 oz)    History of present illness:  Nathan [name] is a 73 y.o. male  With history of CAD status post CABG, family reports history of MI 4 in the past, hypertension, nicotine dependence, history of noncompliance with cardiac medication per last H&P, lipidemia, chronic kidney disease stage III. Presents to the ED complaining of shortness of breath. The problem has been present for several days. Within that time it has been getting worse. Worse on exertion. Currently nothing he is aware of makes it better. He denies any recent fevers or chills or any sick contacts. Patient states he's had frothy white sputum production on coughing. He denies any hematemesis.  Hospital Course:  Patient is a 73 year old gentleman with a past medical history of coronary artery disease status post coronary artery bypass grafting, hypertension, systolic congestive heart failure with transthoracic echocardiogram performed on 04/22/2014 showing ejection fraction of 40-45%, who presented to the emergency room on 04/21/2014 with complaints  of shortness of breath. Initial workup included a chest x-ray which revealed large right and moderate left pleural effusion associated with atelectasis. He is admitted to telemetry and started on Lasix 40 mg IV twice a day, troponins were cycled and remained negative 3 sets. Cardiology was consulted.   Acute on chronic systolic congestive heart failure -Transthoracic echocardiogram performed on 04/22/2014 showing ejection fraction of 40-45% -He presented with clinical signs and symptoms consistent with acute CHF -Started on Lasix 40 mg IV twice a day, having a net negative fluid balance of 2.3 L by 04/24/2014 -Patient reporting improvement by 04/24/2014. Repeat CXR showed stable effusions -He was discharged on Lasix 40 mg PO BID x 3 days, followed by 40 mg PO q daily thereafter -Will follow up with Cardiology this friday  2. History of paroxysmal atrial fibrillation -Coreg increased to 12.5 mg PO BID during this hospitalization  3. Coronary artery disease -Status post coronary artery bypass grafting with history of non-ST segment elevation myocardial infarction, undergoing heart catheterization in November 2013 -Troponins were negative 3 sets overnight -Continue aspirin, beta blocker, statin  4. Chronic obstructive pulmonary disease -Acute on chronic systolic congestive heart failure likely cause of his shortness of breath, did not have significant wheezing on exam  5. HTN -Blood pressure stable -Continue Coreg 6.25 mg by mouth twice a day, losartan 25 mg daily   Consultations:  Cardiology  Discharge Exam: Filed Vitals:   04/24/14 0556  BP: 126/72  Pulse: 74  Temp: 98.1 F (36.7 C)  Resp: 16     General: Patient is awake and alert, follows commands, on supplemental oxygen  Cardiovascular: Regular rate and rhythm, normal S1S2  Respiratory: Improving lung exam, has  persistent bibasilar crackles, off of supplemental oxygen  Abdomen: Soft nontender  nondistended  Musculoskeletal: no edema  Discharge Instructions You were cared for by a hospitalist during your hospital stay. If you have any questions about your discharge medications or the care you received while you were in the hospital after you are discharged, you can call the unit and asked to speak with the hospitalist on call if the hospitalist that took care of you is not available. Once you are discharged, your primary care physician will handle any further medical issues. Please note that NO REFILLS for any discharge medications will be authorized once you are discharged, as it is imperative that you return to your primary care physician (or establish a relationship with a primary care physician if you do not have one) for your aftercare needs so that they can reassess your need for medications and monitor your lab values.  Discharge Instructions    (HEART FAILURE PATIENTS) Call MD:  Anytime you have any of the following symptoms: 1) 3 pound weight gain in 24 hours or 5 pounds in 1 week 2) shortness of breath, with or without a dry hacking cough 3) swelling in the hands, feet or stomach 4) if you have to sleep on extra pillows at night in order to breathe.    Complete by:  As directed      Call MD for:  difficulty breathing, headache or visual disturbances    Complete by:  As directed      Call MD for:  extreme fatigue    Complete by:  As directed      Call MD for:  hives    Complete by:  As directed      Call MD for:  persistant dizziness or light-headedness    Complete by:  As directed      Call MD for:  persistant nausea and vomiting    Complete by:  As directed      Call MD for:  redness, tenderness, or signs of infection (pain, swelling, redness, odor or green/yellow discharge around incision site)    Complete by:  As directed      Call MD for:  severe uncontrolled pain    Complete by:  As directed      Call MD for:  temperature >100.4    Complete by:  As directed       Diet - low sodium heart healthy    Complete by:  As directed      Increase activity slowly    Complete by:  As directed           Current Discharge Medication List    START taking these medications   Details  losartan (COZAAR) 25 MG tablet Take 1 tablet (25 mg total) by mouth daily. Qty: 30 tablet, Refills: 1      CONTINUE these medications which have CHANGED   Details  carvedilol (COREG) 12.5 MG tablet Take 1 tablet (12.5 mg total) by mouth 2 (two) times daily with a meal. Qty: 60 tablet, Refills: 1    furosemide (LASIX) 40 MG tablet Please take 40 mg PO twice daily for 3 days, then 40 mg PO q daily thereafter Qty: 30 tablet, Refills: 1      CONTINUE these medications which have NOT CHANGED   Details  albuterol (PROVENTIL HFA;VENTOLIN HFA) 108 (90 BASE) MCG/ACT inhaler Inhale 2 puffs into the lungs every 6 (six) hours as needed for wheezing or shortness of breath.  aspirin 81 MG chewable tablet Chew 1 tablet (81 mg total) by mouth daily. Qty: 30 tablet, Refills: 1    atorvastatin (LIPITOR) 10 MG tablet Take 1 tablet (10 mg total) by mouth daily at 6 PM. Qty: 30 tablet, Refills: 1    buPROPion (WELLBUTRIN XL) 300 MG 24 hr tablet Take 300 mg by mouth daily.    eplerenone (INSPRA) 25 MG tablet Take 12.5 mg by mouth daily.    Melatonin 5 MG TABS Take 30 mg by mouth at bedtime.     nitroGLYCERIN (NITROSTAT) 0.4 MG SL tablet Place 0.4 mg under the tongue every 5 (five) minutes as needed for chest pain.    potassium chloride SA (K-DUR,KLOR-CON) 20 MEQ tablet Take 1 tablet (20 mEq total) by mouth daily. Qty: 30 tablet, Refills: 1    tiotropium (SPIRIVA) 18 MCG inhalation capsule Place 18 mcg into inhaler and inhale daily.    colchicine 0.6 MG tablet Take 0.6 mg by mouth daily as needed (for gout flares).       STOP taking these medications     metoprolol tartrate (LOPRESSOR) 25 MG tablet      predniSONE (DELTASONE) 20 MG tablet        No Known Allergies Follow-up  Information    Follow up with Ronie Spiesayna Dunn, PA-C.   Specialty:  Cardiology   Why:  @ 8:00 am   Contact information:   [address] [place] [address] [phone]        The results of significant diagnostics from this hospitalization (including imaging, microbiology, ancillary and laboratory) are listed below for reference.    Significant Diagnostic Studies: Dg Chest 2 View  04/23/2014   CLINICAL DATA:  Short of breath for months. History of a pleural effusion, hypertension and COPD.  EXAM: CHEST  2 VIEW  COMPARISON:  04/21/2014  FINDINGS: Moderate right pleural effusion obscures the right hemidiaphragm and portions of the right heart border.  Minimal left pleural effusion.  Right lung base opacity adjacent to the pleural effusion is most likely atelectasis. No convincing pneumonia or pulmonary edema.  Cardiac silhouette is mildly enlarged. Changes from CABG surgery are stable. No mediastinal or hilar masses.  Bony thorax is demineralized but grossly intact.  IMPRESSION: 1. No acute findings. 2. Stable moderate right pleural effusion with associated lung base opacity most likely atelectasis. 3. Minimal left pleural effusion. 4. No pulmonary edema.  Stable cardiomegaly.   Electronically Signed   By: Amie Portlandavid  Ormond M.D.   On: 04/23/2014 17:45   Dg Chest Port 1 View  04/21/2014   CLINICAL DATA:  Left chest pain. Shortness of breath. Cough. Congestion. Sixty-four pack-years.  EXAM: PORTABLE CHEST - 1 VIEW  COMPARISON:  01/04/2014  FINDINGS: Large right, moderate left pleural effusion with associated passive atelectasis. Prior edema has essentially resolved. Mild enlargement of the cardiopericardial silhouette atherosclerotic aortic arch. Prior CABG.  IMPRESSION: 1. Prior edema has resolved. There is a large right and moderate left pleural effusion with associated passive atelectasis. 2. Mild enlargement of the cardiopericardial silhouette   Electronically Signed   By: Herbie BaltimoreWalt   Liebkemann M.D.   On: 04/21/2014 15:39    Microbiology: No results found for this or any previous visit (from the past 240 hour(s)).   Labs: Basic Metabolic Panel:  Recent Labs Lab 04/21/14 1503 04/21/14 2314 04/22/14 0332 04/23/14 0313 04/23/14 1000 04/24/14 0330  NA 139  --  140 141  --  136*  K 4.7  --  4.2 4.1  --  4.3  CL 103  --  102 97  --  97  CO2 21  --  26 28  --  27  GLUCOSE 165*  --  92 89  --  102*  BUN 29*  --  30* 32*  --  32*  CREATININE 1.47*  --  1.63* 1.73*  --  1.72*  CALCIUM 9.2  --  8.6 9.0  --  9.0  MG  --  2.0  --   --  1.9  --    Liver Function Tests: No results for input(s): AST, ALT, ALKPHOS, BILITOT, PROT, ALBUMIN in the last 168 hours. No results for input(s): LIPASE, AMYLASE in the last 168 hours. No results for input(s): AMMONIA in the last 168 hours. CBC:  Recent Labs Lab 04/21/14 1503 04/23/14 0313 04/24/14 0330  WBC 11.6* 12.1* 9.0  HGB 13.6 13.6 13.3  HCT 43.4 43.9 41.9  MCV 85.9 83.9 82.8  PLT 181 214 189   Cardiac Enzymes:  Recent Labs Lab 04/21/14 1728 04/21/14 2314 04/22/14 0332  TROPONINI <0.30 <0.30 <0.30   BNP: BNP (last 3 results)  Recent Labs  01/04/14 0450 04/21/14 1504  PROBNP 3786.0* 13092.0*   CBG: No results for input(s): GLUCAP in the last 168 hours.     SignedJeralyn Bennett  [facility] 04/24/2014, 11:41 AM

## 2014-04-25 NOTE — Telephone Encounter (Signed)
TCM call. Patient would like to move his appointment for 12/8 from 8am to later. States it is too early to make it. Advised will work on changing time and call him back.

## 2014-04-25 NOTE — Telephone Encounter (Signed)
Patient contacted [facility]. Patient understands to follow up with provider Lucile Craterana Dunn PA appointment changed to 12/18 at 1030am. Patient understands discharge instructions. Patient understands medications and regiment. Patient understands to bring all medications to this visit.

## 2014-04-28 ENCOUNTER — Ambulatory Visit (INDEPENDENT_AMBULATORY_CARE_PROVIDER_SITE_OTHER): Payer: Medicare PPO | Admitting: Physician Assistant

## 2014-04-28 ENCOUNTER — Encounter: Payer: Self-pay | Admitting: Physician Assistant

## 2014-04-28 VITALS — BP 122/72 | HR 78 | Ht 73.0 in | Wt 181.2 lb

## 2014-04-28 DIAGNOSIS — I4892 Unspecified atrial flutter: Secondary | ICD-10-CM

## 2014-04-28 DIAGNOSIS — I5042 Chronic combined systolic (congestive) and diastolic (congestive) heart failure: Secondary | ICD-10-CM

## 2014-04-28 DIAGNOSIS — I739 Peripheral vascular disease, unspecified: Secondary | ICD-10-CM

## 2014-04-28 DIAGNOSIS — I480 Paroxysmal atrial fibrillation: Secondary | ICD-10-CM

## 2014-04-28 DIAGNOSIS — Z91199 Patient's noncompliance with other medical treatment and regimen due to unspecified reason: Secondary | ICD-10-CM | POA: Insufficient documentation

## 2014-04-28 DIAGNOSIS — Z9119 Patient's noncompliance with other medical treatment and regimen: Secondary | ICD-10-CM

## 2014-04-28 DIAGNOSIS — J449 Chronic obstructive pulmonary disease, unspecified: Secondary | ICD-10-CM

## 2014-04-28 DIAGNOSIS — N183 Chronic kidney disease, stage 3 unspecified: Secondary | ICD-10-CM

## 2014-04-28 DIAGNOSIS — D649 Anemia, unspecified: Secondary | ICD-10-CM | POA: Insufficient documentation

## 2014-04-28 DIAGNOSIS — I779 Disorder of arteries and arterioles, unspecified: Secondary | ICD-10-CM | POA: Insufficient documentation

## 2014-04-28 DIAGNOSIS — I10 Essential (primary) hypertension: Secondary | ICD-10-CM

## 2014-04-28 DIAGNOSIS — I2510 Atherosclerotic heart disease of native coronary artery without angina pectoris: Secondary | ICD-10-CM

## 2014-04-28 DIAGNOSIS — I119 Hypertensive heart disease without heart failure: Secondary | ICD-10-CM | POA: Insufficient documentation

## 2014-04-28 LAB — BASIC METABOLIC PANEL
BUN: 19 mg/dL (ref 6–23)
CO2: 28 mEq/L (ref 19–32)
Calcium: 8.7 mg/dL (ref 8.4–10.5)
Chloride: 103 mEq/L (ref 96–112)
Creatinine, Ser: 1.7 mg/dL — ABNORMAL HIGH (ref 0.4–1.5)
GFR: 43.11 mL/min — AB (ref >60.00)
Glucose, Bld: 107 mg/dL — ABNORMAL HIGH (ref 70–99)
POTASSIUM: 4.8 meq/L (ref 3.5–5.1)
Sodium: 136 mEq/L (ref 135–145)

## 2014-04-28 NOTE — Patient Instructions (Signed)
Your physician has recommended you make the following change in your medication:    START TAKING LASIX  40 MG    LABS TODAY BMET

## 2014-04-28 NOTE — Progress Notes (Addendum)
[address] [place], [place]  [address] Phone: [phone] Fax:  [phone]  Date:  04/28/2014   Patient ID:  Nathan LivingsMaurice W [name], DOB 12/19/1940, MRN [id number]   PCP:  Nathan MountainGRIFFIN,Nathan JOSEPH, MD  Cardiologist: Dr. Katrinka [name]; [facility] - Nathan [name]  History of Present Illness: Nathan KellMaurice W [name] is a 73 y.o. 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  In summary of his complex recent history, he underwent CABGx4 in June 2013 but was readmitted in November 2013 for NSTEMI, acute respiratory failure and altered mental status. He underwent emergent catheterization showed patent SVG-RCA and LIMA-LAD but total occlusion of SVG-OM as well as SVG-diag. Medical therapy was recommended. He was discharged on 04/08/12 and returned with progressive dyspnea/respiratory failure. He would not allow his wife to give him medications 3 days prior to admit on 04/17/12. In the ER his sats did not improve on Bipap and he required intubation, felt to have PNA & CHF.Echo 11/13: EF 50-55% with mild-mod MR. Had LGIB 10/2012 - Colonoscopy performed June 21 showed blood in the colon but no definite source identified, diverticular versus AVM suspected. Aspirin stopped. He was admitted in 8/15 with recurrent HF after he stopped taking meds for 3 months. Echo with EF 45% moderate MR; RV ok. DC Weight was 175lb. He was instructed to f/u in HF Clinic but didn't. He has not been seen in cardiology office since 03/2013.   He was recently admitted 12/11-12/14 with acute on chronic combined CHF. CXR revealed large right and moderate left pleural  effusion associated with atelectasis. He is admitted to telemetry and started on Lasix 40 mg IV twice a day, troponins were cycled and remained negative 3 sets. He was discharged on Lasix 40 mg PO BID x 3 days, followed by 40 mg PO qdaily thereafter. Per notes, he was in NSR during hospitalization. 2D echo 04/22/14: Akinesis of entire inferolateral wall, akinesis base inferior segment, mild hypokinesis base lateral segment, mildly dilated LV, mod LVH, high ventricular filling pressure, aortic sclerosis without stenosis, mod MR, mod dilated LA, RV systolic function moderately reduced.  He comes back in today feeling "much better." He is not quite yet where he used to be but is feeling better every day. He is wearing a heavy sweater and heavy jeans/shoes today. His weight is 181 in clinic but he reports it was 172 at home today which is where it has been since discharge. No CP, SOB, orthopnea, LEE. He has no interest in quitting smoking after 65 years of use.  Recent Labs: 04/21/2014: Pro B Natriuretic peptide (BNP) 13092.0*; TSH 0.478 04/24/2014: Creatinine 1.72*; Hemoglobin 13.3; Potassium 4.3  Wt Readings from Last 3 Encounters:  04/28/14 181 lb 3.2 oz (82.192 kg)  04/24/14 174 lb 6.4 oz (79.107 kg)  01/05/14 175 lb 4.8 oz (79.516 kg)     Past Medical History  Diagnosis Date  . Aorto-iliac disease     a. s/p bilat with stent 2007 - followed by [facility].  . CAD (coronary artery disease)     a. 10/2011 s/p CABG x4 (LIMA-LAD, SVG-Diag, SVG-OM1 and SVG-PDA)  b. 03/2012 NSTEMI s/p  LHC SVG-RCA and LIMA-LAD but total occlusion of SVG-OM as well as SVG-diag. RX therapy recommended   . Hypertension   . ED (erectile dysfunction)   . COPD, severe   . Hyperlipidemia   . Stroke   . GERD (gastroesophageal reflux disease)   . Gout   . Paroxysmal atrial flutter     ablation  . Chronic combined systolic and diastolic CHF (congestive heart failure)     a. Last echo 04/2014: EF 40-45%.  Marland [name]. PAF (paroxysmal  atrial fibrillation)   . CKD (chronic kidney disease), stage III   . Tobacco abuse   . History of noncompliance with medical treatment   . Carotid artery disease     a. RCEA 2013 - followed by [facility].  . Anemia   . Lower GI bleed     a. LGIB 10/2012: Colonoscopy performed June 21 showed blood in the colon but no definite source identified, diverticular versus AVM suspected. Aspirin stopped.  . Mitral regurgitation   . Hypertensive heart disease     Current Outpatient Prescriptions  Medication Sig Dispense Refill  . albuterol (PROVENTIL HFA;VENTOLIN HFA) 108 (90 BASE) MCG/ACT inhaler Inhale 2 puffs into the lungs every 6 (six) hours as needed for wheezing or shortness of breath.    Marland [name]. aspirin 81 MG chewable tablet Chew 1 tablet (81 mg total) by mouth daily. 30 tablet 1  . atorvastatin (LIPITOR) 10 MG tablet Take 1 tablet (10 mg total) by mouth daily at 6 PM. 30 tablet 1  . buPROPion (WELLBUTRIN XL) 300 MG 24 hr tablet Take 300 mg by mouth daily.    . carvedilol (COREG) 12.5 MG tablet Take 1 tablet (12.5 mg total) by mouth 2 (two) times daily with a meal. 60 tablet 1  . eplerenone (INSPRA) 25 MG tablet Take 12.5 mg by mouth daily.    . furosemide (LASIX) 40 MG tablet Please take 40 mg PO twice daily for 3 days, then 40 mg PO q daily thereafter 30 tablet 1  . losartan (COZAAR) 25 MG tablet Take 1 tablet (25 mg total) by mouth daily. 30 tablet 1  . Melatonin 5 MG TABS Take 30 mg by mouth at bedtime.     . nitroGLYCERIN (NITROSTAT) 0.4 MG SL tablet Place 0.4 mg under the tongue every 5 (five) minutes as needed for chest pain.    . potassium chloride SA (K-DUR,KLOR-CON) 20 MEQ tablet Take 1 tablet (20 mEq total) by mouth daily. 30 tablet 1  . tiotropium (SPIRIVA) 18 MCG inhalation capsule Place 18 mcg into inhaler and inhale daily.     No current facility-administered medications for this visit.    Allergies:   Review of patient's allergies indicates no known allergies.   Social History:  The  patient  reports that he has been smoking Cigarettes.  He has a 64 pack-year smoking history. He has never used smokeless tobacco. He reports that he drinks about 3.6 oz of alcohol per week. He reports that he uses illicit drugs (Marijuana).   Family History:  The patient's family history includes Cancer in his mother; Heart disease in his father; Tuberculosis in his mother.  ROS:  Please see the history of present illness.  All other systems reviewed and negative.   PHYSICAL EXAM: VS:  BP 122/72 mmHg  Pulse 78  Ht 6\' 1"  (1.854 m)  Wt 181 lb 3.2 oz (82.192 kg)  BMI 23.91 kg/m2 Well nourished, well developed, in no acute distress, odor of tobacco HEENT: normal Neck:  no JVD, R CEA scar Cardiac:  normal S1, S2; RRR; no murmur Lungs:  Diminished throughout - no wheezes, rales or rhonchi Abd: soft, nontender, no hepatomegaly Ext: no edema Skin: warm and dry Neuro:  moves all extremities spontaneously, no focal abnormalities noted  EKG:  NSR 78bpm, one PVC, prior inferior infarct, nonspecific ST-T changes, similar to hospital tracings  ASSESSMENT AND PLAN:  1. Chronic combined systolic and diastolic CHF/hypertensive heart disease - He remains clinically stable since discharge. I do not believe his weight is up as much as it is by our scale given the type of clothing he has on today. It was 172lbs at home this AM. He avoids salt and reports compliance with meds since dc. He is due to decrease Lasix back to 40mg  daily today. He will notify our office if weight increases to 175lb or greater.  2. H/o prior medication and follow-up noncompliance - reinforced compliance. 3. Paroxysmal atrial fibrillation last documented in 2013 - maintaining NSR. If he has recurrent AF we may have to revisit the discussion surrounding anticoagulation but he has not been on this in the past presumably due to h/o LGIB and prior noncompliance. 4. HTN, controlled. 5. CKD stage III -Will recheck BMET to make sure he is  on the appropriate dose of KCl at present time given his CKD and concomitant Inspra. 6. CAD s/p CABG, as above - no anginal symptoms. 7. Severe COPD - followed by pulm with ongoing tobacco abuse - I have also asked him to consider stopping smoking but he does not appear motivated to do so at this time. 8. PVD - followed by vascular. 9. Reported h/o atrial flutter s/p ablation - see notes on AF.  Dispo: F/u with Dr. Katrinka Blazing as planned in 05/2014.  Signed, Ronie Spies, PA-C  04/28/2014 11:15 AM

## 2014-05-03 ENCOUNTER — Telehealth: Payer: Self-pay | Admitting: *Deleted

## 2014-05-03 NOTE — Telephone Encounter (Signed)
lmptcb x 3 to go over lab results and to advise pt per Ronie Spiesayna Dunn, PA to holdK+ supp and repeat bmet at next ov appt on 05/22/14.

## 2014-05-08 ENCOUNTER — Encounter: Payer: Self-pay | Admitting: *Deleted

## 2014-05-08 ENCOUNTER — Telehealth: Payer: Self-pay | Admitting: *Deleted

## 2014-05-08 DIAGNOSIS — I5033 Acute on chronic diastolic (congestive) heart failure: Secondary | ICD-10-CM

## 2014-05-08 NOTE — Telephone Encounter (Signed)
lmptcb x 4 to go over lab results, lmom detailed message on 12/23 on machine that identified pt by full name; to hold his K+ supp at this time and [facility]. I will send out a letter to pt asking him to call so that we may go over results and med changes with him .

## 2014-05-22 ENCOUNTER — Ambulatory Visit: Payer: Medicare PPO | Admitting: Interventional Cardiology

## 2014-06-06 ENCOUNTER — Emergency Department (HOSPITAL_COMMUNITY): Payer: Medicare PPO

## 2014-06-06 ENCOUNTER — Other Ambulatory Visit: Payer: Self-pay

## 2014-06-06 ENCOUNTER — Emergency Department (HOSPITAL_COMMUNITY)
Admission: EM | Admit: 2014-06-06 | Discharge: 2014-06-06 | Disposition: A | Discharge status: Home / Self Care | Payer: Medicare PPO | Attending: Emergency Medicine | Admitting: Emergency Medicine

## 2014-06-06 ENCOUNTER — Encounter (HOSPITAL_COMMUNITY): Payer: Self-pay | Admitting: Emergency Medicine

## 2014-06-06 DIAGNOSIS — I130 Hypertensive heart and chronic kidney disease with heart failure and stage 1 through stage 4 chronic kidney disease, or unspecified chronic kidney disease: Secondary | ICD-10-CM | POA: Diagnosis not present

## 2014-06-06 DIAGNOSIS — Z8673 Personal history of transient ischemic attack (TIA), and cerebral infarction without residual deficits: Secondary | ICD-10-CM | POA: Diagnosis not present

## 2014-06-06 DIAGNOSIS — Z9889 Other specified postprocedural states: Secondary | ICD-10-CM | POA: Insufficient documentation

## 2014-06-06 DIAGNOSIS — Z8739 Personal history of other diseases of the musculoskeletal system and connective tissue: Secondary | ICD-10-CM | POA: Diagnosis not present

## 2014-06-06 DIAGNOSIS — Z8719 Personal history of other diseases of the digestive system: Secondary | ICD-10-CM | POA: Diagnosis not present

## 2014-06-06 DIAGNOSIS — Z720 Tobacco use: Secondary | ICD-10-CM | POA: Diagnosis not present

## 2014-06-06 DIAGNOSIS — Z7982 Long term (current) use of aspirin: Secondary | ICD-10-CM | POA: Insufficient documentation

## 2014-06-06 DIAGNOSIS — I2510 Atherosclerotic heart disease of native coronary artery without angina pectoris: Secondary | ICD-10-CM | POA: Diagnosis not present

## 2014-06-06 DIAGNOSIS — Z862 Personal history of diseases of the blood and blood-forming organs and certain disorders involving the immune mechanism: Secondary | ICD-10-CM | POA: Diagnosis not present

## 2014-06-06 DIAGNOSIS — Z9119 Patient's noncompliance with other medical treatment and regimen: Secondary | ICD-10-CM | POA: Diagnosis not present

## 2014-06-06 DIAGNOSIS — J441 Chronic obstructive pulmonary disease with (acute) exacerbation: Secondary | ICD-10-CM | POA: Diagnosis not present

## 2014-06-06 DIAGNOSIS — I480 Paroxysmal atrial fibrillation: Secondary | ICD-10-CM | POA: Diagnosis not present

## 2014-06-06 DIAGNOSIS — I5042 Chronic combined systolic (congestive) and diastolic (congestive) heart failure: Secondary | ICD-10-CM | POA: Diagnosis not present

## 2014-06-06 DIAGNOSIS — E785 Hyperlipidemia, unspecified: Secondary | ICD-10-CM | POA: Diagnosis not present

## 2014-06-06 DIAGNOSIS — N183 Chronic kidney disease, stage 3 (moderate): Secondary | ICD-10-CM | POA: Insufficient documentation

## 2014-06-06 DIAGNOSIS — Z79899 Other long term (current) drug therapy: Secondary | ICD-10-CM | POA: Diagnosis not present

## 2014-06-06 DIAGNOSIS — Z87438 Personal history of other diseases of male genital organs: Secondary | ICD-10-CM | POA: Diagnosis not present

## 2014-06-06 DIAGNOSIS — J449 Chronic obstructive pulmonary disease, unspecified: Secondary | ICD-10-CM | POA: Diagnosis present

## 2014-06-06 LAB — BASIC METABOLIC PANEL
Anion gap: 10 (ref 5–15)
BUN: 26 mg/dL — ABNORMAL HIGH (ref 6–23)
CALCIUM: 9.2 mg/dL (ref 8.4–10.5)
CO2: 25 mmol/L (ref 19–32)
Chloride: 104 mmol/L (ref 96–112)
Creatinine, Ser: 1.5 mg/dL — ABNORMAL HIGH (ref 0.50–1.35)
GFR calc Af Amer: 51 mL/min — ABNORMAL LOW (ref >90)
GFR calc non Af Amer: 44 mL/min — ABNORMAL LOW (ref >90)
GLUCOSE: 111 mg/dL — AB (ref 70–99)
Potassium: 4.8 mmol/L (ref 3.5–5.1)
SODIUM: 139 mmol/L (ref 135–145)

## 2014-06-06 LAB — CBC
HEMATOCRIT: 43.5 % (ref 39.0–52.0)
HEMOGLOBIN: 13.8 g/dL (ref 13.0–17.0)
MCH: 26.2 pg (ref 26.0–34.0)
MCHC: 31.7 g/dL (ref 30.0–36.0)
MCV: 82.5 fL (ref 78.0–100.0)
PLATELETS: 201 10*3/uL (ref 150–400)
RBC: 5.27 MIL/uL (ref 4.22–5.81)
RDW: 16.7 % — ABNORMAL HIGH (ref 11.5–15.5)
WBC: 10.4 10*3/uL (ref 4.0–10.5)

## 2014-06-06 LAB — I-STAT TROPONIN, ED: TROPONIN I, POC: 0.02 ng/mL (ref 0.00–0.08)

## 2014-06-06 MED ORDER — AZITHROMYCIN 250 MG PO TABS: ORAL_TABLET | ORAL | Status: DC

## 2014-06-06 MED ORDER — PREDNISONE 10 MG PO TABS: 20.0000 mg | ORAL_TABLET | Freq: Two times a day (BID) | ORAL | Status: DC

## 2014-06-06 MED ORDER — ALBUTEROL SULFATE (2.5 MG/3ML) 0.083% IN NEBU: 2.5000 mg | INHALATION_SOLUTION | RESPIRATORY_TRACT | Status: DC | PRN

## 2014-06-06 MED ORDER — METHYLPREDNISOLONE SODIUM SUCC 125 MG IJ SOLR
125.0000 mg | Freq: Once | INTRAMUSCULAR | Status: AC
  Administered 2014-06-06: 125 mg via INTRAVENOUS
  Filled 2014-06-06: qty 2

## 2014-06-06 MED ORDER — IPRATROPIUM BROMIDE 0.02 % IN SOLN
0.5000 mg | Freq: Once | RESPIRATORY_TRACT | Status: AC
  Administered 2014-06-06: 0.5 mg via RESPIRATORY_TRACT
  Filled 2014-06-06: qty 2.5

## 2014-06-06 MED ORDER — ALBUTEROL SULFATE (2.5 MG/3ML) 0.083% IN NEBU
5.0000 mg | INHALATION_SOLUTION | Freq: Once | RESPIRATORY_TRACT | Status: AC
  Administered 2014-06-06: 5 mg via RESPIRATORY_TRACT
  Filled 2014-06-06: qty 6

## 2014-06-06 NOTE — ED Provider Notes (Signed)
CSN: [id number]     Arrival date & time 06/06/14  1717 History   First MD Initiated Contact with Patient 06/06/14 2030     Chief Complaint  Patient presents with  . COPD     (Consider location/radiation/quality/duration/timing/severity/associated sxs/prior Treatment) HPI Comments: Patient is a 74 year old male with past medical history of coronary artery disease with CABG, COPD, and 65 years of smoking cigarettes. He presents today with intermittent dyspnea for the past month. He recently finished a course of prednisone and has been occasionally using his albuterol inhaler, however this is not helping. He denies any fevers or chills. He denies any productive cough.  Patient is a 74 y.o. male presenting with shortness of breath. The history is provided by the patient.  Shortness of Breath Severity:  Moderate Onset quality:  Gradual Duration:  1 month Timing:  Constant Progression:  Worsening Chronicity:  New Context: activity   Relieved by:  Nothing Worsened by:  Nothing tried Ineffective treatments:  Inhaler   Past Medical History  Diagnosis Date  . Aorto-iliac disease     a. s/p bilat with stent 2007 - followed by [facility].  . CAD (coronary artery disease)     a. 10/2011 s/p CABG x4 (LIMA-LAD, SVG-Diag, SVG-OM1 and SVG-PDA)  b. 03/2012 NSTEMI s/p LHC SVG-RCA and LIMA-LAD but total occlusion of SVG-OM as well as SVG-diag. RX therapy recommended   . Hypertension   . ED (erectile dysfunction)   . COPD, severe   . Hyperlipidemia   . Stroke   . GERD (gastroesophageal reflux disease)   . Gout   . Paroxysmal atrial flutter     ablation  . Chronic combined systolic and diastolic CHF (congestive heart failure)     a. Last echo 04/2014: EF 40-45%.  Marland Kitchen PAF (paroxysmal atrial fibrillation)   . CKD (chronic kidney disease), stage III   . Tobacco abuse   . History of noncompliance with medical treatment   . Carotid artery disease     a. RCEA 2013 - followed by [facility].  . Anemia   . Lower  GI bleed     a. LGIB 10/2012: Colonoscopy performed June 21 showed blood in the colon but no definite source identified, diverticular versus AVM suspected. Aspirin stopped.  . Mitral regurgitation   . Hypertensive heart disease    Past Surgical History  Procedure Laterality Date  . Spinal fusion    . Hand surgery    . Neck fusion  1975  . Eye surgery    . Coronary artery bypass graft  10/31/2011    Procedure: CORONARY ARTERY BYPASS GRAFTING (CABG);  Surgeon: Delight Ovens, MD;  Location: [facility] OR;  Service: Open Heart Surgery;  Laterality: N/A;  times three using  Left Internal Mammary Artery and Right Greater Saphenous Vein Graft harvested endoscopically; TEE  . Endarterectomy  10/31/2011    Procedure: ENDARTERECTOMY CAROTID;  Surgeon: Nada Libman, MD;  Location: [facility] OR;  Service: Vascular;  Laterality: Right;  with patch angioplasty   . Carotid endarterectomy  10-31-2011    right  . Colonoscopy N/A 10/30/2012    Procedure: COLONOSCOPY;  Surgeon: Shirley Friar, MD;  Location: [facility] ENDOSCOPY;  Service: Endoscopy;  Laterality: N/A;  . Left heart catheterization with coronary angiogram N/A 09/04/2011    Procedure: LEFT HEART CATHETERIZATION WITH CORONARY ANGIOGRAM;  Surgeon: Lesleigh Noe, MD;  Location[facility] C[facility] LAB;  Service: Cardiovascular;  Laterality: N/A;  . Left heart cath N/A 04/03/2012    Procedure:  LEFT HEART CATH;  Surgeon: Lennette Bihari, MD;  Location: [facility] CATH LAB;  Service: Cardiovascular;  Laterality: N/A;   Family History  Problem Relation Age of Onset  . Cancer Mother   . Tuberculosis Mother   . Heart disease Father     He did NOT have it before age 14   History  Substance Use Topics  . Smoking status: Current Some Day Smoker -- 1.00 packs/day for 64 years    Types: Cigarettes  . Smokeless tobacco: Never Used  . Alcohol Use: 3.6 oz/week    6 Shots of liquor per week     Comment: previous history of heavy alcohol use 12 pack of beer /day  now 4-5 drinks per  week    Review of Systems  Respiratory: Positive for shortness of breath.   All other systems reviewed and are negative.     Allergies  Review of patient's allergies indicates no known allergies.  Home Medications   Prior to Admission medications   Medication Sig Start Date End Date Taking? Authorizing Provider  albuterol (PROVENTIL HFA;VENTOLIN HFA) 108 (90 BASE) MCG/ACT inhaler Inhale 2 puffs into the lungs every 6 (six) hours as needed for wheezing or shortness of breath.    Historical Provider, MD  aspirin 81 MG chewable tablet Chew 1 tablet (81 mg total) by mouth daily. 01/05/14   Jeralyn Bennett, MD  atorvastatin (LIPITOR) 10 MG tablet Take 1 tablet (10 mg total) by mouth daily at 6 PM. 01/05/14   Jeralyn Bennett, MD  buPROPion (WELLBUTRIN XL) 300 MG 24 hr tablet Take 300 mg by mouth daily.    Historical Provider, MD  carvedilol (COREG) 12.5 MG tablet Take 1 tablet (12.5 mg total) by mouth 2 (two) times daily with a meal. 04/24/14   Jeralyn Bennett, MD  eplerenone (INSPRA) 25 MG tablet Take 12.5 mg by mouth daily.    Historical Provider, MD  furosemide (LASIX) 40 MG tablet Take 40 mg by mouth daily.    Historical Provider, MD  losartan (COZAAR) 25 MG tablet Take 1 tablet (25 mg total) by mouth daily. 04/24/14   Jeralyn Bennett, MD  Melatonin 5 MG TABS Take 30 mg by mouth at bedtime.     Historical Provider, MD  nitroGLYCERIN (NITROSTAT) 0.4 MG SL tablet Place 0.4 mg under the tongue every 5 (five) minutes as needed for chest pain.    Historical Provider, MD  potassium chloride SA (K-DUR,KLOR-CON) 20 MEQ tablet Take 1 tablet (20 mEq total) by mouth daily. 01/06/14   Jeralyn Bennett, MD  tiotropium (SPIRIVA) 18 MCG inhalation capsule Place 18 mcg into inhaler and inhale daily.    Historical Provider, MD   BP 156/83 mmHg  Temp(Src) 97.5 F (36.4 C) (Oral)  Resp 24  Ht 6' (1.829 m)  Wt 183 lb (83.008 kg)  BMI 24.81 kg/m2  SpO2 100% Physical Exam  Constitutional: He is oriented  to person, place, and time. He appears well-developed and well-nourished. No distress.  HENT:  Head: Normocephalic and atraumatic.  Neck: Normal range of motion. Neck supple.  Cardiovascular: Normal rate, regular rhythm and normal heart sounds.   No murmur heard. Pulmonary/Chest: Effort normal. No respiratory distress. He has wheezes.  There are bilateral expiratory rhonchi present.  Abdominal: Soft. Bowel sounds are normal. He exhibits no distension. There is no tenderness.  Musculoskeletal: Normal range of motion. He exhibits no edema.  Lymphadenopathy:    He has no cervical adenopathy.  Neurological: He is alert and oriented to  person, place, and time.  Skin: Skin is warm and dry. He is not diaphoretic.  Nursing note and vitals reviewed.   ED Course  Procedures (including critical care time) Labs Review Labs Reviewed  BASIC METABOLIC PANEL - Abnormal; Notable for the following:    Glucose, Bld 111 (*)    BUN 26 (*)    Creatinine, Ser 1.50 (*)    GFR calc non Af Amer 44 (*)    GFR calc Af Amer 51 (*)    All other components within normal limits  CBC - Abnormal; Notable for the following:    RDW 16.7 (*)    All other components within normal limits  I-STAT TROPOININ, ED    Imaging Review Dg Chest 2 View (if Patient Has Fever And/or Copd)  06/06/2014   CLINICAL DATA:  Chest pain and shortness of breath  EXAM: CHEST  2 VIEW  COMPARISON:  04/23/2014  FINDINGS: Cardiac shadow is stable but mildly enlarged. Postsurgical changes are again seen. Bilateral pleural effusions are noted which appears stable when compare with the prior study. Likely underlying atelectasis is present. No new focal abnormality is seen.  IMPRESSION: Stable bilateral pleural effusions. No new focal abnormality is noted.   Electronically Signed   By: Alcide CleverMark  Lukens M.D.   On: 06/06/2014 19:40     Date: 06/06/2014  Rate: 96  Rhythm: normal sinus rhythm  QRS Axis: normal  Intervals: normal  ST/T Wave  abnormalities: nonspecific T wave changes  Conduction Disutrbances:none  Narrative Interpretation:   Old EKG Reviewed: none available    MDM   Final diagnoses:  None    Patient is a 74 year old male with long-standing history of cigarette use and COPD. He presents with complaints of chest congestion and difficulty breathing and wheezing for the past week. He has been on steroids for several days however is not improving. His chest x-ray does not reveal a pneumonia in his laboratory studies and EKG are otherwise reassuring. He is feeling much better with an albuterol and Atrovent treatment. His saturations are the mid to upper 90s and I believe is appropriate for discharge. He will be given another course of prednisone, antibiotics, and will be given a prescription to arrange for a home nebulizer treatment.    Geoffery Lyonsouglas Brighid Koch, MD 06/06/14 2231

## 2014-06-06 NOTE — Discharge Instructions (Signed)
Zithromax and prednisone as prescribed.  Albuterol nebulizer treatment every 4 hours as needed for wheezing.  Return to the emergency department if your symptoms substantially worsen or change.   Chronic Obstructive Pulmonary Disease Exacerbation Chronic obstructive pulmonary disease (COPD) is a common lung condition in which airflow from the lungs is limited. COPD is a general term that can be used to describe many different lung problems that limit airflow, including chronic bronchitis and emphysema. COPD exacerbations are episodes when breathing symptoms become much worse and require extra treatment. Without treatment, COPD exacerbations can be life threatening, and frequent COPD exacerbations can cause further damage to your lungs. CAUSES   Respiratory infections.   Exposure to smoke.   Exposure to air pollution, chemical fumes, or dust. Sometimes there is no apparent cause or trigger. RISK FACTORS  Smoking cigarettes.  Older age.  Frequent prior COPD exacerbations. SIGNS AND SYMPTOMS   Increased coughing.   Increased thick spit (sputum) production.   Increased wheezing.   Increased shortness of breath.   Rapid breathing.   Chest tightness. DIAGNOSIS  Your medical history, a physical exam, and tests will help your health care provider make a diagnosis. Tests may include:  A chest X-ray.  Basic lab tests.  Sputum testing.  An arterial blood gas test. TREATMENT  Depending on the severity of your COPD exacerbation, you may need to be admitted to a hospital for treatment. Some of the treatments commonly used to treat COPD exacerbations are:   Antibiotic medicines.   Bronchodilators. These are drugs that expand the air passages. They may be given with an inhaler or nebulizer. Spacer devices may be needed to help improve drug delivery.  Corticosteroid medicines.  Supplemental oxygen therapy.  HOME CARE INSTRUCTIONS   Do not smoke. Quitting smoking is  very important to prevent COPD from getting worse and exacerbations from happening as often.  Avoid exposure to all substances that irritate the airway, especially to tobacco smoke.   If you were prescribed an antibiotic medicine, finish it all even if you start to feel better.  Take all medicines as directed by your health care provider.It is important to use correct technique with inhaled medicines.  Drink enough fluids to keep your urine clear or pale yellow (unless you have a medical condition that requires fluid restriction).  Use a cool mist vaporizer. This makes it easier to clear your chest when you cough.   If you have a home nebulizer and oxygen, continue to use them as directed.   Maintain all necessary vaccinations to prevent infections.   Exercise regularly.   Eat a healthy diet.   Keep all follow-up appointments as directed by your health care provider. SEEK IMMEDIATE MEDICAL CARE IF:  You have worsening shortness of breath.   You have trouble talking.   You have severe chest pain.  You have blood in your sputum.  You have a fever.  You have weakness, vomit repeatedly, or faint.   You feel confused.   You continue to get worse. MAKE SURE YOU:   Understand these instructions.  Will watch your condition.  Will get help right away if you are not doing well or get worse. Document Released: 02/23/2007 Document Revised: 09/12/2013 Document Reviewed: 12/31/2012 [facility] Patient Information 2015 [employer] [place]. This information is not intended to replace advice given to you by your health care provider. Make sure you discuss any questions you have with your health care provider.

## 2014-06-06 NOTE — ED Notes (Signed)
Pt [facility] for [facility].  He was seen 1 week ago and was given steroids.  Pt initially felt better but last few days is feeling worse again.  Pt still feeling short of breath and cough, has had same since Christmas.  Pt able to talk in complete sentences, NAD.

## 2014-06-14 ENCOUNTER — Emergency Department (HOSPITAL_COMMUNITY): Payer: Medicare PPO

## 2014-06-14 ENCOUNTER — Encounter (HOSPITAL_COMMUNITY): Payer: Self-pay

## 2014-06-14 ENCOUNTER — Inpatient Hospital Stay (HOSPITAL_COMMUNITY)
Admission: EM | Admit: 2014-06-14 | Discharge: 2014-06-18 | DRG: 191 | Disposition: A | Discharge status: Home Health | Payer: Medicare PPO | Attending: Family Medicine | Admitting: Family Medicine

## 2014-06-14 DIAGNOSIS — N183 Chronic kidney disease, stage 3 unspecified: Secondary | ICD-10-CM | POA: Diagnosis present

## 2014-06-14 DIAGNOSIS — J449 Chronic obstructive pulmonary disease, unspecified: Secondary | ICD-10-CM | POA: Diagnosis present

## 2014-06-14 DIAGNOSIS — Z955 Presence of coronary angioplasty implant and graft: Secondary | ICD-10-CM | POA: Diagnosis not present

## 2014-06-14 DIAGNOSIS — M109 Gout, unspecified: Secondary | ICD-10-CM | POA: Diagnosis present

## 2014-06-14 DIAGNOSIS — Z8249 Family history of ischemic heart disease and other diseases of the circulatory system: Secondary | ICD-10-CM

## 2014-06-14 DIAGNOSIS — Z9119 Patient's noncompliance with other medical treatment and regimen: Secondary | ICD-10-CM | POA: Diagnosis present

## 2014-06-14 DIAGNOSIS — N179 Acute kidney failure, unspecified: Secondary | ICD-10-CM | POA: Diagnosis present

## 2014-06-14 DIAGNOSIS — R0602 Shortness of breath: Secondary | ICD-10-CM | POA: Diagnosis present

## 2014-06-14 DIAGNOSIS — I480 Paroxysmal atrial fibrillation: Secondary | ICD-10-CM | POA: Diagnosis present

## 2014-06-14 DIAGNOSIS — I2510 Atherosclerotic heart disease of native coronary artery without angina pectoris: Secondary | ICD-10-CM | POA: Diagnosis present

## 2014-06-14 DIAGNOSIS — I5042 Chronic combined systolic (congestive) and diastolic (congestive) heart failure: Secondary | ICD-10-CM | POA: Diagnosis present

## 2014-06-14 DIAGNOSIS — J441 Chronic obstructive pulmonary disease with (acute) exacerbation: Secondary | ICD-10-CM | POA: Diagnosis present

## 2014-06-14 DIAGNOSIS — I472 Ventricular tachycardia: Secondary | ICD-10-CM | POA: Diagnosis present

## 2014-06-14 DIAGNOSIS — I110 Hypertensive heart disease with heart failure: Secondary | ICD-10-CM | POA: Diagnosis present

## 2014-06-14 DIAGNOSIS — Z981 Arthrodesis status: Secondary | ICD-10-CM | POA: Diagnosis not present

## 2014-06-14 DIAGNOSIS — E785 Hyperlipidemia, unspecified: Secondary | ICD-10-CM | POA: Diagnosis present

## 2014-06-14 DIAGNOSIS — Z951 Presence of aortocoronary bypass graft: Secondary | ICD-10-CM | POA: Diagnosis not present

## 2014-06-14 DIAGNOSIS — F17210 Nicotine dependence, cigarettes, uncomplicated: Secondary | ICD-10-CM | POA: Diagnosis present

## 2014-06-14 DIAGNOSIS — I252 Old myocardial infarction: Secondary | ICD-10-CM

## 2014-06-14 LAB — COMPREHENSIVE METABOLIC PANEL
ALBUMIN: 3.4 g/dL — AB (ref 3.5–5.2)
ALK PHOS: 64 U/L (ref 39–117)
ALT: 38 U/L (ref 0–53)
AST: 22 U/L (ref 0–37)
Anion gap: 8 (ref 5–15)
BUN: 26 mg/dL — AB (ref 6–23)
CHLORIDE: 104 mmol/L (ref 96–112)
CO2: 28 mmol/L (ref 19–32)
Calcium: 8.6 mg/dL (ref 8.4–10.5)
Creatinine, Ser: 1.41 mg/dL — ABNORMAL HIGH (ref 0.50–1.35)
GFR calc non Af Amer: 47 mL/min — ABNORMAL LOW (ref >90)
GFR, EST AFRICAN AMERICAN: 55 mL/min — AB (ref >90)
GLUCOSE: 90 mg/dL (ref 70–99)
Potassium: 4.3 mmol/L (ref 3.5–5.1)
Sodium: 140 mmol/L (ref 135–145)
Total Bilirubin: 0.9 mg/dL (ref 0.3–1.2)
Total Protein: 6.2 g/dL (ref 6.0–8.3)

## 2014-06-14 LAB — CBC WITH DIFFERENTIAL/PLATELET
BASOS ABS: 0 10*3/uL (ref 0.0–0.1)
Basophils Relative: 0 % (ref 0–1)
EOS ABS: 0.1 10*3/uL (ref 0.0–0.7)
EOS PCT: 1 % (ref 0–5)
HEMATOCRIT: 42.4 % (ref 39.0–52.0)
Hemoglobin: 12.8 g/dL — ABNORMAL LOW (ref 13.0–17.0)
LYMPHS PCT: 7 % — AB (ref 12–46)
Lymphs Abs: 0.8 10*3/uL (ref 0.7–4.0)
MCH: 25.7 pg — AB (ref 26.0–34.0)
MCHC: 30.2 g/dL (ref 30.0–36.0)
MCV: 85 fL (ref 78.0–100.0)
MONO ABS: 0.5 10*3/uL (ref 0.1–1.0)
Monocytes Relative: 5 % (ref 3–12)
NEUTROS ABS: 9.3 10*3/uL — AB (ref 1.7–7.7)
Neutrophils Relative %: 87 % — ABNORMAL HIGH (ref 43–77)
PLATELETS: 171 10*3/uL (ref 150–400)
RBC: 4.99 MIL/uL (ref 4.22–5.81)
RDW: 17 % — AB (ref 11.5–15.5)
WBC: 10.7 10*3/uL — ABNORMAL HIGH (ref 4.0–10.5)

## 2014-06-14 LAB — I-STAT TROPONIN, ED: TROPONIN I, POC: 0.02 ng/mL (ref 0.00–0.08)

## 2014-06-14 LAB — I-STAT CG4 LACTIC ACID, ED: Lactic Acid, Venous: 1.58 mmol/L (ref 0.5–2.0)

## 2014-06-14 MED ORDER — LEVOFLOXACIN IN D5W 500 MG/100ML IV SOLN
500.0000 mg | INTRAVENOUS | Status: DC
  Administered 2014-06-14: 500 mg via INTRAVENOUS
  Filled 2014-06-14: qty 100

## 2014-06-14 MED ORDER — FUROSEMIDE 10 MG/ML IJ SOLN
60.0000 mg | Freq: Once | INTRAMUSCULAR | Status: AC
  Administered 2014-06-14: 60 mg via INTRAVENOUS
  Filled 2014-06-14: qty 8

## 2014-06-14 MED ORDER — FUROSEMIDE 40 MG PO TABS
40.0000 mg | ORAL_TABLET | Freq: Every day | ORAL | Status: DC
  Administered 2014-06-15: 40 mg via ORAL
  Filled 2014-06-14: qty 1

## 2014-06-14 MED ORDER — METHYLPREDNISOLONE SODIUM SUCC 125 MG IJ SOLR
60.0000 mg | Freq: Four times a day (QID) | INTRAMUSCULAR | Status: AC
  Administered 2014-06-14 – 2014-06-16 (×6): 60 mg via INTRAVENOUS
  Filled 2014-06-14 (×6): qty 2

## 2014-06-14 MED ORDER — SODIUM CHLORIDE 0.9 % IJ SOLN
3.0000 mL | Freq: Two times a day (BID) | INTRAMUSCULAR | Status: DC
  Administered 2014-06-14: 3 mL via INTRAVENOUS

## 2014-06-14 MED ORDER — SODIUM CHLORIDE 0.9 % IV SOLN: 250.0000 mL | INTRAVENOUS | Status: DC | PRN

## 2014-06-14 MED ORDER — COLCHICINE 0.6 MG PO TABS
0.6000 mg | ORAL_TABLET | Freq: Every day | ORAL | Status: DC
  Administered 2014-06-15 – 2014-06-18 (×3): 0.6 mg via ORAL
  Filled 2014-06-14 (×4): qty 1

## 2014-06-14 MED ORDER — GUAIFENESIN ER 600 MG PO TB12
600.0000 mg | ORAL_TABLET | Freq: Two times a day (BID) | ORAL | Status: DC
  Administered 2014-06-14 – 2014-06-18 (×8): 600 mg via ORAL
  Filled 2014-06-14 (×8): qty 1

## 2014-06-14 MED ORDER — CARVEDILOL 6.25 MG PO TABS
6.2500 mg | ORAL_TABLET | Freq: Two times a day (BID) | ORAL | Status: DC
  Administered 2014-06-15 (×2): 6.25 mg via ORAL
  Filled 2014-06-14 (×2): qty 1

## 2014-06-14 MED ORDER — ONDANSETRON HCL 4 MG PO TABS: 4.0000 mg | ORAL_TABLET | Freq: Four times a day (QID) | ORAL | Status: DC | PRN

## 2014-06-14 MED ORDER — LEVALBUTEROL HCL 0.63 MG/3ML IN NEBU: 0.6300 mg | INHALATION_SOLUTION | RESPIRATORY_TRACT | Status: DC | PRN

## 2014-06-14 MED ORDER — ASPIRIN 81 MG PO CHEW
81.0000 mg | CHEWABLE_TABLET | Freq: Every day | ORAL | Status: DC
  Administered 2014-06-14 – 2014-06-16 (×3): 81 mg via ORAL
  Filled 2014-06-14 (×3): qty 1

## 2014-06-14 MED ORDER — METHYLPREDNISOLONE SODIUM SUCC 125 MG IJ SOLR
125.0000 mg | Freq: Once | INTRAMUSCULAR | Status: AC
  Administered 2014-06-14: 125 mg via INTRAVENOUS
  Filled 2014-06-14: qty 2

## 2014-06-14 MED ORDER — IPRATROPIUM BROMIDE 0.02 % IN SOLN
0.5000 mg | Freq: Once | RESPIRATORY_TRACT | Status: AC
  Administered 2014-06-14: 0.5 mg via RESPIRATORY_TRACT
  Filled 2014-06-14: qty 2.5

## 2014-06-14 MED ORDER — ONDANSETRON HCL 4 MG/2ML IJ SOLN: 4.0000 mg | Freq: Four times a day (QID) | INTRAMUSCULAR | Status: DC | PRN

## 2014-06-14 MED ORDER — SODIUM CHLORIDE 0.9 % IJ SOLN: 3.0000 mL | INTRAMUSCULAR | Status: DC | PRN

## 2014-06-14 MED ORDER — ALBUTEROL (5 MG/ML) CONTINUOUS INHALATION SOLN
15.0000 mg/h | INHALATION_SOLUTION | Freq: Once | RESPIRATORY_TRACT | Status: AC
  Administered 2014-06-14: 15 mg/h via RESPIRATORY_TRACT
  Filled 2014-06-14: qty 20

## 2014-06-14 MED ORDER — IPRATROPIUM BROMIDE 0.02 % IN SOLN
0.5000 mg | RESPIRATORY_TRACT | Status: DC
  Administered 2014-06-14 – 2014-06-18 (×21): 0.5 mg via RESPIRATORY_TRACT
  Filled 2014-06-14 (×22): qty 2.5

## 2014-06-14 MED ORDER — ACETAMINOPHEN 325 MG PO TABS
650.0000 mg | ORAL_TABLET | Freq: Four times a day (QID) | ORAL | Status: DC | PRN
Start: 2014-06-14 — End: 2014-06-18

## 2014-06-14 MED ORDER — HEPARIN SODIUM (PORCINE) 5000 UNIT/ML IJ SOLN
5000.0000 [IU] | Freq: Three times a day (TID) | INTRAMUSCULAR | Status: DC
  Administered 2014-06-14 – 2014-06-18 (×12): 5000 [IU] via SUBCUTANEOUS
  Filled 2014-06-14 (×12): qty 1

## 2014-06-14 MED ORDER — ACETAMINOPHEN 650 MG RE SUPP
650.0000 mg | Freq: Four times a day (QID) | RECTAL | Status: DC | PRN
Start: 2014-06-14 — End: 2014-06-18

## 2014-06-14 MED ORDER — ATORVASTATIN CALCIUM 10 MG PO TABS
10.0000 mg | ORAL_TABLET | Freq: Every day | ORAL | Status: DC
  Administered 2014-06-15 – 2014-06-17 (×3): 10 mg via ORAL
  Filled 2014-06-14 (×3): qty 1

## 2014-06-14 MED ORDER — LEVALBUTEROL HCL 0.63 MG/3ML IN NEBU
0.6300 mg | INHALATION_SOLUTION | RESPIRATORY_TRACT | Status: DC
  Administered 2014-06-14 – 2014-06-18 (×21): 0.63 mg via RESPIRATORY_TRACT
  Filled 2014-06-14 (×22): qty 3

## 2014-06-14 NOTE — ED Provider Notes (Signed)
CSN: [id number]     Arrival date & time 06/14/14  1734 History   First MD Initiated Contact with Patient 06/14/14 1756     Chief Complaint  Patient presents with  . Shortness of Breath     (Consider location/radiation/quality/duration/timing/severity/associated sxs/prior Treatment) The history is provided by the patient.  Nathan [name] is a 74 y.o. male hx of CAD, HTN, COPD, here presenting with shortness of breath. Short of breath going on for several weeks. Progressively getting worse to the point that he gets short of breath when he talks. He came to the ER a week ago and received a course steroids. He then went to [place] and was given some amoxicillin. However today he got worsening shortness of breath. Denies any fevers and has some nonproductive cough. He is still smoking. As his nebs 4 times today with no relief. Given DuoNeb by EMS.   Past Medical History  Diagnosis Date  . Aorto-iliac disease     a. s/p bilat with stent 2007 - followed by [facility].  . CAD (coronary artery disease)     a. 10/2011 s/p CABG x4 (LIMA-LAD, SVG-Diag, SVG-OM1 and SVG-PDA)  b. 03/2012 NSTEMI s/p LHC SVG-RCA and LIMA-LAD but total occlusion of SVG-OM as well as SVG-diag. RX therapy recommended   . Hypertension   . ED (erectile dysfunction)   . COPD, severe   . Hyperlipidemia   . Stroke   . GERD (gastroesophageal reflux disease)   . Gout   . Paroxysmal atrial flutter     ablation  . Chronic combined systolic and diastolic CHF (congestive heart failure)     a. Last echo 04/2014: EF 40-45%.  Marland Kitchen PAF (paroxysmal atrial fibrillation)   . CKD (chronic kidney disease), stage III   . Tobacco abuse   . History of noncompliance with medical treatment   . Carotid artery disease     a. RCEA 2013 - followed by [facility].  . Anemia   . Lower GI bleed     a. LGIB 10/2012: Colonoscopy performed June 21 showed blood in the colon but no definite source identified, diverticular versus AVM suspected. Aspirin stopped.  . Mitral  regurgitation   . Hypertensive heart disease    Past Surgical History  Procedure Laterality Date  . Spinal fusion    . Hand surgery    . Neck fusion  1975  . Eye surgery    . Coronary artery bypass graft  10/31/2011    Procedure: CORONARY ARTERY BYPASS GRAFTING (CABG);  Surgeon: Delight Ovens, MD;  Location: [facility] OR;  Service: Open Heart Surgery;  Laterality: N/A;  times three using  Left Internal Mammary Artery and Right Greater Saphenous Vein Graft harvested endoscopically; TEE  . Endarterectomy  10/31/2011    Procedure: ENDARTERECTOMY CAROTID;  Surgeon: Nada Libman, MD;  Location: [facility] OR;  Service: Vascular;  Laterality: Right;  with patch angioplasty   . Carotid endarterectomy  10-31-2011    right  . Colonoscopy N/A 10/30/2012    Procedure: COLONOSCOPY;  Surgeon: Shirley Friar, MD;  Location: [facility] ENDOSCOPY;  Service: Endoscopy;  Laterality: N/A;  . Left heart catheterization with coronary angiogram N/A 09/04/2011    Procedure: LEFT HEART CATHETERIZATION WITH CORONARY ANGIOGRAM;  Surgeon: Lesleigh Noe, MD;  Location: [facility] CATH LAB;  Service: Cardiovascular;  Laterality: N/A;  . Left heart cath N/A 04/03/2012    Procedure: LEFT HEART CATH;  Surgeon: Lennette Bihari, MD;  Location: [facility] CATH LAB;  Service: Cardiovascular;  Laterality: N/A;   Family History  Problem Relation Age of Onset  . Cancer Mother   . Tuberculosis Mother   . Heart disease Father     He did NOT have it before age 54   History  Substance Use Topics  . Smoking status: Current Some Day Smoker -- 1.00 packs/day for 64 years    Types: Cigarettes  . Smokeless tobacco: Never Used  . 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   All other systems reviewed and are negative.     Allergies  Review of patient's allergies  indicates no known allergies.  Home Medications   Prior to Admission medications   Medication Sig Start Date End Date Taking? Authorizing Provider  albuterol (PROVENTIL HFA;VENTOLIN HFA) 108 (90 BASE) MCG/ACT inhaler Inhale 1-2 puffs into the lungs every 6 (six) hours as needed for wheezing or shortness of breath.   Yes Historical Provider, MD  albuterol (PROVENTIL) (2.5 MG/3ML) 0.083% nebulizer solution Take 3 mLs (2.5 mg total) by nebulization every 4 (four) hours as needed for wheezing or shortness of breath. 06/06/14  Yes Geoffery Lyons, MD  aspirin 81 MG chewable tablet Chew 1 tablet (81 mg total) by mouth daily. 01/05/14  Yes Jeralyn Bennett, MD  atorvastatin (LIPITOR) 10 MG tablet Take 1 tablet (10 mg total) by mouth daily at 6 PM. 01/05/14  Yes Jeralyn Bennett, MD  buPROPion (WELLBUTRIN XL) 300 MG 24 hr tablet Take 300 mg by mouth daily.   Yes Historical Provider, MD  carvedilol (COREG) 12.5 MG tablet Take 1 tablet (12.5 mg total) by mouth 2 (two) times daily with a meal. 04/24/14  Yes Jeralyn Bennett, MD  carvedilol (COREG) 6.25 MG tablet Take 6.25 mg by mouth 2 (two) times daily with a meal.   Yes Historical Provider, MD  colchicine 0.6 MG tablet Take 0.6 mg by mouth daily.   Yes Historical Provider, MD  eplerenone (INSPRA) 25 MG tablet Take 12.5 mg by mouth daily.   Yes Historical Provider, MD  furosemide (LASIX) 40 MG tablet Take 40 mg by mouth daily.   Yes Historical Provider, MD  metoprolol tartrate (LOPRESSOR) 25 MG tablet Take 25 mg by mouth 2 (two) times daily.   Yes Historical Provider, MD  nitroGLYCERIN (NITROSTAT) 0.4 MG SL tablet Place 0.4 mg under the tongue every 5 (five) minutes as needed for chest pain.   Yes Historical Provider, MD  tiotropium (SPIRIVA) 18 MCG inhalation capsule Place 18 mcg into inhaler and inhale daily.   Yes Historical Provider, MD  azithromycin (ZITHROMAX Z-PAK) 250 MG tablet 2 po day one, then 1 daily x 4 days Patient not taking: Reported on 06/14/2014  06/06/14   Geoffery Lyons, MD  losartan (COZAAR) 25 MG tablet Take 1 tablet (25 mg total) by mouth daily. Patient not taking: Reported on 06/14/2014 04/24/14   Jeralyn Bennett, MD  potassium chloride SA (K-DUR,KLOR-CON) 20 MEQ tablet Take 1 tablet (20 mEq total) by mouth daily. Patient not taking: Reported on 06/14/2014 01/06/14   Jeralyn Bennett, MD  predniSONE (DELTASONE) 10 MG tablet Take 2 tablets (20 mg total) by mouth 2 (two) times daily. Patient not taking: Reported on 06/14/2014 06/06/14   Geoffery Lyons, MD   BP 164/78 mmHg  Pulse 101  Temp(Src)  98.6 F (37 C) (Oral)  Resp 34  SpO2 91% Physical Exam  Constitutional: He is oriented to person, place, and time.  Tachypneic, chronically ill   HENT:  Head: Normocephalic.  Mouth/Throat: Oropharynx is clear and moist.  Eyes: Conjunctivae and EOM are normal. Pupils are equal, round, and reactive to light.  Neck: Normal range of motion. Neck supple.  Cardiovascular: Normal rate, regular rhythm and normal heart sounds.   Pulmonary/Chest:  Tachypneic, + diffuse wheezing, mild retractions. Talking in 2-3 word sentences   Abdominal: Soft. Bowel sounds are normal. He exhibits no distension. There is no tenderness. There is no rebound.  Musculoskeletal:  1+ edema (chronic)   Neurological: He is alert and oriented to person, place, and time. No cranial nerve deficit. Coordination normal.  Skin: Skin is warm and dry.  Psychiatric: He has a normal mood and affect. His behavior is normal. Judgment and thought content normal.  Nursing note and vitals reviewed.   ED Course  Procedures (including critical care time) Labs Review Labs Reviewed  CBC WITH DIFFERENTIAL/PLATELET - Abnormal; Notable for the following:    WBC 10.7 (*)    Hemoglobin 12.8 (*)    MCH 25.7 (*)    RDW 17.0 (*)    Neutrophils Relative % 87 (*)    Neutro Abs 9.3 (*)    Lymphocytes Relative 7 (*)    All other components within normal limits  COMPREHENSIVE METABOLIC PANEL -  Abnormal; Notable for the following:    BUN 26 (*)    Creatinine, Ser 1.41 (*)    Albumin 3.4 (*)    GFR calc non Af Amer 47 (*)    GFR calc Af Amer 55 (*)    All other components within normal limits  I-STAT TROPOININ, ED  I-STAT CG4 LACTIC ACID, ED    Imaging Review Dg Chest 2 View  06/14/2014   CLINICAL DATA:  Worsening shortness of breath  EXAM: CHEST  2 VIEW  COMPARISON:  06/06/2014  FINDINGS: Bilateral pleural effusions, moderate on the right and small on the left. The left effusion has slightly increased from comparison study, with loculation along the lateral and posterior chest wall. The underlying lung is obscured at the right base. There are pleural calcifications at the bilateral apex, but none definitely seen at the bases.  Stable heart size and aortic contours post CABG.  No pulmonary edema.  No pneumothorax.  IMPRESSION: Moderate right and small left pleural effusions. The partially loculated left pleural effusion has mildly increased since 06/06/2014.   Electronically Signed   By: Tiburcio Pea M.D.   On: 06/14/2014 18:29   Ct Chest Wo Contrast  06/14/2014   CLINICAL DATA:  74 year old male with progressive shortness of breath over the past month and bilateral pleural effusions  EXAM: CT CHEST WITHOUT CONTRAST  TECHNIQUE: Multidetector CT imaging of the chest was performed following the standard protocol without IV contrast.  COMPARISON:  Prior chest x-ray 06/14/2014  FINDINGS: Mediastinum: Unremarkable thyroid gland. Mediastinal lymph nodes remain within normal limits for size. High right paratracheal node measures 9 mm in short axis on image 17 of series 2. Unremarkable thoracic esophagus.  Heart/Vascular: Limited evaluation in the absence of intravenous contrast. Conventional 3 vessel arch anatomy. Scattered atherosclerotic calcifications including coronary artery disease. Evidence of prior multivessel CABG. The heart is enlarged. No pericardial effusion.  Lungs/Pleura: Focal  calcified pleural plaques in the posteromedial aspect of the left lower thorax and at the right lung apex. Small and partially  loculated left pleural effusion with pleural fluid extending through the fissure. Moderate right pleural effusion. Associated dependent atelectasis in the right greater than left lower lobes. No pulmonary edema, focal airspace consolidation or suspicious pulmonary nodule. Diffuse mild lower lobe bronchial wall thickening.  Bones/Soft Tissues: Chronic nonunion of the median sternotomy. The cerclage wires remain intact. No acute fracture or aggressive appearing lytic or blastic osseous lesion.  Upper Abdomen: Visualized upper abdominal organs are unremarkable.  IMPRESSION: 1. Moderate right and small low but loculated left pleural effusions. No definitive etiology for the pleural fluid is identified by CT imaging. Given the partial loculation on the left, chronic effusions perhaps related to prior episodes of congestive heart failure is favored. 2. Associated chronic right greater than left lower lobe atelectasis. 3. 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 This may predispose to chronic pleural effusion formation. 4. Atherosclerosis including coronary artery disease. 5. Chronic nonunion of the median sternotomy. Cerclage wires remain intact. 6. Diffuse mild lower lobe bronchial wall thickening.   Electronically Signed   By: Malachy MoanHeath  McCullough M.D.   On: 06/14/2014 19:34     EKG Interpretation   Date/Time:  Wednesday June 14 2014 17:51:30 EST Ventricular Rate:  104 PR Interval:  128 QRS Duration: 83 QT Interval:  332 QTC Calculation: 437 R Axis:   40 Text Interpretation:  Sinus tachycardia with irregular rate Nonspecific  repol abnormality, diffuse leads No significant change since last tracing  Confirmed by Findley Blankenbaker  MD, Wilder Kurowski ([id number]) on 06/14/2014 7:17:46 PM      MDM   Final diagnoses:  None   Nathan [name] is a  74 y.o. male here with SOB. Likely COPD exacerbation. Will get labs, CXR. He is tachypneic with retractions with O2 91-92%. Will give continuous nebs, IV steroids and likely admit.    8:06 PM cxr showed pleural effusions with loculations. CT showed no obvious pneumonia. Will admit to tele.     Richardean Canalavid H Timmie Dugue, MD 06/14/14 2009

## 2014-06-14 NOTE — H&P (Signed)
[facility] History and Physical  Nathan [name] MGN:[id number] DOB: 04-06-1941 DOA: 06/14/2014   PCP: Irven Shelling, MD  Specialists: Dr. Daneen Schick is his cardiologist. He also gets care at the [facility] in [place].  Chief Complaint: Shortness of breath for many weeks, worse over the last couple weeks  HPI: Nathan [name] is a 74 y.o. male with a past medical history of COPD who continues to smoke, biventricular congestive heart failure, coronary artery disease, paroxysmal atrial fibrillation who presented with the complaints of shortness of breath. He tells me that he has been short of breath for the past 2-3 months but worse in the last few weeks. He came in to the [facility] emergency department about 2 weeks ago. He was treated with nebulizer treatments and was discharged. He went to his [place] last week and was prescribed amoxicillin and steroids. He has improved some, but symptoms persist, so he decided to come back to the hospital. His oxygen saturations were 92% on 3 L by nasal cannula. He does not use oxygen at home. He complains of a cough with whitish expectoration. Has been wheezing. Denies any chest pain or fever. No nausea, vomiting. No dysuria. No no diarrhea. No dizziness. After receiving breathing treatments in the ED, he is feeling slightly better. Denies any worsening of his leg swelling.  Home Medications: Prior to Admission medications   Medication Sig Start Date End Date Taking? Authorizing Provider  albuterol (PROVENTIL HFA;VENTOLIN HFA) 108 (90 BASE) MCG/ACT inhaler Inhale 1-2 puffs into the lungs every 6 (six) hours as needed for wheezing or shortness of breath.   Yes Historical Provider, MD  albuterol (PROVENTIL) (2.5 MG/3ML) 0.083% nebulizer solution Take 3 mLs (2.5 mg total) by nebulization every 4 (four) hours as needed for wheezing or shortness of breath. 06/06/14  Yes Veryl Speak, MD  aspirin 81 MG chewable tablet Chew 1 tablet (81 mg total) by  mouth daily. 01/05/14  Yes Kelvin Cellar, MD  atorvastatin (LIPITOR) 10 MG tablet Take 1 tablet (10 mg total) by mouth daily at 6 PM. 01/05/14  Yes Kelvin Cellar, MD  buPROPion (WELLBUTRIN XL) 300 MG 24 hr tablet Take 300 mg by mouth daily.   Yes Historical Provider, MD  carvedilol (COREG) 12.5 MG tablet Take 1 tablet (12.5 mg total) by mouth 2 (two) times daily with a meal. 04/24/14  Yes Kelvin Cellar, MD  carvedilol (COREG) 6.25 MG tablet Take 6.25 mg by mouth 2 (two) times daily with a meal.   Yes Historical Provider, MD  colchicine 0.6 MG tablet Take 0.6 mg by mouth daily.   Yes Historical Provider, MD  eplerenone (INSPRA) 25 MG tablet Take 12.5 mg by mouth daily.   Yes Historical Provider, MD  furosemide (LASIX) 40 MG tablet Take 40 mg by mouth daily.   Yes Historical Provider, MD  metoprolol tartrate (LOPRESSOR) 25 MG tablet Take 25 mg by mouth 2 (two) times daily.   Yes Historical Provider, MD  nitroGLYCERIN (NITROSTAT) 0.4 MG SL tablet Place 0.4 mg under the tongue every 5 (five) minutes as needed for chest pain.   Yes Historical Provider, MD  tiotropium (SPIRIVA) 18 MCG inhalation capsule Place 18 mcg into inhaler and inhale daily.   Yes Historical Provider, MD  azithromycin (ZITHROMAX Z-PAK) 250 MG tablet 2 po day one, then 1 daily x 4 days Patient not taking: Reported on 06/14/2014 06/06/14   Veryl Speak, MD  losartan (COZAAR) 25 MG tablet Take 1 tablet (25 mg total) by  mouth daily. Patient not taking: Reported on 06/14/2014 04/24/14   Kelvin Cellar, MD  potassium chloride SA (K-DUR,KLOR-CON) 20 MEQ tablet Take 1 tablet (20 mEq total) by mouth daily. Patient not taking: Reported on 06/14/2014 01/06/14   Kelvin Cellar, MD  predniSONE (DELTASONE) 10 MG tablet Take 2 tablets (20 mg total) by mouth 2 (two) times daily. Patient not taking: Reported on 06/14/2014 06/06/14   Veryl Speak, MD    Allergies: No Known Allergies  Past Medical History: Past Medical History  Diagnosis Date  .  Aorto-iliac disease     a. s/p bilat with stent 2007 - followed by [facility].  . CAD (coronary artery disease)     a. 10/2011 s/p CABG x4 (LIMA-LAD, SVG-Diag, SVG-OM1 and SVG-PDA)  b. 03/2012 NSTEMI s/p LHC SVG-RCA and LIMA-LAD but total occlusion of SVG-OM as well as SVG-diag. RX therapy recommended   . Hypertension   . ED (erectile dysfunction)   . COPD, severe   . Hyperlipidemia   . Stroke   . GERD (gastroesophageal reflux disease)   . Gout   . Paroxysmal atrial flutter     ablation  . Chronic combined systolic and diastolic CHF (congestive heart failure)     a. Last echo 04/2014: EF 40-45%.  Marland Kitchen PAF (paroxysmal atrial fibrillation)   . CKD (chronic kidney disease), stage III   . Tobacco abuse   . History of noncompliance with medical treatment   . Carotid artery disease     a. RCEA 2013 - followed by [facility].  . Anemia   . Lower GI bleed     a. LGIB 10/2012: Colonoscopy performed June 21 showed blood in the colon but no definite source identified, diverticular versus AVM suspected. Aspirin stopped.  . Mitral regurgitation   . Hypertensive heart disease     Past Surgical History  Procedure Laterality Date  . Spinal fusion    . Hand surgery    . Neck fusion  1975  . Eye surgery    . Coronary artery bypass graft  10/31/2011    Procedure: CORONARY ARTERY BYPASS GRAFTING (CABG);  Surgeon: Grace Isaac, MD;  Location: [place];  Service: Open Heart Surgery;  Laterality: N/A;  times three using  Left Internal Mammary Artery and Right Greater Saphenous Vein Graft harvested endoscopically; TEE  . Endarterectomy  10/31/2011    Procedure: ENDARTERECTOMY CAROTID;  Surgeon: Serafina Mitchell, MD;  [facility];  Service: Vascular;  Laterality: Right;  with patch angioplasty   . Carotid endarterectomy  10-31-2011    right  . Colonoscopy N/A 10/30/2012    Procedure: COLONOSCOPY;  Surgeon: Lear Ng, MD;  Location: [facility] ENDOSCOPY;  Service: Endoscopy;  Laterality: N/A;  . Left heart  catheterization with coronary angiogram N/A 09/04/2011    Procedure: LEFT HEART CATHETERIZATION WITH CORONARY ANGIOGRAM;  Surgeon: Sinclair Grooms, MD;  Location: [facility] CATH LAB;  Service: Cardiovascular;  Laterality: N/A;  . Left heart cath N/A 04/03/2012    Procedure: LEFT HEART CATH;  Surgeon: Troy Sine, MD;  Location: [facility] CATH LAB;  Service: Cardiovascular;  Laterality: N/A;    Social History: He lives in [place] with his wife. He has a 65-pack-year history of smoking. No alcohol use. No illicit drug use. Continues to smoke even now. Independent with daily activities. Usually.  Family History:  Family History  Problem Relation Age of Onset  . Cancer Mother   . Tuberculosis Mother   . Heart disease Father  He did NOT have it before age 37     Review of Systems - History obtained from the patient General ROS: positive for  - fatigue Psychological ROS: negative Ophthalmic ROS: negative ENT ROS: negative Allergy and Immunology ROS: negative Hematological and Lymphatic ROS: negative Endocrine ROS: negative Respiratory ROS: as in hpi Cardiovascular ROS: as in hpi Gastrointestinal ROS: no abdominal pain, change in bowel habits, or black or bloody stools Genito-Urinary ROS: no dysuria, trouble voiding, or hematuria Musculoskeletal ROS: negative Neurological ROS: no TIA or stroke symptoms Dermatological ROS: negative  Physical Examination  Filed Vitals:   06/14/14 1746 06/14/14 1819 06/14/14 1900 06/14/14 1925  BP:  164/78 160/78   Pulse:  101 101   Temp:      TempSrc:      Resp:  34    SpO2: 97% 98% 90% 91%    BP 160/78 mmHg  Pulse 101  Temp(Src) 98.6 F (37 C) (Oral)  Resp 34  SpO2 91%  General appearance: alert, cooperative, appears stated age and no distress Head: Normocephalic, without obvious abnormality, atraumatic Eyes: conjunctivae/corneas clear. PERRL, EOM's intact. Throat: lips, mucosa, and tongue normal; teeth and gums normal Neck: no adenopathy,  no carotid bruit, no JVD, supple, symmetrical, trachea midline and thyroid not enlarged, symmetric, no tenderness/mass/nodules Resp: Coarse breath sounds bilaterally. Wheezing is present bilaterally. No rhonchi. Diminished air entry at the bases. Dullness to percussion at the bases. Few crackles. Cardio: S1, S2 is tachycardic. Regular. No S3, S4. No rubs, or bruit. He has a systolic murmur at the apex. GI: soft, non-tender; bowel sounds normal; no masses,  no organomegaly Extremities: Edema is noted in bilateral lower extremities. Not much worse according to the patient. Pulses: 2+ and symmetric Skin: Skin color, texture, turgor normal. No rashes or lesions Neurologic: Alert and oriented 3. No focal neurological deficits are present.  Laboratory Data: Results for orders placed or performed during the hospital encounter of 06/14/14 (from the past 48 hour(s))  CBC with Differential     Status: Abnormal   Collection Time: 06/14/14  5:58 PM  Result Value Ref Range   WBC 10.7 (H) 4.0 - 10.5 K/uL   RBC 4.99 4.22 - 5.81 MIL/uL   Hemoglobin 12.8 (L) 13.0 - 17.0 g/dL   HCT 42.4 39.0 - 52.0 %   MCV 85.0 78.0 - 100.0 fL   MCH 25.7 (L) 26.0 - 34.0 pg   MCHC 30.2 30.0 - 36.0 g/dL   RDW 17.0 (H) 11.5 - 15.5 %   Platelets 171 150 - 400 K/uL   Neutrophils Relative % 87 (H) 43 - 77 %   Neutro Abs 9.3 (H) 1.7 - 7.7 K/uL   Lymphocytes Relative 7 (L) 12 - 46 %   Lymphs Abs 0.8 0.7 - 4.0 K/uL   Monocytes Relative 5 3 - 12 %   Monocytes Absolute 0.5 0.1 - 1.0 K/uL   Eosinophils Relative 1 0 - 5 %   Eosinophils Absolute 0.1 0.0 - 0.7 K/uL   Basophils Relative 0 0 - 1 %   Basophils Absolute 0.0 0.0 - 0.1 K/uL  Comprehensive metabolic panel     Status: Abnormal   Collection Time: 06/14/14  5:58 PM  Result Value Ref Range   Sodium 140 135 - 145 mmol/L   Potassium 4.3 3.5 - 5.1 mmol/L   Chloride 104 96 - 112 mmol/L   CO2 28 19 - 32 mmol/L   Glucose, Bld 90 70 - 99 mg/dL   BUN 26 (  H) 6 - 23 mg/dL    Creatinine, Ser 1.41 (H) 0.50 - 1.35 mg/dL   Calcium 8.6 8.4 - 10.5 mg/dL   Total Protein 6.2 6.0 - 8.3 g/dL   Albumin 3.4 (L) 3.5 - 5.2 g/dL   AST 22 0 - 37 U/L   ALT 38 0 - 53 U/L   Alkaline Phosphatase 64 39 - 117 U/L   Total Bilirubin 0.9 0.3 - 1.2 mg/dL   GFR calc non Af Amer 47 (L) >90 mL/min   GFR calc Af Amer 55 (L) >90 mL/min    Comment: (NOTE) The eGFR has been calculated using the CKD EPI equation. This calculation has not been validated in all clinical situations. eGFR's persistently <90 mL/min signify possible Chronic Kidney Disease.    Anion gap 8 5 - 15  I-stat troponin, ED     Status: None   Collection Time: 06/14/14  6:10 PM  Result Value Ref Range   Troponin i, poc 0.02 0.00 - 0.08 ng/mL   Comment 3            Comment: Due to the release kinetics of cTnI, a negative result within the first hours of the onset of symptoms does not rule out myocardial infarction with certainty. If myocardial infarction is still suspected, repeat the test at appropriate intervals.   I-Stat CG4 Lactic Acid, ED     Status: None   Collection Time: 06/14/14  6:43 PM  Result Value Ref Range   Lactic Acid, Venous 1.58 0.5 - 2.0 mmol/L    Radiology Reports: Dg Chest 2 View  06/14/2014   CLINICAL DATA:  Worsening shortness of breath  EXAM: CHEST  2 VIEW  COMPARISON:  06/06/2014  FINDINGS: Bilateral pleural effusions, moderate on the right and small on the left. The left effusion has slightly increased from comparison study, with loculation along the lateral and posterior chest wall. The underlying lung is obscured at the right base. There are pleural calcifications at the bilateral apex, but none definitely seen at the bases.  Stable heart size and aortic contours post CABG.  No pulmonary edema.  No pneumothorax.  IMPRESSION: Moderate right and small left pleural effusions. The partially loculated left pleural effusion has mildly increased since 06/06/2014.   Electronically Signed   By:  Jorje Guild M.D.   On: 06/14/2014 18:29   Ct Chest Wo Contrast  06/14/2014   CLINICAL DATA:  74 year old male with progressive shortness of breath over the past month and bilateral pleural effusions  EXAM: CT CHEST WITHOUT CONTRAST  TECHNIQUE: Multidetector CT imaging of the chest was performed following the standard protocol without IV contrast.  COMPARISON:  Prior chest x-ray 06/14/2014  FINDINGS: Mediastinum: Unremarkable thyroid gland. Mediastinal lymph nodes remain within normal limits for size. High right paratracheal node measures 9 mm in short axis on image 17 of series 2. Unremarkable thoracic esophagus.  Heart/Vascular: Limited evaluation in the absence of intravenous contrast. Conventional 3 vessel arch anatomy. Scattered atherosclerotic calcifications including coronary artery disease. Evidence of prior multivessel CABG. The heart is enlarged. No pericardial effusion.  Lungs/Pleura: Focal calcified pleural plaques in the posteromedial aspect of the left lower thorax and at the right lung apex. Small and partially loculated left pleural effusion with pleural fluid extending through the fissure. Moderate right pleural effusion. Associated dependent atelectasis in the right greater than left lower lobes. No pulmonary edema, focal airspace consolidation or suspicious pulmonary nodule. Diffuse mild lower lobe bronchial wall thickening.  Bones/Soft Tissues: Chronic  nonunion of the median sternotomy. The cerclage wires remain intact. No acute fracture or aggressive appearing lytic or blastic osseous lesion.  Upper Abdomen: Visualized upper abdominal organs are unremarkable.  IMPRESSION: 1. Moderate right and small low but loculated left pleural effusions. No definitive etiology for the pleural fluid is identified by CT imaging. Given the partial loculation on the left, chronic effusions perhaps related to prior episodes of congestive heart failure is favored. 2. Associated chronic right greater than left  lower lobe atelectasis. 3. 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 This may predispose to chronic pleural effusion formation. 4. Atherosclerosis including coronary artery disease. 5. Chronic nonunion of the median sternotomy. Cerclage wires remain intact. 6. Diffuse mild lower lobe bronchial wall thickening.   Electronically Signed   By: Jacqulynn Cadet M.D.   On: 06/14/2014 19:34    Electrocardiogram: Sinus tachycardia at 104 bpm. Normal axis. Normal intervals. No concerning ST or T-wave changes noted.  Problem List  Principal Problem:   Acute exacerbation of chronic obstructive pulmonary disease (COPD) Active Problems:   COPD (chronic obstructive pulmonary disease), gold stage C.   Chronic kidney disease (CKD), stage III (moderate)   PAF (paroxysmal atrial fibrillation)   CAD (coronary artery disease)   Chronic combined systolic and diastolic CHF (congestive heart failure)   Assessment: This is a 74 year old Caucasian male who comes with progressive shortness of breath. This appears to be acute COPD exacerbation. There was no evidence for pulmonary edema on chest x-ray or CT. He does have evidence for pleural effusions, but these appear to be chronic.  Plan: #1 acute COPD exacerbation: He be admitted. He'll be treated with steroids, antibiotics and nebulizer treatments around-the-clock. Mucinex will be given. Smoking cessation counseling was provided.  #2 Bilateral pleural effusions: Appear to be chronic based on the CT scan report. Pleural effusions have been present previously as well. No need for acute intervention at this time. May benefit from pulmonology input at some point.  #3 history of coronary artery disease status post CABG: Appears to be stable. Troponin is normal. Continue with aspirin. Continue with his beta blocker for now.  #4 history of combined systolic and diastolic congestive heart failure: He had an  echocardiogram in December which showed EF of 40-45%. He is on Lasix at home. We'll check his weight. We will give her one dose of intravenous Lasix tonight. Resume his usual oral dose from tomorrow. It's unclear if he is on an ACE inhibitor or ARB. Pharmacy to pursue this further.  #5 chronic kidney disease stage III: Creatinine appears to be close to baseline. Continue to monitor.  #6 history of paroxysmal atrial fibrillation: Currently in sinus rhythm. He has had GI bleed in the past (he had a diverticular bleed in June 2014) and has been noted to be noncompliant and hence he is not on anticoagulation. He'll be monitored on telemetry.   DVT Prophylaxis: Heparin Code Status: Full code Family Communication: Discussed with the patient  Disposition Plan: Admit to telemetry   Further management decisions will depend on results of further testing and patient's response to treatment.   [facility]  [facility] Pager [phone]  If 7PM-7AM, please contact night-coverage [email or web address] Password [facility]  06/14/2014, 8:28 PM

## 2014-06-14 NOTE — Progress Notes (Signed)
ANTIBIOTIC CONSULT NOTE - INITIAL  Pharmacy Consult for Levaquin Indication: COPD exacerbation  No Known Allergies  Patient Measurements:    Vital Signs: Temp: 98.6 F (37 C) (02/03 1737) Temp Source: Oral (02/03 1737) BP: 159/81 mmHg (02/03 2045) Pulse Rate: 107 (02/03 2045) Intake/Output from previous day:   Intake/Output from this shift:    Labs:  Recent Labs  06/14/14 1758  WBC 10.7*  HGB 12.8*  PLT 171  CREATININE 1.41*   Estimated Creatinine Clearance: 49.7 mL/min (by C-G formula based on Cr of 1.41). No results for input(s): VANCOTROUGH, VANCOPEAK, VANCORANDOM, GENTTROUGH, GENTPEAK, GENTRANDOM, TOBRATROUGH, TOBRAPEAK, TOBRARND, AMIKACINPEAK, AMIKACINTROU, AMIKACIN in the last 72 hours.   Microbiology: No results found for this or any previous visit (from the past 720 hour(s)).  Medical History: Past Medical History  Diagnosis Date  . Aorto-iliac disease     a. s/p bilat with stent 2007 - followed by [facility].  . CAD (coronary artery disease)     a. 10/2011 s/p CABG x4 (LIMA-LAD, SVG-Diag, SVG-OM1 and SVG-PDA)  b. 03/2012 NSTEMI s/p LHC SVG-RCA and LIMA-LAD but total occlusion of SVG-OM as well as SVG-diag. RX therapy recommended   . Hypertension   . ED (erectile dysfunction)   . COPD, severe   . Hyperlipidemia   . Stroke   . GERD (gastroesophageal reflux disease)   . Gout   . Paroxysmal atrial flutter     ablation  . Chronic combined systolic and diastolic CHF (congestive heart failure)     a. Last echo 04/2014: EF 40-45%.  Marland Kitchen. PAF (paroxysmal atrial fibrillation)   . CKD (chronic kidney disease), stage III   . Tobacco abuse   . History of noncompliance with medical treatment   . Carotid artery disease     a. RCEA 2013 - followed by [facility].  . Anemia   . Lower GI bleed     a. LGIB 10/2012: Colonoscopy performed June 21 showed blood in the colon but no definite source identified, diverticular versus AVM suspected. Aspirin stopped.  . Mitral regurgitation   .  Hypertensive heart disease     Medications:  Scheduled:  . aspirin  81 mg Oral Daily  . [START ON 06/15/2014] atorvastatin  10 mg Oral q1800  . [START ON 06/15/2014] carvedilol  6.25 mg Oral BID WC  . [START ON 06/15/2014] colchicine  0.6 mg Oral Daily  . [START ON 06/15/2014] furosemide  40 mg Oral Daily  . guaiFENesin  600 mg Oral BID  . heparin  5,000 Units Subcutaneous 3 times per day  . ipratropium  0.5 mg Nebulization Q4H  . levalbuterol  0.63 mg Nebulization Q4H  . methylPREDNISolone (SOLU-MEDROL) injection  60 mg Intravenous Q6H  . sodium chloride  3 mL Intravenous Q12H  . sodium chloride  3 mL Intravenous Q12H   Infusions:  . sodium chloride    . levofloxacin (LEVAQUIN) IV     PRN: sodium chloride, acetaminophen **OR** acetaminophen, levalbuterol, ondansetron **OR** ondansetron (ZOFRAN) IV, sodium chloride  Assessment: 74 yo male with COPD who continues to smoke who presents with shorness of breath. Pharmacy is consulted to dose levaquin for COPD exacerbation.  2/3 >> Levaquin >>  Tmax: Afebrile WBC: 10.7k Renal: SCr 1.41, CrCl ~50 ml/min  No cultures ordered  Goal of Therapy:  Eradication of infection Dose per indication, renal function  Plan:   Levaquin 500mg  IV q24h Follow up renal function & cultures, clinical course  Loralee PacasErin Cleburne Savini, PharmD, BCPS Pager: [phone] 06/14/2014,9:04 PM

## 2014-06-14 NOTE — ED Notes (Signed)
Bed: WA15 Expected date:  Expected time:  Means of arrival:  Comments: EMS  

## 2014-06-14 NOTE — ED Notes (Signed)
Patient transported to X-ray 

## 2014-06-14 NOTE — Progress Notes (Signed)
CSW met with patient at bedside. Wife and neighbor were present. Patient confirmed that he presents to the ED tonight due to SOB. Per note, SOB has been going on for several weeks. Patient informed CSW that he lives at home in [place] with his wife.  Patient states that he is able to complete his ADL's independently. Patient informed CSW that he does have home health care. According to patient, a caregiver comes to his home every Friday.  Wife/Cathy Buckner [phone]  Willette Brace [phone] ED CSW 06/14/2014 9:23 PM

## 2014-06-14 NOTE — ED Notes (Signed)
Per EMS- Patient has a history of COPD, smoking x 65 years. Patient reports increased SOB for the last 3 days. Patient has inspiratory and expiratory wheezing. Patient was given an Albuterol 5 mg neb and then another neb Albuterol 5 mg and Atrovent 0.5 mg prior to arrival to the ED.

## 2014-06-15 DIAGNOSIS — J441 Chronic obstructive pulmonary disease with (acute) exacerbation: Principal | ICD-10-CM

## 2014-06-15 LAB — CBC
HCT: 42.1 % (ref 39.0–52.0)
HEMOGLOBIN: 13.1 g/dL (ref 13.0–17.0)
MCH: 25.9 pg — ABNORMAL LOW (ref 26.0–34.0)
MCHC: 31.1 g/dL (ref 30.0–36.0)
MCV: 83.2 fL (ref 78.0–100.0)
Platelets: 182 10*3/uL (ref 150–400)
RBC: 5.06 MIL/uL (ref 4.22–5.81)
RDW: 16.7 % — ABNORMAL HIGH (ref 11.5–15.5)
WBC: 8 10*3/uL (ref 4.0–10.5)

## 2014-06-15 LAB — BASIC METABOLIC PANEL
Anion gap: 9 (ref 5–15)
BUN: 29 mg/dL — ABNORMAL HIGH (ref 6–23)
CALCIUM: 8.6 mg/dL (ref 8.4–10.5)
CO2: 26 mmol/L (ref 19–32)
CREATININE: 1.43 mg/dL — AB (ref 0.50–1.35)
Chloride: 103 mmol/L (ref 96–112)
GFR calc Af Amer: 54 mL/min — ABNORMAL LOW (ref >90)
GFR calc non Af Amer: 46 mL/min — ABNORMAL LOW (ref >90)
Glucose, Bld: 132 mg/dL — ABNORMAL HIGH (ref 70–99)
Potassium: 4.3 mmol/L (ref 3.5–5.1)
Sodium: 138 mmol/L (ref 135–145)

## 2014-06-15 MED ORDER — LEVOFLOXACIN 500 MG PO TABS
500.0000 mg | ORAL_TABLET | Freq: Every day | ORAL | Status: DC
  Administered 2014-06-15: 500 mg via ORAL
  Filled 2014-06-15: qty 1

## 2014-06-15 MED ORDER — MOMETASONE FURO-FORMOTEROL FUM 100-5 MCG/ACT IN AERO
2.0000 | INHALATION_SPRAY | Freq: Two times a day (BID) | RESPIRATORY_TRACT | Status: DC
  Administered 2014-06-16 – 2014-06-18 (×5): 2 via RESPIRATORY_TRACT
  Filled 2014-06-15: qty 8.8

## 2014-06-15 MED ORDER — PREDNISONE 20 MG PO TABS
60.0000 mg | ORAL_TABLET | Freq: Every day | ORAL | Status: DC
  Administered 2014-06-16 – 2014-06-18 (×3): 60 mg via ORAL
  Filled 2014-06-15 (×3): qty 3

## 2014-06-15 MED ORDER — ZOLPIDEM TARTRATE 5 MG PO TABS
5.0000 mg | ORAL_TABLET | Freq: Once | ORAL | Status: AC
  Administered 2014-06-15: 5 mg via ORAL
  Filled 2014-06-15: qty 1

## 2014-06-15 NOTE — Progress Notes (Signed)
Nathan [name] WGN:[id number] DOB: 11/30/1940 DOA: 06/14/2014 PCP: Lillia MountainGRIFFIN,JOHN JOSEPH, MD  Brief narrative: 74 y/o ? 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 Not on AC, bimodal HF, GOLD COPD stg 'C', LGIB 2/2 to AVM's 10/2012, PAD s/p LLE stent 2005/R CEA 2013, continued [facility] with AECOPD +/- AECHF  Past medical history-As per Problem list Chart reviewed as below-   Consultants:  none  Procedures:  none  Antibiotics:  none   Subjective  Doing fair Feels improved about 50% from when he first came in but states that he started having lower extremity swelling ~2 days ago. Feels that he is gotten better by breathing treatments in addition to Lasix    Objective    Interim History:   Telemetry: Sinus rhythm    Objective: Filed Vitals:   06/15/14 0446 06/15/14 0531 06/15/14 0758 06/15/14 1406  BP:  164/67  132/61  Pulse:  102  84  Temp:  98.3 F (36.8 C)  98 F (36.7 C)  TempSrc:  Oral  Oral  Resp:  24  22  Height:      Weight: 84.414 kg (186 lb 1.6 oz)     SpO2:  98% 98% 98%    Intake/Output Summary (Last 24 hours) at 06/15/14 1533 Last data filed at 06/15/14 1406  Gross per 24 hour  Intake    240 ml  Output   2425 ml  Net  -2185 ml    Exam:  General: EOMI NCAT  Cardiovascular: S1-S2 no murmur rub or gallop  Respiratory: Clinically clear  Abdomen: Soft nontender nondistended no rebound  Skinno lower extremity edema  Neurogrossly intact   Data Reviewed: Basic Metabolic Panel:  Recent Labs Lab 06/14/14 1758 06/15/14 0440  NA 140 138  K 4.3 4.3  CL 104 103  CO2 28 26  GLUCOSE 90 132*  BUN 26* 29*  CREATININE 1.41* 1.43*  CALCIUM 8.6 8.6   Liver Function Tests:  Recent Labs Lab 06/14/14 1758  AST 22  ALT 38  ALKPHOS 64  BILITOT 0.9  PROT 6.2  ALBUMIN 3.4*   No results for input(s): LIPASE, AMYLASE in the last 168 hours. No results for input(s): AMMONIA in  the last 168 hours. CBC:  Recent Labs Lab 06/14/14 1758 06/15/14 0440  WBC 10.7* 8.0  NEUTROABS 9.3*  --   HGB 12.8* 13.1  HCT 42.4 42.1  MCV 85.0 83.2  PLT 171 182   Cardiac Enzymes: No results for input(s): CKTOTAL, CKMB, CKMBINDEX, TROPONINI in the last 168 hours. BNP: Invalid input(s): POCBNP CBG: No results for input(s): GLUCAP in the last 168 hours.  No results found for this or any previous visit (from the past 240 hour(s)).   Studies:              All Imaging reviewed and is as per above notation   Scheduled Meds: . aspirin  81 mg Oral Daily  . atorvastatin  10 mg Oral q1800  . carvedilol  6.25 mg Oral BID WC  . colchicine  0.6 mg Oral Daily  . furosemide  40 mg Oral Daily  . guaiFENesin  600 mg Oral BID  . heparin  5,000 Units Subcutaneous 3 times per day  . ipratropium  0.5 mg Nebulization Q4H  . levalbuterol  0.63 mg Nebulization Q4H  . levofloxacin (LEVAQUIN) IV  500 mg Intravenous Q24H  . methylPREDNISolone (SOLU-MEDROL) injection  60  mg Intravenous Q6H  . sodium chloride  3 mL Intravenous Q12H  . sodium chloride  3 mL Intravenous Q12H   Continuous Infusions:    Assessment/Plan: 1. AECOPD-transitioned from IV Solu-Medrol in a.m. to prednisone 60 mg daily. Continue ipratropium 0.5 every 4, Xopenex U4 scheduled and guaifenesin 600 twice a day. In addition we will add long-acting steroid mometasone as well. I will change his IV Levaquin to by mouth Levaquin 06/15/14 2. .Acute decompensation of bimodal Heart failure, echocardiogram 04/22/14 = EF 40-45% with inferior lateral akinesis, LVH noted, aortic valve sclerosis and mild mitral regurg -continue regular diuretic Lasix 40 mg by mouth daily. He was given a dose of IV Lasix 60 mg in the emergency room. Continue Coreg 6.25 bID with meals. His weight on last admission was 174 pounds he currently is at 186--fluid restrict 1500 cc, strict I/O, daily weights and try to avoid Foley 3. Medical noncompliance There is a  lot of uncertainty as to what medications he takes--I truly appreciate PharmD tenacity in trying to find out if he is taking his meds are not-thank you.  He will definitely need a home health nurse to go out and help him organize his medications.  He has not seen the advanced heart failure clinic in almost a year and this may be time for him to revisit them? 4. Paroxysmal atrial fibrillation, Chad2=6-not on anticoagulation.  Outpatient follow-up with regular cardiologist  5.  lower GI bleed 10/2012-no anticoagulation 6. PAD status post left lower extremity stent 2005, R CEA 2013-interval screening as an outpatient-made benefit from being on Pletal/aspirin higher dose? He truly does need to quit smoking and it is unlikely here for well 7. Gout-continue colchicine 0.6 daily 8. Hyperlipidemia-continue atorvastatin 10 daily  Code Status: Full I called patient's son on his cell phone who lives in [place] but did not get through to him. I will attempt to call again tomorrow Inpatient    Pleas Koch, MD  [facility] Pager [phone] 06/15/2014, 3:33 PM    LOS: 1 day

## 2014-06-15 NOTE — Evaluation (Signed)
Occupational Therapy Evaluation and Discharge Summary Patient Details Name: Nathan KellMaurice W Hesse MRN: [id number] DOB: 02/17/1941 Today's Date: 06/15/2014    History of Present Illness Pt is 74 yo male admitted with SOB for past month and COPD exacerbation.  Pt with chronic B pleural effusions.     Clinical Impression   Pt admitted with the above diagnosis and overall is doing well with adls.  Pt becomes SOB with activity but with rest breaks is independent with basic adls.  With 2L O2, pt O2 sats were 99%.  Pts O2 removed and pt walked in hallway. Pt became SOB.  O2 after 10 minutes of activity/walking/SOB was 95%.  Nursing notified of SOB and was told that O2 sats remained above 90%.  O2 replaced. Pt educated re: energy conservation and the need to quit smoking. Pt understands and is enrolled in smoking cessation class in March.  No further acute OT needs for this pt.      Follow Up Recommendations  No OT follow up    Equipment Recommendations  None recommended by OT    Recommendations for Other Services       Precautions / Restrictions Restrictions Weight Bearing Restrictions: No      Mobility Bed Mobility Overal bed mobility: Independent             General bed mobility comments: no assist needed.  Transfers Overall transfer level: Independent Equipment used:  (pt pushed IV pole in hallway)             General transfer comment: Pt was safe and independent with transfers.    Balance Overall balance assessment: No apparent balance deficits (not formally assessed)                                          ADL Overall ADL's : At baseline                                       General ADL Comments: Pt states he is at baseline for adls. Pt can complete all basic adls.  Pt becomes SOB when up and walking or standing to do adls but does not need assistance.      Vision                     Perception     Praxis       Pertinent Vitals/Pain Pain Assessment: No/denies pain     Hand Dominance Right   Extremity/Trunk Assessment Upper Extremity Assessment Upper Extremity Assessment: Overall WFL for tasks assessed   Lower Extremity Assessment Lower Extremity Assessment: Defer to PT evaluation   Cervical / Trunk Assessment Cervical / Trunk Assessment: Normal   Communication Communication Communication: No difficulties   Cognition Arousal/Alertness: Awake/alert Behavior During Therapy: WFL for tasks assessed/performed Overall Cognitive Status: Within Functional Limits for tasks assessed                     General Comments       Exercises       Shoulder Instructions      Home Living Family/patient expects to be discharged to:: Private residence Living Arrangements: Spouse/significant other Available Help at Discharge: Family;Available PRN/intermittently Type of Home: Mobile home Home Access: Stairs to enter Entrance Stairs-Number of Steps:  4   Home Layout: One level     Bathroom Shower/Tub: Walk-in shower;Door   [employer] Toilet: Standard Bathroom Accessibility: Yes   Home Equipment: None          Prior Functioning/Environment Level of Independence: Independent        Comments: Pt has been I but SOB. Pt has had RN coming in for past month but does not need anyone to assist with adls.    OT Diagnosis:     OT Problem List:     OT Treatment/Interventions:      OT Goals(Current goals can be found in the care plan section) Acute Rehab OT Goals Patient Stated Goal: to get out of here today. OT Goal Formulation: All assessment and education complete, DC therapy  OT Frequency:     Barriers to D/C:            Co-evaluation              End of Session Equipment Utilized During Treatment: Oxygen Nurse Communication: Mobility status  Activity Tolerance: Patient limited by fatigue Patient left: in bed;with call bell/phone within reach   Time:  1214-1244 OT Time Calculation (min): 30 min Charges:  OT General Charges $OT Visit: 1 Procedure OT Evaluation $Initial OT Evaluation Tier I: 1 Procedure OT Treatments $Self Care/Home Management : 8-22 mins G-Codes:    Hope Budds 22-Jun-2014, 12:53 PM  [phone]

## 2014-06-15 NOTE — Evaluation (Signed)
Physical Therapy Evaluation-1x Patient Details Name: Nathan [name] MRN: [id number] DOB: 06/04/1940 Today's Date: 06/15/2014   History of Present Illness  Pt is 74 yo male admitted with SOB for past month and COPD exacerbation.  Pt with chronic B pleural effusions.    Clinical Impression  On eval, pt was Mod Ind with mobility-able to ambulate ~500+' while pushing IV pole. Dyspnea 2-3/4. O2 sats ranged from 85-90% on RA during ambulation. VCs for pursed lip breathing and for pt to take rest breaks as needed. Replaced [place] O2 on pt at end of session. Do not feel pt has any acute or follow up PT needs at this time. Recommend daily ambulation around unit with family or nursing supervision. 1x eval. Will sign off.     Follow Up Recommendations No PT follow up    Equipment Recommendations  None recommended by PT    Recommendations for Other Services       Precautions / Restrictions Precautions Precautions: None Precaution Comments: monitor sats Restrictions Weight Bearing Restrictions: No      Mobility  Bed Mobility Overal bed mobility: Independent             General bed mobility comments: no assist needed.  Transfers Overall transfer level: Independent Equipment used:  (pt pushed IV pole in hallway)             General transfer comment: Pt was safe and independent with transfers.  Ambulation/Gait Ambulation/Gait assistance: Modified independent (Device/Increase time) Ambulation Distance (Feet): 500 Feet Assistive device:  (pt pushed IV pole) Gait Pattern/deviations: WFL(Within Functional Limits)     General Gait Details: 3 brief standing breaks needed for rest and O2 sat recovery. Sats ranged from 85-90% on RA during ambulation. Dyspnea 2-3/4. VCs pursed lip breathing and for pt to take breaks as needed.   Stairs            Wheelchair Mobility    Modified Rankin (Stroke Patients Only)       Balance Overall balance assessment: No apparent balance  deficits (not formally assessed)                                           Pertinent Vitals/Pain Pain Assessment: No/denies pain    Home Living Family/patient expects to be discharged to:: Private residence Living Arrangements: Spouse/significant other Available Help at Discharge: Family;Available PRN/intermittently Type of Home: Mobile home Home Access: Stairs to enter   Entrance Stairs-Number of Steps: 4 Home Layout: One level Home Equipment: None      Prior Function Level of Independence: Independent         Comments: Pt has been I but SOB. Pt has had RN coming in for past month but does not need anyone to assist with adls.     Hand Dominance   Dominant Hand: Right    Extremity/Trunk Assessment   Upper Extremity Assessment: Defer to OT evaluation           Lower Extremity Assessment: Overall WFL for tasks assessed      Cervical / Trunk Assessment: Normal  Communication   Communication: No difficulties  Cognition Arousal/Alertness: Awake/alert Behavior During Therapy: WFL for tasks assessed/performed Overall Cognitive Status: Within Functional Limits for tasks assessed                      General Comments General comments (skin integrity,  edema, etc.): Pt states he fell one time about 6 months ago.  He got up too quickly, became dizzy and fell.  Pt very SOB after walking in hallway but O2 sats were 95 wo O2.      Exercises        Assessment/Plan    PT Assessment Patent does not need any further PT services  PT Diagnosis     PT Problem List    PT Treatment Interventions     PT Goals (Current goals can be found in the Care Plan section) Acute Rehab PT Goals Patient Stated Goal: to get out of here today. PT Goal Formulation: All assessment and education complete, DC therapy    Frequency     Barriers to discharge        Co-evaluation               End of Session   Activity Tolerance: Patient tolerated  treatment well Patient left: in bed;with call bell/phone within reach;with nursing/sitter in room;with family/visitor present           Time: 1610-9604 PT Time Calculation (min) (ACUTE ONLY): 15 min   Charges:   PT Evaluation $Initial PT Evaluation Tier I: 1 Procedure     PT G Codes:        Rebeca Alert, MPT Pager: [phone]

## 2014-06-15 NOTE — Evaluation (Signed)
Clinical/Bedside Swallow Evaluation Patient Details  Name: Nathan KellMaurice W Metoyer MRN: [id number] Date of Birth: 02/22/1941  Today's Date: 06/15/2014 Time: SLP Start Time (ACUTE ONLY): 0945 SLP Stop Time (ACUTE ONLY): 1000 SLP Time Calculation (min) (ACUTE ONLY): 15 min  Past Medical History:  Past Medical History  Diagnosis Date  . Aorto-iliac disease     a. s/p bilat with stent 2007 - followed by [facility].  . CAD (coronary artery disease)     a. 10/2011 s/p CABG x4 (LIMA-LAD, SVG-Diag, SVG-OM1 and SVG-PDA)  b. 03/2012 NSTEMI s/p LHC SVG-RCA and LIMA-LAD but total occlusion of SVG-OM as well as SVG-diag. RX therapy recommended   . Hypertension   . ED (erectile dysfunction)   . COPD, severe   . Hyperlipidemia   . Stroke   . GERD (gastroesophageal reflux disease)   . Gout   . Paroxysmal atrial flutter     ablation  . Chronic combined systolic and diastolic CHF (congestive heart failure)     a. Last echo 04/2014: EF 40-45%.  Marland Kitchen. PAF (paroxysmal atrial fibrillation)   . CKD (chronic kidney disease), stage III   . Tobacco abuse   . History of noncompliance with medical treatment   . Carotid artery disease     a. RCEA 2013 - followed by [facility].  . Anemia   . Lower GI bleed     a. LGIB 10/2012: Colonoscopy performed June 21 showed blood in the colon but no definite source identified, diverticular versus AVM suspected. Aspirin stopped.  . Mitral regurgitation   . Hypertensive heart disease    Past Surgical History:  Past Surgical History  Procedure Laterality Date  . Spinal fusion    . Hand surgery    . Neck fusion  1975  . Eye surgery    . Coronary artery bypass graft  10/31/2011    Procedure: CORONARY ARTERY BYPASS GRAFTING (CABG);  Surgeon: Delight OvensEdward B Gerhardt, MD;  [facility];  Service: Open Heart Surgery;  Laterality: N/A;  times three using  Left Internal Mammary Artery and Right Greater Saphenous Vein Graft harvested endoscopically; TEE  . Endarterectomy  10/31/2011    Procedure:  ENDARTERECTOMY CAROTID;  Surgeon: Nada LibmanVance W Brabham, MD;  [facility];  Service: Vascular;  Laterality: Right;  with patch angioplasty   . Carotid endarterectomy  10-31-2011    right  . Colonoscopy N/A 10/30/2012    Procedure: COLONOSCOPY;  Surgeon: Shirley FriarVincent C. Schooler, MD;  [facility];  Service: Endoscopy;  Laterality: N/A;  . Left heart catheterization with coronary angiogram N/A 09/04/2011    Procedure: LEFT HEART CATHETERIZATION WITH CORONARY ANGIOGRAM;  Surgeon: Lesleigh NoeHenry W Smith III, MD;  [facility];  Service: Cardiovascular;  Laterality: N/A;  . Left heart cath N/A 04/03/2012    Procedure: LEFT HEART CATH;  Surgeon: Lennette Biharihomas A Kelly, MD;  [facility];  Service: Cardiovascular;  Laterality: N/A;   HPI:  74 y.o. male admitted with COPD exacerbation. PMHx of GERD, zenker's diverticulum, remote dysphagia.  Pt followed by SLP services during June 2013 admission for CABG and right CEA.  At that time, he developed an acute dysphagia with aspiration due to right CN X injury - two MBS studies were completed.  Deficits have since resolved and pt has been eating a regular diet without incident per his report.    Assessment / Plan / Recommendation Clinical Impression  Pt presents with a normal oropharyngeal swallow with adequate mastication, consistent swallow response, and no s/s of  compromised airway protection. Swallow- ventilatory coordination appears to be intact.  Recommend continuing current diet - regular/thin liquids- if pills elicit coughing with water, give in applesauce.  No SLP f/u warranted.  Pt agrees.     Aspiration Risk  Mild    Diet Recommendation Regular;Thin liquid   Liquid Administration via: Cup;Straw Medication Administration: Whole meds with liquid Supervision: Patient able to self feed    Other  Recommendations Oral Care Recommendations: Oral care BID   Follow Up Recommendations  None        Swallow Study Prior Functional Status        General Date of Onset: 06/14/14 HPI: 74 y.o. male admitted with COPD exacerbation. PMHx of GERD, zenker's diverticulum, remote dysphagia.  Pt followed by SLP services during June 2013 admission for CABG and right CEA.  At that time, he developed an acute dysphagia with aspiration due to right CN X injury - two MBS studies were completed.  Deficits have since resolved and pt has been eating a regular diet without incident per his report.  Type of Study: Bedside swallow evaluation Previous Swallow Assessment: MBS studies June and July 2013 Diet Prior to this Study: Regular;Thin liquids Temperature Spikes Noted: No Respiratory Status: Nasal cannula Behavior/Cognition: Alert;Cooperative;Pleasant mood Oral Cavity - Dentition: Adequate natural dentition (implants) Self-Feeding Abilities: Able to feed self Patient Positioning: Upright in bed Baseline Vocal Quality: Clear Volitional Cough: Strong Volitional Swallow: Able to elicit    Oral/Motor/Sensory Function Overall Oral Motor/Sensory Function: Appears within functional limits for tasks assessed   Ice Chips Ice chips: Within functional limits   Thin Liquid Thin Liquid: Within functional limits Presentation: Straw    Nectar Thick Nectar Thick Liquid: Not tested   Honey Thick Honey Thick Liquid: Not tested   Puree Puree: Within functional limits   Solid  Yessenia Maillet L. [place], [place] CCC/SLP Pager [phone]     Solid: Within functional limits       [name] 06/15/2014,10:04 AM

## 2014-06-15 NOTE — Progress Notes (Signed)
CARE MANAGEMENT NOTE 06/15/2014  Patient:  Nathan [name],Nathan [name]   Account Number:  [id number]  Date Initiated:  06/15/2014  Documentation initiated by:  DAVIS,RHONDA  Subjective/Objective Assessment:   copd with failed treatment in ed over the past two weeks,     Action/Plan:   home when stable pt also used the [facility] for his medical care.   Anticipated DC Date:  06/18/2014   Anticipated DC Plan:  HOME/SELF CARE      DC Planning Services  CM consult      Choice offered to / List presented to:             Status of service:  In process, will continue to follow Medicare Important Message given?   (If response is "NO", the following Medicare IM given date fields will be blank) Date Medicare IM given:   Medicare IM given by:   Date Additional Medicare IM given:   Additional Medicare IM given by:    Discharge Disposition:    Per UR Regulation:  Reviewed for med. necessity/level of care/duration of stay  If discussed at Long Length of Stay Meetings, dates discussed:    Comments:  [id number]/Rhonda Stark JockDavis, RN, BSN, CCN: [phone]/ Case management. Chart reviewed for discharge planning and present needs. Discharge needs: none present at time of review. Next chart review due:  [id number]

## 2014-06-16 LAB — TROPONIN I
TROPONIN I: 0.04 ng/mL — AB (ref <0.031)
Troponin I: 0.04 ng/mL — ABNORMAL HIGH (ref <0.031)

## 2014-06-16 LAB — BASIC METABOLIC PANEL
ANION GAP: 9 (ref 5–15)
BUN: 42 mg/dL — ABNORMAL HIGH (ref 6–23)
CO2: 24 mmol/L (ref 19–32)
CREATININE: 1.54 mg/dL — AB (ref 0.50–1.35)
Calcium: 8.4 mg/dL (ref 8.4–10.5)
Chloride: 102 mmol/L (ref 96–112)
GFR calc Af Amer: 49 mL/min — ABNORMAL LOW (ref >90)
GFR, EST NON AFRICAN AMERICAN: 42 mL/min — AB (ref >90)
GLUCOSE: 131 mg/dL — AB (ref 70–99)
Potassium: 4.7 mmol/L (ref 3.5–5.1)
Sodium: 135 mmol/L (ref 135–145)

## 2014-06-16 LAB — MAGNESIUM: Magnesium: 2 mg/dL (ref 1.5–2.5)

## 2014-06-16 MED ORDER — ISOSORBIDE DINITRATE 5 MG PO TABS
5.0000 mg | ORAL_TABLET | Freq: Three times a day (TID) | ORAL | Status: DC
  Administered 2014-06-16 – 2014-06-18 (×6): 5 mg via ORAL
  Filled 2014-06-16 (×8): qty 1

## 2014-06-16 MED ORDER — CARVEDILOL 12.5 MG PO TABS
12.5000 mg | ORAL_TABLET | Freq: Two times a day (BID) | ORAL | Status: DC
  Administered 2014-06-16 – 2014-06-18 (×5): 12.5 mg via ORAL
  Filled 2014-06-16 (×5): qty 1

## 2014-06-16 MED ORDER — NITROGLYCERIN 0.4 MG SL SUBL
SUBLINGUAL_TABLET | SUBLINGUAL | Status: AC
  Filled 2014-06-16: qty 1

## 2014-06-16 MED ORDER — ASPIRIN 81 MG PO CHEW
324.0000 mg | CHEWABLE_TABLET | Freq: Once | ORAL | Status: AC
Start: 2014-06-16 — End: 2014-06-16
  Administered 2014-06-16: 324 mg via ORAL
  Filled 2014-06-16: qty 4

## 2014-06-16 MED ORDER — ZOLPIDEM TARTRATE 5 MG PO TABS
5.0000 mg | ORAL_TABLET | Freq: Once | ORAL | Status: AC
  Administered 2014-06-16: 5 mg via ORAL
  Filled 2014-06-16: qty 1

## 2014-06-16 MED ORDER — NITROGLYCERIN 0.4 MG SL SUBL
0.4000 mg | SUBLINGUAL_TABLET | SUBLINGUAL | Status: DC | PRN
  Administered 2014-06-16 (×2): 0.4 mg via SUBLINGUAL

## 2014-06-16 MED ORDER — LEVOFLOXACIN 500 MG PO TABS
250.0000 mg | ORAL_TABLET | Freq: Every day | ORAL | Status: DC
  Administered 2014-06-16 – 2014-06-17 (×2): 250 mg via ORAL
  Filled 2014-06-16 (×2): qty 1

## 2014-06-16 NOTE — Progress Notes (Signed)
Nathan [name]:[id number] DOB: 04/09/1941 DOA: 06/14/2014 PCP: Nathan [name]  Brief narrative: 74 y/o ? 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 Not on AC, bimodal HF, GOLD COPD stg 'C', LGIB 2/2 to AVM's 10/2012, PAD s/p LLE stent 2005/R CEA 2013, continued [facility] with AECOPD +/- AECHF Patient proceeded subsequently on 2/5 to have episodic V. tach 20 beats and then developed chest pain On further questioning patient for the last month has been having chest pain on and off and has been self treating at home with sublingual nitroglycerin.  Past medical history-As per Problem list Chart reviewed as below-   Consultants:  none  Procedures:  none  Antibiotics:  none   Subjective   Had chest pain this morning after breakfast at around 11:30 AM 5/10 in intensity Nitroglycerin given which relieved the pain to 0/10 EKG done at the time showed no ST-T wave changes He currently is chest pain-free and is resting He has tolerated a diet today He states that the pain felt like something was down in his throat got stuck. He describes it also has a pressure He has no other symptoms or complaints     Objective    Interim History:   Telemetry: Sinus rhythm    Objective: Filed Vitals:   06/16/14 1151 06/16/14 1240 06/16/14 1337 06/16/14 1559  BP: 134/77 133/81 131/73   Pulse: 83 85 77   Temp:  98.2 F (36.8 C) 97.5 F (36.4 C)   TempSrc:  Axillary Axillary   Resp: 24 22 20    Height:      Weight:      SpO2: 98% 99% 99% 99%    Intake/Output Summary (Last 24 hours) at 06/16/14 1614 Last data filed at 06/16/14 1200  Gross per 24 hour  Intake    480 ml  Output   1225 ml  Net   -745 ml    Exam:  General: EOMI NCAT  Cardiovascular: S1-S2 no murmur rub or gallop  Respiratory: Clinically clear  Abdomen: Soft nontender nondistended no rebound  Skinno lower extremity edema    Neurogrossly intact   Data Reviewed: Basic Metabolic Panel:  Recent Labs Lab 06/14/14 1758 06/15/14 0440 06/16/14 0505  NA 140 138 135  K 4.3 4.3 4.7  CL 104 103 102  CO2 28 26 24   GLUCOSE 90 132* 131*  BUN 26* 29* 42*  CREATININE 1.41* 1.43* 1.54*  CALCIUM 8.6 8.6 8.4  MG  --   --  2.0   Liver Function Tests:  Recent Labs Lab 06/14/14 1758  AST 22  ALT 38  ALKPHOS 64  BILITOT 0.9  PROT 6.2  ALBUMIN 3.4*   No results for input(s): LIPASE, AMYLASE in the last 168 hours. No results for input(s): AMMONIA in the last 168 hours. CBC:  Recent Labs Lab 06/14/14 1758 06/15/14 0440  WBC 10.7* 8.0  NEUTROABS 9.3*  --   HGB 12.8* 13.1  HCT 42.4 42.1  MCV 85.0 83.2  PLT 171 182   Cardiac Enzymes:  Recent Labs Lab 06/16/14 1228  TROPONINI 0.04*   BNP: Invalid input(s): POCBNP CBG: No results for input(s): GLUCAP in the last 168 hours.  No results found for this or any previous visit (from the past 240 hour(s)).   Studies:              All Imaging reviewed and is as per above  notation   Scheduled Meds: . atorvastatin  10 mg Oral q1800  . carvedilol  12.5 mg Oral BID WC  . colchicine  0.6 mg Oral Daily  . guaiFENesin  600 mg Oral BID  . heparin  5,000 Units Subcutaneous 3 times per day  . ipratropium  0.5 mg Nebulization Q4H  . isosorbide dinitrate  5 mg Oral TID  . levalbuterol  0.63 mg Nebulization Q4H  . levofloxacin  250 mg Oral QHS  . mometasone-formoterol  2 puff Inhalation BID  . predniSONE  60 mg Oral QAC breakfast  . sodium chloride  3 mL Intravenous Q12H  . sodium chloride  3 mL Intravenous Q12H   Continuous Infusions:    Assessment/Plan:  1. Possible cardiogenic chest pain-point of care troponin this afternoon 0.04 . I will trend this. If his troponin peaks any further we will formally consult cardiology. I already have added Isordil 5 mg 3 times a day as he did have resolution of his chest discomfort from sublingual nitroglycerin.  Because of his V. tach overnight I increased his Coreg from 6.25--12.5 06/16/14 a.m.    2. AECOPD-transitioned from IV Solu-Medrol in a.m. to prednisone 60 mg daily. Continue ipratropium 0.5 every 4, Xopenex U4 scheduled and guaifenesin 600 twice a day. In addition we will add long-acting steroid mometasone as well.  IV to PO levaquin 250 secondary to renal insufficiency 3. .Acute decompensation of bimodal Heart failure, echocardiogram 04/22/14 = EF 40-45% with inferior lateral akinesis, LVH noted, aortic valve sclerosis and mild mitral regurg diuretics held 06/16/14 secondary to rising creatinine. Continue Coreg 6.25 bID with meals. His weight on last admission was 174 pounds he currently is at 186--fluid restrict 1500 cc, strict I/O,currently -3.0 liters 4. AKI/Ck-Glt Equation CKD stg 2-diuretics on hold, fluids on hold for now-recheck of basic metabolic panel in a.m. 5. Medical  noncompliance There is a lot of uncertainty as to what medications he takes--He has not seen the advanced heart failure clinic in almost a year and this may be time for him to revisit them? 6. Paroxysmal atrial fibrillation, Chad2=6-not on anticoagulation.  Outpatient follow-up with regular cardiologist  7.  lower GI bleed 10/2012-no anticoagulation 8. PAD status post left lower extremity stent 2005, R CEA 2013-interval screening as an outpatient-made benefit from being on Pletal/aspirin higher dose? He truly does need to quit smoking-he tells me that he is scheduled to follow-up with smoking cessation classes at [facility] and at the [place]  9. Gout-continue colchicine 0.6 daily 10. Hyperlipidemia-continue atorvastatin 10 daily    Code Status: Full I got through to the patient's son Nathan [name] on the patient's cell phone and updated him has 2 changes with regards to chest pain and other issues. I will update him further when possible  Inpatient    Pleas Koch, [name]  [facility] Pager [phone] 06/16/2014, 4:14 PM    LOS: 2  days

## 2014-06-16 NOTE — Progress Notes (Signed)
Pt had a 20 beat run of SVT. Pt asymptomatic and back in NSR. NP on call notified, new orders placed will continue to monitor.

## 2014-06-17 LAB — BASIC METABOLIC PANEL
ANION GAP: 7 (ref 5–15)
BUN: 51 mg/dL — AB (ref 6–23)
CHLORIDE: 104 mmol/L (ref 96–112)
CO2: 25 mmol/L (ref 19–32)
CREATININE: 1.81 mg/dL — AB (ref 0.50–1.35)
Calcium: 8.4 mg/dL (ref 8.4–10.5)
GFR calc non Af Amer: 35 mL/min — ABNORMAL LOW (ref >90)
GFR, EST AFRICAN AMERICAN: 40 mL/min — AB (ref >90)
GLUCOSE: 109 mg/dL — AB (ref 70–99)
POTASSIUM: 4.7 mmol/L (ref 3.5–5.1)
Sodium: 136 mmol/L (ref 135–145)

## 2014-06-17 LAB — TROPONIN I: Troponin I: 0.03 ng/mL (ref <0.031)

## 2014-06-17 LAB — CREATININE, URINE, RANDOM: CREATININE, URINE: 157.52 mg/dL

## 2014-06-17 LAB — SODIUM, URINE, RANDOM: Sodium, Ur: 14 mmol/L

## 2014-06-17 LAB — OSMOLALITY, URINE: Osmolality, Ur: 854 mOsm/kg (ref 390–1090)

## 2014-06-17 MED ORDER — SODIUM CHLORIDE 0.9 % IV SOLN
INTRAVENOUS | Status: DC
  Administered 2014-06-17: 50 mL/h via INTRAVENOUS
  Administered 2014-06-18: 07:00:00 via INTRAVENOUS

## 2014-06-17 NOTE — Progress Notes (Signed)
Nathan [name]:[id number] DOB: [birth year] DOA: 06/14/2014 PCP: Nathan Mountain, MD  Brief narrative: 74 y/o ? 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 Not on AC, bimodal HF, GOLD COPD stg 'C', LGIB 2/2 to AVM's 10/2012, PAD s/p LLE stent 2005/R CEA 2013, continued smoker admitted to [facility] 06/14/14 with AECOPD +/- AECHF Patient proceeded subsequently on 2/5 to have episodic V. tach 20 beats and then developed chest pain. On further questioning patient for the last month has been having chest pain on and off and has been self treating at home with sublingual nitroglycerin.  Past medical history-As per Problem list Chart reviewed as below-   Consultants:  none  Procedures:  none  Antibiotics:  none   Subjective   Patient doing well wants to go home States that he ambulated earlier today and did okay Ambulating without oxygen however around the unit sats dropped to 86%  No nausea no vomiting No chest pain right now   Objective    Interim History:   Telemetry: Sinus rhythm    Objective: Filed Vitals:   06/17/14 0538 06/17/14 0742 06/17/14 1233 06/17/14 1437  BP: 138/77   124/75  Pulse: 82   77  Temp: 97.7 F (36.5 C)   98.2 F (36.8 C)  TempSrc: Oral   Oral  Resp: 20   18  Height:      Weight: 84.142 kg (185 lb 8 oz)     SpO2: 99% 97% 98% 98%    Intake/Output Summary (Last 24 hours) at 06/17/14 1538 Last data filed at 06/17/14 1437  Gross per 24 hour  Intake      0 ml  Output    600 ml  Net   -600 ml    Exam:  General: EOMI NCAT  Cardiovascular: S1-S2 no murmur rub or gallop  Respiratory: Clinically clear no added sound as an time- Abdomen: Soft nontender nondistended no rebound  Skinno lower extremity edema  Neurogrossly intact   Data Reviewed: Basic Metabolic Panel:  Recent Labs Lab 06/14/14 1758 06/15/14 0440 06/16/14 0505 06/17/14 0516  NA 140 138 135 136  K 4.3 4.3 4.7 4.7  CL 104  103 102 104  CO2 GLUCOSE 90 132* 131* 109*  BUN 26* 29* 42* 51*  CREATININE 1.41* 1.43* 1.54* 1.81*  CALCIUM 8.6 8.6 8.4 8.4  MG  --   --  2.0  --    Liver Function Tests:  Recent Labs Lab 06/14/14 1758  AST 22  ALT 38  ALKPHOS 64  BILITOT 0.9  PROT 6.2  ALBUMIN 3.4*   No results for input(s): LIPASE, AMYLASE in the last 168 hours. No results for input(s): AMMONIA in the last 168 hours. CBC:  Recent Labs Lab 06/14/14 1758 06/15/14 0440  WBC 10.7* 8.0  NEUTROABS 9.3*  --   HGB 12.8* 13.1  HCT 42.4 42.1  MCV 85.0 83.2  PLT 171 182   Cardiac Enzymes:  Recent Labs Lab 06/16/14 1228 06/16/14 1754 06/16/14 2330  TROPONINI 0.04* 0.04* 0.03   BNP: Invalid input(s): POCBNP CBG: No results for input(s): GLUCAP in the last 168 hours.  No results found for this or any previous visit (from the past 240 hour(s)).   Studies:              All Imaging reviewed and is as per above notation   Scheduled Meds: . atorvastatin  10  mg Oral q1800  . carvedilol  12.5 mg Oral BID WC  . colchicine  0.6 mg Oral Daily  . guaiFENesin  600 mg Oral BID  . heparin  5,000 Units Subcutaneous 3 times per day  . ipratropium  0.5 mg Nebulization Q4H  . isosorbide dinitrate  5 mg Oral TID  . levalbuterol  0.63 mg Nebulization Q4H  . levofloxacin  250 mg Oral QHS  . mometasone-formoterol  2 puff Inhalation BID  . predniSONE  60 mg Oral QAC breakfast  . sodium chloride  3 mL Intravenous Q12H  . sodium chloride  3 mL Intravenous Q12H   Continuous Infusions: . sodium chloride 50 mL/hr (06/17/14 0841)     Assessment/Plan:  1. Cardiogenic chest pain-point of care troponin2/6/16 = 0.04   continuel 5 mg 3 times a day as he did have resolution of his chest discomfort from sublingual nitroglycerin. Because of his V. tach overnight I increased his Coreg from 6.25--12.5 06/16/14 a.m. he is stable from this standpoint   2. AECOPD-transitioned from IV Solu-Medrol in a.m. to  prednisone 60 mg daily. Continue ipratropium 0.5 every 4, Xopenex U4 scheduled and guaifenesin 600 twice a day. In addition we will add long-acting steroid mometasone as well.  IV to PO levaquin 250 secondary to renal insufficiency 3. .Acute decompensation of bimodal Heart failure, echocardiogram 04/22/14 = EF 40-45% with inferior lateral akinesis, LVH noted, aortic valve sclerosis and mild mitral regurg diuretics held 06/16/14 secondary to rising creatinine. Continue Coreg  bID with meals. His weight on last admission was 174 pounds he currently is at 186--fluid restrict 1500 cc, strict I/O,currently -3.6 liters 4. AKI/Ck-Glt Equation CKD stg 2-diuretics on hold,started IV saline 06/17/14 . Repeat bmet a.m.  5. Medical noncompliance There is a lot of uncertainty as to what medications he takes--He has not seen the advanced heart failure clinic in almost a year and this may be time for him to revisit them? 6. Paroxysmal atrial fibrillation, Chad2=6-not on anticoagulation.  Outpatient follow-up with regular cardiologist. 7.  lower GI bleed 10/2012-no anticoagulation 8. PAD status post left lower extremity stent 2005, R CEA 2013-interval screening as an outpatient-made benefit from being on Pletal/aspirin higher dose? He truly does need to quit smoking-he tells me that he is scheduled to follow-up with [facility] cessation [facility] at [facility] [place]  9. Gout-continue colchicine 0.6 daily 10. Hyperlipidemia-continue atorvastatin 10 daily    Code Status: Full No family at the bedside Inpatient-likely discharge a.m. if basic metabolic panel is improved, may need home oxygen if desats again   Pleas KochJai Quamir Willemsen, MD  [facility] Pager [phone] 06/17/2014, 3:38 PM    LOS: 3 days

## 2014-06-18 LAB — BASIC METABOLIC PANEL
ANION GAP: 7 (ref 5–15)
BUN: 50 mg/dL — ABNORMAL HIGH (ref 6–23)
CALCIUM: 8.5 mg/dL (ref 8.4–10.5)
CO2: 28 mmol/L (ref 19–32)
Chloride: 105 mmol/L (ref 96–112)
Creatinine, Ser: 1.77 mg/dL — ABNORMAL HIGH (ref 0.50–1.35)
GFR calc Af Amer: 42 mL/min — ABNORMAL LOW (ref >90)
GFR calc non Af Amer: 36 mL/min — ABNORMAL LOW (ref >90)
Glucose, Bld: 94 mg/dL (ref 70–99)
POTASSIUM: 4.7 mmol/L (ref 3.5–5.1)
Sodium: 140 mmol/L (ref 135–145)

## 2014-06-18 MED ORDER — ISOSORBIDE MONONITRATE ER 30 MG PO TB24: 15.0000 mg | ORAL_TABLET | Freq: Every day | ORAL | Status: DC

## 2014-06-18 MED ORDER — ALBUTEROL SULFATE HFA 108 (90 BASE) MCG/ACT IN AERS: 1.0000 | INHALATION_SPRAY | Freq: Four times a day (QID) | RESPIRATORY_TRACT | Status: DC | PRN

## 2014-06-18 MED ORDER — LORAZEPAM 0.5 MG PO TABS
0.5000 mg | ORAL_TABLET | Freq: Once | ORAL | Status: AC
  Administered 2014-06-18: 0.5 mg via ORAL
  Filled 2014-06-18: qty 1

## 2014-06-18 MED ORDER — EPLERENONE 25 MG PO TABS: 12.5000 mg | ORAL_TABLET | Freq: Every day | ORAL | Status: DC

## 2014-06-18 MED ORDER — TIOTROPIUM BROMIDE MONOHYDRATE 18 MCG IN CAPS: 18.0000 ug | ORAL_CAPSULE | Freq: Every day | RESPIRATORY_TRACT | Status: AC

## 2014-06-18 MED ORDER — PREDNISONE 10 MG PO TABS: 20.0000 mg | ORAL_TABLET | Freq: Two times a day (BID) | ORAL | Status: DC

## 2014-06-18 MED ORDER — FUROSEMIDE 40 MG PO TABS: 40.0000 mg | ORAL_TABLET | Freq: Every day | ORAL | Status: DC

## 2014-06-18 MED ORDER — CARVEDILOL 12.5 MG PO TABS: 12.5000 mg | ORAL_TABLET | Freq: Two times a day (BID) | ORAL | Status: DC

## 2014-06-18 MED ORDER — MOMETASONE FURO-FORMOTEROL FUM 100-5 MCG/ACT IN AERO: 2.0000 | INHALATION_SPRAY | Freq: Two times a day (BID) | RESPIRATORY_TRACT | Status: DC

## 2014-06-18 NOTE — Progress Notes (Signed)
Patient discharged home with wife, discharge instructions given and explained to patient/wife and they verbalized understanding, patient denies any pain/distress, accompanied home by wife, transported to the car by staff via wheelchair. No wound noted, skin intact.

## 2014-06-18 NOTE — Discharge Summary (Signed)
Physician Discharge Summary  Nathan [name]:[id number] DOB: 02-27-1941 DOA: 06/14/2014  PCP: Lillia Mountain, MD  Admit date: 06/14/2014 Discharge date: 06/18/2014  Time spent: 45 minutes  Recommendations for Outpatient Follow-up:  1. Patient needs outpatient basic metabolic panel in less than 5 days 2. I have instructed him to force fluids for 2 days as he is insistent on watching the Super Bowl this evening at home and wants to leave the hospital 3. He has been instructed to start back his Aldactone on 2/15/165 4. He has been instructed to start back Lasix on 06/23/14 5. He is at high risk for readmission because of noncompliance and confusion about medications-we will ask home health RN to check in on him 6. He definitely needs to quit smoking 7. He may need an ischemic workup as an outpatient-he suffers from on and off chest pain and I started him on low-dose Isordil during this admission but then changed it on discharge to Imdur 15 mg daily.   8. I will forward this note to his cardiologist as well as his primary care physician 9. He will complete a course of steroids in 10 days of 40 mg prednisone 10. I discontinued his Levaquin for AE COPD given resolution of his sputum  11. He does not require home oxygen but has been instructed to eat less salt , and take it easy for a couple of days    Discharge Diagnoses:  Principal Problem:   Acute exacerbation of chronic obstructive pulmonary disease (COPD) Active Problems:   COPD (chronic obstructive pulmonary disease), gold stage C.   Chronic kidney disease (CKD), stage III (moderate)   PAF (paroxysmal atrial fibrillation)   CAD (coronary artery disease)   Chronic combined systolic and diastolic CHF (congestive heart failure)   Discharge Condition:  fair  Diet recommendation:  heart healthy low-salt  Filed Weights   06/16/14 0442 06/17/14 0538 06/18/14 0509  Weight: 84.732 kg (186 lb 12.8 oz) 84.142 kg (185 lb 8 oz) 85.186 kg  (187 lb 12.8 oz)    History of present illness:   74 y/o ? 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 Not on AC, bimodal HF, GOLD COPD stg 'C', LGIB 2/2 to AVM's 10/2012, PAD s/p LLE stent 2005/R CEA 2013, continued smoker admitted to [facility] 06/14/14 with AECOPD +/- AECHF Patient proceeded subsequently on 2/5 to have episodic V. tach 20 beats and then developed chest pain. On further questioning patient for the last month has been having chest pain on and off and has been self treating at home with sublingual nitroglycerin.  Hospital Course:    1. Stable angina- peak troponin 06/17/14 = 0.04started Isordil 5 mg 3 times a day as he did have resolution of his chest discomfort from sublingual nitroglycerin. This was transitionedto Imdur 15 mg on discharge.  He may need ischemic workup/Myoview stress test as an outpatient and I will forward this note to Dr. Verdis Prime 2. Multifactorial dyspnea as below 3. AECOPD-transitioned from IV Solu-Medrol in a.m. to prednisone 60 mg daily, on discharge this was transitioned to 40 mg daily to complete on 06/28/14 = 10 days.. Continue ipratropium 0.5 every 4, patient given prescriptions for albuterol inhaler as an outpatient. In addition added on discharge mometasone as well. Levaquin discontinued on discharge 4. .Acute decompensation of bimodal Heart failure, echocardiogram 04/22/14 = EF 40-45% with inferior lateral akinesis, LVH noted, aortic valve sclerosis and mild mitral regurg diuretics  held 06/16/14 secondary to rising creatinine. Continue Coreg bID with meals. His weight on last admission was 174 pounds he currently is at 186--fluid restrict 1500 cc, strict I/O,currently -3.6 liters 5. AKI/Ck-Glt Equation CKD stg 2-diuretics on hold,started IV saline 06/17/14 . Patient was insistent on discharge as he wanted to watch the Super Bowl this afternoon and I have warned him that he needs to watch his fluid intake and take at  least 2 L per day for the next 2 days and then start back cautiously on his regular diuretic. 6. Medical noncompliance There is a lot of uncertainty as to what medications he takes--He has not seen the advanced heart failure clinic in almost a year and this may be time for him to revisit them--I will let Dr. Katrinka BlazingSmith determine this 7. Paroxysmal atrial fibrillation, Chad2=6-not on anticoagulation. Outpatient follow-up with regular cardiologist. 8. lower GI bleed 10/2012-no anticoagulation 9. PAD status post left lower extremity stent 2005, R CEA 2013-interval screening as an outpatient-made benefit from being on Pletal/aspirin higher dose? He truly does need to quit smoking-he tells me that he is scheduled to follow-up with smoking cessation [facility]  10. Gout-continue colchicine 0.6 daily 11. Hyperlipidemia-continue atorvastatin 10 daily    Discharge Exam: Filed Vitals:   06/18/14 0803  BP: 136/72  Pulse: 77  Temp:   Resp:     General: alert pleasant orietned Ambulated around the unit without desaturation Cardiovascular: S1-S2 no murmur rub or gallop Respiratory: Clinically clear  Discharge Instructions   Discharge Instructions    Diet - low sodium heart healthy    Complete by:  As directed      Discharge instructions    Complete by:  As directed   Smoking is an addiction-it is harder to quit smoking than it is to quit crack cocaine or heroin--for your sake, we strongly advise that you try to quit smoking with the help line I have prescribed for U prednisone twice a day for 10 days. You may stop taking the prednisone after that All of your medications have been refilled for your appropriately.  You will need to pick up these prescriptions on her way home. You will need to look at these medications carefully and make sure that you are taking them when you have when you have been instructed to take them I'm sending out a home health nurse to check on you to  ensure that you are taking your medications appropriately I'm discharging her contingent on the the promise that you made that you will go and see Dr. Katrinka BlazingSmith in about one week I will inform Dr. Katrinka BlazingSmith of your admission here Best of luck and enjoy your Super Bowl party     Increase activity slowly    Complete by:  As directed           Current Discharge Medication List    START taking these medications   Details  isosorbide mononitrate (IMDUR) 30 MG 24 hr tablet Take 0.5 tablets (15 mg total) by mouth daily. Qty: 30 tablet, Refills: 0    mometasone-formoterol (DULERA) 100-5 MCG/ACT AERO Inhale 2 puffs into the lungs 2 (two) times daily. Qty: 1 Inhaler, Refills: 0      CONTINUE these medications which have CHANGED   Details  albuterol (PROVENTIL HFA;VENTOLIN HFA) 108 (90 BASE) MCG/ACT inhaler Inhale 1-2 puffs into the lungs every 6 (six) hours as needed for wheezing or shortness of breath. Qty: 1 Inhaler, Refills: 0  carvedilol (COREG) 12.5 MG tablet Take 1 tablet (12.5 mg total) by mouth 2 (two) times daily with a meal. Qty: 60 tablet, Refills: 0    eplerenone (INSPRA) 25 MG tablet Take 0.5 tablets (12.5 mg total) by mouth daily. Qty: 20 tablet, Refills: 0    furosemide (LASIX) 40 MG tablet Take 1 tablet (40 mg total) by mouth daily. Qty: 30 tablet, Refills: 0    predniSONE (DELTASONE) 10 MG tablet Take 2 tablets (20 mg total) by mouth 2 (two) times daily. Qty: 20 tablet, Refills: 0    tiotropium (SPIRIVA) 18 MCG inhalation capsule Place 1 capsule (18 mcg total) into inhaler and inhale daily. Qty: 30 capsule, Refills: 12      CONTINUE these medications which have NOT CHANGED   Details  albuterol (PROVENTIL) (2.5 MG/3ML) 0.083% nebulizer solution Take 3 mLs (2.5 mg total) by nebulization every 4 (four) hours as needed for wheezing or shortness of breath. Qty: 30 vial, Refills: 0    aspirin 81 MG chewable tablet Chew 1 tablet (81 mg total) by mouth daily. Qty: 30 tablet,  Refills: 1    atorvastatin (LIPITOR) 10 MG tablet Take 1 tablet (10 mg total) by mouth daily at 6 PM. Qty: 30 tablet, Refills: 1    buPROPion (WELLBUTRIN XL) 300 MG 24 hr tablet Take 300 mg by mouth daily.    colchicine 0.6 MG tablet Take 0.6 mg by mouth daily.    nitroGLYCERIN (NITROSTAT) 0.4 MG SL tablet Place 0.4 mg under the tongue every 5 (five) minutes as needed for chest pain.      STOP taking these medications     metoprolol tartrate (LOPRESSOR) 25 MG tablet      azithromycin (ZITHROMAX Z-PAK) 250 MG tablet      losartan (COZAAR) 25 MG tablet      potassium chloride SA (K-DUR,KLOR-CON) 20 MEQ tablet        No Known Allergies    The results of significant diagnostics from this hospitalization (including imaging, microbiology, ancillary and laboratory) are listed below for reference.    Significant Diagnostic Studies: Dg Chest 2 View  06/14/2014   CLINICAL DATA:  Worsening shortness of breath  EXAM: CHEST  2 VIEW  COMPARISON:  06/06/2014  FINDINGS: Bilateral pleural effusions, moderate on the right and small on the left. The left effusion has slightly increased from comparison study, with loculation along the lateral and posterior chest wall. The underlying lung is obscured at the right base. There are pleural calcifications at the bilateral apex, but none definitely seen at the bases.  Stable heart size and aortic contours post CABG.  No pulmonary edema.  No pneumothorax.  IMPRESSION: Moderate right and small left pleural effusions. The partially loculated left pleural effusion has mildly increased since 06/06/2014.   Electronically Signed   By: Tiburcio Pea M.D.   On: 06/14/2014 18:29   Dg Chest 2 View (if Patient Has Fever And/or Copd)  06/06/2014   CLINICAL DATA:  Chest pain and shortness of breath  EXAM: CHEST  2 VIEW  COMPARISON:  04/23/2014  FINDINGS: Cardiac shadow is stable but mildly enlarged. Postsurgical changes are again seen. Bilateral pleural effusions are  noted which appears stable when compare with the prior study. Likely underlying atelectasis is present. No new focal abnormality is seen.  IMPRESSION: Stable bilateral pleural effusions. No new focal abnormality is noted.   Electronically Signed   By: Alcide Clever M.D.   On: 06/06/2014 19:40   Ct Chest Wo  Contrast  06/14/2014   CLINICAL DATA:  74 year old male with progressive shortness of breath over the past month and bilateral pleural effusions  EXAM: CT CHEST WITHOUT CONTRAST  TECHNIQUE: Multidetector CT imaging of the chest was performed following the standard protocol without IV contrast.  COMPARISON:  Prior chest x-ray 06/14/2014  FINDINGS: Mediastinum: Unremarkable thyroid gland. Mediastinal lymph nodes remain within normal limits for size. High right paratracheal node measures 9 mm in short axis on image 17 of series 2. Unremarkable thoracic esophagus.  Heart/Vascular: Limited evaluation in the absence of intravenous contrast. Conventional 3 vessel arch anatomy. Scattered atherosclerotic calcifications including coronary artery disease. Evidence of prior multivessel CABG. The heart is enlarged. No pericardial effusion.  Lungs/Pleura: Focal calcified pleural plaques in the posteromedial aspect of the left lower thorax and at the right lung apex. Small and partially loculated left pleural effusion with pleural fluid extending through the fissure. Moderate right pleural effusion. Associated dependent atelectasis in the right greater than left lower lobes. No pulmonary edema, focal airspace consolidation or suspicious pulmonary nodule. Diffuse mild lower lobe bronchial wall thickening.  Bones/Soft Tissues: Chronic nonunion of the median sternotomy. The cerclage wires remain intact. No acute fracture or aggressive appearing lytic or blastic osseous lesion.  Upper Abdomen: Visualized upper abdominal organs are unremarkable.  IMPRESSION: 1. Moderate right and small low but loculated left pleural effusions. No  definitive etiology for the pleural fluid is identified by CT imaging. Given the partial loculation on the left, chronic effusions perhaps related to prior episodes of congestive heart failure is favored. 2. Associated chronic right greater than left lower lobe atelectasis. 3. 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 This may predispose to chronic pleural effusion formation. 4. Atherosclerosis including coronary artery disease. 5. Chronic nonunion of the median sternotomy. Cerclage wires remain intact. 6. Diffuse mild lower lobe bronchial wall thickening.   Electronically Signed   By: Malachy Moan M.D.   On: 06/14/2014 19:34    Microbiology: No results found for this or any previous visit (from the past 240 hour(s)).   Labs: Basic Metabolic Panel:  Recent Labs Lab 06/14/14 1758 06/15/14 0440 06/16/14 0505 06/17/14 0516 06/18/14 0523  NA 140 138 135 136 140  K 4.3 4.3 4.7 4.7 4.7  CL 104 103 102 104 105  CO2 28 26 24 25 28   GLUCOSE 90 132* 131* 109* 94  BUN 26* 29* 42* 51* 50*  CREATININE 1.41* 1.43* 1.54* 1.81* 1.77*  CALCIUM 8.6 8.6 8.4 8.4 8.5  MG  --   --  2.0  --   --    Liver Function Tests:  Recent Labs Lab 06/14/14 1758  AST 22  ALT 38  ALKPHOS 64  BILITOT 0.9  PROT 6.2  ALBUMIN 3.4*   No results for input(s): LIPASE, AMYLASE in the last 168 hours. No results for input(s): AMMONIA in the last 168 hours. CBC:  Recent Labs Lab 06/14/14 1758 06/15/14 0440  WBC 10.7* 8.0  NEUTROABS 9.3*  --   HGB 12.8* 13.1  HCT 42.4 42.1  MCV 85.0 83.2  PLT 171 182   Cardiac Enzymes:  Recent Labs Lab 06/16/14 1228 06/16/14 1754 06/16/14 2330  TROPONINI 0.04* 0.04* 0.03   BNP: BNP (last 3 results) No results for input(s): BNP in the last 8760 hours.  ProBNP (last 3 results)  Recent Labs  01/04/14 0450 04/21/14 1504  PROBNP 3786.0* 13092.0*    CBG: No results for input(s): GLUCAP  in the last 168  hours.     SignedRhetta Mura  [facility] 06/18/2014, 12:35 PM

## 2014-06-20 ENCOUNTER — Ambulatory Visit (INDEPENDENT_AMBULATORY_CARE_PROVIDER_SITE_OTHER): Payer: Medicare PPO | Admitting: Interventional Cardiology

## 2014-06-20 ENCOUNTER — Encounter: Payer: Self-pay | Admitting: Interventional Cardiology

## 2014-06-20 VITALS — BP 142/76 | HR 76 | Ht 73.0 in | Wt 200.0 lb

## 2014-06-20 DIAGNOSIS — Z951 Presence of aortocoronary bypass graft: Secondary | ICD-10-CM

## 2014-06-20 DIAGNOSIS — I25119 Atherosclerotic heart disease of native coronary artery with unspecified angina pectoris: Secondary | ICD-10-CM

## 2014-06-20 DIAGNOSIS — N183 Chronic kidney disease, stage 3 unspecified: Secondary | ICD-10-CM

## 2014-06-20 DIAGNOSIS — I5042 Chronic combined systolic (congestive) and diastolic (congestive) heart failure: Secondary | ICD-10-CM

## 2014-06-20 DIAGNOSIS — I4892 Unspecified atrial flutter: Secondary | ICD-10-CM

## 2014-06-20 DIAGNOSIS — I480 Paroxysmal atrial fibrillation: Secondary | ICD-10-CM

## 2014-06-20 DIAGNOSIS — K922 Gastrointestinal hemorrhage, unspecified: Secondary | ICD-10-CM

## 2014-06-20 MED ORDER — EPLERENONE 25 MG PO TABS: 12.5000 mg | ORAL_TABLET | Freq: Every day | ORAL | Status: DC

## 2014-06-20 MED ORDER — FUROSEMIDE 40 MG PO TABS: 40.0000 mg | ORAL_TABLET | Freq: Every day | ORAL | Status: AC

## 2014-06-20 NOTE — Progress Notes (Signed)
Patient ID: Nathan [name], male   DOB: 09/21/1940, 74 y.o.   MRN: [id number]    Cardiology Office Note   Date:  06/20/2014   ID:  Nathan [name], DOB 04/04/1941, MRN [id number]  PCP:  Lillia MountainGRIFFIN,JOHN JOSEPH, MD  Cardiologist:   Lesleigh NoeSMITH III,HENRY W, MD   No chief complaint on file.     History of Present Illness: Nathan [name] is a 74 y.o. male who presents for chronic combined systolic and diastolic heart failure, CAD with recent angina, recent exacerbation of COPD, chronic kidney disease. Hospitalized last week the attending physician discontinued his diuretic. He now complains of lower extremity edema. The reason for diuretic discontinuation was a creatinine of 1.88. The patient denies recurrent severe dyspnea, orthopnea, and chest pain since discharge from the hospital. He is compliant with his medication regimen.    Past Medical History  Diagnosis Date  . Aorto-iliac disease     a. s/p bilat with stent 2007 - followed by [facility].  . CAD (coronary artery disease)     a. 10/2011 s/p CABG x4 (LIMA-LAD, SVG-Diag, SVG-OM1 and SVG-PDA)  b. 03/2012 NSTEMI s/p LHC SVG-RCA and LIMA-LAD but total occlusion of SVG-OM as well as SVG-diag. RX therapy recommended   . Hypertension   . ED (erectile dysfunction)   . COPD, severe   . Hyperlipidemia   . Stroke   . GERD (gastroesophageal reflux disease)   . Gout   . Paroxysmal atrial flutter     ablation  . Chronic combined systolic and diastolic CHF (congestive heart failure)     a. Last echo 04/2014: EF 40-45%.  Marland Kitchen. PAF (paroxysmal atrial fibrillation)   . CKD (chronic kidney disease), stage III   . Tobacco abuse   . History of noncompliance with medical treatment   . Carotid artery disease     a. RCEA 2013 - followed by [facility].  . Anemia   . Lower GI bleed     a. LGIB 10/2012: Colonoscopy performed June 21 showed blood in the colon but no definite source identified, diverticular versus AVM suspected. Aspirin stopped.  . Mitral regurgitation     . Hypertensive heart disease     Past Surgical History  Procedure Laterality Date  . Spinal fusion    . Hand surgery    . Neck fusion  1975  . Eye surgery    . Coronary artery bypass graft  10/31/2011    Procedure: CORONARY ARTERY BYPASS GRAFTING (CABG);  Surgeon: Delight OvensEdward B Gerhardt, MD;  [facility];  Service: Open Heart Surgery;  Laterality: N/A;  times three using  Left Internal Mammary Artery and Right Greater Saphenous Vein Graft harvested endoscopically; TEE  . Endarterectomy  10/31/2011    Procedure: ENDARTERECTOMY CAROTID;  Surgeon: Nada LibmanVance W Brabham, MD;  [facility];  Service: Vascular;  Laterality: Right;  with patch angioplasty   . Carotid endarterectomy  10-31-2011    right  . Colonoscopy N/A 10/30/2012    Procedure: COLONOSCOPY;  Surgeon: Shirley FriarVincent C. Schooler, MD;  [facility];  Service: Endoscopy;  Laterality: N/A;  . Left heart catheterization with coronary angiogram N/A 09/04/2011    Procedure: LEFT HEART CATHETERIZATION WITH CORONARY ANGIOGRAM;  Surgeon: Lesleigh NoeHenry W Smith III, MD;  [facility];  Service: Cardiovascular;  Laterality: N/A;  . Left heart cath N/A 04/03/2012    Procedure: LEFT HEART CATH;  Surgeon: Lennette Biharihomas A Kelly, MD;  [facility];  Service: Cardiovascular;  Laterality: N/A;  Current Outpatient Prescriptions  Medication Sig Dispense Refill  . albuterol (PROVENTIL HFA;VENTOLIN HFA) 108 (90 BASE) MCG/ACT inhaler Inhale 1-2 puffs into the lungs every 6 (six) hours as needed for wheezing or shortness of breath. 1 Inhaler 0  . albuterol (PROVENTIL) (2.5 MG/3ML) 0.083% nebulizer solution Take 3 mLs (2.5 mg total) by nebulization every 4 (four) hours as needed for wheezing or shortness of breath. 30 vial 0  . aspirin 81 MG chewable tablet Chew 1 tablet (81 mg total) by mouth daily. 30 tablet 1  . atorvastatin (LIPITOR) 10 MG tablet Take 1 tablet (10 mg total) by mouth daily at 6 PM. 30 tablet 1  . buPROPion (WELLBUTRIN XL) 300 MG  24 hr tablet Take 300 mg by mouth daily.    . carvedilol (COREG) 12.5 MG tablet Take 1 tablet (12.5 mg total) by mouth 2 (two) times daily with a meal. 60 tablet 0  . colchicine 0.6 MG tablet Take 0.6 mg by mouth daily.    Marland Kitchen eplerenone (INSPRA) 25 MG tablet Take 0.5 tablets (12.5 mg total) by mouth daily. 15 tablet 5  . furosemide (LASIX) 40 MG tablet Take 1 tablet (40 mg total) by mouth daily. 30 tablet 5  . isosorbide mononitrate (IMDUR) 30 MG 24 hr tablet Take 0.5 tablets (15 mg total) by mouth daily. 30 tablet 0  . mometasone-formoterol (DULERA) 100-5 MCG/ACT AERO Inhale 2 puffs into the lungs 2 (two) times daily. 1 Inhaler 0  . nitroGLYCERIN (NITROSTAT) 0.4 MG SL tablet Place 0.4 mg under the tongue every 5 (five) minutes as needed for chest pain.    . predniSONE (DELTASONE) 10 MG tablet Take 2 tablets (20 mg total) by mouth 2 (two) times daily. 20 tablet 0  . tiotropium (SPIRIVA) 18 MCG inhalation capsule Place 1 capsule (18 mcg total) into inhaler and inhale daily. 30 capsule 12   No current facility-administered medications for this visit.    Allergies:   Review of patient's allergies indicates no known allergies.    Social History:  The patient  reports that he has been smoking Cigarettes.  He has a 64 pack-year smoking history. He has never used smokeless tobacco. He reports that he drinks about 3.6 oz of alcohol per week. He reports that he does not use illicit drugs.   Family History:  The patient's family history includes Cancer in his mother; Heart disease in his father; Tuberculosis in his mother.    ROS:  Please see the history of present illness.   Otherwise, review of systems are positive for cough. No phlegm production. Erectile dysfunction and requesting Viagra..   All other systems are reviewed and negative.    PHYSICAL EXAM: VS:  BP 142/76 mmHg  Pulse 76  Ht  (1.854 m)  Wt 200 lb (90.719 kg)  BMI 26.39 kg/m2  SpO2 97% , BMI Body mass index is 26.39  kg/(m^2). GEN: Well nourished, well developed, in no acute distress HEENT: normal Neck: no JVD, carotid bruits, or masses Cardiac: RRR; no murmurs, rubs, or gallops,no edema  Respiratory: Diffuse rhonchi. No wheezes.  GI: soft, nontender, nondistended, + BS MS: no deformity or atrophy Skin: warm and dry, no rash Neuro:  Strength and sensation are intact Psych: euthymic mood, full affect   EKG:  EKG is not ordered today. The ekg ordered today demonstrates    Recent Labs: 04/21/2014: Pro B Natriuretic peptide (BNP) 13092.0*; TSH 0.478 06/14/2014: ALT 38 06/15/2014: Hemoglobin 13.1; Platelets 182 06/16/2014: Magnesium 2.0 06/18/2014: BUN  50*; Creatinine 1.77*; Potassium 4.7; Sodium 140    Lipid Panel    Component Value Date/Time   CHOL 140 04/04/2012 0555   TRIG 183* 04/04/2012 0555   HDL 25* 04/04/2012 0555   CHOLHDL 5.6 04/04/2012 0555   VLDL 37 04/04/2012 0555   LDLCALC 78 04/04/2012 0555      Wt Readings from Last 3 Encounters:  06/20/14 200 lb (90.719 kg)  06/18/14 187 lb 12.8 oz (85.186 kg)  06/06/14 183 lb (83.008 kg)      Other studies Reviewed: Additional studies/ records that were reviewed today include: Review of the discharge summary and hospital notes from the February 6 hospitalization.. Review of the above records demonstrates: Creatinine of 1.88 and initiation of low-dose isosorbide mononitrate.   ASSESSMENT AND PLAN:  1.  Coronary artery disease with prior bypass grafting and recent angina. No recurrence on isosorbide mononitrate, low dose. 2. ED with the patient disqualified from PDE 5 inhibitor therapy because of long-acting nitrate use. 3. Severe COPD, with cough but no active wheezing. 4. Chronic kidney disease, stage III. 5. Chronic combined systolic and diastolic heart failure with volume overload. This is due to discontinuation of diuretic therapy while hospitalized. Pleura known and furosemide at pre-existing doses.    Current medicines are  reviewed at length with the patient today.  The patient has concerns regarding medicines.  The following changes have been made:  The patient was to be on PDE 5 inhibitor therapy. I explained the reason for being in eligible. Diuretic therapy is resumed. He has significant lower extremity swelling today that should improve. I explained to him of the edema does not improve within 24 hours to call us at which point we may have to intensify therapy. I will otherwise plan to see the patient back in about 6 weeks with a basic metabolic panel.  Labs/ tests ordered today include: Basic metabolic panel in 6 weeks  No orders of the defined types were placed in this encounter.     Disposition:   FU with Mendel Ryder in 6 weeks   Signed, Lesleigh Noe, MD  06/20/2014 10:18 AM    [facility] [address] [place], [place], [place]  [address] Phone: [phone]; Fax: [phone]

## 2014-06-20 NOTE — Patient Instructions (Addendum)
Your physician has recommended you make the following change in your medication:  1) RESTART INSPRA 12.5mg  daily. An Rx has been sent to your  2) RESTART LASIX 40mg  daily. An Rx has been sent to your pharmacy    You have a follow up appointment scheduled on 08/01/14 @ 10:45am

## 2014-06-23 ENCOUNTER — Inpatient Hospital Stay (HOSPITAL_COMMUNITY)
Admission: EM | Admit: 2014-06-23 | Discharge: 2014-07-10 | DRG: 535 | Disposition: A | Discharge status: Skilled Nursing Facility | Payer: Medicare PPO | Attending: Internal Medicine | Admitting: Internal Medicine

## 2014-06-23 ENCOUNTER — Emergency Department (HOSPITAL_COMMUNITY): Payer: Medicare PPO

## 2014-06-23 ENCOUNTER — Observation Stay (HOSPITAL_COMMUNITY): Payer: Medicare PPO

## 2014-06-23 ENCOUNTER — Encounter (HOSPITAL_COMMUNITY): Payer: Self-pay | Admitting: *Deleted

## 2014-06-23 DIAGNOSIS — I071 Rheumatic tricuspid insufficiency: Secondary | ICD-10-CM | POA: Diagnosis present

## 2014-06-23 DIAGNOSIS — I739 Peripheral vascular disease, unspecified: Secondary | ICD-10-CM | POA: Diagnosis present

## 2014-06-23 DIAGNOSIS — J9620 Acute and chronic respiratory failure, unspecified whether with hypoxia or hypercapnia: Secondary | ICD-10-CM | POA: Diagnosis present

## 2014-06-23 DIAGNOSIS — R6520 Severe sepsis without septic shock: Secondary | ICD-10-CM | POA: Diagnosis not present

## 2014-06-23 DIAGNOSIS — E785 Hyperlipidemia, unspecified: Secondary | ICD-10-CM | POA: Diagnosis present

## 2014-06-23 DIAGNOSIS — Z9119 Patient's noncompliance with other medical treatment and regimen: Secondary | ICD-10-CM | POA: Diagnosis present

## 2014-06-23 DIAGNOSIS — R0600 Dyspnea, unspecified: Secondary | ICD-10-CM | POA: Insufficient documentation

## 2014-06-23 DIAGNOSIS — Z515 Encounter for palliative care: Secondary | ICD-10-CM | POA: Insufficient documentation

## 2014-06-23 DIAGNOSIS — F17200 Nicotine dependence, unspecified, uncomplicated: Secondary | ICD-10-CM | POA: Diagnosis present

## 2014-06-23 DIAGNOSIS — I130 Hypertensive heart and chronic kidney disease with heart failure and stage 1 through stage 4 chronic kidney disease, or unspecified chronic kidney disease: Secondary | ICD-10-CM | POA: Diagnosis present

## 2014-06-23 DIAGNOSIS — S32810A Multiple fractures of pelvis with stable disruption of pelvic ring, initial encounter for closed fracture: Secondary | ICD-10-CM | POA: Diagnosis not present

## 2014-06-23 DIAGNOSIS — S3210XA Unspecified fracture of sacrum, initial encounter for closed fracture: Secondary | ICD-10-CM | POA: Diagnosis present

## 2014-06-23 DIAGNOSIS — W19XXXA Unspecified fall, initial encounter: Secondary | ICD-10-CM | POA: Diagnosis present

## 2014-06-23 DIAGNOSIS — Z955 Presence of coronary angioplasty implant and graft: Secondary | ICD-10-CM

## 2014-06-23 DIAGNOSIS — M25559 Pain in unspecified hip: Secondary | ICD-10-CM | POA: Diagnosis present

## 2014-06-23 DIAGNOSIS — I509 Heart failure, unspecified: Secondary | ICD-10-CM

## 2014-06-23 DIAGNOSIS — G9340 Encephalopathy, unspecified: Secondary | ICD-10-CM | POA: Diagnosis present

## 2014-06-23 DIAGNOSIS — Z66 Do not resuscitate: Secondary | ICD-10-CM

## 2014-06-23 DIAGNOSIS — I10 Essential (primary) hypertension: Secondary | ICD-10-CM | POA: Diagnosis present

## 2014-06-23 DIAGNOSIS — R0603 Acute respiratory distress: Secondary | ICD-10-CM | POA: Diagnosis present

## 2014-06-23 DIAGNOSIS — I252 Old myocardial infarction: Secondary | ICD-10-CM

## 2014-06-23 DIAGNOSIS — E870 Hyperosmolality and hypernatremia: Secondary | ICD-10-CM | POA: Diagnosis not present

## 2014-06-23 DIAGNOSIS — N183 Chronic kidney disease, stage 3 unspecified: Secondary | ICD-10-CM | POA: Diagnosis present

## 2014-06-23 DIAGNOSIS — R140 Abdominal distension (gaseous): Secondary | ICD-10-CM

## 2014-06-23 DIAGNOSIS — N179 Acute kidney failure, unspecified: Secondary | ICD-10-CM | POA: Diagnosis not present

## 2014-06-23 DIAGNOSIS — D649 Anemia, unspecified: Secondary | ICD-10-CM | POA: Diagnosis present

## 2014-06-23 DIAGNOSIS — E43 Unspecified severe protein-calorie malnutrition: Secondary | ICD-10-CM | POA: Insufficient documentation

## 2014-06-23 DIAGNOSIS — N390 Urinary tract infection, site not specified: Secondary | ICD-10-CM | POA: Diagnosis not present

## 2014-06-23 DIAGNOSIS — R0602 Shortness of breath: Secondary | ICD-10-CM

## 2014-06-23 DIAGNOSIS — I2699 Other pulmonary embolism without acute cor pulmonale: Secondary | ICD-10-CM | POA: Diagnosis present

## 2014-06-23 DIAGNOSIS — M25551 Pain in right hip: Secondary | ICD-10-CM | POA: Diagnosis not present

## 2014-06-23 DIAGNOSIS — N529 Male erectile dysfunction, unspecified: Secondary | ICD-10-CM | POA: Diagnosis present

## 2014-06-23 DIAGNOSIS — S01319A Laceration without foreign body of unspecified ear, initial encounter: Secondary | ICD-10-CM | POA: Diagnosis present

## 2014-06-23 DIAGNOSIS — Z8249 Family history of ischemic heart disease and other diseases of the circulatory system: Secondary | ICD-10-CM

## 2014-06-23 DIAGNOSIS — F17210 Nicotine dependence, cigarettes, uncomplicated: Secondary | ICD-10-CM | POA: Diagnosis present

## 2014-06-23 DIAGNOSIS — Y95 Nosocomial condition: Secondary | ICD-10-CM | POA: Diagnosis present

## 2014-06-23 DIAGNOSIS — L89159 Pressure ulcer of sacral region, unspecified stage: Secondary | ICD-10-CM | POA: Diagnosis present

## 2014-06-23 DIAGNOSIS — K567 Ileus, unspecified: Secondary | ICD-10-CM | POA: Clinically undetermined

## 2014-06-23 DIAGNOSIS — J449 Chronic obstructive pulmonary disease, unspecified: Secondary | ICD-10-CM | POA: Diagnosis present

## 2014-06-23 DIAGNOSIS — Z7982 Long term (current) use of aspirin: Secondary | ICD-10-CM

## 2014-06-23 DIAGNOSIS — R531 Weakness: Secondary | ICD-10-CM

## 2014-06-23 DIAGNOSIS — I5043 Acute on chronic combined systolic (congestive) and diastolic (congestive) heart failure: Secondary | ICD-10-CM | POA: Diagnosis present

## 2014-06-23 DIAGNOSIS — R410 Disorientation, unspecified: Secondary | ICD-10-CM | POA: Insufficient documentation

## 2014-06-23 DIAGNOSIS — I5042 Chronic combined systolic (congestive) and diastolic (congestive) heart failure: Secondary | ICD-10-CM | POA: Diagnosis present

## 2014-06-23 DIAGNOSIS — Z8673 Personal history of transient ischemic attack (TIA), and cerebral infarction without residual deficits: Secondary | ICD-10-CM

## 2014-06-23 DIAGNOSIS — N186 End stage renal disease: Secondary | ICD-10-CM | POA: Diagnosis present

## 2014-06-23 DIAGNOSIS — J9621 Acute and chronic respiratory failure with hypoxia: Secondary | ICD-10-CM | POA: Diagnosis present

## 2014-06-23 DIAGNOSIS — Z79899 Other long term (current) drug therapy: Secondary | ICD-10-CM

## 2014-06-23 DIAGNOSIS — Z981 Arthrodesis status: Secondary | ICD-10-CM

## 2014-06-23 DIAGNOSIS — S32110A Nondisplaced Zone I fracture of sacrum, initial encounter for closed fracture: Secondary | ICD-10-CM | POA: Diagnosis present

## 2014-06-23 DIAGNOSIS — K219 Gastro-esophageal reflux disease without esophagitis: Secondary | ICD-10-CM | POA: Diagnosis present

## 2014-06-23 DIAGNOSIS — Z7952 Long term (current) use of systemic steroids: Secondary | ICD-10-CM

## 2014-06-23 DIAGNOSIS — K56609 Unspecified intestinal obstruction, unspecified as to partial versus complete obstruction: Secondary | ICD-10-CM

## 2014-06-23 DIAGNOSIS — I119 Hypertensive heart disease without heart failure: Secondary | ICD-10-CM | POA: Diagnosis present

## 2014-06-23 DIAGNOSIS — D638 Anemia in other chronic diseases classified elsewhere: Secondary | ICD-10-CM | POA: Diagnosis present

## 2014-06-23 DIAGNOSIS — Z6822 Body mass index (BMI) 22.0-22.9, adult: Secondary | ICD-10-CM

## 2014-06-23 DIAGNOSIS — Z809 Family history of malignant neoplasm, unspecified: Secondary | ICD-10-CM

## 2014-06-23 DIAGNOSIS — T501X5A Adverse effect of loop [high-ceiling] diuretics, initial encounter: Secondary | ICD-10-CM | POA: Diagnosis not present

## 2014-06-23 DIAGNOSIS — Z951 Presence of aortocoronary bypass graft: Secondary | ICD-10-CM

## 2014-06-23 DIAGNOSIS — I5023 Acute on chronic systolic (congestive) heart failure: Secondary | ICD-10-CM | POA: Clinically undetermined

## 2014-06-23 DIAGNOSIS — I480 Paroxysmal atrial fibrillation: Secondary | ICD-10-CM | POA: Diagnosis present

## 2014-06-23 DIAGNOSIS — M109 Gout, unspecified: Secondary | ICD-10-CM | POA: Diagnosis present

## 2014-06-23 DIAGNOSIS — D696 Thrombocytopenia, unspecified: Secondary | ICD-10-CM | POA: Diagnosis not present

## 2014-06-23 DIAGNOSIS — R52 Pain, unspecified: Secondary | ICD-10-CM

## 2014-06-23 DIAGNOSIS — I4892 Unspecified atrial flutter: Secondary | ICD-10-CM | POA: Diagnosis present

## 2014-06-23 DIAGNOSIS — Z720 Tobacco use: Secondary | ICD-10-CM

## 2014-06-23 DIAGNOSIS — I2510 Atherosclerotic heart disease of native coronary artery without angina pectoris: Secondary | ICD-10-CM | POA: Diagnosis present

## 2014-06-23 DIAGNOSIS — A4151 Sepsis due to Escherichia coli [E. coli]: Secondary | ICD-10-CM | POA: Diagnosis not present

## 2014-06-23 DIAGNOSIS — R627 Adult failure to thrive: Secondary | ICD-10-CM | POA: Diagnosis present

## 2014-06-23 DIAGNOSIS — S01311A Laceration without foreign body of right ear, initial encounter: Secondary | ICD-10-CM | POA: Diagnosis present

## 2014-06-23 DIAGNOSIS — S73024A Obturator dislocation of right hip, initial encounter: Secondary | ICD-10-CM | POA: Diagnosis present

## 2014-06-23 DIAGNOSIS — I340 Nonrheumatic mitral (valve) insufficiency: Secondary | ICD-10-CM | POA: Diagnosis present

## 2014-06-23 DIAGNOSIS — J189 Pneumonia, unspecified organism: Secondary | ICD-10-CM | POA: Diagnosis present

## 2014-06-23 LAB — URINALYSIS, ROUTINE W REFLEX MICROSCOPIC
Bilirubin Urine: NEGATIVE
Glucose, UA: NEGATIVE mg/dL
Hgb urine dipstick: NEGATIVE
KETONES UR: NEGATIVE mg/dL
Leukocytes, UA: NEGATIVE
Nitrite: NEGATIVE
Protein, ur: NEGATIVE mg/dL
Specific Gravity, Urine: 1.016 (ref 1.005–1.030)
Urobilinogen, UA: 0.2 mg/dL (ref 0.0–1.0)
pH: 5.5 (ref 5.0–8.0)

## 2014-06-23 LAB — CBC WITH DIFFERENTIAL/PLATELET
BASOS ABS: 0 10*3/uL (ref 0.0–0.1)
Basophils Relative: 0 % (ref 0–1)
EOS ABS: 0.1 10*3/uL (ref 0.0–0.7)
EOS PCT: 0 % (ref 0–5)
HEMATOCRIT: 41.3 % (ref 39.0–52.0)
Hemoglobin: 12.4 g/dL — ABNORMAL LOW (ref 13.0–17.0)
LYMPHS PCT: 6 % — AB (ref 12–46)
Lymphs Abs: 0.6 10*3/uL — ABNORMAL LOW (ref 0.7–4.0)
MCH: 25.6 pg — ABNORMAL LOW (ref 26.0–34.0)
MCHC: 30 g/dL (ref 30.0–36.0)
MCV: 85.3 fL (ref 78.0–100.0)
Monocytes Absolute: 0.6 10*3/uL (ref 0.1–1.0)
Monocytes Relative: 6 % (ref 3–12)
Neutro Abs: 9.9 10*3/uL — ABNORMAL HIGH (ref 1.7–7.7)
Neutrophils Relative %: 88 % — ABNORMAL HIGH (ref 43–77)
PLATELETS: 142 10*3/uL — AB (ref 150–400)
RBC: 4.84 MIL/uL (ref 4.22–5.81)
RDW: 17.2 % — ABNORMAL HIGH (ref 11.5–15.5)
WBC: 11.2 10*3/uL — AB (ref 4.0–10.5)

## 2014-06-23 LAB — COMPREHENSIVE METABOLIC PANEL
ALT: 76 U/L — ABNORMAL HIGH (ref 0–53)
AST: 28 U/L (ref 0–37)
Albumin: 3 g/dL — ABNORMAL LOW (ref 3.5–5.2)
Alkaline Phosphatase: 53 U/L (ref 39–117)
Anion gap: 7 (ref 5–15)
BILIRUBIN TOTAL: 0.7 mg/dL (ref 0.3–1.2)
BUN: 35 mg/dL — AB (ref 6–23)
CALCIUM: 8.3 mg/dL — AB (ref 8.4–10.5)
CO2: 30 mmol/L (ref 19–32)
CREATININE: 1.61 mg/dL — AB (ref 0.50–1.35)
Chloride: 105 mmol/L (ref 96–112)
GFR calc Af Amer: 47 mL/min — ABNORMAL LOW (ref >90)
GFR, EST NON AFRICAN AMERICAN: 40 mL/min — AB (ref >90)
GLUCOSE: 108 mg/dL — AB (ref 70–99)
Potassium: 5 mmol/L (ref 3.5–5.1)
SODIUM: 142 mmol/L (ref 135–145)
TOTAL PROTEIN: 5.5 g/dL — AB (ref 6.0–8.3)

## 2014-06-23 MED ORDER — ASPIRIN 81 MG PO CHEW
81.0000 mg | CHEWABLE_TABLET | Freq: Every day | ORAL | Status: DC
  Administered 2014-06-24 – 2014-07-09 (×15): 81 mg via ORAL
  Filled 2014-06-23 (×16): qty 1

## 2014-06-23 MED ORDER — ACETAMINOPHEN 650 MG RE SUPP: 650.0000 mg | Freq: Four times a day (QID) | RECTAL | Status: DC | PRN

## 2014-06-23 MED ORDER — SODIUM CHLORIDE 0.9 % IJ SOLN
3.0000 mL | Freq: Two times a day (BID) | INTRAMUSCULAR | Status: DC
  Administered 2014-06-24 – 2014-07-10 (×16): 3 mL via INTRAVENOUS

## 2014-06-23 MED ORDER — OXYCODONE HCL 5 MG PO TABS
5.0000 mg | ORAL_TABLET | ORAL | Status: DC | PRN
  Administered 2014-06-23 – 2014-07-01 (×20): 5 mg via ORAL
  Filled 2014-06-23 (×20): qty 1

## 2014-06-23 MED ORDER — FENTANYL CITRATE 0.05 MG/ML IJ SOLN
50.0000 ug | Freq: Once | INTRAMUSCULAR | Status: AC
  Administered 2014-06-23: 50 ug via INTRAVENOUS
  Filled 2014-06-23: qty 2

## 2014-06-23 MED ORDER — HYDROGEN PEROXIDE 3 % EX SOLN
CUTANEOUS | Status: AC
  Administered 2014-06-23: 17:00:00
  Filled 2014-06-23: qty 473

## 2014-06-23 MED ORDER — MORPHINE SULFATE 2 MG/ML IJ SOLN
2.0000 mg | INTRAMUSCULAR | Status: DC | PRN
  Administered 2014-06-25 – 2014-06-30 (×19): 2 mg via INTRAVENOUS
  Filled 2014-06-23 (×19): qty 1

## 2014-06-23 MED ORDER — FUROSEMIDE 40 MG PO TABS: 40.0000 mg | ORAL_TABLET | Freq: Every day | ORAL | Status: DC

## 2014-06-23 MED ORDER — NICOTINE 21 MG/24HR TD PT24
21.0000 mg | MEDICATED_PATCH | Freq: Every day | TRANSDERMAL | Status: DC
  Administered 2014-06-24 – 2014-07-10 (×17): 21 mg via TRANSDERMAL
  Filled 2014-06-23 (×18): qty 1

## 2014-06-23 MED ORDER — ATORVASTATIN CALCIUM 10 MG PO TABS
10.0000 mg | ORAL_TABLET | Freq: Every day | ORAL | Status: DC
  Administered 2014-06-23 – 2014-06-28 (×5): 10 mg via ORAL
  Filled 2014-06-23 (×6): qty 1

## 2014-06-23 MED ORDER — ENOXAPARIN SODIUM 40 MG/0.4ML ~~LOC~~ SOLN
40.0000 mg | SUBCUTANEOUS | Status: DC
  Administered 2014-06-23 – 2014-06-24 (×2): 40 mg via SUBCUTANEOUS
  Filled 2014-06-23 (×3): qty 0.4

## 2014-06-23 MED ORDER — NITROGLYCERIN 0.4 MG SL SUBL: 0.4000 mg | SUBLINGUAL_TABLET | SUBLINGUAL | Status: DC | PRN

## 2014-06-23 MED ORDER — PANTOPRAZOLE SODIUM 40 MG PO TBEC
40.0000 mg | DELAYED_RELEASE_TABLET | Freq: Every day | ORAL | Status: DC
  Administered 2014-06-24 – 2014-06-30 (×7): 40 mg via ORAL
  Filled 2014-06-23 (×9): qty 1

## 2014-06-23 MED ORDER — SODIUM CHLORIDE 0.9 % IJ SOLN
3.0000 mL | INTRAMUSCULAR | Status: DC | PRN
  Administered 2014-06-23 – 2014-07-01 (×2): 3 mL via INTRAVENOUS
  Filled 2014-06-23 (×2): qty 3

## 2014-06-23 MED ORDER — ACETAMINOPHEN 325 MG PO TABS
650.0000 mg | ORAL_TABLET | Freq: Four times a day (QID) | ORAL | Status: DC | PRN
  Administered 2014-06-25: 650 mg via ORAL
  Filled 2014-06-23: qty 2

## 2014-06-23 MED ORDER — SENNOSIDES-DOCUSATE SODIUM 8.6-50 MG PO TABS: 1.0000 | ORAL_TABLET | Freq: Every evening | ORAL | Status: DC | PRN

## 2014-06-23 MED ORDER — SODIUM CHLORIDE 0.9 % IV SOLN: 250.0000 mL | INTRAVENOUS | Status: DC | PRN

## 2014-06-23 MED ORDER — ALUM & MAG HYDROXIDE-SIMETH 200-200-20 MG/5ML PO SUSP: 30.0000 mL | Freq: Four times a day (QID) | ORAL | Status: DC | PRN

## 2014-06-23 MED ORDER — BUPROPION HCL ER (XL) 300 MG PO TB24
300.0000 mg | ORAL_TABLET | Freq: Every day | ORAL | Status: DC
  Administered 2014-06-24 – 2014-06-30 (×7): 300 mg via ORAL
  Filled 2014-06-23 (×10): qty 1

## 2014-06-23 MED ORDER — FUROSEMIDE 10 MG/ML IJ SOLN
40.0000 mg | Freq: Two times a day (BID) | INTRAMUSCULAR | Status: DC
  Administered 2014-06-23: 40 mg via INTRAVENOUS
  Filled 2014-06-23 (×2): qty 4

## 2014-06-23 MED ORDER — ONDANSETRON HCL 4 MG PO TABS: 4.0000 mg | ORAL_TABLET | Freq: Four times a day (QID) | ORAL | Status: DC | PRN

## 2014-06-23 MED ORDER — CARVEDILOL 12.5 MG PO TABS
12.5000 mg | ORAL_TABLET | Freq: Two times a day (BID) | ORAL | Status: DC
  Administered 2014-06-23 – 2014-07-01 (×15): 12.5 mg via ORAL
  Filled 2014-06-23 (×17): qty 1

## 2014-06-23 MED ORDER — DOCUSATE SODIUM 100 MG PO CAPS
100.0000 mg | ORAL_CAPSULE | Freq: Two times a day (BID) | ORAL | Status: DC
  Administered 2014-06-23 – 2014-06-30 (×14): 100 mg via ORAL
  Filled 2014-06-23 (×13): qty 1

## 2014-06-23 MED ORDER — PREDNISONE 20 MG PO TABS: 20.0000 mg | ORAL_TABLET | Freq: Two times a day (BID) | ORAL | Status: DC

## 2014-06-23 MED ORDER — ALBUTEROL SULFATE (2.5 MG/3ML) 0.083% IN NEBU
2.5000 mg | INHALATION_SOLUTION | RESPIRATORY_TRACT | Status: DC | PRN
  Administered 2014-06-24: 2.5 mg via RESPIRATORY_TRACT
  Filled 2014-06-23: qty 3

## 2014-06-23 MED ORDER — TIOTROPIUM BROMIDE MONOHYDRATE 18 MCG IN CAPS
18.0000 ug | ORAL_CAPSULE | Freq: Every day | RESPIRATORY_TRACT | Status: DC
  Administered 2014-06-24 – 2014-06-25 (×2): 18 ug via RESPIRATORY_TRACT
  Filled 2014-06-23: qty 5

## 2014-06-23 MED ORDER — SORBITOL 70 % SOLN
30.0000 mL | Freq: Every day | Status: DC | PRN
  Administered 2014-06-30: 30 mL via ORAL
  Filled 2014-06-23 (×3): qty 30

## 2014-06-23 MED ORDER — MOMETASONE FURO-FORMOTEROL FUM 100-5 MCG/ACT IN AERO
2.0000 | INHALATION_SPRAY | Freq: Two times a day (BID) | RESPIRATORY_TRACT | Status: DC
  Administered 2014-06-23 – 2014-07-01 (×13): 2 via RESPIRATORY_TRACT
  Filled 2014-06-23: qty 8.8

## 2014-06-23 MED ORDER — ISOSORBIDE MONONITRATE 15 MG HALF TABLET
15.0000 mg | ORAL_TABLET | Freq: Every day | ORAL | Status: DC
  Administered 2014-06-24 – 2014-06-30 (×7): 15 mg via ORAL
  Filled 2014-06-23 (×7): qty 1

## 2014-06-23 MED ORDER — ONDANSETRON HCL 4 MG/2ML IJ SOLN
4.0000 mg | Freq: Four times a day (QID) | INTRAMUSCULAR | Status: DC | PRN
  Administered 2014-06-27 – 2014-07-01 (×2): 4 mg via INTRAVENOUS
  Filled 2014-06-23 (×2): qty 2

## 2014-06-23 MED ORDER — ACETAMINOPHEN 325 MG PO TABS
650.0000 mg | ORAL_TABLET | Freq: Once | ORAL | Status: AC
  Administered 2014-06-23: 650 mg via ORAL
  Filled 2014-06-23: qty 2

## 2014-06-23 MED ORDER — COLCHICINE 0.6 MG PO TABS
0.6000 mg | ORAL_TABLET | Freq: Every day | ORAL | Status: DC
  Administered 2014-06-24 – 2014-06-30 (×7): 0.6 mg via ORAL
  Filled 2014-06-23 (×7): qty 1

## 2014-06-23 MED ORDER — IPRATROPIUM BROMIDE 0.02 % IN SOLN
0.5000 mg | RESPIRATORY_TRACT | Status: DC | PRN
  Administered 2014-07-01: 0.5 mg via RESPIRATORY_TRACT
  Filled 2014-06-23: qty 2.5

## 2014-06-23 MED ORDER — SPIRONOLACTONE 25 MG PO TABS
25.0000 mg | ORAL_TABLET | Freq: Every day | ORAL | Status: DC
  Administered 2014-06-24 – 2014-06-30 (×7): 25 mg via ORAL
  Filled 2014-06-23 (×7): qty 1

## 2014-06-23 NOTE — H&P (Signed)
[facility] History and Physical  Nathan KellMaurice W Montagna UUV:[id number] DOB: 08/10/1940 DOA: 06/23/2014  Referring physician: Dr. Romeo AppleHarrison PCP: Lillia MountainGRIFFIN,JOHN JOSEPH, MD   Chief Complaint: Fall  HPI: Nathan [name] is a 74 y.o. male  With history of extensive CAD, COPD with ongoing tobacco abuse, chronic CHF, HTN, peripheral vascular disease, gastroesophageal reflux disease, chronic kidney disease stage III, patient was recently hospitalized from 06/14/2014 2 06/18/2014 for an acute COPD exacerbation. Patient states he's been doing well rest 30 wise since discharge. Patient is presenting to the ED after a fall. Patient states that woke up around 3 AM on the day of admission to go to the bathroom when he tried to put weight on his right foot his right leg gave away and he subsequently fell on his right side hitting his head and his elbow. Patient was in excruciating pain in his right hip subsequently called 911 who came and assessed the patient and were able to place the patient back in bed around 4 AM on the day of admission. Patient states woke up around 7 AM on the day of admission spoke to one of his tenants was the EMS worker who came by and examined the patient and recommended the patient called 911 and present to the ED. Patient presented to the ED pain films of the right hip which were done were negative for any fracture or dislocation. Urinalysis was negative. Comprehensive metabolic profile had a BUN of 35 creatinine of 1.61 albumin of 3.0 ALT of 76 protein of 5.5 otherwise was within normal limits. CBC done at a white count of 11.2 hemoglobin of 12.4 otherwise was within normal limits. CT of the head which was done was negative for any acute abnormalities. ED physician try to ambulate the patient however she was unable to bear weight on that right leg and the hospitalist was subsequently called to admit the patient for further evaluation and management.   Review of Systems: As per  history of present illness otherwise negative. Constitutional:  No weight loss, night sweats, Fevers, chills, fatigue.  HEENT:  No headaches, Difficulty swallowing,Tooth/dental problems,Sore throat,  No sneezing, itching, ear ache, nasal congestion, post nasal drip,  Cardio-vascular:  No chest pain, Orthopnea, PND, swelling in lower extremities, anasarca, dizziness, palpitations  GI:  No heartburn, indigestion, abdominal pain, nausea, vomiting, diarrhea, change in bowel habits, loss of appetite  Resp:  No shortness of breath with exertion or at rest. No excess mucus, no productive cough, No non-productive cough, No coughing up of blood.No change in color of mucus.No wheezing.No chest wall deformity  Skin:  no rash or lesions.  GU:  no dysuria, change in color of urine, no urgency or frequency. No flank pain.  Musculoskeletal:  No joint pain or swelling. No decreased range of motion. No back pain.  Psych:  No change in mood or affect. No depression or anxiety. No memory loss.   Past Medical History  Diagnosis Date  . Aorto-iliac disease     a. s/p bilat with stent 2007 - followed by [facility].  . CAD (coronary artery disease)     a. 10/2011 s/p CABG x4 (LIMA-LAD, SVG-Diag, SVG-OM1 and SVG-PDA)  b. 03/2012 NSTEMI s/p LHC SVG-RCA and LIMA-LAD but total occlusion of SVG-OM as well as SVG-diag. RX therapy recommended   . Hypertension   . ED (erectile dysfunction)   . COPD, severe   . Hyperlipidemia   . Stroke   . GERD (gastroesophageal reflux disease)   . Gout   .  Paroxysmal atrial flutter     ablation  . Chronic combined systolic and diastolic CHF (congestive heart failure)     a. Last echo 04/2014: EF 40-45%.  Marland Kitchen PAF (paroxysmal atrial fibrillation)   . CKD (chronic kidney disease), stage III   . Tobacco abuse   . History of noncompliance with medical treatment   . Carotid artery disease     a. RCEA 2013 - followed by [facility].  . Anemia   . Lower GI bleed     a. LGIB 10/2012:  Colonoscopy performed June 21 showed blood in the colon but no definite source identified, diverticular versus AVM suspected. Aspirin stopped.  . Mitral regurgitation   . Hypertensive heart disease    Past Surgical History  Procedure Laterality Date  . Spinal fusion    . Hand surgery    . Neck fusion  1975  . Eye surgery    . Coronary artery bypass graft  10/31/2011    Procedure: CORONARY ARTERY BYPASS GRAFTING (CABG);  Surgeon: Delight Ovens, MD;  Location: [facility] OR;  Service: Open Heart Surgery;  Laterality: N/A;  times three using  Left Internal Mammary Artery and Right Greater Saphenous Vein Graft harvested endoscopically; TEE  . Endarterectomy  10/31/2011    Procedure: ENDARTERECTOMY CAROTID;  Surgeon: Nada Libman, MD;  Location: [facility] OR;  Service: Vascular;  Laterality: Right;  with patch angioplasty   . Carotid endarterectomy  10-31-2011    right  . Colonoscopy N/A 10/30/2012    Procedure: COLONOSCOPY;  Surgeon: Shirley Friar, MD;  Location[facility] ENDOSCOPY;  Service: Endoscopy;  Laterality: N/A;  . Left heart catheterization with coronary angiogram N/A 09/04/2011    Procedure: LEFT HEART CATHETERIZATION WITH CORONARY ANGIOGRAM;  Surgeon: Lesleigh Noe, MD;  Location: [facility] CATH LAB;  Service: Cardiovascular;  Laterality: N/A;  . Left heart cath N/A 04/03/2012    Procedure: LEFT HEART CATH;  Surgeon: Lennette Bihari, MD;  Location: [facility] CATH LAB;  Service: Cardiovascular;  Laterality: N/A;   Social History:  reports that he has been smoking Cigarettes.  He has a 64 pack-year smoking history. He has never used smokeless tobacco. He reports that he drinks about 3.6 oz of alcohol per week. He reports that he does not use illicit drugs.  No Known Allergies  Family History  Problem Relation Age of Onset  . Cancer Mother   . Tuberculosis Mother   . Heart disease Father     He did NOT have it before age 60    Prior to Admission medications   Medication Sig Start Date End Date  Taking? Authorizing Provider  albuterol (PROVENTIL HFA;VENTOLIN HFA) 108 (90 BASE) MCG/ACT inhaler Inhale 1-2 puffs into the lungs every 6 (six) hours as needed for wheezing or shortness of breath. 06/18/14   Rhetta Mura, MD  albuterol (PROVENTIL) (2.5 MG/3ML) 0.083% nebulizer solution Take 3 mLs (2.5 mg total) by nebulization every 4 (four) hours as needed for wheezing or shortness of breath. 06/06/14   Geoffery Lyons, MD  aspirin 81 MG chewable tablet Chew 1 tablet (81 mg total) by mouth daily. 01/05/14   Jeralyn Bennett, MD  atorvastatin (LIPITOR) 10 MG tablet Take 1 tablet (10 mg total) by mouth daily at 6 PM. 01/05/14   Jeralyn Bennett, MD  buPROPion (WELLBUTRIN XL) 300 MG 24 hr tablet Take 300 mg by mouth daily.    Historical Provider, MD  carvedilol (COREG) 12.5 MG tablet Take 1 tablet (12.5 mg total) by mouth  2 (two) times daily with a meal. 06/18/14   Rhetta Mura, MD  colchicine 0.6 MG tablet Take 0.6 mg by mouth daily.    Historical Provider, MD  eplerenone (INSPRA) 25 MG tablet Take 0.5 tablets (12.5 mg total) by mouth daily. 06/20/14   Lyn Records III, MD  furosemide (LASIX) 40 MG tablet Take 1 tablet (40 mg total) by mouth daily. 06/20/14   Lyn Records III, MD  isosorbide mononitrate (IMDUR) 30 MG 24 hr tablet Take 0.5 tablets (15 mg total) by mouth daily. 06/18/14   Rhetta Mura, MD  mometasone-formoterol (DULERA) 100-5 MCG/ACT AERO Inhale 2 puffs into the lungs 2 (two) times daily. 06/18/14   Rhetta Mura, MD  nitroGLYCERIN (NITROSTAT) 0.4 MG SL tablet Place 0.4 mg under the tongue every 5 (five) minutes as needed for chest pain.    Historical Provider, MD  predniSONE (DELTASONE) 10 MG tablet Take 2 tablets (20 mg total) by mouth 2 (two) times daily. 06/18/14   Rhetta Mura, MD  tiotropium (SPIRIVA) 18 MCG inhalation capsule Place 1 capsule (18 mcg total) into inhaler and inhale daily. 06/18/14   Rhetta Mura, MD   Physical Exam: Filed Vitals:   06/23/14  1255 06/23/14 1533  BP: 130/63 120/69  Pulse: 77 75  Temp: 97.6 F (36.4 C) 97.6 F (36.4 C)  TempSrc: Oral Oral  Resp: 20 15  Height:  (1.778 m)   Weight: 84.369 kg (186 lb)   SpO2: 100% 98%    Wt Readings from Last 3 Encounters:  06/23/14 84.369 kg (186 lb)  06/20/14 90.719 kg (200 lb)  06/18/14 85.186 kg (187 lb 12.8 oz)    General:  Well-developed well-nourished in no acute cardiac pulmonary distress. Speaking in full sentences.  Eyes: PERRLA, EOMI, normal lids, irises & conjunctiva ENT: grossly normal hearing, lips & tongue Neck: no LAD, masses or thyromegaly Cardiovascular: RRR, no m/r/g. 2+ bilateral lower extremity edema. Telemetry: SR, no arrhythmias  Respiratory: CTA bilaterally, no w/r/r. Normal respiratory effort. Abdomen: soft, ntnd, positive bowel sounds, no rebound, no guarding. Bilateral lower abdomen with ecchymosis. Skin: Bilateral lower abdomen with ecchymosis. no rash or induration seen on limited exam Musculoskeletal: grossly normal tone BUE/BLE Psychiatric: grossly normal mood and affect, speech fluent and appropriate Neurologic: Alert and oriented 3. Cranial nerves II through XII are grossly intact. Sensation is intact. No focal deficits.           Labs on Admission:  Basic Metabolic Panel:  Recent Labs Lab 06/17/14 0516 06/18/14 0523 06/23/14 1413  NA 136 140 142  K 4.7 4.7 5.0  CL 104 105 105  CO2 GLUCOSE 109* 94 108*  BUN 51* 50* 35*  CREATININE 1.81* 1.77* 1.61*  CALCIUM 8.4 8.5 8.3*   Liver Function Tests:  Recent Labs Lab 06/23/14 1413  AST 28  ALT 76*  ALKPHOS 53  BILITOT 0.7  PROT 5.5*  ALBUMIN 3.0*   No results for input(s): LIPASE, AMYLASE in the last 168 hours. No results for input(s): AMMONIA in the last 168 hours. CBC:  Recent Labs Lab 06/23/14 1413  WBC 11.2*  NEUTROABS 9.9*  HGB 12.4*  HCT 41.3  MCV 85.3  PLT 142*   Cardiac Enzymes:  Recent Labs Lab 06/16/14 2330  TROPONINI 0.03     BNP (last 3 results) No results for input(s): BNP in the last 8760 hours.  ProBNP (last 3 results)  Recent Labs  01/04/14 0450 04/21/14 1504  PROBNP 3786.0* 13092.0*  CBG: No results for input(s): GLUCAP in the last 168 hours.  Radiological Exams on Admission: Ct Head Wo Contrast  06/23/2014   CLINICAL DATA:  Larey Seat about 8 hr ago now with injury to the right side of the head and ear.  EXAM: CT HEAD WITHOUT CONTRAST  TECHNIQUE: Contiguous axial images were obtained from the base of the skull through the vertex without intravenous contrast.  COMPARISON:  09/19/2011; 09/11/2011  FINDINGS: Similar findings of atrophy with diffuse sulcal prominence, centralized volume loss and mild commensurate expected dilatation of the ventricular system. Scattered periventricular hypodensities are unchanged compatible with microvascular ischemic disease. The gray-white differentiation is otherwise well maintained without CT evidence of acute large territory infarct. No intraparenchymal or extra-axial mass or hemorrhage. Unchanged size and configuration of the ventricles and basilar cisterns. No midline shift. Intracranial atherosclerosis. Limited visualization of the paranasal sinuses and mastoid air cells is normal. No air-fluid levels. Regional soft tissues appear normal. Post left-sided cataract surgery.  IMPRESSION: Atrophy and microvascular ischemic disease without acute intracranial process.   Electronically Signed   By: Simonne Come M.D.   On: 06/23/2014 15:01   Dg Hip Unilat  With Pelvis 2-3 Views Right  06/23/2014   CLINICAL DATA:  Status post fall today while getting out of bed; right hip pain  EXAM: RIGHT HIP (WITH PELVIS) 2-3 VIEWS  COMPARISON:  Supine abdominal film of January 04, 2014 which includes a portion of the hips  FINDINGS: The bony pelvis is osteopenic. No acute pelvic fracture is demonstrated. The hip joint spaces are preserved. The observed portions of the sacrum are normal. There are  common iliac stents bilaterally.  AP and frog-leg lateral views of the right hip reveal the joint space to be normal. The acetabulum is intact. The femoral head, neck, and intertrochanteric regions are normal in appearance.  IMPRESSION: There is no acute bony abnormality of the right hip.   Electronically Signed   By: David  [place]   On: 06/23/2014 14:05    EKG: Independently reviewed. Normal sinus rhythm.  Assessment/Plan Principal Problem:   Right hip pain Active Problems:   COPD (chronic obstructive pulmonary disease), gold stage C.   Hyperlipidemia   S/P CABG (coronary artery bypass graft)   Chronic kidney disease (CKD), stage III (moderate)   Tobacco use disorder   PVD (peripheral vascular disease)   Essential hypertension   PAF (paroxysmal atrial fibrillation)   CAD (coronary artery disease)   Chronic combined systolic and diastolic CHF (congestive heart failure)   Anemia   Hypertensive heart disease   Fall   Ear lobe laceration   Hip pain, acute  #1 right hip pain Secondary to mechanical fall. Patient unable to bear weight on the right lower extremity and unable to ambulate. Plain films of the right hip were negative for any fracture or dislocation. Admit the patient to MedSurg floor. We'll get a MRI of the right hip to rule out any will call fracture. PT/OT. Pain management. Supportive care.  #2 COPD gold stage C Stable. Continue home regimen of dulera, Spiriva. Nebs as needed.  #3 coronary artery disease status post CABG/chronic combined systolic and diastolic CHF Stable. Patient with no respiratory symptoms. Patient however with lower extremity edema. Will give Lasix 40 mg IV 2 doses and then resume home regimen of diuretics. Continue home regimen of Coreg, Aldactone, imdur, Lipitor.  #4 chronic kidney disease stage III Stable. Monitor closely with diuresis.  #4 tobacco abuse Tobacco cessation. Placed on a nicotine patch.  5 hypertension Stable. Continue home  regimen of Coreg,imdur, diuretics.  #6 Fall PT/OT.  #7 ear lobe laceration Status post repair PED physician.  #8 prophylaxis PPI for GI prophylaxis. Lovenox for DVT prophylaxis.  Code Status:Full DVT Prophylaxis:Lovenox Family Communication: updated patient and wife at bedside. Disposition Plan: Admit to medsurg.  Time spent: 65 mins  [facility] MD [facility] Pager [phone]

## 2014-06-23 NOTE — ED Notes (Signed)
Per EMS pt experienced a fall about 8 hours ago. Pt reports standing up and RLE giving out on him. He hit his head injuring right ear on door casing. He also c/o pain in RLE.  Pt was admitted last week  D/t COPD/PNA.  Pt A&O x 4.

## 2014-06-23 NOTE — ED Notes (Signed)
Bed: WA08 Expected date:  Expected time:  Means of arrival:  Comments: EMS-fall 

## 2014-06-23 NOTE — ED Provider Notes (Signed)
CSN: [id number]     Arrival date & time 06/23/14  1250 History   First MD Initiated Contact with Patient 06/23/14 1341     Chief Complaint  Patient presents with  . Fall    x 8 hrs ago     (Consider location/radiation/quality/duration/timing/severity/associated sxs/prior Treatment) Patient is a 74 y.o. male presenting with fall. The history is provided by the patient.  Fall This is a new problem. The current episode started 6 to 12 hours ago. Episode frequency: once. The problem has been resolved. Pertinent negatives include no chest pain, no abdominal pain, no headaches and no shortness of breath. Nothing aggravates the symptoms. Nothing relieves the symptoms. He has tried nothing for the symptoms. The treatment provided no relief.    Past Medical History  Diagnosis Date  . Aorto-iliac disease     a. s/p bilat with stent 2007 - followed by [facility].  . CAD (coronary artery disease)     a. 10/2011 s/p CABG x4 (LIMA-LAD, SVG-Diag, SVG-OM1 and SVG-PDA)  b. 03/2012 NSTEMI s/p LHC SVG-RCA and LIMA-LAD but total occlusion of SVG-OM as well as SVG-diag. RX therapy recommended   . Hypertension   . ED (erectile dysfunction)   . COPD, severe   . Hyperlipidemia   . Stroke   . GERD (gastroesophageal reflux disease)   . Gout   . Paroxysmal atrial flutter     ablation  . Chronic combined systolic and diastolic CHF (congestive heart failure)     a. Last echo 04/2014: EF 40-45%.  Marland Kitchen PAF (paroxysmal atrial fibrillation)   . CKD (chronic kidney disease), stage III   . Tobacco abuse   . History of noncompliance with medical treatment   . Carotid artery disease     a. RCEA 2013 - followed by [facility].  . Anemia   . Lower GI bleed     a. LGIB 10/2012: Colonoscopy performed June 21 showed blood in the colon but no definite source identified, diverticular versus AVM suspected. Aspirin stopped.  . Mitral regurgitation   . Hypertensive heart disease    Past Surgical History  Procedure Laterality Date  .  Spinal fusion    . Hand surgery    . Neck fusion  1975  . Eye surgery    . Coronary artery bypass graft  10/31/2011    Procedure: CORONARY ARTERY BYPASS GRAFTING (CABG);  Surgeon: Delight Ovens, MD;  Location: [facility] OR;  Service: Open Heart Surgery;  Laterality: N/A;  times three using  Left Internal Mammary Artery and Right Greater Saphenous Vein Graft harvested endoscopically; TEE  . Endarterectomy  10/31/2011    Procedure: ENDARTERECTOMY CAROTID;  Surgeon: Nada Libman, MD;  Location: [facility] OR;  Service: Vascular;  Laterality: Right;  with patch angioplasty   . Carotid endarterectomy  10-31-2011    right  . Colonoscopy N/A 10/30/2012    Procedure: COLONOSCOPY;  Surgeon: Shirley Friar, MD;  Location: [facility] ENDOSCOPY;  Service: Endoscopy;  Laterality: N/A;  . Left heart catheterization with coronary angiogram N/A 09/04/2011    Procedure: LEFT HEART CATHETERIZATION WITH CORONARY ANGIOGRAM;  Surgeon: Lesleigh Noe, MD;  Location: [facility] CATH LAB;  Service: Cardiovascular;  Laterality: N/A;  . Left heart cath N/A 04/03/2012    Procedure: LEFT HEART CATH;  Surgeon: Lennette Bihari, MD;  Location: [facility] CATH LAB;  Service: Cardiovascular;  Laterality: N/A;   Family History  Problem Relation Age of Onset  . Cancer Mother   . Tuberculosis Mother   .  Heart disease Father     He did NOT have it before age 74   History  Substance Use Topics  . Smoking status: Current Some Day Smoker -- 1.00 packs/day for 64 years    Types: Cigarettes  . Smokeless tobacco: Never Used  . 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  HENT: Negative for drooling and rhinorrhea.   Eyes: Negative for pain.  Respiratory: Negative for cough and shortness of breath.   Cardiovascular: Negative for chest pain and leg swelling.  Gastrointestinal: Negative  for nausea, vomiting, abdominal pain and diarrhea.  Genitourinary: Negative for dysuria and hematuria.  Musculoskeletal: Negative for gait problem and neck pain.  Skin: Negative for color change.  Neurological: Negative for numbness and headaches.  Hematological: Negative for adenopathy.  Psychiatric/Behavioral: Negative for behavioral problems.  All other systems reviewed and are negative.     Allergies  Review of patient's allergies indicates no known allergies.  Home Medications   Prior to Admission medications   Medication Sig Start Date End Date Taking? Authorizing Provider  albuterol (PROVENTIL HFA;VENTOLIN HFA) 108 (90 BASE) MCG/ACT inhaler Inhale 1-2 puffs into the lungs every 6 (six) hours as needed for wheezing or shortness of breath. 06/18/14   Rhetta MuraJai-Gurmukh Samtani, MD  albuterol (PROVENTIL) (2.5 MG/3ML) 0.083% nebulizer solution Take 3 mLs (2.5 mg total) by nebulization every 4 (four) hours as needed for wheezing or shortness of breath. 06/06/14   Geoffery Lyonsouglas Delo, MD  aspirin 81 MG chewable tablet Chew 1 tablet (81 mg total) by mouth daily. 01/05/14   Jeralyn BennettEzequiel Zamora, MD  atorvastatin (LIPITOR) 10 MG tablet Take 1 tablet (10 mg total) by mouth daily at 6 PM. 01/05/14   Jeralyn BennettEzequiel Zamora, MD  buPROPion (WELLBUTRIN XL) 300 MG 24 hr tablet Take 300 mg by mouth daily.    Historical Provider, MD  carvedilol (COREG) 12.5 MG tablet Take 1 tablet (12.5 mg total) by mouth 2 (two) times daily with a meal. 06/18/14   Rhetta MuraJai-Gurmukh Samtani, MD  colchicine 0.6 MG tablet Take 0.6 mg by mouth daily.    Historical Provider, MD  eplerenone (INSPRA) 25 MG tablet Take 0.5 tablets (12.5 mg total) by mouth daily. 06/20/14   Lyn RecordsHenry W Smith III, MD  furosemide (LASIX) 40 MG tablet Take 1 tablet (40 mg total) by mouth daily. 06/20/14   Lyn RecordsHenry W Smith III, MD  isosorbide mononitrate (IMDUR) 30 MG 24 hr tablet Take 0.5 tablets (15 mg total) by mouth daily. 06/18/14   Rhetta MuraJai-Gurmukh Samtani, MD  mometasone-formoterol (DULERA)  100-5 MCG/ACT AERO Inhale 2 puffs into the lungs 2 (two) times daily. 06/18/14   Rhetta MuraJai-Gurmukh Samtani, MD  nitroGLYCERIN (NITROSTAT) 0.4 MG SL tablet Place 0.4 mg under the tongue every 5 (five) minutes as needed for chest pain.    Historical Provider, MD  predniSONE (DELTASONE) 10 MG tablet Take 2 tablets (20 mg total) by mouth 2 (two) times daily. 06/18/14   Rhetta MuraJai-Gurmukh Samtani, MD  tiotropium (SPIRIVA) 18 MCG inhalation capsule Place 1 capsule (18 mcg total) into inhaler and inhale daily. 06/18/14   Rhetta MuraJai-Gurmukh Samtani, MD   BP 130/63 mmHg  Pulse 77  Temp(Src) 97.6 F (36.4 C) (Oral)  Resp 20  Ht 5\' 10"  (1.778 m)  Wt 186 lb (84.369 kg)  BMI 26.69  kg/m2  SpO2 100% Physical Exam  Constitutional: He is oriented to person, place, and time. He appears well-developed and well-nourished.  HENT:  Head: Normocephalic and atraumatic.  Right Ear: External ear normal.  Left Ear: External ear normal.  Nose: Nose normal.  Mouth/Throat: Oropharynx is clear and moist. No oropharyngeal exudate.  1 cm laceration to the right ear, upper helix.  Eyes: Conjunctivae and EOM are normal. Pupils are equal, round, and reactive to light.  Neck: Normal range of motion. Neck supple.  No vertebral tenderness noted.  Cardiovascular: Normal rate, regular rhythm, normal heart sounds and intact distal pulses.  Exam reveals no gallop and no friction rub.   No murmur heard. Pulmonary/Chest: Effort normal and breath sounds normal. No respiratory distress. He has no wheezes.  Abdominal: Soft. Bowel sounds are normal. He exhibits no distension. There is no tenderness. There is no rebound and no guarding.  Mild old appearing bruising on bilateral sides of the lower abdomen.  Musculoskeletal: Normal range of motion. He exhibits tenderness. He exhibits no edema.  Tenderness to palpation of the right lateral hip.  Neurological: He is alert and oriented to person, place, and time.  Skin: Skin is warm and dry.  Psychiatric: He  has a normal mood and affect. His behavior is normal.  Nursing note and vitals reviewed.   ED Course  LACERATION REPAIR Date/Time: 06/23/2014 4:08 PM Performed by: Purvis Sheffield Authorized by: Purvis Sheffield Consent: Verbal consent obtained. Written consent not obtained. Risks and benefits: risks, benefits and alternatives were discussed Consent given by: patient Patient understanding: patient states understanding of the procedure being performed Required items: required blood products, implants, devices, and special equipment available Patient identity confirmed: verbally with patient, arm band, provided demographic data and hospital-assigned identification number Time out: Immediately prior to procedure a "time out" was called to verify the correct patient, procedure, equipment, support staff and site/side marked as required. Location: helix of right ear. Laceration length: 1 cm Foreign bodies: no foreign bodies Tendon involvement: none Nerve involvement: none Vascular damage: no Patient sedated: no Preparation: Patient was prepped and draped in the usual sterile fashion. Irrigation solution: saline Amount of cleaning: standard Debridement: none Degree of undermining: none Skin closure: glue Patient tolerance: Patient tolerated the procedure well with no immediate complications   (including critical care time) Labs Review Labs Reviewed  CBC WITH DIFFERENTIAL/PLATELET - Abnormal; Notable for the following:    WBC 11.2 (*)    Hemoglobin 12.4 (*)    MCH 25.6 (*)    RDW 17.2 (*)    Platelets 142 (*)    Neutrophils Relative % 88 (*)    Neutro Abs 9.9 (*)    Lymphocytes Relative 6 (*)    Lymphs Abs 0.6 (*)    All other components within normal limits  COMPREHENSIVE METABOLIC PANEL - Abnormal; Notable for the following:    Glucose, Bld 108 (*)    BUN 35 (*)    Creatinine, Ser 1.61 (*)    Calcium 8.3 (*)    Total Protein 5.5 (*)    Albumin 3.0 (*)    ALT 76 (*)     GFR calc non Af Amer 40 (*)    GFR calc Af Amer 47 (*)    All other components within normal limits  URINALYSIS, ROUTINE W REFLEX MICROSCOPIC    Imaging Review Ct Head Wo Contrast  06/23/2014   CLINICAL DATA:  Larey Seat about 8 hr ago now with injury to the right side of  the head and ear.  EXAM: CT HEAD WITHOUT CONTRAST  TECHNIQUE: Contiguous axial images were obtained from the base of the skull through the vertex without intravenous contrast.  COMPARISON:  09/19/2011; 09/11/2011  FINDINGS: Similar findings of atrophy with diffuse sulcal prominence, centralized volume loss and mild commensurate expected dilatation of the ventricular system. Scattered periventricular hypodensities are unchanged compatible with microvascular ischemic disease. The gray-white differentiation is otherwise well maintained without CT evidence of acute large territory infarct. No intraparenchymal or extra-axial mass or hemorrhage. Unchanged size and configuration of the ventricles and basilar cisterns. No midline shift. Intracranial atherosclerosis. Limited visualization of the paranasal sinuses and mastoid air cells is normal. No air-fluid levels. Regional soft tissues appear normal. Post left-sided cataract surgery.  IMPRESSION: Atrophy and microvascular ischemic disease without acute intracranial process.   Electronically Signed   By: Simonne Come M.D.   On: 06/23/2014 15:01   Dg Hip Unilat  With Pelvis 2-3 Views Right  06/23/2014   CLINICAL DATA:  Status post fall today while getting out of bed; right hip pain  EXAM: RIGHT HIP (WITH PELVIS) 2-3 VIEWS  COMPARISON:  Supine abdominal film of January 04, 2014 which includes a portion of the hips  FINDINGS: The bony pelvis is osteopenic. No acute pelvic fracture is demonstrated. The hip joint spaces are preserved. The observed portions of the sacrum are normal. There are common iliac stents bilaterally.  AP and frog-leg lateral views of the right hip reveal the joint space to be normal.  The acetabulum is intact. The femoral head, neck, and intertrochanteric regions are normal in appearance.  IMPRESSION: There is no acute bony abnormality of the right hip.   Electronically Signed   By: David  [place]   On: 06/23/2014 14:05     EKG Interpretation   Date/Time:  Friday June 23 2014 14:02:53 EST Ventricular Rate:  74 PR Interval:  138 QRS Duration: 91 QT Interval:  329 QTC Calculation: 365 R Axis:   40 Text Interpretation:  Sinus rhythm Probable left atrial enlargement  Posterior infarct, old No significant change since last tracing Confirmed  by Johnmark Geiger  MD, Jeric Slagel ([id number]) on 06/23/2014 3:24:46 PM      MDM   Final diagnoses:  Fall, initial encounter  Right hip pain  Ear lobe laceration, right, initial encounter    1:48 PM 74 y.o. male w hx of CAD, COPD, CVA, CKD who presents with a fall. He states that when stepping out of bed this morning his right leg gave out causing him to fall and hit his head against the door. He denies any loss of consciousness. Is currently complaint is right hip pain. He has a laceration to his right earlobe but refuses repair. We'll get screening labs and imaging. Recent admit for copd exac.   Pt refused stitches for the earlobe laceration. Alternatively I recommended glue and he agreed to this.  Patient unable to ambulate. Will admit to hospitalist.  Purvis Sheffield, MD 06/23/14 [phone]

## 2014-06-23 NOTE — ED Notes (Signed)
Pt went to x-ray.

## 2014-06-23 NOTE — ED Notes (Signed)
MD at bedside. 

## 2014-06-23 NOTE — ED Notes (Signed)
Patient transported to CT 

## 2014-06-24 ENCOUNTER — Inpatient Hospital Stay (HOSPITAL_COMMUNITY): Payer: Medicare PPO

## 2014-06-24 DIAGNOSIS — I2510 Atherosclerotic heart disease of native coronary artery without angina pectoris: Secondary | ICD-10-CM

## 2014-06-24 DIAGNOSIS — J9621 Acute and chronic respiratory failure with hypoxia: Secondary | ICD-10-CM | POA: Diagnosis present

## 2014-06-24 DIAGNOSIS — I739 Peripheral vascular disease, unspecified: Secondary | ICD-10-CM | POA: Diagnosis present

## 2014-06-24 DIAGNOSIS — Z66 Do not resuscitate: Secondary | ICD-10-CM | POA: Diagnosis present

## 2014-06-24 DIAGNOSIS — J9620 Acute and chronic respiratory failure, unspecified whether with hypoxia or hypercapnia: Secondary | ICD-10-CM | POA: Diagnosis present

## 2014-06-24 DIAGNOSIS — R627 Adult failure to thrive: Secondary | ICD-10-CM | POA: Diagnosis present

## 2014-06-24 DIAGNOSIS — N183 Chronic kidney disease, stage 3 (moderate): Secondary | ICD-10-CM | POA: Diagnosis present

## 2014-06-24 DIAGNOSIS — I2699 Other pulmonary embolism without acute cor pulmonale: Secondary | ICD-10-CM | POA: Diagnosis present

## 2014-06-24 DIAGNOSIS — D638 Anemia in other chronic diseases classified elsewhere: Secondary | ICD-10-CM | POA: Diagnosis present

## 2014-06-24 DIAGNOSIS — Z7952 Long term (current) use of systemic steroids: Secondary | ICD-10-CM | POA: Diagnosis not present

## 2014-06-24 DIAGNOSIS — I340 Nonrheumatic mitral (valve) insufficiency: Secondary | ICD-10-CM | POA: Diagnosis present

## 2014-06-24 DIAGNOSIS — M25551 Pain in right hip: Secondary | ICD-10-CM | POA: Diagnosis present

## 2014-06-24 DIAGNOSIS — G9340 Encephalopathy, unspecified: Secondary | ICD-10-CM | POA: Diagnosis present

## 2014-06-24 DIAGNOSIS — R6520 Severe sepsis without septic shock: Secondary | ICD-10-CM | POA: Diagnosis not present

## 2014-06-24 DIAGNOSIS — M109 Gout, unspecified: Secondary | ICD-10-CM | POA: Diagnosis present

## 2014-06-24 DIAGNOSIS — J189 Pneumonia, unspecified organism: Secondary | ICD-10-CM | POA: Diagnosis present

## 2014-06-24 DIAGNOSIS — J449 Chronic obstructive pulmonary disease, unspecified: Secondary | ICD-10-CM | POA: Diagnosis present

## 2014-06-24 DIAGNOSIS — I5043 Acute on chronic combined systolic (congestive) and diastolic (congestive) heart failure: Secondary | ICD-10-CM

## 2014-06-24 DIAGNOSIS — Z6822 Body mass index (BMI) 22.0-22.9, adult: Secondary | ICD-10-CM | POA: Diagnosis not present

## 2014-06-24 DIAGNOSIS — N529 Male erectile dysfunction, unspecified: Secondary | ICD-10-CM | POA: Diagnosis present

## 2014-06-24 DIAGNOSIS — K567 Ileus, unspecified: Secondary | ICD-10-CM | POA: Diagnosis not present

## 2014-06-24 DIAGNOSIS — L89159 Pressure ulcer of sacral region, unspecified stage: Secondary | ICD-10-CM | POA: Diagnosis present

## 2014-06-24 DIAGNOSIS — S32810A Multiple fractures of pelvis with stable disruption of pelvic ring, initial encounter for closed fracture: Secondary | ICD-10-CM | POA: Diagnosis present

## 2014-06-24 DIAGNOSIS — S32110A Nondisplaced Zone I fracture of sacrum, initial encounter for closed fracture: Secondary | ICD-10-CM | POA: Diagnosis present

## 2014-06-24 DIAGNOSIS — Y95 Nosocomial condition: Secondary | ICD-10-CM | POA: Diagnosis present

## 2014-06-24 DIAGNOSIS — Z955 Presence of coronary angioplasty implant and graft: Secondary | ICD-10-CM | POA: Diagnosis not present

## 2014-06-24 DIAGNOSIS — Z79899 Other long term (current) drug therapy: Secondary | ICD-10-CM | POA: Diagnosis not present

## 2014-06-24 DIAGNOSIS — N186 End stage renal disease: Secondary | ICD-10-CM | POA: Diagnosis present

## 2014-06-24 DIAGNOSIS — W19XXXD Unspecified fall, subsequent encounter: Secondary | ICD-10-CM

## 2014-06-24 DIAGNOSIS — Z8249 Family history of ischemic heart disease and other diseases of the circulatory system: Secondary | ICD-10-CM | POA: Diagnosis not present

## 2014-06-24 DIAGNOSIS — F17210 Nicotine dependence, cigarettes, uncomplicated: Secondary | ICD-10-CM | POA: Diagnosis present

## 2014-06-24 DIAGNOSIS — Z515 Encounter for palliative care: Secondary | ICD-10-CM | POA: Diagnosis not present

## 2014-06-24 DIAGNOSIS — S73024A Obturator dislocation of right hip, initial encounter: Secondary | ICD-10-CM | POA: Diagnosis present

## 2014-06-24 DIAGNOSIS — M25559 Pain in unspecified hip: Secondary | ICD-10-CM | POA: Diagnosis present

## 2014-06-24 DIAGNOSIS — Z8673 Personal history of transient ischemic attack (TIA), and cerebral infarction without residual deficits: Secondary | ICD-10-CM | POA: Diagnosis not present

## 2014-06-24 DIAGNOSIS — N390 Urinary tract infection, site not specified: Secondary | ICD-10-CM | POA: Diagnosis not present

## 2014-06-24 DIAGNOSIS — N179 Acute kidney failure, unspecified: Secondary | ICD-10-CM | POA: Diagnosis not present

## 2014-06-24 DIAGNOSIS — E785 Hyperlipidemia, unspecified: Secondary | ICD-10-CM | POA: Diagnosis present

## 2014-06-24 DIAGNOSIS — K219 Gastro-esophageal reflux disease without esophagitis: Secondary | ICD-10-CM | POA: Diagnosis present

## 2014-06-24 DIAGNOSIS — Z951 Presence of aortocoronary bypass graft: Secondary | ICD-10-CM | POA: Diagnosis not present

## 2014-06-24 DIAGNOSIS — S3210XA Unspecified fracture of sacrum, initial encounter for closed fracture: Secondary | ICD-10-CM | POA: Diagnosis present

## 2014-06-24 DIAGNOSIS — Z9119 Patient's noncompliance with other medical treatment and regimen: Secondary | ICD-10-CM | POA: Diagnosis present

## 2014-06-24 DIAGNOSIS — E43 Unspecified severe protein-calorie malnutrition: Secondary | ICD-10-CM | POA: Diagnosis present

## 2014-06-24 DIAGNOSIS — I5023 Acute on chronic systolic (congestive) heart failure: Secondary | ICD-10-CM | POA: Clinically undetermined

## 2014-06-24 DIAGNOSIS — I071 Rheumatic tricuspid insufficiency: Secondary | ICD-10-CM | POA: Diagnosis present

## 2014-06-24 DIAGNOSIS — T501X5A Adverse effect of loop [high-ceiling] diuretics, initial encounter: Secondary | ICD-10-CM | POA: Diagnosis not present

## 2014-06-24 DIAGNOSIS — S01311A Laceration without foreign body of right ear, initial encounter: Secondary | ICD-10-CM | POA: Diagnosis present

## 2014-06-24 DIAGNOSIS — E870 Hyperosmolality and hypernatremia: Secondary | ICD-10-CM | POA: Diagnosis not present

## 2014-06-24 DIAGNOSIS — I4892 Unspecified atrial flutter: Secondary | ICD-10-CM | POA: Diagnosis present

## 2014-06-24 DIAGNOSIS — Z981 Arthrodesis status: Secondary | ICD-10-CM | POA: Diagnosis not present

## 2014-06-24 DIAGNOSIS — Z7982 Long term (current) use of aspirin: Secondary | ICD-10-CM | POA: Diagnosis not present

## 2014-06-24 DIAGNOSIS — D696 Thrombocytopenia, unspecified: Secondary | ICD-10-CM | POA: Diagnosis not present

## 2014-06-24 DIAGNOSIS — I130 Hypertensive heart and chronic kidney disease with heart failure and stage 1 through stage 4 chronic kidney disease, or unspecified chronic kidney disease: Secondary | ICD-10-CM | POA: Diagnosis present

## 2014-06-24 DIAGNOSIS — I252 Old myocardial infarction: Secondary | ICD-10-CM | POA: Diagnosis not present

## 2014-06-24 DIAGNOSIS — W19XXXA Unspecified fall, initial encounter: Secondary | ICD-10-CM | POA: Diagnosis present

## 2014-06-24 DIAGNOSIS — Z809 Family history of malignant neoplasm, unspecified: Secondary | ICD-10-CM | POA: Diagnosis not present

## 2014-06-24 DIAGNOSIS — I480 Paroxysmal atrial fibrillation: Secondary | ICD-10-CM | POA: Diagnosis not present

## 2014-06-24 DIAGNOSIS — A4151 Sepsis due to Escherichia coli [E. coli]: Secondary | ICD-10-CM | POA: Diagnosis not present

## 2014-06-24 LAB — CBC
HEMATOCRIT: 41.1 % (ref 39.0–52.0)
HEMOGLOBIN: 12.5 g/dL — AB (ref 13.0–17.0)
MCH: 25.7 pg — ABNORMAL LOW (ref 26.0–34.0)
MCHC: 30.4 g/dL (ref 30.0–36.0)
MCV: 84.6 fL (ref 78.0–100.0)
PLATELETS: 147 10*3/uL — AB (ref 150–400)
RBC: 4.86 MIL/uL (ref 4.22–5.81)
RDW: 17.3 % — AB (ref 11.5–15.5)
WBC: 10.3 10*3/uL (ref 4.0–10.5)

## 2014-06-24 LAB — BASIC METABOLIC PANEL
Anion gap: 5 (ref 5–15)
BUN: 32 mg/dL — ABNORMAL HIGH (ref 6–23)
CO2: 33 mmol/L — ABNORMAL HIGH (ref 19–32)
CREATININE: 1.9 mg/dL — AB (ref 0.50–1.35)
Calcium: 8.1 mg/dL — ABNORMAL LOW (ref 8.4–10.5)
Chloride: 102 mmol/L (ref 96–112)
GFR calc Af Amer: 38 mL/min — ABNORMAL LOW (ref >90)
GFR calc non Af Amer: 33 mL/min — ABNORMAL LOW (ref >90)
GLUCOSE: 102 mg/dL — AB (ref 70–99)
POTASSIUM: 3.9 mmol/L (ref 3.5–5.1)
Sodium: 140 mmol/L (ref 135–145)

## 2014-06-24 LAB — TROPONIN I
Troponin I: 0.06 ng/mL — ABNORMAL HIGH (ref <0.031)
Troponin I: 0.06 ng/mL — ABNORMAL HIGH (ref <0.031)

## 2014-06-24 LAB — BRAIN NATRIURETIC PEPTIDE: B Natriuretic Peptide: 596.6 pg/mL — ABNORMAL HIGH (ref 0.0–100.0)

## 2014-06-24 LAB — MAGNESIUM: MAGNESIUM: 2.1 mg/dL (ref 1.5–2.5)

## 2014-06-24 LAB — PROTIME-INR
INR: 1.32 (ref 0.00–1.49)
Prothrombin Time: 16.5 seconds — ABNORMAL HIGH (ref 11.6–15.2)

## 2014-06-24 MED ORDER — FUROSEMIDE 40 MG PO TABS
40.0000 mg | ORAL_TABLET | Freq: Every day | ORAL | Status: DC
  Administered 2014-06-24: 40 mg via ORAL
  Filled 2014-06-24: qty 1

## 2014-06-24 MED ORDER — POTASSIUM CHLORIDE ER 10 MEQ PO TBCR
20.0000 meq | EXTENDED_RELEASE_TABLET | Freq: Two times a day (BID) | ORAL | Status: DC
  Administered 2014-06-24 – 2014-07-10 (×28): 20 meq via ORAL
  Filled 2014-06-24 (×48): qty 2

## 2014-06-24 MED ORDER — FUROSEMIDE 10 MG/ML IJ SOLN
40.0000 mg | Freq: Two times a day (BID) | INTRAMUSCULAR | Status: DC
  Administered 2014-06-24 – 2014-06-25 (×2): 40 mg via INTRAVENOUS
  Filled 2014-06-24 (×4): qty 4

## 2014-06-24 NOTE — Consult Note (Signed)
CARDIOLOGY CONSULT NOTE  Referring Physician: Janee Morn Primary Cardiologist: Mendel Ryder Reason for Consultation: CHF   HPI:  Nathan [name] is a 74 y.o. male with history of CAD s/p CABG, AFL s/p ablation (details unclear),PAF (last documented 04/2012 - not on [facility]), chronic combined heart failure, hypertension, severe COPD with ongoing tobacco use previously on home O2, CKD (baseline Cr 1.5-1.7), stroke, GERD, PAD (LE stenting 2005, R CEA 2013), and medication noncompliance.  He underwent CABGx4 in June 2013 but was readmitted in November 2013 for NSTEMI, acute respiratory failure and altered mental status. He underwent emergent catheterization showed patent SVG-RCA and LIMA-LAD but total occlusion of SVG-OM as well as SVG-diag. Medical therapy was recommended. Echo 11/13: EF 50-55% with mild-mod MR. Had LGIB 10/2012 - Colonoscopy performed June 21 showed blood in the colon but no definite source identified, diverticular versus AVM suspected. Aspirin stopped. He was admitted in 8/15 with recurrent HF after he stopped taking meds for 3 months. Echo with EF 45% moderate MR; RV ok. DC Weight was 175lb. He was instructed to f/u in HF Clinic but didn't.   Admitted 12/11-12/14/15  with ADHF. Diuresed. 2D echo 04/22/14: EF 40-45% D/c weight 174.   Readmitted from 06/14/2014 2 06/18/2014 for an acute COPD exacerbation. D/c weight 187  Now readmitted for fall with right obturator ring fracture/right sacral and left fractures/bilateral hip girdle muscular strain. We are asked to consult for recurrent HF. Denies CP. Mild dyspnea and bloating.   Review of Systems:     Cardiac Review of Systems: {Y] = yes  = no  Chest Pain [    ]  Resting SOB [   y] Exertional SOB  Cove.Etienne  ]  Kenese.Mounts [  ]   Pedal Edema [  y ]    Palpitations [  ] Syncope  [  ]   Presyncope [   ]  General Review of Systems: [Y] = yes [  ]=no Constitional: recent weight change [ y ]; anorexia [  ]; fatigue Cove.Etienne  ]; nausea [  ]; night  sweats [  ]; fever [  ]; or chills [  ];                 Eyes : blurred vision [  ]; diplopia [   ]; vision changes [  ];  Amaurosis fugax[  ]; Resp: cough [ y ];  wheezing[  ];  hemoptysis[  ];  PND Cove.Etienne  ];  GI:  gallstones[  ], vomiting[  ];  dysphagia[  ]; melena[  ];  hematochezia [  ]; heartburn[  ];   GU: kidney stones [  ]; hematuria[  ];   dysuria [  ];  nocturia[  ]; incontinence [  ];             Skin: rash, swelling[  ];, hair loss[  ];  peripheral edema[y  ];  or itching[  ]; Musculosketetal: myalgias[  ];  joint swelling[  ];  joint erythema[  ];  joint pain[y  ];  back pain[ y ];  Heme/Lymph: bruising[ y ];  bleeding[  ];  anemia[  ];  Neuro: TIA[  ];  headaches[  ];  stroke[  ];  vertigo[  ];  seizures[  ];   paresthesias[  ];  difficulty walking[  ];  Psych:depression[  ]; anxiety[  ];  Endocrine: diabetes[  ];  thyroid dysfunction[  ];  Other:  Past Medical History  Diagnosis  Date  . Aorto-iliac disease     a. s/p bilat with stent 2007 - followed by [facility].  . CAD (coronary artery disease)     a. 10/2011 s/p CABG x4 (LIMA-LAD, SVG-Diag, SVG-OM1 and SVG-PDA)  b. 03/2012 NSTEMI s/p LHC SVG-RCA and LIMA-LAD but total occlusion of SVG-OM as well as SVG-diag. RX therapy recommended   . Hypertension   . ED (erectile dysfunction)   . COPD, severe   . Hyperlipidemia   . Stroke   . GERD (gastroesophageal reflux disease)   . Gout   . Paroxysmal atrial flutter     ablation  . Chronic combined systolic and diastolic CHF (congestive heart failure)     a. Last echo 04/2014: EF 40-45%.  Marland Kitchen. PAF (paroxysmal atrial fibrillation)   . CKD (chronic kidney disease), stage III   . Tobacco abuse   . History of noncompliance with medical treatment   . Carotid artery disease     a. RCEA 2013 - followed by [facility].  . Anemia   . Lower GI bleed     a. LGIB 10/2012: Colonoscopy performed June 21 showed blood in the colon but no definite source identified, diverticular versus AVM suspected. Aspirin  stopped.  . Mitral regurgitation   . Hypertensive heart disease     Medications Prior to Admission  Medication Sig Dispense Refill  . albuterol (PROVENTIL HFA;VENTOLIN HFA) 108 (90 BASE) MCG/ACT inhaler Inhale 1-2 puffs into the lungs every 6 (six) hours as needed for wheezing or shortness of breath. 1 Inhaler 0  . albuterol (PROVENTIL) (2.5 MG/3ML) 0.083% nebulizer solution Take 3 mLs (2.5 mg total) by nebulization every 4 (four) hours as needed for wheezing or shortness of breath. 30 vial 0  . aspirin 81 MG chewable tablet Chew 1 tablet (81 mg total) by mouth daily. 30 tablet 1  . atorvastatin (LIPITOR) 10 MG tablet Take 1 tablet (10 mg total) by mouth daily at 6 PM. 30 tablet 1  . buPROPion (WELLBUTRIN XL) 300 MG 24 hr tablet Take 300 mg by mouth daily.    . carvedilol (COREG) 12.5 MG tablet Take 1 tablet (12.5 mg total) by mouth 2 (two) times daily with a meal. 60 tablet 0  . colchicine 0.6 MG tablet Take 0.6 mg by mouth daily.    Marland Kitchen. eplerenone (INSPRA) 25 MG tablet Take 0.5 tablets (12.5 mg total) by mouth daily. 15 tablet 5  . furosemide (LASIX) 40 MG tablet Take 1 tablet (40 mg total) by mouth daily. 30 tablet 5  . isosorbide mononitrate (IMDUR) 30 MG 24 hr tablet Take 0.5 tablets (15 mg total) by mouth daily. 30 tablet 0  . Melatonin 10 MG TABS Take 10 mg by mouth at bedtime.    . mometasone-formoterol (DULERA) 100-5 MCG/ACT AERO Inhale 2 puffs into the lungs 2 (two) times daily. 1 Inhaler 0  . tiotropium (SPIRIVA) 18 MCG inhalation capsule Place 1 capsule (18 mcg total) into inhaler and inhale daily. 30 capsule 12  . nitroGLYCERIN (NITROSTAT) 0.4 MG SL tablet Place 0.4 mg under the tongue every 5 (five) minutes as needed for chest pain.       Marland Kitchen. aspirin  81 mg Oral Daily  . atorvastatin  10 mg Oral q1800  . buPROPion  300 mg Oral Daily  . carvedilol  12.5 mg Oral BID WC  . colchicine  0.6 mg Oral Daily  . docusate sodium  100 mg Oral BID  . enoxaparin (LOVENOX) injection  40 mg  Subcutaneous Q24H  .  furosemide  40 mg Intravenous BID  . isosorbide mononitrate  15 mg Oral Daily  . mometasone-formoterol  2 puff Inhalation BID  . nicotine  21 mg Transdermal Daily  . pantoprazole  40 mg Oral Daily  . sodium chloride  3 mL Intravenous Q12H  . spironolactone  25 mg Oral Daily  . tiotropium  18 mcg Inhalation Daily    Infusions:    No Known Allergies  History   Social History  . Marital Status: Married    Spouse Name: N/A  . Number of Children: N/A  . Years of Education: N/A   Occupational History  . retired    Social History Main Topics  . Smoking status: Current Some Day Smoker -- 1.00 packs/day for 64 years    Types: Cigarettes  . Smokeless tobacco: Never Used  . 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 Drug Use: No  . Sexual Activity: Not on file   Other Topics Concern  . Not on file   Social History Narrative    Family History  Problem Relation Age of Onset  . Cancer Mother   . Tuberculosis Mother   . Heart disease Father     He did NOT have it before age 1    PHYSICAL EXAM: Filed Vitals:   06/24/14 1524  BP: 123/68  Pulse: 75  Temp: 97.7 F (36.5 C)  Resp:      Intake/Output Summary (Last 24 hours) at 06/24/14 1542 Last data filed at 06/24/14 1455  Gross per 24 hour  Intake    720 ml  Output   2300 ml  Net  -1580 ml    General: Lying in bed NAD HEENT: normal Neck: supple. JVP 8 Carotids 2+ bilat; no bruits. No lymphadenopathy or thryomegaly appreciated. Cor: PMI nondisplaced. Recgular. No rubs, gallops or murmurs. Lungs: decreased at bases. diminished throughout. + basilar crackles Abdomen: soft, nontender, +mildly distended. No hepatosplenomegaly. No bruits or masses. Good bowel sounds. Extremities: no cyanosis, clubbing, rash, 1+ edema Neuro: alert & oriented x 3, cranial nerves grossly intact.  moves all 4 extremities w/o difficulty. Affect pleasant.  ECG: NSR 74. Nonspecifc ST-T wave abnormalities. LAE   Results for orders placed or performed during the hospital encounter of 06/23/14 (from the past 24 hour(s))  Basic metabolic panel     Status: Abnormal   Collection Time: 06/24/14  5:00 AM  Result Value Ref Range   Sodium 140 135 - 145 mmol/L   Potassium 3.9 3.5 - 5.1 mmol/L   Chloride 102 96 - 112 mmol/L   CO2 33 (H) 19 - 32 mmol/L   Glucose, Bld 102 (H) 70 - 99 mg/dL   BUN 32 (H) 6 - 23 mg/dL   Creatinine, Ser 1.61 (H) 0.50 - 1.35 mg/dL   Calcium 8.1 (L) 8.4 - 10.5 mg/dL   GFR calc non Af Amer 33 (L) >90 mL/min   GFR calc Af Amer 38 (L) >90 mL/min   Anion gap 5 5 - 15  CBC     Status: Abnormal   Collection Time: 06/24/14  5:00 AM  Result Value Ref Range   WBC 10.3 4.0 - 10.5 K/uL   RBC 4.86 4.22 - 5.81 MIL/uL   Hemoglobin 12.5 (L) 13.0 - 17.0 g/dL   HCT 09.6 04.5 - 40.9 %  MCV 84.6 78.0 - 100.0 fL   MCH 25.7 (L) 26.0 - 34.0 pg   MCHC 30.4 30.0 - 36.0 g/dL   RDW 78.2 (H) 95.6 - 21.3 %   Platelets 147 (L) 150 - 400 K/uL  Protime-INR     Status: Abnormal   Collection Time: 06/24/14  5:00 AM  Result Value Ref Range   Prothrombin Time 16.5 (H) 11.6 - 15.2 seconds   INR 1.32 0.00 - 1.49  Troponin I (q 6hr x 3)     Status: Abnormal   Collection Time: 06/24/14  2:19 PM  Result Value Ref Range   Troponin I 0.06 (H) <0.031 ng/mL  Brain natriuretic peptide     Status: Abnormal   Collection Time: 06/24/14  2:20 PM  Result Value Ref Range   B Natriuretic Peptide 596.6 (H) 0.0 - 100.0 pg/mL   Ct Head Wo Contrast  06/23/2014   CLINICAL DATA:  Larey Seat about 8 hr ago now with injury to the right side of the head and ear.  EXAM: CT HEAD WITHOUT CONTRAST  TECHNIQUE: Contiguous axial images were obtained from the base of the skull through the vertex without intravenous contrast.  COMPARISON:  09/19/2011; 09/11/2011  FINDINGS: Similar findings of atrophy with diffuse sulcal  prominence, centralized volume loss and mild commensurate expected dilatation of the ventricular system. Scattered periventricular hypodensities are unchanged compatible with microvascular ischemic disease. The gray-white differentiation is otherwise well maintained without CT evidence of acute large territory infarct. No intraparenchymal or extra-axial mass or hemorrhage. Unchanged size and configuration of the ventricles and basilar cisterns. No midline shift. Intracranial atherosclerosis. Limited visualization of the paranasal sinuses and mastoid air cells is normal. No air-fluid levels. Regional soft tissues appear normal. Post left-sided cataract surgery.  IMPRESSION: Atrophy and microvascular ischemic disease without acute intracranial process.   Electronically Signed   By: Simonne Come M.D.   On: 06/23/2014 15:01   Mr Hip Right Wo Contrast  06/24/2014   CLINICAL DATA:  RIGHT hip pain.  Recent fall.  Occult fracture.  EXAM: MR OF THE RIGHT HIP WITHOUT CONTRAST  TECHNIQUE: Multiplanar, multisequence MR imaging was performed. No intravenous contrast was administered.  COMPARISON:  Radiographs 06/23/2014.  FINDINGS: Nondisplaced RIGHT obturator ring fractures are present. Fracture in the mid RIGHT inferior pubic ramus. There is also a fracture of the root of the RIGHT superior pubic ramus. Additionally, there is a complementary RIGHT sacral ala fracture. All of these fractures are nondisplaced and or radiographically occult. Anasarca is present with diffuse subcutaneous edema. LEFT gluteal muscular edema compatible with strain. There is also bilateral RIGHT-greater-than-LEFT adductor compartment strain and reactive edema. Gluteal tendons appear intact. Edema is present in the RIGHT obturator internus and externus consistent with strain and reactive edema from adjacent fractures.  There is no proximal femur fracture. No hip effusion. Negative for bursitis. LEFT inguinal hernia contains a portion of the sigmoid  colon.  IMPRESSION: 1. Nondisplaced RIGHT obturator ring fractures. Complementary nondisplaced RIGHT sacral ala fracture. 2. Bilateral hip girdle muscular strain. 3. Anasarca up. 4. LEFT inguinal hernia containing a loop of sigmoid colon.   Electronically Signed   By: Andreas Newport M.D.   On: 06/24/2014 08:26   Dg Hip Unilat  With Pelvis 2-3 Views Right  06/23/2014   CLINICAL DATA:  Status post fall today while getting out of bed; right hip pain  EXAM: RIGHT HIP (WITH PELVIS) 2-3 VIEWS  COMPARISON:  Supine abdominal film of January 04, 2014 which includes a  portion of the hips  FINDINGS: The bony pelvis is osteopenic. No acute pelvic fracture is demonstrated. The hip joint spaces are preserved. The observed portions of the sacrum are normal. There are common iliac stents bilaterally.  AP and frog-leg lateral views of the right hip reveal the joint space to be normal. The acetabulum is intact. The femoral head, neck, and intertrochanteric regions are normal in appearance.  IMPRESSION: There is no acute bony abnormality of the right hip.   Electronically Signed   By: David  [place]   On: 06/23/2014 14:05     ASSESSMENT: 1) Acute on chronic systolic/diastolic heart failure  --EF 45% by echo 8/15 2) Acute on chronic respiratory failure 3) Fall with pelvic fracture 4) CAD s/p CABG 5) COPD with ongoing tobacco use 6) CKD, stage III 7) h/o GI bleed in 2014 - ASA stopped 8) Noncompliance  PLAN/DISCUSSION:  He has mild volume overload. Probably 8-10 pounds. Agree with diuresing with IV lasix. Will get CXR to reassess recent pleural effusions. CAD is stable. Currently in NSR. Management of pelvic fracture per primary team.   Truman Hayward 4:04 PM

## 2014-06-24 NOTE — Progress Notes (Signed)
CARE MANAGEMENT NOTE 06/24/2014  Patient:  Nathan [name]   Account Number:  [id number]  Date Initiated:  06/24/2014  Documentation initiated by:  Joory Gough  Subjective/Objective Assessment:   fall at home, CHF     Action/Plan:   lives at home with wife   Anticipated DC Date:     Anticipated DC Plan:  SKILLED NURSING FACILITY      DC Planning Services  CM consult      Choice offered to / List presented to:             Status of service:  In process, will continue to follow Medicare Important Message given?   (If response is "NO", the following Medicare IM given date fields will be blank) Date Medicare IM given:   Medicare IM given by:   Date Additional Medicare IM given:   Additional Medicare IM given by:    Discharge Disposition:    Per UR Regulation:    If discussed at Long Length of Stay Meetings, dates discussed:    Comments:  06/24/2014 1100 NCM spoke to pt and states he lives at home with wife. States he fell when getting up from his bed. States he does not have any DME at home. Waiting final recommendations for home. Nathan Calleros RN CCM Case Mgmt phone [phone]   

## 2014-06-24 NOTE — Progress Notes (Signed)
PT Cancellation Note  Patient Details Name: Nathan [name] MRN: [id number] DOB: 05/21/1940   Cancelled Treatment:    Reason Eval/Treat Not Completed: Patient not medically ready Pt just transferred to tele d/t incr SOB; ortho consult pending, will defer PT eval at this time and attempt again next day or as schedule permits;   [facility] CenterWILLIAMS,Jetta Murray 06/24/2014, 3:28 PM

## 2014-06-24 NOTE — Progress Notes (Signed)
Utilization Review completed.  

## 2014-06-24 NOTE — Progress Notes (Signed)
OT Cancellation Note  Patient Details Name: Allen KellMaurice W Lasure MRN: [id number] DOB: 02/19/1941   Cancelled Treatment:    Reason Eval/Treat Not Completed: Medical issues which prohibited therapy. Pt moved to telemetry floor due to SOB, 1st troponin .06, and ortho in process of being consulted for pelvic fractures--we will check on pt tomorrow.  Evette GeorgesLeonard, [place] [phone] 06/24/2014, 3:37 PM

## 2014-06-24 NOTE — Progress Notes (Signed)
[facility] PROGRESS NOTE  DIANE MOCHIZUKI UJW:[id number] DOB: [birth year] DOA: 06/23/2014 PCP: Lillia Mountain, MD  Assessment/Plan: #1 acute on chronic combined systolic and diastolic heart failure Patient with complaints of worsening shortness of breath. Patient with signs of volume overload with JVD and bilateral lower extremity edema. Patient with shallow breath sounds. Will check a pro BNP. Cycle cardiac enzymes every 6 hours 3. Check a EKG. Patient would recent 2-D echo done 04/22/2014 with a EF of 40-45%. Mildly dilated cavity size. Moderate LVH. Aortic valvular sclerosis. Moderate mitral valvular regurgitation. Moderately dilated left atrium. Moderately reduced right ventricular systolic function. Daily weights. Strict I's and O. Will place patient on Lasix 40 mg IV every 12 hours. Continue Coreg, Aldactone, Imdur, Lipitor. Monitor renal function. Consult with cardiology for further evaluation and management.  #2 right obturator ring fracture/right sacral and left fractures/bilateral hip girdle muscular strain/Right hip pain Secondary to mechanical fall. Patient with complaints of pain. Continue pain management. Nonweightbearing on the right lower extremity. PT/OT. Consulted orthopedics for further evaluation and management.  #3 mechanical fall PT/OT.  #4 COPD Gold stage III Stable. No wheezing. Continue home regimen of the R, Spiriva. Nebulizers as needed.  #5 coronary artery disease status post CABG/chronic combined systolic and diastolic heart failure Patient seems as if likely an acute on chronic heart failure. Patient with complaints of worsening shortness of breath. Patient with positive JVD and lower extremity edema. Will cycle enzymes. Place on IV Lasix. Continue home regimen of Coreg, Aldactone, indoor, Lipitor. Cardiology consultation pending.  #6 chronic kidney disease stage III Stable. Monitor closely with diuresis.  #7 tobacco abuse Tobacco cessation. Nicotine  patch.  #8 hypertension Stable. Continue current regimen.  #9 ear lobe laceration Status post repair. ED physician.  #10 prophylaxis PPI for GI prophylaxis. Lovenox for DVT prophylaxis.  Code Status: Full Family Communication: Updated patient. No family at bedside. Disposition Plan: Transfer to telemetry.   Consultants:  Cardiology pending  Orthopedics pending  Procedures:  Plain films of the right hip 06/23/2014  MRI of the right hip 06/23/2014  Antibiotics:  None  HPI/Subjective: Patient complaining of worsening shortness of breath. Patient also complaining of right hip pain.  Objective: Filed Vitals:   06/24/14 1409  BP: 124/62  Pulse: 72  Temp:   Resp: 18    Intake/Output Summary (Last 24 hours) at 06/24/14 1416 Last data filed at 06/24/14 1204  Gross per 24 hour  Intake    360 ml  Output   2250 ml  Net  -1890 ml   Filed Weights   06/23/14 1255 06/24/14 0500  Weight: 84.369 kg (186 lb) 85.1 kg (187 lb 9.8 oz)    Exam:   General:  NAD  Cardiovascular: RRR. + JVD  Respiratory: Shallow breath sounds.  Abdomen: Soft/NT/ND/+BS  Musculoskeletal: No c/c. 2-3 + BLE edema  Data Reviewed: Basic Metabolic Panel:  Recent Labs Lab 06/18/14 0523 06/23/14 1412 06/23/14 1413 06/24/14 0500  NA 140  --  142 140  K 4.7  --  5.0 3.9  CL 105  --  105 102  CO2 28  --  30 33*  GLUCOSE 94  --  108* 102*  BUN 50*  --  35* 32*  CREATININE 1.77*  --  1.61* 1.90*  CALCIUM 8.5  --  8.3* 8.1*  MG  --  2.1  --   --    Liver Function Tests:  Recent Labs Lab 06/23/14 1413  AST 28  ALT 76*  ALKPHOS  53  BILITOT 0.7  PROT 5.5*  ALBUMIN 3.0*   No results for input(s): LIPASE, AMYLASE in the last 168 hours. No results for input(s): AMMONIA in the last 168 hours. CBC:  Recent Labs Lab 06/23/14 1413 06/24/14 0500  WBC 11.2* 10.3  NEUTROABS 9.9*  --   HGB 12.4* 12.5*  HCT 41.3 41.1  MCV 85.3 84.6  PLT 142* 147*   Cardiac Enzymes: No  results for input(s): CKTOTAL, CKMB, CKMBINDEX, TROPONINI in the last 168 hours. BNP (last 3 results) No results for input(s): BNP in the last 8760 hours.  ProBNP (last 3 results)  Recent Labs  01/04/14 0450 04/21/14 1504  PROBNP 3786.0* 13092.0*    CBG: No results for input(s): GLUCAP in the last 168 hours.  No results found for this or any previous visit (from the past 240 hour(s)).   Studies: Ct Head Wo Contrast  06/23/2014   CLINICAL DATA:  Larey SeatFell about 8 hr ago now with injury to the right side of the head and ear.  EXAM: CT HEAD WITHOUT CONTRAST  TECHNIQUE: Contiguous axial images were obtained from the base of the skull through the vertex without intravenous contrast.  COMPARISON:  09/19/2011; 09/11/2011  FINDINGS: Similar findings of atrophy with diffuse sulcal prominence, centralized volume loss and mild commensurate expected dilatation of the ventricular system. Scattered periventricular hypodensities are unchanged compatible with microvascular ischemic disease. The gray-white differentiation is otherwise well maintained without CT evidence of acute large territory infarct. No intraparenchymal or extra-axial mass or hemorrhage. Unchanged size and configuration of the ventricles and basilar cisterns. No midline shift. Intracranial atherosclerosis. Limited visualization of the paranasal sinuses and mastoid air cells is normal. No air-fluid levels. Regional soft tissues appear normal. Post left-sided cataract surgery.  IMPRESSION: Atrophy and microvascular ischemic disease without acute intracranial process.   Electronically Signed   By: Simonne ComeJohn  Watts M.D.   On: 06/23/2014 15:01   Mr Hip Right Wo Contrast  06/24/2014   CLINICAL DATA:  RIGHT hip pain.  Recent fall.  Occult fracture.  EXAM: MR OF THE RIGHT HIP WITHOUT CONTRAST  TECHNIQUE: Multiplanar, multisequence MR imaging was performed. No intravenous contrast was administered.  COMPARISON:  Radiographs 06/23/2014.  FINDINGS: Nondisplaced  RIGHT obturator ring fractures are present. Fracture in the mid RIGHT inferior pubic ramus. There is also a fracture of the root of the RIGHT superior pubic ramus. Additionally, there is a complementary RIGHT sacral ala fracture. All of these fractures are nondisplaced and or radiographically occult. Anasarca is present with diffuse subcutaneous edema. LEFT gluteal muscular edema compatible with strain. There is also bilateral RIGHT-greater-than-LEFT adductor compartment strain and reactive edema. Gluteal tendons appear intact. Edema is present in the RIGHT obturator internus and externus consistent with strain and reactive edema from adjacent fractures.  There is no proximal femur fracture. No hip effusion. Negative for bursitis. LEFT inguinal hernia contains a portion of the sigmoid colon.  IMPRESSION: 1. Nondisplaced RIGHT obturator ring fractures. Complementary nondisplaced RIGHT sacral ala fracture. 2. Bilateral hip girdle muscular strain. 3. Anasarca up. 4. LEFT inguinal hernia containing a loop of sigmoid colon.   Electronically Signed   By: Andreas NewportGeoffrey  Lamke M.D.   On: 06/24/2014 08:26   Dg Hip Unilat  With Pelvis 2-3 Views Right  06/23/2014   CLINICAL DATA:  Status post fall today while getting out of bed; right hip pain  EXAM: RIGHT HIP (WITH PELVIS) 2-3 VIEWS  COMPARISON:  Supine abdominal film of January 04, 2014 which includes a portion  of the hips  FINDINGS: The bony pelvis is osteopenic. No acute pelvic fracture is demonstrated. The hip joint spaces are preserved. The observed portions of the sacrum are normal. There are common iliac stents bilaterally.  AP and frog-leg lateral views of the right hip reveal the joint space to be normal. The acetabulum is intact. The femoral head, neck, and intertrochanteric regions are normal in appearance.  IMPRESSION: There is no acute bony abnormality of the right hip.   Electronically Signed   By: David  [place]   On: 06/23/2014 14:05    Scheduled Meds: .  aspirin  81 mg Oral Daily  . atorvastatin  10 mg Oral q1800  . buPROPion  300 mg Oral Daily  . carvedilol  12.5 mg Oral BID WC  . colchicine  0.6 mg Oral Daily  . docusate sodium  100 mg Oral BID  . enoxaparin (LOVENOX) injection  40 mg Subcutaneous Q24H  . furosemide  40 mg Intravenous BID  . isosorbide mononitrate  15 mg Oral Daily  . mometasone-formoterol  2 puff Inhalation BID  . nicotine  21 mg Transdermal Daily  . pantoprazole  40 mg Oral Daily  . sodium chloride  3 mL Intravenous Q12H  . spironolactone  25 mg Oral Daily  . tiotropium  18 mcg Inhalation Daily   Continuous Infusions:   Principal Problem:   Acute on chronic systolic CHF (congestive heart failure) Active Problems:   COPD (chronic obstructive pulmonary disease), gold stage C.   Hyperlipidemia   S/P CABG (coronary artery bypass graft)   Chronic kidney disease (CKD), stage III (moderate)   Tobacco use disorder   PVD (peripheral vascular disease)   Essential hypertension   PAF (paroxysmal atrial fibrillation)   CAD (coronary artery disease)   Chronic combined systolic and diastolic CHF (congestive heart failure)   Anemia   Hypertensive heart disease   Right hip pain   Fall   Ear lobe laceration   Hip pain, acute   Acute hip pain   Dislocation of hip, obturator, right, closed   Sacral fracture, closed    Time spent:     [facility] MD  [facility] Pager [phone]. If 7PM-7AM, please contact night-coverage at [email or web address], password [facility] 06/24/2014, 2:16 PM  LOS: 0 days

## 2014-06-24 NOTE — Progress Notes (Signed)
CARE MANAGEMENT NOTE 06/24/2014  Patient:  Nathan [name],Nathan [name]   Account Number:  [id number]  Date Initiated:  06/24/2014  Documentation initiated by:  [name]  Subjective/Objective Assessment:   fall at home, CHF     Action/Plan:   lives at home with wife   Anticipated DC Date:     Anticipated DC Plan:  SKILLED NURSING FACILITY      DC Planning Services  CM consult      Choice offered to / List presented to:             Status of service:  In process, will continue to follow Medicare Important Message given?   (If response is "NO", the following Medicare IM given date fields will be blank) Date Medicare IM given:   Medicare IM given by:   Date Additional Medicare IM given:   Additional Medicare IM given by:    Discharge Disposition:    Per UR Regulation:    If discussed at Long Length of Stay Meetings, dates discussed:    Comments:  06/24/2014 1100 NCM spoke to pt and states he lives at home with wife. States he fell when getting up from his bed. States he does not have any DME at home. Waiting final recommendations for home. Nathan DonningAlesia Exie Chrismer RN CCM Case Mgmt phone [phone]

## 2014-06-25 ENCOUNTER — Inpatient Hospital Stay (HOSPITAL_COMMUNITY): Payer: Medicare PPO

## 2014-06-25 DIAGNOSIS — S3210XD Unspecified fracture of sacrum, subsequent encounter for fracture with routine healing: Secondary | ICD-10-CM

## 2014-06-25 DIAGNOSIS — R0603 Acute respiratory distress: Secondary | ICD-10-CM | POA: Diagnosis present

## 2014-06-25 LAB — BASIC METABOLIC PANEL
Anion gap: 6 (ref 5–15)
BUN: 34 mg/dL — ABNORMAL HIGH (ref 6–23)
CALCIUM: 8 mg/dL — AB (ref 8.4–10.5)
CO2: 31 mmol/L (ref 19–32)
Chloride: 100 mmol/L (ref 96–112)
Creatinine, Ser: 1.96 mg/dL — ABNORMAL HIGH (ref 0.50–1.35)
GFR calc Af Amer: 37 mL/min — ABNORMAL LOW (ref >90)
GFR calc non Af Amer: 32 mL/min — ABNORMAL LOW (ref >90)
Glucose, Bld: 127 mg/dL — ABNORMAL HIGH (ref 70–99)
Potassium: 3.9 mmol/L (ref 3.5–5.1)
Sodium: 137 mmol/L (ref 135–145)

## 2014-06-25 LAB — CBC WITH DIFFERENTIAL/PLATELET
BASOS ABS: 0 10*3/uL (ref 0.0–0.1)
Basophils Relative: 0 % (ref 0–1)
EOS PCT: 1 % (ref 0–5)
Eosinophils Absolute: 0.1 10*3/uL (ref 0.0–0.7)
HEMATOCRIT: 41 % (ref 39.0–52.0)
Hemoglobin: 12.5 g/dL — ABNORMAL LOW (ref 13.0–17.0)
Lymphocytes Relative: 7 % — ABNORMAL LOW (ref 12–46)
Lymphs Abs: 0.7 10*3/uL (ref 0.7–4.0)
MCH: 25.6 pg — ABNORMAL LOW (ref 26.0–34.0)
MCHC: 30.5 g/dL (ref 30.0–36.0)
MCV: 84 fL (ref 78.0–100.0)
MONOS PCT: 7 % (ref 3–12)
Monocytes Absolute: 0.7 10*3/uL (ref 0.1–1.0)
NEUTROS PCT: 85 % — AB (ref 43–77)
Neutro Abs: 9.2 10*3/uL — ABNORMAL HIGH (ref 1.7–7.7)
Platelets: 136 10*3/uL — ABNORMAL LOW (ref 150–400)
RBC: 4.88 MIL/uL (ref 4.22–5.81)
RDW: 17.3 % — AB (ref 11.5–15.5)
WBC: 10.8 10*3/uL — ABNORMAL HIGH (ref 4.0–10.5)

## 2014-06-25 LAB — BLOOD GAS, ARTERIAL
ACID-BASE EXCESS: 3.6 mmol/L — AB (ref 0.0–2.0)
Bicarbonate: 28.1 mEq/L — ABNORMAL HIGH (ref 20.0–24.0)
DRAWN BY: 307971
O2 CONTENT: 1 L/min
O2 SAT: 71.7 %
PCO2 ART: 43.7 mmHg (ref 35.0–45.0)
PH ART: 7.424 (ref 7.350–7.450)
Patient temperature: 37
TCO2: 24.5 mmol/L (ref 0–100)

## 2014-06-25 LAB — HEPARIN LEVEL (UNFRACTIONATED): HEPARIN UNFRACTIONATED: 0.63 [IU]/mL (ref 0.30–0.70)

## 2014-06-25 LAB — TROPONIN I: Troponin I: 0.05 ng/mL — ABNORMAL HIGH (ref <0.031)

## 2014-06-25 LAB — BRAIN NATRIURETIC PEPTIDE: B NATRIURETIC PEPTIDE 5: 539.7 pg/mL — AB (ref 0.0–100.0)

## 2014-06-25 LAB — D-DIMER, QUANTITATIVE: D-Dimer, Quant: 8.68 ug/mL-FEU — ABNORMAL HIGH (ref 0.00–0.48)

## 2014-06-25 MED ORDER — IPRATROPIUM-ALBUTEROL 0.5-2.5 (3) MG/3ML IN SOLN
3.0000 mL | RESPIRATORY_TRACT | Status: DC
  Administered 2014-06-25 – 2014-06-28 (×16): 3 mL via RESPIRATORY_TRACT
  Filled 2014-06-25 (×17): qty 3

## 2014-06-25 MED ORDER — FUROSEMIDE 10 MG/ML IJ SOLN
80.0000 mg | Freq: Once | INTRAMUSCULAR | Status: DC
  Filled 2014-06-25: qty 8

## 2014-06-25 MED ORDER — FUROSEMIDE 10 MG/ML IJ SOLN
40.0000 mg | Freq: Once | INTRAMUSCULAR | Status: AC
  Administered 2014-06-25: 40 mg via INTRAVENOUS

## 2014-06-25 MED ORDER — HEPARIN BOLUS VIA INFUSION
2000.0000 [IU] | Freq: Once | INTRAVENOUS | Status: AC
  Administered 2014-06-25: 2000 [IU] via INTRAVENOUS
  Filled 2014-06-25: qty 2000

## 2014-06-25 MED ORDER — HEPARIN (PORCINE) IN NACL 100-0.45 UNIT/ML-% IJ SOLN
1300.0000 [IU]/h | INTRAMUSCULAR | Status: DC
  Administered 2014-06-25 – 2014-06-27 (×2): 1300 [IU]/h via INTRAVENOUS
  Filled 2014-06-25 (×4): qty 250

## 2014-06-25 MED ORDER — FUROSEMIDE 10 MG/ML IJ SOLN
60.0000 mg | Freq: Two times a day (BID) | INTRAMUSCULAR | Status: DC
  Administered 2014-06-25 – 2014-06-28 (×6): 60 mg via INTRAVENOUS
  Filled 2014-06-25 (×6): qty 6

## 2014-06-25 NOTE — Progress Notes (Signed)
RT having problems with NIV on Servo-i, reading high leak. After trouble shooting RT decided to place PT on V60- problem resolved. RN aware.

## 2014-06-25 NOTE — Progress Notes (Addendum)
Difficulty getting O2 sats. Initial reading of 64% on left hand placed on left ear with reading of 90% bot nose and nailbeds cyanotic with in creased rr. In formed CNA to let RN know and call for Oxygen order and review patients symptoms

## 2014-06-25 NOTE — Progress Notes (Signed)
[facility] PROGRESS NOTE  Allen KellMaurice W Suk ZOX:[id number] DOB: 08/27/1940 DOA: 06/23/2014 PCP: Lillia MountainGRIFFIN,JOHN JOSEPH, MD  Assessment/Plan: #1 acute respiratory failure Patient with use of accessory muscles of respiration, sitting upright complain of worsening shortness of breath. Exam with some bibasilar crackles. Will check a chest x-ray. Check a stat ABG. D-dimer is pending. Place on scheduled nebulizer treatments. Place on the BiPAP. Will transfer to stepdown unit. Consult with critical care medicine for further evaluation and management.  #2 acute on chronic combined systolic and diastolic heart failure Patient with complaints of worsening shortness of breath. Patient with signs of volume overload with JVD and bilateral lower extremity edema. Patient with shallow breath sounds. Pro BNP in the 500s. Cardiac enzymes minimally elevated.  Patient with recent 2-D echo done 04/22/2014 with a EF of 40-45%. Mildly dilated cavity size. Moderate LVH. Aortic valvular sclerosis. Moderate mitral valvular regurgitation. Moderately dilated left atrium. Moderately reduced right ventricular systolic function. Daily weights. Strict I's and O. patient is -1.59 L during this hospitalization. Continue Lasix 40 mg IV every 12 hours. Continue Coreg, Aldactone, Imdur, Lipitor. Monitor renal function. Cardiology following and appreciate input and recommendations.   #3 right obturator ring fracture/right sacral and left fractures/bilateral hip girdle muscular strain/Right hip pain Secondary to mechanical fall. Patient with complaints of pain. Continue pain management. Nonweightbearing on the right lower extremity. PT/OT. Consulted orthopedics for further evaluation and management.  #4 mechanical fall PT/OT.  #5 COPD Gold stage III Stable. No wheezing. Poor air movement. Check CXR, ABG. Continue home regimen of dulera. Place on scheduled nebs. D/C Spiriva. Nebulizers as needed.  #6 coronary artery disease status  post CABG/acute onchronic combined systolic and diastolic heart failure Patient seems as if likely an acute on chronic heart failure. Patient with complaints of worsening shortness of breath. Patient with positive JVD and improving lower extremity edema. Cardiac enzymes minimally elevated. Continue IV Lasix. Continue home regimen of Coreg, Aldactone, indoor, Lipitor. Cardiology ff.  #7 chronic kidney disease stage III Stable. Monitor closely with diuresis.  #8 tobacco abuse Tobacco cessation. Nicotine patch.  #9 hypertension Stable. Continue current regimen.  #10 ear lobe laceration Status post repair. ED physician.  #11 prophylaxis PPI for GI prophylaxis. Lovenox for DVT prophylaxis.  Code Status: Full Family Communication: Updated patient. No family at bedside.Tried to call wife, no answer. Disposition Plan: Transfer to SDU   Consultants:  Cardiology : Dr Gala RomneyBensimhon 06/24/14  Orthopedics pending  Procedures:  Plain films of the right hip 06/23/2014  MRI of the right hip 06/23/2014  CXR 06/25/14 pending  Antibiotics:  None  HPI/Subjective: Patient complaining of worsening shortness of breath. Patient also complaining of right hip pain.  Objective: Filed Vitals:   06/25/14 0538  BP: 115/76  Pulse: 77  Temp: 98.3 F (36.8 C)  Resp: 20    Intake/Output Summary (Last 24 hours) at 06/25/14 1142 Last data filed at 06/25/14 0600  Gross per 24 hour  Intake   1920 ml  Output   1525 ml  Net    395 ml   Filed Weights   06/23/14 1255 06/24/14 0500 06/25/14 0538  Weight: 84.369 kg (186 lb) 85.1 kg (187 lb 9.8 oz) 83.8 kg (184 lb 11.9 oz)    Exam:   General:  Sitting bolt upright, with use of accessory muscles of respiration. Thoracoabdominal breathing. Speaking in short sentences.  Cardiovascular: RRR. + JVD  Respiratory: Shallow breath sounds.Bibasilatr crackles. Some scattered coarse BS.Use of accessory muscle of respiration. Thoracoabdominal  breathing.  Abdomen: Soft/NT/ND/+BS  Musculoskeletal: No c/c. 1-2 + BLE edema  Data Reviewed: Basic Metabolic Panel:  Recent Labs Lab 06/23/14 1412 06/23/14 1413 06/24/14 0500 06/25/14 0201  NA  --  142 140 137  K  --  5.0 3.9 3.9  CL  --  105 102 100  CO2  --  30 33* 31  GLUCOSE  --  108* 102* 127*  BUN  --  35* 32* 34*  CREATININE  --  1.61* 1.90* 1.96*  CALCIUM  --  8.3* 8.1* 8.0*  MG 2.1  --   --   --    Liver Function Tests:  Recent Labs Lab 06/23/14 1413  AST 28  ALT 76*  ALKPHOS 53  BILITOT 0.7  PROT 5.5*  ALBUMIN 3.0*   No results for input(s): LIPASE, AMYLASE in the last 168 hours. No results for input(s): AMMONIA in the last 168 hours. CBC:  Recent Labs Lab 06/23/14 1413 06/24/14 0500 06/25/14 0201  WBC 11.2* 10.3 10.8*  NEUTROABS 9.9*  --  9.2*  HGB 12.4* 12.5* 12.5*  HCT 41.3 41.1 41.0  MCV 85.3 84.6 84.0  PLT 142* 147* 136*   Cardiac Enzymes:  Recent Labs Lab 06/24/14 1419 06/24/14 2006 06/25/14 0201  TROPONINI 0.06* 0.06* 0.05*   BNP (last 3 results)  Recent Labs  06/24/14 1420 06/25/14 0201  BNP 596.6* 539.7*    ProBNP (last 3 results)  Recent Labs  01/04/14 0450 04/21/14 1504  PROBNP 3786.0* 13092.0*    CBG: No results for input(s): GLUCAP in the last 168 hours.  No results found for this or any previous visit (from the past 240 hour(s)).   Studies: Ct Head Wo Contrast  06/23/2014   CLINICAL DATA:  Larey Seat about 8 hr ago now with injury to the right side of the head and ear.  EXAM: CT HEAD WITHOUT CONTRAST  TECHNIQUE: Contiguous axial images were obtained from the base of the skull through the vertex without intravenous contrast.  COMPARISON:  09/19/2011; 09/11/2011  FINDINGS: Similar findings of atrophy with diffuse sulcal prominence, centralized volume loss and mild commensurate expected dilatation of the ventricular system. Scattered periventricular hypodensities are unchanged compatible with microvascular ischemic  disease. The gray-white differentiation is otherwise well maintained without CT evidence of acute large territory infarct. No intraparenchymal or extra-axial mass or hemorrhage. Unchanged size and configuration of the ventricles and basilar cisterns. No midline shift. Intracranial atherosclerosis. Limited visualization of the paranasal sinuses and mastoid air cells is normal. No air-fluid levels. Regional soft tissues appear normal. Post left-sided cataract surgery.  IMPRESSION: Atrophy and microvascular ischemic disease without acute intracranial process.   Electronically Signed   By: Simonne Come M.D.   On: 06/23/2014 15:01   Mr Hip Right Wo Contrast  06/24/2014   CLINICAL DATA:  RIGHT hip pain.  Recent fall.  Occult fracture.  EXAM: MR OF THE RIGHT HIP WITHOUT CONTRAST  TECHNIQUE: Multiplanar, multisequence MR imaging was performed. No intravenous contrast was administered.  COMPARISON:  Radiographs 06/23/2014.  FINDINGS: Nondisplaced RIGHT obturator ring fractures are present. Fracture in the mid RIGHT inferior pubic ramus. There is also a fracture of the root of the RIGHT superior pubic ramus. Additionally, there is a complementary RIGHT sacral ala fracture. All of these fractures are nondisplaced and or radiographically occult. Anasarca is present with diffuse subcutaneous edema. LEFT gluteal muscular edema compatible with strain. There is also bilateral RIGHT-greater-than-LEFT adductor compartment strain and reactive edema. Gluteal tendons appear intact. Edema is present in the  RIGHT obturator internus and externus consistent with strain and reactive edema from adjacent fractures.  There is no proximal femur fracture. No hip effusion. Negative for bursitis. LEFT inguinal hernia contains a portion of the sigmoid colon.  IMPRESSION: 1. Nondisplaced RIGHT obturator ring fractures. Complementary nondisplaced RIGHT sacral ala fracture. 2. Bilateral hip girdle muscular strain. 3. Anasarca up. 4. LEFT inguinal  hernia containing a loop of sigmoid colon.   Electronically Signed   By: Andreas Newport M.D.   On: 06/24/2014 08:26   Dg Chest Port 1 View  06/24/2014   CLINICAL DATA:  Congestive heart failure. Coronary artery disease. COPD. Hypertension. Atrial flutter and fibrillation.  EXAM: PORTABLE CHEST - 1 VIEW  COMPARISON:  06/14/2014  FINDINGS: Moderate cardiomegaly is stable. Small pleural effusions are seen bilaterally which has decreased in size since previous study. Decreased atelectasis also noted in the right lung base. Prior CABG again noted.  IMPRESSION: Decreased small bilateral pleural effusions and right basilar atelectasis.  Stable cardiomegaly.   Electronically Signed   By: Myles Rosenthal M.D.   On: 06/24/2014 17:08   Dg Hip Unilat  With Pelvis 2-3 Views Right  06/23/2014   CLINICAL DATA:  Status post fall today while getting out of bed; right hip pain  EXAM: RIGHT HIP (WITH PELVIS) 2-3 VIEWS  COMPARISON:  Supine abdominal film of January 04, 2014 which includes a portion of the hips  FINDINGS: The bony pelvis is osteopenic. No acute pelvic fracture is demonstrated. The hip joint spaces are preserved. The observed portions of the sacrum are normal. There are common iliac stents bilaterally.  AP and frog-leg lateral views of the right hip reveal the joint space to be normal. The acetabulum is intact. The femoral head, neck, and intertrochanteric regions are normal in appearance.  IMPRESSION: There is no acute bony abnormality of the right hip.   Electronically Signed   By: David  [place]   On: 06/23/2014 14:05    Scheduled Meds: . aspirin  81 mg Oral Daily  . atorvastatin  10 mg Oral q1800  . buPROPion  300 mg Oral Daily  . carvedilol  12.5 mg Oral BID WC  . colchicine  0.6 mg Oral Daily  . docusate sodium  100 mg Oral BID  . enoxaparin (LOVENOX) injection  40 mg Subcutaneous Q24H  . furosemide  40 mg Intravenous BID  . ipratropium-albuterol  3 mL Nebulization Q4H  . isosorbide mononitrate  15 mg  Oral Daily  . mometasone-formoterol  2 puff Inhalation BID  . nicotine  21 mg Transdermal Daily  . pantoprazole  40 mg Oral Daily  . potassium chloride  20 mEq Oral BID  . sodium chloride  3 mL Intravenous Q12H  . spironolactone  25 mg Oral Daily   Continuous Infusions:   Principal Problem:   Acute respiratory distress Active Problems:   Acute on chronic systolic CHF (congestive heart failure)   COPD (chronic obstructive pulmonary disease), gold stage C.   Hyperlipidemia   S/P CABG (coronary artery bypass graft)   Chronic kidney disease (CKD), stage III (moderate)   Tobacco use disorder   PVD (peripheral vascular disease)   Essential hypertension   PAF (paroxysmal atrial fibrillation)   CAD (coronary artery disease)   Chronic combined systolic and diastolic CHF (congestive heart failure)   Anemia   Hypertensive heart disease   Right hip pain   Fall   Ear lobe laceration   Hip pain, acute   Acute hip pain  Dislocation of hip, obturator, right, closed   Sacral fracture, closed    Time spent:     [facility] MD  [facility] Pager [phone]. If 7PM-7AM, please contact night-coverage at [email or web address], password [facility] 06/25/2014, 11:42 AM  LOS: 1 day

## 2014-06-25 NOTE — Consult Note (Signed)
Reason for Consult: Right hip pain Referring Physician: Dr. [name] is an 74 y.o. male.  HPI: Nathan [name] is a 74 year old minimal household ambulator who sustained a mechanical fall prior to admission. Reports right hip pain. Currently admitted for medical management regarding his multiple medical problems including copd.. Communication with him currently is difficult because is in the stepdown unit and has some type of rebreather mask in position. He reports being fairly minimally active when he was at home. He would get out and get the paper but that was his maximal activity effort during the day. He denies any other orthopedic complaints.  Past Medical History  Diagnosis Date  . Aorto-iliac disease     a. s/p bilat with stent 2007 - followed by [facility].  . CAD (coronary artery disease)     a. 10/2011 s/p CABG x4 (LIMA-LAD, SVG-Diag, SVG-OM1 and SVG-PDA)  b. 03/2012 NSTEMI s/p LHC SVG-RCA and LIMA-LAD but total occlusion of SVG-OM as well as SVG-diag. RX therapy recommended   . Hypertension   . ED (erectile dysfunction)   . COPD, severe   . Hyperlipidemia   . Stroke   . GERD (gastroesophageal reflux disease)   . Gout   . Paroxysmal atrial flutter     ablation  . Chronic combined systolic and diastolic CHF (congestive heart failure)     a. Last echo 04/2014: EF 40-45%.  Marland Kitchen PAF (paroxysmal atrial fibrillation)   . CKD (chronic kidney disease), stage III   . Tobacco abuse   . History of noncompliance with medical treatment   . Carotid artery disease     a. RCEA 2013 - followed by [facility].  . Anemia   . Lower GI bleed     a. LGIB 10/2012: Colonoscopy performed June 21 showed blood in the colon but no definite source identified, diverticular versus AVM suspected. Aspirin stopped.  . Mitral regurgitation   . Hypertensive heart disease     Past Surgical History  Procedure Laterality Date  . Spinal fusion    . Hand surgery    . Neck fusion  1975  . Eye surgery    .  Coronary artery bypass graft  10/31/2011    Procedure: CORONARY ARTERY BYPASS GRAFTING (CABG);  Surgeon: Grace Isaac, MD;  Location: [place];  Service: Open Heart Surgery;  Laterality: N/A;  times three using  Left Internal Mammary Artery and Right Greater Saphenous Vein Graft harvested endoscopically; TEE  . Endarterectomy  10/31/2011    Procedure: ENDARTERECTOMY CAROTID;  Surgeon: Serafina Mitchell, MD;  Location: [facility] OR;  Service: Vascular;  Laterality: Right;  with patch angioplasty   . Carotid endarterectomy  10-31-2011    right  . Colonoscopy N/A 10/30/2012    Procedure: COLONOSCOPY;  Surgeon: Lear Ng, MD;  Location: [facility] ENDOSCOPY;  Service: Endoscopy;  Laterality: N/A;  . Left heart catheterization with coronary angiogram N/A 09/04/2011    Procedure: LEFT HEART CATHETERIZATION WITH CORONARY ANGIOGRAM;  Surgeon: Sinclair Grooms, MD;  Location: [facility] CATH LAB;  Service: Cardiovascular;  Laterality: N/A;  . Left heart cath N/A 04/03/2012    Procedure: LEFT HEART CATH;  Surgeon: Troy Sine, MD;  Location: [facility] CATH LAB;  Service: Cardiovascular;  Laterality: N/A;    Family History  Problem Relation Age of Onset  . Cancer Mother   . Tuberculosis Mother   . Heart disease Father     He did NOT have it before age 22  Social History:  reports that he has been smoking Cigarettes.  He has a 64 pack-year smoking history. He has never used smokeless tobacco. He reports that he drinks about 3.6 oz of alcohol per week. He reports that he does not use illicit drugs.  Allergies: No Known Allergies  Medications: I have reviewed the patient's current medications.  Results for orders placed or performed during the hospital encounter of 06/23/14 (from the past 48 hour(s))  Basic metabolic panel     Status: Abnormal   Collection Time: 06/24/14  5:00 AM  Result Value Ref Range   Sodium 140 135 - 145 mmol/L   Potassium 3.9 3.5 - 5.1 mmol/L    Comment: DELTA CHECK NOTED REPEATED TO  VERIFY    Chloride 102 96 - 112 mmol/L   CO2 33 (H) 19 - 32 mmol/L   Glucose, Bld 102 (H) 70 - 99 mg/dL   BUN 32 (H) 6 - 23 mg/dL   Creatinine, Ser 1.90 (H) 0.50 - 1.35 mg/dL   Calcium 8.1 (L) 8.4 - 10.5 mg/dL   GFR calc non Af Amer 33 (L) >90 mL/min   GFR calc Af Amer 38 (L) >90 mL/min    Comment: (NOTE) The eGFR has been calculated using the CKD EPI equation. This calculation has not been validated in all clinical situations. eGFR's persistently <90 mL/min signify possible Chronic Kidney Disease.    Anion gap 5 5 - 15  CBC     Status: Abnormal   Collection Time: 06/24/14  5:00 AM  Result Value Ref Range   WBC 10.3 4.0 - 10.5 K/uL   RBC 4.86 4.22 - 5.81 MIL/uL   Hemoglobin 12.5 (L) 13.0 - 17.0 g/dL   HCT 41.1 39.0 - 52.0 %   MCV 84.6 78.0 - 100.0 fL   MCH 25.7 (L) 26.0 - 34.0 pg   MCHC 30.4 30.0 - 36.0 g/dL   RDW 17.3 (H) 11.5 - 15.5 %   Platelets 147 (L) 150 - 400 K/uL  Protime-INR     Status: Abnormal   Collection Time: 06/24/14  5:00 AM  Result Value Ref Range   Prothrombin Time 16.5 (H) 11.6 - 15.2 seconds   INR 1.32 0.00 - 1.49  Troponin I (q 6hr x 3)     Status: Abnormal   Collection Time: 06/24/14  2:19 PM  Result Value Ref Range   Troponin I 0.06 (H) <0.031 ng/mL    Comment:        PERSISTENTLY INCREASED TROPONIN VALUES IN THE RANGE OF 0.04-0.49 ng/mL CAN BE SEEN IN:       -UNSTABLE ANGINA       -CONGESTIVE HEART FAILURE       -MYOCARDITIS       -CHEST TRAUMA       -ARRYHTHMIAS       -LATE PRESENTING MYOCARDIAL INFARCTION       -COPD   CLINICAL FOLLOW-UP RECOMMENDED.   Brain natriuretic peptide     Status: Abnormal   Collection Time: 06/24/14  2:20 PM  Result Value Ref Range   B Natriuretic Peptide 596.6 (H) 0.0 - 100.0 pg/mL  Troponin I (q 6hr x 3)     Status: Abnormal   Collection Time: 06/24/14  8:06 PM  Result Value Ref Range   Troponin I 0.06 (H) <0.031 ng/mL    Comment:        PERSISTENTLY INCREASED TROPONIN VALUES IN THE RANGE OF  0.04-0.49 ng/mL CAN BE SEEN IN:       -  UNSTABLE ANGINA       -CONGESTIVE HEART FAILURE       -MYOCARDITIS       -CHEST TRAUMA       -ARRYHTHMIAS       -LATE PRESENTING MYOCARDIAL INFARCTION       -COPD   CLINICAL FOLLOW-UP RECOMMENDED.   Troponin I (q 6hr x 3)     Status: Abnormal   Collection Time: 06/25/14  2:01 AM  Result Value Ref Range   Troponin I 0.05 (H) <0.031 ng/mL    Comment:        PERSISTENTLY INCREASED TROPONIN VALUES IN THE RANGE OF 0.04-0.49 ng/mL CAN BE SEEN IN:       -UNSTABLE ANGINA       -CONGESTIVE HEART FAILURE       -MYOCARDITIS       -CHEST TRAUMA       -ARRYHTHMIAS       -LATE PRESENTING MYOCARDIAL INFARCTION       -COPD   CLINICAL FOLLOW-UP RECOMMENDED.   Basic metabolic panel     Status: Abnormal   Collection Time: 06/25/14  2:01 AM  Result Value Ref Range   Sodium 137 135 - 145 mmol/L   Potassium 3.9 3.5 - 5.1 mmol/L   Chloride 100 96 - 112 mmol/L   CO2 31 19 - 32 mmol/L   Glucose, Bld 127 (H) 70 - 99 mg/dL   BUN 34 (H) 6 - 23 mg/dL   Creatinine, Ser 1.96 (H) 0.50 - 1.35 mg/dL   Calcium 8.0 (L) 8.4 - 10.5 mg/dL   GFR calc non Af Amer 32 (L) >90 mL/min   GFR calc Af Amer 37 (L) >90 mL/min    Comment: (NOTE) The eGFR has been calculated using the CKD EPI equation. This calculation has not been validated in all clinical situations. eGFR's persistently <90 mL/min signify possible Chronic Kidney Disease.    Anion gap 6 5 - 15  CBC with Differential/Platelet     Status: Abnormal   Collection Time: 06/25/14  2:01 AM  Result Value Ref Range   WBC 10.8 (H) 4.0 - 10.5 K/uL   RBC 4.88 4.22 - 5.81 MIL/uL   Hemoglobin 12.5 (L) 13.0 - 17.0 g/dL   HCT 41.0 39.0 - 52.0 %   MCV 84.0 78.0 - 100.0 fL   MCH 25.6 (L) 26.0 - 34.0 pg   MCHC 30.5 30.0 - 36.0 g/dL   RDW 17.3 (H) 11.5 - 15.5 %   Platelets 136 (L) 150 - 400 K/uL   Neutrophils Relative % 85 (H) 43 - 77 %   Neutro Abs 9.2 (H) 1.7 - 7.7 K/uL   Lymphocytes Relative 7 (L) 12 - 46 %   Lymphs  Abs 0.7 0.7 - 4.0 K/uL   Monocytes Relative 7 3 - 12 %   Monocytes Absolute 0.7 0.1 - 1.0 K/uL   Eosinophils Relative 1 0 - 5 %   Eosinophils Absolute 0.1 0.0 - 0.7 K/uL   Basophils Relative 0 0 - 1 %   Basophils Absolute 0.0 0.0 - 0.1 K/uL  Brain natriuretic peptide     Status: Abnormal   Collection Time: 06/25/14  2:01 AM  Result Value Ref Range   B Natriuretic Peptide 539.7 (H) 0.0 - 100.0 pg/mL  D-dimer, quantitative     Status: Abnormal   Collection Time: 06/25/14 11:04 AM  Result Value Ref Range   D-Dimer, Quant 8.68 (H) 0.00 - 0.48 ug/mL-FEU    Comment:  AT THE INHOUSE ESTABLISHED CUTOFF VALUE OF 0.48 ug/mL FEU, THIS ASSAY HAS BEEN DOCUMENTED IN THE LITERATURE TO HAVE A SENSITIVITY AND NEGATIVE PREDICTIVE VALUE OF AT LEAST 98 TO 99%.  THE TEST RESULT SHOULD BE CORRELATED WITH AN ASSESSMENT OF THE CLINICAL PROBABILITY OF DVT / VTE.   Blood gas, arterial     Status: Abnormal   Collection Time: 06/25/14 11:45 AM  Result Value Ref Range   O2 Content 1.0 L/min   Delivery systems NASAL CANNULA    pH, Arterial 7.424 7.350 - 7.450   pCO2 arterial 43.7 35.0 - 45.0 mmHg   pO2, Arterial BELOW REPORTABLE RANGE. 80.0 - 100.0 mmHg    Comment: CRITICAL RESULT CALLED TO, READ BACK BY AND VERIFIED WITH: DR.THOMPSON,MD AT 1158 BY SHERRY REEKES,RRT,RCP ON 06/25/14    Bicarbonate 28.1 (H) 20.0 - 24.0 mEq/L   TCO2 24.5 0 - 100 mmol/L   Acid-Base Excess 3.6 (H) 0.0 - 2.0 mmol/L   O2 Saturation 71.7 %   Patient temperature 37.0    Collection site LEFT BRACHIAL    Drawn by [id number]    Sample type ARTERIAL DRAW     Mr Hip Right Wo Contrast  06/24/2014   CLINICAL DATA:  RIGHT hip pain.  Recent fall.  Occult fracture.  EXAM: MR OF THE RIGHT HIP WITHOUT CONTRAST  TECHNIQUE: Multiplanar, multisequence MR imaging was performed. No intravenous contrast was administered.  COMPARISON:  Radiographs 06/23/2014.  FINDINGS: Nondisplaced RIGHT obturator ring fractures are present. Fracture in  the mid RIGHT inferior pubic ramus. There is also a fracture of the root of the RIGHT superior pubic ramus. Additionally, there is a complementary RIGHT sacral ala fracture. All of these fractures are nondisplaced and or radiographically occult. Anasarca is present with diffuse subcutaneous edema. LEFT gluteal muscular edema compatible with strain. There is also bilateral RIGHT-greater-than-LEFT adductor compartment strain and reactive edema. Gluteal tendons appear intact. Edema is present in the RIGHT obturator internus and externus consistent with strain and reactive edema from adjacent fractures.  There is no proximal femur fracture. No hip effusion. Negative for bursitis. LEFT inguinal hernia contains a portion of the sigmoid colon.  IMPRESSION: 1. Nondisplaced RIGHT obturator ring fractures. Complementary nondisplaced RIGHT sacral ala fracture. 2. Bilateral hip girdle muscular strain. 3. Anasarca up. 4. LEFT inguinal hernia containing a loop of sigmoid colon.   Electronically Signed   By: Dereck Ligas M.D.   On: 06/24/2014 08:26   Dg Chest Port 1 View  06/25/2014   CLINICAL DATA:  Patient with shortness of breath. History of coronary artery disease and hypertension.  EXAM: PORTABLE CHEST - 1 VIEW  COMPARISON:  06/24/2014  FINDINGS: Multiple monitoring leads overlie the patient. The left costophrenic angle is excluded from view. Stable enlarged cardiac and mediastinal contours status post median sternotomy and CABG procedure. Bilateral mid and lower lung heterogeneous pulmonary opacities, left greater than right. Small to moderate right pleural effusion. Probable layering left pleural effusion, although the left lung base is excluded from view. No definite pneumothorax.  IMPRESSION: Bilateral mid to lower lung heterogeneous opacities, left greater than right, with associated moderate right and probable small left pleural effusions. Findings may represent pulmonary edema, infection or underlying  atelectasis.   Electronically Signed   By: Lovey Newcomer M.D.   On: 06/25/2014 12:20   Dg Chest Port 1 View  06/24/2014   CLINICAL DATA:  Congestive heart failure. Coronary artery disease. COPD. Hypertension. Atrial flutter and fibrillation.  EXAM: PORTABLE CHEST - 1  VIEW  COMPARISON:  06/14/2014  FINDINGS: Moderate cardiomegaly is stable. Small pleural effusions are seen bilaterally which has decreased in size since previous study. Decreased atelectasis also noted in the right lung base. Prior CABG again noted.  IMPRESSION: Decreased small bilateral pleural effusions and right basilar atelectasis.  Stable cardiomegaly.   Electronically Signed   By: Earle Gell M.D.   On: 06/24/2014 17:08    Review of Systems  Unable to perform ROS  Blood pressure 122/51, pulse 84, temperature 98.1 F (36.7 C), temperature source Oral, resp. rate 23, height '6\' 1"'  (1.854 m), weight 84.7 kg (186 lb 11.7 oz), SpO2 94 %. Physical Exam  Constitutional: He appears well-developed.  HENT:  Head: Normocephalic.  Eyes: Pupils are equal, round, and reactive to light.  Neck: Normal range of motion.  Cardiovascular: Normal rate.   Neurological: He is alert.  Skin: Skin is warm.  Psychiatric: He has a normal mood and affect.   patient currently has somewhat labored respiratory effort He has pitting edema bilateral lower Ebony Hail is been ankle dorsi and plantar flexion is intact foot is perfused and sensate on the right left-hand side does have pain with range of motion right hip no pain with range of motion left hip is no crepitus or effusion or swelling in the bilateral ankle knee region. Does have an abrasion of the right elbow but good range of motion of bilateral wrist bilateral elbows without crepitus bilateral shoulders without crepitus  Assessment/Plan: Impression is nondisplaced right-sided pelvic ring disruption plan is for therapy evaluation nonweightbearing right lower extremity okay to touch touchdown  weightbearing left lower extremity has some muscle strain on the sides of that should be a self-limited problem he'll need follow-up with me in 3 weeks for repeat radiographs make sure everything is healing  Nathan [name] [name] 06/25/2014, 3:05 PM

## 2014-06-25 NOTE — Progress Notes (Signed)
xrays reviewed nwb right side In surgery will see after case

## 2014-06-25 NOTE — Progress Notes (Signed)
ANTICOAGULATION CONSULT NOTE - Initial Consult  Pharmacy Consult for Heparin Indication: Rule out PE  No Known Allergies  Patient Measurements: Height:  (177.8 cm) Weight: 184 lb 11.9 oz (83.8 kg) IBW/kg (Calculated) : 73 Heparin Dosing Weight: 83.8 kg  Vital Signs: Temp: 98.3 F (36.8 C) (02/14 0538) Temp Source: Oral (02/14 0538) BP: 115/76 mmHg (02/14 0538) Pulse Rate: 77 (02/14 0538)  Labs:  Recent Labs  06/23/14 1413 06/24/14 0500 06/24/14 1419 06/24/14 2006 06/25/14 0201  HGB 12.4* 12.5*  --   --  12.5*  HCT 41.3 41.1  --   --  41.0  PLT 142* 147*  --   --  136*  LABPROT  --  16.5*  --   --   --   INR  --  1.32  --   --   --   CREATININE 1.61* 1.90*  --   --  1.96*  TROPONINI  --   --  0.06* 0.06* 0.05*    Estimated Creatinine Clearance: 33.6 mL/min (by C-G formula based on Cr of 1.96).   Medical History: Past Medical History  Diagnosis Date  . Aorto-iliac disease     a. s/p bilat with stent 2007 - followed by [facility].  . CAD (coronary artery disease)     a. 10/2011 s/p CABG x4 (LIMA-LAD, SVG-Diag, SVG-OM1 and SVG-PDA)  b. 03/2012 NSTEMI s/p LHC SVG-RCA and LIMA-LAD but total occlusion of SVG-OM as well as SVG-diag. RX therapy recommended   . Hypertension   . ED (erectile dysfunction)   . COPD, severe   . Hyperlipidemia   . Stroke   . GERD (gastroesophageal reflux disease)   . Gout   . Paroxysmal atrial flutter     ablation  . Chronic combined systolic and diastolic CHF (congestive heart failure)     a. Last echo 04/2014: EF 40-45%.  Marland Kitchen PAF (paroxysmal atrial fibrillation)   . CKD (chronic kidney disease), stage III   . Tobacco abuse   . History of noncompliance with medical treatment   . Carotid artery disease     a. RCEA 2013 - followed by [facility].  . Anemia   . Lower GI bleed     a. LGIB 10/2012: Colonoscopy performed June 21 showed blood in the colon but no definite source identified, diverticular versus AVM suspected. Aspirin stopped.  .  Mitral regurgitation   . Hypertensive heart disease     Medications:  Scheduled:  . aspirin  81 mg Oral Daily  . atorvastatin  10 mg Oral q1800  . buPROPion  300 mg Oral Daily  . carvedilol  12.5 mg Oral BID WC  . colchicine  0.6 mg Oral Daily  . docusate sodium  100 mg Oral BID  . enoxaparin (LOVENOX) injection  40 mg Subcutaneous Q24H  . furosemide  40 mg Intravenous BID  . ipratropium-albuterol  3 mL Nebulization Q4H  . isosorbide mononitrate  15 mg Oral Daily  . mometasone-formoterol  2 puff Inhalation BID  . nicotine  21 mg Transdermal Daily  . pantoprazole  40 mg Oral Daily  . potassium chloride  20 mEq Oral BID  . sodium chloride  3 mL Intravenous Q12H  . spironolactone  25 mg Oral Daily   Infusions:   PRN: sodium chloride, acetaminophen **OR** acetaminophen, albuterol, alum & mag hydroxide-simeth, ipratropium, morphine injection, nitroGLYCERIN, ondansetron **OR** ondansetron (ZOFRAN) IV, oxyCODONE, senna-docusate, sodium chloride, sorbitol  Assessment: 74 yo male admitted 06/23/14 s/p fall. He was recently admitted 2/3-06/18/14 for  an acute COPD exacerbation. On 2/14, he developed acute respiratory distress, pO2 below reportable range and elevated ddimer. Per MD, plan for VQ scan, LE dopplers, and Pharmacy consulted to dose IV heparin for rule out PE.  Patient has been on Lovenox 40mg  sq q24h, most recent dose was 2/13 at ~22:00. MD aware of low baseline platelet count of 126k; noted to be 214k in December 2015.  Goal of Therapy:  Heparin level 0.3-0.7 units/ml Monitor platelets by anticoagulation protocol: Yes   Plan:   Heparin 2000 units IV bolus x 1  Heparin 1300 units/hr IV infusion  Check heparin level in 8 hours  Daily heparin level, CBC  Loralee PacasErin Pearline Yerby, PharmD, BCPS Pager: [phone] 06/25/2014,12:42 PM

## 2014-06-25 NOTE — Progress Notes (Signed)
PHARMACY - HEPARIN (brief note)  IV heparin infusing @ 1300 units/hr for r/o PE  Initial heparin level = 0.63 (goal 0.3-0.7) No complications of therapy noted  Assessment:  Heparin level therapeutic at current rate  Plan:  Continue IV Heparin @ 1300 units/hr            Check Heparin level again with AM labs to confirm therapeutic dose  Terrilee FilesLeann Rushawn Capshaw, PharmD

## 2014-06-25 NOTE — Progress Notes (Signed)
Pt removed from BIPAP per Pt request.  Pt placed on 4 LPM [place] and tolerating well at this time.  Pt in no noted respiratory distress at this time, vitals are WNL.  RT will leave Pt off BIPAP for now, RT to monitor and assess as needed.

## 2014-06-25 NOTE — Progress Notes (Signed)
PT Cancellation Note  Patient Details Name: Allen KellMaurice W Eatherly MRN: [id number] DOB: 12/01/1940   Cancelled Treatment:    Reason Eval/Treat Not Completed: Patient not medically ready; checked pt again, transferring to SDU at this time per RN, ortho consult pending;will attempt  again next day or as schedule permits;   [facility] LLCWILLIAMS,Alexcis Bicking 06/25/2014, 12:32 PM

## 2014-06-25 NOTE — Progress Notes (Addendum)
Subjective:  Complains of shortness of breath.  He was calm when I entered the room but then began to breathe rapidly taking shallow breaths and was hyperventilating.  Good diuresis overnight.  He did have a recent fall.  His weight is down today.  Objective:  Vital Signs in the last 24 hours: BP 115/76 mmHg  Pulse 77  Temp(Src) 98.3 F (36.8 C) (Oral)  Resp 20  Ht 5\' 10"  (1.778 m)  Wt 83.8 kg (184 lb 11.9 oz)  BMI 26.51 kg/m2  SpO2 93%  Physical Exam: Bearded white male who is hyperventilating Lungs:  Reduced breath sounds  Cardiac:  Regular rhythm, normal S1 and S2, no S3 Extremities: 1+ edema present  Intake/Output from previous day: 02/13 0701 - 02/14 0700 In: 2280 [P.O.:2280] Out: 1875 [Urine:1875]  Weight Filed Weights   06/23/14 1255 06/24/14 0500 06/25/14 0538  Weight: 84.369 kg (186 lb) 85.1 kg (187 lb 9.8 oz) 83.8 kg (184 lb 11.9 oz)    Lab Results: Basic Metabolic Panel:  Recent Labs  46/96/2902/13/16 0500 06/25/14 0201  NA 140 137  K 3.9 3.9  CL 102 100  CO2 33* 31  GLUCOSE 102* 127*  BUN 32* 34*  CREATININE 1.90* 1.96*   CBC:  Recent Labs  06/23/14 1413 06/24/14 0500 06/25/14 0201  WBC 11.2* 10.3 10.8*  NEUTROABS 9.9*  --  9.2*  HGB 12.4* 12.5* 12.5*  HCT 41.3 41.1 41.0  MCV 85.3 84.6 84.0  PLT 142* 147* 136*   Cardiac Panel (last 3 results)  Recent Labs  06/24/14 1419 06/24/14 2006 06/25/14 0201  TROPONINI 0.06* 0.06* 0.05*    Telemetry: Sinus rhythm  Assessment/Plan:  1.  Acute dyspnea likely congestive heart failure but with recent fall pulmonary embolus is also a consideration. 2.  COPD 3.  Coronary artery disease with previous bypass grafting and occluded grafts 4.  Stage III chronic kidney disease  Recommendations:  BNP level was not that high.  He is diuresing adequately.  I will check a d-dimer but he may need to be assessed for pulmonary embolus if it is up.      Darden PalmerW. Spencer Tilley, Jr.  MD  [facility]  06/25/2014, 9:53 AM

## 2014-06-26 ENCOUNTER — Inpatient Hospital Stay (HOSPITAL_COMMUNITY): Payer: Medicare PPO

## 2014-06-26 DIAGNOSIS — I25119 Atherosclerotic heart disease of native coronary artery with unspecified angina pectoris: Secondary | ICD-10-CM

## 2014-06-26 DIAGNOSIS — R0600 Dyspnea, unspecified: Secondary | ICD-10-CM

## 2014-06-26 DIAGNOSIS — S3210XS Unspecified fracture of sacrum, sequela: Secondary | ICD-10-CM

## 2014-06-26 DIAGNOSIS — J80 Acute respiratory distress syndrome: Secondary | ICD-10-CM

## 2014-06-26 DIAGNOSIS — R0602 Shortness of breath: Secondary | ICD-10-CM

## 2014-06-26 DIAGNOSIS — S73024D Obturator dislocation of right hip, subsequent encounter: Secondary | ICD-10-CM

## 2014-06-26 LAB — CBC WITH DIFFERENTIAL/PLATELET
BASOS ABS: 0 10*3/uL (ref 0.0–0.1)
BASOS PCT: 0 % (ref 0–1)
Eosinophils Absolute: 0 10*3/uL (ref 0.0–0.7)
Eosinophils Relative: 0 % (ref 0–5)
HEMATOCRIT: 40.2 % (ref 39.0–52.0)
HEMOGLOBIN: 12.5 g/dL — AB (ref 13.0–17.0)
LYMPHS ABS: 0.5 10*3/uL — AB (ref 0.7–4.0)
Lymphocytes Relative: 3 % — ABNORMAL LOW (ref 12–46)
MCH: 25.7 pg — ABNORMAL LOW (ref 26.0–34.0)
MCHC: 31.1 g/dL (ref 30.0–36.0)
MCV: 82.7 fL (ref 78.0–100.0)
MONO ABS: 0.7 10*3/uL (ref 0.1–1.0)
MONOS PCT: 4 % (ref 3–12)
Neutro Abs: 14.8 10*3/uL — ABNORMAL HIGH (ref 1.7–7.7)
Neutrophils Relative %: 92 % — ABNORMAL HIGH (ref 43–77)
Platelets: 101 10*3/uL — ABNORMAL LOW (ref 150–400)
RBC: 4.86 MIL/uL (ref 4.22–5.81)
RDW: 17.2 % — AB (ref 11.5–15.5)
WBC: 16 10*3/uL — ABNORMAL HIGH (ref 4.0–10.5)

## 2014-06-26 LAB — BASIC METABOLIC PANEL
Anion gap: 7 (ref 5–15)
BUN: 41 mg/dL — AB (ref 6–23)
CALCIUM: 7.8 mg/dL — AB (ref 8.4–10.5)
CO2: 30 mmol/L (ref 19–32)
Chloride: 96 mmol/L (ref 96–112)
Creatinine, Ser: 2.11 mg/dL — ABNORMAL HIGH (ref 0.50–1.35)
GFR calc Af Amer: 34 mL/min — ABNORMAL LOW (ref >90)
GFR calc non Af Amer: 29 mL/min — ABNORMAL LOW (ref >90)
Glucose, Bld: 115 mg/dL — ABNORMAL HIGH (ref 70–99)
POTASSIUM: 4.6 mmol/L (ref 3.5–5.1)
Sodium: 133 mmol/L — ABNORMAL LOW (ref 135–145)

## 2014-06-26 LAB — URINALYSIS, ROUTINE W REFLEX MICROSCOPIC
Bilirubin Urine: NEGATIVE
GLUCOSE, UA: NEGATIVE mg/dL
Ketones, ur: NEGATIVE mg/dL
Leukocytes, UA: NEGATIVE
Nitrite: NEGATIVE
PH: 5 (ref 5.0–8.0)
Protein, ur: NEGATIVE mg/dL
Specific Gravity, Urine: 1.016 (ref 1.005–1.030)
Urobilinogen, UA: 0.2 mg/dL (ref 0.0–1.0)

## 2014-06-26 LAB — URINE MICROSCOPIC-ADD ON

## 2014-06-26 LAB — HEPARIN LEVEL (UNFRACTIONATED): Heparin Unfractionated: 0.46 IU/mL (ref 0.30–0.70)

## 2014-06-26 LAB — BRAIN NATRIURETIC PEPTIDE: B Natriuretic Peptide: 626.8 pg/mL — ABNORMAL HIGH (ref 0.0–100.0)

## 2014-06-26 MED ORDER — TECHNETIUM TC 99M DIETHYLENETRIAME-PENTAACETIC ACID: 50.0000 | Freq: Once | INTRAVENOUS | Status: AC | PRN

## 2014-06-26 MED ORDER — TECHNETIUM TO 99M ALBUMIN AGGREGATED
5.5000 | Freq: Once | INTRAVENOUS | Status: AC | PRN
  Administered 2014-06-26: 6 via INTRAVENOUS

## 2014-06-26 NOTE — Procedures (Signed)
Pt refuses bipap at this time

## 2014-06-26 NOTE — Progress Notes (Signed)
PT Cancellation Note  Patient Details Name: Nathan [name] MRN: [id number] DOB: 08/04/1940   Cancelled Treatment:    Reason Eval/Treat Not Completed: Medical issues which prohibited therapy (for VQ scan and dopplers. will check back this PM for  results and medical  clearance as  schedule allows.)   Nathan [name], Nathan [name] 06/26/2014, 9:28 AM Blanchard KelchKaren Bertran Zeimet PT [phone]

## 2014-06-26 NOTE — Progress Notes (Signed)
  Echocardiogram 2D Echocardiogram has been performed.  Delcie RochENNINGTON, Kaylei Frink 06/26/2014, 2:26 PM

## 2014-06-26 NOTE — Progress Notes (Signed)
ANTICOAGULATION CONSULT NOTE - Follow Up Consult  Pharmacy Consult for Heparin Indication: r/o pulmonary embolus  No Known Allergies  Patient Measurements: Height: 6\' 1"  (185.4 cm) Weight: 186 lb 11.7 oz (84.7 kg) IBW/kg (Calculated) : 79.9 Heparin Dosing Weight: actual weight  Vital Signs: Temp: 97.8 F (36.6 C) (02/15 0412) Temp Source: Oral (02/15 0000) BP: 111/57 mmHg (02/15 0600) Pulse Rate: 85 (02/15 0600)  Labs:  Recent Labs  06/24/14 0500 06/24/14 1419 06/24/14 2006 06/25/14 0201 06/25/14 2100 06/26/14 0357  HGB 12.5*  --   --  12.5*  --  12.5*  HCT 41.1  --   --  41.0  --  40.2  PLT 147*  --   --  136*  --  101*  LABPROT 16.5*  --   --   --   --   --   INR 1.32  --   --   --   --   --   HEPARINUNFRC  --   --   --   --  0.63 0.46  CREATININE 1.90*  --   --  1.96*  --  2.11*  TROPONINI  --  0.06* 0.06* 0.05*  --   --     Estimated Creatinine Clearance: 34.2 mL/min (by C-G formula based on Cr of 2.11).   Medications:  Infusions:  . heparin 1,300 Units/hr (06/25/14 1344)    Assessment: 74 yo male admitted 06/23/14 s/p fall. He was recently admitted 2/3-06/18/14 for an acute COPD exacerbation. On 2/14, he developed acute respiratory distress, pO2 below reportable range and elevated Ddimer. Per MD, plan for VQ scan, LE dopplers.  Prophylactic Lovenox (last dose 2/13 ~2200) was d/c.  Plt noted to be chronically low.  Pharmacy consulted to dose IV heparin for rule out PE.  Today, 06/26/2014:  Heparin level 0.46 remains therapeutic on heparin at 13 ml/hr.  CBC: Hgb 12.5 (stable since admission), Plt 101 (chronically low and decreased)  Rn reports pt is oozing blood at IV site and from lacerations on ear and arm.  No prolonged active bleeding noted.  SCr 2.11 is increased, CrCl ~ 34 ml/min  Goal of Therapy:  Heparin level 0.3-0.7 units/ml Monitor platelets by anticoagulation protocol: Yes   Plan:   Continue heparin IV infusion at 1300 units/hr (13  ml/hr)  Daily heparin level and CBC  Continue to monitor H&H and platelets  Follow up results of VQ scan when available.  Lynann Beaverhristine Ezio Wieck PharmD, BCPS Pager [phone] 06/26/2014 8:12 AM

## 2014-06-26 NOTE — Progress Notes (Signed)
Patient transferred from ICU to 1436, alert and oriented, C/O right hip pain aftermovement from bed to bed, PRN morphine given, oriented patient to room/unit and reviewed plan of care with patient. Stage II ulcer on sacrum and right buttock lesion ?? From fall at home, covered with alleyven. BLE edema and heparin drip running at 13cc/hr. Foley cath inplace draining clear amber color urine, will continue to assess patient. Will continue to F/U with plan of care.

## 2014-06-26 NOTE — Progress Notes (Signed)
OT Cancellation Note  Patient Details Name: Nathan [name] MRN: [id number] DOB: 09/23/1940   Cancelled Treatment:    Reason Eval/Treat Not Completed: Medical issues which prohibited therapy  Awaiting results of VQ scan ThanksLise Auer, Lori Shiquan Mathieu, OT [phone]  Einar CrowEDDING, Lorea Kupfer D 06/26/2014, 12:50 PM

## 2014-06-26 NOTE — Progress Notes (Signed)
[facility] PROGRESS NOTE  Nathan [name] BJY:[id number] DOB: [birth year] DOA: 06/23/2014 PCP: Lillia Mountain, MD  Assessment/Plan: #1 acute respiratory failure Questionable etiology. Questionable PE versus acute on chronic combined systolic and diastolic heart failure. Clinical improvement. Patient currently off BiPAP. Patient still volume overloaded on examination. D-dimer was elevated and a such VQ scan is pending. Lower extremity Dopplers pending. Patient on full dose heparin for now until VQ scan results are back. Chest x-ray with slight improvement in left lung consolidation,  persistent edema right pleural effusion.continue IV Lasix, scheduled nebs, chest PT, cardiac medications. Cardiology following and appreciate the input and recommendations.  #2 acute on chronic combined systolic and diastolic heart failure Patient with complaints of worsening shortness of breath, which has improved. Patient with signs of volume overload with JVD and bilateral lower extremity edema. Patient with improvement with his breathing. Patient currently off BiPAP and on room air speaking in full sentences. Pro BNP in the 500s. Cardiac enzymes minimally elevated.  Patient with recent 2-D echo done 04/22/2014 with a EF of 40-45%. Mildly dilated cavity size. Moderate LVH. Aortic valvular sclerosis. Moderate mitral valvular regurgitation. Moderately dilated left atrium. Moderately reduced right ventricular systolic function. Daily weights. Strict I's and O. patient is -4.1 L during this hospitalization. Repeat 2-D echo. Continue Lasix 60 mg IV every 12 hours. Continue Coreg, Aldactone, Imdur, Lipitor. Monitor renal function. Cardiology following and appreciate input and recommendations.   #3 right obturator ring fracture/right sacral and left fractures/bilateral hip girdle muscular strain/Right hip pain Secondary to mechanical fall. Patient with improvement in his pain. Continue pain management.  Nonweightbearing on the right lower extremity. PT/OT. Patient has been seen by Dr. August Saucer of orthopedics who is recommending touchdown weightbearing with conservative treatment and outpatient follow-up in 3 weeks with repeat x-rays.  #4 mechanical fall PT/OT.  #5 COPD Gold stage III Stable. No wheezing. Poor air movement. Continue home regimen of dulera. Continue scheduled nebs. Nebulizers as needed.  #6 coronary artery disease status post CABG/acute onchronic combined systolic and diastolic heart failure Patient seems as if likely an acute on chronic heart failure. Patient with clinical improvement.  Patient with positive JVD and improving lower extremity edema. Cardiac enzymes minimally elevated. Continue IV Lasix. Continue home regimen of Coreg, Aldactone, indoor, Lipitor. Cardiology ff.  #7 chronic kidney disease stage III Stable. Monitor closely with diuresis.  #8 leukocytosis Likely reactive leukocytosis. Patient with no significant symptoms that would be worrisome for pneumonia. Will check a UA with cultures and sensitivities. Check blood cultures 2. Follow for now.  #9 tobacco abuse Tobacco cessation. Nicotine patch.  #10 hypertension Stable. Continue current regimen.  #11 ear lobe laceration Status post repair. ED physician.  #12 prophylaxis PPI for GI prophylaxis. Lovenox for DVT prophylaxis.  Code Status: Full Family Communication: Updated patient. No family at bedside. Disposition Plan: Remain in SDU   Consultants:  Cardiology : Dr Gala Romney 06/24/14  Orthopedics: Dr August Saucer 06/25/14  Procedures:  Plain films of the right hip 06/23/2014  MRI of the right hip 06/23/2014  CXR 06/25/14 pending  Antibiotics:  None  HPI/Subjective: Patient states shortness of breath improved. Patient states improvement in right hip pain.  Objective: Filed Vitals:   06/26/14 0800  BP: 125/58  Pulse: 88  Temp: 97.5 F (36.4 C)  Resp: 20    Intake/Output Summary (Last 24  hours) at 06/26/14 0856 Last data filed at 06/26/14 0700  Gross per 24 hour  Intake 224.47 ml  Output   2760 ml  Net -2535.53 ml   Filed Weights   06/24/14 0500 06/25/14 0538 06/25/14 1244  Weight: 85.1 kg (187 lb 9.8 oz) 83.8 kg (184 lb 11.9 oz) 84.7 kg (186 lb 11.7 oz)    Exam:   General:  Sitting up on [place] in NAD.  Cardiovascular: RRR. + JVD  Respiratory: Fair air movement.Bibasilatr crackles. Some scattered coarse BS.  Abdomen: Soft/NT/ND/+BS  Musculoskeletal: No c/c. 2 + BLE edema  Data Reviewed: Basic Metabolic Panel:  Recent Labs Lab 06/23/14 1412 06/23/14 1413 06/24/14 0500 06/25/14 0201 06/26/14 0357  NA  --  142 140 137 133*  K  --  5.0 3.9 3.9 4.6  CL  --  105 102 100 96  CO2  --  30 33* 31 30  GLUCOSE  --  108* 102* 127* 115*  BUN  --  35* 32* 34* 41*  CREATININE  --  1.61* 1.90* 1.96* 2.11*  CALCIUM  --  8.3* 8.1* 8.0* 7.8*  MG 2.1  --   --   --   --    Liver Function Tests:  Recent Labs Lab 06/23/14 1413  AST 28  ALT 76*  ALKPHOS 53  BILITOT 0.7  PROT 5.5*  ALBUMIN 3.0*   No results for input(s): LIPASE, AMYLASE in the last 168 hours. No results for input(s): AMMONIA in the last 168 hours. CBC:  Recent Labs Lab 06/23/14 1413 06/24/14 0500 06/25/14 0201 06/26/14 0357  WBC 11.2* 10.3 10.8* 16.0*  NEUTROABS 9.9*  --  9.2* 14.8*  HGB 12.4* 12.5* 12.5* 12.5*  HCT 41.3 41.1 41.0 40.2  MCV 85.3 84.6 84.0 82.7  PLT 142* 147* 136* 101*   Cardiac Enzymes:  Recent Labs Lab 06/24/14 1419 06/24/14 2006 06/25/14 0201  TROPONINI 0.06* 0.06* 0.05*   BNP (last 3 results)  Recent Labs  06/24/14 1420 06/25/14 0201 06/26/14 0357  BNP 596.6* 539.7* 626.8*    ProBNP (last 3 results)  Recent Labs  01/04/14 0450 04/21/14 1504  PROBNP 3786.0* 13092.0*    CBG: No results for input(s): GLUCAP in the last 168 hours.  No results found for this or any previous visit (from the past 240 hour(s)).   Studies: Dg Chest Port 1  View  06/26/2014   CLINICAL DATA:  Dyspnea  EXAM: PORTABLE CHEST - 1 VIEW  COMPARISON:  06/25/2014  FINDINGS: There is unchanged cardiomegaly. There is improvement, with less confluent consolidation in the lateral left lung although ground-glass opacities persist bilaterally. There is no change in the moderate right pleural effusion.  IMPRESSION: Slight improvement in the left lung consolidation. Persistent edema and right pleural effusion.   Electronically Signed   By: Ellery Plunk M.D.   On: 06/26/2014 06:14   Dg Chest Port 1 View  06/25/2014   CLINICAL DATA:  Patient with shortness of breath. History of coronary artery disease and hypertension.  EXAM: PORTABLE CHEST - 1 VIEW  COMPARISON:  06/24/2014  FINDINGS: Multiple monitoring leads overlie the patient. The left costophrenic angle is excluded from view. Stable enlarged cardiac and mediastinal contours status post median sternotomy and CABG procedure. Bilateral mid and lower lung heterogeneous pulmonary opacities, left greater than right. Small to moderate right pleural effusion. Probable layering left pleural effusion, although the left lung base is excluded from view. No definite pneumothorax.  IMPRESSION: Bilateral mid to lower lung heterogeneous opacities, left greater than right, with associated moderate right and probable small left pleural effusions. Findings may represent pulmonary edema, infection or underlying atelectasis.  Electronically Signed   By: Annia Beltrew  Davis M.D.   On: 06/25/2014 12:20   Dg Chest Port 1 View  06/24/2014   CLINICAL DATA:  Congestive heart failure. Coronary artery disease. COPD. Hypertension. Atrial flutter and fibrillation.  EXAM: PORTABLE CHEST - 1 VIEW  COMPARISON:  06/14/2014  FINDINGS: Moderate cardiomegaly is stable. Small pleural effusions are seen bilaterally which has decreased in size since previous study. Decreased atelectasis also noted in the right lung base. Prior CABG again noted.  IMPRESSION: Decreased  small bilateral pleural effusions and right basilar atelectasis.  Stable cardiomegaly.   Electronically Signed   By: Myles RosenthalJohn  Stahl M.D.   On: 06/24/2014 17:08    Scheduled Meds: . aspirin  81 mg Oral Daily  . atorvastatin  10 mg Oral q1800  . buPROPion  300 mg Oral Daily  . carvedilol  12.5 mg Oral BID WC  . colchicine  0.6 mg Oral Daily  . docusate sodium  100 mg Oral BID  . furosemide  60 mg Intravenous BID  . ipratropium-albuterol  3 mL Nebulization Q4H  . isosorbide mononitrate  15 mg Oral Daily  . mometasone-formoterol  2 puff Inhalation BID  . nicotine  21 mg Transdermal Daily  . pantoprazole  40 mg Oral Daily  . potassium chloride  20 mEq Oral BID  . sodium chloride  3 mL Intravenous Q12H  . spironolactone  25 mg Oral Daily   Continuous Infusions: . heparin 1,300 Units/hr (06/25/14 1344)    Principal Problem:   Acute respiratory distress Active Problems:   Acute on chronic systolic CHF (congestive heart failure)   COPD (chronic obstructive pulmonary disease), gold stage C.   Hyperlipidemia   S/P CABG (coronary artery bypass graft)   Chronic kidney disease (CKD), stage III (moderate)   Tobacco use disorder   PVD (peripheral vascular disease)   Essential hypertension   PAF (paroxysmal atrial fibrillation)   CAD (coronary artery disease)   Chronic combined systolic and diastolic CHF (congestive heart failure)   Anemia   Hypertensive heart disease   Right hip pain   Fall   Ear lobe laceration   Hip pain, acute   Acute hip pain   Dislocation of hip, obturator, right, closed   Sacral fracture, closed    Time spent: 50 mins    [facility] Hospital For RehabHOMPSON,DANIEL MD  [facility] Pager [phone]. If 7PM-7AM, please contact night-coverage at [email or web address], password [email or web address] 06/26/2014, 8:56 AM  LOS: 2 days

## 2014-06-26 NOTE — Progress Notes (Signed)
Bilateral lower extremity venous duplex completed:  No evidence of DVT, superficial thrombosis, or Baker's cyst.   

## 2014-06-26 NOTE — Progress Notes (Signed)
PROGRESS NOTE  Subjective:   Nathan [name] is a 74 y.o. male with history of CAD s/p CABG, AFL s/p ablation (details unclear),PAF (last documented 04/2012 - not on [facility]), chronic combined heart failure, hypertension, severe COPD with ongoing tobacco use previously on home O2, CKD (baseline Cr 1.5-1.7), stroke, GERD, PAD (LE stenting 2005, R CEA 2013), and medication noncompliance.  He fell last week and suffered a pelvic fracture.  He has had progressive dyspnea and has had a markely elevated D-dimer.  VQ was ordered but not done yet.  He has been empirically treated with IV heparin.     Objective:    Vital Signs:   Temp:  [97.5 F (36.4 C)-98.3 F (36.8 C)] 97.5 F (36.4 C) (02/15 0800) Pulse Rate:  [79-96] 88 (02/15 0800) Resp:  [18-42] 20 (02/15 0800) BP: (97-135)/(44-58) 125/58 mmHg (02/15 0800) SpO2:  [87 %-100 %] 87 % (02/15 0843) FiO2 (%):  [55 %-60 %] 55 % (02/14 1950) Weight:  [186 lb 11.7 oz (84.7 kg)] 186 lb 11.7 oz (84.7 kg) (02/14 1244)  Last BM Date: 06/23/14 (per patient)   24-hour weight change: Weight change: 1 lb 15.7 oz (0.9 kg)  Weight trends: Filed Weights   06/24/14 0500 06/25/14 0538 06/25/14 1244  Weight: 187 lb 9.8 oz (85.1 kg) 184 lb 11.9 oz (83.8 kg) 186 lb 11.7 oz (84.7 kg)    Intake/Output:  02/14 0701 - 02/15 0700 In: 224.5 [I.V.:224.5] Out: 2760 [Urine:2760]     Physical Exam: BP 125/58 mmHg  Pulse 88  Temp(Src) 97.5 F (36.4 C) (Axillary)  Resp 20  Ht  (1.854 m)  Wt 186 lb 11.7 oz (84.7 kg)  BMI 24.64 kg/m2  SpO2 87%  Wt Readings from Last 3 Encounters:  06/25/14 186 lb 11.7 oz (84.7 kg)  06/23/14 186 lb (84.369 kg)  06/20/14 200 lb (90.719 kg)    General: Vital signs reviewed and noted.   Head: Normocephalic, atraumatic.  Eyes: conjunctivae/corneas clear.  EOM's intact.   Throat: normal  Neck:  normal   Lungs:     Rales, rhonchi  Heart:   RR   Abdomen:  Soft, non-tender, non-distended    Extremities:  2-3+ pitting edema .     Neurologic: A&O X3, CN II - XII are grossly intact.   Psych: Normal     Labs: BMET:  Recent Labs  06/23/14 1412  06/25/14 0201 06/26/14 0357  NA  --   < > 137 133*  K  --   < > 3.9 4.6  CL  --   < > 100 96  CO2  --   < > 31 30  GLUCOSE  --   < > 127* 115*  BUN  --   < > 34* 41*  CREATININE  --   < > 1.96* 2.11*  CALCIUM  --   < > 8.0* 7.8*  MG 2.1  --   --   --   < > = values in this interval not displayed.  Liver function tests:  Recent Labs  06/23/14 1413  AST 28  ALT 76*  ALKPHOS 53  BILITOT 0.7  PROT 5.5*  ALBUMIN 3.0*   No results for input(s): LIPASE, AMYLASE in the last 72 hours.  CBC:  Recent Labs  06/25/14 0201 06/26/14 0357  WBC 10.8* 16.0*  NEUTROABS 9.2* 14.8*  HGB 12.5* 12.5*  HCT 41.0 40.2  MCV 84.0 82.7  PLT 136* 101*  Cardiac Enzymes:  Recent Labs  06/24/14 1419 06/24/14 2006 06/25/14 0201  TROPONINI 0.06* 0.06* 0.05*    Coagulation Studies:  Recent Labs  06/24/14 0500  LABPROT 16.5*  INR 1.32    Other: Invalid input(s): POCBNP  Recent Labs  06/25/14 1104  DDIMER 8.68*   No results for input(s): HGBA1C in the last 72 hours. No results for input(s): CHOL, HDL, LDLCALC, TRIG, CHOLHDL in the last 72 hours. No results for input(s): TSH, T4TOTAL, T3FREE, THYROIDAB in the last 72 hours.  Invalid input(s): FREET3 No results for input(s): VITAMINB12, FOLATE, FERRITIN, TIBC, IRON, RETICCTPCT in the last 72 hours.   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     Medications:    Infusions: . heparin 1,300 Units/hr (06/25/14 1344)    Scheduled Medications: . aspirin  81 mg Oral Daily  . atorvastatin  10 mg Oral q1800  . buPROPion  300 mg Oral Daily  . carvedilol  12.5 mg Oral BID WC  . colchicine  0.6 mg Oral Daily  . docusate sodium  100 mg Oral BID  . furosemide  60 mg Intravenous BID  . ipratropium-albuterol  3 mL Nebulization Q4H  . isosorbide  mononitrate  15 mg Oral Daily  . mometasone-formoterol  2 puff Inhalation BID  . nicotine  21 mg Transdermal Daily  . pantoprazole  40 mg Oral Daily  . potassium chloride  20 mEq Oral BID  . sodium chloride  3 mL Intravenous Q12H  . spironolactone  25 mg Oral Daily    Assessment/ Plan:   Principal Problem:   Acute respiratory distress Active Problems:   COPD (chronic obstructive pulmonary disease), gold stage C.   Hyperlipidemia   S/P CABG (coronary artery bypass graft)   Chronic kidney disease (CKD), stage III (moderate)   Tobacco use disorder   PVD (peripheral vascular disease)   Essential hypertension   PAF (paroxysmal atrial fibrillation)   CAD (coronary artery disease)   Chronic combined systolic and diastolic CHF (congestive heart failure)   Anemia   Hypertensive heart disease   Right hip pain   Fall   Ear lobe laceration   Hip pain, acute   Acute hip pain   Acute on chronic systolic CHF (congestive heart failure)   Dislocation of hip, obturator, right, closed   Sacral fracture, closed  1.  Acute respiratory failure:  His Troponin levels are flat - not c/w an acute coronary syndrome.  He had a recent fall with a pelvic fracture and has a markedly elevated D-dimer.   I suspect he has had a pulmonary embolus.  VQ has been ordered but not done yet.  I have ordered an echo.  He is on IV heparin.   2. Possible pulmonary embolus:  Agree with VQ scan.  Have ordered an echo.  He should also get venous dopplers of the legs   3. CAD :  His last cath was in Nov. 2013 that shows: patent  LIMA to LAD, SVG -RCA.  The SVG - OM and the SVG - Diag were occluded.   His respiratory distress over the past several days is not consistent with an acute coronary syndrome  4. CHK - stage III - plans per Int. Med.    5. Acute CHF :  Has improved with diuresis.  He has severe COPD and has significant leg edema.  Will get a repeat echo since he has had an acute decompensation  since his previous  echo Dec. 15, 2016.      Disposition:  Length of Stay: 2  Vesta MixerPhilip J. Nahser, Montez HagemanJr., MD, [facility] Smith HospitalFACC 06/26/2014, 9:07 AM Office [phone] Pager [phone]

## 2014-06-27 DIAGNOSIS — I2699 Other pulmonary embolism without acute cor pulmonale: Secondary | ICD-10-CM

## 2014-06-27 LAB — BASIC METABOLIC PANEL
Anion gap: 10 (ref 5–15)
BUN: 48 mg/dL — ABNORMAL HIGH (ref 6–23)
CO2: 28 mmol/L (ref 19–32)
Calcium: 8.3 mg/dL — ABNORMAL LOW (ref 8.4–10.5)
Chloride: 99 mmol/L (ref 96–112)
Creatinine, Ser: 1.93 mg/dL — ABNORMAL HIGH (ref 0.50–1.35)
GFR, EST AFRICAN AMERICAN: 37 mL/min — AB (ref >90)
GFR, EST NON AFRICAN AMERICAN: 32 mL/min — AB (ref >90)
Glucose, Bld: 121 mg/dL — ABNORMAL HIGH (ref 70–99)
POTASSIUM: 4 mmol/L (ref 3.5–5.1)
Sodium: 137 mmol/L (ref 135–145)

## 2014-06-27 LAB — HEPARIN LEVEL (UNFRACTIONATED)
Heparin Unfractionated: 0.19 IU/mL — ABNORMAL LOW (ref 0.30–0.70)
Heparin Unfractionated: 0.17 IU/mL — ABNORMAL LOW (ref 0.30–0.70)

## 2014-06-27 LAB — CBC
HCT: 37 % — ABNORMAL LOW (ref 39.0–52.0)
Hemoglobin: 11.4 g/dL — ABNORMAL LOW (ref 13.0–17.0)
MCH: 25.4 pg — ABNORMAL LOW (ref 26.0–34.0)
MCHC: 30.8 g/dL (ref 30.0–36.0)
MCV: 82.6 fL (ref 78.0–100.0)
PLATELETS: 112 10*3/uL — AB (ref 150–400)
RBC: 4.48 MIL/uL (ref 4.22–5.81)
RDW: 17.3 % — ABNORMAL HIGH (ref 11.5–15.5)
WBC: 14.7 10*3/uL — AB (ref 4.0–10.5)

## 2014-06-27 LAB — URINE CULTURE
COLONY COUNT: NO GROWTH
CULTURE: NO GROWTH

## 2014-06-27 LAB — MAGNESIUM: Magnesium: 2.1 mg/dL (ref 1.5–2.5)

## 2014-06-27 MED ORDER — WARFARIN - PHARMACIST DOSING INPATIENT: Freq: Every day | Status: DC

## 2014-06-27 MED ORDER — HEPARIN (PORCINE) IN NACL 100-0.45 UNIT/ML-% IJ SOLN
1700.0000 [IU]/h | INTRAMUSCULAR | Status: DC
  Administered 2014-06-27 – 2014-06-29 (×3): 1700 [IU]/h via INTRAVENOUS
  Filled 2014-06-27 (×3): qty 250

## 2014-06-27 MED ORDER — WARFARIN VIDEO
Freq: Once | Status: AC
  Administered 2014-06-27: 12:00:00

## 2014-06-27 MED ORDER — HEPARIN (PORCINE) IN NACL 100-0.45 UNIT/ML-% IJ SOLN: 1450.0000 [IU]/h | INTRAMUSCULAR | Status: DC

## 2014-06-27 MED ORDER — PATIENT'S GUIDE TO USING COUMADIN BOOK
Freq: Once | Status: AC
  Administered 2014-06-27: 12:00:00
  Filled 2014-06-27: qty 1

## 2014-06-27 MED ORDER — WARFARIN SODIUM 5 MG PO TABS
5.0000 mg | ORAL_TABLET | Freq: Once | ORAL | Status: AC
  Administered 2014-06-27: 5 mg via ORAL
  Filled 2014-06-27: qty 1

## 2014-06-27 NOTE — Progress Notes (Addendum)
ANTICOAGULATION CONSULT NOTE - Follow Up Consult  Pharmacy Consult for Heparin/warfarin Indication: r/o pulmonary embolus  No Known Allergies  Patient Measurements: Height: 6\' 1"  (185.4 cm) Weight: 181 lb 3.5 oz (82.2 kg) IBW/kg (Calculated) : 79.9 Heparin Dosing Weight: actual weight  Vital Signs: Temp: 98 F (36.7 C) (02/16 0510) Temp Source: Oral (02/16 0510) BP: 136/63 mmHg (02/16 0510) Pulse Rate: 84 (02/16 0510)  Labs:  Recent Labs  06/24/14 1419 06/24/14 2006  06/25/14 0201 06/25/14 2100 06/26/14 0357 06/27/14 0450  HGB  --   --   < > 12.5*  --  12.5* 11.4*  HCT  --   --   --  41.0  --  40.2 37.0*  PLT  --   --   --  136*  --  101* 112*  HEPARINUNFRC  --   --   --   --  0.63 0.46 0.19*  CREATININE  --   --   --  1.96*  --  2.11* 1.93*  TROPONINI 0.06* 0.06*  --  0.05*  --   --   --   < > = values in this interval not displayed.  Estimated Creatinine Clearance: 37.4 mL/min (by C-G formula based on Cr of 1.93).   Medications:  Infusions:  . heparin 1,300 Units/hr (06/27/14 0558)    Assessment: 74 yo male admitted 06/23/14 s/p fall. He was recently admitted 2/3-06/18/14 for an acute COPD exacerbation. On 2/14, he developed acute respiratory distress, pO2 below reportable range and elevated Ddimer.  Prophylactic Lovenox (last dose 2/13 ~2200) was d/c.  Plt noted to be chronically low.  Pharmacy consulted to dose IV heparin for rule out PE.  VQ scan has returned with intermediate probability for PE and LE dopplers negative for DVT.  - baseline INR = 1.32 (2/13)  Today, 06/27/2014:  Heparin level 0.19, decreased to SUB therapeutic overnight on heparin at 13 ml/hr.  Heparin infusing at ordered rate and night RN states no issues with infusion  CBC: Hgb 11.4 (small decrease overnight), Plt 112 (chronically low and decreased)  No active bleeding noted by RN   Diet: Cardiac, NPO last several days d/t N/V  Drug-Drug interactions with warfarin: none (on ASA 81mg   with h/o CAD)  SCr elevated with h/o CKD - DOACs not preferred  Goal of Therapy:  Heparin level 0.3-0.7 units/ml Monitor platelets by anticoagulation protocol: Yes   Plan:  Day #1 warfarin/heparin VTE overlap  Increase heparin IV infusion at 1450 units/hr (14.5 ml/hr)  Check 8h heparin level  Warfarin 5mg  PO x 1 tonight - dose on conservative side d/t NPO with N/V  Daily INR, heparin level and CBC  Continue to monitor H&H and platelets  Coumadin education materials  Juliette Alcideustin Zanya Lindo, PharmD, BCPS.   Pager: [phone] 06/27/2014 7:31 AM

## 2014-06-27 NOTE — Procedures (Signed)
Pt refuses bipap for the night.  

## 2014-06-27 NOTE — Progress Notes (Signed)
OT Cancellation Note  Patient Details Name: Allen KellMaurice W Brault MRN: [id number] DOB: 03/02/1941   Cancelled Treatment:    Reason Eval/Treat Not Completed: Other (comment).  Noted pt with intermediate probability of PE and heparin is not therapeutic.  Will check back.  Narayan Scull 06/27/2014, 12:56 PM  Marica OtterMaryellen Paxtyn Boyar, OTR/L [phone] 06/27/2014

## 2014-06-27 NOTE — Progress Notes (Signed)
PROGRESS NOTE  Subjective:   Nathan [name] is a 74 y.o. male with history of CAD s/p CABG, AFL s/p ablation (details unclear),PAF (last documented 04/2012 - not on [facility]), chronic combined heart failure, hypertension, severe COPD with ongoing tobacco use previously on home O2, CKD (baseline Cr 1.5-1.7), stroke, GERD, PAD (LE stenting 2005, R CEA 2013), and medication noncompliance.  He fell last week and suffered a pelvic fracture.  He has had progressive dyspnea and has had a markely elevated D-dimer.    He has been empirically treated with IV heparin.   VQ scan done yesterday was intermediate probability.   Showed 2 peripheral wedge shaped defects in the RLL   Objective:    Vital Signs:   Temp:  [97.5 F (36.4 C)-98.1 F (36.7 C)] 98 F (36.7 C) (02/16 0510) Pulse Rate:  [77-88] 84 (02/16 0510) Resp:  [18-20] 20 (02/16 0510) BP: (107-136)/(46-72) 136/63 mmHg (02/16 0510) SpO2:  [87 %-98 %] 96 % (02/16 0510) Weight:  [181 lb 3.5 oz (82.2 kg)] 181 lb 3.5 oz (82.2 kg) (02/16 0510)  Last BM Date: 06/23/14   24-hour weight change: Weight change: -5 lb 8.2 oz (-2.5 kg)  Weight trends: Filed Weights   06/25/14 0538 06/25/14 1244 06/27/14 0510  Weight: 184 lb 11.9 oz (83.8 kg) 186 lb 11.7 oz (84.7 kg) 181 lb 3.5 oz (82.2 kg)    Intake/Output:  02/15 0701 - 02/16 0700 In: 552 [P.O.:240; I.V.:312] Out: 2150 [Urine:2150]     Physical Exam: BP 136/63 mmHg  Pulse 84  Temp(Src) 98 F (36.7 C) (Oral)  Resp 20  Ht  (1.854 m)  Wt 181 lb 3.5 oz (82.2 kg)  BMI 23.91 kg/m2  SpO2 96%  Wt Readings from Last 3 Encounters:  06/27/14 181 lb 3.5 oz (82.2 kg)  06/23/14 186 lb (84.369 kg)  06/20/14 200 lb (90.719 kg)    General: Vital signs reviewed and noted.   Head: Normocephalic, atraumatic.  Eyes: conjunctivae/corneas clear.  EOM's intact.   Throat: normal  Neck:  normal   Lungs:     Rales, rhonchi  Heart:   RR   Abdomen:  Soft, non-tender, non-distended      Extremities: 2-3+ pitting edema .     Neurologic: A&O X3, CN II - XII are grossly intact.   Psych: Normal     Labs: BMET:  Recent Labs  06/26/14 0357 06/27/14 0450  NA 133* 137  K 4.6 4.0  CL 96 99  CO2 30 28  GLUCOSE 115* 121*  BUN 41* 48*  CREATININE 2.11* 1.93*  CALCIUM 7.8* 8.3*  MG  --  2.1    Liver function tests: No results for input(s): AST, ALT, ALKPHOS, BILITOT, PROT, ALBUMIN in the last 72 hours. No results for input(s): LIPASE, AMYLASE in the last 72 hours.  CBC:  Recent Labs  06/25/14 0201 06/26/14 0357 06/27/14 0450  WBC 10.8* 16.0* 14.7*  NEUTROABS 9.2* 14.8*  --   HGB 12.5* 12.5* 11.4*  HCT 41.0 40.2 37.0*  MCV 84.0 82.7 82.6  PLT 136* 101* 112*    Cardiac Enzymes:  Recent Labs  06/24/14 1419 06/24/14 2006 06/25/14 0201  TROPONINI 0.06* 0.06* 0.05*    Coagulation Studies: No results for input(s): LABPROT, INR in the last 72 hours.  Other: Invalid input(s): POCBNP  Recent Labs  06/25/14 1104  DDIMER 8.68*   No results for input(s): HGBA1C in the last 72 hours. No results for input(s):  CHOL, HDL, LDLCALC, TRIG, CHOLHDL in the last 72 hours. No results for input(s): TSH, T4TOTAL, T3FREE, THYROIDAB in the last 72 hours.  Invalid input(s): FREET3 No results for input(s): VITAMINB12, FOLATE, FERRITIN, TIBC, IRON, RETICCTPCT in the last 72 hours.   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     Medications:    Infusions: . heparin 1,300 Units/hr (06/27/14 0558)    Scheduled Medications: . aspirin  81 mg Oral Daily  . atorvastatin  10 mg Oral q1800  . buPROPion  300 mg Oral Daily  . carvedilol  12.5 mg Oral BID WC  . colchicine  0.6 mg Oral Daily  . docusate sodium  100 mg Oral BID  . furosemide  60 mg Intravenous BID  . ipratropium-albuterol  3 mL Nebulization Q4H  . isosorbide mononitrate  15 mg Oral Daily  . mometasone-formoterol  2 puff Inhalation BID  . nicotine  21 mg Transdermal  Daily  . pantoprazole  40 mg Oral Daily  . potassium chloride  20 mEq Oral BID  . sodium chloride  3 mL Intravenous Q12H  . spironolactone  25 mg Oral Daily    Assessment/ Plan:   Principal Problem:   Acute respiratory distress Active Problems:   COPD (chronic obstructive pulmonary disease), gold stage C.   Hyperlipidemia   S/P CABG (coronary artery bypass graft)   Chronic kidney disease (CKD), stage III (moderate)   Tobacco use disorder   PVD (peripheral vascular disease)   Essential hypertension   PAF (paroxysmal atrial fibrillation)   CAD (coronary artery disease)   Chronic combined systolic and diastolic CHF (congestive heart failure)   Anemia   Hypertensive heart disease   Right hip pain   Fall   Ear lobe laceration   Hip pain, acute   Acute hip pain   Acute on chronic systolic CHF (congestive heart failure)   Dislocation of hip, obturator, right, closed   Sacral fracture, closed  1.  Acute respiratory failure:  His Troponin levels are flat - not c/w an acute coronary syndrome.  He had a recent fall with a pelvic fracture and has a markedly elevated D-dimer.   I suspect he has had a pulmonary embolus.  VQ is intermediate probability for PE.    Echo shows mild reduction of LV function ( unchanged from previous echo Dec. 12, 2015)  with mild RV dysfunction.  Moderate MR.   He is on IV heparin.     2.   pulmonary embolus:     His clinical picture is c/w PE - recent fall with pelvic fracture with acute respiratory decompensation .     He has 2 wedge shaped , peripheral defects on VQ scan.  Venous dopplers of the legs are negative for DVT. I think he should be treated with coumadin for 6 months give his likelyhood of having had pulmonary emboli .   Final decision will be up to Dr. Janee Mornhompson.   He has moderate - severe renal dysfunction and dosing of the NOACs may be difficult.  His INRs can be followed by his primary medical doctor.  3. CAD :  His last cath was in Nov. 2013 that  shows: patent  LIMA to LAD, SVG -RCA.  The SVG - OM and the SVG - Diag were occluded.   His respiratory distress over the past several days is not consistent with an acute coronary syndrome  4. CHK - stage III -  plans per Int. Med.    5. Acute on chronic systolic  CHF :  Has improved with diuresis.  He has severe COPD and has significant leg edema.    He can probably be changed back to his home dose of Lasix soon.    Will sign off.  Call for questions. He may follow up with Dr. Katrinka Blazing.     Disposition:  Length of Stay: 3  Vesta Mixer, Montez Hageman., MD, [facility] 06/27/2014, 7:21 AM Office [phone] Pager [phone]

## 2014-06-27 NOTE — Progress Notes (Signed)
PT Cancellation Note  Patient Details Name: Nathan [name] MRN: [id number] DOB: 10/30/1940   Cancelled Treatment:    Reason Eval/Treat Not Completed: Medical issues which prohibited therapy. Patient on Heparin for R/O PE,   Rada HayHill, Dontea Corlew Elizabeth 06/27/2014, 1:15 PM Blanchard KelchKaren Cyleigh Massaro PT [phone]

## 2014-06-27 NOTE — Progress Notes (Signed)
PHARMACY - HEPARIN (brief note)  Patient on IV heparin gtt @ 1450 units/hr for treatment of pulmonary embolism.  Transition to oral warfarin intiiated today.  Heparin rate = 0.17 (goal 0.3-0.7) No complications of therapy noted  Plan:  Increase heparin gtt to 1700 units/hr           Check heparin level in 8 hr  Terrilee FilesLeann Montrice Montuori, PharmD

## 2014-06-27 NOTE — Progress Notes (Signed)
[facility] PROGRESS NOTE  Nathan [name]:[id number] DOB: [birth year] DOA: 06/23/2014 PCP: Lillia Mountain, MD  Assessment/Plan: #1 acute respiratory failure Questionable etiology. Likely secondary to probable PE and acute on chronic combined systolic and diastolic heart failure. Clinical improvement. Patient currently off BiPAP. Patient still volume overloaded on examination. D-dimer was elevated and a such VQ scan was done which showed intermediate probability for PE and 2 wedge-shaped defects in the right lower lobe. Lower extremity Dopplers negative. Patient on full dose heparin for now, we'll continue and transition to oral Coumadin. Patient will likely need at least 6 months. Chest x-ray with slight improvement in left lung consolidation,  persistent edema right pleural effusion. Continue IV Lasix, scheduled nebs, chest PT, cardiac medications. Cardiology following and appreciate the input and recommendations.  #2 acute on chronic combined systolic and diastolic heart failure Patient with complaints of worsening shortness of breath, which has improved. Patient with signs of volume overload with JVD and bilateral lower extremity edema. Patient with improvement with his breathing. Patient currently off BiPAP and on room air speaking in full sentences. Pro BNP in the 500s. Cardiac enzymes minimally elevated.  Patient with recent 2-D echo done 04/22/2014 with a EF of 40-45%. Mildly dilated cavity size. Moderate LVH. Aortic valvular sclerosis. Moderate mitral valvular regurgitation. Moderately dilated left atrium. Moderately reduced right ventricular systolic function. Daily weights. Strict I's and O. patient is - 6.4 L during this hospitalization. Repeat 2-D echo unchanged from prior 2-D echo. Continue Lasix 60 mg IV every 12 hours. Continue Coreg, Aldactone, Imdur, Lipitor. Monitor renal function. Cardiology following and appreciate input and recommendations.   #3 probable pulmonary  emboli Patient had presented with a recent fall with pelvic fracture with acute respiratory decompensation during the hospitalization. VQ scan done showed intermediate probability for PE and 2 wedge-shaped defects in the right lower lobe. Lower extremity Dopplers were negative for DVT. Patient was placed empirically on IV heparin with clinical improvement. Will start patient on oral Coumadin and treated for at least 6 months given the likelihood of him having a pulmonary emboli.  #4 right obturator ring fracture/right sacral and left fractures/bilateral hip girdle muscular strain/Right hip pain Secondary to mechanical fall. Patient with improvement in his pain. Continue pain management. Nonweightbearing on the right lower extremity. PT/OT. Patient has been seen by Dr. August Saucer of orthopedics who is recommending touchdown weightbearing with conservative treatment and outpatient follow-up in 3 weeks with repeat x-rays.  #5 mechanical fall PT/OT.  #6 COPD Gold stage III Stable. No wheezing. Poor air movement. Continue home regimen of dulera. Continue scheduled nebs. Nebulizers as needed.  #7 coronary artery disease status post CABG/acute onchronic combined systolic and diastolic heart failure Patient seems as if likely an acute on chronic heart failure. Patient with clinical improvement.  Patient with positive JVD and improving lower extremity edema. Cardiac enzymes minimally elevated. Continue IV Lasix. Continue home regimen of Coreg, Aldactone, indoor, Lipitor. Cardiology ff.  #8 chronic kidney disease stage III Stable. Monitor closely with diuresis.  #9 leukocytosis Likely reactive leukocytosis. Patient with no significant symptoms that would be worrisome for pneumonia. UA negative. Blood cultures pending. Follow for now.  #10 tobacco abuse Tobacco cessation. Nicotine patch.  #11 hypertension Stable. Continue current regimen.  #12 ear lobe laceration Status post repair. ED physician.  #13  prophylaxis PPI for GI prophylaxis. Lovenox for DVT prophylaxis.  Code Status: Full Family Communication: Updated patient. No family at bedside. Disposition Plan: Remain in telemetry.   Consultants:  Cardiology : Dr Gala RomneyBensimhon 06/24/14  Orthopedics: Dr August Saucerean 06/25/14  Procedures:  Plain films of the right hip 06/23/2014  MRI of the right hip 06/23/2014  CXR 06/25/14 pending  2-D echo 06/26/2014  VQ scan 06/26/2014  Antibiotics:  None  HPI/Subjective: Patient states shortness of breath improved. Patient states improvement in right hip pain. No complaints.  Objective: Filed Vitals:   06/27/14 0510  BP: 136/63  Pulse: 84  Temp: 98 F (36.7 C)  Resp: 20    Intake/Output Summary (Last 24 hours) at 06/27/14 1139 Last data filed at 06/27/14 0851  Gross per 24 hour  Intake    753 ml  Output   1850 ml  Net  -1097 ml   Filed Weights   06/25/14 0538 06/25/14 1244 06/27/14 0510  Weight: 83.8 kg (184 lb 11.9 oz) 84.7 kg (186 lb 11.7 oz) 82.2 kg (181 lb 3.5 oz)    Exam:   General:  Sitting up on Conde in NAD.  Cardiovascular: RRR.   Respiratory: Fair air movement.Bibasilatr crackles. Some scattered coarse BS.  Abdomen: Soft/NT/ND/+BS  Musculoskeletal: No c/c. 2 + BLE edema  Data Reviewed: Basic Metabolic Panel:  Recent Labs Lab 06/23/14 1412 06/23/14 1413 06/24/14 0500 06/25/14 0201 06/26/14 0357 06/27/14 0450  NA  --  142 140 137 133* 137  K  --  5.0 3.9 3.9 4.6 4.0  CL  --  105 102 100 96 99  CO2  --  30 33* 31 30 28   GLUCOSE  --  108* 102* 127* 115* 121*  BUN  --  35* 32* 34* 41* 48*  CREATININE  --  1.61* 1.90* 1.96* 2.11* 1.93*  CALCIUM  --  8.3* 8.1* 8.0* 7.8* 8.3*  MG 2.1  --   --   --   --  2.1   Liver Function Tests:  Recent Labs Lab 06/23/14 1413  AST 28  ALT 76*  ALKPHOS 53  BILITOT 0.7  PROT 5.5*  ALBUMIN 3.0*   No results for input(s): LIPASE, AMYLASE in the last 168 hours. No results for input(s): AMMONIA in the last 168  hours. CBC:  Recent Labs Lab 06/23/14 1413 06/24/14 0500 06/25/14 0201 06/26/14 0357 06/27/14 0450  WBC 11.2* 10.3 10.8* 16.0* 14.7*  NEUTROABS 9.9*  --  9.2* 14.8*  --   HGB 12.4* 12.5* 12.5* 12.5* 11.4*  HCT 41.3 41.1 41.0 40.2 37.0*  MCV 85.3 84.6 84.0 82.7 82.6  PLT 142* 147* 136* 101* 112*   Cardiac Enzymes:  Recent Labs Lab 06/24/14 1419 06/24/14 2006 06/25/14 0201  TROPONINI 0.06* 0.06* 0.05*   BNP (last 3 results)  Recent Labs  06/24/14 1420 06/25/14 0201 06/26/14 0357  BNP 596.6* 539.7* 626.8*    ProBNP (last 3 results)  Recent Labs  01/04/14 0450 04/21/14 1504  PROBNP 3786.0* 13092.0*    CBG: No results for input(s): GLUCAP in the last 168 hours.  Recent Results (from the past 240 hour(s))  Culture, blood (routine x 2)     Status: None (Preliminary result)   Collection Time: 06/26/14  8:20 AM  Result Value Ref Range Status   Specimen Description BLOOD LEFT HAND  Final   Special Requests BOTTLES DRAWN AEROBIC ONLY 3CC  Final   Culture   Final           BLOOD CULTURE RECEIVED NO GROWTH TO DATE CULTURE WILL BE HELD FOR 5 DAYS BEFORE ISSUING A FINAL NEGATIVE REPORT Performed at [facility]    Report  Status PENDING  Incomplete  Culture, blood (routine x 2)     Status: None (Preliminary result)   Collection Time: 06/26/14  8:23 AM  Result Value Ref Range Status   Specimen Description BLOOD LEFT ARM  Final   Special Requests BOTTLES DRAWN AEROBIC AND ANAEROBIC 10CC  Final   Culture   Final           BLOOD CULTURE RECEIVED NO GROWTH TO DATE CULTURE WILL BE HELD FOR 5 DAYS BEFORE ISSUING A FINAL NEGATIVE REPORT Performed at [facility]    Report Status PENDING  Incomplete     Studies: Nm Pulmonary Perf And Vent  06/26/2014   CLINICAL DATA:  Respiratory distress  EXAM: NUCLEAR MEDICINE VENTILATION - PERFUSION LUNG SCAN  TECHNIQUE: Ventilation images were obtained in multiple projections using inhaled aerosol technetium 99 M  DTPA. Perfusion images were obtained in multiple projections after intravenous injection of Tc-22m MAA.  RADIOPHARMACEUTICALS:  Fifty mCi Tc-55m DTPA aerosol and 5.5 mCi Tc-25m MAA  COMPARISON:  Chest x-ray from the same day.  FINDINGS: There are 2 peripheral and wedge-shaped defects in the right lower lobe, 1 large and the other moderate, located in the posterior and apical segments respectively. The largest is triple matched based on chest x-ray from today; the ventilation defect is larger in the apical defect. Poor ventilation in the left lower lobe, with better perfusion, correlating with pleural fluid and airspace disease on preceding chest x-ray. There could be a small mismatched defect in the lingula based on left lateral imaging. The lung contours are altered in the setting of bilateral pleural effusions.  IMPRESSION: Intermediate probability for pulmonary embolism by PIOPED II. Bibasilar airspace disease and pleural effusions significantly limits the utility of V/Q scan.   Electronically Signed   By: Marnee Spring M.D.   On: 06/26/2014 12:00   Dg Chest Port 1 View  06/26/2014   CLINICAL DATA:  Dyspnea  EXAM: PORTABLE CHEST - 1 VIEW  COMPARISON:  06/25/2014  FINDINGS: There is unchanged cardiomegaly. There is improvement, with less confluent consolidation in the lateral left lung although ground-glass opacities persist bilaterally. There is no change in the moderate right pleural effusion.  IMPRESSION: Slight improvement in the left lung consolidation. Persistent edema and right pleural effusion.   Electronically Signed   By: Ellery Plunk M.D.   On: 06/26/2014 06:14    Scheduled Meds: . aspirin  81 mg Oral Daily  . atorvastatin  10 mg Oral q1800  . buPROPion  300 mg Oral Daily  . carvedilol  12.5 mg Oral BID WC  . colchicine  0.6 mg Oral Daily  . docusate sodium  100 mg Oral BID  . furosemide  60 mg Intravenous BID  . ipratropium-albuterol  3 mL Nebulization Q4H  . isosorbide  mononitrate  15 mg Oral Daily  . mometasone-formoterol  2 puff Inhalation BID  . nicotine  21 mg Transdermal Daily  . pantoprazole  40 mg Oral Daily  . patient's guide to using coumadin book   Does not apply Once  . potassium chloride  20 mEq Oral BID  . sodium chloride  3 mL Intravenous Q12H  . spironolactone  25 mg Oral Daily  . warfarin  5 mg Oral ONCE-1800  . warfarin   Does not apply Once  . Warfarin - Pharmacist Dosing Inpatient   Does not apply q1800   Continuous Infusions: . heparin 1,450 Units/hr (06/27/14 0745)    Principal Problem:   Acute respiratory  distress Active Problems:   Acute on chronic systolic CHF (congestive heart failure)   Pulmonary embolus: Probable   COPD (chronic obstructive pulmonary disease), gold stage C.   Hyperlipidemia   S/P CABG (coronary artery bypass graft)   Chronic kidney disease (CKD), stage III (moderate)   Tobacco use disorder   PVD (peripheral vascular disease)   Essential hypertension   PAF (paroxysmal atrial fibrillation)   CAD (coronary artery disease)   Chronic combined systolic and diastolic CHF (congestive heart failure)   Anemia   Hypertensive heart disease   Right hip pain   Fall   Ear lobe laceration   Hip pain, acute   Acute hip pain   Dislocation of hip, obturator, right, closed   Sacral fracture, closed    Time spent: 40 mins    [facility] MD  [facility] Pager [phone]. If 7PM-7AM, please contact night-coverage at [email or web address], password [facility] 06/27/2014, 11:39 AM  LOS: 3 days

## 2014-06-27 NOTE — Procedures (Signed)
CPT not performed because was sick.  Refused treatment and vomited prior to RT's arrival.

## 2014-06-27 NOTE — Progress Notes (Signed)
[facility]  Patient Status: Active (receiving services up to time of hospitalization)  [facility] is providing the following services: RN and PT  If patient discharges after hours, please call [phone].   Lanae CrumblyKristen Hayworth 06/27/2014, 1:19 PM

## 2014-06-27 NOTE — Progress Notes (Signed)
Not performed, pt sleep

## 2014-06-28 DIAGNOSIS — I480 Paroxysmal atrial fibrillation: Secondary | ICD-10-CM

## 2014-06-28 LAB — BASIC METABOLIC PANEL
ANION GAP: 8 (ref 5–15)
BUN: 47 mg/dL — ABNORMAL HIGH (ref 6–23)
CHLORIDE: 96 mmol/L (ref 96–112)
CO2: 30 mmol/L (ref 19–32)
Calcium: 8.2 mg/dL — ABNORMAL LOW (ref 8.4–10.5)
Creatinine, Ser: 1.97 mg/dL — ABNORMAL HIGH (ref 0.50–1.35)
GFR calc Af Amer: 37 mL/min — ABNORMAL LOW (ref >90)
GFR, EST NON AFRICAN AMERICAN: 31 mL/min — AB (ref >90)
Glucose, Bld: 108 mg/dL — ABNORMAL HIGH (ref 70–99)
Potassium: 4.4 mmol/L (ref 3.5–5.1)
SODIUM: 134 mmol/L — AB (ref 135–145)

## 2014-06-28 LAB — HEPARIN LEVEL (UNFRACTIONATED)
HEPARIN UNFRACTIONATED: 0.41 [IU]/mL (ref 0.30–0.70)
Heparin Unfractionated: 0.33 IU/mL (ref 0.30–0.70)

## 2014-06-28 LAB — CBC
HCT: 36.6 % — ABNORMAL LOW (ref 39.0–52.0)
Hemoglobin: 11.2 g/dL — ABNORMAL LOW (ref 13.0–17.0)
MCH: 25.3 pg — ABNORMAL LOW (ref 26.0–34.0)
MCHC: 30.6 g/dL (ref 30.0–36.0)
MCV: 82.8 fL (ref 78.0–100.0)
PLATELETS: 108 10*3/uL — AB (ref 150–400)
RBC: 4.42 MIL/uL (ref 4.22–5.81)
RDW: 17 % — ABNORMAL HIGH (ref 11.5–15.5)
WBC: 13.5 10*3/uL — ABNORMAL HIGH (ref 4.0–10.5)

## 2014-06-28 LAB — PROTIME-INR
INR: 1.05 (ref 0.00–1.49)
Prothrombin Time: 13.8 seconds (ref 11.6–15.2)

## 2014-06-28 MED ORDER — POLYETHYLENE GLYCOL 3350 17 G PO PACK
17.0000 g | PACK | Freq: Every day | ORAL | Status: DC
  Administered 2014-06-28 – 2014-06-30 (×3): 17 g via ORAL
  Filled 2014-06-28 (×4): qty 1

## 2014-06-28 MED ORDER — ROSUVASTATIN CALCIUM 10 MG PO TABS
5.0000 mg | ORAL_TABLET | Freq: Every day | ORAL | Status: DC
  Filled 2014-06-28 (×2): qty 0.5

## 2014-06-28 MED ORDER — LEVALBUTEROL HCL 1.25 MG/0.5ML IN NEBU
1.2500 mg | INHALATION_SOLUTION | Freq: Four times a day (QID) | RESPIRATORY_TRACT | Status: DC
  Administered 2014-06-28 – 2014-07-05 (×29): 1.25 mg via RESPIRATORY_TRACT
  Filled 2014-06-28 (×38): qty 0.5

## 2014-06-28 MED ORDER — FUROSEMIDE 40 MG PO TABS
60.0000 mg | ORAL_TABLET | Freq: Two times a day (BID) | ORAL | Status: DC
  Administered 2014-06-28 – 2014-06-30 (×4): 60 mg via ORAL
  Filled 2014-06-28 (×8): qty 1

## 2014-06-28 MED ORDER — LEVALBUTEROL HCL 1.25 MG/0.5ML IN NEBU
1.2500 mg | INHALATION_SOLUTION | Freq: Four times a day (QID) | RESPIRATORY_TRACT | Status: DC | PRN
  Filled 2014-06-28: qty 0.5

## 2014-06-28 MED ORDER — DILTIAZEM HCL 30 MG PO TABS
30.0000 mg | ORAL_TABLET | Freq: Four times a day (QID) | ORAL | Status: DC
  Administered 2014-06-28 – 2014-06-30 (×9): 30 mg via ORAL
  Filled 2014-06-28 (×9): qty 1

## 2014-06-28 MED ORDER — WARFARIN SODIUM 6 MG PO TABS
6.0000 mg | ORAL_TABLET | Freq: Once | ORAL | Status: AC
  Administered 2014-06-28: 6 mg via ORAL
  Filled 2014-06-28: qty 1

## 2014-06-28 MED ORDER — IPRATROPIUM-ALBUTEROL 0.5-2.5 (3) MG/3ML IN SOLN
3.0000 mL | Freq: Four times a day (QID) | RESPIRATORY_TRACT | Status: DC
  Administered 2014-06-28: 3 mL via RESPIRATORY_TRACT
  Filled 2014-06-28: qty 3

## 2014-06-28 MED ORDER — SENNA 8.6 MG PO TABS
1.0000 | ORAL_TABLET | Freq: Two times a day (BID) | ORAL | Status: DC
  Administered 2014-06-28 – 2014-06-30 (×5): 8.6 mg via ORAL
  Filled 2014-06-28 (×5): qty 1

## 2014-06-28 MED ORDER — METOPROLOL TARTRATE 1 MG/ML IV SOLN
5.0000 mg | INTRAVENOUS | Status: AC
  Administered 2014-06-28: 5 mg via INTRAVENOUS
  Filled 2014-06-28: qty 5

## 2014-06-28 NOTE — Care Management Note (Signed)
    Page 1 of 1   06/28/2014     2:43:46 PM CARE MANAGEMENT NOTE 06/28/2014  Patient:  Allen KellKENNEDY,Neville W   Account Number:  [id number]  Date Initiated:  06/24/2014  Documentation initiated by:  [facility]  Subjective/Objective Assessment:   fall at home, CHF     Action/Plan:   lives at home with wife   Anticipated DC Date:  06/30/2014   Anticipated DC Plan:  SKILLED NURSING FACILITY  In-house referral  Clinical Social Worker      DC Planning Services  CM consult      Choice offered to / List presented to:             Status of service:  In process, will continue to follow Medicare Important Message given?  YES (If response is "NO", the following Medicare IM given date fields will be blank) Date Medicare IM given:  06/27/2014 Medicare IM given by:  Trinna BalloonMcGIBBONEY,COOKIE MYRAETTE Date Additional Medicare IM given:  06/28/2014 Additional Medicare IM given by:  [facility] Bella Brummet  Discharge Disposition:    Per UR Regulation:  Reviewed for med. necessity/level of care/duration of stay  If discussed at Long Length of Stay Meetings, dates discussed:    Comments:  06/28/14 Lanier ClamKathy Domnic Vantol RN BSN NCM [phone] afib,chf,copd-iv lasix,iv heparing,nebs,morphine iv x1.Confirmed active w/[facility] HHRN/PT.PT cons ordered, await recommendations once patient medically stable for PT eval.Likely need SNF.  06/24/2014 1100 NCM spoke to pt and states he lives at home with wife. States he fell when getting up from his bed. States he does not have any DME at home. Waiting final recommendations for home. Isidoro DonningAlesia Shavis RN CCM Case Mgmt phone [phone]

## 2014-06-28 NOTE — Progress Notes (Signed)
PHARMACY - HEPARIN (brief note)  Patient on IV heparin gtt @ 1700 units/hr for treatment of pulmonary embolism.  Transition to oral warfarin intiiated 2/16.  See 2/16 note for full pharmacy note.   Assessement:  0200 HL= 0.33  No complications of therapy noted  Plan:   Continue heparin drip @ current rate (1700 units/hr)  Recheck HL in 8 hours   Lorenza EvangelistGreen, Tifanie Gardiner R 06/28/2014 3:50 AM

## 2014-06-28 NOTE — Progress Notes (Signed)
Telemetry called to say that patient converted to atrial fibrillation/RVR.  Dr. Izola PriceMyers on floor and notified.  Patient had just received breathing treatment, and currently is having no shortness of breath or chest pain.  Philomena Dohenyavid Maliah Pyles RN

## 2014-06-28 NOTE — Progress Notes (Signed)
Patient's wife called, about patient status.  Patient requested that no information be given to wife, and that patient's son Nathan [name], be primary contact.  Philomena Dohenyavid Jaziah Goeller RN

## 2014-06-28 NOTE — Progress Notes (Signed)
SUBJECTIVE:  Feels ok. Minimal palpitations.  He has wheezing and SHOB.  OBJECTIVE:   Vitals:   Filed Vitals:   06/28/14 0500 06/28/14 0557 06/28/14 0903 06/28/14 1406  BP:  138/74  108/56  Pulse:  87  96  Temp:  98.4 F (36.9 C)  98.1 F (36.7 C)  TempSrc:  Oral  Oral  Resp:  20  18  Height:      Weight: 179 lb 7.3 oz (81.4 kg)     SpO2:  99% 95% 93%   I&O's:   Intake/Output Summary (Last 24 hours) at 06/28/14 1847 Last data filed at 06/28/14 1700  Gross per 24 hour  Intake    840 ml  Output   2500 ml  Net  -1660 ml   TELEMETRY: Reviewed telemetry pt in AFib, borderline rate control:     PHYSICAL EXAM General: Well developed, well nourished, in no acute distress Head:   Normal cephalic and atramatic  Lungs:   Mild wheezing bilaterally Heart:  Irregularly irregular S1 S2  No JVD.   Abdomen: abdomen soft and non-tender Msk:  Back normal,  Normal strength and tone for age. Extremities:  Bilateral lower extremity edema.   Neuro: Alert and oriented. Psych:  Normal affect, responds appropriately Skin: No rash   LABS: Basic Metabolic Panel:  Recent Labs  16/10/96 0450 06/28/14 0200  NA 137 134*  K 4.0 4.4  CL 99 96  CO2 28 30  GLUCOSE 121* 108*  BUN 48* 47*  CREATININE 1.93* 1.97*  CALCIUM 8.3* 8.2*  MG 2.1  --    Liver Function Tests: No results for input(s): AST, ALT, ALKPHOS, BILITOT, PROT, ALBUMIN in the last 72 hours. No results for input(s): LIPASE, AMYLASE in the last 72 hours. CBC:  Recent Labs  06/26/14 0357 06/27/14 0450 06/28/14 0200  WBC 16.0* 14.7* 13.5*  NEUTROABS 14.8*  --   --   HGB 12.5* 11.4* 11.2*  HCT 40.2 37.0* 36.6*  MCV 82.7 82.6 82.8  PLT 101* 112* 108*   Cardiac Enzymes: No results for input(s): CKTOTAL, CKMB, CKMBINDEX, TROPONINI in the last 72 hours. BNP: Invalid input(s): POCBNP D-Dimer: No results for input(s): DDIMER in the last 72 hours. Hemoglobin A1C: No results for input(s): HGBA1C in the last 72  hours. Fasting Lipid Panel: No results for input(s): CHOL, HDL, LDLCALC, TRIG, CHOLHDL, LDLDIRECT in the last 72 hours. Thyroid Function Tests: No results for input(s): TSH, T4TOTAL, T3FREE, THYROIDAB in the last 72 hours.  Invalid input(s): FREET3 Anemia Panel: No results for input(s): VITAMINB12, FOLATE, FERRITIN, TIBC, IRON, RETICCTPCT in the last 72 hours. Coag Panel:   Lab Results  Component Value Date   INR 1.05 06/28/2014   INR 1.32 06/24/2014   INR 1.05 10/28/2012    RADIOLOGY: Dg Chest 2 View  06/14/2014   CLINICAL DATA:  Worsening shortness of breath  EXAM: CHEST  2 VIEW  COMPARISON:  06/06/2014  FINDINGS: Bilateral pleural effusions, moderate on the right and small on the left. The left effusion has slightly increased from comparison study, with loculation along the lateral and posterior chest wall. The underlying lung is obscured at the right base. There are pleural calcifications at the bilateral apex, but none definitely seen at the bases.  Stable heart size and aortic contours post CABG.  No pulmonary edema.  No pneumothorax.  IMPRESSION: Moderate right and small left pleural effusions. The partially loculated left pleural effusion has mildly increased since 06/06/2014.   Electronically Signed  By: Tiburcio Pea M.D.   On: 06/14/2014 18:29   Dg Chest 2 View (if Patient Has Fever And/or Copd)  06/06/2014   CLINICAL DATA:  Chest pain and shortness of breath  EXAM: CHEST  2 VIEW  COMPARISON:  04/23/2014  FINDINGS: Cardiac shadow is stable but mildly enlarged. Postsurgical changes are again seen. Bilateral pleural effusions are noted which appears stable when compare with the prior study. Likely underlying atelectasis is present. No new focal abnormality is seen.  IMPRESSION: Stable bilateral pleural effusions. No new focal abnormality is noted.   Electronically Signed   By: Alcide Clever M.D.   On: 06/06/2014 19:40   Ct Head Wo Contrast  06/23/2014   CLINICAL DATA:  Larey Seat about 8  hr ago now with injury to the right side of the head and ear.  EXAM: CT HEAD WITHOUT CONTRAST  TECHNIQUE: Contiguous axial images were obtained from the base of the skull through the vertex without intravenous contrast.  COMPARISON:  09/19/2011; 09/11/2011  FINDINGS: Similar findings of atrophy with diffuse sulcal prominence, centralized volume loss and mild commensurate expected dilatation of the ventricular system. Scattered periventricular hypodensities are unchanged compatible with microvascular ischemic disease. The gray-white differentiation is otherwise well maintained without CT evidence of acute large territory infarct. No intraparenchymal or extra-axial mass or hemorrhage. Unchanged size and configuration of the ventricles and basilar cisterns. No midline shift. Intracranial atherosclerosis. Limited visualization of the paranasal sinuses and mastoid air cells is normal. No air-fluid levels. Regional soft tissues appear normal. Post left-sided cataract surgery.  IMPRESSION: Atrophy and microvascular ischemic disease without acute intracranial process.   Electronically Signed   By: Simonne Come M.D.   On: 06/23/2014 15:01   Ct Chest Wo Contrast  06/14/2014   CLINICAL DATA:  74 year old male with progressive shortness of breath over the past month and bilateral pleural effusions  EXAM: CT CHEST WITHOUT CONTRAST  TECHNIQUE: Multidetector CT imaging of the chest was performed following the standard protocol without IV contrast.  COMPARISON:  Prior chest x-ray 06/14/2014  FINDINGS: Mediastinum: Unremarkable thyroid gland. Mediastinal lymph nodes remain within normal limits for size. High right paratracheal node measures 9 mm in short axis on image 17 of series 2. Unremarkable thoracic esophagus.  Heart/Vascular: Limited evaluation in the absence of intravenous contrast. Conventional 3 vessel arch anatomy. Scattered atherosclerotic calcifications including coronary artery disease. Evidence of prior multivessel  CABG. The heart is enlarged. No pericardial effusion.  Lungs/Pleura: Focal calcified pleural plaques in the posteromedial aspect of the left lower thorax and at the right lung apex. Small and partially loculated left pleural effusion with pleural fluid extending through the fissure. Moderate right pleural effusion. Associated dependent atelectasis in the right greater than left lower lobes. No pulmonary edema, focal airspace consolidation or suspicious pulmonary nodule. Diffuse mild lower lobe bronchial wall thickening.  Bones/Soft Tissues: Chronic nonunion of the median sternotomy. The cerclage wires remain intact. No acute fracture or aggressive appearing lytic or blastic osseous lesion.  Upper Abdomen: Visualized upper abdominal organs are unremarkable.  IMPRESSION: 1. Moderate right and small low but loculated left pleural effusions. No definitive etiology for the pleural fluid is identified by CT imaging. Given the partial loculation on the left, chronic effusions perhaps related to prior episodes of congestive heart failure is favored. 2. Associated chronic right greater than left lower lobe atelectasis. 3. 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 This may predispose to chronic pleural effusion formation. 4.  Atherosclerosis including coronary artery disease. 5. Chronic nonunion of the median sternotomy. Cerclage wires remain intact. 6. Diffuse mild lower lobe bronchial wall thickening.   Electronically Signed   By: Malachy MoanHeath  McCullough M.D.   On: 06/14/2014 19:34   Mr Hip Right Wo Contrast  06/24/2014   CLINICAL DATA:  RIGHT hip pain.  Recent fall.  Occult fracture.  EXAM: MR OF THE RIGHT HIP WITHOUT CONTRAST  TECHNIQUE: Multiplanar, multisequence MR imaging was performed. No intravenous contrast was administered.  COMPARISON:  Radiographs 06/23/2014.  FINDINGS: Nondisplaced RIGHT obturator ring fractures are present. Fracture in the mid RIGHT inferior  pubic ramus. There is also a fracture of the root of the RIGHT superior pubic ramus. Additionally, there is a complementary RIGHT sacral ala fracture. All of these fractures are nondisplaced and or radiographically occult. Anasarca is present with diffuse subcutaneous edema. LEFT gluteal muscular edema compatible with strain. There is also bilateral RIGHT-greater-than-LEFT adductor compartment strain and reactive edema. Gluteal tendons appear intact. Edema is present in the RIGHT obturator internus and externus consistent with strain and reactive edema from adjacent fractures.  There is no proximal femur fracture. No hip effusion. Negative for bursitis. LEFT inguinal hernia contains a portion of the sigmoid colon.  IMPRESSION: 1. Nondisplaced RIGHT obturator ring fractures. Complementary nondisplaced RIGHT sacral ala fracture. 2. Bilateral hip girdle muscular strain. 3. Anasarca up. 4. LEFT inguinal hernia containing a loop of sigmoid colon.   Electronically Signed   By: Andreas NewportGeoffrey  Lamke M.D.   On: 06/24/2014 08:26   Nm Pulmonary Perf And Vent  06/26/2014   CLINICAL DATA:  Respiratory distress  EXAM: NUCLEAR MEDICINE VENTILATION - PERFUSION LUNG SCAN  TECHNIQUE: Ventilation images were obtained in multiple projections using inhaled aerosol technetium 99 M DTPA. Perfusion images were obtained in multiple projections after intravenous injection of Tc-2478m MAA.  RADIOPHARMACEUTICALS:  Fifty mCi Tc-8478m DTPA aerosol and 5.5 mCi Tc-6078m MAA  COMPARISON:  Chest x-ray from the same day.  FINDINGS: There are 2 peripheral and wedge-shaped defects in the right lower lobe, 1 large and the other moderate, located in the posterior and apical segments respectively. The largest is triple matched based on chest x-ray from today; the ventilation defect is larger in the apical defect. Poor ventilation in the left lower lobe, with better perfusion, correlating with pleural fluid and airspace disease on preceding chest x-ray. There  could be a small mismatched defect in the lingula based on left lateral imaging. The lung contours are altered in the setting of bilateral pleural effusions.  IMPRESSION: Intermediate probability for pulmonary embolism by PIOPED II. Bibasilar airspace disease and pleural effusions significantly limits the utility of V/Q scan.   Electronically Signed   By: Marnee SpringJonathon  Watts M.D.   On: 06/26/2014 12:00   Dg Chest Port 1 View  06/26/2014   CLINICAL DATA:  Dyspnea  EXAM: PORTABLE CHEST - 1 VIEW  COMPARISON:  06/25/2014  FINDINGS: There is unchanged cardiomegaly. There is improvement, with less confluent consolidation in the lateral left lung although ground-glass opacities persist bilaterally. There is no change in the moderate right pleural effusion.  IMPRESSION: Slight improvement in the left lung consolidation. Persistent edema and right pleural effusion.   Electronically Signed   By: Ellery Plunkaniel R Mitchell M.D.   On: 06/26/2014 06:14   Dg Chest Port 1 View  06/25/2014   CLINICAL DATA:  Patient with shortness of breath. History of coronary artery disease and hypertension.  EXAM: PORTABLE CHEST - 1 VIEW  COMPARISON:  06/24/2014  FINDINGS: Multiple monitoring leads overlie the patient. The left costophrenic angle is excluded from view. Stable enlarged cardiac and mediastinal contours status post median sternotomy and CABG procedure. Bilateral mid and lower lung heterogeneous pulmonary opacities, left greater than right. Small to moderate right pleural effusion. Probable layering left pleural effusion, although the left lung base is excluded from view. No definite pneumothorax.  IMPRESSION: Bilateral mid to lower lung heterogeneous opacities, left greater than right, with associated moderate right and probable small left pleural effusions. Findings may represent pulmonary edema, infection or underlying atelectasis.   Electronically Signed   By: Annia Belt M.D.   On: 06/25/2014 12:20   Dg Chest Port 1 View  06/24/2014    CLINICAL DATA:  Congestive heart failure. Coronary artery disease. COPD. Hypertension. Atrial flutter and fibrillation.  EXAM: PORTABLE CHEST - 1 VIEW  COMPARISON:  06/14/2014  FINDINGS: Moderate cardiomegaly is stable. Small pleural effusions are seen bilaterally which has decreased in size since previous study. Decreased atelectasis also noted in the right lung base. Prior CABG again noted.  IMPRESSION: Decreased small bilateral pleural effusions and right basilar atelectasis.  Stable cardiomegaly.   Electronically Signed   By: Myles Rosenthal M.D.   On: 06/24/2014 17:08   Dg Hip Unilat  With Pelvis 2-3 Views Right  06/23/2014   CLINICAL DATA:  Status post fall today while getting out of bed; right hip pain  EXAM: RIGHT HIP (WITH PELVIS) 2-3 VIEWS  COMPARISON:  Supine abdominal film of January 04, 2014 which includes a portion of the hips  FINDINGS: The bony pelvis is osteopenic. No acute pelvic fracture is demonstrated. The hip joint spaces are preserved. The observed portions of the sacrum are normal. There are common iliac stents bilaterally.  AP and frog-leg lateral views of the right hip reveal the joint space to be normal. The acetabulum is intact. The femoral head, neck, and intertrochanteric regions are normal in appearance.  IMPRESSION: There is no acute bony abnormality of the right hip.   Electronically Signed   By: David  [place]   On: 06/23/2014 14:05      ASSESSMENT: Nathan [name]:  AFib: Continue Coreg.  Add Diltiazem 30 mg QID.  Hesitant to increase beta blocker due to chronic wheezing.  Will follow.  PE: Coumadin for this will also be helpful to decrease risk of stroke.   Corky Crafts, MD  06/28/2014  6:47 PM

## 2014-06-28 NOTE — Progress Notes (Signed)
OT Cancellation Note  Patient Details Name: Allen KellMaurice W Cheadle MRN: [id number] DOB: 06/02/1940   Cancelled Treatment:    Reason Eval/Treat Not Completed: Medical issues which prohibited therapy (Pt with HR of 120 at rest. Will continue to follow.)  Evern BioMayberry, Nathali Vent Lynn 06/28/2014, 10:10 AM

## 2014-06-28 NOTE — Progress Notes (Signed)
Patient ID: Nathan [name], male   DOB: [birth year], 74 y.o.   MRN: [id number]  [facility] PROGRESS NOTE  Nathan [name] WUJ:[id number] DOB: December 04, 1940 DOA: 06/23/2014 PCP: Lillia Mountain, MD   Brief narrative:    74 y.o. male with extensive CAD, COPD with ongoing tobacco abuse, chronic CHF, HTN, peripheral vascular disease, GERD, CKD stage III, recently hospitalized from 06/14/2014 to 06/18/2014 for an acute COPD exacerbation, now presented back to [facility] ED on 2/12 after an episode of fall that occurred several hours prior to this admission while he was trying to go to the bathroom. He stood up and his right leg gave away and he fell on his right side, subsequently unable to stand up and bear weight.   In ED, pt was hemodynamically stable, right hip diagnostic studies negative for fractures or dislocations. Blood work notable for Cr 1.61, WBC 11K. Pt admitted for further evaluation due to ongoing hip pain.   Major events since admission: 2/13 - MRI right hip with non displaced ring obturator fracture, conservative mngt per ortho  2/14 - dyspnea, lasix started as CXR with more vascular congestion, transferred to SDU  2/15 - VQ scan with intermediate prob PE, heparin drip started with Coumadin bridging  2/17 - developed sudden onset a-fib   Assessment/Plan:    Acute respiratory failure with hypoxia  - appears to be multifactorial in etiology, secondary to combines systolic and diastolic CHF, PE - on lasix 60 mg IV BID, weight is trending down: 187 lbs 2/14 --> 179 lbs this AM - maintaining good urine output, possible transition to PO Lasix in AM - continue Heparin/ Coumadin per pharmacy  A-fib with RVR - developed AM of 2/17  - possibly triggered by duonebs - will change albuterol to levalbuterol and see if pt tolerates better - will give one dose of Metoprolol 5 mg IV and monitor clinical response   Acute on chronic combined systolic and diastolic heart failure - repeat 2  D ECHO this admission with EF 40% - possible transition to oral Lasix in AM - weight is trending down as noted above  - Continue Coreg, Aldactone, Imdur  Intermediate probability of pulmonary emboli - continue Heparin with transition to Coumadin per pharmacy - will need 6 months therapy   Right obturator ring fracture/right sacral and left fractures/bilateral hip girdle muscular strain/Right hip pain - nondisplaced right-sided pelvic ring disruption  - nonweightbearing right lower extremity, okay to touch touchdown weightbearing  - should be a self-limited problem  - per ortho rec's, will need follow-up with Dr. August Saucer in 3 weeks for repeat radiographs to make sure everything is healing  Mechanical fall - PT/OT  COPD Gold stage III - no wheezing on exam this AM - change albuterol to levalbuterol due to sudden onset of a-fib this AM - provide oxygen via [employer]   Coronary artery disease status post CABG/acute on chronic combined systolic and diastolic heart failure - continue Coreg, Aldactone, Imdur, Lipitor  Chronic kidney disease stage III - Cr slowly trending down: 2.11 --> 1.97, monitor closely and repeat BMP in AM  Leukocytosis - determined to be secondary to reactive process  - WBC is trending down: 16 --> 14.7 --> 13.5 - will repeat CXR in AM to make sure no developing PNA - pt denies chest pain or shortness of breath this AM, no fevers over the past 24 hours   Tobacco abuse - provide nicotine patch   Thrombocytopenia - no signs of active bleeding -  CBC in AM  Hypertension - reasonable inpatient control but slightly soft this AM  Ear lobe laceration Status post repair. ED physician.  Prophylaxis - PPI for GI prophylaxis. Full dose heparin drip for PE already started   Code Status: Full.  Family Communication:  plan of care discussed with the patient Disposition Plan: Holding off on d/c as pt developed a-fib this AM  IV access:  Peripheral IV  Procedures and  diagnostic studies:      Nm Pulmonary Perf/Vent  06/26/2014  Intermediate probability for pulmonary embolism by PIOPED II. Bibasilar airspace disease and pleural effusions significantly limits the utility of V/Q scan.     Dg Chest Port 1 View  06/26/2014 Slight improvement in the left lung consolidation. Persistent edema and right pleural effusion.     Dg Chest Port 1 View  06/25/2014   Bilateral mid to lower lung heterogeneous opacities, left greater than right, with associated moderate right and probable small left pleural effusions. Findings may represent pulmonary edema, infection or underlying atelectasis.    Dg Chest Port 1 View  06/24/2014  Decreased small bilateral pleural effusions and right basilar atelectasis.  Stable cardiomegaly.    Mr Hip Right Wo Contrast  06/24/2014   Nondisplaced RIGHT obturator ring fractures. Complementary nondisplaced RIGHT sacral ala fracture.  Bilateral hip girdle muscular strain. Anasarca up.  LEFT inguinal hernia containing a loop of sigmoid colon.    CXR  06/14/2014   Moderate right and small left pleural effusions, loculated left pleural effusion increased since 06/06/2014.   Dg Hip Unilat  06/23/2014   There is no acute bony abnormality of the right hip.    Ct Head Wo Contrast  06/23/2014  Atrophy and microvascular ischemic disease without acute intracranial process.    Medical Consultants:  Cardiology  Orthopedics   Other Consultants:  None  IAnti-Infectives:   None  Nathan PrestoMAGICK-Nathan Follett, MD  [facility] Pager [phone]  If 7PM-7AM, please contact night-coverage [email or web address] Password [email or web address] 06/28/2014, 3:42 PM   LOS: 4 days   HPI/Subjective: No events overnight.   Objective: Filed Vitals:   06/28/14 0500 06/28/14 0557 06/28/14 0903 06/28/14 1406  BP:  138/74  108/56  Pulse:  87  96  Temp:  98.4 F (36.9 C)  98.1 F (36.7 C)  TempSrc:  Oral  Oral  Resp:  20  18  Height:      Weight: 81.4 kg (179 lb 7.3 oz)     SpO2:  99% 95% 93%     Intake/Output Summary (Last 24 hours) at 06/28/14 1542 Last data filed at 06/28/14 1406  Gross per 24 hour  Intake    840 ml  Output   2500 ml  Net  -1660 ml    Exam:   General:  Pt is alert, follows commands appropriately, not in acute distress  Cardiovascular: Irregular rate and rhythm, no rubs, no gallops  Respiratory: Clear to auscultation bilaterally, no wheezing, diminished breath sounds at bases   Abdomen: Soft, non tender, non distended, bowel sounds present, no guarding  Extremities: LE pitting edema, pulses DP and PT palpable bilaterally  Neuro: Grossly nonfocal  Data Reviewed: Basic Metabolic Panel:  Recent Labs Lab 06/23/14 1412  06/24/14 0500 06/25/14 0201 06/26/14 0357 06/27/14 0450 06/28/14 0200  NA  --   < > 140 137 133* 137 134*  K  --   < > 3.9 3.9 4.6 4.0 4.4  CL  --   < > 102 100 96 99 96  CO2  --   < >  33* GLUCOSE  --   < > 102* 127* 115* 121* 108*  BUN  --   < > 32* 34* 41* 48* 47*  CREATININE  --   < > 1.90* 1.96* 2.11* 1.93* 1.97*  CALCIUM  --   < > 8.1* 8.0* 7.8* 8.3* 8.2*  MG 2.1  --   --   --   --  2.1  --   < > = values in this interval not displayed. Liver Function Tests:  Recent Labs Lab 06/23/14 1413  AST 28  ALT 76*  ALKPHOS 53  BILITOT 0.7  PROT 5.5*  ALBUMIN 3.0*   CBC:  Recent Labs Lab 06/23/14 1413 06/24/14 0500 06/25/14 0201 06/26/14 0357 06/27/14 0450 06/28/14 0200  WBC 11.2* 10.3 10.8* 16.0* 14.7* 13.5*  NEUTROABS 9.9*  --  9.2* 14.8*  --   --   HGB 12.4* 12.5* 12.5* 12.5* 11.4* 11.2*  HCT 41.3 41.1 41.0 40.2 37.0* 36.6*  MCV 85.3 84.6 84.0 82.7 82.6 82.8  PLT 142* 147* 136* 101* 112* 108*   Cardiac Enzymes:  Recent Labs Lab 06/24/14 1419 06/24/14 2006 06/25/14 0201  TROPONINI 0.06* 0.06* 0.05*   Recent Results (from the past 240 hour(s))  Culture, blood (routine x 2)     Status: None (Preliminary result)   Collection Time: 06/26/14  8:20 AM  Result Value Ref Range Status    Specimen Description BLOOD LEFT HAND  Final   Special Requests BOTTLES DRAWN AEROBIC ONLY 3CC  Final   Culture   Final           BLOOD CULTURE RECEIVED NO GROWTH TO DATE CULTURE WILL BE HELD FOR 5 DAYS BEFORE ISSUING A FINAL NEGATIVE REPORT Performed at [facility]    Report Status PENDING  Incomplete  Culture, blood (routine x 2)     Status: None (Preliminary result)   Collection Time: 06/26/14  8:23 AM  Result Value Ref Range Status   Specimen Description BLOOD LEFT ARM  Final   Special Requests BOTTLES DRAWN AEROBIC AND ANAEROBIC 10CC  Final   Culture   Final           BLOOD CULTURE RECEIVED NO GROWTH TO DATE CULTURE WILL BE HELD FOR 5 DAYS BEFORE ISSUING A FINAL NEGATIVE REPORT Performed at [facility]    Report Status PENDING  Incomplete  Culture, Urine     Status: None   Collection Time: 06/26/14  3:47 PM  Result Value Ref Range Status   Specimen Description URINE, CATHETERIZED  Final   Special Requests NONE  Final   Colony Count NO GROWTH Performed at [facility]   Final   Culture NO GROWTH Performed at [facility]   Final   Report Status 06/27/2014 FINAL  Final     Scheduled Meds: . aspirin  81 mg Oral Daily  . atorvastatin  10 mg Oral q1800  . buPROPion  300 mg Oral Daily  . carvedilol  12.5 mg Oral BID WC  . colchicine  0.6 mg Oral Daily  . docusate sodium  100 mg Oral BID  . furosemide  60 mg Intravenous BID  . isosorbide mononitrate  15 mg Oral Daily  . levalbuterol  1.25 mg Nebulization 4 times per day  . mometasone-formoterol  2 puff Inhalation BID  . nicotine  21 mg Transdermal Daily  . pantoprazole  40 mg Oral Daily  . polyethylene glycol  17 g Oral Daily  .  potassium chloride  20 mEq Oral BID  . senna  1 tablet Oral BID  . sodium chloride  3 mL Intravenous Q12H  . spironolactone  25 mg Oral Daily  . warfarin  6 mg Oral ONCE-1800  . Warfarin - Pharmacist Dosing Inpatient   Does not apply q1800   Continuous  Infusions: . heparin 1,700 Units/hr (06/28/14 1318)

## 2014-06-28 NOTE — Progress Notes (Signed)
ANTICOAGULATION CONSULT NOTE - Follow Up Consult  Pharmacy Consult for Heparin/warfarin Indication:  pulmonary embolus  No Known Allergies  Patient Measurements: Height: 6\' 1"  (185.4 cm) Weight: 179 lb 7.3 oz (81.4 kg) IBW/kg (Calculated) : 79.9 Heparin Dosing Weight: actual weight  Vital Signs: Temp: 98.4 F (36.9 C) (02/17 0557) Temp Source: Oral (02/17 0557) BP: 138/74 mmHg (02/17 0557) Pulse Rate: 87 (02/17 0557)  Labs:  Recent Labs  06/26/14 0357 06/27/14 0450 06/27/14 1622 06/28/14 0200 06/28/14 1012  HGB 12.5* 11.4*  --  11.2*  --   HCT 40.2 37.0*  --  36.6*  --   PLT 101* 112*  --  108*  --   LABPROT  --   --   --  13.8  --   INR  --   --   --  1.05  --   HEPARINUNFRC 0.46 0.19* 0.17* 0.33 0.41  CREATININE 2.11* 1.93*  --  1.97*  --     Estimated Creatinine Clearance: 36.6 mL/min (by C-G formula based on Cr of 1.97).   Medications:  Infusions:  . heparin 1,700 Units/hr (06/27/14 2119)    Assessment: 74 yo male admitted 06/23/14 s/p fall. He was recently admitted 2/3-06/18/14 for an acute COPD exacerbation. On 06/25/14, he developed acute respiratory distress, pO2 below reportable range and elevated D-dimer. Prophylactic Lovenox (last dose 2/13 ~2200) was discontinued. PLTs noted to be chronically low. VQ scan has returned with intermediate probability for PE and LE dopplers negative for DVT. Pharmacy consulted to dose IV heparin & warfarin for PE.   2/13 baseline INR = 1.32   Today, 06/28/2014:  Heparin level 0.41, increased to therapeutic range overnight on heparin at 17.0 ml/hr.  Heparin infusing at ordered rate and RN states no issues with infusion  INR 1.05, SUBtherapeutic following warfarin 5mg  x1   CBC: Hgb 11.2 (stable from 06/27/14), PLT 108 (chronically low and decreased)  No active bleeding noted   Diet: Cardiac - intake appears poor  Drug-Drug interactions with warfarin: NONE (on ASA 81mg  with h/o CAD)  SCr elevated with h/o CKD - DOACs  not preferred  SCr today is 1.97mg /dL and CrCl ~78~36 mL/min  Goal of Therapy:  Heparin level 0.3-0.7 units/ml Monitor platelets by anticoagulation protocol: Yes   Plan:  Day #2 warfarin/heparin VTE overlap  Continue heparin IV infusion at 1700 units/hr (17.0 ml/hr)  Increase warfarin dose to 6mg  PO x 1 tonight  Daily INR, heparin level and CBC  Continue to monitor H&H and platelets  Pharmacy to provide Coumadin education  Thomasene Rippleommi Cooke, PharmD Candidate  Juliette Alcideustin Sierra Spargo, PharmD, BCPS.   Pager: [phone] 06/28/2014 12:10 PM

## 2014-06-28 NOTE — Progress Notes (Signed)
PT Cancellation Note  Patient Details Name: Nathan [name] MRN: [id number] DOB: 01/04/1941   Cancelled Treatment:    Reason Eval/Treat Not Completed: Medical issues which prohibited therapy (SV tach per telemetry monitoring this moring and currently, will check back as schedule permits.)   Lailoni Baquera,KATHrine E 06/28/2014, 2:15 PM Zenovia JarredKati Kennethia Lynes, PT, DPT 06/28/2014 Pager: [phone]

## 2014-06-29 DIAGNOSIS — Z951 Presence of aortocoronary bypass graft: Secondary | ICD-10-CM

## 2014-06-29 DIAGNOSIS — J449 Chronic obstructive pulmonary disease, unspecified: Secondary | ICD-10-CM

## 2014-06-29 DIAGNOSIS — I5042 Chronic combined systolic (congestive) and diastolic (congestive) heart failure: Secondary | ICD-10-CM

## 2014-06-29 LAB — BASIC METABOLIC PANEL
Anion gap: 12 (ref 5–15)
BUN: 63 mg/dL — ABNORMAL HIGH (ref 6–23)
CO2: 28 mmol/L (ref 19–32)
CREATININE: 2.02 mg/dL — AB (ref 0.50–1.35)
Calcium: 8.5 mg/dL (ref 8.4–10.5)
Chloride: 94 mmol/L — ABNORMAL LOW (ref 96–112)
GFR calc Af Amer: 35 mL/min — ABNORMAL LOW (ref >90)
GFR, EST NON AFRICAN AMERICAN: 31 mL/min — AB (ref >90)
Glucose, Bld: 122 mg/dL — ABNORMAL HIGH (ref 70–99)
Potassium: 4.6 mmol/L (ref 3.5–5.1)
SODIUM: 134 mmol/L — AB (ref 135–145)

## 2014-06-29 LAB — CBC
HCT: 36.1 % — ABNORMAL LOW (ref 39.0–52.0)
Hemoglobin: 11 g/dL — ABNORMAL LOW (ref 13.0–17.0)
MCH: 25.1 pg — AB (ref 26.0–34.0)
MCHC: 30.5 g/dL (ref 30.0–36.0)
MCV: 82.2 fL (ref 78.0–100.0)
PLATELETS: 107 10*3/uL — AB (ref 150–400)
RBC: 4.39 MIL/uL (ref 4.22–5.81)
RDW: 16.8 % — AB (ref 11.5–15.5)
WBC: 9.6 10*3/uL (ref 4.0–10.5)

## 2014-06-29 LAB — HEPARIN LEVEL (UNFRACTIONATED): HEPARIN UNFRACTIONATED: 0.28 [IU]/mL — AB (ref 0.30–0.70)

## 2014-06-29 LAB — PROTIME-INR
INR: 1.59 — AB (ref 0.00–1.49)
PROTHROMBIN TIME: 19.1 s — AB (ref 11.6–15.2)

## 2014-06-29 MED ORDER — HEPARIN (PORCINE) IN NACL 100-0.45 UNIT/ML-% IJ SOLN
1800.0000 [IU]/h | INTRAMUSCULAR | Status: DC
  Administered 2014-06-29 – 2014-06-30 (×2): 1800 [IU]/h via INTRAVENOUS
  Filled 2014-06-29 (×3): qty 250

## 2014-06-29 MED ORDER — WARFARIN SODIUM 4 MG PO TABS
4.0000 mg | ORAL_TABLET | Freq: Once | ORAL | Status: AC
  Administered 2014-06-29: 4 mg via ORAL
  Filled 2014-06-29: qty 1

## 2014-06-29 NOTE — Progress Notes (Signed)
Patient ID: Nathan [name], male   DOB: 06/20/1940, 74 y.o.   MRN: [id number]  [facility] PROGRESS NOTE  HARLESS MOLINARI WUJ:[id number] DOB: [birth year] DOA: 06/23/2014 PCP: Lillia Mountain, MD   Brief narrative:    74 y.o. male with extensive CAD, COPD with ongoing tobacco abuse, chronic CHF, HTN, peripheral vascular disease, GERD, CKD stage III, recently hospitalized from 06/14/2014 to 06/18/2014 for an acute COPD exacerbation, now presented back to [facility] ED on 2/12 after an episode of fall that occurred several hours prior to this admission while he was trying to go to the bathroom. He stood up and his right leg gave away and he fell on his right side, subsequently unable to stand up and bear weight.   In ED, pt was hemodynamically stable, right hip diagnostic studies negative for fractures or dislocations. Blood work notable for Cr 1.61, WBC 11K. Pt admitted for further evaluation due to ongoing hip pain.   Major events since admission: 2/13 - MRI right hip with non displaced ring obturator fracture, conservative mngt per ortho  2/14 - dyspnea, lasix started as CXR with more vascular congestion, transferred to SDU  2/15 - VQ scan with intermediate prob PE, heparin drip started with Coumadin bridging  2/17 - developed sudden onset a-fib   Assessment/Plan:    Acute respiratory failure with hypoxia  - appears to be multifactorial in etiology, secondary to combines systolic and diastolic CHF, PE - on lasix 60 mg IV BID, weight is trending down: 187 lbs 2/14 --> 179 --> 175 lbs this AM - maintaining good urine output, possible transition to PO Lasix in AM - continue Heparin/ Coumadin per pharmacy, INR 1.59  A-fib with RVR - developed AM of 2/17  - possibly triggered by duonebs - changed albuterol to levalbuterol, rate is now better controlled  - cardiology re consulted   Acute on chronic combined systolic and diastolic heart failure - repeat 2 D ECHO this admission with  EF 40% - transitioned to oral Lasix 2/17 and weight continues trending down - still with significant LE edema  - Continue Coreg, Aldactone, Imdur  Intermediate probability of pulmonary emboli - continue Heparin with transition to Coumadin per pharmacy - will need 6 months therapy   Right obturator ring fracture/right sacral and left fractures/bilateral hip girdle muscular strain/Right hip pain - nondisplaced right-sided pelvic ring disruption  - nonweightbearing right lower extremity, okay to touch touchdown weightbearing  - should be a self-limited problem  - per ortho rec's, will need follow-up with Dr. August Saucer in 3 weeks for repeat radiographs to make sure everything is healing  Mechanical fall - PT/OT  COPD Gold stage III - no wheezing on exam this AM - change albuterol to levalbuterol due to sudden onset of a-fib this AM - provide oxygen via [place]   Coronary artery disease status post CABG/acute on chronic combined systolic and diastolic heart failure - continue Coreg, Aldactone, Imdur, Lipitor  Chronic kidney disease stage III - Cr trending: 2.11 --> 1.97 --> 2.02, monitor closely and repeat BMP in AM  Leukocytosis - determined to be secondary to reactive process  - WBC is trending down: 16 --> 14.7 --> 13.5 --> 9.6 - pt denies chest pain or shortness of breath this AM, no fevers over the past 24 hours   Tobacco abuse - provide nicotine patch   Thrombocytopenia - no signs of active bleeding - CBC in AM  Hypertension - reasonable inpatient control but slightly soft this AM  Ear  lobe laceration - Status post repair  Prophylaxis - PPI for GI prophylaxis. Full dose heparin drip for PE already started   Code Status: Full.  Family Communication: plan of care discussed with the patient Disposition Plan: Holding off on d/c, INR still sub therapeutic   IV access:  Peripheral IV  Procedures and diagnostic studies:    Nm Pulmonary Perf/Vent 06/26/2014  Intermediate probability for pulmonary embolism by PIOPED II. Bibasilar airspace disease and pleural effusions significantly limits the utility of V/Q scan.   Dg Chest Port 1 View 06/26/2014 Slight improvement in the left lung consolidation. Persistent edema and right pleural effusion.   Dg Chest Port 1 View 06/25/2014 Bilateral mid to lower lung heterogeneous opacities, left greater than right, with associated moderate right and probable small left pleural effusions. Findings may represent pulmonary edema, infection or underlying atelectasis.   Dg Chest Port 1 View 06/24/2014 Decreased small bilateral pleural effusions and right basilar atelectasis. Stable cardiomegaly.   Mr Hip Right Wo Contrast 06/24/2014 Nondisplaced RIGHT obturator ring fractures. Complementary nondisplaced RIGHT sacral ala fracture. Bilateral hip girdle muscular strain. Anasarca up. LEFT inguinal hernia containing a loop of sigmoid colon.   CXR 06/14/2014 Moderate right and small left pleural effusions, loculated left pleural effusion increased since 06/06/2014.   Dg Hip Unilat 06/23/2014 There is no acute bony abnormality of the right hip.   Ct Head Wo Contrast 06/23/2014 Atrophy and microvascular ischemic disease without acute intracranial process.   Medical Consultants:  Cardiology  Orthopedics   Other Consultants:  None  IAnti-Infectives:   None     Debbora Presto, MD  [facility] Pager [phone]  If 7PM-7AM, please contact night-coverage [email or web address] Password [facility] 06/29/2014, 3:59 PM   LOS: 5 days   HPI/Subjective: No events overnight.   Objective: Filed Vitals:   06/29/14 0248 06/29/14 0436 06/29/14 0924 06/29/14 1329  BP:  116/64  116/74  Pulse:  91  80  Temp:  98.2 F (36.8 C)  97.7 F (36.5 C)  TempSrc:  Oral  Oral  Resp:  20  18  Height:      Weight:  79.7 kg (175 lb 11.3 oz)    SpO2: 95% 95% 96% 96%    Intake/Output Summary (Last 24 hours) at 06/29/14  1559 Last data filed at 06/29/14 1329  Gross per 24 hour  Intake    480 ml  Output   1350 ml  Net   -870 ml    Exam:   General:  Pt is alert, follows commands appropriately, not in acute distress  Cardiovascular: Irregular rate and rhythm, no rubs, no gallops  Respiratory: Clear to auscultation bilaterally, no wheezing, no crackles, no rhonchi  Abdomen: Soft, non tender, non distended, bowel sounds present, no guarding  Extremities: +2 bilateral LE edema, pulses DP and PT palpable bilaterally  Neuro: Grossly nonfocal  Data Reviewed: Basic Metabolic Panel:  Recent Labs Lab 06/23/14 1412  06/25/14 0201 06/26/14 0357 06/27/14 0450 06/28/14 0200 06/29/14 0445  NA  --   < > 137 133* 137 134* 134*  K  --   < > 3.9 4.6 4.0 4.4 4.6  CL  --   < > 100 96 99 96 94*  CO2  --   < > GLUCOSE  --   < > 127* 115* 121* 108* 122*  BUN  --   < > 34* 41* 48* 47* 63*  CREATININE  --   < > 1.96* 2.11* 1.93* 1.97*  2.02*  CALCIUM  --   < > 8.0* 7.8* 8.3* 8.2* 8.5  MG 2.1  --   --   --  2.1  --   --   < > = values in this interval not displayed. Liver Function Tests:  Recent Labs Lab 06/23/14 1413  AST 28  ALT 76*  ALKPHOS 53  BILITOT 0.7  PROT 5.5*  ALBUMIN 3.0*   CBC:  Recent Labs Lab 06/23/14 1413  06/25/14 0201 06/26/14 0357 06/27/14 0450 06/28/14 0200 06/29/14 0445  WBC 11.2*  < > 10.8* 16.0* 14.7* 13.5* 9.6  NEUTROABS 9.9*  --  9.2* 14.8*  --   --   --   HGB 12.4*  < > 12.5* 12.5* 11.4* 11.2* 11.0*  HCT 41.3  < > 41.0 40.2 37.0* 36.6* 36.1*  MCV 85.3  < > 84.0 82.7 82.6 82.8 82.2  PLT 142*  < > 136* 101* 112* 108* 107*  < > = values in this interval not displayed. Cardiac Enzymes:  Recent Labs Lab 06/24/14 1419 06/24/14 2006 06/25/14 0201  TROPONINI 0.06* 0.06* 0.05*   Recent Results (from the past 240 hour(s))  Culture, blood (routine x 2)     Status: None (Preliminary result)   Collection Time: 06/26/14  8:20 AM  Result Value Ref  Range Status   Specimen Description BLOOD LEFT HAND  Final   Special Requests BOTTLES DRAWN AEROBIC ONLY 3CC  Final   Culture   Final           BLOOD CULTURE RECEIVED NO GROWTH TO DATE CULTURE WILL BE HELD FOR 5 DAYS BEFORE ISSUING A FINAL NEGATIVE REPORT Performed at [facility]    Report Status PENDING  Incomplete  Culture, blood (routine x 2)     Status: None (Preliminary result)   Collection Time: 06/26/14  8:23 AM  Result Value Ref Range Status   Specimen Description BLOOD LEFT ARM  Final   Special Requests BOTTLES DRAWN AEROBIC AND ANAEROBIC 10CC  Final   Culture   Final           BLOOD CULTURE RECEIVED NO GROWTH TO DATE CULTURE WILL BE HELD FOR 5 DAYS BEFORE ISSUING A FINAL NEGATIVE REPORT Performed at [facility]    Report Status PENDING  Incomplete  Culture, Urine     Status: None   Collection Time: 06/26/14  3:47 PM  Result Value Ref Range Status   Specimen Description URINE, CATHETERIZED  Final   Special Requests NONE  Final   Colony Count NO GROWTH Performed at [facility]   Final   Culture NO GROWTH Performed at [facility]   Final   Report Status 06/27/2014 FINAL  Final     Scheduled Meds: . aspirin  81 mg Oral Daily  . buPROPion  300 mg Oral Daily  . carvedilol  12.5 mg Oral BID WC  . colchicine  0.6 mg Oral Daily  . diltiazem  30 mg Oral QID  . docusate sodium  100 mg Oral BID  . furosemide  60 mg Oral BID  . isosorbide mononitrate  15 mg Oral Daily  . levalbuterol  1.25 mg Nebulization 4 times per day  . mometasone-formoterol  2 puff Inhalation BID  . nicotine  21 mg Transdermal Daily  . pantoprazole  40 mg Oral Daily  . polyethylene glycol  17 g Oral Daily  . potassium chloride  20 mEq Oral BID  . rosuvastatin  5 mg Oral q1800  .  senna  1 tablet Oral BID  . sodium chloride  3 mL Intravenous Q12H  . spironolactone  25 mg Oral Daily  . warfarin  4 mg Oral ONCE-1800  . Warfarin - Pharmacist Dosing Inpatient   Does  not apply q1800   Continuous Infusions: . heparin 1,800 Units/hr (06/29/14 0915)

## 2014-06-29 NOTE — Progress Notes (Signed)
PROGRESS NOTE  Subjective:   Nathan [name] is a 74 y.o. male with history of CAD s/p CABG, AFL s/p ablation (details unclear),PAF (last documented 04/2012 - not on [facility]), chronic combined heart failure, hypertension, severe COPD with ongoing tobacco use previously on home O2, CKD (baseline Cr 1.5-1.7), stroke, GERD, PAD (LE stenting 2005, R CEA 2013), and medication noncompliance.  He fell last week and suffered a pelvic fracture.  He has had progressive dyspnea and has had a markely elevated D-dimer.    He has been empirically treated with IV heparin.   VQ scan done yesterday was intermediate probability.   Showed 2 peripheral wedge shaped defects in the RLL   Objective:    Vital Signs:   Temp:  [98.1 F (36.7 C)-98.4 F (36.9 C)] 98.2 F (36.8 C) (02/18 0436) Pulse Rate:  [88-96] 91 (02/18 0436) Resp:  [18-20] 20 (02/18 0436) BP: (108-116)/(56-70) 116/64 mmHg (02/18 0436) SpO2:  [93 %-98 %] 95 % (02/18 0436) Weight:  [175 lb 11.3 oz (79.7 kg)] 175 lb 11.3 oz (79.7 kg) (02/18 0436)  Last BM Date: 06/23/14   24-hour weight change: Weight change: -3 lb 12 oz (-1.7 kg)  Weight trends: Filed Weights   06/27/14 0510 06/28/14 0500 06/29/14 0436  Weight: 181 lb 3.5 oz (82.2 kg) 179 lb 7.3 oz (81.4 kg) 175 lb 11.3 oz (79.7 kg)    Intake/Output:  02/17 0701 - 02/18 0700 In: 480 [P.O.:480] Out: 1750 [Urine:1750]     Physical Exam: BP 116/64 mmHg  Pulse 91  Temp(Src) 98.2 F (36.8 C) (Oral)  Resp 20  Ht  (1.854 m)  Wt 175 lb 11.3 oz (79.7 kg)  BMI 23.19 kg/m2  SpO2 95%  Wt Readings from Last 3 Encounters:  06/29/14 175 lb 11.3 oz (79.7 kg)  06/23/14 186 lb (84.369 kg)  06/20/14 200 lb (90.719 kg)    General: Vital signs reviewed and noted.   Head: Normocephalic, atraumatic.  Eyes: conjunctivae/corneas clear.  EOM's intact.   Throat: normal  Neck:  normal   Lungs:     Rales, rhonchi  Heart:  irreg. Irreg.   Abdomen:  Soft, non-tender,  non-distended    Extremities: 2-3+ pitting edema .     Neurologic: A&O X3, CN II - XII are grossly intact.   Psych: Normal     Labs: BMET:  Recent Labs  06/27/14 0450 06/28/14 0200 06/29/14 0445  NA 137 134* 134*  K 4.0 4.4 4.6  CL 99 96 94*  CO2 GLUCOSE 121* 108* 122*  BUN 48* 47* 63*  CREATININE 1.93* 1.97* 2.02*  CALCIUM 8.3* 8.2* 8.5  MG 2.1  --   --     Liver function tests: No results for input(s): AST, ALT, ALKPHOS, BILITOT, PROT, ALBUMIN in the last 72 hours. No results for input(s): LIPASE, AMYLASE in the last 72 hours.  CBC:  Recent Labs  06/28/14 0200 06/29/14 0445  WBC 13.5* 9.6  HGB 11.2* 11.0*  HCT 36.6* 36.1*  MCV 82.8 82.2  PLT 108* 107*    Cardiac Enzymes: No results for input(s): CKTOTAL, CKMB, TROPONINI in the last 72 hours.  Coagulation Studies:  Recent Labs  06/28/14 0200 06/29/14 0445  LABPROT 13.8 19.1*  INR 1.05 1.59*    Other: Invalid input(s): POCBNP No results for input(s): DDIMER in the last 72 hours. No results for input(s): HGBA1C in the last 72 hours. No results for input(s): CHOL, HDL,  LDLCALC, TRIG, CHOLHDL in the last 72 hours. No results for input(s): TSH, T4TOTAL, T3FREE, THYROIDAB in the last 72 hours.  Invalid input(s): FREET3 No results for input(s): VITAMINB12, FOLATE, FERRITIN, TIBC, IRON, RETICCTPCT in the last 72 hours.   Other results:  EKG  ( personally reviewed )  -  Atrial fib with V rate of 80s -90s .     Medications:    Infusions: . heparin 1,700 Units/hr (06/29/14 0302)    Scheduled Medications: . aspirin  81 mg Oral Daily  . buPROPion  300 mg Oral Daily  . carvedilol  12.5 mg Oral BID WC  . colchicine  0.6 mg Oral Daily  . diltiazem  30 mg Oral QID  . docusate sodium  100 mg Oral BID  . furosemide  60 mg Oral BID  . isosorbide mononitrate  15 mg Oral Daily  . levalbuterol  1.25 mg Nebulization 4 times per day  . mometasone-formoterol  2 puff Inhalation BID  . nicotine   21 mg Transdermal Daily  . pantoprazole  40 mg Oral Daily  . polyethylene glycol  17 g Oral Daily  . potassium chloride  20 mEq Oral BID  . rosuvastatin  5 mg Oral q1800  . senna  1 tablet Oral BID  . sodium chloride  3 mL Intravenous Q12H  . spironolactone  25 mg Oral Daily  . Warfarin - Pharmacist Dosing Inpatient   Does not apply q1800    Assessment/ Plan:   Principal Problem:   Acute respiratory distress Active Problems:   COPD (chronic obstructive pulmonary disease), gold stage C.   Hyperlipidemia   S/P CABG (coronary artery bypass graft)   Chronic kidney disease (CKD), stage III (moderate)   Tobacco use disorder   PVD (peripheral vascular disease)   Essential hypertension   PAF (paroxysmal atrial fibrillation)   CAD (coronary artery disease)   Chronic combined systolic and diastolic CHF (congestive heart failure)   Anemia   Hypertensive heart disease   Right hip pain   Fall   Ear lobe laceration   Hip pain, acute   Acute hip pain   Acute on chronic systolic CHF (congestive heart failure)   Dislocation of hip, obturator, right, closed   Sacral fracture, closed   Pulmonary embolus: Probable  1.  Acute respiratory failure:  Related to his severe underlying COPD and pulmonary embolus.  Continue supportive care. He was still smoking when he came to the hosptial.    2.   pulmonary embolus:     He is on coumadin . INR is 1.59.  He may have his INR levels drawn by his primary medical doctor or may have them drawn in our coumadin clinic.   3. Atrial fibrillation:  Has new onset atrial fib.  This is likely related to his severe COPD and the pulmonary embolus.   His HR is well controlled.  Echo reveals mild LV dysfunction with EF of 40%.  He is not wheezing this am and seems to be tolerating the Coreg.  We can consider changing to Bisoprolol if he has wheezing.   I would avoid high doses of diltiazem because of his LV dysfunction  4. CAD :  His last cath was in Nov. 2013 that  shows: patent  LIMA to LAD, SVG -RCA.  The SVG - OM and the SVG - Diag were occluded.   His respiratory distress over the past several days is not consistent with an acute coronary syndrome  5. CHK -  stage III - plans per Int. Med.    6. Acute on chronic systolic  CHF :  Has improved with diuresis.  He has severe COPD and has significant leg edema.    He is on PO lasix and is diuresing well.     He needs leg elevation.    Disposition:  Length of Stay: 5  Vesta Mixer, Montez Hageman., MD, [facility] 06/29/2014, 7:34 AM Office [phone] Pager [phone]

## 2014-06-29 NOTE — Progress Notes (Signed)
Pt right ear scab continues to ooze blood through the night. Changed the dressing of right ear scab several times. Will cont to monitor. SRP, RN

## 2014-06-29 NOTE — Progress Notes (Addendum)
ANTICOAGULATION CONSULT NOTE - Follow Up Consult  Pharmacy Consult for Heparin/warfarin Indication:  pulmonary embolus  No Known Allergies  Patient Measurements: Height: 6\' 1"  (185.4 cm) Weight: 175 lb 11.3 oz (79.7 kg) IBW/kg (Calculated) : 79.9 Heparin Dosing Weight: actual weight  Vital Signs: Temp: 98.2 F (36.8 C) (02/18 0436) Temp Source: Oral (02/18 0436) BP: 116/64 mmHg (02/18 0436) Pulse Rate: 91 (02/18 0436)  Labs:  Recent Labs  06/27/14 0450  06/28/14 0200 06/28/14 1012 06/29/14 0445  HGB 11.4*  --  11.2*  --  11.0*  HCT 37.0*  --  36.6*  --  36.1*  PLT 112*  --  108*  --  107*  LABPROT  --   --  13.8  --  19.1*  INR  --   --  1.05  --  1.59*  HEPARINUNFRC 0.19*  < > 0.33 0.41 0.28*  CREATININE 1.93*  --  1.97*  --  2.02*  < > = values in this interval not displayed.  Estimated Creatinine Clearance: 35.6 mL/min (by C-G formula based on Cr of 2.02).   Medications:  Infusions:  . heparin 1,700 Units/hr (06/29/14 0302)    Assessment: 74 yo male admitted 06/23/14 s/p fall. He was recently admitted 2/3-06/18/14 for an acute COPD exacerbation. On 06/25/14, he developed acute respiratory distress, pO2 below reportable range and elevated D-dimer. Prophylactic Lovenox (last dose 2/13 ~2200) was discontinued. PLTs noted to be chronically low. VQ scan has returned with intermediate probability for PE and LE dopplers negative for DVT. Pharmacy consulted to dose IV heparin & warfarin for PE.   2/13 baseline INR = 1.32   Today, 06/29/2014:  Heparin level 0.28, fall to subtherapeutic level overnight on heparin at 17 ml/hr.  Heparin infusing at ordered rate and RN states no issues with infusion  INR 1.59, SUBtherapeutic but trending up with 5mg  and 6mg  doses   CBC: Hgb 11 (stable), PLT 107 (chronically low and decreased)  Blood oozing from wound on ear suffered during fall.  Gauze bandage s noted to be bloody.  RN states bleeding likely persisting d/t patient not  leaving it alone  Diet: Cardiac - intake appears poor  Drug-Drug interactions with warfarin: NONE (on ASA 81mg  with h/o CAD)  SCr elevated with h/o CKD - DOACs not preferred  Goal of Therapy:  Heparin level 0.3-0.7 units/ml Monitor platelets by anticoagulation protocol: Yes   Plan:  Day #3 warfarin/heparin VTE overlap  Increase heparin IV infusion at 1800 units/hr (18 ml/hr)  Give warfarin dose 4mg  PO x 1 tonight - small decrease in dose for INR rate of rise jumping up little faster than desire  Daily INR, heparin level and CBC  Continue to monitor H&H and platelets  Pharmacy to provide Coumadin education today  Education provided 2/18 but patient did not have strong interest in learning or reading material provided.  Patient was able to answer some of the questions during teach back.  Son was also present during education  PT has been unable to evaluate patient, if patient were to be discharge home, he would need assistance (ie. Home health) drawing INRs, etc. following fall and pelvic fracture   Juliette Alcideustin Alexavier Tsutsui, PharmD, BCPS.   Pager: [phone] 06/29/2014 7:47 AM

## 2014-06-29 NOTE — Discharge Instructions (Addendum)
Follow-up with Dr. August Saucer in 3 weeks at [facility] orthopedics   Barrier cream to sacral area.  Information on my medicine - Coumadin   (Warfarin)  This medication education was reviewed with me or my healthcare representative as part of my discharge preparation.  The pharmacist that spoke with me during my hospital stay was:  Dannielle Huh, [facility]  Why was Coumadin prescribed for you? Coumadin was prescribed for you because you have a blood clot or a medical condition that can cause an increased risk of forming blood clots. Blood clots can cause serious health problems by blocking the flow of blood to the heart, lung, or brain. Coumadin can prevent harmful blood clots from forming. As a reminder your indication for Coumadin is:   Pulmonary Embolism Treatment  What test will check on my response to Coumadin? While on Coumadin (warfarin) you will need to have an INR test regularly to ensure that your dose is keeping you in the desired range. The INR (international normalized ratio) number is calculated from the result of the laboratory test called prothrombin time (PT).  If an INR APPOINTMENT HAS NOT ALREADY BEEN MADE FOR YOU please schedule an appointment to have this lab work done by your health care provider within 7 days. Your INR goal is usually a number between:  2 to 3 or your provider may give you a more narrow range like 2-2.5.  Ask your health care provider during an office visit what your goal INR is.  What  do you need to  know  About  COUMADIN? Take Coumadin (warfarin) exactly as prescribed by your healthcare provider about the same time each day.  DO NOT stop taking without talking to the doctor who prescribed the medication.  Stopping without other blood clot prevention medication to take the place of Coumadin may increase your risk of developing a new clot or stroke.  Get refills before you run out.  What do you do if you miss a dose? If you miss a dose, take it as soon as  you remember on the same day then continue your regularly scheduled regimen the next day.  Do not take two doses of Coumadin at the same time.  Important Safety Information A possible side effect of Coumadin (Warfarin) is an increased risk of bleeding. You should call your healthcare provider right away if you experience any of the following: ? Bleeding from an injury or your nose that does not stop. ? Unusual colored urine (red or dark brown) or unusual colored stools (red or black). ? Unusual bruising for unknown reasons. ? A serious fall or if you hit your head (even if there is no bleeding).  Some foods or medicines interact with Coumadin (warfarin) and might alter your response to warfarin. To help avoid this: ? Eat a balanced diet, maintaining a consistent amount of Vitamin K. ? Notify your provider about major diet changes you plan to make. ? Avoid alcohol or limit your intake to 1 drink for women and 2 drinks for men per day. (1 drink is 5 oz. wine, 12 oz. beer, or 1.5 oz. liquor.)  Make sure that ANY health care provider who prescribes medication for you knows that you are taking Coumadin (warfarin).  Also make sure the healthcare provider who is monitoring your Coumadin knows when you have started a new medication including herbals and non-prescription products.  Coumadin (Warfarin)  Major Drug Interactions  Increased Warfarin Effect Decreased Warfarin Effect  Alcohol (large quantities)  Antibiotics (esp. Septra/Bactrim, Flagyl, Cipro) Amiodarone (Cordarone) Aspirin (ASA) Cimetidine (Tagamet) Megestrol (Megace) NSAIDs (ibuprofen, naproxen, etc.) Piroxicam (Feldene) Propafenone (Rythmol SR) Propranolol (Inderal) Isoniazid (INH) Posaconazole (Noxafil) Barbiturates (Phenobarbital) Carbamazepine (Tegretol) Chlordiazepoxide (Librium) Cholestyramine (Questran) Griseofulvin Oral Contraceptives Rifampin Sucralfate (Carafate) Vitamin K   Coumadin (Warfarin) Major Herbal  Interactions  Increased Warfarin Effect Decreased Warfarin Effect  Garlic Ginseng Ginkgo biloba Coenzyme Q10 Green tea St. Johns wort    Coumadin (Warfarin) FOOD Interactions  Eat a consistent number of servings per week of foods HIGH in Vitamin K (1 serving =  cup)  Collards (cooked, or boiled & drained) Kale (cooked, or boiled & drained) Mustard greens (cooked, or boiled & drained) Parsley *serving size only =  cup Spinach (cooked, or boiled & drained) Swiss chard (cooked, or boiled & drained) Turnip greens (cooked, or boiled & drained)  Eat a consistent number of servings per week of foods MEDIUM-HIGH in Vitamin K (1 serving = 1 cup)  Asparagus (cooked, or boiled & drained) Broccoli (cooked, boiled & drained, or raw & chopped) Brussel sprouts (cooked, or boiled & drained) *serving size only =  cup Lettuce, raw (green leaf, endive, romaine) Spinach, raw Turnip greens, raw & chopped   These websites have more information on Coumadin (warfarin):  http://www.king-russell.com/; https://www.hines.net/;   Pulmonary Embolism A pulmonary (lung) embolism (PE) is a blood clot that has traveled to the lung and results in a blockage of blood flow in the affected lung. Most clots come from deep veins in the legs or pelvis. PE is a dangerous and potentially life-threatening condition that can be treated if identified. CAUSES Blood clots form in a vein for different reasons. Usually several things cause blood clots. They include:  The flow of blood slows down.  The inside of the vein is damaged in some way.  The person has a condition that makes the blood clot more easily. RISK FACTORS Some people are more likely than others to develop PE. Risk factors include:   Smoking.  Being overweight (obese).  Sitting or lying still for a long time. This includes long-distance travel, paralysis, or recovery from an illness or surgery. Other factors that increase risk are:   Older age,  especially over 83 years of age.  Having a family history of blood clots or if you have already had a blood clot.  Having major or lengthy surgery. This is especially true for surgery on the hip, knee, or belly (abdomen). Hip surgery is particularly high risk.  Having a long, thin tube (catheter) placed inside a vein during a medical procedure.  Breaking a hip or leg.  Having cancer or cancer treatment.  Medicines containing the male hormone estrogen. This includes birth control pills and hormone replacement therapy.  Other circulation or heart problems.  Pregnancy and childbirth.  Hormone changes make the blood clot more easily during pregnancy.  The fetus puts pressure on the veins of the pelvis.  There is a risk of injury to veins during delivery or a caesarean delivery. The risk is highest just after childbirth.  PREVENTION   Exercise the legs regularly. Take a brisk 30 minute walk every day.  Maintain a weight that is appropriate for your height.  Avoid sitting or lying in bed for long periods of time without moving your legs.  Women, particularly those over the age of 35 years, should consider the risks and benefits of taking estrogen medicines, including birth control pills.  Do not smoke, especially if you take estrogen medicines.  Long-distance travel can increase your risk. You should exercise your legs by walking or pumping the muscles every hour.  Many of the risk factors above relate to situations that exist with hospitalization, either for illness, injury, or elective surgery. Prevention may include medical and nonmedical measures.   Your health care provider will assess you for the need for venous thromboembolism prevention when you are admitted to the hospital. If you are having surgery, your surgeon will assess you the day of or day after surgery.  SYMPTOMS  The symptoms of a PE usually start suddenly and include:  Shortness of  breath.  Coughing.  Coughing up blood or blood-tinged mucus.  Chest pain. Pain is often worse with deep breaths.  Rapid heartbeat. DIAGNOSIS  If a PE is suspected, your health care provider will take a medical history and perform a physical exam. Other tests that may be required include:  Blood tests, such as studies of the clotting properties of your blood.  Imaging tests, such as ultrasound, CT, MRI, and other tests to see if you have clots in your legs or lungs.  An electrocardiogram. This can look for heart strain from blood clots in the lungs. TREATMENT   The most common treatment for a PE is blood thinning (anticoagulant) medicine, which reduces the blood's tendency to clot. Anticoagulants can stop new blood clots from forming and old clots from growing. They cannot dissolve existing clots. Your body does this by itself over time. Anticoagulants can be given by mouth, through an intravenous (IV) tube, or by injection. Your health care provider will determine the best program for you.  Less commonly, clot-dissolving medicines (thrombolytics) are used to dissolve a PE. They carry a high risk of bleeding, so they are used mainly in severe cases.  Very rarely, a blood clot in the leg needs to be removed surgically.  If you are unable to take anticoagulants, your health care provider may arrange for you to have a filter placed in a main vein in your abdomen. This filter prevents clots from traveling to your lungs. HOME CARE INSTRUCTIONS   Take all medicines as directed by your health care provider.  Learn as much as you can about DVT.  Wear a medical alert bracelet or carry a medical alert card.  Ask your health care provider how soon you can go back to normal activities. It is important to stay active to prevent blood clots. If you are on anticoagulant medicine, avoid contact sports.  It is very important to exercise. This is especially important while traveling, sitting, or  standing for long periods of time. Exercise your legs by walking or by tightening and relaxing your leg muscles regularly. Take frequent walks.  You may need to wear compression stockings. These are tight elastic stockings that apply pressure to the lower legs. This pressure can help keep the blood in the legs from clotting. Taking Warfarin Warfarin is a daily medicine that is taken by mouth. Your health care provider will advise you on the length of treatment (usually 3-6 months, sometimes lifelong). If you take warfarin:  Understand how to take warfarin and foods that can affect how warfarin works in Public relations account executiveyour body.  Too much and too little warfarin are both dangerous. Too much warfarin increases the risk of bleeding. Too little warfarin continues to allow the risk for blood clots. Warfarin and Regular Blood Testing While taking warfarin, you will need to have regular blood tests to measure your blood clotting time. These blood  tests usually include both the prothrombin time (PT) and international normalized ratio (INR) tests. The PT and INR results allow your health care provider to adjust your dose of warfarin. It is very important that you have your PT and INR tested as often as directed by your health care provider.  Warfarin and Your Diet Avoid major changes in your diet, or notify your health care provider before changing your diet. Arrange a visit with a registered dietitian to answer your questions. Many foods, especially foods high in vitamin K, can interfere with warfarin and affect the PT and INR results. You should eat a consistent amount of foods high in vitamin K. Foods high in vitamin K include:   Spinach, kale, broccoli, cabbage, collard and turnip greens, Brussels sprouts, peas, cauliflower, seaweed, and parsley.  Beef and pork liver.  Green tea.  Soybean oil. Warfarin with Other Medicines Many medicines can interfere with warfarin and affect the PT and INR results. You  must:  Tell your health care provider about any and all medicines, vitamins, and supplements you take, including aspirin and other over-the-counter anti-inflammatory medicines. Be especially cautious with aspirin and anti-inflammatory medicines. Ask your health care provider before taking these.  Do not take or discontinue any prescribed or over-the-counter medicine except on the advice of your health care provider or pharmacist. Warfarin Side Effects Warfarin can have side effects, such as easy bruising and difficulty stopping bleeding. Ask your health care provider or pharmacist about other side effects of warfarin. You will need to:  Hold pressure over cuts for longer than usual.  Notify your dentist and other health care providers that you are taking warfarin before you undergo any procedures where bleeding may occur. Warfarin with Alcohol and Tobacco   Drinking alcohol frequently can increase the effect of warfarin, leading to excess bleeding. It is best to avoid alcoholic drinks or consume only very small amounts while taking warfarin. Notify your health care provider if you change your alcohol intake.  Do not use any tobacco products including cigarettes, chewing tobacco, or electronic cigarettes. If you smoke, quit. Ask your health care provider for help with quitting smoking. Alternative Medicines to Warfarin: Factor Xa Inhibitor Medicines  These blood thinning medicines are taken by mouth, usually for several weeks or longer. It is important to take the medicine every single day, at the same time each day.  There are no regular blood tests required when using these medicines.  There are fewer food and drug interactions than with warfarin.  The side effects of this class of medicine is similar to that of warfarin, including excessive bruising or bleeding. Ask your health care provider or pharmacist about other potential side effects. SEEK MEDICAL CARE IF:   You notice a rapid  heartbeat.  You feel weaker or more tired than usual.  You feel faint.  You notice increased bruising.  Your symptoms are not getting better in the time expected.  You are having side effects of medicine. SEEK IMMEDIATE MEDICAL CARE IF:   You have chest pain.  You have trouble breathing.  You have new or increased swelling or pain in one leg.  You cough up blood.  You notice blood in vomit, in a bowel movement, or in urine.  You have a fever. Symptoms of PE may represent a serious problem that is an emergency. Do not wait to see if the symptoms will go away. Get medical help right away. Call your local emergency services (911 in the [place]  States). Do not drive yourself to the hospital. Document Released: 04/25/2000 Document Revised: 09/12/2013 Document Reviewed: 05/09/2013 [facility] Patient Information 2015 [place], [place]. This information is not intended to replace advice given to you by your health care provider. Make sure you discuss any questions you have with your health care provider.

## 2014-06-29 NOTE — Progress Notes (Addendum)
Physical Therapy Evaluation Patient Details Name: Nathan [name] MRN: [id number] DOB: 02-07-1941 Today's Date: 06/29/2014   History of Present Illness  74 y.o. male with extensive CAD, COPD with ongoing tobacco abuse, chronic CHF, HTN, peripheral vascular disease, GERD, CKD stage III, recently hospitalized from 06/14/2014 to 06/18/2014 for an acute COPD exacerbation and now admitted after fall at home and sustained nondisplaced right-sided pelvic ring disruption now nonweightbearing right lower extremity and touch touchdown weightbearing left lower extremity. VQ scan with intermediate prob PE   Clinical Impression  Pt admitted with above diagnosis. Pt currently with functional limitations due to the deficits listed below (see PT Problem List).  Pt will benefit from skilled PT to increase their independence and safety with mobility to allow discharge to the venue listed below.  Pt required increased encouragement due to fear of pain with movement.  Pt only able to tolerate some movement of LEs in supine and attempted rolling to L side however unable to complete due to pain.  RN in change R ear dressing due to increased bleeding.  Pt educated on LEs WBing status also written on board in room, however pt aware his mobility at this time is limited to transfers to w/c.  Pt will likely require increased assist upon d/c so started discussion of SNF upon d/c however pt seemed to be in mindset of being able to manage at home even though he couldn't tolerate bed mobility today.     Follow Up Recommendations SNF;Supervision/Assistance - 24 hour    Equipment Recommendations  None recommended by PT    Recommendations for Other Services       Precautions / Restrictions Precautions Precautions: Fall Precaution Comments: monitor sats Restrictions Weight Bearing Restrictions: Yes RLE Weight Bearing: Non weight bearing LLE Weight Bearing: Touchdown weight bearing      Mobility  Bed Mobility Overal  bed mobility: Needs Assistance;+2 for physical assistance Bed Mobility: Rolling Rolling: Total assist         General bed mobility comments: attempted rolling to L side, pt able to grab rail however unable to provide assist, only allowed therapist to support R LE and use bed pad to move hips to sidelying however not able to tolerate completing sidelying position  Transfers                    Ambulation/Gait                Stairs            Wheelchair Mobility    Modified Rankin (Stroke Patients Only)       Balance                                             Pertinent Vitals/Pain Pain Assessment: 0-10 Pain Score: 10-Worst pain ever Pain Location: pelvis mostly R side with movement Pain Descriptors / Indicators: Sore Pain Intervention(s): Repositioned;Monitored during session;Premedicated before session;Limited activity within patient's tolerance    Home Living Family/patient expects to be discharged to:: Skilled nursing facility Living Arrangements: Spouse/significant other   Type of Home: Mobile home         Home Equipment: None      Prior Function Level of Independence: Independent               Hand Dominance        Extremity/Trunk Assessment  Lower Extremity Assessment: RLE deficits/detail;LLE deficits/detail RLE Deficits / Details: very limited AAROM due to pain LLE Deficits / Details: grossly 2+/5 throughout     Communication      Cognition Arousal/Alertness: Awake/alert Behavior During Therapy: WFL for tasks assessed/performed Overall Cognitive Status: Impaired/Different from baseline Area of Impairment: Safety/judgement         Safety/Judgement: Decreased awareness of deficits     General Comments: pt unable to tolerate bed mobility and believes he will mangage at home    General Comments      Exercises        Assessment/Plan    PT Assessment Patient needs  continued PT services  PT Diagnosis Acute pain;Generalized weakness   PT Problem List Decreased strength;Decreased mobility;Pain;Decreased knowledge of precautions;Decreased knowledge of use of DME  PT Treatment Interventions DME instruction;Balance training;Patient/family education;Functional mobility training;Therapeutic activities;Therapeutic exercise;Wheelchair mobility training   PT Goals (Current goals can be found in the Care Plan section) Acute Rehab PT Goals PT Goal Formulation: With patient Time For Goal Achievement: 07/06/14 Potential to Achieve Goals: Fair    Frequency Min 3X/week   Barriers to discharge        Co-evaluation               End of Session   Activity Tolerance: Patient limited by pain Patient left: in bed;with call bell/phone within reach;with bed alarm set Nurse Communication: Mobility status         Time: [id number] PT Time Calculation (min) (ACUTE ONLY): 21 min   Charges:   PT Evaluation $Initial PT Evaluation Tier I: 1 Procedure     PT G Codes:        Winda Summerall,KATHrine E 06/29/2014, 12:24 PM Zenovia JarredKati Cullan Launer, PT, DPT 06/29/2014 Pager: [phone]

## 2014-06-29 NOTE — Progress Notes (Addendum)
Clinical Social Work Department CLINICAL SOCIAL WORK PLACEMENT NOTE 06/29/2014  Patient:  Nathan [name],Nathan [name]  Account Number:  [id number] Admit date:  06/23/2014  Clinical Social Worker:  Orpah GreekKELLY FOLEY, LCSWA  Date/time:  06/29/2014 01:40 PM  Clinical Social Work is seeking post-discharge placement for this patient at the following level of care:   SKILLED NURSING   (*CSW will update this form in Epic as items are completed)   06/29/2014  [facility]/family provided with [facility] of Clinical Social Work's list of facilities offering this level of care within the geographic area requested by the patient (or if unable, by the patient's family).  06/29/2014  Patient/family informed of their freedom to choose among providers that offer the needed level of care, that participate in Medicare, Medicaid or managed care program needed by the patient, have an available bed and are willing to accept the patient.  06/29/2014  Patient/family informed of [facility]' ownership interest in [facility], as well as of the fact that they are under no obligation to receive care at this facility.  PASARR submitted to EDS on 06/29/2014 PASARR number received on 06/29/2014  FL2 transmitted to all facilities in geographic area requested by pt/family on  06/29/2014 FL2 transmitted to all facilities within larger geographic area on   Patient informed that his/her managed care company has contracts with or will negotiate with  certain facilities, including the following:     Patient/family informed of bed offers received:  06/29/2014 Patient chooses bed at [facility] Physician recommends and patient chooses bed at    Patient to be transferred to  [facility] on 07/10/2014  Patient to be transferred to facility by ambulance Sharin Mons([facility]) Patient and family notified of transfer on 07/10/2014 Name of family member notified:  Pt and pt wife, [name]  The following physician  request were entered in Epic:   Additional Comments:    Lincoln MaxinKelly Harrison, LCSW [facility] Clinical Social Worker cell #: [phone]   Loletta SpecterSuzanna Kidd, MSW, LCSW Clinical Social Work [phone]

## 2014-06-29 NOTE — Progress Notes (Signed)
Clinical Social Work Department BRIEF PSYCHOSOCIAL ASSESSMENT 06/29/2014  Patient:  Nathan [name],Nathan [name]     Account Number:  [id number]     Admit date:  06/23/2014  Clinical Social Worker:  Nathan [name],Nathan [name], CLINICAL SOCIAL WORKER  Date/Time:  06/29/2014 01:21 PM  Referred by:  Physician  Date Referred:  06/29/2014 Referred for  SNF Placement   Other Referral:   Interview type:  Patient Other interview type:   and son, Nathan [name] at bedside    PSYCHOSOCIAL DATA Living Status:  ALONE Admitted from facility:   Level of care:   Primary support name:  Nathan [name] Primary support relationship to patient:  CHILD, ADULT Degree of support available:   Adequate    CURRENT CONCERNS Current Concerns  Post-Acute Placement   Other Concerns:    SOCIAL WORK ASSESSMENT / PLAN CSW received consult from PT, who recommends patient goes to a SNF for short term rehab. [place] [place] [facility] and have [place] bed offers.   Assessment/plan status:  [facility] Other assessment/ plan:   Information/referral to community resources:   CSW to give patient and son bed offers for further DC plans.    PATIENT'S/FAMILY'S RESPONSE TO PLAN OF CARE: CSW spoke with patient and son , Nathan [name] who is at bedside. CSW introduced self and explained role. CSW explained PT recommendation for patient to go to a short term SNF for rehab. Patient reports of never being at a SNF and not knowing what it is. CSW explained in depth what a SNF is and what it offers. SonKarin Lieu- [name] and also does not know much about SNF and wishes to research more about SNF before making a decision. CSW gave patient and son list of bed offers and SNF that is in network with patient's insurance. Patient and son is looking over information and will contact CSW when they have made a decision. CSW will continue to follow.       Nathan AbbotKadijah Vernon [name] BSW Intern

## 2014-06-30 DIAGNOSIS — I481 Persistent atrial fibrillation: Secondary | ICD-10-CM

## 2014-06-30 LAB — CBC
HEMATOCRIT: 36.2 % — AB (ref 39.0–52.0)
HEMOGLOBIN: 11.3 g/dL — AB (ref 13.0–17.0)
MCH: 25.5 pg — ABNORMAL LOW (ref 26.0–34.0)
MCHC: 31.2 g/dL (ref 30.0–36.0)
MCV: 81.5 fL (ref 78.0–100.0)
Platelets: 130 10*3/uL — ABNORMAL LOW (ref 150–400)
RBC: 4.44 MIL/uL (ref 4.22–5.81)
RDW: 16.6 % — AB (ref 11.5–15.5)
WBC: 10.9 10*3/uL — AB (ref 4.0–10.5)

## 2014-06-30 LAB — BASIC METABOLIC PANEL
ANION GAP: 13 (ref 5–15)
BUN: 84 mg/dL — AB (ref 6–23)
CHLORIDE: 96 mmol/L (ref 96–112)
CO2: 28 mmol/L (ref 19–32)
CREATININE: 2.4 mg/dL — AB (ref 0.50–1.35)
Calcium: 8.7 mg/dL (ref 8.4–10.5)
GFR, EST AFRICAN AMERICAN: 29 mL/min — AB (ref >90)
GFR, EST NON AFRICAN AMERICAN: 25 mL/min — AB (ref >90)
Glucose, Bld: 117 mg/dL — ABNORMAL HIGH (ref 70–99)
Potassium: 4.9 mmol/L (ref 3.5–5.1)
SODIUM: 137 mmol/L (ref 135–145)

## 2014-06-30 LAB — PROTIME-INR
INR: 2.95 — ABNORMAL HIGH (ref 0.00–1.49)
Prothrombin Time: 31 seconds — ABNORMAL HIGH (ref 11.6–15.2)

## 2014-06-30 LAB — HEPARIN LEVEL (UNFRACTIONATED): Heparin Unfractionated: 0.54 IU/mL (ref 0.30–0.70)

## 2014-06-30 MED ORDER — BISACODYL 10 MG RE SUPP
10.0000 mg | Freq: Once | RECTAL | Status: AC
  Administered 2014-06-30: 10 mg via RECTAL
  Filled 2014-06-30: qty 1

## 2014-06-30 MED ORDER — ROSUVASTATIN CALCIUM 5 MG PO TABS
5.0000 mg | ORAL_TABLET | Freq: Every day | ORAL | Status: DC
  Administered 2014-06-30: 5 mg via ORAL
  Filled 2014-06-30: qty 0.5
  Filled 2014-06-30 (×2): qty 1

## 2014-06-30 MED ORDER — TIOTROPIUM BROMIDE MONOHYDRATE 18 MCG IN CAPS
18.0000 ug | ORAL_CAPSULE | Freq: Every day | RESPIRATORY_TRACT | Status: DC
  Administered 2014-06-30 – 2014-07-01 (×2): 18 ug via RESPIRATORY_TRACT

## 2014-06-30 MED ORDER — FUROSEMIDE 40 MG PO TABS
40.0000 mg | ORAL_TABLET | Freq: Two times a day (BID) | ORAL | Status: DC
  Administered 2014-06-30 – 2014-07-01 (×2): 40 mg via ORAL
  Filled 2014-06-30 (×2): qty 1

## 2014-06-30 MED ORDER — COLCHICINE 0.6 MG PO TABS: 0.3000 mg | ORAL_TABLET | Freq: Every day | ORAL | Status: DC

## 2014-06-30 NOTE — Progress Notes (Signed)
PROGRESS NOTE  Subjective:   Allen KellMaurice W Hollibaugh is a 74 y.o. male with history of CAD s/p CABG, AFL s/p ablation (details unclear),[facility] chronic combined heart failure, hypertension, severe COPD with ongoing tobacco use previously on home O2, CKD (baseline Cr 1.5-1.7), stroke, GERD, PAD (LE stenting 2005, R CEA 2013), and medication noncompliance.  He fell last week and suffered a pelvic fracture.  He has had progressive dyspnea and has had a markely elevated D-dimer.      VQ scan done  was intermediate probability.   Showed 2 peripheral wedge shaped defects in the RLL.  He has developed atrial fib.  Rate has been well controlled.     Objective:    Vital Signs:   Temp:  [97.7 F (36.5 C)-97.8 F (36.6 C)] 97.8 F (36.6 C) (02/19 0516) Pulse Rate:  [80-85] 85 (02/19 0516) Resp:  [18-20] 19 (02/19 0516) BP: (107-124)/(60-74) 124/66 mmHg (02/19 0516) SpO2:  [91 %-97 %] 93 % (02/19 0516) Weight:  [168 lb 14 oz (76.6 kg)] 168 lb 14 oz (76.6 kg) (02/19 0516)  Last BM Date: 06/23/14   24-hour weight change: Weight change: -6 lb 13.4 oz (-3.1 kg)  Weight trends: Filed Weights   06/28/14 0500 06/29/14 0436 06/30/14 0516  Weight: 179 lb 7.3 oz (81.4 kg) 175 lb 11.3 oz (79.7 kg) 168 lb 14 oz (76.6 kg)    Intake/Output:  02/18 0701 - 02/19 0700 In: 736 [P.O.:540; I.V.:196] Out: 1050 [Urine:1050]     Physical Exam: BP 124/66 mmHg  Pulse 85  Temp(Src) 97.8 F (36.6 C) (Oral)  Resp 19  Ht 6\' 1"  (1.854 m)  Wt 168 lb 14 oz (76.6 kg)  BMI 22.28 kg/m2  SpO2 93%  Wt Readings from Last 3 Encounters:  06/30/14 168 lb 14 oz (76.6 kg)  06/23/14 186 lb (84.369 kg)  06/20/14 200 lb (90.719 kg)    General: Vital signs reviewed and noted.   Head: Normocephalic, atraumatic.  Eyes: conjunctivae/corneas clear.  EOM's intact.   Throat: normal  Neck:  normal   Lungs:     Rales, rhonchi  Heart:  irreg. Irreg.   Abdomen:  Soft, non-tender,  non-distended    Extremities: 2-3+ pitting edema .     Neurologic: A&O X3, CN II - XII are grossly intact.   Psych: Normal     Labs: BMET:  Recent Labs  06/29/14 0445 06/30/14 0522  NA 134* 137  K 4.6 4.9  CL 94* 96  CO2 28 28  GLUCOSE 122* 117*  BUN 63* 84*  CREATININE 2.02* 2.40*  CALCIUM 8.5 8.7    Liver function tests: No results for input(s): AST, ALT, ALKPHOS, BILITOT, PROT, ALBUMIN in the last 72 hours. No results for input(s): LIPASE, AMYLASE in the last 72 hours.  CBC:  Recent Labs  06/29/14 0445 06/30/14 0522  WBC 9.6 10.9*  HGB 11.0* 11.3*  HCT 36.1* 36.2*  MCV 82.2 81.5  PLT 107* 130*    Cardiac Enzymes: No results for input(s): CKTOTAL, CKMB, TROPONINI in the last 72 hours.  Coagulation Studies:  Recent Labs  06/28/14 0200 06/29/14 0445 06/30/14 0522  LABPROT 13.8 19.1* 31.0*  INR 1.05 1.59* 2.95*    Other: Invalid input(s): POCBNP No results for input(s): DDIMER in the last 72 hours. No results for input(s): HGBA1C in the last 72 hours. No results for input(s): CHOL, HDL, LDLCALC, TRIG, CHOLHDL in the last 72 hours.  No results for input(s): TSH, T4TOTAL, T3FREE, THYROIDAB in the last 72 hours.  Invalid input(s): FREET3 No results for input(s): VITAMINB12, FOLATE, FERRITIN, TIBC, IRON, RETICCTPCT in the last 72 hours.   Other results:  EKG  ( personally reviewed )  -  Atrial fib with V rate of 80s -90s .     Medications:    Infusions: . heparin 1,800 Units/hr (06/30/14 0517)    Scheduled Medications: . aspirin  81 mg Oral Daily  . buPROPion  300 mg Oral Daily  . carvedilol  12.5 mg Oral BID WC  . colchicine  0.6 mg Oral Daily  . diltiazem  30 mg Oral QID  . docusate sodium  100 mg Oral BID  . furosemide  60 mg Oral BID  . isosorbide mononitrate  15 mg Oral Daily  . levalbuterol  1.25 mg Nebulization 4 times per day  . mometasone-formoterol  2 puff Inhalation BID  . nicotine  21 mg Transdermal Daily  . pantoprazole   40 mg Oral Daily  . polyethylene glycol  17 g Oral Daily  . potassium chloride  20 mEq Oral BID  . rosuvastatin  5 mg Oral q1800  . senna  1 tablet Oral BID  . sodium chloride  3 mL Intravenous Q12H  . spironolactone  25 mg Oral Daily  . Warfarin - Pharmacist Dosing Inpatient   Does not apply q1800    Assessment/ Plan:   Principal Problem:   Acute respiratory distress Active Problems:   COPD (chronic obstructive pulmonary disease), gold stage C.   Hyperlipidemia   S/P CABG (coronary artery bypass graft)   Chronic kidney disease (CKD), stage III (moderate)   Tobacco use disorder   PVD (peripheral vascular disease)   Essential hypertension   PAF (paroxysmal atrial fibrillation)   CAD (coronary artery disease)   Chronic combined systolic and diastolic CHF (congestive heart failure)   Anemia   Hypertensive heart disease   Right hip pain   Fall   Ear lobe laceration   Hip pain, acute   Acute hip pain   Acute on chronic systolic CHF (congestive heart failure)   Dislocation of hip, obturator, right, closed   Sacral fracture, closed   Pulmonary embolus: Probable  1.  Acute respiratory failure:  Related to his severe underlying COPD and pulmonary embolus.  Continue supportive care. He is very short of breath of breath.  Increased work of breathing. This seems to be more related to his underlying COPD and the acute PE than cardiac issues. His HR is well controlled.   He was still smoking when he came to the hosptial.    2.   pulmonary embolus:     He is on coumadin . INR is 2.95.  He may have his INR levels drawn by his primary medical doctor or may have them drawn in our coumadin clinic.   3. Atrial fibrillation:  Has new onset atrial fib.  This is likely related to his severe COPD and the pulmonary embolus.   His HR is well controlled.  Echo reveals mild LV dysfunction with EF of 40%.  He is not wheezing this am and seems to be tolerating the Coreg.  We can consider changing to  Bisoprolol if he has wheezing.   I would avoid high doses of diltiazem because of his LV dysfunction Rate is well controlled.  No further recs.    4. CAD :  His last cath was in Nov. 2013 that shows: patent  LIMA to LAD, SVG -RCA.  The SVG - OM and the SVG - Diag were occluded.   His respiratory distress over the past several days is not consistent with an acute coronary syndrome  5. CHK - stage III - plans per Int. Med.    6. Acute on chronic systolic  CHF :  Has improved with diuresis.  He has severe COPD and has significant leg edema.    He is on PO lasix and is diuresing well.   I/O -9 liters so far during this hospitalization.  Continue current med  He needs leg elevation.    Disposition:  Length of Stay: 6  Vesta Mixer, Montez Hageman., MD, [facility] 06/30/2014, 7:22 AM Office [phone] Pager [phone]

## 2014-06-30 NOTE — Progress Notes (Signed)
RT found Pt with his O2 out of his nose, Pt SATS were 79% on Room air.  RT gave Pt neb treatment and placed on 6 LPM O2 after treatment.  Pt sats still in mid 80's at this point, Pt placed on 55% VM and sats came up to 88%.  RN notified and asked to recheck sats in 10 mins.  RT to monitor and assess as needed.

## 2014-06-30 NOTE — Progress Notes (Addendum)
ANTICOAGULATION CONSULT NOTE - Follow Up Consult  Pharmacy Consult for Heparin/warfarin Indication:  pulmonary embolus  No Known Allergies  Patient Measurements: Height: 6\' 1"  (185.4 cm) Weight: 168 lb 14 oz (76.6 kg) IBW/kg (Calculated) : 79.9 Heparin Dosing Weight: actual weight  Vital Signs: Temp: 97.8 F (36.6 C) (02/19 0516) Temp Source: Oral (02/19 0516) BP: 124/66 mmHg (02/19 0516) Pulse Rate: 85 (02/19 0516)  Labs:  Recent Labs  06/28/14 0200 06/28/14 1012 06/29/14 0445 06/30/14 0522  HGB 11.2*  --  11.0* 11.3*  HCT 36.6*  --  36.1* 36.2*  PLT 108*  --  107* 130*  LABPROT 13.8  --  19.1* 31.0*  INR 1.05  --  1.59* 2.95*  HEPARINUNFRC 0.33 0.41 0.28* 0.54  CREATININE 1.97*  --  2.02* 2.40*    Estimated Creatinine Clearance: 28.8 mL/min (by C-G formula based on Cr of 2.4).   Medications:  Infusions:  . heparin 1,800 Units/hr (06/30/14 0517)    Assessment: 74 yo male admitted 06/23/14 s/p fall. He was recently admitted 2/3-06/18/14 for an acute COPD exacerbation. On 06/25/14, he developed acute respiratory distress, pO2 below reportable range and elevated D-dimer. Prophylactic Lovenox (last dose 2/13 ~2200) was discontinued. PLTs noted to be chronically low. VQ scan has returned with intermediate probability for PE and LE dopplers negative for DVT. Pharmacy consulted to dose IV heparin & warfarin for PE.   2/13 baseline INR = 1.32   Today, 06/30/2014:  Heparin level 0.54, therapeutic level on heparin at 18 ml/hr.  Heparin infusing at ordered rate and RN states no issues with infusion  INR 2.95, therapeutic with large rate of rise overnight following doses of  5mg , 6mg , 4mg   CBC: Hgb 11.3 (stable), PLT 130 (chronically low)  Blood oozing from wound on ear suffered during fall.  Gauze bandage noted to be bloody 2/18 but bleeding has decreased overnight  Diet: Cardiac - intake poor  Drug-Drug interactions with warfarin: NONE (on ASA 81mg  with h/o  CAD)  SCr elevated with h/o CKD - DOACs not preferred  Goal of Therapy:  Heparin level 0.3-0.7 units/ml Monitor platelets by anticoagulation protocol: Yes   Plan:  Day #4 warfarin/heparin VTE overlap  - Needs one more day of heparin to complete overlap therapy (unless bleeding contradicts), may D/C heparin 2/20 if INR remains therapeutic.  Continue  heparin IV infusion at 1800 units/hr (18 ml/hr)  NO warfarin tonight based on INR rate of rise (dose lowered last night but INR >1 increase).  Suspect poor oral intake is contributing.  Consider dietician evaluation   Suspect will need warfarin 2.5mg  tabs at discharge   Daily INR, heparin level and CBC  Education provided 2/18 but patient did not have strong interest in learning or reading material provided.  Patient was able to answer some of the questions during teach back.  Son was also present during education.  PT has been unable to evaluate patient, if patient were to be discharge home, he would need assistance (ie. Home health) drawing INRs, etc. following fall and pelvic fracture   Juliette Alcideustin Zeigler, PharmD, BCPS.   Pager: [phone] 06/30/2014 7:02 AM

## 2014-06-30 NOTE — Progress Notes (Signed)
CSW met with patient & wife, Nathan [name] at bedside - provided SNF bed offers. Wife accepted bed at [facility] as it is the closest SNF to their home. CSW left message for Karla/Crystal at [facility] sent PT/OT notes for [facility] authorization.    Clinical Social Work Department CLINICAL SOCIAL WORK PLACEMENT NOTE 06/30/2014  Patient:  Nathan [name], Nathan [name]  Account Number:  [id number] Admit date:  06/23/2014  Clinical Social Worker:  Renold Genta  Date/time:  06/29/2014 01:40 PM  Clinical Social Work is seeking post-discharge placement for this patient at the following level of care:   SKILLED NURSING   (*CSW will update this form in Epic as items are completed)   06/29/2014  Patient/family provided with [place] Department of Clinical Social Work's list of facilities offering this level of care within the geographic area requested by the patient (or if unable, by the patient's family).  06/29/2014  Patient/family informed of their freedom to choose among providers that offer the needed level of care, that participate in Medicare, Medicaid or managed care program needed by the patient, have an available bed and are willing to accept the patient.  06/29/2014  Patient/family informed of [facility]' ownership interest in [facility], as well as of the fact that they are under no obligation to receive care at this facility.  PASARR submitted to EDS on 06/29/2014 PASARR number received on 06/29/2014  FL2 transmitted to all facilities in geographic area requested by pt/family on  06/29/2014 FL2 transmitted to all facilities within larger geographic area on   Patient informed that his/her managed care company has contracts with or will negotiate with  certain facilities, including the following:     Patient/family informed of bed offers received:  06/29/2014 Patient chooses bed at [facility] Physician recommends and patient chooses bed at    Patient to be  transferred to [facility] on   Patient to be transferred to facility by  Patient and family notified of transfer on  Name of family member notified:    The following physician request were entered in Epic:   Additional Comments:   Raynaldo Opitz, [place] Social Worker cell #: [phone]

## 2014-06-30 NOTE — Progress Notes (Signed)
Occupational Therapy Treatment Patient Details Name: Nathan [name] MRN: [id number] DOB: 01/18/1941 Today's Date: 06/30/2014    History of present illness 74 y.o. male with extensive CAD, COPD with ongoing tobacco abuse, chronic CHF, HTN, peripheral vascular disease, GERD, CKD stage III, recently hospitalized from 06/14/2014 to 06/18/2014 for an acute COPD exacerbation and now admitted after fall at home and sustained nondisplaced right-sided pelvic ring disruption now nonweightbearing right lower extremity and touch touchdown weightbearing left lower extremity. VQ scan with intermediate prob PE    OT comments  Pt was willing to try theraband exercises.  He needs rest breaks every couple of repetitions.  He was limited by pain this pm  Follow Up Recommendations  SNF    Equipment Recommendations       Recommendations for Other Services      Precautions / Restrictions Precautions Precautions: Fall Precaution Comments: monitor sats Restrictions Weight Bearing Restrictions: Yes RLE Weight Bearing: Non weight bearing LLE Weight Bearing: Non weight bearing       Mobility Bed Mobility                  Transfers                      Balance                                   ADL                          Encouraged pt to eat--had eaten very little of his meal:  Willing to drink soda only while OT was in room                      Vision                     Perception     Praxis      Cognition   Behavior During [facility]: [facility] for tasks assessed/performed Overall Cognitive Status: Impaired/Different from baseline                  General Comments: decreased attention to task:  pain may have interfered    Extremity/Trunk Assessment               Exercises Other Exercises Other Exercises: Worked on bil UE strengthening exercise with level 2 theraband:  may downgrade to level one on next visit.   Performed 5 reps of shoulder FF with each UE.  Pt needed rest breaks before he could complete 5. Also, multimodal cues given for UE position.  Pt was bending elbows, targeting biceps.  After one set of each, pt c/o abdominal pain.  Did not want to perform any more exercise even after rest break.   Shoulder Instructions       General Comments      Pertinent Vitals/ Pain       Pain Assessment: Faces Faces Pain Scale: Hurts even more Pain Location: stomach Pain Descriptors / Indicators: Aching Pain Intervention(s): Limited activity within patient's tolerance;Monitored during session;Premedicated before session  Home Living                                          Prior Functioning/Environment  Frequency Min 2X/week     Progress Toward Goals  OT Goals(current goals can now be found in the care plan section)  Progress towards OT goals: Progressing toward goals (slowly)     Plan      Co-evaluation                 End of Session     Activity Tolerance Patient limited by pain   Patient Left in bed;with call bell/phone within reach   Nurse Communication          Time: 1521-1530 OT Time Calculation (min): 9 min  Charges: OT General Charges $OT Visit: 1 Procedure OT Treatments $Therapeutic Exercise: 8-22 mins  Zahid Carneiro 06/30/2014, 3:39 PM  Marica Otter, OTR/L [phone] 06/30/2014

## 2014-06-30 NOTE — Evaluation (Signed)
Occupational Therapy Evaluation Patient Details Name: Nathan KellMaurice W Vanevery MRN: [id number] DOB: 10/07/1940 Today's Date: 06/30/2014    History of Present Illness 74 y.o. male with extensive CAD, COPD with ongoing tobacco abuse, chronic CHF, HTN, peripheral vascular disease, GERD, CKD stage III, recently hospitalized from 06/14/2014 to 06/18/2014 for an acute COPD exacerbation and now admitted after fall at home and sustained nondisplaced right-sided pelvic ring disruption now nonweightbearing right lower extremity and touch touchdown weightbearing left lower extremity. VQ scan with intermediate prob PE    Clinical Impression   Pt was admitted for the above.  Will follow in acute for UE strengthening and bed mobility/sliding board transfers to commode.  Pt will need SNF for rehab after acute stay    Follow Up Recommendations  SNF    Equipment Recommendations   (possibly drop arm commode:  not ready currently)    Recommendations for Other Services       Precautions / Restrictions Precautions Precautions: Fall Precaution Comments: monitor sats Restrictions Weight Bearing Restrictions: Yes RLE Weight Bearing: Non weight bearing LLE Weight Bearing: Touchdown weight bearing      Mobility Bed Mobility               General bed mobility comments: not attempted:  was total A x 2 with PT and very painful  Transfers                 General transfer comment: not attempted    Balance                                            ADL Overall ADL's : Needs assistance/impaired Eating/Feeding: Set up   Grooming: Wash/dry face;Wash/dry hands;Set up;Sitting   Upper Body Bathing: Set up;Bed level       Upper Body Dressing : Minimal assistance;Bed level                     General ADL Comments: Pt was not interested in trying to move body:  he was agreeable to letting me check arms/reach for ADLs.  Decreased initiation when breakfast came.  Pt is  agreeable to working with theraband for UB strength:  explained that he will likely use a transfer board to move from bed to w/c.  Pt needs total A x 2 for LB adls.     Vision     Perception     Praxis      Pertinent Vitals/Pain Pain Assessment: 0-10 Pain Score: 5  (without active movement of LEs) Pain Location: pelvis Pain Descriptors / Indicators: Sore Pain Intervention(s): Limited activity within patient's tolerance;Monitored during session;Premedicated before session     Hand Dominance Right   Extremity/Trunk Assessment Upper Extremity Assessment Upper Extremity Assessment: Overall WFL for tasks assessed (strength grossly 4/5 R and 4-/5 L)           Communication Communication Communication: No difficulties   Cognition Arousal/Alertness: Awake/alert Behavior During Therapy: WFL for tasks assessed/performed Overall Cognitive Status: Impaired/Different from baseline                 General Comments: Pt with decreased initiation this am.  Oriented to self/place   General Comments       Exercises       Shoulder Instructions      Home Living Family/patient expects to be discharged to:: Skilled nursing facility  Prior Functioning/Environment Level of Independence: Independent             OT Diagnosis: Acute pain;Generalized weakness   OT Problem List: Decreased strength;Decreased activity tolerance;Pain;Decreased knowledge of use of DME or AE   OT Treatment/Interventions: Self-care/ADL training;DME and/or AE instruction;Patient/family education;Therapeutic exercise    OT Goals(Current goals can be found in the care plan section) Acute Rehab OT Goals Patient Stated Goal: none stated OT Goal Formulation: With patient Time For Goal Achievement: 07/14/14 Potential to Achieve Goals: Fair ADL Goals Pt Will Transfer to Toilet: with max assist;with +2 assist;with transfer board;bedside commode  (drop arm) Additional ADL Goal #1: pt will get to eob from bed with [facility] raised fully with max A x 2 in preparation for toilet transfer Additional ADL Goal #2: pt will perform bil UE strengthening exercises with supervision/cues x 10 reps 4 shoulder/elbow motions  OT Frequency: Min 2X/week   Barriers to D/C:            Co-evaluation              End of Session    Activity Tolerance: Patient tolerated treatment well Patient left: in bed;with call bell/phone within reach   Time: 6295-2841 OT Time Calculation (min): 12 min Charges:  OT General Charges $OT Visit: 1 Procedure OT Evaluation $Initial OT Evaluation Tier I: 1 Procedure G-Codes:    Jalon Squier July 25, 2014, 9:08 AM Marica Otter, OTR/L [phone] 2014/07/25

## 2014-06-30 NOTE — Progress Notes (Signed)
Patient ID: Nathan [name], male   DOB: June 16, 1940, 74 y.o.   MRN: [id number]  [facility] PROGRESS NOTE  Nathan [name] VQQ:[id number] DOB: Aug 22, 1940 DOA: 06/23/2014 PCP: Nathan Mountain, MD   Brief narrative:    74 y.o. male with extensive CAD, COPD with ongoing tobacco abuse, chronic CHF, HTN, peripheral vascular disease, GERD, CKD stage III, recently hospitalized from 06/14/2014 to 06/18/2014 for an acute COPD exacerbation, now presented back to [facility] ED on 2/12 after an episode of fall that occurred several hours prior to this admission while he was trying to go to the bathroom. He stood up and his right leg gave away and he fell on his right side, subsequently unable to stand up and bear weight.   In ED, pt was hemodynamically stable, right hip diagnostic studies negative for fractures or dislocations. Blood work notable for Cr 1.61, WBC 11K. Pt admitted for further evaluation due to ongoing hip pain.   Major events since admission: 2/13 - MRI right hip with non displaced ring obturator fracture, conservative mngt per ortho  2/14 - dyspnea, lasix started as CXR with more vascular congestion, transferred to SDU  2/15 - VQ scan with intermediate prob PE, heparin drip started with Coumadin bridging  2/17 - developed sudden onset a-fib   Assessment/Plan:    Acute respiratory failure with hypoxia  - appears to be multifactorial in etiology, secondary to combines systolic and diastolic CHF, PE - on lasix 60 mg IV BID, weight is trending down: 187 lbs 2/14 --> 179 --> 175 --> 168 lbs this AM - maintaining good urine output, lower the dose of lasix to 40 mg PO BID  - continue Heparin/ Coumadin per pharmacy, INR 2.95 this AM   A-fib with RVR - developed AM of 2/17  - possibly triggered by duonebs - changed albuterol to levalbuterol, rate is now better controlled   Acute on chronic combined systolic and diastolic heart failure - repeat 2 D ECHO this admission with EF  40% - transitioned to oral Lasix 2/17 and weight continues trending down - still with significant LE edema  - Continue Coreg, Aldactone, Imdur  Intermediate probability of pulmonary emboli - continue Heparin with transition to Coumadin per pharmacy - will need 6 months therapy   Right obturator ring fracture/right sacral and left fractures/bilateral hip girdle muscular strain/Right hip pain - nondisplaced right-sided pelvic ring disruption  - nonweightbearing right lower extremity, okay to touch touchdown weightbearing  - should be a self-limited problem  - per ortho rec's, will need follow-up with Dr. August [name] in 3 weeks for repeat radiographs to make sure everything is healing  Mechanical fall - PT/OT  COPD Gold stage III - no wheezing on exam this AM - changed albuterol to levalbuterol due to a-fib  - provide oxygen via [place]   Coronary artery disease status post CABG/acute on chronic combined systolic and diastolic heart failure - continue Coreg, Aldactone, Imdur, Lipitor  Chronic kidney disease stage III - Cr trending: 2.11 --> 1.97 --> 2.02 --> 2.40, monitor closely and repeat BMP in AM - lower the dose of Lasix   Leukocytosis - determined to be secondary to reactive process  - WBC trending: 16 --> 14.7 --> 13.5 --> 9.6 --> 10.9 - pt denies chest pain or shortness of breath this AM, no fevers over the past 24 hours   Tobacco abuse - provide nicotine patch   Thrombocytopenia - no signs of active bleeding - Plt count improving  - CBC in  AM  Hypertension - reasonable inpatient control but slightly soft this AM  Ear lobe laceration - Status post repair  Prophylaxis - PPI for GI prophylaxis. Coumadin for a-fib   Code Status: Full.  Family Communication: plan of care discussed with the patient Disposition Plan: Possible d/c in AM, SNF  IV access:  Peripheral IV  Procedures and diagnostic studies:    Nm Pulmonary Perf/Vent 06/26/2014 Intermediate  probability for pulmonary embolism by PIOPED II. Bibasilar airspace disease and pleural effusions significantly limits the utility of V/Q scan.   Dg Chest Port 1 View 06/26/2014 Slight improvement in the left lung consolidation. Persistent edema and right pleural effusion.   Dg Chest Port 1 View 06/25/2014 Bilateral mid to lower lung heterogeneous opacities, left greater than right, with associated moderate right and probable small left pleural effusions. Findings may represent pulmonary edema, infection or underlying atelectasis.   Dg Chest Port 1 View 06/24/2014 Decreased small bilateral pleural effusions and right basilar atelectasis. Stable cardiomegaly.   Mr Hip Right Wo Contrast 06/24/2014 Nondisplaced RIGHT obturator ring fractures. Complementary nondisplaced RIGHT sacral ala fracture. Bilateral hip girdle muscular strain. Anasarca up. LEFT inguinal hernia containing a loop of sigmoid colon.   CXR 06/14/2014 Moderate right and small left pleural effusions, loculated left pleural effusion increased since 06/06/2014.   Dg Hip Unilat 06/23/2014 There is no acute bony abnormality of the right hip.   Ct Head Wo Contrast 06/23/2014 Atrophy and microvascular ischemic disease without acute intracranial process.   Medical Consultants:  Cardiology Orthopedic   Other Consultants:  PT  IAnti-Infectives:   None   Nathan PrestoMAGICK-Keondria Siever, MD  [facility] Pager [phone]  If 7PM-7AM, please contact night-coverage [email or web address] Password [email or web address] 06/30/2014, 6:10 PM   LOS: 6 days   HPI/Subjective: No events overnight.   Objective: Filed Vitals:   06/30/14 1044 06/30/14 1354 06/30/14 1515 06/30/14 1759  BP: 111/57 114/73  112/61  Pulse: 79 90    Temp: 97.7 F (36.5 C) 98.1 F (36.7 C)    TempSrc: Oral Oral    Resp: 26 26    Height:      Weight:      SpO2:  97% 97%     Intake/Output Summary (Last 24 hours) at 06/30/14 1810 Last data filed at 06/30/14 1350  Gross per 24  hour  Intake    540 ml  Output   1100 ml  Net   -560 ml    Exam:   General:  Pt is alert, follows commands appropriately, not in acute distress  Cardiovascular: Irregular rate and rhythm, no rubs, no gallops  Respiratory: Clear to auscultation bilaterally, no wheezing, no crackles, no rhonchi  Abdomen: Soft, non tender, non distended, bowel sounds present, no guarding  Extremities: +2 bilateral LE pitting edema, pulses DP and PT palpable bilaterally  Neuro: Grossly nonfocal  Data Reviewed: Basic Metabolic Panel:  Recent Labs Lab 06/26/14 0357 06/27/14 0450 06/28/14 0200 06/29/14 0445 06/30/14 0522  NA 133* 137 134* 134* 137  K 4.6 4.0 4.4 4.6 4.9  CL 96 99 96 94* 96  CO2 30 28 30 28 28   GLUCOSE 115* 121* 108* 122* 117*  BUN 41* 48* 47* 63* 84*  CREATININE 2.11* 1.93* 1.97* 2.02* 2.40*  CALCIUM 7.8* 8.3* 8.2* 8.5 8.7  MG  --  2.1  --   --   --    CBC:  Recent Labs Lab 06/25/14 0201 06/26/14 0357 06/27/14 0450 06/28/14 0200 06/29/14 0445 06/30/14 0522  WBC 10.8* 16.0* 14.7*  13.5* 9.6 10.9*  NEUTROABS 9.2* 14.8*  --   --   --   --   HGB 12.5* 12.5* 11.4* 11.2* 11.0* 11.3*  HCT 41.0 40.2 37.0* 36.6* 36.1* 36.2*  MCV 84.0 82.7 82.6 82.8 82.2 81.5  PLT 136* 101* 112* 108* 107* 130*   Cardiac Enzymes:  Recent Labs Lab 06/24/14 1419 06/24/14 2006 06/25/14 0201  TROPONINI 0.06* 0.06* 0.05*    Recent Results (from the past 240 hour(s))  Culture, blood (routine x 2)     Status: None (Preliminary result)   Collection Time: 06/26/14  8:20 AM  Result Value Ref Range Status   Specimen Description BLOOD LEFT HAND  Final   Special Requests BOTTLES DRAWN AEROBIC ONLY 3CC  Final   Culture   Final           BLOOD CULTURE RECEIVED NO GROWTH TO DATE CULTURE WILL BE HELD FOR 5 DAYS BEFORE ISSUING A FINAL NEGATIVE REPORT Performed at [facility]    Report Status PENDING  Incomplete  Culture, blood (routine x 2)     Status: None (Preliminary result)    Collection Time: 06/26/14  8:23 AM  Result Value Ref Range Status   Specimen Description BLOOD LEFT ARM  Final   Special Requests BOTTLES DRAWN AEROBIC AND ANAEROBIC 10CC  Final   Culture   Final           BLOOD CULTURE RECEIVED NO GROWTH TO DATE CULTURE WILL BE HELD FOR 5 DAYS BEFORE ISSUING A FINAL NEGATIVE REPORT Performed at [facility]    Report Status PENDING  Incomplete  Culture, Urine     Status: None   Collection Time: 06/26/14  3:47 PM  Result Value Ref Range Status   Specimen Description URINE, CATHETERIZED  Final   Special Requests NONE  Final   Colony Count NO GROWTH Performed at [facility]   Final   Culture NO GROWTH Performed at [facility]   Final   Report Status 06/27/2014 FINAL  Final     Scheduled Meds: . aspirin  81 mg Oral Daily  . buPROPion  300 mg Oral Daily  . carvedilol  12.5 mg Oral BID WC  . [START ON 07/01/2014] colchicine  0.3 mg Oral Daily  . diltiazem  30 mg Oral QID  . docusate sodium  100 mg Oral BID  . furosemide  40 mg Oral BID  . isosorbide mononitrate  15 mg Oral Daily  . levalbuterol  1.25 mg Nebulization 4 times per day  . mometasone-formoterol  2 puff Inhalation BID  . nicotine  21 mg Transdermal Daily  . pantoprazole  40 mg Oral Daily  . polyethylene glycol  17 g Oral Daily  . potassium chloride  20 mEq Oral BID  . rosuvastatin  5 mg Oral q1800  . senna  1 tablet Oral BID  . sodium chloride  3 mL Intravenous Q12H  . spironolactone  25 mg Oral Daily  . tiotropium  18 mcg Inhalation Daily  . Warfarin - Pharmacist Dosing Inpatient   Does not apply q1800   Continuous Infusions: . heparin 1,800 Units/hr (06/30/14 0517)

## 2014-07-01 ENCOUNTER — Inpatient Hospital Stay (HOSPITAL_COMMUNITY): Payer: Medicare PPO

## 2014-07-01 DIAGNOSIS — Z66 Do not resuscitate: Secondary | ICD-10-CM

## 2014-07-01 DIAGNOSIS — R140 Abdominal distension (gaseous): Secondary | ICD-10-CM

## 2014-07-01 DIAGNOSIS — R0600 Dyspnea, unspecified: Secondary | ICD-10-CM

## 2014-07-01 DIAGNOSIS — I5023 Acute on chronic systolic (congestive) heart failure: Secondary | ICD-10-CM

## 2014-07-01 LAB — PROTIME-INR
INR: 5.33 (ref 0.00–1.49)
Prothrombin Time: 49.2 seconds — ABNORMAL HIGH (ref 11.6–15.2)

## 2014-07-01 LAB — BASIC METABOLIC PANEL
Anion gap: 12 (ref 5–15)
BUN: 96 mg/dL — ABNORMAL HIGH (ref 6–23)
CO2: 26 mmol/L (ref 19–32)
CREATININE: 2.63 mg/dL — AB (ref 0.50–1.35)
Calcium: 8.3 mg/dL — ABNORMAL LOW (ref 8.4–10.5)
Chloride: 96 mmol/L (ref 96–112)
GFR calc Af Amer: 26 mL/min — ABNORMAL LOW (ref >90)
GFR calc non Af Amer: 22 mL/min — ABNORMAL LOW (ref >90)
Glucose, Bld: 125 mg/dL — ABNORMAL HIGH (ref 70–99)
Potassium: 4.8 mmol/L (ref 3.5–5.1)
SODIUM: 134 mmol/L — AB (ref 135–145)

## 2014-07-01 LAB — CBC
HCT: 34.1 % — ABNORMAL LOW (ref 39.0–52.0)
HEMOGLOBIN: 10.9 g/dL — AB (ref 13.0–17.0)
MCH: 25.8 pg — AB (ref 26.0–34.0)
MCHC: 32 g/dL (ref 30.0–36.0)
MCV: 80.8 fL (ref 78.0–100.0)
PLATELETS: 123 10*3/uL — AB (ref 150–400)
RBC: 4.22 MIL/uL (ref 4.22–5.81)
RDW: 16.8 % — AB (ref 11.5–15.5)
WBC: 9.5 10*3/uL (ref 4.0–10.5)

## 2014-07-01 LAB — HEPARIN LEVEL (UNFRACTIONATED): Heparin Unfractionated: 0.21 IU/mL — ABNORMAL LOW (ref 0.30–0.70)

## 2014-07-01 LAB — PROCALCITONIN: Procalcitonin: 10.77 ng/mL

## 2014-07-01 MED ORDER — FENTANYL CITRATE 0.05 MG/ML IJ SOLN
12.5000 ug | INTRAMUSCULAR | Status: DC | PRN
Start: 2014-07-01 — End: 2014-07-02
  Administered 2014-07-01 – 2014-07-02 (×3): 12.5 ug via INTRAVENOUS
  Filled 2014-07-01 (×3): qty 2

## 2014-07-01 MED ORDER — DEXMEDETOMIDINE HCL IN NACL 200 MCG/50ML IV SOLN: 0.4000 ug/kg/h | INTRAVENOUS | Status: DC

## 2014-07-01 MED ORDER — FUROSEMIDE 10 MG/ML IJ SOLN
40.0000 mg | Freq: Once | INTRAMUSCULAR | Status: AC
Start: 2014-07-01 — End: 2014-07-01
  Administered 2014-07-01: 40 mg via INTRAVENOUS
  Filled 2014-07-01: qty 4

## 2014-07-01 MED ORDER — SODIUM CHLORIDE 0.9 % IV SOLN: INTRAVENOUS | Status: DC

## 2014-07-01 MED ORDER — BISOPROLOL FUMARATE 5 MG PO TABS: 5.0000 mg | ORAL_TABLET | Freq: Every day | ORAL | Status: DC

## 2014-07-01 MED ORDER — PANTOPRAZOLE SODIUM 40 MG IV SOLR
40.0000 mg | INTRAVENOUS | Status: DC
  Administered 2014-07-01 – 2014-07-06 (×6): 40 mg via INTRAVENOUS
  Filled 2014-07-01 (×7): qty 40

## 2014-07-01 MED ORDER — FUROSEMIDE 10 MG/ML IJ SOLN
40.0000 mg | Freq: Once | INTRAMUSCULAR | Status: AC
  Administered 2014-07-01: 40 mg via INTRAVENOUS
  Filled 2014-07-01: qty 4

## 2014-07-01 MED ORDER — SODIUM CHLORIDE 0.9 % IV SOLN
INTRAVENOUS | Status: DC
  Administered 2014-07-01: 11:00:00 via INTRAVENOUS

## 2014-07-01 MED ORDER — IPRATROPIUM BROMIDE 0.02 % IN SOLN
0.5000 mg | Freq: Four times a day (QID) | RESPIRATORY_TRACT | Status: DC
  Administered 2014-07-01 – 2014-07-05 (×18): 0.5 mg via RESPIRATORY_TRACT
  Filled 2014-07-01 (×17): qty 2.5

## 2014-07-01 NOTE — Progress Notes (Signed)
CRITICAL VALUE ALERT  Critical value received: INR-5.33  Date of notification:  07/01/14  Time of notification: 6:47 AM  Critical value read back:Yes.    Nurse who received alert:  Judithe ModestLeeAnne Dontrell Stuck, Rn   MD notified (1st page):  Hospitalist  Time of first page:  6:48 AM \ Will call Pharm.

## 2014-07-01 NOTE — Progress Notes (Signed)
NG-tube placed per MD order, unable to give PO meds due to patient condition and reported off to ICU nurse Judeth CornfieldStephanie to F/U with plan of care and patient transferred to 1240, wife at the bedside.

## 2014-07-01 NOTE — Progress Notes (Signed)
Patient has noted  Bruising from a fall prior to admission on the abdomen and the rt lateral thigh.

## 2014-07-01 NOTE — Consult Note (Signed)
PULMONARY / CRITICAL CARE MEDICINE   Name: Nathan [name] MRN: [id number] DOB: 05/24/1940    ADMISSION DATE:  06/23/2014 CONSULTATION DATE:  2./20/16  REFERRING MD :  Dr Sherlon HandingIskra Aurther LoftM Myers  CHIEF COMPLAINT:  Acute REspiratory Failure  HISTORY OF PRESENT ILLNESS:  74 y.o. male with extensive CAD, COPD gold stagr 3  with ongoing tobacco abuse, chronic systolioc CHF, HTN, peripheral vascular disease, GERD, CKD stage III, recently hospitalized from 06/14/2014 to 06/18/2014 for an acute COPD exacerbation. According to wife was not given his po lasix ast discharge which resulted in significant pedal edema and asa  Result fell [facility] with MRI showing rt hip fracture non displaced and for conservativ mgmt.  2d later on 06/25/14 did have some resp disterss diagnosed as PE(negative DVT, intermediate prob VQ) and has been on antiocaulation since. On 06/28/14 also had A FIB RVR with cards managing. All along getting morphine prn for pain. And on 07/01/14 developed SBO with delirum acute and moved to ICU. Healthcare reports increaed dyspnea as well ? With worsening hypoxemia but wife thinks resp status stable on 5LNC. PCCM asked to evalaute  SIGNIFICANT EVENTS: 06/23/2014 - admit 2/13 - MRI right hip with non displaced ring obturator fracture, conservative mngt per ortho  2/14 - dyspnea, lasix started as CXR with more vascular congestion, transferred to SDU  2/15 - VQ scan with intermediate prob PE, heparin drip started with Coumadin bridging  2/17 - developed sudden onset a-fib  2/20 - lethargy, hypoxia, oxygen sat 91% on 5 L, abd distension, ? SBO, transfer to ICU, PCCM consulted, ABX XRAY pending       PAST MEDICAL HISTORY :   has a past medical history of Aorto-iliac disease; CAD (coronary artery disease); Hypertension; ED (erectile dysfunction); COPD, severe; Hyperlipidemia; Stroke; GERD (gastroesophageal reflux disease); Gout; Paroxysmal atrial flutter; Chronic combined  systolic and diastolic CHF (congestive heart failure); PAF (paroxysmal atrial fibrillation); CKD (chronic kidney disease), stage III; Tobacco abuse; History of noncompliance with medical treatment; Carotid artery disease; Anemia; Lower GI bleed; Mitral regurgitation; and Hypertensive heart disease.  has past surgical history that includes Spinal fusion; Hand surgery; Neck fusion (1975); Eye surgery; Coronary artery bypass graft (10/31/2011); Endarterectomy (10/31/2011); Carotid endarterectomy (10-31-2011); Colonoscopy (N/A, 10/30/2012); left heart catheterization with coronary angiogram (N/A, 09/04/2011); and left heart cath (N/A, 04/03/2012). Prior to Admission medications   Medication Sig Start Date End Date Taking? Authorizing Provider  albuterol (PROVENTIL HFA;VENTOLIN HFA) 108 (90 BASE) MCG/ACT inhaler Inhale 1-2 puffs into the lungs every 6 (six) hours as needed for wheezing or shortness of breath. 06/18/14  Yes Rhetta MuraJai-Gurmukh Samtani, MD  albuterol (PROVENTIL) (2.5 MG/3ML) 0.083% nebulizer solution Take 3 mLs (2.5 mg total) by nebulization every 4 (four) hours as needed for wheezing or shortness of breath. 06/06/14  Yes Geoffery Lyonsouglas Delo, MD  aspirin 81 MG chewable tablet Chew 1 tablet (81 mg total) by mouth daily. 01/05/14  Yes Jeralyn BennettEzequiel Zamora, MD  atorvastatin (LIPITOR) 10 MG tablet Take 1 tablet (10 mg total) by mouth daily at 6 PM. 01/05/14  Yes Jeralyn BennettEzequiel Zamora, MD  buPROPion (WELLBUTRIN XL) 300 MG 24 hr tablet Take 300 mg by mouth daily.   Yes Historical Provider, MD  carvedilol (COREG) 12.5 MG tablet Take 1 tablet (12.5 mg total) by mouth 2 (two) times daily with a meal. 06/18/14  Yes Rhetta MuraJai-Gurmukh Samtani, MD  colchicine 0.6 MG tablet Take 0.6 mg by mouth daily.   Yes Historical Provider, MD  eplerenone (  INSPRA) 25 MG tablet Take 0.5 tablets (12.5 mg total) by mouth daily. 06/20/14  Yes Lyn Records III, MD  furosemide (LASIX) 40 MG tablet Take 1 tablet (40 mg total) by mouth daily. 06/20/14  Yes Lyn Records  III, MD  isosorbide mononitrate (IMDUR) 30 MG 24 hr tablet Take 0.5 tablets (15 mg total) by mouth daily. 06/18/14  Yes Rhetta Mura, MD  Melatonin 10 MG TABS Take 10 mg by mouth at bedtime.   Yes Historical Provider, MD  mometasone-formoterol (DULERA) 100-5 MCG/ACT AERO Inhale 2 puffs into the lungs 2 (two) times daily. 06/18/14  Yes Rhetta Mura, MD  tiotropium (SPIRIVA) 18 MCG inhalation capsule Place 1 capsule (18 mcg total) into inhaler and inhale daily. 06/18/14  Yes Rhetta Mura, MD  nitroGLYCERIN (NITROSTAT) 0.4 MG SL tablet Place 0.4 mg under the tongue every 5 (five) minutes as needed for chest pain.    Historical Provider, MD   No Known Allergies  FAMILY HISTORY:  indicated that his mother is deceased. He indicated that his father is deceased.  SOCIAL HISTORY:  reports that he has been smoking Cigarettes.  He has a 64 pack-year smoking history. He has never used smokeless tobacco. He reports that he drinks about 3.6 oz of alcohol per week. He reports that he does not use illicit drugs.  REVIEW OF SYSTEMS:  Per HPI Otherwise not elicitibable    VITAL SIGNS: Temp:  [97.5 F (36.4 C)-98.2 F (36.8 C)] 97.8 F (36.6 C) (02/20 1119) Pulse Rate:  [75-97] 75 (02/20 1119) Resp:  [18-26] 20 (02/20 0446) BP: (95-114)/(58-73) 97/58 mmHg (02/20 1119) SpO2:  [79 %-97 %] 95 % (02/20 1119) FiO2 (%):  [55 %] 55 % (02/19 2105) Weight:  [78.5 kg (173 lb 1 oz)] 78.5 kg (173 lb 1 oz) (02/20 0500) HEMODYNAMICS:   VENTILATOR SETTINGS: Vent Mode:  [-]  FiO2 (%):  [55 %] 55 % INTAKE / OUTPUT:  Intake/Output Summary (Last 24 hours) at 07/01/14 1229 Last data filed at 07/01/14 1050  Gross per 24 hour  Intake    504 ml  Output   1400 ml  Net   -896 ml    PHYSICAL EXAMINATION: General:  Chronically ill looking male in bed. Looks uncomfortable Neuro:  Alert and Oriented x 2. Intermittently RASS +2 with confusion. Moves all 4s HEENT:  NG tube on . Bile draining  + Cardiovascular:  Normal heart sounds, SBP 90s, HR 86 Lungs:  Barrell chest, No wheeze Basal crack;es + Abdomen:  DIstended Musculoskeletal:  Mild edema Skin:  INtact anteriorly  LABS PULMONARY  Recent Labs Lab 06/25/14 1145  PHART 7.424  PCO2ART 43.7  PO2ART BELOW REPORTABLE RANGE.  HCO3 28.1*  TCO2 24.5  O2SAT 71.7    CBC  Recent Labs Lab 06/29/14 0445 06/30/14 0522 07/01/14 0545  HGB 11.0* 11.3* 10.9*  HCT 36.1* 36.2* 34.1*  WBC 9.6 10.9* 9.5  PLT 107* 130* 123*    COAGULATION  Recent Labs Lab 06/28/14 0200 06/29/14 0445 06/30/14 0522 07/01/14 0545  INR 1.05 1.59* 2.95* 5.33*    CARDIAC   Recent Labs Lab 06/24/14 1419 06/24/14 2006 06/25/14 0201  TROPONINI 0.06* 0.06* 0.05*   No results for input(s): PROBNP in the last 168 hours.   CHEMISTRY  Recent Labs Lab 06/27/14 0450 06/28/14 0200 06/29/14 0445 06/30/14 0522 07/01/14 0545  NA 137 134* 134* 137 134*  K 4.0 4.4 4.6 4.9 4.8  CL 99 96 94* 96 96  CO2 28 30 28  28 26  GLUCOSE 121* 108* 122* 117* 125*  BUN 48* 47* 63* 84* 96*  CREATININE 1.93* 1.97* 2.02* 2.40* 2.63*  CALCIUM 8.3* 8.2* 8.5 8.7 8.3*  MG 2.1  --   --   --   --    Estimated Creatinine Clearance: 26.9 mL/min (by C-G formula based on Cr of 2.63).   LIVER  Recent Labs Lab 06/28/14 0200 06/29/14 0445 06/30/14 0522 07/01/14 0545  INR 1.05 1.59* 2.95* 5.33*     INFECTIOUS No results for input(s): LATICACIDVEN, PROCALCITON in the last 168 hours.   ENDOCRINE CBG (last 3)  No results for input(s): GLUCAP in the last 72 hours.       IMAGING x48h Dg Abd Acute W/chest  07/01/2014   CLINICAL DATA:  Dyspnea. CHF Coronary artery disease, hypertension and COPD.  EXAM: ACUTE ABDOMEN SERIES (ABDOMEN 2 VIEW & CHEST 1 VIEW)  COMPARISON:  06/26/2014  FINDINGS: The heart size is enlarged. There are bilateral pleural effusions noted right greater than left. Bilateral airspace opacities are noted which appear unchanged  from previous exam. Marked colonic distention is identified. There is gas identified throughout the colon up to the level of the rectum. No free intraperitoneal air are identified.  IMPRESSION: 1. Marked colonic distention which may reflect distal bowel obstruction or colonic ileus. No free air noted. 2. Congestive heart failure and bilateral airspace opacities.   Electronically Signed   By: Signa Kell M.D.   On: 07/01/2014 10:30         ASSESSMENT / PLAN:  PULMONARY  A #Baseline Gold Stage 3 copd. Not on home o2.  Doubt following with anyone in [place] PCCM #Currentl;y  - in hospital on Rx for Itnermediate prob PE with antiocaulation   - woprsening hyoxemia since 06/26/14 - multifactorial due to Acutge on chronic CHF, Possible PE, and Abd distension   P:   O2 Nebs IAnticoagulation per pharmacy (dc warfarindue to SBO and INR > 5,)  CARDIOVASCULAR  A:chronic systolic chf and IHD. Might be worse this admit P:  Dc all po cardiac meds IV lasix x 1 and rest per cards   RENAL   A:  Worsenig=ng acute on chronic renal insuff P:   Monitor Per primary team  GASTROINTESTINAL A:  SBO - ? Due to opioids following Rt Hip Fracture P:   Dc morpphine NPO DC po meds NG to LIS  HEMATOLOGIC A:  Anemia of critical illness  P:  PRBC for hgb < 7gm%  INFECTIOUS BCx2  UC  Sputum   A:  At risk for HCAP  P:   Check PCT and lactate Anti-infectives    None       ENDOCRINE A:  AT risk for hyperglycemia  P:   ssi per [facility]  NEUROLOGIC A:  Intermittent delirium P:   RASS goal: 0 Precedex gtt if he gets   FAMILY  - Updates: wife uopdated at bedside. PCCM consult. [facility] primary      Dr. Kalman Shan, M.D., [facility].C.P Pulmonary and Critical Care Medicine Staff Physician [facility] Pager: [phone], If no answer or between  15:00h - 7:00h: call [phone]  07/01/2014 1:27 PM

## 2014-07-01 NOTE — Progress Notes (Signed)
ANTICOAGULATION CONSULT NOTE - Follow Up Consult  Pharmacy Consult for Heparin/warfarin Indication:  pulmonary embolus  No Known Allergies  Patient Measurements: Height: 6\' 1"  (185.4 cm) Weight: 173 lb 1 oz (78.5 kg) IBW/kg (Calculated) : 79.9 Heparin Dosing Weight: actual weight  Vital Signs: Temp: 97.5 F (36.4 C) (02/20 0446) Temp Source: Oral (02/20 0446) BP: 107/71 mmHg (02/20 0800) Pulse Rate: 81 (02/20 0800)  Labs:  Recent Labs  06/29/14 0445 06/30/14 0522 07/01/14 0545  HGB 11.0* 11.3* 10.9*  HCT 36.1* 36.2* 34.1*  PLT 107* 130* 123*  LABPROT 19.1* 31.0* 49.2*  INR 1.59* 2.95* 5.33*  HEPARINUNFRC 0.28* 0.54 0.21*  CREATININE 2.02* 2.40* 2.63*    Estimated Creatinine Clearance: 26.9 mL/min (by C-G formula based on Cr of 2.63).   Assessment: 74 yo male admitted 06/23/14 s/p fall. He was recently admitted 2/3-06/18/14 for an acute COPD exacerbation. On 06/25/14, he developed acute respiratory distress, pO2 below reportable range and elevated D-dimer. Prophylactic Lovenox (last dose 2/13 ~2200) was discontinued. Pltc noted to be chronically low. VQ scan has returned with intermediate probability for PE and LE dopplers negative for DVT. Pharmacy consulted to dose IV heparin & warfarin for PE.   2/13 baseline INR = 1.32   Today, 07/01/2014:  Heparin level 0.21, SUBtherapeutic on heparin infusion at 1800 units/hr.  Heparin infusing at ordered rate and RN states no issues with infusion.  INR 5.33, SUPRAtherapeutic with large rate of rise from 2/17-2/20 following Warfarin doses of  5mg , 6mg , 4mg , 0mg   CBC: Hgb 10.9 (stable), Pltc 123 (chronically low)  RN indicates no bleeding issues currently  Diet: Regular - intake poor  Drug-Drug interactions with warfarin: NONE (on ASA 81mg  with h/o CAD)  SCr elevated and rising, with h/o CKD - DOACs not preferred  Goal of Therapy:  INR 2-3 Heparin level 0.3-0.7 units/ml Monitor platelets by anticoagulation protocol: Yes      Plan:   Per discussion with Dr. Lenise ArenaMeyers, discontinue heparin gtt now (done ~ 0815) given supratherapeutic INR of 5.33.  NO warfarin tonight given supratherapeutic INR.  Suspect poor oral intake is contributing.  Consider dietician evaluation.   Daily INR and CBC.  Resume Warfarin when INR < 3.   Greer PickerelJigna Kimi Kroft, PharmD, BCPS Pager: [phone] 07/01/2014 08:32 AM

## 2014-07-01 NOTE — Progress Notes (Signed)
Patient, alert to self, C/O abd pain, abdomen distended, vomited, incontinent of bowel, oxygen saturation in the 87-90%-5L of oxygen, PRN Zofran given and Dr. Izola PriceMyers notified, assessed patient and wrote some orders. Family at the bedside notified of plan of care. Will continue to assess patient.

## 2014-07-01 NOTE — Progress Notes (Signed)
Pharmacy - Anticoagulation note  Assessment: 73yoM on heparin/warfarin for new PE, AFib.  INR supratherapeutic this AM, stopped heparin warfarin.  Now with signs of SBO.  Plan: Given concerns for SBO,PCCM would like to restart IV heparin once INR no longer supratherapeutic, instead of PO warfarin.  Bernadene Personrew Stacy Deshler, PharmD Pager: [phone] 07/01/2014, 2:42 PM

## 2014-07-01 NOTE — Progress Notes (Signed)
Patient Name: Nathan [name] Date of Encounter: 07/01/2014     Principal Problem:   Acute respiratory distress Active Problems:   COPD (chronic obstructive pulmonary disease), gold stage C.   Hyperlipidemia   S/P CABG (coronary artery bypass graft)   Chronic kidney disease (CKD), stage III (moderate)   Tobacco use disorder   PVD (peripheral vascular disease)   Essential hypertension   PAF (paroxysmal atrial fibrillation)   CAD (coronary artery disease)   Chronic combined systolic and diastolic CHF (congestive heart failure)   Anemia   Hypertensive heart disease   Right hip pain   Fall   Ear lobe laceration   Hip pain, acute   Acute hip pain   Acute on chronic systolic CHF (congestive heart failure)   Dislocation of hip, obturator, right, closed   Sacral fracture, closed   Pulmonary embolus: Probable    SUBJECTIVE Nathan [name] is a 74 y.o. male with history of CAD s/p CABG, AFL s/p ablation (details unclear),PAF (last documented 04/2012 - not on [facility]), chronic combined heart failure, hypertension, severe COPD with ongoing tobacco use previously on home O2, CKD (baseline Cr 1.5-1.7), stroke, GERD, PAD (LE stenting 2005, R CEA 2013), and medication noncompliance.  He fell last week and suffered a pelvic fracture. He has had progressive dyspnea and has had a markely elevated D-dimer. VQ scan done was intermediate probability. Showed 2 peripheral wedge shaped defects in the RLL. Patient states he does not feel well this morning.  Still very short of breath.  He also has developed abdominal distention and discomfort.  He remains in atrial fibrillation.  Rate is well controlled.  He is on warfarin.  INR is supratherapeutic at 5.33  CURRENT MEDS . aspirin  81 mg Oral Daily  . buPROPion  300 mg Oral Daily  . carvedilol  12.5 mg Oral BID WC  . colchicine  0.3 mg Oral Daily  . diltiazem  30 mg Oral QID  . docusate sodium  100 mg Oral BID  . isosorbide mononitrate   15 mg Oral Daily  . levalbuterol  1.25 mg Nebulization 4 times per day  . mometasone-formoterol  2 puff Inhalation BID  . nicotine  21 mg Transdermal Daily  . pantoprazole  40 mg Oral Daily  . polyethylene glycol  17 g Oral Daily  . potassium chloride  20 mEq Oral BID  . rosuvastatin  5 mg Oral q1800  . senna  1 tablet Oral BID  . sodium chloride  3 mL Intravenous Q12H  . spironolactone  25 mg Oral Daily  . tiotropium  18 mcg Inhalation Daily  . Warfarin - Pharmacist Dosing Inpatient   Does not apply q1800    OBJECTIVE  Filed Vitals:   07/01/14 0446 07/01/14 0500 07/01/14 0800 07/01/14 0835  BP: 95/60  107/71   Pulse: 93  81   Temp: 97.5 F (36.4 C)     TempSrc: Oral     Resp: 20     Height:      Weight:  173 lb 1 oz (78.5 kg)    SpO2: 93%   91%    Intake/Output Summary (Last 24 hours) at 07/01/14 0911 Last data filed at 07/01/14 0400  Gross per 24 hour  Intake    804 ml  Output   1400 ml  Net   -596 ml   Filed Weights   06/29/14 0436 06/30/14 0516 07/01/14 0500  Weight: 175 lb 11.3 oz (79.7 kg) 168 lb 14  oz (76.6 kg) 173 lb 1 oz (78.5 kg)    PHYSICAL EXAM  General: Pleasant, NAD.  Nasal oxygen in place Neuro:  Moves all extremities spontaneously. HEENT:  Normal  Neck: Supple without bruits or JVD. Lungs:  Diffuse rhonchi and wheezing bilaterally Heart: Distant heart tones.  No murmur gallop or rub.  Irregular pulse. Abdomen: Moderate generalized tenderness.  Distention. Extremities: Soft chronic pitting edema both feet  Accessory Clinical Findings  CBC  Recent Labs  06/30/14 0522 07/01/14 0545  WBC 10.9* 9.5  HGB 11.3* 10.9*  HCT 36.2* 34.1*  MCV 81.5 80.8  PLT 130* 123*   Basic Metabolic Panel  Recent Labs  06/30/14 0522 07/01/14 0545  NA 137 134*  K 4.9 4.8  CL 96 96  CO2 28 26  GLUCOSE 117* 125*  BUN 84* 96*  CREATININE 2.40* 2.63*  CALCIUM 8.7 8.3*   Liver Function Tests No results for input(s): AST, ALT, ALKPHOS, BILITOT, PROT,  ALBUMIN in the last 72 hours. No results for input(s): LIPASE, AMYLASE in the last 72 hours. Cardiac Enzymes No results for input(s): CKTOTAL, CKMB, CKMBINDEX, TROPONINI in the last 72 hours. BNP Invalid input(s): POCBNP D-Dimer No results for input(s): DDIMER in the last 72 hours. Hemoglobin A1C No results for input(s): HGBA1C in the last 72 hours. Fasting Lipid Panel No results for input(s): CHOL, HDL, LDLCALC, TRIG, CHOLHDL, LDLDIRECT in the last 72 hours. Thyroid Function Tests No results for input(s): TSH, T4TOTAL, T3FREE, THYROIDAB in the last 72 hours.  Invalid input(s): FREET3  TELE  Atrial fibrillation with controlled ventricular response     Radiology/Studies  Dg Chest 2 View  06/14/2014   CLINICAL DATA:  Worsening shortness of breath  EXAM: CHEST  2 VIEW  COMPARISON:  06/06/2014  FINDINGS: Bilateral pleural effusions, moderate on the right and small on the left. The left effusion has slightly increased from comparison study, with loculation along the lateral and posterior chest wall. The underlying lung is obscured at the right base. There are pleural calcifications at the bilateral apex, but none definitely seen at the bases.  Stable heart size and aortic contours post CABG.  No pulmonary edema.  No pneumothorax.  IMPRESSION: Moderate right and small left pleural effusions. The partially loculated left pleural effusion has mildly increased since 06/06/2014.   Electronically Signed   By: Tiburcio Pea M.D.   On: 06/14/2014 18:29   Dg Chest 2 View (if Patient Has Fever And/or Copd)  06/06/2014   CLINICAL DATA:  Chest pain and shortness of breath  EXAM: CHEST  2 VIEW  COMPARISON:  04/23/2014  FINDINGS: Cardiac shadow is stable but mildly enlarged. Postsurgical changes are again seen. Bilateral pleural effusions are noted which appears stable when compare with the prior study. Likely underlying atelectasis is present. No new focal abnormality is seen.  IMPRESSION: Stable  bilateral pleural effusions. No new focal abnormality is noted.   Electronically Signed   By: Alcide Clever M.D.   On: 06/06/2014 19:40   Ct Head Wo Contrast  06/23/2014   CLINICAL DATA:  Larey Seat about 8 hr ago now with injury to the right side of the head and ear.  EXAM: CT HEAD WITHOUT CONTRAST  TECHNIQUE: Contiguous axial images were obtained from the base of the skull through the vertex without intravenous contrast.  COMPARISON:  09/19/2011; 09/11/2011  FINDINGS: Similar findings of atrophy with diffuse sulcal prominence, centralized volume loss and mild commensurate expected dilatation of the ventricular system. Scattered periventricular hypodensities are  unchanged compatible with microvascular ischemic disease. The gray-white differentiation is otherwise well maintained without CT evidence of acute large territory infarct. No intraparenchymal or extra-axial mass or hemorrhage. Unchanged size and configuration of the ventricles and basilar cisterns. No midline shift. Intracranial atherosclerosis. Limited visualization of the paranasal sinuses and mastoid air cells is normal. No air-fluid levels. Regional soft tissues appear normal. Post left-sided cataract surgery.  IMPRESSION: Atrophy and microvascular ischemic disease without acute intracranial process.   Electronically Signed   By: Simonne ComeJohn  Watts M.D.   On: 06/23/2014 15:01   Ct Chest Wo Contrast  06/14/2014   CLINICAL DATA:  74 year old male with progressive shortness of breath over the past month and bilateral pleural effusions  EXAM: CT CHEST WITHOUT CONTRAST  TECHNIQUE: Multidetector CT imaging of the chest was performed following the standard protocol without IV contrast.  COMPARISON:  Prior chest x-ray 06/14/2014  FINDINGS: Mediastinum: Unremarkable thyroid gland. Mediastinal lymph nodes remain within normal limits for size. High right paratracheal node measures 9 mm in short axis on image 17 of series 2. Unremarkable thoracic esophagus.  Heart/Vascular:  Limited evaluation in the absence of intravenous contrast. Conventional 3 vessel arch anatomy. Scattered atherosclerotic calcifications including coronary artery disease. Evidence of prior multivessel CABG. The heart is enlarged. No pericardial effusion.  Lungs/Pleura: Focal calcified pleural plaques in the posteromedial aspect of the left lower thorax and at the right lung apex. Small and partially loculated left pleural effusion with pleural fluid extending through the fissure. Moderate right pleural effusion. Associated dependent atelectasis in the right greater than left lower lobes. No pulmonary edema, focal airspace consolidation or suspicious pulmonary nodule. Diffuse mild lower lobe bronchial wall thickening.  Bones/Soft Tissues: Chronic nonunion of the median sternotomy. The cerclage wires remain intact. No acute fracture or aggressive appearing lytic or blastic osseous lesion.  Upper Abdomen: Visualized upper abdominal organs are unremarkable.  IMPRESSION: 1. Moderate right and small low but loculated left pleural effusions. No definitive etiology for the pleural fluid is identified by CT imaging. Given the partial loculation on the left, chronic effusions perhaps related to prior episodes of congestive heart failure is favored. 2. Associated chronic right greater than left lower lobe atelectasis. 3. 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 This may predispose to chronic pleural effusion formation. 4. Atherosclerosis including coronary artery disease. 5. Chronic nonunion of the median sternotomy. Cerclage wires remain intact. 6. Diffuse mild lower lobe bronchial wall thickening.   Electronically Signed   By: Malachy MoanHeath  McCullough M.D.   On: 06/14/2014 19:34   Mr Hip Right Wo Contrast  06/24/2014   CLINICAL DATA:  RIGHT hip pain.  Recent fall.  Occult fracture.  EXAM: MR OF THE RIGHT HIP WITHOUT CONTRAST  TECHNIQUE: Multiplanar, multisequence MR imaging  was performed. No intravenous contrast was administered.  COMPARISON:  Radiographs 06/23/2014.  FINDINGS: Nondisplaced RIGHT obturator ring fractures are present. Fracture in the mid RIGHT inferior pubic ramus. There is also a fracture of the root of the RIGHT superior pubic ramus. Additionally, there is a complementary RIGHT sacral ala fracture. All of these fractures are nondisplaced and or radiographically occult. Anasarca is present with diffuse subcutaneous edema. LEFT gluteal muscular edema compatible with strain. There is also bilateral RIGHT-greater-than-LEFT adductor compartment strain and reactive edema. Gluteal tendons appear intact. Edema is present in the RIGHT obturator internus and externus consistent with strain and reactive edema from adjacent fractures.  There is no proximal femur fracture. No hip effusion.  Negative for bursitis. LEFT inguinal hernia contains a portion of the sigmoid colon.  IMPRESSION: 1. Nondisplaced RIGHT obturator ring fractures. Complementary nondisplaced RIGHT sacral ala fracture. 2. Bilateral hip girdle muscular strain. 3. Anasarca up. 4. LEFT inguinal hernia containing a loop of sigmoid colon.   Electronically Signed   By: Andreas Newport M.D.   On: 06/24/2014 08:26   Nm Pulmonary Perf And Vent  06/26/2014   CLINICAL DATA:  Respiratory distress  EXAM: NUCLEAR MEDICINE VENTILATION - PERFUSION LUNG SCAN  TECHNIQUE: Ventilation images were obtained in multiple projections using inhaled aerosol technetium 99 M DTPA. Perfusion images were obtained in multiple projections after intravenous injection of Tc-60m MAA.  RADIOPHARMACEUTICALS:  Fifty mCi Tc-57m DTPA aerosol and 5.5 mCi Tc-67m MAA  COMPARISON:  Chest x-ray from the same day.  FINDINGS: There are 2 peripheral and wedge-shaped defects in the right lower lobe, 1 large and the other moderate, located in the posterior and apical segments respectively. The largest is triple matched based on chest x-ray from today; the  ventilation defect is larger in the apical defect. Poor ventilation in the left lower lobe, with better perfusion, correlating with pleural fluid and airspace disease on preceding chest x-ray. There could be a small mismatched defect in the lingula based on left lateral imaging. The lung contours are altered in the setting of bilateral pleural effusions.  IMPRESSION: Intermediate probability for pulmonary embolism by PIOPED II. Bibasilar airspace disease and pleural effusions significantly limits the utility of V/Q scan.   Electronically Signed   By: Marnee Spring M.D.   On: 06/26/2014 12:00   Dg Chest Port 1 View  06/26/2014   CLINICAL DATA:  Dyspnea  EXAM: PORTABLE CHEST - 1 VIEW  COMPARISON:  06/25/2014  FINDINGS: There is unchanged cardiomegaly. There is improvement, with less confluent consolidation in the lateral left lung although ground-glass opacities persist bilaterally. There is no change in the moderate right pleural effusion.  IMPRESSION: Slight improvement in the left lung consolidation. Persistent edema and right pleural effusion.   Electronically Signed   By: Ellery Plunk M.D.   On: 06/26/2014 06:14   Dg Chest Port 1 View  06/25/2014   CLINICAL DATA:  Patient with shortness of breath. History of coronary artery disease and hypertension.  EXAM: PORTABLE CHEST - 1 VIEW  COMPARISON:  06/24/2014  FINDINGS: Multiple monitoring leads overlie the patient. The left costophrenic angle is excluded from view. Stable enlarged cardiac and mediastinal contours status post median sternotomy and CABG procedure. Bilateral mid and lower lung heterogeneous pulmonary opacities, left greater than right. Small to moderate right pleural effusion. Probable layering left pleural effusion, although the left lung base is excluded from view. No definite pneumothorax.  IMPRESSION: Bilateral mid to lower lung heterogeneous opacities, left greater than right, with associated moderate right and probable small left  pleural effusions. Findings may represent pulmonary edema, infection or underlying atelectasis.   Electronically Signed   By: Annia Belt M.D.   On: 06/25/2014 12:20   Dg Chest Port 1 View  06/24/2014   CLINICAL DATA:  Congestive heart failure. Coronary artery disease. COPD. Hypertension. Atrial flutter and fibrillation.  EXAM: PORTABLE CHEST - 1 VIEW  COMPARISON:  06/14/2014  FINDINGS: Moderate cardiomegaly is stable. Small pleural effusions are seen bilaterally which has decreased in size since previous study. Decreased atelectasis also noted in the right lung base. Prior CABG again noted.  IMPRESSION: Decreased small bilateral pleural effusions and right basilar atelectasis.  Stable cardiomegaly.  Electronically Signed   By: Myles Rosenthal M.D.   On: 06/24/2014 17:08   Dg Hip Unilat  With Pelvis 2-3 Views Right  06/23/2014   CLINICAL DATA:  Status post fall today while getting out of bed; right hip pain  EXAM: RIGHT HIP (WITH PELVIS) 2-3 VIEWS  COMPARISON:  Supine abdominal film of January 04, 2014 which includes a portion of the hips  FINDINGS: The bony pelvis is osteopenic. No acute pelvic fracture is demonstrated. The hip joint spaces are preserved. The observed portions of the sacrum are normal. There are common iliac stents bilaterally.  AP and frog-leg lateral views of the right hip reveal the joint space to be normal. The acetabulum is intact. The femoral head, neck, and intertrochanteric regions are normal in appearance.  IMPRESSION: There is no acute bony abnormality of the right hip.   Electronically Signed   By: David  [place]   On: 06/23/2014 14:05    ASSESSMENT AND PLAN   1. Acute respiratory failure: Related to his severe underlying COPD and pulmonary embolus. Continue supportive care. He is very short of breath of breath. Increased work of breathing. This seems to be more related to his underlying COPD and the acute PE than cardiac issues. His HR is well controlled.  He was still  smoking when he came to the hosptial.   2. pulmonary embolus: He is on coumadin .  3. Atrial fibrillation: Has new onset atrial fib. This is likely related to his severe COPD and the pulmonary embolus. His HR is well controlled.  Echo reveals mild LV dysfunction with EF of 40%. He is having more wheezing today.  We will change to bisoprolol.   4. CAD : His last cath was in Nov. 2013 that shows: patent LIMA to LAD, SVG -RCA. The SVG - OM and the SVG - Diag were occluded. His respiratory distress over the past several days is not consistent with an acute coronary syndrome  5. CHK - stage III - plans per Int. Med.   6. Acute on chronic systolic CHF : Has improved with diuresis. He has severe COPD and has significant leg edema.   7.  Abdominal pain and distention: Plans per internal medicine.  Signed, Cassell Clement MD

## 2014-07-01 NOTE — Progress Notes (Signed)
eLink Physician-Brief Progress Note Patient Name: Nathan [name] DOB: 03/23/1941 MRN: [id number]   Date of Service  07/01/2014  HPI/Events of Note  Worsening sats, bilateral crackles reported   Intake/Output Summary (Last 24 hours) at 07/01/14 2059 Last data filed at 07/01/14 1752  Gross per 24 hour  Intake    504 ml  Output   1050 ml  Net   -546 ml          eICU Interventions  Try lasix 40 mg IV      Intervention Category Major Interventions: Respiratory failure - evaluation and management  Sandrea HughsMichael Sofiah Lyne 07/01/2014, 8:59 PM

## 2014-07-01 NOTE — Progress Notes (Addendum)
Patient ID: Nathan [name], male   DOB: Jan 05, 1941, 74 y.o.   MRN: [id number]  [facility] PROGRESS NOTE  Nathan [name] WUJ:[id number] DOB: 1940/05/22 DOA: 06/23/2014 PCP: Lillia Mountain, MD   Brief narrative:    74 y.o. male with extensive CAD, COPD with ongoing tobacco abuse, chronic CHF, HTN, peripheral vascular disease, GERD, CKD stage III, recently hospitalized from 06/14/2014 to 06/18/2014 for an acute COPD exacerbation, now presented back to [facility] ED on 2/12 after an episode of fall that occurred several hours prior to this admission while he was trying to go to the bathroom. He stood up and his right leg gave away and he fell on his right side, subsequently unable to stand up and bear weight.   In ED, pt was hemodynamically stable, right hip diagnostic studies negative for fractures or dislocations. Blood work notable for Cr 1.61, WBC 11K. Pt admitted for further evaluation due to ongoing hip pain.   Major events since admission: 2/13 - MRI right hip with non displaced ring obturator fracture, conservative mngt per ortho  2/14 - dyspnea, lasix started as CXR with more vascular congestion, transferred to SDU  2/15 - VQ scan with intermediate prob PE, heparin drip started with Coumadin bridging  2/17 - developed sudden onset a-fib  2/20 - lethargy, hypoxia, oxygen sat 91% on 5 L, abd distension, ? SBO, transfer to ICU, PCCM consulted, ABX XRAY pending   Assessment/Plan:    Acute respiratory failure with hypoxia - initially thought to be secondary to acute on chronic systolic CHF, PE, COPD (GOLD stage III) - This morning worsening respiratory status with tachypnea, hypoxia, oxygen saturation 91% on 5 L, more wheezing on exam - Requested chest x-ray, ? COPD  - Transfer to ICU, PCCM consulted Abdominal distention with one episode of vomiting 2/20 - Worrisome for obstruction, abdominal x-ray requested - Keep nothing by mouth for now, place NGT - per nursing staff had BM  this AM and yesterday  Acute on chronic systolic heart failure - repeat 2 D ECHO this admission with EF 40% - was initially on Lasix 60 mg IV BID - transitioned to oral Lasix 60 mg BID 2/17 and weight continued trending down - no lasix given this AM due to hypotension - weight trending: 187 lbs 2/14 --> 179 --> 175 --> 168 --> 173 lbs this AM - Currently also on bisoprolol, Cardizem, Imdur, Aldactone - LE swelling has improved  A-fib with RVR - developed AM of 2/17  - possibly triggered by duonebs - changed albuterol to levalbuterol 2/17, rate is now better controlled  - Patient has been on heparin and bridged to Coumadin, INR 5.3 this morning - cardiology following  Acute encephalopathy - this AM 2/20, unclear if related to ? SBO and acute COPD - transfer to ICU as noted above  Intermediate probability of pulmonary emboli - continued Heparin with transition to Coumadin per pharmacy - Significant increase in INR over the past 24 hours, 5.33 this morning  - holding Coumadin and heparin  CAD status post CABG/acute on chronic systolic heart failure - on Coreg, Aldactone, Imdur, Cardizem  - may need to hold based on BP stability  Chronic kidney disease stage III - Cr trending: 2.11 --> 1.97 --> 2.02 --> 2.40 --> 2.63 - hold Lasix this AM and give small bolus of IVF NS 500 cc for now Leukocytosis - determined to be secondary to reactive process  - WBC trending: 16 --> 14.7 --> 13.5 --> 9.6 --> 9.5 -  remains afebrile, follow up on CXR Tobacco abuse - provide nicotine patch  Anemia of chronic disease, thrombocytopenia - no signs of active bleeding, Hg relatively stable  - Plt count improving  - CBC in AM Right obturator ring fracture/right sacral and left fractures/bilateral hip girdle muscular strain/Right hip pain - nondisplaced right-sided pelvic ring disruption  - nonweightbearing right lower extremity, okay to weightbearing per ortho  - should be a self-limited problem   - per ortho rec's, will need follow-up with Dr. August Saucer in 3 weeks for repeat radiographs to make sure everything is healing Ear lobe laceration - Status post repair  Prophylaxis - PPI for GI prophylaxis. Coumadin for a-fib now on hold due to supra therapeutic INR   Code Status: Full.  Family Communication: plan of care discussed with wife at bedside, son called over the phone (awaiting call back) Disposition Plan: transfer to ICU  IV access:  Peripheral IV  Procedures and diagnostic studies:    Nm Pulmonary Perf/Vent 06/26/2014 Intermediate probability for pulmonary embolism by PIOPED II. Bibasilar airspace disease and pleural effusions significantly limits the utility of V/Q scan.   Dg Chest Port 1 View 06/26/2014 Slight improvement in the left lung consolidation. Persistent edema and right pleural effusion.   Dg Chest Port 1 View 06/25/2014 Bilateral mid to lower lung heterogeneous opacities, left greater than right, with associated moderate right and probable small left pleural effusions. Findings may represent pulmonary edema, infection or underlying atelectasis.   Dg Chest Port 1 View 06/24/2014 Decreased small bilateral pleural effusions and right basilar atelectasis. Stable cardiomegaly.   Mr Hip Right Wo Contrast 06/24/2014 Nondisplaced RIGHT obturator ring fractures. Complementary nondisplaced RIGHT sacral ala fracture. Bilateral hip girdle muscular strain. Anasarca up. LEFT inguinal hernia containing a loop of sigmoid colon.   CXR 06/14/2014 Moderate right and small left pleural effusions, loculated left pleural effusion increased since 06/06/2014.   Dg Hip Unilat 06/23/2014 There is no acute bony abnormality of the right hip.   Ct Head Wo Contrast 06/23/2014 Atrophy and microvascular ischemic disease without acute intracranial process  Medical Consultants:  [facility]M Cardiology   Other Consultants:  None   IAnti-Infectives:   None   Debbora Presto, MD  [facility] Pager [phone] Cell [phone]  If 7PM-7AM, please contact night-coverage [email or web address] Password [facility] 07/01/2014, 10:23 AM   LOS: 7 days   HPI/Subjective: More lethargic this AM, RR in mid 20's, oxygen sat 91% on 5 L, abd distension.   Objective: Filed Vitals:   07/01/14 0500 07/01/14 0800 07/01/14 0835 07/01/14 1013  BP:  107/71  98/61  Pulse:  81    Temp:      TempSrc:      Resp:      Height:      Weight: 78.5 kg (173 lb 1 oz)     SpO2:   91%     Intake/Output Summary (Last 24 hours) at 07/01/14 1023 Last data filed at 07/01/14 0400  Gross per 24 hour  Intake    804 ml  Output   1100 ml  Net   -296 ml    Exam:   General:  Pt is somnolent but able to follow most of the commands, in mild distress due to pain  Cardiovascular: Irregular rate and rhythm, no rubs, no gallops  Respiratory: Diminished breath sounds at bases, RR in 20's, exp wheezing noted with scattered rhonchi   Abdomen: Distended, very faint bowel sounds, tender in epigastric area  Extremities: +1 bilateral pitting edema, pulses  DP and PT palpable bilaterally   Data Reviewed: Basic Metabolic Panel:  Recent Labs Lab 06/27/14 0450 06/28/14 0200 06/29/14 0445 06/30/14 0522 07/01/14 0545  NA 137 134* 134* 137 134*  K 4.0 4.4 4.6 4.9 4.8  CL 99 96 94* 96 96  CO2 28 30 28 28 26   GLUCOSE 121* 108* 122* 117* 125*  BUN 48* 47* 63* 84* 96*  CREATININE 1.93* 1.97* 2.02* 2.40* 2.63*  CALCIUM 8.3* 8.2* 8.5 8.7 8.3*  MG 2.1  --   --   --   --   CBC:  Recent Labs Lab 06/25/14 0201 06/26/14 0357 06/27/14 0450 06/28/14 0200 06/29/14 0445 06/30/14 0522 07/01/14 0545  WBC 10.8* 16.0* 14.7* 13.5* 9.6 10.9* 9.5  NEUTROABS 9.2* 14.8*  --   --   --   --   --   HGB 12.5* 12.5* 11.4* 11.2* 11.0* 11.3* 10.9*  HCT 41.0 40.2 37.0* 36.6* 36.1* 36.2* 34.1*  MCV 84.0 82.7 82.6 82.8 82.2 81.5 80.8  PLT 136* 101* 112* 108* 107* 130* 123*   Cardiac Enzymes:  Recent Labs Lab  06/24/14 1419 06/24/14 2006 06/25/14 0201  TROPONINI 0.06* 0.06* 0.05*    Recent Results (from the past 240 hour(s))  Culture, blood (routine x 2)     Status: None (Preliminary result)   Collection Time: 06/26/14  8:20 AM  Result Value Ref Range Status   Specimen Description BLOOD LEFT HAND  Final   Special Requests BOTTLES DRAWN AEROBIC ONLY 3CC  Final   Culture   Final           BLOOD CULTURE RECEIVED NO GROWTH TO DATE CULTURE WILL BE HELD FOR 5 DAYS BEFORE ISSUING A FINAL NEGATIVE REPORT Performed at [facility]    Report Status PENDING  Incomplete  Culture, blood (routine x 2)     Status: None (Preliminary result)   Collection Time: 06/26/14  8:23 AM  Result Value Ref Range Status   Specimen Description BLOOD LEFT ARM  Final   Special Requests BOTTLES DRAWN AEROBIC AND ANAEROBIC 10CC  Final   Culture   Final           BLOOD CULTURE RECEIVED NO GROWTH TO DATE CULTURE WILL BE HELD FOR 5 DAYS BEFORE ISSUING A FINAL NEGATIVE REPORT Performed at [facility]    Report Status PENDING  Incomplete  Culture, Urine     Status: None   Collection Time: 06/26/14  3:47 PM  Result Value Ref Range Status   Specimen Description URINE, CATHETERIZED  Final   Special Requests NONE  Final   Colony Count NO GROWTH Performed at [facility]   Final   Culture NO GROWTH Performed at [facility]   Final   Report Status 06/27/2014 FINAL  Final     Scheduled Meds: . aspirin  81 mg Oral Daily  . bisoprolol  5 mg Oral Daily  . buPROPion  300 mg Oral Daily  . colchicine  0.3 mg Oral Daily  . diltiazem  30 mg Oral QID  . docusate sodium  100 mg Oral BID  . isosorbide mononitrate  15 mg Oral Daily  . levalbuterol  1.25 mg Nebulization 4 times per day  . mometasone-formoterol  2 puff Inhalation BID  . nicotine  21 mg Transdermal Daily  . pantoprazole  40 mg Oral Daily  . polyethylene glycol  17 g Oral Daily  . potassium chloride  20 mEq Oral BID  .  rosuvastatin  5 mg Oral q1800  . senna  1 tablet Oral BID  . sodium chloride  3 mL Intravenous Q12H  . spironolactone  25 mg Oral Daily  . tiotropium  18 mcg Inhalation Daily  . Warfarin - Pharmacist Dosing Inpatient   Does not apply q1800   Continuous Infusions: . sodium chloride

## 2014-07-02 ENCOUNTER — Inpatient Hospital Stay (HOSPITAL_COMMUNITY): Payer: Medicare PPO

## 2014-07-02 DIAGNOSIS — R140 Abdominal distension (gaseous): Secondary | ICD-10-CM | POA: Insufficient documentation

## 2014-07-02 DIAGNOSIS — R0600 Dyspnea, unspecified: Secondary | ICD-10-CM | POA: Insufficient documentation

## 2014-07-02 DIAGNOSIS — Z515 Encounter for palliative care: Secondary | ICD-10-CM

## 2014-07-02 DIAGNOSIS — Z66 Do not resuscitate: Secondary | ICD-10-CM

## 2014-07-02 DIAGNOSIS — R0603 Acute respiratory distress: Secondary | ICD-10-CM | POA: Insufficient documentation

## 2014-07-02 LAB — BLOOD GAS, ARTERIAL
ACID-BASE EXCESS: 2.4 mmol/L — AB (ref 0.0–2.0)
Bicarbonate: 26.4 mEq/L — ABNORMAL HIGH (ref 20.0–24.0)
DELIVERY SYSTEMS: POSITIVE
Expiratory PAP: 5
FIO2: 0.8 %
Inspiratory PAP: 12
Mode: POSITIVE
O2 SAT: 99.2 %
PCO2 ART: 40.6 mmHg (ref 35.0–45.0)
PH ART: 7.429 (ref 7.350–7.450)
Patient temperature: 98.6
TCO2: 24.1 mmol/L (ref 0–100)
pO2, Arterial: 178 mmHg — ABNORMAL HIGH (ref 80.0–100.0)

## 2014-07-02 LAB — CBC
HCT: 34.1 % — ABNORMAL LOW (ref 39.0–52.0)
Hemoglobin: 10.7 g/dL — ABNORMAL LOW (ref 13.0–17.0)
MCH: 25.7 pg — AB (ref 26.0–34.0)
MCHC: 31.4 g/dL (ref 30.0–36.0)
MCV: 81.8 fL (ref 78.0–100.0)
Platelets: 127 10*3/uL — ABNORMAL LOW (ref 150–400)
RBC: 4.17 MIL/uL — ABNORMAL LOW (ref 4.22–5.81)
RDW: 17 % — ABNORMAL HIGH (ref 11.5–15.5)
WBC: 6.7 10*3/uL (ref 4.0–10.5)

## 2014-07-02 LAB — PROTIME-INR
INR: 5.73 (ref 0.00–1.49)
Prothrombin Time: 52 seconds — ABNORMAL HIGH (ref 11.6–15.2)

## 2014-07-02 LAB — BASIC METABOLIC PANEL
Anion gap: 12 (ref 5–15)
BUN: 119 mg/dL — ABNORMAL HIGH (ref 6–23)
CALCIUM: 8.6 mg/dL (ref 8.4–10.5)
CHLORIDE: 100 mmol/L (ref 96–112)
CO2: 27 mmol/L (ref 19–32)
Creatinine, Ser: 2.94 mg/dL — ABNORMAL HIGH (ref 0.50–1.35)
GFR calc Af Amer: 23 mL/min — ABNORMAL LOW (ref >90)
GFR, EST NON AFRICAN AMERICAN: 19 mL/min — AB (ref >90)
Glucose, Bld: 110 mg/dL — ABNORMAL HIGH (ref 70–99)
Potassium: 4.7 mmol/L (ref 3.5–5.1)
SODIUM: 139 mmol/L (ref 135–145)

## 2014-07-02 LAB — LACTIC ACID, PLASMA: LACTIC ACID, VENOUS: 1.5 mmol/L (ref 0.5–2.0)

## 2014-07-02 LAB — URINE MICROSCOPIC-ADD ON

## 2014-07-02 LAB — URINALYSIS, ROUTINE W REFLEX MICROSCOPIC
BILIRUBIN URINE: NEGATIVE
Glucose, UA: NEGATIVE mg/dL
Ketones, ur: NEGATIVE mg/dL
Leukocytes, UA: NEGATIVE
NITRITE: POSITIVE — AB
Protein, ur: 30 mg/dL — AB
SPECIFIC GRAVITY, URINE: 1.017 (ref 1.005–1.030)
Urobilinogen, UA: 1 mg/dL (ref 0.0–1.0)
pH: 5 (ref 5.0–8.0)

## 2014-07-02 LAB — PHOSPHORUS: PHOSPHORUS: 6 mg/dL — AB (ref 2.3–4.6)

## 2014-07-02 LAB — CULTURE, BLOOD (ROUTINE X 2)
CULTURE: NO GROWTH
Culture: NO GROWTH

## 2014-07-02 LAB — MAGNESIUM: MAGNESIUM: 2.9 mg/dL — AB (ref 1.5–2.5)

## 2014-07-02 LAB — BRAIN NATRIURETIC PEPTIDE: B Natriuretic Peptide: 703.3 pg/mL — ABNORMAL HIGH (ref 0.0–100.0)

## 2014-07-02 LAB — PROCALCITONIN: Procalcitonin: 23.66 ng/mL

## 2014-07-02 MED ORDER — FENTANYL CITRATE 0.05 MG/ML IJ SOLN
25.0000 ug | INTRAMUSCULAR | Status: DC | PRN
  Administered 2014-07-02 – 2014-07-03 (×6): 50 ug via INTRAVENOUS
  Filled 2014-07-02 (×6): qty 2

## 2014-07-02 MED ORDER — FUROSEMIDE 10 MG/ML IJ SOLN: 80.0000 mg | Freq: Once | INTRAMUSCULAR | Status: DC

## 2014-07-02 MED ORDER — CETYLPYRIDINIUM CHLORIDE 0.05 % MT LIQD
7.0000 mL | Freq: Two times a day (BID) | OROMUCOSAL | Status: DC
  Administered 2014-07-04 – 2014-07-10 (×9): 7 mL via OROMUCOSAL

## 2014-07-02 MED ORDER — VANCOMYCIN HCL IN DEXTROSE 750-5 MG/150ML-% IV SOLN
750.0000 mg | INTRAVENOUS | Status: DC
  Administered 2014-07-02 – 2014-07-04 (×3): 750 mg via INTRAVENOUS
  Filled 2014-07-02 (×4): qty 150

## 2014-07-02 MED ORDER — METOPROLOL TARTRATE 1 MG/ML IV SOLN
2.5000 mg | INTRAVENOUS | Status: DC | PRN
  Filled 2014-07-02: qty 5

## 2014-07-02 MED ORDER — PIPERACILLIN-TAZOBACTAM 3.375 G IVPB
3.3750 g | Freq: Three times a day (TID) | INTRAVENOUS | Status: DC
  Administered 2014-07-02 – 2014-07-05 (×9): 3.375 g via INTRAVENOUS
  Filled 2014-07-02 (×10): qty 50

## 2014-07-02 MED ORDER — PIPERACILLIN-TAZOBACTAM 3.375 G IVPB 30 MIN
3.3750 g | Freq: Once | INTRAVENOUS | Status: AC
  Administered 2014-07-02: 3.375 g via INTRAVENOUS
  Filled 2014-07-02: qty 50

## 2014-07-02 MED ORDER — CHLORHEXIDINE GLUCONATE 0.12 % MT SOLN
15.0000 mL | Freq: Two times a day (BID) | OROMUCOSAL | Status: DC
  Administered 2014-07-03 – 2014-07-10 (×8): 15 mL via OROMUCOSAL
  Filled 2014-07-02 (×17): qty 15

## 2014-07-02 NOTE — Progress Notes (Addendum)
Patient ID: Nathan [name], male   DOB: 1941-05-01, 74 y.o.   MRN: [id number]  [facility] PROGRESS NOTE  Nathan [name]:[id number] DOB: 1941-02-21 DOA: 06/23/2014 PCP: Lillia Mountain, MD   Brief narrative:    74 y.o. male with extensive CAD, COPD with ongoing tobacco abuse, chronic CHF, HTN, peripheral vascular disease, GERD, CKD stage III, recently hospitalized from 06/14/2014 to 06/18/2014 for an acute COPD exacerbation, now presented back to [facility] ED on 2/12 after an episode of fall that occurred several hours prior to this admission while he was trying to go to the bathroom. He stood up and his right leg gave away and he fell on his right side, subsequently unable to stand up and bear weight.   In ED, pt was hemodynamically stable, right hip diagnostic studies negative for fractures or dislocations. Blood work notable for Cr 1.61, WBC 11K. Pt admitted for further evaluation due to ongoing hip pain.   Major events since admission: 2/13 - MRI right hip with non displaced ring obturator fracture, conservative mngt per ortho  2/14 - dyspnea, lasix started as CXR with more vascular congestion, transferred to SDU  2/15 - VQ scan with intermediate prob PE, heparin drip started with Coumadin bridging  2/17 - developed sudden onset a-fib  2/20 - lethargy, hypoxia, oxygen sat 91% on 5 L, abd distension, ? SBO, transfer to ICU, PCCM consulted, ABX XRAY pending  2/21 - still lethargic, currently on BIPAP, started Zosyn and Vanc, overnight as pct > 10, palliative care team consulted   Assessment/Plan:    Acute respiratory failure with hypoxia - multifactorial and secondary to acute on chronic systolic CHF, PE, COPD (GOLD stage III) - pt currently on BiPAP, ABG looks better but pt remains lethargic - pt is very frail, poor response to current medical therapy - appreciate PCCM following, will also consult palliative care team  Abdominal distention with one episode of vomiting  2/20 - abd still distended but better, NGT in place - keep NPO  Sepsis, severe, unclear source - HR in 110 - 120's this AM with RR in 30's, pct > 10 and 23 this AM, zosyn and vanc started overnight - no clear source, UA requested, blood cultures requested  - will be difficult to dose vanc with worsening renal failure  Acute on chronic systolic heart failure - repeat 2 D ECHO this admission with EF 40% - was initially on Lasix 60 mg IV BID - transitioned to oral Lasix 60 mg BID 2/17 and weight continued trending down - has gotten Lasix 40 mg IV x 2 doses on 2/20, renal function worse this AM - weight trending: 187 lbs 2/14 --> 179 --> 175 --> 168 --> 173 --> 169 lbs this AM A-fib with RVR - developed AM of 2/17  - changed albuterol to levalbuterol 2/17 - Patient has been on heparin and bridged to Coumadin but has not been given since INR 5.7 this morning - cardiology following, appreciate assistance  Acute encephalopathy - still lethargic, on BiPAP, likely multifactorial and secondary to Sepsis, SBO, COPD, systolic CHF - transfer to ICU as noted above  Intermediate probability of pulmonary emboli - Significant increase in INR over the past 24 hours, 5.7 this morning  - holding Coumadin and heparin  CAD status post CABG/acute on chronic systolic heart failure - per cardiology, added metoprolol IV as needed  Acute on chronic kidney disease stage III - Cr trending: 2.11 --> 1.97 --> 2.02 --> 2.40 --> 2.63 -->  2.94 Leukocytosis - determined to be secondary to reactive process  - WBC trending: 16 --> 14.7 --> 13.5 --> 9.6 --> 9.5 --> 6.7 Tobacco abuse - provide nicotine patch  Anemia of chronic disease, thrombocytopenia - no signs of active bleeding, Hg relatively stable  - Plt count stable   Right obturator ring fracture/right sacral and left fractures/bilateral hip girdle muscular strain/Right hip pain - nondisplaced right-sided pelvic ring disruption  - nonweightbearing  right lower extremity, okay to weightbearing per ortho  - should be a self-limited problem  - per ortho rec's, will need follow-up with Dr. August Saucer in 3 weeks for repeat radiographs to make sure everything is healing Ear lobe laceration - Status post repair Severe PCM - in the context of acute illness, has been NPO and will remain NPO for now   Prophylaxis - PPI for GI prophylaxis. Coumadin for a-fib now on hold due to supra therapeutic INR   Code Status: Full.  Family Communication: plan of care discussed with wife, confirmed Full code for now but will discuss GOC further when she arrives  Disposition Plan: transfer to ICU  IV access:  Peripheral IV  Procedures and diagnostic studies:    CXR  07/02/2014   Nasogastric tube tip in the stomach, with side-port in the EG junction. This should be advanced for optimal placement.  Central and basilar airspace opacities without significant interval change. There also are unchanged pleural effusions.     Dg Abd Acute W/chest  07/01/2014 Marked colonic distention which may reflect distal bowel obstruction or colonic ileus. No free air noted. 2. Congestive heart failure and bilateral airspace opacities.     Nm Pulmonary Perf/Vent 06/26/2014 Intermediate probability for pulmonary embolism by PIOPED II. Bibasilar airspace disease and pleural effusions significantly limits the utility of V/Q scan.   CXR 06/26/2014 Slight improvement in the left lung consolidation. Persistent edema and right pleural effusion.   CXR2/14/2016 Bilateral mid to lower lung heterogeneous opacities, left greater than right, with associated moderate right and probable small left pleural effusions. Findings may represent pulmonary edema, infection or underlying atelectasis.   CXR 06/24/2014 Decreased small bilateral pleural effusions and right basilar atelectasis. Stable cardiomegaly.   Mr Hip Right Wo Contrast 06/24/2014 Nondisplaced RIGHT obturator ring  fractures. Complementary nondisplaced RIGHT sacral ala fracture. Bilateral hip girdle muscular strain. Anasarca up. LEFT inguinal hernia containing a loop of sigmoid colon.   CXR 06/14/2014 Moderate right and small left pleural effusions, loculated left pleural effusion increased since 06/06/2014.   Dg Hip Unilat 06/23/2014 There is no acute bony abnormality of the right hip.   Ct Head Wo Contrast 06/23/2014 Atrophy and microvascular ischemic disease without acute intracranial process  Medical Consultants:  PCCM Cardiology Palliative care team   Other Consultants:  None   IAnti-Infectives:   Zosyn 2/20 --> Vancomycin 2/20 -->    Debbora Presto, MD  [facility] Pager [phone]  If 7PM-7AM, please contact night-coverage [email or web address] Password [facility] 07/02/2014, 7:46 AM   LOS: 8 days   HPI/Subjective: Still lethargic, on BiPAP, started on Vanc and Zosyn   Objective: Filed Vitals:   07/02/14 0400 07/02/14 0500 07/02/14 0600 07/02/14 0700  BP: 113/51 108/61 129/61 113/63  Pulse: 100 106 106 99  Temp: 98 F (36.7 C)     TempSrc: Axillary     Resp: 29 29 29 31   Height:      Weight:    77 kg (169 lb 12.1 oz)  SpO2: 95% 99% 99% 99%    Intake/Output  Summary (Last 24 hours) at 07/02/14 0746 Last data filed at 07/02/14 0700  Gross per 24 hour  Intake      0 ml  Output   1700 ml  Net  -1700 ml    Exam:   General:  Pt is on BiPAP, lethargic  Cardiovascular: Irregular rate and rhythm,  no rubs, no gallops  Respiratory: Rhonchi's bilaterally with crackles at bases  Abdomen: Soft, non tender, still distended but better, no guarding  Extremities: +1 bilateral LE pitting edema, pulses DP and PT palpable bilaterally  Data Reviewed: Basic Metabolic Panel:  Recent Labs Lab 06/27/14 0450 06/28/14 0200 06/29/14 0445 06/30/14 0522 07/01/14 0545 07/02/14 0410  NA 137 134* 134* 137 134* 139  K 4.0 4.4 4.6 4.9 4.8 4.7  CL 99 96 94* 96 96 100  CO2 28 30 28 28  26 27   GLUCOSE 121* 108* 122* 117* 125* 110*  BUN 48* 47* 63* 84* 96* 119*  CREATININE 1.93* 1.97* 2.02* 2.40* 2.63* 2.94*  CALCIUM 8.3* 8.2* 8.5 8.7 8.3* 8.6  MG 2.1  --   --   --   --  2.9*  PHOS  --   --   --   --   --  6.0*   CBC:  Recent Labs Lab 06/26/14 0357  06/28/14 0200 06/29/14 0445 06/30/14 0522 07/01/14 0545 07/02/14 0410  WBC 16.0*  < > 13.5* 9.6 10.9* 9.5 6.7  NEUTROABS 14.8*  --   --   --   --   --   --   HGB 12.5*  < > 11.2* 11.0* 11.3* 10.9* 10.7*  HCT 40.2  < > 36.6* 36.1* 36.2* 34.1* 34.1*  MCV 82.7  < > 82.8 82.2 81.5 80.8 81.8  PLT 101*  < > 108* 107* 130* 123* 127*  < > = values in this interval not displayed.   Recent Results (from the past 240 hour(s))  Culture, blood (routine x 2)     Status: None (Preliminary result)   Collection Time: 06/26/14  8:20 AM  Result Value Ref Range Status   Specimen Description BLOOD LEFT HAND  Final   Special Requests BOTTLES DRAWN AEROBIC ONLY 3CC  Final   Culture   Final           BLOOD CULTURE RECEIVED NO GROWTH TO DATE CULTURE WILL BE HELD FOR 5 DAYS BEFORE ISSUING A FINAL NEGATIVE REPORT Performed at [facility]    Report Status PENDING  Incomplete  Culture, blood (routine x 2)     Status: None (Preliminary result)   Collection Time: 06/26/14  8:23 AM  Result Value Ref Range Status   Specimen Description BLOOD LEFT ARM  Final   Special Requests BOTTLES DRAWN AEROBIC AND ANAEROBIC 10CC  Final   Culture   Final           BLOOD CULTURE RECEIVED NO GROWTH TO DATE CULTURE WILL BE HELD FOR 5 DAYS BEFORE ISSUING A FINAL NEGATIVE REPORT Performed at [facility]    Report Status PENDING  Incomplete  Culture, Urine     Status: None   Collection Time: 06/26/14  3:47 PM  Result Value Ref Range Status   Specimen Description URINE, CATHETERIZED  Final   Special Requests NONE  Final   Colony Count NO GROWTH Performed at [facility]   Final   Culture NO GROWTH Performed at [facility]   Final   Report Status 06/27/2014 FINAL  Final  Scheduled Meds: . aspirin  81 mg Oral Daily  . ipratropium  0.5 mg Nebulization Q6H  . levalbuterol  1.25 mg Nebulization 4 times per day  . nicotine  21 mg Transdermal Daily  . pantoprazole (PROTONIX) IV  40 mg Intravenous Q24H  . piperacillin-tazobactam (ZOSYN)  IV  3.375 g Intravenous 3 times per day  . piperacillin-tazobactam  3.375 g Intravenous Once  . potassium chloride  20 mEq Oral BID  . sodium chloride  3 mL Intravenous Q12H   Continuous Infusions: . dexmedetomidine

## 2014-07-02 NOTE — Progress Notes (Signed)
ANTIBIOTIC CONSULT NOTE - INITIAL  Pharmacy Consult for vancomycin Indication: pneumonia  No Known Allergies  Patient Measurements: Height:  (185.4 cm) Weight: 169 lb 12.1 oz (77 kg) IBW/kg (Calculated) : 79.9  Vital Signs: Temp: 98 F (36.7 C) (02/21 0400) Temp Source: Axillary (02/21 0400) BP: 113/63 mmHg (02/21 0700) Pulse Rate: 99 (02/21 0700) Intake/Output from previous day: 02/20 0701 - 02/21 0700 In: -  Out: 1700 [Urine:1200; Emesis/NG output:500] Intake/Output from this shift:    Labs:  Recent Labs  06/30/14 0522 07/01/14 0545 07/02/14 0410  WBC 10.9* 9.5 6.7  HGB 11.3* 10.9* 10.7*  PLT 130* 123* 127*  CREATININE 2.40* 2.63* 2.94*   Estimated Creatinine Clearance: 23.6 mL/min (by C-G formula based on Cr of 2.94). No results for input(s): VANCOTROUGH, VANCOPEAK, VANCORANDOM, GENTTROUGH, GENTPEAK, GENTRANDOM, TOBRATROUGH, TOBRAPEAK, TOBRARND, AMIKACINPEAK, AMIKACINTROU, AMIKACIN in the last 72 hours.   Microbiology: Recent Results (from the past 720 hour(s))  Culture, blood (routine x 2)     Status: None (Preliminary result)   Collection Time: 06/26/14  8:20 AM  Result Value Ref Range Status   Specimen Description BLOOD LEFT HAND  Final   Special Requests BOTTLES DRAWN AEROBIC ONLY 3CC  Final   Culture   Final           BLOOD CULTURE RECEIVED NO GROWTH TO DATE CULTURE WILL BE HELD FOR 5 DAYS BEFORE ISSUING A FINAL NEGATIVE REPORT Performed at [facility]    Report Status PENDING  Incomplete  Culture, blood (routine x 2)     Status: None (Preliminary result)   Collection Time: 06/26/14  8:23 AM  Result Value Ref Range Status   Specimen Description BLOOD LEFT ARM  Final   Special Requests BOTTLES DRAWN AEROBIC AND ANAEROBIC 10CC  Final   Culture   Final           BLOOD CULTURE RECEIVED NO GROWTH TO DATE CULTURE WILL BE HELD FOR 5 DAYS BEFORE ISSUING A FINAL NEGATIVE REPORT Performed at [facility]    Report Status PENDING   Incomplete  Culture, Urine     Status: None   Collection Time: 06/26/14  3:47 PM  Result Value Ref Range Status   Specimen Description URINE, CATHETERIZED  Final   Special Requests NONE  Final   Colony Count NO GROWTH Performed at [facility]   Final   Culture NO GROWTH Performed at [facility]   Final   Report Status 06/27/2014 FINAL  Final    Medical History: Past Medical History  Diagnosis Date  . Aorto-iliac disease     a. s/p bilat with stent 2007 - followed by [facility].  . CAD (coronary artery disease)     a. 10/2011 s/p CABG x4 (LIMA-LAD, SVG-Diag, SVG-OM1 and SVG-PDA)  b. 03/2012 NSTEMI s/p LHC SVG-RCA and LIMA-LAD but total occlusion of SVG-OM as well as SVG-diag. RX therapy recommended   . Hypertension   . ED (erectile dysfunction)   . COPD, severe   . Hyperlipidemia   . Stroke   . GERD (gastroesophageal reflux disease)   . Gout   . Paroxysmal atrial flutter     ablation  . Chronic combined systolic and diastolic CHF (congestive heart failure)     a. Last echo 04/2014: EF 40-45%.  Marland Kitchen PAF (paroxysmal atrial fibrillation)   . CKD (chronic kidney disease), stage III   . Tobacco abuse   . History of noncompliance with medical treatment   . Carotid artery  disease     a. RCEA 2013 - followed by [facility].  . Anemia   . Lower GI bleed     a. LGIB 10/2012: Colonoscopy performed June 21 showed blood in the colon but no definite source identified, diverticular versus AVM suspected. Aspirin stopped.  . Mitral regurgitation   . Hypertensive heart disease     Medications:  Scheduled:  . aspirin  81 mg Oral Daily  . ipratropium  0.5 mg Nebulization Q6H  . levalbuterol  1.25 mg Nebulization 4 times per day  . nicotine  21 mg Transdermal Daily  . pantoprazole (PROTONIX) IV  40 mg Intravenous Q24H  . piperacillin-tazobactam (ZOSYN)  IV  3.375 g Intravenous 3 times per day  . piperacillin-tazobactam  3.375 g Intravenous Once  . potassium chloride  20 mEq Oral BID   . sodium chloride  3 mL Intravenous Q12H   Infusions:  . dexmedetomidine     Assessment: 73yo already known to pharmacy for elevated INR/warfarin dosing. Now with elevated procalc and bilateral infiltrates to start vancomycin per pharmacy and Zosyn ordered per Md for possible HAP  2/21 >> vancomcyin >> 2/21 >> Zosyn >>    Tmax: afebrile WBCs: 6.7 Renal: Scr 2.94, up. CrCl 24 ml/min Procalc: 23  2/21 blood: ordered 2/15 urine: ngf 2/15 blood: ngtd   Goal of Therapy:  Vancomycin trough level 15-20 mcg/ml  Plan:  1) Vancomycin 750mg  IV q24 2) Continue Zosyn as ordered by Md   Hessie KnowsJustin M Roselene Gray, PharmD, BCPS Pager [phone] 07/02/2014 7:50 AM   Berkley HarveyLegge, Noelie Renfrow Marshall 07/02/2014,7:45 AM

## 2014-07-02 NOTE — Progress Notes (Signed)
Spoke to wife again  - son is not her biological son but patient's. Not seen patient x 2 years. She feels patient and she want patient to go home. She feels son's input into this decision is not relevant because of not great relationship. Having said that she wants to respect son presence during family meeting. Goal ideall to go home - she is very open to ideal of home hospice  Dr. Kalman ShanMurali Graeson Nouri, M.D., [facility] Pulmonary and Critical Care Medicine Staff Physician [facility]monary and Critical Care Pager: [phone], If no answer or between  15:00h - 7:00h: call [phone]  07/02/2014 12:22 PM

## 2014-07-02 NOTE — Progress Notes (Addendum)
PULMONARY / CRITICAL CARE MEDICINE   Name: Nathan [name] MRN: [id number] DOB: 1940/08/07    ADMISSION DATE:  06/23/2014 CONSULTATION DATE:  2./20/16  REFERRING MD :  Dr Sherlon Handing Aurther Loft  CHIEF COMPLAINT:  Acute REspiratory Failure  HISTORY OF PRESENT ILLNESS:  74 y.o. male with extensive CAD, COPD gold stagr 3  with ongoing tobacco abuse, chronic systolioc CHF, HTN, peripheral vascular disease, GERD, CKD stage III, recently hospitalized from 06/14/2014 to 06/18/2014 for an acute COPD exacerbation. According to wife was not given his po lasix ast discharge which resulted in significant pedal edema and asa  Result fell pesented back to [facility] ED on 2/12 with MRI showing rt hip fracture non displaced and for conservativ mgmt.  2d later on 06/25/14 did have some resp disterss diagnosed as PE(negative DVT, intermediate prob VQ) and has been on antiocaulation since. On 06/28/14 also had A FIB RVR with cards managing. All along getting morphine prn for pain. And on 07/01/14 developed SBO with delirum acute and moved to ICU. Healthcare reports increaed dyspnea as well ? With worsening hypoxemia but wife thinks resp status stable on 5LNC. PCCM asked to evalaute   has a past medical history of Aorto-iliac disease; CAD (coronary artery disease); Hypertension; ED (erectile dysfunction); COPD, severe; Hyperlipidemia; Stroke; GERD (gastroesophageal reflux disease); Gout; Paroxysmal atrial flutter; Chronic combined systolic and diastolic CHF (congestive heart failure); PAF (paroxysmal atrial fibrillation); CKD (chronic kidney disease), stage III; Tobacco abuse; History of noncompliance with medical treatment; Carotid artery disease; Anemia; Lower GI bleed; Mitral regurgitation; and Hypertensive heart disease.   SIGNIFICANT EVENTS: 06/23/2014 - admit 2/13 - MRI right hip with non displaced ring obturator fracture, conservative mngt per ortho  2/14 - dyspnea, lasix started as CXR with more vascular congestion,  transferred to SDU  2/15 - VQ scan with intermediate prob PE, heparin drip started with Coumadin bridging  2/17 - developed sudden onset a-fib  2/20 - lethargy, hypoxia, oxygen sat 91% on 5 L, abd distension, ? SBO, transfer to ICU, PCCM consulted, ABX XRAY pending    SUBJECTIVE/OVERNIGHT/INTERVAL HX 07/02/14 - overnight worswening hypoxemia - needed bipap from 3am to 11am, now on NRB - worse than before. Palliative care consult - DNR, appears concurrent comfort + basic medical care is goal.  PCT very high and aBX started. Creat rising Wants to go hoime. Wants his cat at bedside. Wants to drink beer.  Wife confirms dnar + concurrent palliation + basic medical care + possibility of going home. They are awaiting son from [place], [place] before further discussions of goals    VITAL SIGNS: Temp:  [97.6 F (36.4 C)-98.7 F (37.1 C)] 97.6 F (36.4 C) (02/21 0800) Pulse Rate:  [78-112] 105 (02/21 1100) Resp:  [23-45] 27 (02/21 1100) BP: (95-133)/(42-78) 107/58 mmHg (02/21 1100) SpO2:  [87 %-100 %] 97 % (02/21 1100) FiO2 (%):  [55 %-80 %] 80 % (02/21 0800) Weight:  [77 kg (169 lb 12.1 oz)-79 kg (174 lb 2.6 oz)] 77 kg (169 lb 12.1 oz) (02/21 0700) HEMODYNAMICS:   VENTILATOR SETTINGS: Vent Mode:  [-]  FiO2 (%):  [55 %-80 %] 80 % INTAKE / OUTPUT:  Intake/Output Summary (Last 24 hours) at 07/02/14 1200 Last data filed at 07/02/14 1100  Gross per 24 hour  Intake    255 ml  Output   1560 ml  Net  -1305 ml    PHYSICAL EXAMINATION: General:  Chronically ill looking male in bed. Looks more comfortable Neuro:  Alert and Oriented  x 3. HEENT:  NG tube on . Bile draining + Cardiovascular:  Normal heart sounds, SBP 90s, HR 86 Lungs:  Barrell chest, No wheeze Basal crack;es +,. Using face mask o2 +  Abdomen:  DIstended but less so and softer Musculoskeletal:  Mild edema Skin:  INtact anteriorly  LABS PULMONARY  Recent Labs Lab 07/02/14 0808  PHART 7.429  PCO2ART 40.6  PO2ART 178.0*   HCO3 26.4*  TCO2 24.1  O2SAT 99.2    CBC  Recent Labs Lab 06/30/14 0522 07/01/14 0545 07/02/14 0410  HGB 11.3* 10.9* 10.7*  HCT 36.2* 34.1* 34.1*  WBC 10.9* 9.5 6.7  PLT 130* 123* 127*    COAGULATION  Recent Labs Lab 06/28/14 0200 06/29/14 0445 06/30/14 0522 07/01/14 0545 07/02/14 0410  INR 1.05 1.59* 2.95* 5.33* 5.73*    CARDIAC  No results for input(s): TROPONINI in the last 168 hours. No results for input(s): PROBNP in the last 168 hours.   CHEMISTRY  Recent Labs Lab 06/27/14 0450 06/28/14 0200 06/29/14 0445 06/30/14 0522 07/01/14 0545 07/02/14 0410  NA 137 134* 134* 137 134* 139  K 4.0 4.4 4.6 4.9 4.8 4.7  CL 99 96 94* 96 96 100  CO2 28 30 28 28 26 27   GLUCOSE 121* 108* 122* 117* 125* 110*  BUN 48* 47* 63* 84* 96* 119*  CREATININE 1.93* 1.97* 2.02* 2.40* 2.63* 2.94*  CALCIUM 8.3* 8.2* 8.5 8.7 8.3* 8.6  MG 2.1  --   --   --   --  2.9*  PHOS  --   --   --   --   --  6.0*   Estimated Creatinine Clearance: 23.6 mL/min (by C-G formula based on Cr of 2.94).   LIVER  Recent Labs Lab 06/28/14 0200 06/29/14 0445 06/30/14 0522 07/01/14 0545 07/02/14 0410  INR 1.05 1.59* 2.95* 5.33* 5.73*     INFECTIOUS  Recent Labs Lab 07/01/14 0545 07/02/14 0410  LATICACIDVEN  --  1.5  PROCALCITON 10.77 23.66     ENDOCRINE CBG (last 3)  No results for input(s): GLUCAP in the last 72 hours.       IMAGING x48h Dg Chest Port 1 View  07/02/2014   CLINICAL DATA:  Dyspnea  EXAM: PORTABLE CHEST - 1 VIEW  COMPARISON:  07/01/2014  FINDINGS: There is a nasogastric tube with tip in the stomach but side-port at the EG junction.  There is moderate cardiomegaly, unchanged. There are airspace opacities in the central and basilar regions bilaterally. There are a effusions, right greater than left.  IMPRESSION: Nasogastric tube tip in the stomach, with side-port in the EG junction. This should be advanced for optimal placement.  Central and basilar airspace  opacities without significant interval change. There also are unchanged pleural effusions.   Electronically Signed   By: Ellery Plunk M.D.   On: 07/02/2014 04:27   Dg Abd Acute W/chest  07/01/2014   CLINICAL DATA:  Dyspnea. CHF Coronary artery disease, hypertension and COPD.  EXAM: ACUTE ABDOMEN SERIES (ABDOMEN 2 VIEW & CHEST 1 VIEW)  COMPARISON:  06/26/2014  FINDINGS: The heart size is enlarged. There are bilateral pleural effusions noted right greater than left. Bilateral airspace opacities are noted which appear unchanged from previous exam. Marked colonic distention is identified. There is gas identified throughout the colon up to the level of the rectum. No free intraperitoneal air are identified.  IMPRESSION: 1. Marked colonic distention which may reflect distal bowel obstruction or colonic ileus. No free air noted. 2.  Congestive heart failure and bilateral airspace opacities.   Electronically Signed   By: Signa Kellaylor  Stroud M.D.   On: 07/01/2014 10:30         ASSESSMENT / PLAN:  PULMONARY  A #Baseline Gold Stage 3 copd. Not on home o2.  Doubt following with anyone in [facility] P[facility]M #Currentl;y  - in hospital on Rx for Itnermediate prob PE with antiocaulation   - woprsening hyoxemia since 06/26/14 - multifactorial due to Acutge on chronic CHF, Possible PE, and Abd distension  -needed bipap for few hours 07/02/14   P:   O2 Nebs IAnticoagulation per pharmacy (dc warfarindue to SBO and INR > 5,) BiPAP as needed for resp distress and palliation - BiPAP is good tool for resp comfort Opioids prn for dyspnea  CARDIOVASCULAR  A:chronic systolic chf and IHD. Might be worse this admit P:  Per cards   RENAL Acute Kidney Injury (any one) Increase in SCr by > 0.3 within 48 hours Increase SCr to > 1.5 times baseline Urine volume < 0.5 ml/kg/h for 6 hrs  Stage: Risk: 1.5x increase in creatinine or GFR decrease by 25% or UOP  <0.175ml/kgperhr for 6 hrs Injury: 2x increase in creatinine or GFR decrease by 50% or UOP < 0.975ml/kgperhr for 12 hr Failure:3X increase in creatinine or GFR decrease by 75% or UOP < 0.653ml/kgperhr for 12 hr or  anuria 12 hrs Loss: complete loss of kidney function for more then 4 weeks End-stage renal disease:Complete loss of kidney function for more then 3 months     A:  Worsenig=ng acute on chronic renal insuff P:   Monitor Per primary team  GASTROINTESTINAL A:  SBO - ? Due to opioids following Rt Hip Fracture P:   Dc morpphine NPO DC po meds NG to LIS  HEMATOLOGIC A:  Anemia of critical illness  P:  PRBC for hgb < 7gm%  INFECTIOUS BCx2  UC  Sputum   A:  HCAP  P:    Anti-infectives    Start     Dose/Rate Route Frequency Ordered Stop   07/02/14 1400  piperacillin-tazobactam (ZOSYN) IVPB 3.375 g    Comments:  Pharmacy dose for renal fxn   3.375 g 12.5 mL/hr over 240 Minutes Intravenous 3 times per day 07/02/14 0655     07/02/14 0900  vancomycin (VANCOCIN) IVPB 750 mg/150 ml premix     750 mg 150 mL/hr over 60 Minutes Intravenous Every 24 hours 07/02/14 0751     07/02/14 0700  piperacillin-tazobactam (ZOSYN) IVPB 3.375 g     3.375 g 100 mL/hr over 30 Minutes Intravenous  Once 07/02/14 0657 07/02/14 0819       ENDOCRINE A:  AT risk for hyperglycemia  P:   ssi per [facility]  NEUROLOGIC A:  Intermittent delirium. Seems resolved 07/02/14 P:   RASS goal: 0 Precedex gtt if he gets   FAMILY  - Updates: Patient expressing desire to go home. Goals confirmed with wife. D/w Dr Greig RightLampkin: [place] goals help. He will discuss home hospice possibility with them. I think he can survive transfer on high flow o2. Allow for cat to visit him. Can go to floor; palliative floor. PCCM will sign off        Dr. Kalman ShanMurali Arion Shankles, M.D., [facility]P Pulmonary and Critical  Care Medicine Staff Physician [facility] [place] Pulmonary and Critical Care Pager: [phone], If no answer or between  15:00h - 7:00h: call [phone]  07/02/2014 12:00 PM

## 2014-07-02 NOTE — Consult Note (Signed)
Patient Nathan [name]      DOB: [birth year]      WFU:[id number]     Consult Note from the Palliative Medicine Team at [facility] Requested by: Dr Doyle Askew     PCP: Irven Shelling, MD Reason for Consultation: [place]     Phone [phone]  Assessment/Recommendations: 74 yo male with multiple medical problems admitted with R hip fracture, hospital course complicated by worsening hypoxic resp failure in setting of CHF, COPD, new PE.  Worsening renal function and ileus.    1.  Code Status: DNR after my discussion with son and wife  2. GOC: Met with wife Juliann Pulse today and talked to son Annia Belt over phone several times today.  They are both in agreement that things have not gone well and that Kreg has really been struggling even prior to this hospitalization.  They feel like he would never do well in nursing home care, which even if he improved dramatically, he would not be able to do.  After talking with them both extensively the following conclusions were reached:  No escalation of care. Likely transition to comfort care this evening when family arrives or tomorrow morning.  Family agrees that if further decline we would not put on ventilator.  They agree that if he can not tolerate bipap and care becomes too burdensome, we should switch to comfort even if that means taking BiPAP off before family arrives this evening from out of town. Dr Romilda Garret talked with patient/wife this afternoon and they indicated a hope to get him home with hospice care.  Limiting factor here will be his ability to come off BiPAP. At times, he has had respiratory rate in 40's while on BiPAP and declining trajeotry.  I think this is ultimately a goal we can shoot for though. Son is supposed to land in [place] at [place] and come directly to hospital from here. If we can transition off BiPAP, home with hospice would be possible if stable. I think we would likely need to pursue continuous care at home  through hospice, which I do not believe hospice of [facility] provides (or at minimum extremely rare).  It will be difficult for any hospice company to set this up today, and I think we should wait until at least son gets here before pursuing.  Ultimately, from my experience, this would be too difficult a case for [facility] to handle and we will need to explore a different a hospice agency.  I will [place] early tomorrow morning and try to pool together resources to make this happen.    3. Symptom Management:   1. Dyspnea- I will increase fentanyl to 25-60mg q1h PRN pain/dyspnea.  As we transition off BiPAP may need to consider continuous infusion based on PRN needs.  I am available by phone even overnight if questions arise.    4. Psychosocial/Spiritual: KJuliann Pulseis 2nd wife.  1 son and there is question of another daughter whom he is estranged from. Not a spiritual person and family declines chaplain consult.    Brief HPI: 74yo male with PMHx of CAD, COPD, tobacco abuse ongoing, systolic HF, PVD, CKD.  Recent admit few weeks ago with COPD exacerbation. He presented to ED on 2/12 with fall and subsequently found to have right hip fracture.  On 2/14, he reportedly developed increased resp distress. V/Q scan concerning for PE and started on anticoag.  Also diuresed.  2/17 afib with rvr reported and cardiology  following for management.  2/20 he had worsening delirium and imaging concerns for SBO.  This morning more lethargic and increase hypoxia, dyspnea, work of breathing.  He has not responded to lasix and placed on BiPAP.  Has had several liters of fluid diuresed over the course of this week.  Increased pro-calcitonin level so zosyn started today as well.  Renal failure worsening today as well.  Given declining trajectory, palliative care consulted.   In speaking with Refoel this morning, he can not verbalize anything with me 2/2 bipap and his increased work of breathing. He shakes his hand  in a so-so manner when I ask if he understands what is going on.  When i talk about issues he frequently interrupts to indicate that he wants some water. He doesn't seem to understand fears related to taking BiPAP off for drinking or perhaps it doesn't matter to him at this point. Ultimately very difficult to predict how much he understands of his situation at this point. Reportedly he told nursing today that he did not want to go on ventilator, but I am unable to tell if he really understands his situation.     PMH:  Past Medical History  Diagnosis Date  . Aorto-iliac disease     a. s/p bilat with stent 2007 - followed by [facility].  . CAD (coronary artery disease)     a. 10/2011 s/p CABG x4 (LIMA-LAD, SVG-Diag, SVG-OM1 and SVG-PDA)  b. 03/2012 NSTEMI s/p LHC SVG-RCA and LIMA-LAD but total occlusion of SVG-OM as well as SVG-diag. RX therapy recommended   . Hypertension   . ED (erectile dysfunction)   . COPD, severe   . Hyperlipidemia   . Stroke   . GERD (gastroesophageal reflux disease)   . Gout   . Paroxysmal atrial flutter     ablation  . Chronic combined systolic and diastolic CHF (congestive heart failure)     a. Last echo 04/2014: EF 40-45%.  Marland Kitchen PAF (paroxysmal atrial fibrillation)   . CKD (chronic kidney disease), stage III   . Tobacco abuse   . History of noncompliance with medical treatment   . Carotid artery disease     a. RCEA 2013 - followed by [facility].  . Anemia   . Lower GI bleed     a. LGIB 10/2012: Colonoscopy performed June 21 showed blood in the colon but no definite source identified, diverticular versus AVM suspected. Aspirin stopped.  . Mitral regurgitation   . Hypertensive heart disease      PSH: Past Surgical History  Procedure Laterality Date  . Spinal fusion    . Hand surgery    . Neck fusion  1975  . Eye surgery    . Coronary artery bypass graft  10/31/2011    Procedure: CORONARY ARTERY BYPASS GRAFTING (CABG);  Surgeon: Grace Isaac, MD;  Location: [facility];   Service: Open Heart Surgery;  Laterality: N/A;  times three using  Left Internal Mammary Artery and Right Greater Saphenous Vein Graft harvested endoscopically; TEE  . Endarterectomy  10/31/2011    Procedure: ENDARTERECTOMY CAROTID;  Surgeon: Serafina Mitchell, MD;  Location[facility] OR;  Service: Vascular;  Laterality: Right;  with patch angioplasty   . Carotid endarterectomy  10-31-2011    right  . Colonoscopy N/A 10/30/2012    Procedure: COLONOSCOPY;  Surgeon: Lear Ng, MD;  Location: [facility] ENDOSCOPY;  Service: Endoscopy;  Laterality: N/A;  . Left heart catheterization with coronary angiogram N/A 09/04/2011    Procedure: LEFT  HEART CATHETERIZATION WITH CORONARY ANGIOGRAM;  Surgeon: Sinclair Grooms, MD;  Location: [facility] CATH LAB;  Service: Cardiovascular;  Laterality: N/A;  . Left heart cath N/A 04/03/2012    Procedure: LEFT HEART CATH;  Surgeon: Troy Sine, MD;  Location: [facility] CATH LAB;  Service: Cardiovascular;  Laterality: N/A;   I have reviewed the FH and SH and  If appropriate update it with new information. No Known Allergies Scheduled Meds: . aspirin  81 mg Oral Daily  . ipratropium  0.5 mg Nebulization Q6H  . levalbuterol  1.25 mg Nebulization 4 times per day  . nicotine  21 mg Transdermal Daily  . pantoprazole (PROTONIX) IV  40 mg Intravenous Q24H  . piperacillin-tazobactam (ZOSYN)  IV  3.375 g Intravenous 3 times per day  . potassium chloride  20 mEq Oral BID  . sodium chloride  3 mL Intravenous Q12H  . vancomycin  750 mg Intravenous Q24H   Continuous Infusions: . dexmedetomidine     PRN Meds:.sodium chloride, fentaNYL, ipratropium, levalbuterol, metoprolol, sodium chloride    BP 133/69 mmHg  Pulse 112  Temp(Src) 97.6 F (36.4 C) (Axillary)  Resp 31  Ht '6\' 1"'  (1.854 m)  Wt 77 kg (169 lb 12.1 oz)  BMI 22.40 kg/m2  SpO2 97%   PPS: 30   Intake/Output Summary (Last 24 hours) at 07/02/14 0952 Last data filed at 07/02/14 7989  Gross per 24 hour  Intake    175 ml   Output   1760 ml  Net  -1585 ml    Physical Exam:  General: Alert, on BiPAP, WOB prevents him from talking HEENT:  [place], sclera anicteric, dry mm Neck: supple Chest:   Decreased breath sounds CVS: tachy Abdomen: distended Ext: edematous Neuro: alert, able to follow commands Psych: difficulty demonstrating insight which is complicated by severely increased WOB and resp distress Skin: warm, scattered ecchymosis  Labs: CBC    Component Value Date/Time   WBC 6.7 07/02/2014 0410   RBC 4.17* 07/02/2014 0410   HGB 10.7* 07/02/2014 0410   HCT 34.1* 07/02/2014 0410   PLT 127* 07/02/2014 0410   MCV 81.8 07/02/2014 0410   MCH 25.7* 07/02/2014 0410   MCHC 31.4 07/02/2014 0410   RDW 17.0* 07/02/2014 0410   LYMPHSABS 0.5* 06/26/2014 0357   MONOABS 0.7 06/26/2014 0357   EOSABS 0.0 06/26/2014 0357   BASOSABS 0.0 06/26/2014 0357    BMET    Component Value Date/Time   NA 139 07/02/2014 0410   K 4.7 07/02/2014 0410   CL 100 07/02/2014 0410   CO2 27 07/02/2014 0410   GLUCOSE 110* 07/02/2014 0410   BUN 119* 07/02/2014 0410   CREATININE 2.94* 07/02/2014 0410   CREATININE 1.58* 09/18/2011 1224   CALCIUM 8.6 07/02/2014 0410   GFRNONAA 19* 07/02/2014 0410   GFRAA 23* 07/02/2014 0410    CMP     Component Value Date/Time   NA 139 07/02/2014 0410   K 4.7 07/02/2014 0410   CL 100 07/02/2014 0410   CO2 27 07/02/2014 0410   GLUCOSE 110* 07/02/2014 0410   BUN 119* 07/02/2014 0410   CREATININE 2.94* 07/02/2014 0410   CREATININE 1.58* 09/18/2011 1224   CALCIUM 8.6 07/02/2014 0410   PROT 5.5* 06/23/2014 1413   ALBUMIN 3.0* 06/23/2014 1413   AST 28 06/23/2014 1413   ALT 76* 06/23/2014 1413   ALKPHOS 53 06/23/2014 1413   BILITOT 0.7 06/23/2014 1413   GFRNONAA 19* 07/02/2014 0410   GFRAA 23* 07/02/2014 0410  2/12 CT Head IMPRESSION: Atrophy and microvascular ischemic disease without acute intracranial process.  2/12 MRI Right Hip IMPRESSION: 1. Nondisplaced RIGHT obturator  ring fractures. Complementary nondisplaced RIGHT sacral ala fracture. 2. Bilateral hip girdle muscular strain. 3. Anasarca up. 4. LEFT inguinal hernia containing a loop of sigmoid colon.  2/13 CXR IMPRESSION: Decreased small bilateral pleural effusions and right basilar atelectasis.  Stable cardiomegaly.   2/15 V/Q IMPRESSION: Intermediate probability for pulmonary embolism by PIOPED II. Bibasilar airspace disease and pleural effusions significantly limits the utility of V/Q scan.  2/20 CXR/ABD IMPRESSION: 1. Marked colonic distention which may reflect distal bowel obstruction or colonic ileus. No free air noted. 2. Congestive heart failure and bilateral airspace opacities.  2/21 CXR IMPRESSION: Nasogastric tube tip in the stomach, with side-port in the EG junction. This should be advanced for optimal placement.  Central and basilar airspace opacities without significant interval change. There also are unchanged pleural effusions.   Total Time: 80 minutes Greater than 50%  of this time was spent counseling and coordinating care related to the above assessment and plan.   Doran Clay D.O. Palliative Medicine Team at [facility]  Pager: [phone] Team Phone: [phone]

## 2014-07-02 NOTE — Progress Notes (Signed)
Patient Name: Nathan [name] Date of Encounter: 07/02/2014     Principal Problem:   Acute respiratory distress Active Problems:   COPD (chronic obstructive pulmonary disease), gold stage C.   Hyperlipidemia   S/P CABG (coronary artery bypass graft)   Chronic kidney disease (CKD), stage III (moderate)   Tobacco use disorder   PVD (peripheral vascular disease)   Essential hypertension   PAF (paroxysmal atrial fibrillation)   CAD (coronary artery disease)   Chronic combined systolic and diastolic CHF (congestive heart failure)   Anemia   Hypertensive heart disease   Right hip pain   Fall   Ear lobe laceration   Hip pain, acute   Acute hip pain   Acute on chronic systolic CHF (congestive heart failure)   Dislocation of hip, obturator, right, closed   Sacral fracture, closed   Pulmonary embolus: Probable    SUBJECTIVE  The patient was moved to stepdown yesterday because of deteriorating clinical status. This morning he is alert.  He indicates no chest pain.  He does indicate abdominal discomfort and distention.  Heart rhythm is atrial fibrillation with ventricular response 100/beats per minute.  Blood pressure is satisfactory. The patient is now nothing by mouth.  Will use low-dose metoprolol intravenously as needed for rate control.  CURRENT MEDS . aspirin  81 mg Oral Daily  . ipratropium  0.5 mg Nebulization Q6H  . levalbuterol  1.25 mg Nebulization 4 times per day  . nicotine  21 mg Transdermal Daily  . pantoprazole (PROTONIX) IV  40 mg Intravenous Q24H  . piperacillin-tazobactam (ZOSYN)  IV  3.375 g Intravenous 3 times per day  . potassium chloride  20 mEq Oral BID  . sodium chloride  3 mL Intravenous Q12H  . vancomycin  750 mg Intravenous Q24H    OBJECTIVE  Filed Vitals:   07/02/14 0600 07/02/14 0700 07/02/14 0800 07/02/14 0818  BP: 129/61 113/63 133/69   Pulse: 106 99 101   Temp:   97.6 F (36.4 C)   TempSrc:   Axillary   Resp: 29 31 32   Height:        Weight:  169 lb 12.1 oz (77 kg)    SpO2: 99% 99% 99% 100%    Intake/Output Summary (Last 24 hours) at 07/02/14 0837 Last data filed at 07/02/14 0814  Gross per 24 hour  Intake    175 ml  Output   1760 ml  Net  -1585 ml   Filed Weights   07/01/14 0500 07/01/14 1353 07/02/14 0700  Weight: 173 lb 1 oz (78.5 kg) 174 lb 2.6 oz (79 kg) 169 lb 12.1 oz (77 kg)    PHYSICAL EXAM  General: Pleasant, NAD. Neuro: Alert  Psych: Not tested HEENT:  Normal  Neck: Supple without bruits or JVD. Lungs: Diffuse rhonchi and wheezes Heart: Irregular pulse.  No gallop or murmur. Abdomen: Mildly distended.  Moderate tenderness.  Decreased bowel sounds. Extremities: The right leg is foreshortened and externally rotated.  2+ pedal edema bilaterally.  Accessory Clinical Findings  CBC  Recent Labs  07/01/14 0545 07/02/14 0410  WBC 9.5 6.7  HGB 10.9* 10.7*  HCT 34.1* 34.1*  MCV 80.8 81.8  PLT 123* 127*   Basic Metabolic Panel  Recent Labs  07/01/14 0545 07/02/14 0410  NA 134* 139  K 4.8 4.7  CL 96 100  CO2 26 27  GLUCOSE 125* 110*  BUN 96* 119*  CREATININE 2.63* 2.94*  CALCIUM 8.3* 8.6  MG  --  2.9*  PHOS  --  6.0*   Liver Function Tests No results for input(s): AST, ALT, ALKPHOS, BILITOT, PROT, ALBUMIN in the last 72 hours. No results for input(s): LIPASE, AMYLASE in the last 72 hours. Cardiac Enzymes No results for input(s): CKTOTAL, CKMB, CKMBINDEX, TROPONINI in the last 72 hours. BNP Invalid input(s): POCBNP D-Dimer No results for input(s): DDIMER in the last 72 hours. Hemoglobin A1C No results for input(s): HGBA1C in the last 72 hours. Fasting Lipid Panel No results for input(s): CHOL, HDL, LDLCALC, TRIG, CHOLHDL, LDLDIRECT in the last 72 hours. Thyroid Function Tests No results for input(s): TSH, T4TOTAL, T3FREE, THYROIDAB in the last 72 hours.  Invalid input(s): FREET3  TELE  Atrial fibrillation with ventricular response 100/m    Radiology/Studies  Dg  Chest 2 View  06/14/2014   CLINICAL DATA:  Worsening shortness of breath  EXAM: CHEST  2 VIEW  COMPARISON:  06/06/2014  FINDINGS: Bilateral pleural effusions, moderate on the right and small on the left. The left effusion has slightly increased from comparison study, with loculation along the lateral and posterior chest wall. The underlying lung is obscured at the right base. There are pleural calcifications at the bilateral apex, but none definitely seen at the bases.  Stable heart size and aortic contours post CABG.  No pulmonary edema.  No pneumothorax.  IMPRESSION: Moderate right and small left pleural effusions. The partially loculated left pleural effusion has mildly increased since 06/06/2014.   Electronically Signed   By: Tiburcio Pea M.D.   On: 06/14/2014 18:29   Dg Chest 2 View (if Patient Has Fever And/or Copd)  06/06/2014   CLINICAL DATA:  Chest pain and shortness of breath  EXAM: CHEST  2 VIEW  COMPARISON:  04/23/2014  FINDINGS: Cardiac shadow is stable but mildly enlarged. Postsurgical changes are again seen. Bilateral pleural effusions are noted which appears stable when compare with the prior study. Likely underlying atelectasis is present. No new focal abnormality is seen.  IMPRESSION: Stable bilateral pleural effusions. No new focal abnormality is noted.   Electronically Signed   By: Alcide Clever M.D.   On: 06/06/2014 19:40   Ct Head Wo Contrast  06/23/2014   CLINICAL DATA:  Larey Seat about 8 hr ago now with injury to the right side of the head and ear.  EXAM: CT HEAD WITHOUT CONTRAST  TECHNIQUE: Contiguous axial images were obtained from the base of the skull through the vertex without intravenous contrast.  COMPARISON:  09/19/2011; 09/11/2011  FINDINGS: Similar findings of atrophy with diffuse sulcal prominence, centralized volume loss and mild commensurate expected dilatation of the ventricular system. Scattered periventricular hypodensities are unchanged compatible with microvascular ischemic  disease. The gray-white differentiation is otherwise well maintained without CT evidence of acute large territory infarct. No intraparenchymal or extra-axial mass or hemorrhage. Unchanged size and configuration of the ventricles and basilar cisterns. No midline shift. Intracranial atherosclerosis. Limited visualization of the paranasal sinuses and mastoid air cells is normal. No air-fluid levels. Regional soft tissues appear normal. Post left-sided cataract surgery.  IMPRESSION: Atrophy and microvascular ischemic disease without acute intracranial process.   Electronically Signed   By: Simonne Come M.D.   On: 06/23/2014 15:01   Ct Chest Wo Contrast  06/14/2014   CLINICAL DATA:  74 year old male with progressive shortness of breath over the past month and bilateral pleural effusions  EXAM: CT CHEST WITHOUT CONTRAST  TECHNIQUE: Multidetector CT imaging of the chest was performed following the standard protocol without IV contrast.  COMPARISON:  Prior chest x-ray 06/14/2014  FINDINGS: Mediastinum: Unremarkable thyroid gland. Mediastinal lymph nodes remain within normal limits for size. High right paratracheal node measures 9 mm in short axis on image 17 of series 2. Unremarkable thoracic esophagus.  Heart/Vascular: Limited evaluation in the absence of intravenous contrast. Conventional 3 vessel arch anatomy. Scattered atherosclerotic calcifications including coronary artery disease. Evidence of prior multivessel CABG. The heart is enlarged. No pericardial effusion.  Lungs/Pleura: Focal calcified pleural plaques in the posteromedial aspect of the left lower thorax and at the right lung apex. Small and partially loculated left pleural effusion with pleural fluid extending through the fissure. Moderate right pleural effusion. Associated dependent atelectasis in the right greater than left lower lobes. No pulmonary edema, focal airspace consolidation or suspicious pulmonary nodule. Diffuse mild lower lobe bronchial wall  thickening.  Bones/Soft Tissues: Chronic nonunion of the median sternotomy. The cerclage wires remain intact. No acute fracture or aggressive appearing lytic or blastic osseous lesion.  Upper Abdomen: Visualized upper abdominal organs are unremarkable.  IMPRESSION: 1. Moderate right and small low but loculated left pleural effusions. No definitive etiology for the pleural fluid is identified by CT imaging. Given the partial loculation on the left, chronic effusions perhaps related to prior episodes of congestive heart failure is favored. 2. Associated chronic right greater than left lower lobe atelectasis. 3. 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 This may predispose to chronic pleural effusion formation. 4. Atherosclerosis including coronary artery disease. 5. Chronic nonunion of the median sternotomy. Cerclage wires remain intact. 6. Diffuse mild lower lobe bronchial wall thickening.   Electronically Signed   By: Malachy Moan M.D.   On: 06/14/2014 19:34   Mr Hip Right Wo Contrast  06/24/2014   CLINICAL DATA:  RIGHT hip pain.  Recent fall.  Occult fracture.  EXAM: MR OF THE RIGHT HIP WITHOUT CONTRAST  TECHNIQUE: Multiplanar, multisequence MR imaging was performed. No intravenous contrast was administered.  COMPARISON:  Radiographs 06/23/2014.  FINDINGS: Nondisplaced RIGHT obturator ring fractures are present. Fracture in the mid RIGHT inferior pubic ramus. There is also a fracture of the root of the RIGHT superior pubic ramus. Additionally, there is a complementary RIGHT sacral ala fracture. All of these fractures are nondisplaced and or radiographically occult. Anasarca is present with diffuse subcutaneous edema. LEFT gluteal muscular edema compatible with strain. There is also bilateral RIGHT-greater-than-LEFT adductor compartment strain and reactive edema. Gluteal tendons appear intact. Edema is present in the RIGHT obturator internus and externus  consistent with strain and reactive edema from adjacent fractures.  There is no proximal femur fracture. No hip effusion. Negative for bursitis. LEFT inguinal hernia contains a portion of the sigmoid colon.  IMPRESSION: 1. Nondisplaced RIGHT obturator ring fractures. Complementary nondisplaced RIGHT sacral ala fracture. 2. Bilateral hip girdle muscular strain. 3. Anasarca up. 4. LEFT inguinal hernia containing a loop of sigmoid colon.   Electronically Signed   By: Andreas Newport M.D.   On: 06/24/2014 08:26   Nm Pulmonary Perf And Vent  06/26/2014   CLINICAL DATA:  Respiratory distress  EXAM: NUCLEAR MEDICINE VENTILATION - PERFUSION LUNG SCAN  TECHNIQUE: Ventilation images were obtained in multiple projections using inhaled aerosol technetium 99 M DTPA. Perfusion images were obtained in multiple projections after intravenous injection of Tc-58m MAA.  RADIOPHARMACEUTICALS:  Fifty mCi Tc-23m DTPA aerosol and 5.5 mCi Tc-25m MAA  COMPARISON:  Chest x-ray from the same day.  FINDINGS: There are 2 peripheral and wedge-shaped  defects in the right lower lobe, 1 large and the other moderate, located in the posterior and apical segments respectively. The largest is triple matched based on chest x-ray from today; the ventilation defect is larger in the apical defect. Poor ventilation in the left lower lobe, with better perfusion, correlating with pleural fluid and airspace disease on preceding chest x-ray. There could be a small mismatched defect in the lingula based on left lateral imaging. The lung contours are altered in the setting of bilateral pleural effusions.  IMPRESSION: Intermediate probability for pulmonary embolism by PIOPED II. Bibasilar airspace disease and pleural effusions significantly limits the utility of V/Q scan.   Electronically Signed   By: Marnee Spring M.D.   On: 06/26/2014 12:00   Dg Chest Port 1 View  07/02/2014   CLINICAL DATA:  Dyspnea  EXAM: PORTABLE CHEST - 1 VIEW  COMPARISON:  07/01/2014   FINDINGS: There is a nasogastric tube with tip in the stomach but side-port at the EG junction.  There is moderate cardiomegaly, unchanged. There are airspace opacities in the central and basilar regions bilaterally. There are a effusions, right greater than left.  IMPRESSION: Nasogastric tube tip in the stomach, with side-port in the EG junction. This should be advanced for optimal placement.  Central and basilar airspace opacities without significant interval change. There also are unchanged pleural effusions.   Electronically Signed   By: Ellery Plunk M.D.   On: 07/02/2014 04:27   Dg Chest Port 1 View  06/26/2014   CLINICAL DATA:  Dyspnea  EXAM: PORTABLE CHEST - 1 VIEW  COMPARISON:  06/25/2014  FINDINGS: There is unchanged cardiomegaly. There is improvement, with less confluent consolidation in the lateral left lung although ground-glass opacities persist bilaterally. There is no change in the moderate right pleural effusion.  IMPRESSION: Slight improvement in the left lung consolidation. Persistent edema and right pleural effusion.   Electronically Signed   By: Ellery Plunk M.D.   On: 06/26/2014 06:14   Dg Chest Port 1 View  06/25/2014   CLINICAL DATA:  Patient with shortness of breath. History of coronary artery disease and hypertension.  EXAM: PORTABLE CHEST - 1 VIEW  COMPARISON:  06/24/2014  FINDINGS: Multiple monitoring leads overlie the patient. The left costophrenic angle is excluded from view. Stable enlarged cardiac and mediastinal contours status post median sternotomy and CABG procedure. Bilateral mid and lower lung heterogeneous pulmonary opacities, left greater than right. Small to moderate right pleural effusion. Probable layering left pleural effusion, although the left lung base is excluded from view. No definite pneumothorax.  IMPRESSION: Bilateral mid to lower lung heterogeneous opacities, left greater than right, with associated moderate right and probable small left pleural  effusions. Findings may represent pulmonary edema, infection or underlying atelectasis.   Electronically Signed   By: Annia Belt M.D.   On: 06/25/2014 12:20   Dg Chest Port 1 View  06/24/2014   CLINICAL DATA:  Congestive heart failure. Coronary artery disease. COPD. Hypertension. Atrial flutter and fibrillation.  EXAM: PORTABLE CHEST - 1 VIEW  COMPARISON:  06/14/2014  FINDINGS: Moderate cardiomegaly is stable. Small pleural effusions are seen bilaterally which has decreased in size since previous study. Decreased atelectasis also noted in the right lung base. Prior CABG again noted.  IMPRESSION: Decreased small bilateral pleural effusions and right basilar atelectasis.  Stable cardiomegaly.   Electronically Signed   By: Myles Rosenthal M.D.   On: 06/24/2014 17:08   Dg Abd Acute W/chest  07/01/2014  CLINICAL DATA:  Dyspnea. CHF Coronary artery disease, hypertension and COPD.  EXAM: ACUTE ABDOMEN SERIES (ABDOMEN 2 VIEW & CHEST 1 VIEW)  COMPARISON:  06/26/2014  FINDINGS: The heart size is enlarged. There are bilateral pleural effusions noted right greater than left. Bilateral airspace opacities are noted which appear unchanged from previous exam. Marked colonic distention is identified. There is gas identified throughout the colon up to the level of the rectum. No free intraperitoneal air are identified.  IMPRESSION: 1. Marked colonic distention which may reflect distal bowel obstruction or colonic ileus. No free air noted. 2. Congestive heart failure and bilateral airspace opacities.   Electronically Signed   By: Signa Kell M.D.   On: 07/01/2014 10:30   Dg Hip Unilat  With Pelvis 2-3 Views Right  06/23/2014   CLINICAL DATA:  Status post fall today while getting out of bed; right hip pain  EXAM: RIGHT HIP (WITH PELVIS) 2-3 VIEWS  COMPARISON:  Supine abdominal film of January 04, 2014 which includes a portion of the hips  FINDINGS: The bony pelvis is osteopenic. No acute pelvic fracture is demonstrated. The  hip joint spaces are preserved. The observed portions of the sacrum are normal. There are common iliac stents bilaterally.  AP and frog-leg lateral views of the right hip reveal the joint space to be normal. The acetabulum is intact. The femoral head, neck, and intertrochanteric regions are normal in appearance.  IMPRESSION: There is no acute bony abnormality of the right hip.   Electronically Signed   By: David  [place]   On: 06/23/2014 14:05    ASSESSMENT AND PLAN  1. Acute respiratory failure: Related to his severe underlying COPD and pulmonary embolus. Continue supportive care. He is very short of breath of breath. Increased work of breathing. This seems to be more related to his underlying COPD and the acute PE than cardiac issues. His HR is well controlled.  He was still smoking when he came to the hosptial.   2. pulmonary embolus: He is on coumadin .  3. Atrial fibrillation: Has new onset atrial fib. This is likely related to his severe COPD and the pulmonary embolus. His HR is well controlled.  Echo reveals mild LV dysfunction with EF of 40%. While he is nothing by mouth we will use low dose metoprolol for rate control as necessary.  4. CAD : His last cath was in Nov. 2013 that shows: patent LIMA to LAD, SVG -RCA. The SVG - OM and the SVG - Diag were occluded. His respiratory distress over the past several days is not consistent with an acute coronary syndrome  5. CHK - stage III - plans per Int. Med.   6. Acute on chronic systolic CHF : He does not appear to be fluid overloaded at this point.  His peripheral edema appears to be chronic.  Lasix on hold  7. Abdominal pain and distention: Plans per internal medicine.  Impression: Overall doing poorly with multisystem disease.  May wish to involve palliative care in his management.  Signed, Cassell Clement MD

## 2014-07-02 NOTE — Progress Notes (Signed)
Patient is currently on NRB 15L and sats are 99%. BIPAP not needed at this time.

## 2014-07-02 NOTE — Progress Notes (Signed)
eLink Physician-Brief Progress Note Patient Name: Nathan KellMaurice W [name] DOB: 03/19/1941 MRN: [id number]   Date of Service  07/02/2014  HPI/Events of Note  Procal 23 this am with persistent bilat infiltrates  eICU Interventions  Blood cultures Add abx     Intervention Category Major Interventions: Infection - evaluation and management  Henry RusselSMITH, Lendon George, P 07/02/2014, 6:47 AM

## 2014-07-02 NOTE — Progress Notes (Signed)
eLink Physician-Brief Progress Note Patient Name: Nathan KellMaurice W Pusey DOB: 07/11/1940 MRN: [id number]   Date of Service  07/02/2014  HPI/Events of Note  74 yr old male with cardiomyopathy, COPD, and CRF with increasing work of breathing.  Given lasix earlier this evening with minimal response.   eICU Interventions  Bipap cxr now Will consider additional lasix based on cxr appearance     Intervention Category Major Interventions: Hypoxemia - evaluation and management;Respiratory failure - evaluation and management  Henry RusselSMITH, Wadsworth Skolnick, P 07/02/2014, 3:28 AM

## 2014-07-03 ENCOUNTER — Inpatient Hospital Stay (HOSPITAL_COMMUNITY): Payer: Medicare PPO

## 2014-07-03 DIAGNOSIS — R410 Disorientation, unspecified: Secondary | ICD-10-CM

## 2014-07-03 DIAGNOSIS — E43 Unspecified severe protein-calorie malnutrition: Secondary | ICD-10-CM | POA: Insufficient documentation

## 2014-07-03 LAB — CBC
HEMATOCRIT: 36.2 % — AB (ref 39.0–52.0)
HEMOGLOBIN: 10.9 g/dL — AB (ref 13.0–17.0)
MCH: 25.1 pg — AB (ref 26.0–34.0)
MCHC: 30.1 g/dL (ref 30.0–36.0)
MCV: 83.4 fL (ref 78.0–100.0)
PLATELETS: 128 10*3/uL — AB (ref 150–400)
RBC: 4.34 MIL/uL (ref 4.22–5.81)
RDW: 16.9 % — ABNORMAL HIGH (ref 11.5–15.5)
WBC: 5 10*3/uL (ref 4.0–10.5)

## 2014-07-03 LAB — PHOSPHORUS: Phosphorus: 6.5 mg/dL — ABNORMAL HIGH (ref 2.3–4.6)

## 2014-07-03 LAB — PROCALCITONIN: Procalcitonin: 16.33 ng/mL

## 2014-07-03 LAB — BASIC METABOLIC PANEL
Anion gap: 10 (ref 5–15)
BUN: 112 mg/dL — ABNORMAL HIGH (ref 6–23)
CO2: 32 mmol/L (ref 19–32)
Calcium: 8.3 mg/dL — ABNORMAL LOW (ref 8.4–10.5)
Chloride: 106 mmol/L (ref 96–112)
Creatinine, Ser: 2.43 mg/dL — ABNORMAL HIGH (ref 0.50–1.35)
GFR calc Af Amer: 28 mL/min — ABNORMAL LOW (ref >90)
GFR, EST NON AFRICAN AMERICAN: 24 mL/min — AB (ref >90)
Glucose, Bld: 106 mg/dL — ABNORMAL HIGH (ref 70–99)
POTASSIUM: 4.2 mmol/L (ref 3.5–5.1)
Sodium: 148 mmol/L — ABNORMAL HIGH (ref 135–145)

## 2014-07-03 LAB — PROTIME-INR
INR: 4.86 — AB (ref 0.00–1.49)
Prothrombin Time: 45.7 seconds — ABNORMAL HIGH (ref 11.6–15.2)

## 2014-07-03 LAB — MAGNESIUM: Magnesium: 3.1 mg/dL — ABNORMAL HIGH (ref 1.5–2.5)

## 2014-07-03 MED ORDER — SENNOSIDES 8.8 MG/5ML PO SYRP
5.0000 mL | ORAL_SOLUTION | Freq: Two times a day (BID) | ORAL | Status: DC
  Administered 2014-07-03 – 2014-07-10 (×8): 5 mL via ORAL
  Filled 2014-07-03 (×15): qty 5

## 2014-07-03 MED ORDER — FENTANYL CITRATE 0.05 MG/ML IJ SOLN
25.0000 ug | INTRAMUSCULAR | Status: DC | PRN
  Administered 2014-07-04 (×2): 50 ug via INTRAVENOUS
  Administered 2014-07-05: 25 ug via INTRAVENOUS
  Administered 2014-07-06 – 2014-07-10 (×21): 50 ug via INTRAVENOUS
  Filled 2014-07-03 (×25): qty 2

## 2014-07-03 MED ORDER — FUROSEMIDE 10 MG/ML IJ SOLN
20.0000 mg | Freq: Two times a day (BID) | INTRAMUSCULAR | Status: DC
  Administered 2014-07-03 – 2014-07-04 (×2): 20 mg via INTRAVENOUS
  Filled 2014-07-03 (×4): qty 2

## 2014-07-03 NOTE — Progress Notes (Signed)
Patient ZO:XWRUEAV CLINE DRAHEIM      DOB: 05-20-1940      WUJ:[id number]   Palliative Medicine Team at [facility] Progress Note    Subjective: Intermittent confusion this morning. Denies pain, SOB. Asks where he is. Talks about wanting to go home and questions whether he is dying.    Filed Vitals:   07/03/14 0445  BP: 126/82  Pulse: 98  Temp: 97.8 F (36.6 C)  Resp: 20   Physical exam: GEN: alert, NAD on NRB HEENT: [place], sclera anicteric CV: tachy LUNGS: Coarse throughout ABD: distended EXT: edema  CBC    Component Value Date/Time   WBC 5.0 07/03/2014 0411   RBC 4.34 07/03/2014 0411   HGB 10.9* 07/03/2014 0411   HCT 36.2* 07/03/2014 0411   PLT 128* 07/03/2014 0411   MCV 83.4 07/03/2014 0411   MCH 25.1* 07/03/2014 0411   MCHC 30.1 07/03/2014 0411   RDW 16.9* 07/03/2014 0411   LYMPHSABS 0.5* 06/26/2014 0357   MONOABS 0.7 06/26/2014 0357   EOSABS 0.0 06/26/2014 0357   BASOSABS 0.0 06/26/2014 0357    CMP     Component Value Date/Time   NA 148* 07/03/2014 0411   K 4.2 07/03/2014 0411   CL 106 07/03/2014 0411   CO2 32 07/03/2014 0411   GLUCOSE 106* 07/03/2014 0411   BUN 112* 07/03/2014 0411   CREATININE 2.43* 07/03/2014 0411   CREATININE 1.58* 09/18/2011 1224   CALCIUM 8.3* 07/03/2014 0411   PROT 5.5* 06/23/2014 1413   ALBUMIN 3.0* 06/23/2014 1413   AST 28 06/23/2014 1413   ALT 76* 06/23/2014 1413   ALKPHOS 53 06/23/2014 1413   BILITOT 0.7 06/23/2014 1413   GFRNONAA 24* 07/03/2014 0411   GFRAA 28* 07/03/2014 0411      Assessment and plan: 74 yo male with multiple medical problems admitted with R hip fracture, hospital course complicated by worsening hypoxic resp failure in setting of CHF, COPD, new PE. Worsening renal function and ileus.   1. Code Status: DNR  2. GOC: See initial consultation report.  Yesterday, tentative talks were to try and get him home with hospice. In talking with Olegario Messier this morning, she works 16h per day and there is no way she  could handle this at home, even with hospice support.  I spoke with several hospice agencies this morning, and his O2 requirements present a significant challenge for them (they are not accustomed to doing, BiPAP, high flow-O2, etc in most cases). [facility] seems to be most advanced in these needs but also do not service where he lives. Ultimately, his breathing is doing much better this morning, and renal function has shown some improvement.  Son arrived from [place] last night and I talked with both him and Olegario Messier today.  They both feel he looks better than he has in days and acknowledge his intermittent confusion.  They are not sure he has full understnadnign of his situation> while he asked me about home and dying, he also couldn't remember where he was and at one point even asked me what his name was.  This was followed later in the morning with marked improvement in clarity.  I talked to family about medical issues he is facing and his status remaining tenuous despite improvement today. We also talked that if trajectory continues, he could continue improvement and survive this hospital stay.  Concerning thing will remain his previously declining functional status, multiple medical issues, and his ability to comply with long-term rehab.  I doubt he will get back to baseline in best case scenario. Family concerned about him being willing to participate in rehab. Ultimately, they would like to see how next 48 or so hours go to make decisions about long term care.  They will try to engage [employer]ice "Bill" as well when he has moments of clarity.  They appropriately would like to involve him as much as possible in decision making  3. Symptom Management:  1. Dyspnea- continue PRN fentanyl 2. Delirium- hopefully improves with treatment of medical issues.  Avoid benzos. Blinds open during day, etc.    4. Psychosocial/Spiritual: [name] is 2nd wife. 1 son and there is question of another daughter  whom he is estranged from. Not a spiritual person and family declines chaplain consult.   Total Time: 60 minutes >50 % of time spent in counseling and coordination of care regarding above. Discussed case with Dr Izola PriceMyers.  Orvis BrillAaron J. Larayne Baxley D.O. Palliative Medicine Team at [facility]  Pager: [phone] Team Phone: [phone]

## 2014-07-03 NOTE — Progress Notes (Signed)
CSW continuing to follow.   Pt initially planned to [facility] when medically stable, but disposition at this time is dependent on pt outcomes. Per Palliative Care MD note, plan is to see how pt progresses in the next 48 hours or so in order to further determine disposition plans.   CSW spoke with [facility] and facility stated that they received insurance authorization from [employer], but authorization will only be valid for 24 to 48 hours. Per [facility], facility would be unable to manage pt on non-rebreather mask or venturi mask. Pt will need to be able to participate with therapies in order to qualify for SNF.   CSW to continue to follow pt progress to assist with appropriate disposition planning.  Loletta SpecterSuzanna Kidd, MSW, LCSW Clinical Social Work [phone]

## 2014-07-03 NOTE — Plan of Care (Signed)
Problem: Phase I Progression Outcomes Goal: Dyspnea controlled at rest Outcome: Progressing SOB when speaking

## 2014-07-03 NOTE — Progress Notes (Signed)
INITIAL NUTRITION ASSESSMENT  DOCUMENTATION CODES Per approved criteria  -Severe malnutrition in the context of acute illness or injury  Pt meets criteria for severe MALNUTRITION in the context of acute illness as evidenced by po <50% of estimated needs for >5 days and wt loss of >5% in 1 month.  INTERVENTION: - Diet advancement as tolerated per MD. - Once diet upgraded, add Ensure Complete po BID, each supplement provides 350 kcal and 13 grams of protein - Once diet upgraded, add Magic cup TID with meals, each supplement provides 290 kcal and 9 grams of protein - RD will continue to monitor  NUTRITION DIAGNOSIS: Inadequate oral intake related to inability to eat as evidenced by NPO.   Goal: Pt to meet >/= 90% of their estimated nutrition needs   Monitor:  Weight trend, diet advancement, labs, po intake  Reason for Assessment: Low Braden Score  74 y.o. male  Admitting Dx: Acute respiratory distress  ASSESSMENT: 74 yo male with multiple medical problems admitted with R hip fracture, hospital course complicated by worsening hypoxic resp failure in setting of CHF, COPD, new PE. Worsening renal function and ileus.   - Pt reports that he is ready to eat. He feels hungry. Pt with no po x 6-7 days.  - Usual body weight is 183 lbs which he last weighed 10 days ago.  - Pt followed by palliative care with tentative talks to get him d/c'd with hospice.  - Pt currently NPO due to possible SBO.  - Labs Reviewed   Height: Ht Readings from Last 1 Encounters:  07/01/14  (1.854 m)    Weight: Wt Readings from Last 1 Encounters:  07/02/14 169 lb 12.1 oz (77 kg)    Ideal Body Weight: 79.9 kg  % Ideal Body Weight: 96%  Wt Readings from Last 10 Encounters:  07/02/14 169 lb 12.1 oz (77 kg)  06/23/14 186 lb (84.369 kg)  06/20/14 200 lb (90.719 kg)  06/18/14 187 lb 12.8 oz (85.186 kg)  06/06/14 183 lb (83.008 kg)  04/28/14 181 lb 3.2 oz (82.192 kg)  04/24/14 174 lb 6.4 oz  (79.107 kg)  01/05/14 175 lb 4.8 oz (79.516 kg)  09/23/13 183 lb (83.008 kg)  03/23/13 177 lb (80.287 kg)    Usual Body Weight: 183 lbs  % Usual Body Weight: 92%  BMI:  Body mass index is 22.4 kg/(m^2).  Estimated Nutritional Needs: Kcal: 2000-2200 Protein: 115-130 g Fluid: 2.0 L/day  Skin: stage II pressure ulcer on buttocks   Diet Order: Diet NPO time specified  EDUCATION NEEDS: -Education needs addressed   Intake/Output Summary (Last 24 hours) at 07/03/14 1445 Last data filed at 07/03/14 1300  Gross per 24 hour  Intake      3 ml  Output   2650 ml  Net  -2647 ml    Last BM: 2/20   Labs:   Recent Labs Lab 06/27/14 0450  07/01/14 0545 07/02/14 0410 07/03/14 0411  NA 137  < > 134* 139 148*  K 4.0  < > 4.8 4.7 4.2  CL 99  < > 96 100 106  CO2 28  < > 26 27 32  BUN 48*  < > 96* 119* 112*  CREATININE 1.93*  < > 2.63* 2.94* 2.43*  CALCIUM 8.3*  < > 8.3* 8.6 8.3*  MG 2.1  --   --  2.9* 3.1*  PHOS  --   --   --  6.0* 6.5*  GLUCOSE 121*  < >  125* 110* 106*  < > = values in this interval not displayed.  CBG (last 3)  No results for input(s): GLUCAP in the last 72 hours.  Scheduled Meds: . antiseptic oral rinse  7 mL Mouth Rinse q12n4p  . aspirin  81 mg Oral Daily  . chlorhexidine  15 mL Mouth Rinse BID  . ipratropium  0.5 mg Nebulization Q6H  . levalbuterol  1.25 mg Nebulization 4 times per day  . nicotine  21 mg Transdermal Daily  . pantoprazole (PROTONIX) IV  40 mg Intravenous Q24H  . piperacillin-tazobactam (ZOSYN)  IV  3.375 g Intravenous 3 times per day  . potassium chloride  20 mEq Oral BID  . sodium chloride  3 mL Intravenous Q12H  . vancomycin  750 mg Intravenous Q24H    Continuous Infusions:   Past Medical History  Diagnosis Date  . Aorto-iliac disease     a. s/p bilat with stent 2007 - followed by [facility].  . CAD (coronary artery disease)     a. 10/2011 s/p CABG x4 (LIMA-LAD, SVG-Diag, SVG-OM1 and SVG-PDA)  b. 03/2012 NSTEMI s/p LHC SVG-RCA  and LIMA-LAD but total occlusion of SVG-OM as well as SVG-diag. RX therapy recommended   . Hypertension   . ED (erectile dysfunction)   . COPD, severe   . Hyperlipidemia   . Stroke   . GERD (gastroesophageal reflux disease)   . Gout   . Paroxysmal atrial flutter     ablation  . Chronic combined systolic and diastolic CHF (congestive heart failure)     a. Last echo 04/2014: EF 40-45%.  Marland Kitchen. PAF (paroxysmal atrial fibrillation)   . CKD (chronic kidney disease), stage III   . Tobacco abuse   . History of noncompliance with medical treatment   . Carotid artery disease     a. RCEA 2013 - followed by [facility].  . Anemia   . Lower GI bleed     a. LGIB 10/2012: Colonoscopy performed June 21 showed blood in the colon but no definite source identified, diverticular versus AVM suspected. Aspirin stopped.  . Mitral regurgitation   . Hypertensive heart disease     Past Surgical History  Procedure Laterality Date  . Spinal fusion    . Hand surgery    . Neck fusion  1975  . Eye surgery    . Coronary artery bypass graft  10/31/2011    Procedure: CORONARY ARTERY BYPASS GRAFTING (CABG);  Surgeon: Delight OvensEdward B Gerhardt, MD;  [facility];  Service: Open Heart Surgery;  Laterality: N/A;  times three using  Left Internal Mammary Artery and Right Greater Saphenous Vein Graft harvested endoscopically; TEE  . Endarterectomy  10/31/2011    Procedure: ENDARTERECTOMY CAROTID;  Surgeon: Nada LibmanVance W Brabham, MD;  [facility];  Service: Vascular;  Laterality: Right;  with patch angioplasty   . Carotid endarterectomy  10-31-2011    right  . Colonoscopy N/A 10/30/2012    Procedure: COLONOSCOPY;  Surgeon: Shirley FriarVincent C. Schooler, MD;  [facility];  Service: Endoscopy;  Laterality: N/A;  . Left heart catheterization with coronary angiogram N/A 09/04/2011    Procedure: LEFT HEART CATHETERIZATION WITH CORONARY ANGIOGRAM;  Surgeon: Lesleigh NoeHenry W Smith III, MD;  [facility];  Service: Cardiovascular;  Laterality:  N/A;  . Left heart cath N/A 04/03/2012    Procedure: LEFT HEART CATH;  Surgeon: Lennette Biharihomas A Kelly, MD;  [facility];  Service: Cardiovascular;  Laterality: N/A;    Emmaline KluverHaley Shelley Cocke MS, RD, LDN  [phone] 

## 2014-07-03 NOTE — Progress Notes (Addendum)
Physical Therapy Treatment Patient Details Name: Nathan KellMaurice W [name] MRN: [id number] DOB: 02/06/1941 Today's Date: 07/03/2014    History of Present Illness 74 y.o. male with extensive CAD, COPD with ongoing tobacco abuse, chronic CHF, HTN, peripheral vascular disease, GERD, CKD stage III, recently hospitalized from 06/14/2014 to 06/18/2014 for an acute COPD exacerbation and now admitted after fall at home and sustained nondisplaced right-sided pelvic ring disruption now nonweightbearing right lower extremity and touch touchdown weightbearing left lower extremity. VQ scan with intermediate prob PE     PT Comments    Pt being followed by palliative care however agreeable to continue PT as tolerated.  Since pt on NRB and recently placed on ventimask, monitored saturations (>90% throughout session) and only assisted pt with bed level exercises as he really wished to move LEs.  Pt will likely need higher level of care upon d/c although uncertain d/c plans at this time.   Follow Up Recommendations  SNF;Supervision/Assistance - 24 hour     Equipment Recommendations  None recommended by PT    Recommendations for Other Services       Precautions / Restrictions Precautions Precautions: Fall Precaution Comments: monitor sats Restrictions Weight Bearing Restrictions: Yes RLE Weight Bearing: Non weight bearing LLE Weight Bearing: Non weight bearing    Mobility  Bed Mobility Overal bed mobility:  (not attempted due to pain, ventimask)                Transfers                    Ambulation/Gait                 Stairs            Wheelchair Mobility    Modified Rankin (Stroke Patients Only)       Balance                                    Cognition Arousal/Alertness: Awake/alert Behavior During Therapy: WFL for tasks assessed/performed Overall Cognitive Status: Impaired/Different from baseline Area of Impairment: Safety/judgement         Safety/Judgement: Decreased awareness of deficits          Exercises General Exercises - Lower Extremity Ankle Circles/Pumps: Both;10 reps;AROM Quad Sets: AROM;Both;10 reps Heel Slides: AAROM;Both;10 reps Hip ABduction/ADduction: AAROM;Both;10 reps    General Comments        Pertinent Vitals/Pain Pain Assessment: 0-10 Pain Score: 4  Pain Location: pelvis Pain Descriptors / Indicators: Sore Pain Intervention(s): Limited activity within patient's tolerance;Monitored during session;Premedicated before session;Repositioned    Home Living                      Prior Function            PT Goals (current goals can now be found in the care plan section) Progress towards PT goals: Progressing toward goals    Frequency  Min 1X/week    PT Plan Frequency needs to be updated    Co-evaluation             End of Session Equipment Utilized During Treatment: Oxygen Activity Tolerance: Patient limited by pain Patient left: in bed;with call bell/phone within reach;with family/visitor present     Time: [id number] PT Time Calculation (min) (ACUTE ONLY): 10 min  Charges:  $Therapeutic Exercise: 8-22 mins  G CodesSarajane Jews 07/22/14, 4:35 PM Zenovia Jarred, PT, DPT 07-22-2014 Pager: [phone]

## 2014-07-03 NOTE — Progress Notes (Signed)
Patient ID: Nathan [name], male   DOB: January 21, 1941, 74 y.o.   MRN: [id number]  [facility] PROGRESS NOTE  Nathan [name]:[id number] DOB: 09/28/1940 DOA: 06/23/2014 PCP: Lillia Mountain, MD   Brief narrative:    74 y.o. male with extensive CAD, COPD with ongoing tobacco abuse, chronic CHF, HTN, peripheral vascular disease, GERD, CKD stage III, recently hospitalized from 06/14/2014 to 06/18/2014 for an acute COPD exacerbation, now presented back to [facility] ED on 2/12 after an episode of fall that occurred several hours prior to this admission while he was trying to go to the bathroom. He stood up and his right leg gave away and he fell on his right side, subsequently unable to stand up and bear weight.   In ED, pt was hemodynamically stable, right hip diagnostic studies negative for fractures or dislocations. Blood work notable for Cr 1.61, WBC 11K. Pt admitted for further evaluation due to ongoing hip pain.   Major events since admission: 2/13 - MRI right hip with non displaced ring obturator fracture, conservative mngt per ortho  2/14 - dyspnea, lasix started as CXR with more vascular congestion, transferred to SDU  2/15 - VQ scan with intermediate prob PE, heparin drip started with Coumadin bridging  2/17 - developed sudden onset a-fib  2/20 - lethargy, hypoxia, oxygen sat 91% on 5 L, abd distension, ? SBO, transfer to ICU, PCCM consulted, ABX XRAY pending  2/21 - still lethargic, currently on BIPAP, started Zosyn and Vanc, overnight as pct > 10, palliative care team consulted  2/22 - transferred to medical unit, stable and doing better, off BiPAP  Assessment/Plan:    Acute respiratory failure with hypoxia - multifactorial and secondary to acute on chronic systolic CHF, PE, COPD (GOLD stage III) - pt looks better this AM, off BiPAP, still with crackles on exam - continue Lasix, weight this AM 169 lbs  Abdominal distention with one episode of vomiting 2/20 - abd still  distended but better, NGT pulled out and pt does not want the the tube - ileus still noted on abd XRAY - place on bowel regimen and avoid narcotics  Sepsis, severe, unclear source 2/20  - HR in 110 - 120's with RR in 30's, pct > 10 and 23, zosyn and vanc started 2/20 - no clear source, UA requested, blood cultures requested  - pt looks better this AM, improving, continue same ABX for now Acute on chronic systolic heart failure - repeat 2 D ECHO this admission with EF 40% - was initially on Lasix 60 mg IV BID - transitioned to oral Lasix 60 mg BID 2/17 and weight continued trending down - has gotten Lasix 40 mg IV x 2 doses on 2/20, renal function stabilizing  - weight trending: 187 lbs 2/14 --> 179 --> 175 --> 168 --> 173 --> 169 lbs this AM - continue lasix  A-fib with RVR - developed AM of 2/17  - changed albuterol to levalbuterol 2/17 - Patient has been on heparin and bridged to Coumadin but has not been given since INR remains supra therapeutic  Acute encephalopathy - clear mental status this AM, pt looks better  Intermediate probability of pulmonary emboli - INR trending down  - holding Coumadin and heparin  CAD status post CABG/acute on chronic systolic heart failure - per cardiology, added metoprolol IV as needed  Acute on chronic kidney disease stage III - Cr trending: 2.11 --> 1.97 --> 2.02 --> 2.40 --> 2.63 --> 2.94 --> 2.42 Leukocytosis - determined  to be secondary to reactive process  - WBC trending: 16 --> 14.7 --> 13.5 --> 9.6 --> 9.5 --> 5.0 Tobacco abuse - provide nicotine patch  Anemia of chronic disease, thrombocytopenia - no signs of active bleeding, Hg relatively stable  - Plt count stable  Right obturator ring fracture/right sacral and left fractures/bilateral hip girdle muscular strain/Right hip pain - nondisplaced right-sided pelvic ring disruption  - nonweightbearing right lower extremity, okay to weightbearing per ortho  - should be a  self-limited problem  - per ortho rec's, will need follow-up with Dr. August Saucer in 3 weeks for repeat radiographs to make sure everything is healing Ear lobe laceration - Status post repair Severe PCM - in the context of acute illness, has been NPO and will remain NPO for now   Prophylaxis - PPI for GI prophylaxis. Coumadin for a-fib now on hold due to supra therapeutic INR   Code Status: DNR Family Communication: plan of care discussed with wife and son at bedside  Disposition Plan: requires continued hospitalization    IV access:  Peripheral IV  Procedures and diagnostic studies:    CXR 07/02/2014 Nasogastric tube tip in the stomach, with side-port in the EG junction. This should be advanced for optimal placement. Central and basilar airspace opacities without significant interval change. There also are unchanged pleural effusions.   Dg Abd Acute W/chest 07/01/2014 Marked colonic distention which may reflect distal bowel obstruction or colonic ileus. No free air noted. 2. Congestive heart failure and bilateral airspace opacities.   Nm Pulmonary Perf/Vent 06/26/2014 Intermediate probability for pulmonary embolism by PIOPED II. Bibasilar airspace disease and pleural effusions significantly limits the utility of V/Q scan.   CXR 06/26/2014 Slight improvement in the left lung consolidation. Persistent edema and right pleural effusion.   CXR2/14/2016 Bilateral mid to lower lung heterogeneous opacities, left greater than right, with associated moderate right and probable small left pleural effusions. Findings may represent pulmonary edema, infection or underlying atelectasis.   CXR 06/24/2014 Decreased small bilateral pleural effusions and right basilar atelectasis. Stable cardiomegaly.   Mr Hip Right Wo Contrast 06/24/2014 Nondisplaced RIGHT obturator ring fractures. Complementary nondisplaced RIGHT sacral ala fracture. Bilateral hip girdle muscular strain. Anasarca up.  LEFT inguinal hernia containing a loop of sigmoid colon.   CXR 06/14/2014 Moderate right and small left pleural effusions, loculated left pleural effusion increased since 06/06/2014.   Dg Hip Unilat 06/23/2014 There is no acute bony abnormality of the right hip.   Ct Head Wo Contrast 06/23/2014 Atrophy and microvascular ischemic disease without acute intracranial process   Medical Consultants:  Cardiology - signed off PCT PCCM - signed off   Other Consultants:  PT  IAnti-Infectives:   Zosyn 2/20 --> Vancomycin 2/20 -->  Debbora Presto, MD  [facility] Pager [phone]  If 7PM-7AM, please contact night-coverage [email or web address] Password [email or web address] 07/03/2014, 10:07 AM   LOS: 9 days   HPI/Subjective: No events overnight.   Objective: Filed Vitals:   07/02/14 2008 07/03/14 0135 07/03/14 0445 07/03/14 0846  BP: 103/60  126/82   Pulse: 104  98   Temp:   97.8 F (36.6 C)   TempSrc:   Axillary   Resp: 24  20   Height:      Weight:      SpO2: 100% 99% 100% 100%    Intake/Output Summary (Last 24 hours) at 07/03/14 1007 Last data filed at 07/03/14 0451  Gross per 24 hour  Intake     25 ml  Output   1780  ml  Net  -1755 ml    Exam:   General:  Pt is alert, follows commands appropriately, not in acute distress  Cardiovascular: Irregular rate and rhythm, no rubs, no gallops  Respiratory: Bilateral rhonchi with no wheezing   Abdomen: Soft, non tender, slightly distended, bowel sounds present, no guarding  Extremities: trace bilateral LE pitting edema, pulses DP and PT palpable bilaterally  Neuro: Grossly nonfocal  Data Reviewed: Basic Metabolic Panel:  Recent Labs Lab 06/27/14 0450  06/29/14 0445 06/30/14 0522 07/01/14 0545 07/02/14 0410 07/03/14 0411  NA 137  < > 134* 137 134* 139 148*  K 4.0  < > 4.6 4.9 4.8 4.7 4.2  CL 99  < > 94* 96 96 100 106  CO2 28  < > 28 28 26 27  32  GLUCOSE 121*  < > 122* 117* 125* 110* 106*  BUN 48*  < > 63* 84* 96* 119* 112*   CREATININE 1.93*  < > 2.02* 2.40* 2.63* 2.94* 2.43*  CALCIUM 8.3*  < > 8.5 8.7 8.3* 8.6 8.3*  MG 2.1  --   --   --   --  2.9* 3.1*  PHOS  --   --   --   --   --  6.0* 6.5*  < > = values in this interval not displayed.  CBC:  Recent Labs Lab 06/29/14 0445 06/30/14 0522 07/01/14 0545 07/02/14 0410 07/03/14 0411  WBC 9.6 10.9* 9.5 6.7 5.0  HGB 11.0* 11.3* 10.9* 10.7* 10.9*  HCT 36.1* 36.2* 34.1* 34.1* 36.2*  MCV 82.2 81.5 80.8 81.8 83.4  PLT 107* 130* 123* 127* 128*     Recent Results (from the past 240 hour(s))  Culture, blood (routine x 2)     Status: None   Collection Time: 06/26/14  8:20 AM  Result Value Ref Range Status   Specimen Description BLOOD LEFT HAND  Final   Special Requests BOTTLES DRAWN AEROBIC ONLY 3CC  Final   Culture   Final    NO GROWTH 5 DAYS Performed at [facility]    Report Status 07/02/2014 FINAL  Final  Culture, blood (routine x 2)     Status: None   Collection Time: 06/26/14  8:23 AM  Result Value Ref Range Status   Specimen Description BLOOD LEFT ARM  Final   Special Requests BOTTLES DRAWN AEROBIC AND ANAEROBIC 10CC  Final   Culture   Final    NO GROWTH 5 DAYS Performed at [facility]    Report Status 07/02/2014 FINAL  Final  Culture, Urine     Status: None   Collection Time: 06/26/14  3:47 PM  Result Value Ref Range Status   Specimen Description URINE, CATHETERIZED  Final   Special Requests NONE  Final   Colony Count NO GROWTH Performed at [facility]   Final   Culture NO GROWTH Performed at [facility]   Final   Report Status 06/27/2014 FINAL  Final     Scheduled Meds: . antiseptic oral rinse  7 mL Mouth Rinse q12n4p  . aspirin  81 mg Oral Daily  . chlorhexidine  15 mL Mouth Rinse BID  . ipratropium  0.5 mg Nebulization Q6H  . levalbuterol  1.25 mg Nebulization 4 times per day  . nicotine  21 mg Transdermal Daily  . pantoprazole (PROTONIX) IV  40 mg Intravenous Q24H  .  piperacillin-tazobactam (ZOSYN)  IV  3.375 g Intravenous 3 times per day  . potassium chloride  20 mEq Oral BID  . sodium chloride  3 mL Intravenous Q12H  . vancomycin  750 mg Intravenous Q24H   Continuous Infusions:

## 2014-07-03 NOTE — Progress Notes (Signed)
Notes reviewed and discussion with Dr Marchelle Gearingamaswamy from yesterday. Plans noted for palliative care and home hospice. Please call if any further cardiology assistance needed. thx  Tonny BollmanMichael Dorotha Hirschi 07/03/2014 7:37 AM

## 2014-07-03 NOTE — Plan of Care (Signed)
Problem: Phase I Progression Outcomes Goal: Tolerating diet Outcome: Not Progressing Ice chips only

## 2014-07-04 ENCOUNTER — Inpatient Hospital Stay (HOSPITAL_COMMUNITY): Payer: Medicare PPO

## 2014-07-04 LAB — BASIC METABOLIC PANEL
Anion gap: 10 (ref 5–15)
BUN: 109 mg/dL — ABNORMAL HIGH (ref 6–23)
CO2: 31 mmol/L (ref 19–32)
Calcium: 8.3 mg/dL — ABNORMAL LOW (ref 8.4–10.5)
Chloride: 110 mmol/L (ref 96–112)
Creatinine, Ser: 2.36 mg/dL — ABNORMAL HIGH (ref 0.50–1.35)
GFR calc Af Amer: 29 mL/min — ABNORMAL LOW (ref >90)
GFR calc non Af Amer: 25 mL/min — ABNORMAL LOW (ref >90)
Glucose, Bld: 109 mg/dL — ABNORMAL HIGH (ref 70–99)
POTASSIUM: 4.1 mmol/L (ref 3.5–5.1)
SODIUM: 151 mmol/L — AB (ref 135–145)

## 2014-07-04 LAB — URINE CULTURE: Colony Count: >=100000

## 2014-07-04 LAB — CBC
HEMATOCRIT: 36.2 % — AB (ref 39.0–52.0)
HEMOGLOBIN: 11 g/dL — AB (ref 13.0–17.0)
MCH: 25.5 pg — ABNORMAL LOW (ref 26.0–34.0)
MCHC: 30.4 g/dL (ref 30.0–36.0)
MCV: 84 fL (ref 78.0–100.0)
Platelets: 154 10*3/uL (ref 150–400)
RBC: 4.31 MIL/uL (ref 4.22–5.81)
RDW: 17.2 % — AB (ref 11.5–15.5)
WBC: 5.8 10*3/uL (ref 4.0–10.5)

## 2014-07-04 LAB — PHOSPHORUS: Phosphorus: 4.5 mg/dL (ref 2.3–4.6)

## 2014-07-04 LAB — PROTIME-INR
INR: 4.68 — ABNORMAL HIGH (ref 0.00–1.49)
Prothrombin Time: 44.4 seconds — ABNORMAL HIGH (ref 11.6–15.2)

## 2014-07-04 LAB — MAGNESIUM: Magnesium: 2.9 mg/dL — ABNORMAL HIGH (ref 1.5–2.5)

## 2014-07-04 MED ORDER — ACETAMINOPHEN 325 MG PO TABS
650.0000 mg | ORAL_TABLET | Freq: Once | ORAL | Status: AC
  Administered 2014-07-04: 650 mg via ORAL
  Filled 2014-07-04: qty 2

## 2014-07-04 MED ORDER — GLYCERIN (LAXATIVE) 2.1 G RE SUPP
1.0000 | Freq: Two times a day (BID) | RECTAL | Status: AC
  Administered 2014-07-04 (×2): 1 via RECTAL
  Filled 2014-07-04 (×3): qty 1

## 2014-07-04 MED ORDER — VITAMIN K1 10 MG/ML IJ SOLN
5.0000 mg | Freq: Once | INTRAMUSCULAR | Status: AC
  Administered 2014-07-04: 5 mg via INTRAVENOUS
  Filled 2014-07-04: qty 0.5

## 2014-07-04 NOTE — Progress Notes (Signed)
Patient ID:Nathan [name]      DOB: 11/06/1940      NWG:[id number]   [id number]    Subjective: Much more alert today. Ate some applesauce and drank gingerale without issue this afternoon. Denies pain or dyspnea. Still no BM.      Filed Vitals:   07/04/14 1313  BP: 116/61  Pulse: 99  Temp: 97.5 F (36.4 C)  Resp: 19   Physical exam: GEN: alert, NAD on ventimask HEENT: sclera anicteric, mmm CV: tachy ABD: soft, ND EXT: warm  CBC    Component Value Date/Time   WBC 5.8 07/04/2014 0418   RBC 4.31 07/04/2014 0418   HGB 11.0* 07/04/2014 0418   HCT 36.2* 07/04/2014 0418   PLT 154 07/04/2014 0418   MCV 84.0 07/04/2014 0418   MCH 25.5* 07/04/2014 0418   MCHC 30.4 07/04/2014 0418   RDW 17.2* 07/04/2014 0418   LYMPHSABS 0.5* 06/26/2014 0357   MONOABS 0.7 06/26/2014 0357   EOSABS 0.0 06/26/2014 0357   BASOSABS 0.0 06/26/2014 0357    CMP     Component Value Date/Time   NA 151* 07/04/2014 0418   K 4.1 07/04/2014 0418   CL 110 07/04/2014 0418   CO2 31 07/04/2014 0418   GLUCOSE 109* 07/04/2014 0418   BUN 109* 07/04/2014 0418   CREATININE 2.36* 07/04/2014 0418   CREATININE 1.58* 09/18/2011 1224   CALCIUM 8.3* 07/04/2014 0418   PROT 5.5* 06/23/2014 1413   ALBUMIN 3.0* 06/23/2014 1413   AST 28 06/23/2014 1413   ALT 76* 06/23/2014 1413   ALKPHOS 53 06/23/2014 1413   BILITOT 0.7 06/23/2014 1413   GFRNONAA 25* 07/04/2014 0418   GFRAA 29* 07/04/2014 0418      Assessment and plan: 74 yo male with multiple medical problems admitted with R hip fracture, hospital course complicated by worsening hypoxic resp failure in setting of CHF, COPD, new PE. Worsening renal function and ileus.   1. Code Status: DNR  2. GOC: See previous documentation.  Continues to improve.  This is by far the best I have seen him here.  I still think his status is a bit tenuous though. I think big issues over next few days are his ongoing hypoxia  requiring ventimask and ileus. Hopefully he will be able to continue on this trajectory and not have setbacks.  I think there is some underlying encephalopathy still but much better. He is okay with going to SNF rehab if overcomes these issues. Spoke with his wife [name] at bedside as well.    3. Symptom Management:  1. Dyspnea- continue PRN fentanyl. Using much less 2. Delirium- improving with current management. 3. Ileus- management per primary team.  Tolerating small amount of feeds for comfort.   4. Psychosocial/Spiritual: [name] is 2nd wife. 1 son and there is question of another daughter whom he is estranged from. Not a spiritual person and family declines chaplain consult.    Orvis BrillAaron J. Fayetta Sorenson D.O. [facility]  Pager: [phone] Team Phone: [phone]

## 2014-07-04 NOTE — Progress Notes (Addendum)
Patient ID: Nathan [name], male   DOB: 06-09-1940, 74 y.o.   MRN: [id number]  [facility] PROGRESS NOTE  Nathan [name]:[id number] DOB: 1941-04-17 DOA: 06/23/2014 PCP: Lillia Mountain, MD   Brief narrative:    74 y.o. male with extensive CAD, COPD with ongoing tobacco abuse, chronic CHF, HTN, peripheral vascular disease, GERD, CKD stage III, recently hospitalized from 06/14/2014 to 06/18/2014 for an acute COPD exacerbation, now presented back to [facility] ED on 2/12 after an episode of fall that occurred several hours prior to this admission while he was trying to go to the bathroom. He stood up and his right leg gave away and he fell on his right side, subsequently unable to stand up and bear weight.   In ED, pt was hemodynamically stable, right hip diagnostic studies negative for fractures or dislocations. Blood work notable for Cr 1.61, WBC 11K. Pt admitted for further evaluation due to ongoing hip pain.   Major events since admission: 2/13 - MRI right hip with non displaced ring obturator fracture, conservative mngt per ortho  2/14 - dyspnea, lasix started as CXR with more vascular congestion, transferred to SDU  2/15 - VQ scan with intermediate prob PE, heparin drip started with Coumadin bridging  2/17 - developed sudden onset a-fib  2/20 - lethargy, hypoxia, oxygen sat 91% on 5 L, abd distension, ? SBO, transfer to ICU, PCCM consulted, ABX XRAY pending  2/21 - still lethargic, currently on BIPAP, started Zosyn and Vanc, overnight as pct > 10, palliative care team consulted  2/22 - transferred to medical unit, stable and doing better, off BiPAP, abd XRAY with persistent ileus, pt refusing NGT 2/23 - no BM since 2/20, pt asking to eat but aware of ileus, abd more distended this AM, still refusing NGT   Assessment/Plan:   Acute respiratory failure with hypoxia - multifactorial and secondary to acute on chronic systolic CHF, PE, COPD (GOLD stage III) - was initially  getting better but please note the events course since 2/20 when pt developed respiratory distress again - pt looks better this AM, off BiPAP, minimal crackles on exam this AM 2/23 - weight this AM 169 lbs and has been stable over the past 24 hours  - since Na up this AM, will hold Lasix for today and resume possibly in AM depending on clinical status  - will also continue bronchodilators scheduled and as needed (levalbuterol and ipratropium per PCCM) - please note that [facility] Coumadin on hold due to supra therapeutic INR  Abdominal distention with one episode of vomiting 2/20 - abd still distended but better compared to 2/22, NGT pulled out 2/22 and pt does not want the the tube to be placed again - ileus still noted on abd XRAY 2/22 and pt also asking to eat - placed on bowel regimen and avoiding narcotics as possible - discussed comfort feeding if he really wants but I made him aware if he becomes more nauseated he needs to let us know  - plan on repeat ABD XRAY in AM for follow up   Sepsis, severe, 2/20, possible E. Coli UTI - HR in 110 - 120's with RR in 30's, pct > 10 and 23, zosyn and vanc started 2/20 - please note that blood cultures from 2/15 and again from 2/21 so far negative to date  - urine culture preliminary report with E. Coli but final report pending  - pt looks better this AM, improving, continue same ABX for now and narrow down  once final report on urine culture available  - will possibly be able to d/c vanc in AM Acute on chronic systolic heart failure - repeat 2 D ECHO this admission with EF 40% - was initially on Lasix 60 mg IV BID - transitioned to oral Lasix 60 mg BID 2/17 and weight continued trending down - has gotten Lasix 40 mg IV x 2 doses on 2/20, and starting 2/22 Lasix dose lowered to 20 mg BID IV - weight trending: 187 lbs 2/14 --> 179 --> 175 --> 168 --> 173 --> 169 --> 169 lbs this AM - due to hypernatremia, will stop Lasix today as I suspect pt more dry from  not eating and getting lasix  - resume lasix once clinically indicated  A-fib with RVR - developed AM of 2/17  - changed albuterol to levalbuterol 2/17 - Patient has been on heparin and bridged to Coumadin but has not been given since 2/20 as INR remains supra therapeutic  - will give dose of Vit K today IV 2/23  Acute encephalopathy, worse 2/20, from sepsis UTI E. Coli, COPD, acute systolic CHF  - clear mental status this AM, pt looks better  Intermediate probability of pulmonary emboli - INR still up, will gave dose of Vit K - holding Coumadin and heparin as noted above  CAD status post CABG/acute on chronic systolic heart failure - per cardiology, added metoprolol IV as needed  - continue aspirin  Acute on chronic kidney disease stage III - Cr trending: 2.11 --> 1.97 --> 2.02 --> 2.40 --> 2.63 --> 2.94 --> 2.42 --> 2.36 - hold Lasix as noted above, repeat BMP in AM Hypernatremia - likely pre renal from no oral intake and getting lasix - will hold lasix today but will avoid IVF due to propensity for volume overload - will allow clear liquids today and repeat BMP in AM Leukocytosis - determined to be secondary to reactive process  - WBC trending: 16 --> 14.7 --> 13.5 --> 9.6 --> 9.5 --> 5.8 Tobacco abuse - provide nicotine patch  Anemia of chronic disease, thrombocytopenia - no signs of active bleeding, Hg relatively stable  - Plt count stable  Right obturator ring fracture/right sacral and left fractures/bilateral hip girdle muscular strain/Right hip pain - nondisplaced right-sided pelvic ring disruption  - nonweightbearing right lower extremity, okay to weightbearing per ortho  - should be a self-limited problem  - per ortho rec's, will need follow-up with Dr. August Saucerean in 3 weeks for repeat radiographs to make sure everything is healing Acute functional quadriplegia - from acute illness and recent right obturator fracture - try to get OOB to chair  Ear lobe  laceration - Status post repair Severe PCM - in the context of acute illness, has been NPO and will remain NPO for now  Prophylaxis - PPI for GI prophylaxis. Coumadin for a-fib now on hold due to supra therapeutic INR   Code Status: DNR Family Communication: plan of care discussed with wife and son at bedside  Disposition Plan: requires continued hospitalization until INR stabilizes and ileus resolved, once ready will go to SNF  IV access:  Peripheral IV  Procedures and diagnostic studies:    Dg Abd Acute W/chest  07/03/2014   Diffuse colonic dilation with air-fluid levels. There are no findings to suggest a small bowel obstruction. Findings likely due to a diffuse colonic adynamic ileus. Distal colonic obstruction is also possible. 2. No free air. 3. Lung findings consistent with congestive heart failure and bilateral  pleural effusions. 4. No significant change in the appearance of the chest or abdomen when compared to the study dated 07/01/2014.     CXR 07/02/2014 Nasogastric tube tip in the stomach, with side-port in the EG junction. This should be advanced for optimal placement. Central and basilar airspace opacities without significant interval change. There also are unchanged pleural effusions.   Dg Abd Acute W/chest 07/01/2014 Marked colonic distention which may reflect distal bowel obstruction or colonic ileus. No free air noted. Congestive heart failure and bilateral airspace opacities.   Nm Pulmonary Perf/Vent 06/26/2014 Intermediate probability for pulmonary embolism by PIOPED II. Bibasilar airspace disease and pleural effusions significantly limits the utility of V/Q scan.   CXR 06/26/2014 Slight improvement in the left lung consolidation. Persistent edema and right pleural effusion.   CXR2/14/2016 Bilateral mid to lower lung heterogeneous opacities, left greater than right, with associated moderate right and probable small left pleural effusions. Findings may  represent pulmonary edema, infection or underlying atelectasis.   CXR 06/24/2014 Decreased small bilateral pleural effusions and right basilar atelectasis. Stable cardiomegaly.   Mr Hip Right Wo Contrast 06/24/2014 Nondisplaced RIGHT obturator ring fractures. Complementary nondisplaced RIGHT sacral ala fracture. Bilateral hip girdle muscular strain. Anasarca up. LEFT inguinal hernia containing a loop of sigmoid colon.   CXR 06/14/2014 Moderate right and small left pleural effusions, loculated left pleural effusion increased since 06/06/2014.   Dg Hip Unilat 06/23/2014 There is no acute bony abnormality of the right hip.   Ct Head Wo Contrast 06/23/2014 Atrophy and microvascular ischemic disease without acute intracranial process  Medical Consultants:  Cardiology - signed off PCT PCCM - signed off   Other Consultants:  PT  IAnti-Infectives:   Vancomycin 2/20 --> Zosyn 2/20 -->  Debbora Presto, MD  [facility] Pager [phone]  If 7PM-7AM, please contact night-coverage [email or web address] Password [facility] 07/04/2014, 4:01 PM   LOS: 10 days   HPI/Subjective: No events overnight.   Objective: Filed Vitals:   07/04/14 0121 07/04/14 0607 07/04/14 1015 07/04/14 1313  BP:  123/72  116/61  Pulse:  100  99  Temp:  97.5 F (36.4 C)  97.5 F (36.4 C)  TempSrc:  Oral  Oral  Resp:  20  19  Height:      Weight:      SpO2: 97% 95% 92% 100%    Intake/Output Summary (Last 24 hours) at 07/04/14 1601 Last data filed at 07/04/14 1203  Gross per 24 hour  Intake   1400 ml  Output   1750 ml  Net   -350 ml    Exam:   General:  Pt is alert, follows commands appropriately, not in acute distress  Cardiovascular: Irregular rate and rhythm,  no rubs, no gallops  Respiratory: Clear to auscultation bilaterally, scattered rhonchi with mild bibasilar crackles   Abdomen: Soft, non tender, slightly distended, bowel sounds present, no guarding  Extremities: +1 bilateral LE pitting  edema, pulses DP and PT palpable bilaterally  Neuro: Grossly nonfocal  Data Reviewed: Basic Metabolic Panel:  Recent Labs Lab 06/30/14 0522 07/01/14 0545 07/02/14 0410 07/03/14 0411 07/04/14 0418  NA 137 134* 139 148* 151*  K 4.9 4.8 4.7 4.2 4.1  CL 96 96 100 106 110  CO2 32 31  GLUCOSE 117* 125* 110* 106* 109*  BUN 84* 96* 119* 112* 109*  CREATININE 2.40* 2.63* 2.94* 2.43* 2.36*  CALCIUM 8.7 8.3* 8.6 8.3* 8.3*  MG  --   --  2.9* 3.1* 2.9*  PHOS  --   --  6.0* 6.5* 4.5   CBC:  Recent Labs Lab 06/30/14 0522 07/01/14 0545 07/02/14 0410 07/03/14 0411 07/04/14 0418  WBC 10.9* 9.5 6.7 5.0 5.8  HGB 11.3* 10.9* 10.7* 10.9* 11.0*  HCT 36.2* 34.1* 34.1* 36.2* 36.2*  MCV 81.5 80.8 81.8 83.4 84.0  PLT 130* 123* 127* 128* 154     Recent Results (from the past 240 hour(s))  Culture, blood (routine x 2)     Status: None   Collection Time: 06/26/14  8:20 AM  Result Value Ref Range Status   Specimen Description BLOOD LEFT HAND  Final   Special Requests BOTTLES DRAWN AEROBIC ONLY 3CC  Final   Culture   Final    NO GROWTH 5 DAYS Performed at [facility]    Report Status 07/02/2014 FINAL  Final  Culture, blood (routine x 2)     Status: None   Collection Time: 06/26/14  8:23 AM  Result Value Ref Range Status   Specimen Description BLOOD LEFT ARM  Final   Special Requests BOTTLES DRAWN AEROBIC AND ANAEROBIC 10CC  Final   Culture   Final    NO GROWTH 5 DAYS Performed at [facility]    Report Status 07/02/2014 FINAL  Final  Culture, Urine     Status: None   Collection Time: 06/26/14  3:47 PM  Result Value Ref Range Status   Specimen Description URINE, CATHETERIZED  Final   Special Requests NONE  Final   Colony Count NO GROWTH Performed at [facility]   Final   Culture NO GROWTH Performed at [facility]   Final   Report Status 06/27/2014 FINAL  Final  Culture, blood (routine x 2)     Status: None (Preliminary result)    Collection Time: 07/02/14  9:15 AM  Result Value Ref Range Status   Specimen Description BLOOD RIGHT ARM  Final   Special Requests   Final    BOTTLES DRAWN AEROBIC AND ANAEROBIC 10CC BOTH BOTTLES   Culture   Final           BLOOD CULTURE RECEIVED NO GROWTH TO DATE CULTURE WILL BE HELD FOR 5 DAYS BEFORE ISSUING A FINAL NEGATIVE REPORT Performed at [facility]    Report Status PENDING  Incomplete  Culture, blood (routine x 2)     Status: None (Preliminary result)   Collection Time: 07/02/14  9:21 AM  Result Value Ref Range Status   Specimen Description BLOOD RIGHT ARM  Final   Special Requests   Final    BOTTLES DRAWN AEROBIC AND ANAEROBIC 10CC BOTH BOTTLES   Culture   Final           BLOOD CULTURE RECEIVED NO GROWTH TO DATE CULTURE WILL BE HELD FOR 5 DAYS BEFORE ISSUING A FINAL NEGATIVE REPORT Performed at [facility]    Report Status PENDING  Incomplete  Culture, Urine     Status: None (Preliminary result)   Collection Time: 07/02/14 10:04 AM  Result Value Ref Range Status   Specimen Description URINE, CATHETERIZED  Final   Special Requests NONE  Final   Colony Count   Final    >=100,000 COLONIES/ML Performed at [facility]    Culture   Final    ESCHERICHIA COLI Performed at [facility]    Report Status PENDING  Incomplete     Scheduled Meds: . antiseptic oral rinse  7 mL Mouth Rinse q12n4p  . aspirin  81 mg Oral Daily  .  chlorhexidine  15 mL Mouth Rinse BID  . furosemide  20 mg Intravenous BID  . Glycerin (Adult)  1 suppository Rectal BID  . ipratropium  0.5 mg Nebulization Q6H  . levalbuterol  1.25 mg Nebulization 4 times per day  . nicotine  21 mg Transdermal Daily  . pantoprazole (PROTONIX) IV  40 mg Intravenous Q24H  . piperacillin-tazobactam (ZOSYN)  IV  3.375 g Intravenous 3 times per day  . potassium chloride  20 mEq Oral BID  . sennosides  5 mL Oral BID  . sodium chloride  3 mL Intravenous Q12H  . vancomycin  750 mg  Intravenous Q24H   Continuous Infusions:

## 2014-07-04 NOTE — Progress Notes (Signed)
ANTIBIOTIC CONSULT NOTE  Pharmacy Consult for vancomycin Indication: pneumonia  No Known Allergies  Patient Measurements: Height: 6\' 1"  (185.4 cm) Weight: 169 lb 12.1 oz (77 kg) IBW/kg (Calculated) : 79.9   Assessment: 74yo admitted 2/12 after a fall at home. Pt is known to pharmacy this admission for elevated INR/warfarin dosing. Noted recent admission 2/3-2/7 for COPD exacerbation. On 2/21, patient with elevated procalcitonin and bilateral infiltrates. Pharmacy is consulted to dose vancomycin per Pharmacy for possible HAP. Zosyn ordered per MD.  Antiinfectives 2/21 >> vancomcyin >> 2/21 >> Zosyn (MD) >>   Labs / vitals Tmax: remains afebrile WBCs: remains WNL Renal: SCr 2.36, Slowly improving. CrCl 28 ml/min CG/N  Procalc: 23 > 16  Microbiology 2/15 urine: NGF 2/15 blood: NGF 2/21 urine: >100k E coli, sensitivities pending 2/21 blood x2: ngtd   Goal of Therapy:  Vancomycin trough level 15-20 mcg/ml  Plan:  - continue vancomycin 750mg  IV q24h - continue Zosyn 3.375G IV q8h, each dose to be infused over 4 hours, per MD - vancomycin trough at steady state if indicated - follow-up clinical course, culture results, renal function - follow-up antibiotic de-escalation and length of therapy  Thank you for the consult.  Ross LudwigJesse Patirica Longshore Akers, PharmD, BCPS Pager: [phone] Pharmacy: [phone] 07/04/2014 9:50 AM

## 2014-07-04 NOTE — Progress Notes (Signed)
Nursing staff reported more abd discomfort with nausea. Will ask for abd XRAY now instead of in the AM. May need NGT if progressive ileus. Keep NPO for now.  Debbora PrestoMAGICK-Nidhi Jacome, MD  [facility] Pager [phone]  If 7PM-7AM, please contact night-coverage [email or web address] Password [email or web address]

## 2014-07-05 DIAGNOSIS — K567 Ileus, unspecified: Secondary | ICD-10-CM | POA: Clinically undetermined

## 2014-07-05 DIAGNOSIS — E43 Unspecified severe protein-calorie malnutrition: Secondary | ICD-10-CM

## 2014-07-05 LAB — BASIC METABOLIC PANEL
Anion gap: 8 (ref 5–15)
BUN: 89 mg/dL — AB (ref 6–23)
CHLORIDE: 105 mmol/L (ref 96–112)
CO2: 32 mmol/L (ref 19–32)
Calcium: 7.8 mg/dL — ABNORMAL LOW (ref 8.4–10.5)
Creatinine, Ser: 1.98 mg/dL — ABNORMAL HIGH (ref 0.50–1.35)
GFR calc Af Amer: 36 mL/min — ABNORMAL LOW (ref >90)
GFR, EST NON AFRICAN AMERICAN: 31 mL/min — AB (ref >90)
Glucose, Bld: 105 mg/dL — ABNORMAL HIGH (ref 70–99)
POTASSIUM: 3.3 mmol/L — AB (ref 3.5–5.1)
SODIUM: 145 mmol/L (ref 135–145)

## 2014-07-05 LAB — CBC
HCT: 34.1 % — ABNORMAL LOW (ref 39.0–52.0)
Hemoglobin: 10.1 g/dL — ABNORMAL LOW (ref 13.0–17.0)
MCH: 24.8 pg — ABNORMAL LOW (ref 26.0–34.0)
MCHC: 29.6 g/dL — AB (ref 30.0–36.0)
MCV: 83.8 fL (ref 78.0–100.0)
PLATELETS: 136 10*3/uL — AB (ref 150–400)
RBC: 4.07 MIL/uL — ABNORMAL LOW (ref 4.22–5.81)
RDW: 17 % — ABNORMAL HIGH (ref 11.5–15.5)
WBC: 5.8 10*3/uL (ref 4.0–10.5)

## 2014-07-05 LAB — PROTIME-INR
INR: 1.58 — AB (ref 0.00–1.49)
Prothrombin Time: 19 seconds — ABNORMAL HIGH (ref 11.6–15.2)

## 2014-07-05 LAB — PHOSPHORUS: Phosphorus: 2.9 mg/dL (ref 2.3–4.6)

## 2014-07-05 LAB — MAGNESIUM: Magnesium: 2.7 mg/dL — ABNORMAL HIGH (ref 1.5–2.5)

## 2014-07-05 LAB — HEPARIN LEVEL (UNFRACTIONATED): Heparin Unfractionated: 0.11 IU/mL — ABNORMAL LOW (ref 0.30–0.70)

## 2014-07-05 MED ORDER — FUROSEMIDE 40 MG PO TABS
40.0000 mg | ORAL_TABLET | Freq: Every day | ORAL | Status: DC
  Administered 2014-07-05 – 2014-07-10 (×6): 40 mg via ORAL
  Filled 2014-07-05 (×6): qty 1

## 2014-07-05 MED ORDER — HEPARIN BOLUS VIA INFUSION
2000.0000 [IU] | Freq: Once | INTRAVENOUS | Status: AC
  Administered 2014-07-05: 2000 [IU] via INTRAVENOUS
  Filled 2014-07-05: qty 2000

## 2014-07-05 MED ORDER — DOCUSATE SODIUM 100 MG PO CAPS
100.0000 mg | ORAL_CAPSULE | Freq: Two times a day (BID) | ORAL | Status: DC
  Administered 2014-07-07 – 2014-07-10 (×6): 100 mg via ORAL
  Filled 2014-07-05 (×10): qty 1

## 2014-07-05 MED ORDER — HEPARIN (PORCINE) IN NACL 100-0.45 UNIT/ML-% IJ SOLN
1750.0000 [IU]/h | INTRAMUSCULAR | Status: DC
  Administered 2014-07-05: 1750 [IU]/h via INTRAVENOUS
  Administered 2014-07-05: 1500 [IU]/h via INTRAVENOUS
  Filled 2014-07-05 (×4): qty 250

## 2014-07-05 MED ORDER — CEFTRIAXONE SODIUM IN DEXTROSE 20 MG/ML IV SOLN
1.0000 g | INTRAVENOUS | Status: DC
  Administered 2014-07-05: 1 g via INTRAVENOUS
  Filled 2014-07-05 (×2): qty 50

## 2014-07-05 MED ORDER — POLYETHYLENE GLYCOL 3350 17 G PO PACK
17.0000 g | PACK | Freq: Every day | ORAL | Status: DC
  Administered 2014-07-07 – 2014-07-10 (×3): 17 g via ORAL
  Filled 2014-07-05 (×5): qty 1

## 2014-07-05 NOTE — Progress Notes (Signed)
ANTICOAGULATION CONSULT NOTE - Initial Consult  Pharmacy Consult for Heparin Indication: pulmonary embolus  No Known Allergies  Patient Measurements: Height: 6\' 1"  (185.4 cm) Weight: 169 lb 12.1 oz (77 kg) IBW/kg (Calculated) : 79.9 Heparin Dosing Weight: 77 kg (actual)  Vital Signs: Temp: 97.6 F (36.4 C) (02/24 0607) Temp Source: Tympanic (02/24 0607) BP: 107/51 mmHg (02/24 0607) Pulse Rate: 104 (02/24 0607)  Labs:  Recent Labs  07/03/14 0411 07/04/14 0418 07/05/14 0417  HGB 10.9* 11.0* 10.1*  HCT 36.2* 36.2* 34.1*  PLT 128* 154 136*  LABPROT 45.7* 44.4* 19.0*  INR 4.86* 4.68* 1.58*  CREATININE 2.43* 2.36* 1.98*    Estimated Creatinine Clearance: 35.1 mL/min (by C-G formula based on Cr of 1.98).   Medical History: Past Medical History  Diagnosis Date  . Aorto-iliac disease     a. s/p bilat with stent 2007 - followed by [facility].  . CAD (coronary artery disease)     a. 10/2011 s/p CABG x4 (LIMA-LAD, SVG-Diag, SVG-OM1 and SVG-PDA)  b. 03/2012 NSTEMI s/p LHC SVG-RCA and LIMA-LAD but total occlusion of SVG-OM as well as SVG-diag. RX therapy recommended   . Hypertension   . ED (erectile dysfunction)   . COPD, severe   . Hyperlipidemia   . Stroke   . GERD (gastroesophageal reflux disease)   . Gout   . Paroxysmal atrial flutter     ablation  . Chronic combined systolic and diastolic CHF (congestive heart failure)     a. Last echo 04/2014: EF 40-45%.  Marland Kitchen. PAF (paroxysmal atrial fibrillation)   . CKD (chronic kidney disease), stage III   . Tobacco abuse   . History of noncompliance with medical treatment   . Carotid artery disease     a. RCEA 2013 - followed by [facility].  . Anemia   . Lower GI bleed     a. LGIB 10/2012: Colonoscopy performed June 21 showed blood in the colon but no definite source identified, diverticular versus AVM suspected. Aspirin stopped.  . Mitral regurgitation   . Hypertensive heart disease     Medications:  Scheduled:  . antiseptic oral rinse   7 mL Mouth Rinse q12n4p  . aspirin  81 mg Oral Daily  . chlorhexidine  15 mL Mouth Rinse BID  . ipratropium  0.5 mg Nebulization Q6H  . levalbuterol  1.25 mg Nebulization 4 times per day  . nicotine  21 mg Transdermal Daily  . pantoprazole (PROTONIX) IV  40 mg Intravenous Q24H  . piperacillin-tazobactam (ZOSYN)  IV  3.375 g Intravenous 3 times per day  . potassium chloride  20 mEq Oral BID  . sennosides  5 mL Oral BID  . sodium chloride  3 mL Intravenous Q12H  . vancomycin  750 mg Intravenous Q24H   Infusions:   PRN: sodium chloride, fentaNYL, ipratropium, levalbuterol, metoprolol, sodium chloride  Assessment: 74 yo male admitted 06/23/14 s/p fall. He was recently admitted 2/3-06/18/14 for an acute COPD exacerbation. On 2/14, he developed acute respiratory distress, pO2 below reportable range and elevated ddimer. Per MD, plan for VQ scan, LE dopplers, and Pharmacy consulted to dose IV heparin for 2/14, warfarin added 2/16. Warfarin and heparin were stopped 2/20 due to concerns for SBO and supratherapeutic INR. Per pharmacist discussion with MD, plan was to restart heparin when INR < 3.0.   INR this AM down 1.58 following vitamin K 5mg  on 2/23  Hgb stable at 10.1  Plts low but stable at 136k  No bleeding reported  Previous  heparin levels and rates reviewed - rates ranged from 1300 units/hr to 1800 units/hr with levels ranging from 0.17 to 0.63 IU/ml   Goal of Therapy:  Heparin level 0.3-0.7 units/ml Monitor platelets by anticoagulation protocol: Yes   Plan:   No heparin bolus  Heparin infusion 1500 units/hr  Check heparin level in 8 hours  Daily heparin level and CBC  Loralee Pacas, PharmD, BCPS Pager: [phone] 07/05/2014,7:37 AM

## 2014-07-05 NOTE — Progress Notes (Signed)
ANTICOAGULATION CONSULT NOTE - Initial Consult  Pharmacy Consult for Heparin Indication: pulmonary embolus  No Known Allergies  Patient Measurements: Height:  (185.4 cm) Weight: 169 lb 12.1 oz (77 kg) IBW/kg (Calculated) : 79.9 Heparin Dosing Weight: 77 kg (actual)  Vital Signs: Temp: 97.7 F (36.5 C) (02/24 1426) Temp Source: Oral (02/24 1426) BP: 104/61 mmHg (02/24 1426) Pulse Rate: 101 (02/24 1426)  Labs:  Recent Labs  07/03/14 0411 07/04/14 0418 07/05/14 0417 07/05/14 1835  HGB 10.9* 11.0* 10.1*  --   HCT 36.2* 36.2* 34.1*  --   PLT 128* 154 136*  --   LABPROT 45.7* 44.4* 19.0*  --   INR 4.86* 4.68* 1.58*  --   HEPARINUNFRC  --   --   --  0.11*  CREATININE 2.43* 2.36* 1.98*  --     Estimated Creatinine Clearance: 35.1 mL/min (by C-G formula based on Cr of 1.98).   Medical History: Past Medical History  Diagnosis Date  . Aorto-iliac disease     a. s/p bilat with stent 2007 - followed by [facility].  . CAD (coronary artery disease)     a. 10/2011 s/p CABG x4 (LIMA-LAD, SVG-Diag, SVG-OM1 and SVG-PDA)  b. 03/2012 NSTEMI s/p LHC SVG-RCA and LIMA-LAD but total occlusion of SVG-OM as well as SVG-diag. RX therapy recommended   . Hypertension   . ED (erectile dysfunction)   . COPD, severe   . Hyperlipidemia   . Stroke   . GERD (gastroesophageal reflux disease)   . Gout   . Paroxysmal atrial flutter     ablation  . Chronic combined systolic and diastolic CHF (congestive heart failure)     a. Last echo 04/2014: EF 40-45%.  Marland Kitchen PAF (paroxysmal atrial fibrillation)   . CKD (chronic kidney disease), stage III   . Tobacco abuse   . History of noncompliance with medical treatment   . Carotid artery disease     a. RCEA 2013 - followed by [facility].  . Anemia   . Lower GI bleed     a. LGIB 10/2012: Colonoscopy performed June 21 showed blood in the colon but no definite source identified, diverticular versus AVM suspected. Aspirin stopped.  . Mitral regurgitation   .  Hypertensive heart disease     Medications:  Scheduled:  . antiseptic oral rinse  7 mL Mouth Rinse q12n4p  . aspirin  81 mg Oral Daily  . cefTRIAXone (ROCEPHIN)  IV  1 g Intravenous Q24H  . chlorhexidine  15 mL Mouth Rinse BID  . docusate sodium  100 mg Oral BID  . furosemide  40 mg Oral Daily  . heparin  2,000 Units Intravenous Once  . ipratropium  0.5 mg Nebulization Q6H  . levalbuterol  1.25 mg Nebulization 4 times per day  . nicotine  21 mg Transdermal Daily  . pantoprazole (PROTONIX) IV  40 mg Intravenous Q24H  . polyethylene glycol  17 g Oral Daily  . potassium chloride  20 mEq Oral BID  . sennosides  5 mL Oral BID  . sodium chloride  3 mL Intravenous Q12H   Infusions:  . heparin 1,500 Units/hr (07/05/14 1027)   PRN: sodium chloride, fentaNYL, ipratropium, levalbuterol, metoprolol, sodium chloride  Assessment: 74 yo male admitted 06/23/14 s/p fall. He was recently admitted 2/3-06/18/14 for an acute COPD exacerbation. On 2/14, he developed acute respiratory distress, pO2 below reportable range and elevated d-dimer. Per MD, plan for VQ scan, LE dopplers, and pharmacy consulted to dose IV heparin for 2/14,  warfarin added 2/16. Warfarin and heparin were stopped 2/20 due to concerns for SBO and supratherapeutic INR. Per pharmacist discussion with MD, plan was to restart heparin when INR < 3.0.   INR this AM down 1.58 following vitamin K 5mg  on 2/23  Hgb stable at 10.1  Plts low but stable at 136k  No bleeding reported  Previous heparin levels and rates reviewed - rates ranged from 1300 units/hr to 1800 units/hr with levels ranging from 0.17 to 0.63 IU/ml  1st heparin level today since resuming heparin infusion = 0.11, SUBtherapeutic on 1500 units/hr  No line issues/complications per nursing  Goal of Therapy:  Heparin level 0.3-0.7 units/ml Monitor platelets by anticoagulation protocol: Yes   Plan:   Heparin 2000 units IV bolus x 1.  Increase heparin infusion to 1750  units/hr.  Check heparin level in 8 hours.  Daily heparin level and CBC.  Monitor for signs/symptoms of bleeding.   Greer PickerelJigna Naje Rice, PharmD, BCPS Pager: [phone] 07/05/2014 7:38 PM

## 2014-07-05 NOTE — Progress Notes (Signed)
CSW continuing to follow.   CSW met with pt and pt son at bedside.   CSW introduced self and explained role as pt recently placed on [address].   Pt appeared in good spirits and states that he just had some lunch including cranberry juice and jello.  CSW discussed rehab at SNF and pt son stated that a final decision has not yet been made about disposition, but rehab is an option being discuss. CSW expressed understanding and discussed that CSW can continue to follow pt progress and be of assistance with SNF if appropriate.   CSW discussed with pt and pt son that pt and pt wife had expressed interest in [facility] prior to pt having a medical set back. Per PMT MD note, pt expressed that he is okay with going to SNF rehab if he overcomes these issues. Pt expressed agreement for rehab with this CSW pending his progress as well.   Pt son stated that they plan to meet with the doctor tomorrow at 10 am and requested for CSW to follow up with pt and pt family tomorrow morning. CSW will follow up with pt and pt family tomorrow.   CSW to continue to follow to provide support and assistance surrounding disposition planning.  Alison Murray, MSW, [place] [employer] [phone]

## 2014-07-05 NOTE — Progress Notes (Signed)
Patient ID: Nathan [name] Dohse, male   DOB: 02/22/1941, 74 y.o.   MRN: [id number]  [facility] PROGRESS NOTE  Nathan [name] Freiberger [id number] DOB: 01/09/1941 DOA: 06/23/2014 PCP: Lillia MountainGRIFFIN,JOHN JOSEPH, MD   Brief narrative:    74 y.o. male with extensive CAD, COPD with ongoing tobacco abuse, chronic CHF, HTN, peripheral vascular disease, GERD, CKD stage III, recently hospitalized from 06/14/2014 to 06/18/2014 for an acute COPD exacerbation, presented [facility] ED on 2/12 after an episode of fall that occurred several hours prior to this admission while he was trying to go to the bathroom. He stood up and his right leg gave away and he fell on his right side, subsequently unable to stand up and bear weight.   Major events since admission: 2/13 - MRI right hip with non displaced ring obturator fracture, conservative mngt per ortho  2/14 - dyspnea, lasix started as CXR with more vascular congestion, transferred to SDU  2/15 - VQ scan with intermediate prob PE, heparin drip started with Coumadin bridging  2/17 - developed sudden onset a-fib  2/20 - lethargy, hypoxia, oxygen sat 91% on 5 L, abd distension, ? SBO, transfer to ICU, PCCM consulted, ABX XRAY pending  2/21 - still lethargic, currently on BIPAP, started Zosyn and Vanc, overnight as pct > 10, palliative care team consulted  2/22 - transferred to medical unit, stable and doing better, off BiPAP, abd XRAY with persistent ileus, pt refusing NGT 2/23 - no BM since 2/20, pt asking to eat but aware of ileus, abd more distended this AM, still refusing NGT   Assessment/Plan:   Acute respiratory failure with hypoxia - This is multifactorial and secondary to acute on chronic systolic CHF, PE, COPD (GOLD stage III) - was initially getting better but please note the events course since 2/20 when pt developed respiratory distress again - Patient remains on a Ventimask, but appears to be improved this morning.  - weight has been stable -  will also continue bronchodilators scheduled and as needed (levalbuterol and ipratropium per PCCM) - [facility] due to supra therapeutic INR   Colonic ileus with Abdominal distention - Abdomen is improved this morning. He had a bowel movement and is feeling much better. Give him ice chips for now.  - placed on bowel regimen and avoiding narcotics as possible  Sepsis, severe, 2/20, E. Coli UTI - Initially HR in 110 - 120's with RR in 30's, pct > 10 and 23, zosyn and vanc started 2/20 - please note that blood cultures from 2/15 and again from 2/21 so far negative to date  - urine culture report with E. Coli. Change antibiotics to ceftriaxone based on sensitivities.  Acute on chronic systolic heart failure - repeat 2 D ECHO this admission with EF 40% - was initially on Lasix 60 mg IV BID - transitioned to oral Lasix 60 mg BID 2/17 and weight continued trending down - has gotten Lasix 40 mg IV x 2 doses on 2/20, and starting 2/22 Lasix dose lowered to 20 mg BID IV - weight trending: 187 lbs 2/14 --> 179 --> 175 --> 168 --> 173 --> 169 --> 169 lbs this AM - due to hypernatremia, Lasix was discontinued. Can resume at lower dose.  A-fib with RVR - developed AM of 2/17  - changed albuterol to levalbuterol 2/17 - Patient has been on heparin and was bridged to Coumadin. But due to ileus warfarin was stopped. Can likely be restarted once he  has consistent oral intake. He was also given a dose of vitamin K on 2/23.   Acute encephalopathy, worse 2/20, from sepsis UTI E. Coli, COPD, acute systolic CHF  - Patient is improved  Intermediate probability of pulmonary emboli - On heparin intravenously. Was on Coumadin which has been stopped due to ileus. Consider resuming soon unless there is descalation of care.   CAD status post CABG/acute on chronic systolic heart failure - per cardiology, added metoprolol IV as needed  - on aspirin. This can be stopped once he is on  warfarin.  Acute on chronic kidney disease stage III - Creatinine improved 1.98 today. Resume low-dose Lasix.  Hypernatremia - likely pre renal from no oral intake and getting lasix - Improved today.   Leukocytosis Resolved  Tobacco abuse - nicotine patch   Anemia of chronic disease, thrombocytopenia - no signs of active bleeding, Hg relatively stable  - Plt count stable   Right obturator ring fracture/right sacral and left fractures/bilateral hip girdle muscular strain/Right hip pain - nondisplaced right-sided pelvic ring disruption  - nonweightbearing right lower extremity, okay to weightbearing per ortho  - should be a self-limited problem  - per ortho rec's, will need follow-up with Dr. August Saucer in 3 weeks for repeat radiographs to make sure everything is healing  Ear lobe laceration - Status post repair  Severe protein calorie malnutrition  - in the context of acute illness. Resume diet as soon as able.  DVT prophylaxis: on IV heparin Code Status: DNR Family Communication: plan of care discussed with son at bedside  Disposition Plan: Palliative team continues to follow. His long-term prognosis appears to be quite poor. Not quite ready for discharge.   IV access:  Peripheral IV  Procedures and diagnostic studies:    Dg Abd Acute [name]/chest  07/03/2014   Diffuse colonic dilation with air-fluid levels. There are no findings to suggest a small bowel obstruction. Findings likely due to a diffuse colonic adynamic ileus. Distal colonic obstruction is also possible. 2. No free air. 3. Lung findings consistent with congestive heart failure and bilateral pleural effusions. 4. No significant change in the appearance of the chest or abdomen when compared to the study dated 07/01/2014.     CXR 07/02/2014 Nasogastric tube tip in the stomach, with side-port in the EG junction. This should be advanced for optimal placement. Central and basilar airspace opacities without  significant interval change. There also are unchanged pleural effusions.   Dg Abd Acute [name]/chest 07/01/2014 Marked colonic distention which may reflect distal bowel obstruction or colonic ileus. No free air noted. Congestive heart failure and bilateral airspace opacities.   Nm Pulmonary Perf/Vent 06/26/2014 Intermediate probability for pulmonary embolism by PIOPED II. Bibasilar airspace disease and pleural effusions significantly limits the utility of V/Q scan.   CXR 06/26/2014 Slight improvement in the left lung consolidation. Persistent edema and right pleural effusion.   CXR2/14/2016 Bilateral mid to lower lung heterogeneous opacities, left greater than right, with associated moderate right and probable small left pleural effusions. Findings may represent pulmonary edema, infection or underlying atelectasis.   CXR 06/24/2014 Decreased small bilateral pleural effusions and right basilar atelectasis. Stable cardiomegaly.   Mr Hip Right Wo Contrast 06/24/2014 Nondisplaced RIGHT obturator ring fractures. Complementary nondisplaced RIGHT sacral ala fracture. Bilateral hip girdle muscular strain. Anasarca up. LEFT inguinal hernia containing a loop of sigmoid colon.   CXR 06/14/2014 Moderate right and small left pleural effusions, loculated left pleural effusion increased since 06/06/2014.   Dg Hip Unilat  06/23/2014 There is no acute bony abnormality of the right hip.   Ct Head Wo Contrast 06/23/2014 Atrophy and microvascular ischemic disease without acute intracranial process  Medical Consultants:  Cardiology - signed off Palliative medicine PCCM - signed off   Other Consultants:  PT  IAnti-Infectives:   Vancomycin 2/20 --> 2/24 Zosyn 2/20 --> 2/24 Ceftriaxone 2/24  Subjective: Patient feels better this morning. His son is at bedside. He is requesting ice chips. Denies any pain.   Objective: Filed Vitals:   07/04/14 2026 07/04/14 2052 07/05/14 0102 07/05/14  0607  BP:  121/64  107/51  Pulse:  106  104  Temp:  97.7 F (36.5 C)  97.6 F (36.4 C)  TempSrc:  Tympanic  Tympanic  Resp:  18  18  Height:      Weight:      SpO2: 95% 96% 93% 95%    Intake/Output Summary (Last 24 hours) at 07/05/14 0809 Last data filed at 07/04/14 1807  Gross per 24 hour  Intake   1200 ml  Output   1250 ml  Net    -50 ml    Exam:   General:  Pt is alert, follows commands appropriately, not in acute distress  Cardiovascular: Irregular rate and rhythm,  no rubs, no gallops  Respiratory: Clear to auscultation bilaterally, scattered rhonchi with mild bibasilar crackles   Abdomen: Soft, non tender, slightly distended, bowel sounds present, no guarding  Extremities: +1 bilateral LE pitting edema, pulses DP and PT palpable bilaterally  Neuro: Grossly nonfocal  Data Reviewed: Basic Metabolic Panel:  Recent Labs Lab 07/01/14 0545 07/02/14 0410 07/03/14 0411 07/04/14 0418 07/05/14 0417  NA 134* 139 148* 151* 145  K 4.8 4.7 4.2 4.1 3.3*  CL 96 100 106 110 105  CO2 26 27 32 31 32  GLUCOSE 125* 110* 106* 109* 105*  BUN 96* 119* 112* 109* 89*  CREATININE 2.63* 2.94* 2.43* 2.36* 1.98*  CALCIUM 8.3* 8.6 8.3* 8.3* 7.8*  MG  --  2.9* 3.1* 2.9* 2.7*  PHOS  --  6.0* 6.5* 4.5 2.9   CBC:  Recent Labs Lab 07/01/14 0545 07/02/14 0410 07/03/14 0411 07/04/14 0418 07/05/14 0417  WBC 9.5 6.7 5.0 5.8 5.8  HGB 10.9* 10.7* 10.9* 11.0* 10.1*  HCT 34.1* 34.1* 36.2* 36.2* 34.1*  MCV 80.8 81.8 83.4 84.0 83.8  PLT 123* 127* 128* 154 136*     Recent Results (from the past 240 hour(s))  Culture, blood (routine x 2)     Status: None   Collection Time: 06/26/14  8:20 AM  Result Value Ref Range Status   Specimen Description BLOOD LEFT HAND  Final   Special Requests BOTTLES DRAWN AEROBIC ONLY 3CC  Final   Culture   Final    NO GROWTH 5 DAYS Performed at [facility]    Report Status 07/02/2014 FINAL  Final  Culture, blood (routine x 2)     Status:  None   Collection Time: 06/26/14  8:23 AM  Result Value Ref Range Status   Specimen Description BLOOD LEFT ARM  Final   Special Requests BOTTLES DRAWN AEROBIC AND ANAEROBIC 10CC  Final   Culture   Final    NO GROWTH 5 DAYS Performed at [facility]    Report Status 07/02/2014 FINAL  Final  Culture, Urine     Status: None   Collection Time: 06/26/14  3:47 PM  Result Value Ref Range Status   Specimen Description URINE, CATHETERIZED  Final  Special Requests NONE  Final   Colony Count NO GROWTH Performed at [facility]   Final   Culture NO GROWTH Performed at [facility]   Final   Report Status 06/27/2014 FINAL  Final  Culture, blood (routine x 2)     Status: None (Preliminary result)   Collection Time: 07/02/14  9:15 AM  Result Value Ref Range Status   Specimen Description BLOOD RIGHT ARM  Final   Special Requests   Final    BOTTLES DRAWN AEROBIC AND ANAEROBIC 10CC BOTH BOTTLES   Culture   Final           BLOOD CULTURE RECEIVED NO GROWTH TO DATE CULTURE WILL BE HELD FOR 5 DAYS BEFORE ISSUING A FINAL NEGATIVE REPORT Performed at [facility]    Report Status PENDING  Incomplete  Culture, blood (routine x 2)     Status: None (Preliminary result)   Collection Time: 07/02/14  9:21 AM  Result Value Ref Range Status   Specimen Description BLOOD RIGHT ARM  Final   Special Requests   Final    BOTTLES DRAWN AEROBIC AND ANAEROBIC 10CC BOTH BOTTLES   Culture   Final           BLOOD CULTURE RECEIVED NO GROWTH TO DATE CULTURE WILL BE HELD FOR 5 DAYS BEFORE ISSUING A FINAL NEGATIVE REPORT Performed at [facility]    Report Status PENDING  Incomplete  Culture, Urine     Status: None   Collection Time: 07/02/14 10:04 AM  Result Value Ref Range Status   Specimen Description URINE, CATHETERIZED  Final   Special Requests NONE  Final   Colony Count   Final    >=100,000 COLONIES/ML Performed at [facility]    Culture   Final     ESCHERICHIA COLI Performed at [facility]    Report Status 07/04/2014 FINAL  Final   Organism ID, Bacteria ESCHERICHIA COLI  Final      Susceptibility   Escherichia coli - MIC*    AMPICILLIN RESISTANT      CEFAZOLIN <=4 SENSITIVE Sensitive     CEFTRIAXONE <=1 SENSITIVE Sensitive     CIPROFLOXACIN >=4 RESISTANT Resistant     GENTAMICIN >=16 RESISTANT Resistant     LEVOFLOXACIN >=8 RESISTANT Resistant     NITROFURANTOIN <=16 SENSITIVE Sensitive     TOBRAMYCIN 8 INTERMEDIATE Intermediate     TRIMETH/SULFA >=320 RESISTANT Resistant     PIP/TAZO <=4 SENSITIVE Sensitive     * ESCHERICHIA COLI     Scheduled Meds: . antiseptic oral rinse  7 mL Mouth Rinse q12n4p  . aspirin  81 mg Oral Daily  . cefTRIAXone (ROCEPHIN)  IV  1 g Intravenous Q24H  . chlorhexidine  15 mL Mouth Rinse BID  . docusate sodium  100 mg Oral BID  . ipratropium  0.5 mg Nebulization Q6H  . levalbuterol  1.25 mg Nebulization 4 times per day  . nicotine  21 mg Transdermal Daily  . pantoprazole (PROTONIX) IV  40 mg Intravenous Q24H  . polyethylene glycol  17 g Oral Daily  . potassium chloride  20 mEq Oral BID  . sennosides  5 mL Oral BID  . sodium chloride  3 mL Intravenous Q12H   Continuous Infusions: . heparin     Mattix Imhof MD  Pager [phone]  If 7PM-7AM, please contact night-coverage [email or web address] Password [email or web address] 07/05/2014, 8:09 AM   LOS: 11 days

## 2014-07-06 DIAGNOSIS — R531 Weakness: Secondary | ICD-10-CM

## 2014-07-06 DIAGNOSIS — Z66 Do not resuscitate: Secondary | ICD-10-CM

## 2014-07-06 LAB — CBC
HCT: 33.3 % — ABNORMAL LOW (ref 39.0–52.0)
HEMOGLOBIN: 10 g/dL — AB (ref 13.0–17.0)
MCH: 25.3 pg — AB (ref 26.0–34.0)
MCHC: 30 g/dL (ref 30.0–36.0)
MCV: 84.1 fL (ref 78.0–100.0)
Platelets: 138 10*3/uL — ABNORMAL LOW (ref 150–400)
RBC: 3.96 MIL/uL — AB (ref 4.22–5.81)
RDW: 17 % — ABNORMAL HIGH (ref 11.5–15.5)
WBC: 5.8 10*3/uL (ref 4.0–10.5)

## 2014-07-06 LAB — BASIC METABOLIC PANEL
Anion gap: 6 (ref 5–15)
BUN: 64 mg/dL — AB (ref 6–23)
CO2: 28 mmol/L (ref 19–32)
CREATININE: 1.69 mg/dL — AB (ref 0.50–1.35)
Calcium: 7.6 mg/dL — ABNORMAL LOW (ref 8.4–10.5)
Chloride: 107 mmol/L (ref 96–112)
GFR calc non Af Amer: 38 mL/min — ABNORMAL LOW (ref >90)
GFR, EST AFRICAN AMERICAN: 44 mL/min — AB (ref >90)
Glucose, Bld: 144 mg/dL — ABNORMAL HIGH (ref 70–99)
Potassium: 3 mmol/L — ABNORMAL LOW (ref 3.5–5.1)
Sodium: 141 mmol/L (ref 135–145)

## 2014-07-06 LAB — HEPARIN LEVEL (UNFRACTIONATED): HEPARIN UNFRACTIONATED: 0.24 [IU]/mL — AB (ref 0.30–0.70)

## 2014-07-06 LAB — PROTIME-INR
INR: 1.43 (ref 0.00–1.49)
Prothrombin Time: 17.6 seconds — ABNORMAL HIGH (ref 11.6–15.2)

## 2014-07-06 MED ORDER — CEPHALEXIN 250 MG PO CAPS
250.0000 mg | ORAL_CAPSULE | Freq: Three times a day (TID) | ORAL | Status: DC
  Administered 2014-07-06 – 2014-07-10 (×13): 250 mg via ORAL
  Filled 2014-07-06 (×15): qty 1

## 2014-07-06 MED ORDER — POTASSIUM CHLORIDE CRYS ER 20 MEQ PO TBCR
40.0000 meq | EXTENDED_RELEASE_TABLET | Freq: Once | ORAL | Status: AC
  Administered 2014-07-06: 40 meq via ORAL

## 2014-07-06 MED ORDER — WARFARIN - PHARMACIST DOSING INPATIENT
Freq: Every day | Status: DC
  Administered 2014-07-06: 18:00:00

## 2014-07-06 MED ORDER — WARFARIN SODIUM 4 MG PO TABS
4.0000 mg | ORAL_TABLET | Freq: Once | ORAL | Status: AC
  Administered 2014-07-06: 4 mg via ORAL
  Filled 2014-07-06: qty 1

## 2014-07-06 MED ORDER — HEPARIN (PORCINE) IN NACL 100-0.45 UNIT/ML-% IJ SOLN
2000.0000 [IU]/h | INTRAMUSCULAR | Status: DC
  Administered 2014-07-06: 2000 [IU]/h via INTRAVENOUS
  Filled 2014-07-06 (×2): qty 250

## 2014-07-06 MED ORDER — ENOXAPARIN SODIUM 120 MG/0.8ML ~~LOC~~ SOLN
1.5000 mg/kg | SUBCUTANEOUS | Status: DC
  Administered 2014-07-06 – 2014-07-10 (×5): 115 mg via SUBCUTANEOUS
  Filled 2014-07-06 (×5): qty 0.8

## 2014-07-06 MED ORDER — IPRATROPIUM BROMIDE 0.02 % IN SOLN
0.5000 mg | Freq: Four times a day (QID) | RESPIRATORY_TRACT | Status: DC
  Administered 2014-07-06 (×3): 0.5 mg via RESPIRATORY_TRACT
  Filled 2014-07-06 (×3): qty 2.5

## 2014-07-06 MED ORDER — LEVALBUTEROL HCL 1.25 MG/0.5ML IN NEBU
1.2500 mg | INHALATION_SOLUTION | Freq: Four times a day (QID) | RESPIRATORY_TRACT | Status: DC
  Administered 2014-07-06 (×3): 1.25 mg via RESPIRATORY_TRACT
  Filled 2014-07-06 (×5): qty 0.5

## 2014-07-06 MED ORDER — IPRATROPIUM BROMIDE 0.02 % IN SOLN: 0.5000 mg | RESPIRATORY_TRACT | Status: DC | PRN

## 2014-07-06 NOTE — Progress Notes (Signed)
ANTICOAGULATION CONSULT NOTE - F/u Consult  Pharmacy Consult for Heparin Indication: pulmonary embolus  No Known Allergies  Patient Measurements: Height: 6\' 1"  (185.4 cm) Weight: 169 lb 12.1 oz (77 kg) IBW/kg (Calculated) : 79.9 Heparin Dosing Weight: 77 kg (actual)  Vital Signs: Temp: 98.4 F (36.9 C) (02/24 2208) Temp Source: Oral (02/24 2208) BP: 131/59 mmHg (02/24 2208) Pulse Rate: 93 (02/24 2208)  Labs:  Recent Labs  07/04/14 0418 07/05/14 0417 07/05/14 1835 07/06/14 0406  HGB 11.0* 10.1*  --  10.0*  HCT 36.2* 34.1*  --  33.3*  PLT 154 136*  --  138*  LABPROT 44.4* 19.0*  --   --   INR 4.68* 1.58*  --   --   HEPARINUNFRC  --   --  0.11* 0.24*  CREATININE 2.36* 1.98*  --  1.69*    Estimated Creatinine Clearance: 41.1 mL/min (by C-G formula based on Cr of 1.69).   Medical History: Past Medical History  Diagnosis Date  . Aorto-iliac disease     a. s/p bilat with stent 2007 - followed by [facility].  . CAD (coronary artery disease)     a. 10/2011 s/p CABG x4 (LIMA-LAD, SVG-Diag, SVG-OM1 and SVG-PDA)  b. 03/2012 NSTEMI s/p LHC SVG-RCA and LIMA-LAD but total occlusion of SVG-OM as well as SVG-diag. RX therapy recommended   . Hypertension   . ED (erectile dysfunction)   . COPD, severe   . Hyperlipidemia   . Stroke   . GERD (gastroesophageal reflux disease)   . Gout   . Paroxysmal atrial flutter     ablation  . Chronic combined systolic and diastolic CHF (congestive heart failure)     a. Last echo 04/2014: EF 40-45%.  Marland Kitchen. PAF (paroxysmal atrial fibrillation)   . CKD (chronic kidney disease), stage III   . Tobacco abuse   . History of noncompliance with medical treatment   . Carotid artery disease     a. RCEA 2013 - followed by [facility].  . Anemia   . Lower GI bleed     a. LGIB 10/2012: Colonoscopy performed June 21 showed blood in the colon but no definite source identified, diverticular versus AVM suspected. Aspirin stopped.  . Mitral regurgitation   . Hypertensive  heart disease     Medications:  Scheduled:  . antiseptic oral rinse  7 mL Mouth Rinse q12n4p  . aspirin  81 mg Oral Daily  . cefTRIAXone (ROCEPHIN)  IV  1 g Intravenous Q24H  . chlorhexidine  15 mL Mouth Rinse BID  . docusate sodium  100 mg Oral BID  . furosemide  40 mg Oral Daily  . ipratropium  0.5 mg Nebulization Q6H WA  . levalbuterol  1.25 mg Nebulization Q6H WA  . nicotine  21 mg Transdermal Daily  . pantoprazole (PROTONIX) IV  40 mg Intravenous Q24H  . polyethylene glycol  17 g Oral Daily  . potassium chloride  20 mEq Oral BID  . sennosides  5 mL Oral BID  . sodium chloride  3 mL Intravenous Q12H   Infusions:  . heparin     PRN: sodium chloride, fentaNYL, ipratropium, levalbuterol, metoprolol, sodium chloride  Assessment: 74 yo male admitted 06/23/14 s/p fall. He was recently admitted 2/3-06/18/14 for an acute COPD exacerbation. On 2/14, he developed acute respiratory distress, pO2 below reportable range and elevated d-dimer. Per MD, plan for VQ scan, LE dopplers, and pharmacy consulted to dose IV heparin for 2/14, warfarin added 2/16. Warfarin and heparin were stopped 2/20  due to concerns for SBO and supratherapeutic INR. Per pharmacist discussion with MD, plan was to restart heparin when INR < 3.0.   INR this AM down 1.58 following vitamin K  on 2/23  Hgb stable at 10.1  Plts low but stable at 136k  No bleeding reported  Previous heparin levels and rates reviewed - rates ranged from 1300 units/hr to 1800 units/hr with levels ranging from 0.17 to 0.63 IU/ml  1st heparin level today since resuming heparin infusion = 0.11, SUBtherapeutic on 1500 units/hr  No line issues/complications per nursing  Today, 2/25  0400 HL= 0.24 , no problems per RN, Plts=138, Hgb=10 (stable)  Goal of Therapy:  Heparin level 0.3-0.7 units/ml Monitor platelets by anticoagulation protocol: Yes   Plan:   Increase heparin infusion to 2000 units/hr.  Check heparin level in 8  hours.  Daily heparin level and CBC.  Monitor for signs/symptoms of bleeding.    Lorenza Evangelist 07/05/2014 7:38 PM

## 2014-07-06 NOTE — Progress Notes (Signed)
ANTICOAGULATION CONSULT NOTE   Pharmacy Consult for Heparin ==> Lovenox/Warfarin Indication: pulmonary embolus  No Known Allergies  Patient Measurements: Height:  (185.4 cm) Weight: 169 lb 12.1 oz (77 kg) IBW/kg (Calculated) : 79.9  Vital Signs: Temp: 97.7 F (36.5 C) (02/25 0523) Temp Source: Oral (02/25 0523) BP: 127/61 mmHg (02/25 0523) Pulse Rate: 106 (02/25 0523)  Labs:  Recent Labs  07/04/14 0418 07/05/14 0417 07/05/14 1835 07/06/14 0406  HGB 11.0* 10.1*  --  10.0*  HCT 36.2* 34.1*  --  33.3*  PLT 154 136*  --  138*  LABPROT 44.4* 19.0*  --  17.6*  INR 4.68* 1.58*  --  1.43  HEPARINUNFRC  --   --  0.11* 0.24*  CREATININE 2.36* 1.98*  --  1.69*    Estimated Creatinine Clearance: 41.1 mL/min (by C-G formula based on Cr of 1.69).   Medical History: Past Medical History  Diagnosis Date  . Aorto-iliac disease     a. s/p bilat with stent 2007 - followed by [facility].  . CAD (coronary artery disease)     a. 10/2011 s/p CABG x4 (LIMA-LAD, SVG-Diag, SVG-OM1 and SVG-PDA)  b. 03/2012 NSTEMI s/p LHC SVG-RCA and LIMA-LAD but total occlusion of SVG-OM as well as SVG-diag. RX therapy recommended   . Hypertension   . ED (erectile dysfunction)   . COPD, severe   . Hyperlipidemia   . Stroke   . GERD (gastroesophageal reflux disease)   . Gout   . Paroxysmal atrial flutter     ablation  . Chronic combined systolic and diastolic CHF (congestive heart failure)     a. Last echo 04/2014: EF 40-45%.  Marland Kitchen PAF (paroxysmal atrial fibrillation)   . CKD (chronic kidney disease), stage III   . Tobacco abuse   . History of noncompliance with medical treatment   . Carotid artery disease     a. RCEA 2013 - followed by [facility].  . Anemia   . Lower GI bleed     a. LGIB 10/2012: Colonoscopy performed June 21 showed blood in the colon but no definite source identified, diverticular versus AVM suspected. Aspirin stopped.  . Mitral regurgitation   . Hypertensive heart disease      Medications:  Scheduled:  . antiseptic oral rinse  7 mL Mouth Rinse q12n4p  . aspirin  81 mg Oral Daily  . cephALEXin  250 mg Oral 3 times per day  . chlorhexidine  15 mL Mouth Rinse BID  . docusate sodium  100 mg Oral BID  . furosemide  40 mg Oral Daily  . ipratropium  0.5 mg Nebulization Q6H WA  . levalbuterol  1.25 mg Nebulization Q6H WA  . nicotine  21 mg Transdermal Daily  . pantoprazole (PROTONIX) IV  40 mg Intravenous Q24H  . polyethylene glycol  17 g Oral Daily  . potassium chloride  20 mEq Oral BID  . potassium chloride  40 mEq Oral Once  . sennosides  5 mL Oral BID  . sodium chloride  3 mL Intravenous Q12H   Infusions:  . heparin 2,000 Units/hr (07/06/14 0533)   PRN: sodium chloride, fentaNYL, ipratropium, levalbuterol, metoprolol, sodium chloride  Assessment: 74 yo male admitted 06/23/14 s/p fall. He was recently admitted 2/3-06/18/14 for an acute COPD exacerbation. On 2/14, he developed acute respiratory distress, pO2 below reportable range and elevated ddimer. Per MD, plan for VQ scan, LE dopplers, and Pharmacy consulted to dose IV heparin for 2/14, warfarin added 2/16. Warfarin and heparin were stopped  2/20 due to concerns for SBO and supratherapeutic INR. Per pharmacist discussion with MD, plan  to restart heparin when INR < 3.0 which was done on 2/24.  Today, 07/06/2014, MD requested transition from IV heparin to SQ lovenox and PO warfarin  INR this AM down 1.43 following vitamin K 5mg  on 2/23  Hgb stable at 10, Plts low but stable at 138k  No bleeding reported  AoCKD state III - SCr decreased to 1.69 today, CrCl 41 ml/min  Previous warfarin dosing and INRs reviewed - INR because supratherapeutic very quickly after only three doses:  INR 1.32 on 2/13 - 5mg  warfarin given on 2/16  INR 1.05 on 2/17 - 6mg  warfarin given on 2/17  INR 1.59 on 2/18 - 4 mg warfarin given on 2/18  INR 2.95 on 2/19 - warfarin held  INR 5.33 on 2/20 - warfarin dc'd  INR 5.73 on  2/21  INR 4.86 on 2/22  INR 4.68 on 2/23 - vitamin K given  INR 1.58 on 2/24 - IV heparin started  Goal of Therapy:  Heparin level 0.3-0.7 units/ml Monitor platelets by anticoagulation protocol: Yes   Plan:   One hour after heparin stops, begin lovenox 1.5 mg/kg SQ q24h  Start low-dose warfarin 4mg  today - do not want to overshoot, but INR will show some resistance after vitamin K administration  Daily PT/INR  Per CHEST Guidelines, patient should receive a 5 day overlap of lovenox and warfarin therapy AND have an INR > or = 2.0 for 24 hours before stopping lovenox.  Loralee PacasErin Terrin Meddaugh, PharmD, BCPS Pager: [phone] 07/06/2014,11:11 AM

## 2014-07-06 NOTE — Consult Note (Signed)
WOC wound consult note Reason for Consult:sDTI of sacrum.  Patient with hip fracture, fracture of pelvis.  Prefers to be in a supine position and does not initiate repositioning maneuvers. Bilateral heels are intact, erythematous and blanchable. Patient has an ileus and is stooling 3-4 times each day (loose BM) making topical dressings difficult. Wound type:Pressure Pressure Ulcer POA: Yes Measurement:10cm x 9cm area of involvement with epidermis of skin peeling to reveal an open area measuring 4cm x 3cm with a dark 2cm x 1cm center. I anticipate this area will decline and result in a full thickness tissue injury over time despite interventions at this time as the damage has already been done to the underlying tissues. Wound bed:As described above. Drainage (amount, consistency, odor) scanm, serous Periwound:intact.  Dry. Dressing procedure/placement/frequency:I will place a mattress replacement with low air loss feature and Prevalon Boots bilaterally to protect the heels.  We will forego a dressing to the sacrum at this time due to the fecal incontinence, but provide timely care to the affected aera using our house, clear zinc oxide barrier ointment. WOC nursing team will not follow, but will remain available to this patient, the nursing and medical team.  Please re-consult if needed, for worsening of affected area or for failure to improve despite improvement in patient overall status. Thanks, Ladona MowLaurie Rainbow Salman, MSN, RN, GNP, [place], CWON-AP [phone])

## 2014-07-06 NOTE — Progress Notes (Signed)
CSW continuing to follow.  CSW received notification from PMT NP, Wadie Lessen that she met with family this morning and pt family wishes to continue aggressive treatment and for pt to go to short term rehab upon discharge.   CSW met with pt and pt son at bedside. CSW discussed that CSW received notification that plan for disposition is rehab at [facility]. Pt and pt son confirmed plan. CSW discussed [facility] and Pt son stated that he was unsure if [facility] was final decision for placement, but that decision would be up to pt wife, Juliann Pulse.   CSW contacted pt wife, Juliann Pulse via telephone with pt permission. CSW introduced self. CSW provided support to pt wife as she discussed that she felt "on edge" and has been overwhelmed by everything going on. CSW discussed with pt wife that pt not yet medically ready for discharge, but CSW wanted to discuss disposition planning in order to prepare appropriately. CSW inquired with pt wife if she was still interested in [facility] as that decision had been made prior to pt having a medical set back. Pt wife discussed that she was unsure if [facility] had the care that pt needed. CSW provided education about skilled nursing facilities and discussed that [facility] has the skilled nursing and rehabilitation that pt requires at this time. Pt expressed understanding and agreeable with plans for [facility] when pt medically stable for discharge.  CSW contacted [facility] to notify. CSW discussed with [facility] that pt is progressing, but not yet medically ready for discharge. Per [facility], facility has spoken to [facility] care manager who stated that pt insurance authorization is good through Friday and during the weekend if pt medically stable for discharge. CSW will continue to send updated clinicals to [facility] in case [facility] request updated clinicals, but [facility] states that pt can admit any time during that time frame.   CSW  notified MD.   CSW to continue to follow to provide support and assist with pt discharge needs to [facility] when pt medically ready for discharge.   Alison Murray, MSW, [place] [facility] [phone]

## 2014-07-06 NOTE — Progress Notes (Signed)
Patient ID: Nathan [name], male   DOB: 03/10/1941, 74 y.o.   MRN: [id number]  [facility] PROGRESS NOTE  Nathan [name] [id number] DOB: 08/28/1940 DOA: 06/23/2014 PCP: Lillia MountainGRIFFIN,JOHN JOSEPH, MD   Brief narrative:    74 y.o. male with extensive CAD, COPD with ongoing tobacco abuse, chronic CHF, HTN, peripheral vascular disease, GERD, CKD stage III, recently hospitalized from 06/14/2014 to 06/18/2014 for an acute COPD exacerbation, [facility] after an episode of fall that occurred several hours prior to this admission while he was trying to go to the bathroom. He stood up and his right leg gave away and he fell on his right side, subsequently unable to stand up and bear weight.   Major events since admission: 2/13 - MRI right hip with non displaced ring obturator fracture, conservative mngt per ortho  2/14 - dyspnea, lasix started as CXR with more vascular congestion, transferred to SDU  2/15 - VQ scan with intermediate prob PE, heparin drip started with Coumadin bridging  2/17 - developed sudden onset a-fib  2/20 - lethargy, hypoxia, oxygen sat 91% on 5 L, abd distension, ? SBO, transfer to ICU, PCCM consulted, ABX XRAY pending  2/21 - still lethargic, currently on BIPAP, started Zosyn and Vanc, overnight as pct > 10, palliative care team consulted  2/22 - transferred to medical unit, stable and doing better, off BiPAP, abd XRAY with persistent ileus, pt refusing NGT 2/23 - no BM since 2/20, pt asking to eat but aware of ileus, abd more distended this AM, still refusing NGT  2/24 - ileus has improved. Started on diet. He is tolerating. Oxygen requirements improved as well.  Assessment/Plan:   Acute respiratory failure with hypoxia - This is multifactorial and secondary to acute on chronic systolic CHF, PE, COPD (GOLD stage III) - was initially getting better but please note the events course since 2/20 when pt developed respiratory distress again -  Patient has made a significant improvement. He is now only on nasal cannula.  - will also continue bronchodilators scheduled and as needed (levalbuterol and ipratropium per PCCM) - Anticoagulation has been resumed.   Colonic ileus with Abdominal distention - Patient continues to have bowel movements. He is tolerating his liquid diet. Abdomen is benign. We'll advance to soft diet.  - placed on bowel regimen and avoiding narcotics as possible  Sepsis, severe, 2/20, E. Coli UTI - Initially HR in 110 - 120's with RR in 30's, pct > 10 and 23, zosyn and vanc started 2/20 - please note that blood cultures from 2/15 and again from 2/21 so far negative to date  - urine culture report with E. Coli. Since he is tolerating orally we will put him on Keflex.   Acute on chronic systolic heart failure - 2 D ECHO this admission with EF 40% - was initially on Lasix 60 mg IV BID - transitioned to oral Lasix 60 mg BID 2/17 and weight continued trending down - has gotten Lasix 40 mg IV x 2 doses on 2/20, and starting 2/22 Lasix dose lowered to 20 mg BID IV - weight trending: 187 lbs 2/14 --> 179 --> 175 --> 168 --> 173 --> 169 --> 169 lbs this AM - due to hypernatremia, Lasix was discontinued. This was resumed at lower dose. Replace potassium  Intermediate probability of pulmonary emboli - He's been changed to Lovenox and warfarin.   A-fib with RVR - developed AM of 2/17  - changed albuterol to levalbuterol  2/17 - On warfarin  Acute encephalopathy, worse 2/20, from sepsis UTI E. Coli, COPD, acute systolic CHF  - Patient is improved and close to baseline  CAD status post CABG/acute on chronic systolic heart failure - per cardiology, added metoprolol IV as needed  - on aspirin. This can be stopped once he is on warfarin.  Acute on chronic kidney disease stage III - Creatinine continues to improve.  Hypernatremia This was likely due to Lasix. Now improved.  Leukocytosis Resolved  Tobacco  abuse - nicotine patch   Anemia of chronic disease, thrombocytopenia - no signs of active bleeding, Hg relatively stable  - Plt count stable   Right obturator ring fracture/right sacral and left fractures/bilateral hip girdle muscular strain/Right hip pain - nondisplaced right-sided pelvic ring disruption  - nonweightbearing right lower extremity, okay to weightbearing per ortho  - should be a self-limited problem  - per ortho rec's, will need follow-up with Dr. August Saucer in 3 weeks for repeat radiographs to make sure everything is healing  Ear lobe laceration - Status post repair  Severe protein calorie malnutrition  - in the context of acute illness. Resume diet as soon as able.  Skin breakdown sacral area Wound care consult. Leave Foley in for now.  DVT prophylaxis: on IV heparin Code Status: DNR Family Communication: plan of care discussed with patient and son at bedside. Discussed with his wife over the phone. Disposition Plan: Palliative team continues to follow. Patient has made significant improvement. Agreeable to SNF. Possible discharge in 1-2 days.   IV access:  Peripheral IV  Procedures and diagnostic studies:    Dg Abd Acute W/chest  07/03/2014   Diffuse colonic dilation with air-fluid levels. There are no findings to suggest a small bowel obstruction. Findings likely due to a diffuse colonic adynamic ileus. Distal colonic obstruction is also possible. 2. No free air. 3. Lung findings consistent with congestive heart failure and bilateral pleural effusions. 4. No significant change in the appearance of the chest or abdomen when compared to the study dated 07/01/2014.     CXR 07/02/2014 Nasogastric tube tip in the stomach, with side-port in the EG junction. This should be advanced for optimal placement. Central and basilar airspace opacities without significant interval change. There also are unchanged pleural effusions.   Dg Abd Acute W/chest 07/01/2014 Marked  colonic distention which may reflect distal bowel obstruction or colonic ileus. No free air noted. Congestive heart failure and bilateral airspace opacities.   Nm Pulmonary Perf/Vent 06/26/2014 Intermediate probability for pulmonary embolism by PIOPED II. Bibasilar airspace disease and pleural effusions significantly limits the utility of V/Q scan.   CXR 06/26/2014 Slight improvement in the left lung consolidation. Persistent edema and right pleural effusion.   CXR2/14/2016 Bilateral mid to lower lung heterogeneous opacities, left greater than right, with associated moderate right and probable small left pleural effusions. Findings may represent pulmonary edema, infection or underlying atelectasis.   CXR 06/24/2014 Decreased small bilateral pleural effusions and right basilar atelectasis. Stable cardiomegaly.   Mr Hip Right Wo Contrast 06/24/2014 Nondisplaced RIGHT obturator ring fractures. Complementary nondisplaced RIGHT sacral ala fracture. Bilateral hip girdle muscular strain. Anasarca up. LEFT inguinal hernia containing a loop of sigmoid colon.   CXR 06/14/2014 Moderate right and small left pleural effusions, loculated left pleural effusion increased since 06/06/2014.   Dg Hip Unilat 06/23/2014 There is no acute bony abnormality of the right hip.   Ct Head Wo Contrast 06/23/2014 Atrophy and microvascular ischemic disease without acute intracranial process  Medical Consultants:  Cardiology - signed off Palliative medicine PCCM - signed off   Other Consultants:  PT  IAnti-Infectives:   Vancomycin 2/20 --> 2/24 Zosyn 2/20 --> 2/24 Ceftriaxone 2/24--> 2/25 Keflex 2/25  Subjective: Patient continues to feel better. He wants solid foods. Denies any nausea, vomiting. Having bowel movements.   Objective: Filed Vitals:   07/05/14 1905 07/05/14 2208 07/06/14 0523 07/06/14 0752  BP:  131/59 127/61   Pulse:  93 106   Temp:  98.4 F (36.9 C) 97.7 F (36.5 C)    TempSrc:  Oral Oral   Resp:  16 16   Height:      Weight:      SpO2: 96% 99% 98% 100%    Intake/Output Summary (Last 24 hours) at 07/06/14 1106 Last data filed at 07/06/14 1000  Gross per 24 hour  Intake   1018 ml  Output   2200 ml  Net  -1182 ml    Exam:   General:  Pt is alert, in no acute distress  Cardiovascular: Irregular rate and rhythm,  no rubs, no gallops  Respiratory: Clear to auscultation bilaterally, much improved air entry bilaterally. Few crackles at the bases.   Abdomen: Soft, non tender, slightly distended, bowel sounds present, no guarding  Neuro: Grossly nonfocal  Data Reviewed: Basic Metabolic Panel:  Recent Labs Lab 07/02/14 0410 07/03/14 0411 07/04/14 0418 07/05/14 0417 07/06/14 0406  NA 139 148* 151* 145 141  K 4.7 4.2 4.1 3.3* 3.0*  CL 100 106 110 105 107  CO2 27 32 31 32 28  GLUCOSE 110* 106* 109* 105* 144*  BUN 119* 112* 109* 89* 64*  CREATININE 2.94* 2.43* 2.36* 1.98* 1.69*  CALCIUM 8.6 8.3* 8.3* 7.8* 7.6*  MG 2.9* 3.1* 2.9* 2.7*  --   PHOS 6.0* 6.5* 4.5 2.9  --    CBC:  Recent Labs Lab 07/02/14 0410 07/03/14 0411 07/04/14 0418 07/05/14 0417 07/06/14 0406  WBC 6.7 5.0 5.8 5.8 5.8  HGB 10.7* 10.9* 11.0* 10.1* 10.0*  HCT 34.1* 36.2* 36.2* 34.1* 33.3*  MCV 81.8 83.4 84.0 83.8 84.1  PLT 127* 128* 154 136* 138*     Recent Results (from the past 240 hour(s))  Culture, Urine     Status: None   Collection Time: 06/26/14  3:47 PM  Result Value Ref Range Status   Specimen Description URINE, CATHETERIZED  Final   Special Requests NONE  Final   Colony Count NO GROWTH Performed at [facility]   Final   Culture NO GROWTH Performed at [facility]   Final   Report Status 06/27/2014 FINAL  Final  Culture, blood (routine x 2)     Status: None (Preliminary result)   Collection Time: 07/02/14  9:15 AM  Result Value Ref Range Status   Specimen Description BLOOD RIGHT ARM  Final   Special Requests   Final     BOTTLES DRAWN AEROBIC AND ANAEROBIC 10CC BOTH BOTTLES   Culture   Final           BLOOD CULTURE RECEIVED NO GROWTH TO DATE CULTURE WILL BE HELD FOR 5 DAYS BEFORE ISSUING A FINAL NEGATIVE REPORT Performed at [facility]    Report Status PENDING  Incomplete  Culture, blood (routine x 2)     Status: None (Preliminary result)   Collection Time: 07/02/14  9:21 AM  Result Value Ref Range Status   Specimen Description BLOOD RIGHT ARM  Final   Special Requests  Final    BOTTLES DRAWN AEROBIC AND ANAEROBIC 10CC BOTH BOTTLES   Culture   Final           BLOOD CULTURE RECEIVED NO GROWTH TO DATE CULTURE WILL BE HELD FOR 5 DAYS BEFORE ISSUING A FINAL NEGATIVE REPORT Performed at [facility]    Report Status PENDING  Incomplete  Culture, Urine     Status: None   Collection Time: 07/02/14 10:04 AM  Result Value Ref Range Status   Specimen Description URINE, CATHETERIZED  Final   Special Requests NONE  Final   Colony Count   Final    >=100,000 COLONIES/ML Performed at [facility]    Culture   Final    ESCHERICHIA COLI Performed at [facility]    Report Status 07/04/2014 FINAL  Final   Organism ID, Bacteria ESCHERICHIA COLI  Final      Susceptibility   Escherichia coli - MIC*    AMPICILLIN RESISTANT      CEFAZOLIN <=4 SENSITIVE Sensitive     CEFTRIAXONE <=1 SENSITIVE Sensitive     CIPROFLOXACIN >=4 RESISTANT Resistant     GENTAMICIN >=16 RESISTANT Resistant     LEVOFLOXACIN >=8 RESISTANT Resistant     NITROFURANTOIN <=16 SENSITIVE Sensitive     TOBRAMYCIN 8 INTERMEDIATE Intermediate     TRIMETH/SULFA >=320 RESISTANT Resistant     PIP/TAZO <=4 SENSITIVE Sensitive     * ESCHERICHIA COLI     Scheduled Meds: . antiseptic oral rinse  7 mL Mouth Rinse q12n4p  . aspirin  81 mg Oral Daily  . cephALEXin  250 mg Oral 3 times per day  . chlorhexidine  15 mL Mouth Rinse BID  . docusate sodium  100 mg Oral BID  . furosemide  40 mg Oral Daily  . ipratropium   0.5 mg Nebulization Q6H WA  . levalbuterol  1.25 mg Nebulization Q6H WA  . nicotine  21 mg Transdermal Daily  . pantoprazole (PROTONIX) IV  40 mg Intravenous Q24H  . polyethylene glycol  17 g Oral Daily  . potassium chloride  20 mEq Oral BID  . potassium chloride  40 mEq Oral Once  . sennosides  5 mL Oral BID  . sodium chloride  3 mL Intravenous Q12H   Continuous Infusions: . heparin 2,000 Units/hr (07/06/14 0533)   Osvaldo Shipper MD  Pager [phone]  If 7PM-7AM, please contact night-coverage [email or web address] Password [facility]  07/06/2014 2:07 PM  LOS: 12 days

## 2014-07-06 NOTE — Progress Notes (Signed)
Progress Note from the Palliative Medicine Team at [facility]  Subjective:  -son and wife at bedside  -a detailed discussion  regarding diagnosis, prognosis,  GOC, treatment options and disposition was had.  Questions and concerns addressed  -discussed natural trajectory in a patient with multiple co-morbidities and over all failure to thrive was had.   Family strongly encouraged to continue to evaluate the situation, consider the limitations of medicine within the context of mortality and patient's values and GOC over the next weeks and months and make decisions accordingly  -MOST form was introduced   Objective: No Known Allergies Scheduled Meds: . antiseptic oral rinse  7 mL Mouth Rinse q12n4p  . aspirin  81 mg Oral Daily  . cefTRIAXone (ROCEPHIN)  IV  1 g Intravenous Q24H  . chlorhexidine  15 mL Mouth Rinse BID  . docusate sodium  100 mg Oral BID  . furosemide  40 mg Oral Daily  . ipratropium  0.5 mg Nebulization Q6H WA  . levalbuterol  1.25 mg Nebulization Q6H WA  . nicotine  21 mg Transdermal Daily  . pantoprazole (PROTONIX) IV  40 mg Intravenous Q24H  . polyethylene glycol  17 g Oral Daily  . potassium chloride  20 mEq Oral BID  . sennosides  5 mL Oral BID  . sodium chloride  3 mL Intravenous Q12H   Continuous Infusions: . heparin 2,000 Units/hr (07/06/14 0533)   PRN Meds:.sodium chloride, fentaNYL, ipratropium, levalbuterol, metoprolol, sodium chloride  BP 127/61 mmHg  Pulse 106  Temp(Src) 97.7 F (36.5 C) (Oral)  Resp 16  Ht  (1.854 m)  Wt 77 kg (169 lb 12.1 oz)  BMI 22.40 kg/m2  SpO2 100%   PPS: 30 %      Intake/Output Summary (Last 24 hours) at 07/06/14 0918 Last data filed at 07/06/14 0523  Gross per 24 hour  Intake   1255 ml  Output   2200 ml  Net   -945 ml      LBM:07-05-14      Physical Exam:  General: Chronically ill appearing HEENT:  Moist buccal membranes, no exudate, audible throat  secretions Chest:  Scattered coarse BS CVS:  tachycardic Abdomen: slightly distended, NT   Labs: CBC    Component Value Date/Time   WBC 5.8 07/06/2014 0406   RBC 3.96* 07/06/2014 0406   HGB 10.0* 07/06/2014 0406   HCT 33.3* 07/06/2014 0406   PLT 138* 07/06/2014 0406   MCV 84.1 07/06/2014 0406   MCH 25.3* 07/06/2014 0406   MCHC 30.0 07/06/2014 0406   RDW 17.0* 07/06/2014 0406   LYMPHSABS 0.5* 06/26/2014 0357   MONOABS 0.7 06/26/2014 0357   EOSABS 0.0 06/26/2014 0357   BASOSABS 0.0 06/26/2014 0357    BMET    Component Value Date/Time   NA 141 07/06/2014 0406   K 3.0* 07/06/2014 0406   CL 107 07/06/2014 0406   CO2 28 07/06/2014 0406   GLUCOSE 144* 07/06/2014 0406   BUN 64* 07/06/2014 0406   CREATININE 1.69* 07/06/2014 0406   CREATININE 1.58* 09/18/2011 1224   CALCIUM 7.6* 07/06/2014 0406   GFRNONAA 38* 07/06/2014 0406   GFRAA 44* 07/06/2014 0406    CMP     Component Value Date/Time   NA 141 07/06/2014 0406   K 3.0* 07/06/2014 0406   CL 107 07/06/2014 0406   CO2 28 07/06/2014 0406   GLUCOSE 144* 07/06/2014 0406   BUN 64* 07/06/2014 0406   CREATININE 1.69* 07/06/2014 0406   CREATININE 1.58*  09/18/2011 1224   CALCIUM 7.6* 07/06/2014 0406   PROT 5.5* 06/23/2014 1413   ALBUMIN 3.0* 06/23/2014 1413   AST 28 06/23/2014 1413   ALT 76* 06/23/2014 1413   ALKPHOS 53 06/23/2014 1413   BILITOT 0.7 06/23/2014 1413   GFRNONAA 38* 07/06/2014 0406   GFRAA 44* 07/06/2014 0406     Assessment and Plan: 1. Code Status:DNR/DNI 2. Symptom Control: weakness            -PT/OT as tolerated 3. Psycho/Social:  Emotional support to patient and family at bedsdie 4. Spiritual  Chaplin consult decliened 5. Disposition:  SNF for rehabilitation  Patient Documents Completed or Given: Document Given Completed  Advanced Directives Pkt    MOST X   DNR    Gone from My Sight    Hard Choices X     Time In Time Out Total Time Spent with Patient Total Overall Time  1000 1100 60 min 60 min    Greater than 50%  of this time  was spent counseling and coordinating care related to the above assessment and plan. PMT will continue to support holistically   Lorinda CreedMary Larach NP  Palliative Medicine Team Team Phone # [phone] Pager [phone]  Discussed with Dr Rito EhrlichKrishnan 1

## 2014-07-06 NOTE — Progress Notes (Signed)
Physical Therapy Treatment Patient Details Name: Allen KellMaurice W Callies MRN: [id number] DOB: 05/13/1940 Today's Date: 07/06/2014    History of Present Illness 74 y.o. male with extensive CAD, COPD with ongoing tobacco abuse, chronic CHF, HTN, peripheral vascular disease, GERD, CKD stage III, recently hospitalized from 06/14/2014 to 06/18/2014 for an acute COPD exacerbation and now admitted after fall at home and sustained nondisplaced right-sided pelvic ring disruption now nonweightbearing right lower extremity and touch touchdown weightbearing left lower extremity. VQ scan with intermediate prob PE     PT Comments    Pt assisted with performing LE exercises in supine and then assist to sitting EOB today.  Continue to recommend SNF upon d/c.  Follow Up Recommendations  SNF;Supervision/Assistance - 24 hour     Equipment Recommendations  None recommended by PT    Recommendations for Other Services       Precautions / Restrictions Precautions Precautions: Fall Precaution Comments: monitor sats Restrictions Weight Bearing Restrictions: Yes RLE Weight Bearing: Non weight bearing LLE Weight Bearing: Non weight bearing    Mobility  Bed Mobility Overal bed mobility: Needs Assistance Bed Mobility: Supine to Sit;Sit to Supine     Supine to sit: Total assist;HOB elevated Sit to supine: Total assist   General bed mobility comments: verbal cues for technique, used bed positioning to assist  Transfers                    Ambulation/Gait                 Stairs            Wheelchair Mobility    Modified Rankin (Stroke Patients Only)       Balance Overall balance assessment: Needs assistance Sitting-balance support: Bilateral upper extremity supported;Feet supported Sitting balance-Leahy Scale: Fair Sitting balance - Comments: pt able to sit min/guard EOB, cues for improving posture (rounded shoulder, forward head posture), only able to tolerate 2-3 minutes  sitting reports dizziness and slight dyspnea                            Cognition Arousal/Alertness: Awake/alert Behavior During Therapy: WFL for tasks assessed/performed Overall Cognitive Status: Within Functional Limits for tasks assessed                      Exercises General Exercises - Lower Extremity Ankle Circles/Pumps: Both;10 reps;AROM Quad Sets: AROM;Both;10 reps Short Arc QuadBarbaraann Boys: AAROM;Both;10 reps Heel Slides: AAROM;Both;10 reps Hip ABduction/ADduction: AAROM;Both;10 reps    General Comments        Pertinent Vitals/Pain Pain Assessment: 0-10 Pain Score: 5  Pain Location: pelvis Pain Descriptors / Indicators: Sore Pain Intervention(s): Limited activity within patient's tolerance;Monitored during session;Repositioned    Home Living                      Prior Function            PT Goals (current goals can now be found in the care plan section) Progress towards PT goals: Progressing toward goals    Frequency  Min 2X/week    PT Plan Current plan remains appropriate    Co-evaluation             End of Session Equipment Utilized During Treatment: Oxygen Activity Tolerance: Patient tolerated treatment well Patient left: in bed;with call bell/phone within reach;with family/visitor present;with bed alarm set     Time: [id number] PT Time  Calculation (min) (ACUTE ONLY): 25 min  Charges:  $Therapeutic Exercise: 8-22 mins $Therapeutic Activity: 8-22 mins                    G Codes:      Khalifa Knecht,KATHrine E 09-Jul-2014, 3:14 PM Zenovia Jarred, PT, DPT Jul 09, 2014 Pager: [phone]

## 2014-07-07 ENCOUNTER — Inpatient Hospital Stay (HOSPITAL_COMMUNITY): Payer: Medicare PPO

## 2014-07-07 LAB — PROTIME-INR
INR: 1.39 (ref 0.00–1.49)
PROTHROMBIN TIME: 17.2 s — AB (ref 11.6–15.2)

## 2014-07-07 LAB — CBC
HCT: 31.9 % — ABNORMAL LOW (ref 39.0–52.0)
HEMOGLOBIN: 9.5 g/dL — AB (ref 13.0–17.0)
MCH: 25.1 pg — ABNORMAL LOW (ref 26.0–34.0)
MCHC: 29.8 g/dL — ABNORMAL LOW (ref 30.0–36.0)
MCV: 84.2 fL (ref 78.0–100.0)
Platelets: 163 10*3/uL (ref 150–400)
RBC: 3.79 MIL/uL — ABNORMAL LOW (ref 4.22–5.81)
RDW: 16.9 % — ABNORMAL HIGH (ref 11.5–15.5)
WBC: 6.5 10*3/uL (ref 4.0–10.5)

## 2014-07-07 LAB — CLOSTRIDIUM DIFFICILE BY PCR: Toxigenic C. Difficile by PCR: NEGATIVE

## 2014-07-07 MED ORDER — WARFARIN SODIUM 4 MG PO TABS
4.0000 mg | ORAL_TABLET | Freq: Once | ORAL | Status: AC
  Administered 2014-07-07: 4 mg via ORAL
  Filled 2014-07-07 (×2): qty 1

## 2014-07-07 MED ORDER — SACCHAROMYCES BOULARDII 250 MG PO CAPS
250.0000 mg | ORAL_CAPSULE | Freq: Two times a day (BID) | ORAL | Status: DC
  Administered 2014-07-07 – 2014-07-10 (×7): 250 mg via ORAL
  Filled 2014-07-07 (×8): qty 1

## 2014-07-07 MED ORDER — IPRATROPIUM BROMIDE 0.02 % IN SOLN
0.5000 mg | Freq: Four times a day (QID) | RESPIRATORY_TRACT | Status: DC
  Administered 2014-07-07 (×3): 0.5 mg via RESPIRATORY_TRACT
  Filled 2014-07-07 (×3): qty 2.5

## 2014-07-07 MED ORDER — LEVALBUTEROL HCL 1.25 MG/0.5ML IN NEBU
1.2500 mg | INHALATION_SOLUTION | Freq: Three times a day (TID) | RESPIRATORY_TRACT | Status: DC
  Administered 2014-07-08 – 2014-07-10 (×7): 1.25 mg via RESPIRATORY_TRACT
  Filled 2014-07-07 (×10): qty 0.5

## 2014-07-07 MED ORDER — LEVALBUTEROL HCL 1.25 MG/0.5ML IN NEBU
1.2500 mg | INHALATION_SOLUTION | Freq: Four times a day (QID) | RESPIRATORY_TRACT | Status: DC
  Administered 2014-07-07 (×3): 1.25 mg via RESPIRATORY_TRACT
  Filled 2014-07-07 (×5): qty 0.5

## 2014-07-07 MED ORDER — PANTOPRAZOLE SODIUM 40 MG PO TBEC
40.0000 mg | DELAYED_RELEASE_TABLET | Freq: Every day | ORAL | Status: DC
  Administered 2014-07-07 – 2014-07-10 (×4): 40 mg via ORAL
  Filled 2014-07-07 (×5): qty 1

## 2014-07-07 MED ORDER — ENSURE COMPLETE PO LIQD
237.0000 mL | Freq: Two times a day (BID) | ORAL | Status: DC
  Administered 2014-07-07 – 2014-07-08 (×3): 237 mL via ORAL

## 2014-07-07 MED ORDER — FUROSEMIDE 10 MG/ML IJ SOLN
40.0000 mg | Freq: Once | INTRAMUSCULAR | Status: AC
  Administered 2014-07-07: 40 mg via INTRAVENOUS
  Filled 2014-07-07: qty 4

## 2014-07-07 MED ORDER — IPRATROPIUM BROMIDE 0.02 % IN SOLN
0.5000 mg | Freq: Three times a day (TID) | RESPIRATORY_TRACT | Status: DC
Start: 2014-07-08 — End: 2014-07-10
  Administered 2014-07-08 – 2014-07-10 (×7): 0.5 mg via RESPIRATORY_TRACT
  Filled 2014-07-07 (×7): qty 2.5

## 2014-07-07 NOTE — Progress Notes (Signed)
ANTICOAGULATION CONSULT NOTE   Pharmacy Consult for Lovenox/Warfarin Indication: pulmonary embolus  No Known Allergies  Patient Measurements: Height: 6\' 1"  (185.4 cm) Weight: 169 lb 12.1 oz (77 kg) IBW/kg (Calculated) : 79.9  Vital Signs: Temp: 98.6 F (37 C) (02/26 0444) Temp Source: Oral (02/26 0444) BP: 117/66 mmHg (02/26 0444) Pulse Rate: 106 (02/26 0444)  Labs:  Recent Labs  07/05/14 0417 07/05/14 1835 07/06/14 0406 07/07/14 0521  HGB 10.1*  --  10.0* 9.5*  HCT 34.1*  --  33.3* 31.9*  PLT 136*  --  138* 163  LABPROT 19.0*  --  17.6* 17.2*  INR 1.58*  --  1.43 1.39  HEPARINUNFRC  --  0.11* 0.24*  --   CREATININE 1.98*  --  1.69*  --     Estimated Creatinine Clearance: 41.1 mL/min (by C-G formula based on Cr of 1.69).  Medications:  Scheduled:  . antiseptic oral rinse  7 mL Mouth Rinse q12n4p  . aspirin  81 mg Oral Daily  . cephALEXin  250 mg Oral 3 times per day  . chlorhexidine  15 mL Mouth Rinse BID  . docusate sodium  100 mg Oral BID  . enoxaparin (LOVENOX) injection  1.5 mg/kg Subcutaneous Q24H  . feeding supplement (ENSURE COMPLETE)  237 mL Oral BID BM  . furosemide  40 mg Oral Daily  . ipratropium  0.5 mg Nebulization Q6H WA  . levalbuterol  1.25 mg Nebulization Q6H WA  . nicotine  21 mg Transdermal Daily  . pantoprazole (PROTONIX) IV  40 mg Intravenous Q24H  . polyethylene glycol  17 g Oral Daily  . potassium chloride  20 mEq Oral BID  . sennosides  5 mL Oral BID  . sodium chloride  3 mL Intravenous Q12H  . Warfarin - Pharmacist Dosing Inpatient   Does not apply q1800   Infusions:    PRN: sodium chloride, fentaNYL, ipratropium, levalbuterol, metoprolol, sodium chloride  Assessment: 74 yo male admitted 06/23/14 s/p fall. He was recently admitted 2/3-06/18/14 for an acute COPD exacerbation. On 2/14, he developed acute respiratory distress, pO2 below reportable range and elevated ddimer. Per MD, plan for VQ scan, LE dopplers, and Pharmacy consulted  to dose IV heparin for 2/14, warfarin added 2/16. Warfarin and heparin were stopped 2/20 due to concerns for SBO and supratherapeutic INR. Per pharmacist discussion with MD, plan  to restart heparin when INR < 3.0 which was done on 2/24.  Previous warfarin dosing and INRs reviewed - INR because supratherapeutic very quickly after only three doses:  INR 1.32 on 2/13 - 5mg  warfarin given on 2/16  INR 1.05 on 2/17 - 6mg  warfarin given on 2/17  INR 1.59 on 2/18 - 4 mg warfarin given on 2/18  INR 2.95 on 2/19 - warfarin held  INR 5.33 on 2/20 - warfarin dc'd  INR 5.73 on 2/21  INR 4.86 on 2/22  INR 4.68 on 2/23 - vitamin K given  INR 1.58 on 2/24 - IV heparin started  INR 1.43 on 2/25 - transitioned to lovenox/warfarin  Today, 07/07/2014   Day #2 Lovenox/Warfarin overlap  INR this AM down 1.39 following vitamin K 5mg  on 2/23, warfarin 4mg  given last PM  Hgb slightly decreased to 9.5, Plts low but improved to 163k  No bleeding reported  AoCKD state III - SCr decreased to 1.69 yesterday, CrCl 41 ml/min  Receiving aspirin 81mg  daily  Soft diet and Ensure BID ordered - moderate intake recorded  Goal of Therapy:  Heparin level  0.3-0.7 units/ml Monitor platelets by anticoagulation protocol: Yes   Plan:   Repeat warfarin  PO x 1 today  Continue Lovenox 1.5 mg/kg SQ q24h  Stop aspirin now that on warfarin?  Daily PT/INR  CBC ordered daily per MD  Per CHEST Guidelines, patient should receive a 5 day overlap of lovenox and warfarin therapy AND have an INR > or = 2.0 for 24 hours before stopping lovenox.  Loralee Pacas, PharmD, BCPS Pager: [phone] 07/07/2014,10:12 AM

## 2014-07-07 NOTE — Progress Notes (Addendum)
Patient ID: Nathan [name], male   DOB: [birth year], 74 y.o.   MRN: [id number]  [facility] PROGRESS NOTE  Nathan [name] BJY:[id number] DOB: 1940-08-07 DOA: 06/23/2014 PCP: Nathan Mountain, MD   Brief narrative:    74 y.o. male with extensive CAD, COPD with ongoing tobacco abuse, chronic CHF, HTN, peripheral vascular disease, GERD, CKD stage III, recently hospitalized from 06/14/2014 to 06/18/2014 for an acute COPD exacerbation, presented back to [facility] ED on 2/12 after an episode of fall that occurred several hours prior to this admission while he was trying to go to the bathroom. He stood up and his right leg gave away and he fell on his right side, subsequently unable to stand up and bear weight.   Major events since admission: 2/13 - MRI right hip with non displaced ring obturator fracture, conservative mngt per ortho  2/14 - dyspnea, lasix started as CXR with more vascular congestion, transferred to SDU  2/15 - VQ scan with intermediate prob PE, heparin drip started with Coumadin bridging  2/17 - developed sudden onset a-fib  2/20 - lethargy, hypoxia, oxygen sat 91% on 5 L, abd distension, ? SBO, transfer to ICU, PCCM consulted, ABX XRAY pending  2/21 - still lethargic, currently on BIPAP, started Zosyn and Vanc, overnight as pct > 10, palliative care team consulted  2/22 - transferred to medical unit, stable and doing better, off BiPAP, abd XRAY with persistent ileus, pt refusing NGT 2/23 - no BM since 2/20, pt asking to eat but aware of ileus, abd more distended this AM, still refusing NGT  2/24 - ileus has improved. Started on diet. He is tolerating. Oxygen requirements improved as well.  Assessment/Plan:    Acute respiratory failure with hypoxia - This is multifactorial and secondary to acute on chronic systolic CHF, PE, COPD (GOLD stage III) - was initially getting better but 2/20 pt developed respiratory distress again - Patient has made a significant improvement.  He is now only on nasal cannula.  - will also continue bronchodilators scheduled and as needed (levalbuterol and ipratropium per PCCM) - Anticoagulation has been resumed. Oral Lasix has been resumed. Check renal function in the morning.  Colonic ileus with Abdominal distention - Patient continues to have bowel movements. C diff is pending. Most likely this is due to resolution of the ileus. Avoid anti-diarrheal agents. We will place him on probiotics. Continue current diet. - placed on bowel regimen and avoiding narcotics as possible  Sepsis, severe, 2/20, E. Coli UTI Resolved - Initially HR in 110 - 120's with RR in 30's, pct > 10 and 23, zosyn and vanc started 2/20 - please note that blood cultures from 2/15 and again from 2/21 so far negative to date  - urine culture report with E. Coli. Continue Keflex.   Acute on chronic systolic heart failure - 2 D ECHO this admission with EF 40% - He was initially given IV Lasix. His weight was trending down. He was diuresing quite well. Then due to hypernatremia Lasix was stopped. Sodium level improved. Lasix has been resumed. - Renal function in the morning  Intermediate probability of pulmonary emboli - He's been changed to Lovenox and warfarin. Await therapeutic INR  A-fib with RVR - developed AM of 2/17  - changed albuterol to levalbuterol 2/17 - On warfarin  Acute encephalopathy, worse 2/20, from sepsis UTI E. Coli, COPD, acute systolic CHF  - Patient is improved and close to baseline  CAD status post CABG/acute on chronic systolic heart  failure - per cardiology, added metoprolol IV as needed  - on aspirin. This can be stopped once he is on warfarin.  Acute on chronic kidney disease stage III - Creatinine continues to improve.  Hypernatremia This was likely due to Lasix. Now improved.  Leukocytosis Resolved  Tobacco abuse - nicotine patch   Anemia of chronic disease, thrombocytopenia - no signs of active bleeding, Hg  relatively stable  - Plt count stable   Right obturator ring fracture/right sacral and left fractures/bilateral hip girdle muscular strain/Right hip pain - nondisplaced right-sided pelvic ring disruption  - nonweightbearing right lower extremity, okay to weightbearing per ortho  - should be a self-limited problem  - per ortho rec's, will need follow-up with Dr. August Saucer in 3 weeks for repeat radiographs to make sure everything is healing  Ear lobe laceration - Status post repair  Severe protein calorie malnutrition  - in the context of acute illness. Now on diet but reports poor appetite. This will be very challenging to address. He does have numerous comorbidities. Consult dietitian.   Skin breakdown sacral area Wound care consult. Leave Foley in for now.  DVT prophylaxis: on IV heparin Code Status: DNR Family Communication: plan of care discussed with patient and son at bedside. Discussed with his wife over the phone. Disposition Plan: Will need to go to SNF eventually. Not quite ready for discharge.   IV access:  Peripheral IV  Procedures and diagnostic studies:    Dg Abd Acute W/chest  07/03/2014   Diffuse colonic dilation with air-fluid levels. There are no findings to suggest a small bowel obstruction. Findings likely due to a diffuse colonic adynamic ileus. Distal colonic obstruction is also possible. 2. No free air. 3. Lung findings consistent with congestive heart failure and bilateral pleural effusions. 4. No significant change in the appearance of the chest or abdomen when compared to the study dated 07/01/2014.     CXR 07/02/2014 Nasogastric tube tip in the stomach, with side-port in the EG junction. This should be advanced for optimal placement. Central and basilar airspace opacities without significant interval change. There also are unchanged pleural effusions.   Dg Abd Acute W/chest 07/01/2014 Marked colonic distention which may reflect distal bowel obstruction  or colonic ileus. No free air noted. Congestive heart failure and bilateral airspace opacities.   Nm Pulmonary Perf/Vent 06/26/2014 Intermediate probability for pulmonary embolism by PIOPED II. Bibasilar airspace disease and pleural effusions significantly limits the utility of V/Q scan.   CXR 06/26/2014 Slight improvement in the left lung consolidation. Persistent edema and right pleural effusion.   CXR2/14/2016 Bilateral mid to lower lung heterogeneous opacities, left greater than right, with associated moderate right and probable small left pleural effusions. Findings may represent pulmonary edema, infection or underlying atelectasis.   CXR 06/24/2014 Decreased small bilateral pleural effusions and right basilar atelectasis. Stable cardiomegaly.   Mr Hip Right Wo Contrast 06/24/2014 Nondisplaced RIGHT obturator ring fractures. Complementary nondisplaced RIGHT sacral ala fracture. Bilateral hip girdle muscular strain. Anasarca up. LEFT inguinal hernia containing a loop of sigmoid colon.   CXR 06/14/2014 Moderate right and small left pleural effusions, loculated left pleural effusion increased since 06/06/2014.   Dg Hip Unilat 06/23/2014 There is no acute bony abnormality of the right hip.   Ct Head Wo Contrast 06/23/2014 Atrophy and microvascular ischemic disease without acute intracranial process  Medical Consultants:  Cardiology - signed off Palliative medicine PCCM - signed off   Other Consultants:  PT  IAnti-Infectives:   Vancomycin 2/20 -->  2/24 Zosyn 2/20 --> 2/24 Ceftriaxone 2/24--> 2/25 Keflex 2/25  Subjective: Patient feels weaker this morning. Had 4-5 loose stools overnight. Denies any abdominal pain per se. Reports poor appetite.   Objective: Filed Vitals:   07/06/14 1400 07/06/14 1437 07/06/14 2012 07/07/14 0444  BP: 124/64  123/51 117/66  Pulse: 101  98 106  Temp: 98.7 F (37.1 C)  97.4 F (36.3 C) 98.6 F (37 C)  TempSrc: Oral  Oral  Oral  Resp: 18  28 22   Height:      Weight:      SpO2: 100% 98% 94% 97%    Intake/Output Summary (Last 24 hours) at 07/07/14 0755 Last data filed at 07/07/14 0446  Gross per 24 hour  Intake      3 ml  Output    775 ml  Net   -772 ml    Exam:   General:  Pt is alert, in no acute distress  Cardiovascular: Irregular rate and rhythm,  no rubs, no gallops  Respiratory: Clear to auscultation bilaterally, much improved air entry bilaterally. Few crackles at the bases.   Abdomen: Soft, non tender, slightly distended, bowel sounds present, no guarding  Neuro: Grossly nonfocal  Data Reviewed: Basic Metabolic Panel:  Recent Labs Lab 07/02/14 0410 07/03/14 0411 07/04/14 0418 07/05/14 0417 07/06/14 0406  NA 139 148* 151* 145 141  K 4.7 4.2 4.1 3.3* 3.0*  CL 100 106 110 105 107  CO2 27 32 31 32 28  GLUCOSE 110* 106* 109* 105* 144*  BUN 119* 112* 109* 89* 64*  CREATININE 2.94* 2.43* 2.36* 1.98* 1.69*  CALCIUM 8.6 8.3* 8.3* 7.8* 7.6*  MG 2.9* 3.1* 2.9* 2.7*  --   PHOS 6.0* 6.5* 4.5 2.9  --    CBC:  Recent Labs Lab 07/03/14 0411 07/04/14 0418 07/05/14 0417 07/06/14 0406 07/07/14 0521  WBC 5.0 5.8 5.8 5.8 6.5  HGB 10.9* 11.0* 10.1* 10.0* 9.5*  HCT 36.2* 36.2* 34.1* 33.3* 31.9*  MCV 83.4 84.0 83.8 84.1 84.2  PLT 128* 154 136* 138* 163     Recent Results (from the past 240 hour(s))  Culture, blood (routine x 2)     Status: None (Preliminary result)   Collection Time: 07/02/14  9:15 AM  Result Value Ref Range Status   Specimen Description BLOOD RIGHT ARM  Final   Special Requests   Final    BOTTLES DRAWN AEROBIC AND ANAEROBIC 10CC BOTH BOTTLES   Culture   Final           BLOOD CULTURE RECEIVED NO GROWTH TO DATE CULTURE WILL BE HELD FOR 5 DAYS BEFORE ISSUING A FINAL NEGATIVE REPORT Performed at [facility]    Report Status PENDING  Incomplete  Culture, blood (routine x 2)     Status: None (Preliminary result)   Collection Time: 07/02/14  9:21 AM   Result Value Ref Range Status   Specimen Description BLOOD RIGHT ARM  Final   Special Requests   Final    BOTTLES DRAWN AEROBIC AND ANAEROBIC 10CC BOTH BOTTLES   Culture   Final           BLOOD CULTURE RECEIVED NO GROWTH TO DATE CULTURE WILL BE HELD FOR 5 DAYS BEFORE ISSUING A FINAL NEGATIVE REPORT Performed at [facility]    Report Status PENDING  Incomplete  Culture, Urine     Status: None   Collection Time: 07/02/14 10:04 AM  Result Value Ref Range Status   Specimen Description URINE,  CATHETERIZED  Final   Special Requests NONE  Final   Colony Count   Final    >=100,000 COLONIES/ML Performed at [facility]    Culture   Final    ESCHERICHIA COLI Performed at [facility]    Report Status 07/04/2014 FINAL  Final   Organism ID, Bacteria ESCHERICHIA COLI  Final      Susceptibility   Escherichia coli - MIC*    AMPICILLIN RESISTANT      CEFAZOLIN <=4 SENSITIVE Sensitive     CEFTRIAXONE <=1 SENSITIVE Sensitive     CIPROFLOXACIN >=4 RESISTANT Resistant     GENTAMICIN >=16 RESISTANT Resistant     LEVOFLOXACIN >=8 RESISTANT Resistant     NITROFURANTOIN <=16 SENSITIVE Sensitive     TOBRAMYCIN 8 INTERMEDIATE Intermediate     TRIMETH/SULFA >=320 RESISTANT Resistant     PIP/TAZO <=4 SENSITIVE Sensitive     * ESCHERICHIA COLI     Scheduled Meds: . antiseptic oral rinse  7 mL Mouth Rinse q12n4p  . aspirin  81 mg Oral Daily  . cephALEXin  250 mg Oral 3 times per day  . chlorhexidine  15 mL Mouth Rinse BID  . docusate sodium  100 mg Oral BID  . enoxaparin (LOVENOX) injection  1.5 mg/kg Subcutaneous Q24H  . furosemide  40 mg Oral Daily  . ipratropium  0.5 mg Nebulization Q6H WA  . levalbuterol  1.25 mg Nebulization Q6H WA  . nicotine  21 mg Transdermal Daily  . pantoprazole (PROTONIX) IV  40 mg Intravenous Q24H  . polyethylene glycol  17 g Oral Daily  . potassium chloride  20 mEq Oral BID  . sennosides  5 mL Oral BID  . sodium chloride  3 mL  Intravenous Q12H  . Warfarin - Pharmacist Dosing Inpatient   Does not apply q1800   Continuous Infusions:   Nathan Heatwole MD  Pager [phone]  If 7PM-7AM, please contact night-coverage [email or web address] Password [facility]  07/07/2014 7:55 AM  LOS: 13 days

## 2014-07-07 NOTE — Progress Notes (Signed)
NUTRITION FOLLOW-UP  INTERVENTION: - Provide Ensure Complete po BID, each supplement provides 350 kcal and 13 grams of protein - Provide Magic cup TID with meals, each supplement provides 290 kcal and 9 grams of protein - RD will continue to monitor  NUTRITION DIAGNOSIS: Inadequate oral intake related to inability to eat as evidenced by NPO, now evidenced by PO intake of 15%.   Goal: Pt to meet >/= 90% of their estimated nutrition needs, unmet   Monitor:  Weight trend, PO intake, labs  ASSESSMENT: 74 yo male with multiple medical problems admitted with R hip fracture, hospital course complicated by worsening hypoxic resp failure in setting of CHF, COPD, new PE. Worsening renal function and ileus.   RD consulted for nutritional assessment, pt initially assessed 2/22. - Pt diet advanced to soft diet which he is tolerating. PO intake:15-50% - RD ordered supplements beginning today.  - Pt's weight has decreased since 2/12, -17 lb (pt with CHF, on Lasix) - Pt continues to be followed by palliative care d/t poor prognosis  Labs Reviewed: Low K Elevated BUN & Creatinine Elevated Mg  Height: Ht Readings from Last 1 Encounters:  07/01/14 6\' 1"  (1.854 m)    Weight: Wt Readings from Last 1 Encounters:  07/02/14 169 lb 12.1 oz (77 kg)  02/12  186 lb   BMI:  Body mass index is 22.4 kg/(m^2).  Estimated Nutritional Needs: Kcal: 2000-2200 Protein: 115-130 g Fluid: 2.0 L/day  Skin: stage II pressure ulcer on buttocks   Diet Order: DIET SOFT  EDUCATION NEEDS: -Education needs addressed   Intake/Output Summary (Last 24 hours) at 07/07/14 0943 Last data filed at 07/07/14 0446  Gross per 24 hour  Intake      3 ml  Output    775 ml  Net   -772 ml    Last BM: 2/24  Labs:   Recent Labs Lab 07/03/14 0411 07/04/14 0418 07/05/14 0417 07/06/14 0406  NA 148* 151* 145 141  K 4.2 4.1 3.3* 3.0*  CL 106 110 105 107  CO2 32 31 32 28  BUN 112* 109* 89* 64*  CREATININE  2.43* 2.36* 1.98* 1.69*  CALCIUM 8.3* 8.3* 7.8* 7.6*  MG 3.1* 2.9* 2.7*  --   PHOS 6.5* 4.5 2.9  --   GLUCOSE 106* 109* 105* 144*    CBG (last 3)  No results for input(s): GLUCAP in the last 72 hours.  Scheduled Meds: . antiseptic oral rinse  7 mL Mouth Rinse q12n4p  . aspirin  81 mg Oral Daily  . cephALEXin  250 mg Oral 3 times per day  . chlorhexidine  15 mL Mouth Rinse BID  . docusate sodium  100 mg Oral BID  . enoxaparin (LOVENOX) injection  1.5 mg/kg Subcutaneous Q24H  . feeding supplement (ENSURE COMPLETE)  237 mL Oral BID BM  . furosemide  40 mg Oral Daily  . ipratropium  0.5 mg Nebulization Q6H WA  . levalbuterol  1.25 mg Nebulization Q6H WA  . nicotine  21 mg Transdermal Daily  . pantoprazole (PROTONIX) IV  40 mg Intravenous Q24H  . polyethylene glycol  17 g Oral Daily  . potassium chloride  20 mEq Oral BID  . sennosides  5 mL Oral BID  . sodium chloride  3 mL Intravenous Q12H  . Warfarin - Pharmacist Dosing Inpatient   Does not apply q1800    Continuous Infusions:    Tilda FrancoLindsey Lesslie Mckeehan, MS, RD, LDN Pager: [phone] After Hours Pager: [phone]

## 2014-07-07 NOTE — Progress Notes (Signed)
PHARMACIST - PHYSICIAN COMMUNICATION DR:   Rito EhrlichKrishnan CONCERNING: Proton Pump Inhibitor IV to Oral Route Change Policy  The patient is receiving Protonix by the intravenous route. Based on criteria approved by the Pharmacy and Therapeutics Committee and the Medical Executive Committee, the medication is being converted to the equivalent oral dose form.   These criteria include:  -No Active GI bleeding  -Able to tolerate diet of full liquids (or better) or tube feeding  -Able to tolerate other medications by the oral or enteral route   If you have any questions about this conversion, please contact the Pharmacy Department (ext [phone]). Thank you.  Loralee PacasErin Maysie Parkhill, PharmD, BCPS 07/07/2014 10:27 AM

## 2014-07-07 NOTE — Progress Notes (Signed)
CSW continuing to follow.  Per MD, pt not yet medically ready for discharge today.   CSW met with pt and pt wife, Juliann Pulse at bedside. Pt cat from home present visiting pt.   CSW provided support to pt and pt wife as pt wife discussed that pt is slowly improving and pt diet is slowly improving. Pt wife discussed that pt very happy with pt cat present for visit.   CSW discussed with pt and pt wife that CSW updated [employer] and facility confirmed that pt can be admitted during the weekend if medically stable as facility currently has [employer] authorization.   If pt not ready until Monday, then [facility] will have to renew authorization.   CSW to continue to follow to provide support and assist with disposition planning when pt medically ready for discharge.  Alison Murray, MSW, [employer] [phone]

## 2014-07-08 LAB — CULTURE, BLOOD (ROUTINE X 2)
Culture: NO GROWTH
Culture: NO GROWTH

## 2014-07-08 LAB — BASIC METABOLIC PANEL
ANION GAP: 8 (ref 5–15)
BUN: 28 mg/dL — ABNORMAL HIGH (ref 6–23)
CALCIUM: 7.8 mg/dL — AB (ref 8.4–10.5)
CO2: 28 mmol/L (ref 19–32)
CREATININE: 1.23 mg/dL (ref 0.50–1.35)
Chloride: 103 mmol/L (ref 96–112)
GFR calc Af Amer: 65 mL/min — ABNORMAL LOW (ref >90)
GFR, EST NON AFRICAN AMERICAN: 56 mL/min — AB (ref >90)
GLUCOSE: 88 mg/dL (ref 70–99)
POTASSIUM: 3.9 mmol/L (ref 3.5–5.1)
Sodium: 139 mmol/L (ref 135–145)

## 2014-07-08 LAB — CBC
HCT: 33.3 % — ABNORMAL LOW (ref 39.0–52.0)
Hemoglobin: 10 g/dL — ABNORMAL LOW (ref 13.0–17.0)
MCH: 25.3 pg — ABNORMAL LOW (ref 26.0–34.0)
MCHC: 30 g/dL (ref 30.0–36.0)
MCV: 84.1 fL (ref 78.0–100.0)
PLATELETS: 220 10*3/uL (ref 150–400)
RBC: 3.96 MIL/uL — AB (ref 4.22–5.81)
RDW: 17.1 % — AB (ref 11.5–15.5)
WBC: 7.4 10*3/uL (ref 4.0–10.5)

## 2014-07-08 LAB — PROTIME-INR
INR: 1.46 (ref 0.00–1.49)
Prothrombin Time: 17.9 seconds — ABNORMAL HIGH (ref 11.6–15.2)

## 2014-07-08 MED ORDER — FUROSEMIDE 10 MG/ML IJ SOLN
40.0000 mg | Freq: Once | INTRAMUSCULAR | Status: AC
  Administered 2014-07-08: 40 mg via INTRAVENOUS
  Filled 2014-07-08: qty 4

## 2014-07-08 MED ORDER — WARFARIN SODIUM 4 MG PO TABS
4.0000 mg | ORAL_TABLET | Freq: Once | ORAL | Status: AC
Start: 2014-07-08 — End: 2014-07-08
  Administered 2014-07-08: 4 mg via ORAL
  Filled 2014-07-08: qty 1

## 2014-07-08 NOTE — Progress Notes (Signed)
ANTICOAGULATION CONSULT NOTE   Pharmacy Consult for Lovenox/Warfarin Indication: pulmonary embolus  No Known Allergies  Patient Measurements: Height: 6\' 1"  (185.4 cm) Weight: 169 lb 12.1 oz (77 kg) IBW/kg (Calculated) : 79.9  Vital Signs: Temp: 98.1 F (36.7 C) (02/27 0445) Temp Source: Oral (02/27 0445) BP: 123/61 mmHg (02/27 0445) Pulse Rate: 98 (02/27 0445)  Labs:  Recent Labs  07/05/14 1835  07/06/14 0406 07/07/14 0521 07/08/14 0411  HGB  --   < > 10.0* 9.5* 10.0*  HCT  --   --  33.3* 31.9* 33.3*  PLT  --   --  138* 163 220  LABPROT  --   --  17.6* 17.2* 17.9*  INR  --   --  1.43 1.39 1.46  HEPARINUNFRC 0.11*  --  0.24*  --   --   CREATININE  --   --  1.69*  --  1.23  < > = values in this interval not displayed.  Estimated Creatinine Clearance: 56.5 mL/min (by C-G formula based on Cr of 1.23).  Medications:  Scheduled:  . antiseptic oral rinse  7 mL Mouth Rinse q12n4p  . aspirin  81 mg Oral Daily  . cephALEXin  250 mg Oral 3 times per day  . chlorhexidine  15 mL Mouth Rinse BID  . docusate sodium  100 mg Oral BID  . enoxaparin (LOVENOX) injection  1.5 mg/kg Subcutaneous Q24H  . feeding supplement (ENSURE COMPLETE)  237 mL Oral BID BM  . furosemide  40 mg Intravenous Once  . furosemide  40 mg Oral Daily  . ipratropium  0.5 mg Nebulization TID  . levalbuterol  1.25 mg Nebulization TID  . nicotine  21 mg Transdermal Daily  . pantoprazole  40 mg Oral Daily  . polyethylene glycol  17 g Oral Daily  . potassium chloride  20 mEq Oral BID  . saccharomyces boulardii  250 mg Oral BID  . sennosides  5 mL Oral BID  . sodium chloride  3 mL Intravenous Q12H  . Warfarin - Pharmacist Dosing Inpatient   Does not apply q1800   Infusions:    PRN: sodium chloride, fentaNYL, ipratropium, levalbuterol, metoprolol, sodium chloride  Assessment: 74 yo male admitted 06/23/14 s/p fall. He was recently admitted 2/3-06/18/14 for an acute COPD exacerbation. On 2/14, he developed  acute respiratory distress, pO2 below reportable range and elevated ddimer. Per MD, plan for VQ scan, LE dopplers, and Pharmacy consulted to dose IV heparin for 2/14, warfarin added 2/16. Warfarin and heparin were stopped 2/20 due to concerns for SBO and supratherapeutic INR. Per pharmacist discussion with MD, plan  to restart heparin when INR < 3.0 which was done on 2/24.  Previous warfarin dosing and INRs reviewed - INR because supratherapeutic very quickly after only three doses:  INR 1.32 on 2/13 - 5mg  warfarin given on 2/16  INR 1.05 on 2/17 - 6mg  warfarin given on 2/17  INR 1.59 on 2/18 - 4 mg warfarin given on 2/18  INR 2.95 on 2/19 - warfarin held  INR 5.33 on 2/20 - warfarin dc'd  INR 5.73 on 2/21  INR 4.86 on 2/22  INR 4.68 on 2/23 - vitamin K given  INR 1.58 on 2/24 - IV heparin started  INR 1.43 on 2/25 - transitioned to lovenox/warfarin  Today, 07/08/2014   Day #3 Lovenox/Warfarin overlap  INR this AM up slightly to 1.46 following vitamin K 5mg  on 2/23, warfarin 4mg  given last PM  Hgb slightly increased to 10, Plts  improved to 220k  No bleeding reported  AoCKD state III - SCr decreased to 1.23, CrCl 56 ml/min  Receiving aspirin  daily  Soft diet and Ensure BID ordered - moderate intake recorded  Goal of Therapy:  Heparin level 0.3-0.7 units/ml Monitor platelets by anticoagulation protocol: Yes   Plan:   Repeat warfarin  PO x 1 today  Continue Lovenox 1.5 mg/kg SQ q24h  Stop aspirin once INR is therapeutic  Daily PT/INR  CBC ordered daily per MD  Per CHEST Guidelines, patient should receive a 5 day overlap of lovenox and warfarin therapy AND have an INR > or = 2.0 for 24 hours before stopping lovenox.  Arley Phenix RPh 07/08/2014, 1:50 PM Pager [phone]

## 2014-07-08 NOTE — Progress Notes (Addendum)
Patient ID: Nathan KellMaurice W Seibold, male   DOB: 05/03/1941, 74 y.o.   MRN: [id number]  [facility] PROGRESS NOTE  Nathan [name] AOZ:[id number] DOB: 10/17/1940 DOA: 06/23/2014 PCP: Lillia MountainGRIFFIN,JOHN JOSEPH, MD   Brief narrative:    74 y.o. male with extensive CAD, COPD with ongoing tobacco abuse, chronic CHF, HTN, peripheral vascular disease, GERD, CKD stage III, recently hospitalized from 06/14/2014 to 06/18/2014 for an acute COPD exacerbation, [facility] after an episode of fall that occurred several hours prior to this admission while he was trying to go to the bathroom. He stood up and his right leg gave away and he fell on his right side, subsequently unable to stand up and bear weight.   Major events since admission: 2/13 - MRI right hip with non displaced ring obturator fracture, conservative mngt per ortho  2/14 - dyspnea, lasix started as CXR with more vascular congestion, transferred to SDU  2/15 - VQ scan with intermediate prob PE, heparin drip started with Coumadin bridging  2/17 - developed sudden onset a-fib  2/20 - lethargy, hypoxia, oxygen sat 91% on 5 L, abd distension, ? SBO, transfer to ICU, PCCM consulted, ABX XRAY pending  2/21 - still lethargic, currently on BIPAP, started Zosyn and Vanc, overnight as pct > 10, palliative care team consulted  2/22 - transferred to medical unit, stable and doing better, off BiPAP, abd XRAY with persistent ileus, pt refusing NGT 2/23 - no BM since 2/20, pt asking to eat but aware of ileus, abd more distended this AM, still refusing NGT  2/24 - ileus has improved. Started on diet. He is tolerating. Oxygen requirements improved as well.  Assessment/Plan:    Acute respiratory failure with hypoxia - This is multifactorial and secondary to acute on chronic systolic CHF, PE, COPD (GOLD stage III) - was initially getting better but 2/20 pt developed respiratory distress again. Now improved. - Patient has made a  significant improvement. He is now only on nasal cannula.  - will also continue bronchodilators scheduled and as needed (levalbuterol and ipratropium per PCCM) - Anticoagulation has been resumed. Continue Lasix. Monitor renal function closely.  Colonic ileus with Abdominal distention - Improved. Patient continues to have bowel movements. Although not as frequently. C diff is negative. This was most likely this is due to resolution of the ileus. Avoid anti-diarrheal agents. We will place him on probiotics. Continue current diet. - placed on bowel regimen and avoiding narcotics as possible  Sepsis, severe, 2/20, E. Coli UTI Resolved - Initially HR in 110 - 120's with RR in 30's, pct > 10 and 23, zosyn and vanc started 2/20 - please note that blood cultures from 2/15 and again from 2/21 so far negative to date  - urine culture report with E. Coli. Continue Keflex.   Acute on chronic systolic heart failure - 2 D ECHO this admission with EF 40% - He's improved. Continue Lasix for now.  - Renal function in the morning  Intermediate probability of pulmonary emboli - Continue Lovenox and warfarin. Await therapeutic INR. He was previously on intravenous heparin.  A-fib with RVR - Stable - changed albuterol to levalbuterol 2/17 - On warfarin  Acute encephalopathy, worse 2/20, from sepsis UTI E. Coli, COPD, acute systolic CHF  - Patient is improved and close to baseline  CAD status post CABG/acute on chronic systolic heart failure - Stable. Cardiology has seen. - on aspirin. This can be stopped once INR is therapeutic.  Acute on  chronic kidney disease stage III - Creatinine continues to improve.  Hypernatremia This was likely due to Lasix. Now improved.  Leukocytosis Resolved  Tobacco abuse - nicotine patch   Anemia of chronic disease, thrombocytopenia - no signs of active bleeding, Hg relatively stable  - Plt count stable   Right obturator ring fracture/right sacral and  left fractures/bilateral hip girdle muscular strain/Right hip pain - nondisplaced right-sided pelvic ring disruption  - nonweightbearing right lower extremity, okay to weightbearing per ortho  - should be a self-limited problem  - per ortho rec's, will need follow-up with Dr. August Saucer in 3 weeks for repeat radiographs to make sure everything is healing  Ear lobe laceration - Status post repair  Severe protein calorie malnutrition  - in the context of acute illness. Now on diet but reports poor appetite. This will be very challenging to address. He does have numerous comorbidities. Dietitian is following. Son reports improved oral intake last night.   Skin breakdown sacral area Wound care consult. Leave Foley in for now.  DVT prophylaxis: Lovenox. Warfarin Code Status: DNR Family Communication: plan of care discussed with patient and son at bedside. Discussed with his wife over the phone. Disposition Plan: Will need to go to SNF eventually. Not quite ready for discharge. Anticipate discharge early next week   IV access:  Peripheral IV  Procedures and diagnostic studies:    Dg Abd Acute W/chest  07/03/2014   Diffuse colonic dilation with air-fluid levels. There are no findings to suggest a small bowel obstruction. Findings likely due to a diffuse colonic adynamic ileus. Distal colonic obstruction is also possible. 2. No free air. 3. Lung findings consistent with congestive heart failure and bilateral pleural effusions. 4. No significant change in the appearance of the chest or abdomen when compared to the study dated 07/01/2014.     CXR 07/02/2014 Nasogastric tube tip in the stomach, with side-port in the EG junction. This should be advanced for optimal placement. Central and basilar airspace opacities without significant interval change. There also are unchanged pleural effusions.   Dg Abd Acute W/chest 07/01/2014 Marked colonic distention which may reflect distal bowel obstruction  or colonic ileus. No free air noted. Congestive heart failure and bilateral airspace opacities.   Nm Pulmonary Perf/Vent 06/26/2014 Intermediate probability for pulmonary embolism by PIOPED II. Bibasilar airspace disease and pleural effusions significantly limits the utility of V/Q scan.   CXR 06/26/2014 Slight improvement in the left lung consolidation. Persistent edema and right pleural effusion.   CXR2/14/2016 Bilateral mid to lower lung heterogeneous opacities, left greater than right, with associated moderate right and probable small left pleural effusions. Findings may represent pulmonary edema, infection or underlying atelectasis.   CXR 06/24/2014 Decreased small bilateral pleural effusions and right basilar atelectasis. Stable cardiomegaly.   Mr Hip Right Wo Contrast 06/24/2014 Nondisplaced RIGHT obturator ring fractures. Complementary nondisplaced RIGHT sacral ala fracture. Bilateral hip girdle muscular strain. Anasarca up. LEFT inguinal hernia containing a loop of sigmoid colon.   CXR 06/14/2014 Moderate right and small left pleural effusions, loculated left pleural effusion increased since 06/06/2014.   Dg Hip Unilat 06/23/2014 There is no acute bony abnormality of the right hip.   Ct Head Wo Contrast 06/23/2014 Atrophy and microvascular ischemic disease without acute intracranial process  Medical Consultants:  Cardiology - signed off Palliative medicine PCCM - signed off   Other Consultants:  PT  IAnti-Infectives:   Vancomycin 2/20 --> 2/24 Zosyn 2/20 --> 2/24 Ceftriaxone 2/24--> 2/25 Keflex 2/25  Subjective:  Patient feels better this morning. Not having frequent bowel movements.   Objective: Filed Vitals:   07/07/14 1433 07/08/14 0039 07/08/14 0445 07/08/14 0739  BP: 130/76 127/57 123/61   Pulse: 108 105 98   Temp: 98.3 F (36.8 C) 98.5 F (36.9 C) 98.1 F (36.7 C)   TempSrc: Oral Oral Oral   Resp: Height:      Weight:       SpO2: 99% 98% 93% 99%    Intake/Output Summary (Last 24 hours) at 07/08/14 2956 Last data filed at 07/08/14 0430  Gross per 24 hour  Intake    720 ml  Output   2050 ml  Net  -1330 ml    Exam:   General:  Pt is alert, in no acute distress  Cardiovascular: Irregular rate and rhythm,  no rubs, no gallops  Respiratory: Clear to auscultation bilaterally, much improved air entry bilaterally. Few crackles at the bases.   Abdomen: Soft, non tender, slightly distended, bowel sounds present, no guarding  Neuro: No focal deficits.  Data Reviewed: Basic Metabolic Panel:  Recent Labs Lab 07/02/14 0410 07/03/14 0411 07/04/14 0418 07/05/14 0417 07/06/14 0406 07/08/14 0411  NA 139 148* 151* 145 141 139  K 4.7 4.2 4.1 3.3* 3.0* 3.9  CL 100 106 110 105 107 103  CO2 27 32 31 32 28 28  GLUCOSE 110* 106* 109* 105* 144* 88  BUN 119* 112* 109* 89* 64* 28*  CREATININE 2.94* 2.43* 2.36* 1.98* 1.69* 1.23  CALCIUM 8.6 8.3* 8.3* 7.8* 7.6* 7.8*  MG 2.9* 3.1* 2.9* 2.7*  --   --   PHOS 6.0* 6.5* 4.5 2.9  --   --    CBC:  Recent Labs Lab 07/04/14 0418 07/05/14 0417 07/06/14 0406 07/07/14 0521 07/08/14 0411  WBC 5.8 5.8 5.8 6.5 7.4  HGB 11.0* 10.1* 10.0* 9.5* 10.0*  HCT 36.2* 34.1* 33.3* 31.9* 33.3*  MCV 84.0 83.8 84.1 84.2 84.1  PLT 154 136* 138* 163 220     Recent Results (from the past 240 hour(s))  Culture, blood (routine x 2)     Status: None   Collection Time: 07/02/14  9:15 AM  Result Value Ref Range Status   Specimen Description BLOOD RIGHT ARM  Final   Special Requests   Final    BOTTLES DRAWN AEROBIC AND ANAEROBIC 10CC BOTH BOTTLES   Culture   Final    NO GROWTH 5 DAYS Performed at [facility]    Report Status 07/08/2014 FINAL  Final  Culture, blood (routine x 2)     Status: None   Collection Time: 07/02/14  9:21 AM  Result Value Ref Range Status   Specimen Description BLOOD RIGHT ARM  Final   Special Requests   Final    BOTTLES DRAWN AEROBIC AND  ANAEROBIC 10CC BOTH BOTTLES   Culture   Final    NO GROWTH 5 DAYS Performed at [facility]    Report Status 07/08/2014 FINAL  Final  Culture, Urine     Status: None   Collection Time: 07/02/14 10:04 AM  Result Value Ref Range Status   Specimen Description URINE, CATHETERIZED  Final   Special Requests NONE  Final   Colony Count   Final    >=100,000 COLONIES/ML Performed at [facility]    Culture   Final    ESCHERICHIA COLI Performed at [facility]    Report Status 07/04/2014 FINAL  Final   Organism  ID, Bacteria ESCHERICHIA COLI  Final      Susceptibility   Escherichia coli - MIC*    AMPICILLIN RESISTANT      CEFAZOLIN <=4 SENSITIVE Sensitive     CEFTRIAXONE <=1 SENSITIVE Sensitive     CIPROFLOXACIN >=4 RESISTANT Resistant     GENTAMICIN >=16 RESISTANT Resistant     LEVOFLOXACIN >=8 RESISTANT Resistant     NITROFURANTOIN <=16 SENSITIVE Sensitive     TOBRAMYCIN 8 INTERMEDIATE Intermediate     TRIMETH/SULFA >=320 RESISTANT Resistant     PIP/TAZO <=4 SENSITIVE Sensitive     * ESCHERICHIA COLI  Clostridium Difficile by PCR     Status: None   Collection Time: 07/06/14 11:45 PM  Result Value Ref Range Status   C difficile by pcr NEGATIVE NEGATIVE Final    Comment: Performed at [facility]     Scheduled Meds: . antiseptic oral rinse  7 mL Mouth Rinse q12n4p  . aspirin  81 mg Oral Daily  . cephALEXin  250 mg Oral 3 times per day  . chlorhexidine  15 mL Mouth Rinse BID  . docusate sodium  100 mg Oral BID  . enoxaparin (LOVENOX) injection  1.5 mg/kg Subcutaneous Q24H  . feeding supplement (ENSURE COMPLETE)  237 mL Oral BID BM  . furosemide  40 mg Oral Daily  . ipratropium  0.5 mg Nebulization TID  . levalbuterol  1.25 mg Nebulization TID  . nicotine  21 mg Transdermal Daily  . pantoprazole  40 mg Oral Daily  . polyethylene glycol  17 g Oral Daily  . potassium chloride  20 mEq Oral BID  . saccharomyces boulardii  250 mg Oral BID  .  sennosides  5 mL Oral BID  . sodium chloride  3 mL Intravenous Q12H  . Warfarin - Pharmacist Dosing Inpatient   Does not apply q1800   Continuous Infusions:   Camarie Mctigue MD  Pager [phone]  If 7PM-7AM, please contact night-coverage [email or web address] Password [facility]  07/08/2014 8:38 AM  LOS: 14 days

## 2014-07-09 LAB — CBC
HEMATOCRIT: 33.3 % — AB (ref 39.0–52.0)
Hemoglobin: 10.3 g/dL — ABNORMAL LOW (ref 13.0–17.0)
MCH: 26.1 pg (ref 26.0–34.0)
MCHC: 30.9 g/dL (ref 30.0–36.0)
MCV: 84.3 fL (ref 78.0–100.0)
Platelets: 268 10*3/uL (ref 150–400)
RBC: 3.95 MIL/uL — ABNORMAL LOW (ref 4.22–5.81)
RDW: 17.6 % — ABNORMAL HIGH (ref 11.5–15.5)
WBC: 10.1 10*3/uL (ref 4.0–10.5)

## 2014-07-09 LAB — BASIC METABOLIC PANEL
ANION GAP: 7 (ref 5–15)
BUN: 25 mg/dL — ABNORMAL HIGH (ref 6–23)
CALCIUM: 7.7 mg/dL — AB (ref 8.4–10.5)
CO2: 27 mmol/L (ref 19–32)
Chloride: 100 mmol/L (ref 96–112)
Creatinine, Ser: 1.19 mg/dL (ref 0.50–1.35)
GFR, EST AFRICAN AMERICAN: 67 mL/min — AB (ref >90)
GFR, EST NON AFRICAN AMERICAN: 58 mL/min — AB (ref >90)
GLUCOSE: 94 mg/dL (ref 70–99)
Potassium: 4.2 mmol/L (ref 3.5–5.1)
Sodium: 134 mmol/L — ABNORMAL LOW (ref 135–145)

## 2014-07-09 LAB — PROTIME-INR
INR: 1.72 — ABNORMAL HIGH (ref 0.00–1.49)
Prothrombin Time: 20.3 seconds — ABNORMAL HIGH (ref 11.6–15.2)

## 2014-07-09 MED ORDER — FUROSEMIDE 10 MG/ML IJ SOLN
40.0000 mg | Freq: Once | INTRAMUSCULAR | Status: AC
  Administered 2014-07-09: 40 mg via INTRAVENOUS
  Filled 2014-07-09: qty 4

## 2014-07-09 MED ORDER — WARFARIN SODIUM 2 MG PO TABS
2.0000 mg | ORAL_TABLET | Freq: Once | ORAL | Status: AC
  Administered 2014-07-09: 2 mg via ORAL
  Filled 2014-07-09: qty 1

## 2014-07-09 MED ORDER — DRONABINOL 2.5 MG PO CAPS
2.5000 mg | ORAL_CAPSULE | Freq: Every day | ORAL | Status: DC
  Administered 2014-07-09 – 2014-07-10 (×2): 2.5 mg via ORAL
  Filled 2014-07-09 (×2): qty 1

## 2014-07-09 NOTE — Progress Notes (Signed)
Patient ID: Nathan [name] Pascarella, male   DOB: 05/05/1941, 74 y.o.   MRN: [id number]  [facility] PROGRESS NOTE  Nathan [name] Rosemeyer YNW:[id number] DOB: 10/19/1940 DOA: 06/23/2014 PCP: Lillia MountainGRIFFIN,JOHN JOSEPH, MD   Brief narrative:    74 y.o. male with extensive CAD, COPD with ongoing tobacco abuse, chronic CHF, HTN, peripheral vascular disease, GERD, CKD stage III, recently hospitalized from 06/14/2014 to 06/18/2014 for an acute COPD exacerbation, [facility] after an episode of fall that occurred several hours prior to this admission while he was trying to go to the bathroom. He stood up and his right leg gave away and he fell on his right side, subsequently unable to stand up and bear weight.   Major events since admission: 2/13 - MRI right hip with non displaced ring obturator fracture, conservative mngt per ortho  2/14 - dyspnea, lasix started as CXR with more vascular congestion, transferred to SDU  2/15 - VQ scan with intermediate prob PE, heparin drip started with Coumadin bridging  2/17 - developed sudden onset a-fib  2/20 - lethargy, hypoxia, oxygen sat 91% on 5 L, abd distension, ? SBO, transfer to ICU, PCCM consulted, ABX XRAY pending  2/21 - still lethargic, currently on BIPAP, started Zosyn and Vanc, overnight as pct > 10, palliative care team consulted  2/22 - transferred to medical unit, stable and doing better, off BiPAP, abd XRAY with persistent ileus, pt refusing NGT 2/23 - no BM since 2/20, pt asking to eat but aware of ileus, abd more distended this AM, still refusing NGT  2/24 - ileus has improved. Started on diet. He is tolerating. Oxygen requirements improved as well.  Assessment/Plan:    Acute respiratory failure with hypoxia - This is multifactorial and secondary to acute on chronic systolic CHF, PE, COPD (GOLD stage III) - Patient has made a significant improvement. He is now only on nasal cannula.  - will also continue bronchodilators scheduled  and as needed (levalbuterol and ipratropium per PCCM) - Anticoagulation has been resumed. Continue Lasix. Monitor renal function closely.  Colonic ileus with Abdominal distention - Improved. Patient continues to have bowel movements. Although not as frequently. C diff is negative. This was most likely this is due to resolution of the ileus. Avoid anti-diarrheal agents. Continue probiotics. Continue current diet. - placed on bowel regimen and avoiding narcotics as possible  Sepsis, severe, 2/20, E. Coli UTI Resolved - Initially HR in 110 - 120's with RR in 30's, pct > 10 and 23, zosyn and vanc started 2/20 - please note that blood cultures from 2/15 and again from 2/21 so far negative to date  - urine culture report with E. Coli. Continue Keflex.   Acute on chronic systolic heart failure - 2 D ECHO this admission with EF 40% - He's improved. Continue Lasix for now.  - Renal function in the morning  Intermediate probability of pulmonary emboli - Continue Lovenox and warfarin. Await therapeutic INR. He was previously on intravenous heparin.  A-fib with RVR - Stable - changed albuterol to levalbuterol 2/17 - On warfarin  Acute encephalopathy, worse 2/20, from sepsis UTI E. Coli, COPD, acute systolic CHF  - Patient is improved and close to baseline  CAD status post CABG/acute on chronic systolic heart failure - Stable. Cardiology has seen. - on aspirin. This can be stopped once INR is therapeutic.  Acute on chronic kidney disease stage III - Creatinine continues to improve.  Hypernatremia This was likely due to Lasix.  Now improved.  Leukocytosis Resolved  Tobacco abuse - nicotine patch   Anemia of chronic disease, thrombocytopenia - no signs of active bleeding, Hg relatively stable  - Plt count stable   Right obturator ring fracture/right sacral and left fractures/bilateral hip girdle muscular strain/Right hip pain - nondisplaced right-sided pelvic ring disruption   - nonweightbearing right lower extremity, okay to weightbearing per ortho  - should be a self-limited problem  - per ortho rec's, will need follow-up with Dr. August Saucer in 3 weeks for repeat radiographs to make sure everything is healing  Ear lobe laceration - Status post repair  Severe protein calorie malnutrition  - in the context of acute illness. Now on diet but reports poor appetite. This will be very challenging to address. He does have numerous comorbidities. Dietitian is following. Family inquiring about appetite stimulants. It may not help but probably won't hurt. Will start Marinol.   Skin breakdown sacral area Wound care consult. Leave Foley in for now.  DVT prophylaxis: Lovenox. Warfarin Code Status: DNR Family Communication:Discussed with patient. Discussed with the wife over the phone. Disposition Plan: Patient is improved. Anticipate discharge to skilled nursing facility tomorrow.   IV access:  Peripheral IV  Procedures and diagnostic studies:    Dg Abd Acute [name]/chest  07/03/2014   Diffuse colonic dilation with air-fluid levels. There are no findings to suggest a small bowel obstruction. Findings likely due to a diffuse colonic adynamic ileus. Distal colonic obstruction is also possible. 2. No free air. 3. Lung findings consistent with congestive heart failure and bilateral pleural effusions. 4. No significant change in the appearance of the chest or abdomen when compared to the study dated 07/01/2014.     CXR 07/02/2014 Nasogastric tube tip in the stomach, with side-port in the EG junction. This should be advanced for optimal placement. Central and basilar airspace opacities without significant interval change. There also are unchanged pleural effusions.   Dg Abd Acute [name]/chest 07/01/2014 Marked colonic distention which may reflect distal bowel obstruction or colonic ileus. No free air noted. Congestive heart failure and bilateral airspace opacities.   Nm Pulmonary  Perf/Vent 06/26/2014 Intermediate probability for pulmonary embolism by PIOPED II. Bibasilar airspace disease and pleural effusions significantly limits the utility of V/Q scan.   CXR 06/26/2014 Slight improvement in the left lung consolidation. Persistent edema and right pleural effusion.   CXR2/14/2016 Bilateral mid to lower lung heterogeneous opacities, left greater than right, with associated moderate right and probable small left pleural effusions. Findings may represent pulmonary edema, infection or underlying atelectasis.   CXR 06/24/2014 Decreased small bilateral pleural effusions and right basilar atelectasis. Stable cardiomegaly.   Mr Hip Right Wo Contrast 06/24/2014 Nondisplaced RIGHT obturator ring fractures. Complementary nondisplaced RIGHT sacral ala fracture. Bilateral hip girdle muscular strain. Anasarca up. LEFT inguinal hernia containing a loop of sigmoid colon.   CXR 06/14/2014 Moderate right and small left pleural effusions, loculated left pleural effusion increased since 06/06/2014.   Dg Hip Unilat 06/23/2014 There is no acute bony abnormality of the right hip.   Ct Head Wo Contrast 06/23/2014 Atrophy and microvascular ischemic disease without acute intracranial process  Medical Consultants:  Cardiology - signed off Palliative medicine PCCM - signed off   Other Consultants:  PT  IAnti-Infectives:   Vancomycin 2/20 --> 2/24 Zosyn 2/20 --> 2/24 Ceftriaxone 2/24--> 2/25 Keflex 2/25  Subjective: Patient continues to feel better. No new complaints.  Objective: Filed Vitals:   07/08/14 1418 07/08/14 1957 07/08/14 2045 07/09/14 0425  BP:  117/69  115/57 118/77  Pulse: 104  61 106  Temp: 97.9 F (36.6 C)  97.7 F (36.5 C) 98.4 F (36.9 C)  TempSrc: Oral  Oral Oral  Resp: 20   20  Height:      Weight:      SpO2: 96% 99% 94% 96%    Intake/Output Summary (Last 24 hours) at 07/09/14 1610 Last data filed at 07/09/14 0601  Gross per 24 hour   Intake    600 ml  Output   1800 ml  Net  -1200 ml    Exam:   General:  Pt is alert, in no acute distress  Cardiovascular: Irregular rate and rhythm,  no rubs, no gallops  Respiratory: Improving air entry bilaterally. Few crackles at the bases.    Abdomen: Soft, non tender, slightly distended, bowel sounds present, no guarding  Neuro: No focal deficits.  Data Reviewed: Basic Metabolic Panel:  Recent Labs Lab 07/03/14 0411 07/04/14 0418 07/05/14 0417 07/06/14 0406 07/08/14 0411 07/09/14 0400  NA 148* 151* 145 141 139 134*  K 4.2 4.1 3.3* 3.0* 3.9 4.2  CL 106 110 105 107 103 100  CO2 32 31 32 GLUCOSE 106* 109* 105* 144* 88 94  BUN 112* 109* 89* 64* 28* 25*  CREATININE 2.43* 2.36* 1.98* 1.69* 1.23 1.19  CALCIUM 8.3* 8.3* 7.8* 7.6* 7.8* 7.7*  MG 3.1* 2.9* 2.7*  --   --   --   PHOS 6.5* 4.5 2.9  --   --   --    CBC:  Recent Labs Lab 07/05/14 0417 07/06/14 0406 07/07/14 0521 07/08/14 0411 07/09/14 0400  WBC 5.8 5.8 6.5 7.4 10.1  HGB 10.1* 10.0* 9.5* 10.0* 10.3*  HCT 34.1* 33.3* 31.9* 33.3* 33.3*  MCV 83.8 84.1 84.2 84.1 84.3  PLT 136* 138* 163 220 268     Recent Results (from the past 240 hour(s))  Culture, blood (routine x 2)     Status: None   Collection Time: 07/02/14  9:15 AM  Result Value Ref Range Status   Specimen Description BLOOD RIGHT ARM  Final   Special Requests   Final    BOTTLES DRAWN AEROBIC AND ANAEROBIC 10CC BOTH BOTTLES   Culture   Final    NO GROWTH 5 DAYS Performed at [facility]    Report Status 07/08/2014 FINAL  Final  Culture, blood (routine x 2)     Status: None   Collection Time: 07/02/14  9:21 AM  Result Value Ref Range Status   Specimen Description BLOOD RIGHT ARM  Final   Special Requests   Final    BOTTLES DRAWN AEROBIC AND ANAEROBIC 10CC BOTH BOTTLES   Culture   Final    NO GROWTH 5 DAYS Performed at [facility]    Report Status 07/08/2014 FINAL  Final  Culture, Urine     Status: None    Collection Time: 07/02/14 10:04 AM  Result Value Ref Range Status   Specimen Description URINE, CATHETERIZED  Final   Special Requests NONE  Final   Colony Count   Final    >=100,000 COLONIES/ML Performed at [facility]    Culture   Final    ESCHERICHIA COLI Performed at [facility]    Report Status 07/04/2014 FINAL  Final   Organism ID, Bacteria ESCHERICHIA COLI  Final      Susceptibility   Escherichia coli - MIC*    AMPICILLIN RESISTANT  CEFAZOLIN <=4 SENSITIVE Sensitive     CEFTRIAXONE <=1 SENSITIVE Sensitive     CIPROFLOXACIN >=4 RESISTANT Resistant     GENTAMICIN >=16 RESISTANT Resistant     LEVOFLOXACIN >=8 RESISTANT Resistant     NITROFURANTOIN <=16 SENSITIVE Sensitive     TOBRAMYCIN 8 INTERMEDIATE Intermediate     TRIMETH/SULFA >=320 RESISTANT Resistant     PIP/TAZO <=4 SENSITIVE Sensitive     * ESCHERICHIA COLI  Clostridium Difficile by PCR     Status: None   Collection Time: 07/06/14 11:45 PM  Result Value Ref Range Status   C difficile by pcr NEGATIVE NEGATIVE Final    Comment: Performed at [facility]     Scheduled Meds: . antiseptic oral rinse  7 mL Mouth Rinse q12n4p  . aspirin  81 mg Oral Daily  . cephALEXin  250 mg Oral 3 times per day  . chlorhexidine  15 mL Mouth Rinse BID  . docusate sodium  100 mg Oral BID  . enoxaparin (LOVENOX) injection  1.5 mg/kg Subcutaneous Q24H  . feeding supplement (ENSURE COMPLETE)  237 mL Oral BID BM  . furosemide  40 mg Oral Daily  . ipratropium  0.5 mg Nebulization TID  . levalbuterol  1.25 mg Nebulization TID  . nicotine  21 mg Transdermal Daily  . pantoprazole  40 mg Oral Daily  . polyethylene glycol  17 g Oral Daily  . potassium chloride  20 mEq Oral BID  . saccharomyces boulardii  250 mg Oral BID  . sennosides  5 mL Oral BID  . sodium chloride  3 mL Intravenous Q12H  . Warfarin - Pharmacist Dosing Inpatient   Does not apply q1800   Continuous Infusions:   Pharell Rolfson  MD  Pager [phone]  If 7PM-7AM, please contact night-coverage [email or web address] Password [facility]  07/09/2014 8:08 AM  LOS: 15 days

## 2014-07-09 NOTE — Progress Notes (Signed)
ANTICOAGULATION CONSULT NOTE   Pharmacy Consult for Lovenox/Warfarin Indication: pulmonary embolus  No Known Allergies  Patient Measurements: Height: 6\' 1"  (185.4 cm) Weight: 169 lb 12.1 oz (77 kg) IBW/kg (Calculated) : 79.9  Vital Signs: Temp: 98.4 F (36.9 C) (02/28 0425) Temp Source: Oral (02/28 0425) BP: 118/77 mmHg (02/28 0425) Pulse Rate: 106 (02/28 0425)  Labs:  Recent Labs  07/07/14 0521 07/08/14 0411 07/09/14 0400  HGB 9.5* 10.0* 10.3*  HCT 31.9* 33.3* 33.3*  PLT 163 220 268  LABPROT 17.2* 17.9* 20.3*  INR 1.39 1.46 1.72*  CREATININE  --  1.23 1.19    Estimated Creatinine Clearance: 58.4 mL/min (by C-G formula based on Cr of 1.19).  Medications:  Scheduled:  . antiseptic oral rinse  7 mL Mouth Rinse q12n4p  . aspirin  81 mg Oral Daily  . cephALEXin  250 mg Oral 3 times per day  . chlorhexidine  15 mL Mouth Rinse BID  . docusate sodium  100 mg Oral BID  . enoxaparin (LOVENOX) injection  1.5 mg/kg Subcutaneous Q24H  . feeding supplement (ENSURE COMPLETE)  237 mL Oral BID BM  . furosemide  40 mg Intravenous Once  . furosemide  40 mg Oral Daily  . ipratropium  0.5 mg Nebulization TID  . levalbuterol  1.25 mg Nebulization TID  . nicotine  21 mg Transdermal Daily  . pantoprazole  40 mg Oral Daily  . polyethylene glycol  17 g Oral Daily  . potassium chloride  20 mEq Oral BID  . saccharomyces boulardii  250 mg Oral BID  . sennosides  5 mL Oral BID  . sodium chloride  3 mL Intravenous Q12H  . Warfarin - Pharmacist Dosing Inpatient   Does not apply q1800   Infusions:    PRN: sodium chloride, fentaNYL, ipratropium, levalbuterol, metoprolol, sodium chloride  Assessment: 74 yo male admitted 06/23/14 s/p fall. He was recently admitted 2/3-06/18/14 for an acute COPD exacerbation. On 2/14, he developed acute respiratory distress, pO2 below reportable range and elevated ddimer. Per MD, plan for VQ scan, LE dopplers, and Pharmacy consulted to dose IV heparin for  2/14, warfarin added 2/16. Warfarin and heparin were stopped 2/20 due to concerns for SBO and supratherapeutic INR. Per pharmacist discussion with MD, plan  to restart heparin when INR < 3.0 which was done on 2/24.  Previous warfarin dosing and INRs reviewed - INR because supratherapeutic very quickly after only three doses:  INR 1.32 on 2/13 - 5mg  warfarin given on 2/16  INR 1.05 on 2/17 - 6mg  warfarin given on 2/17  INR 1.59 on 2/18 - 4 mg warfarin given on 2/18  INR 2.95 on 2/19 - warfarin held  INR 5.33 on 2/20 - warfarin dc'd  INR 5.73 on 2/21  INR 4.86 on 2/22  INR 4.68 on 2/23 - vitamin K given  INR 1.58 on 2/24 - IV heparin started  INR 1.43 on 2/25 - transitioned to lovenox/warfarin  Today, 07/09/2014   Day #4 Lovenox/Warfarin overlap  INR up dramatically to 1.72 , warfarin 4mg  given last PM  Hgb slightly increased to 10.3, Plts WNL  No bleeding reported  AoCKD state III - SCr decreased to 1.19, CrCl 58 ml/min  Receiving aspirin 81mg  daily  Soft diet and Ensure BID ordered -~25%  intake recorded yesterday and only 1 Ensure consumed  Goal of Therapy:  Heparin level 0.3-0.7 units/ml Monitor platelets by anticoagulation protocol: Yes   Plan:   warfarin 2mg  PO x 1 today  Continue  Lovenox 1.5 mg/kg SQ q24h  Stop aspirin once INR is therapeutic  Daily PT/INR  CBC ordered daily per MD  Per CHEST Guidelines, patient should receive a 5 day overlap of lovenox and warfarin therapy AND have an INR > or = 2.0 for 24 hours before stopping lovenox.  Arley Phenix RPh 07/09/2014, 9:39 AM Pager [phone]

## 2014-07-10 ENCOUNTER — Encounter (HOSPITAL_COMMUNITY): Payer: Self-pay | Admitting: Emergency Medicine

## 2014-07-10 ENCOUNTER — Emergency Department (HOSPITAL_COMMUNITY)
Admission: EM | Admit: 2014-07-10 | Discharge: 2014-07-11 | Disposition: A | Discharge status: Home / Self Care | Payer: Medicare PPO | Attending: Emergency Medicine | Admitting: Emergency Medicine

## 2014-07-10 ENCOUNTER — Emergency Department (HOSPITAL_COMMUNITY): Payer: Medicare PPO

## 2014-07-10 DIAGNOSIS — I480 Paroxysmal atrial fibrillation: Secondary | ICD-10-CM | POA: Insufficient documentation

## 2014-07-10 DIAGNOSIS — J449 Chronic obstructive pulmonary disease, unspecified: Secondary | ICD-10-CM | POA: Insufficient documentation

## 2014-07-10 DIAGNOSIS — S7002XD Contusion of left hip, subsequent encounter: Secondary | ICD-10-CM | POA: Insufficient documentation

## 2014-07-10 DIAGNOSIS — Z79899 Other long term (current) drug therapy: Secondary | ICD-10-CM | POA: Insufficient documentation

## 2014-07-10 DIAGNOSIS — Z862 Personal history of diseases of the blood and blood-forming organs and certain disorders involving the immune mechanism: Secondary | ICD-10-CM | POA: Insufficient documentation

## 2014-07-10 DIAGNOSIS — Z9889 Other specified postprocedural states: Secondary | ICD-10-CM | POA: Insufficient documentation

## 2014-07-10 DIAGNOSIS — M25551 Pain in right hip: Secondary | ICD-10-CM

## 2014-07-10 DIAGNOSIS — Z951 Presence of aortocoronary bypass graft: Secondary | ICD-10-CM | POA: Insufficient documentation

## 2014-07-10 DIAGNOSIS — Z7901 Long term (current) use of anticoagulants: Secondary | ICD-10-CM | POA: Insufficient documentation

## 2014-07-10 DIAGNOSIS — N183 Chronic kidney disease, stage 3 (moderate): Secondary | ICD-10-CM | POA: Insufficient documentation

## 2014-07-10 DIAGNOSIS — M109 Gout, unspecified: Secondary | ICD-10-CM | POA: Insufficient documentation

## 2014-07-10 DIAGNOSIS — Z720 Tobacco use: Secondary | ICD-10-CM | POA: Insufficient documentation

## 2014-07-10 DIAGNOSIS — E785 Hyperlipidemia, unspecified: Secondary | ICD-10-CM | POA: Insufficient documentation

## 2014-07-10 DIAGNOSIS — Z8673 Personal history of transient ischemic attack (TIA), and cerebral infarction without residual deficits: Secondary | ICD-10-CM | POA: Insufficient documentation

## 2014-07-10 DIAGNOSIS — K219 Gastro-esophageal reflux disease without esophagitis: Secondary | ICD-10-CM | POA: Insufficient documentation

## 2014-07-10 DIAGNOSIS — I509 Heart failure, unspecified: Secondary | ICD-10-CM | POA: Insufficient documentation

## 2014-07-10 DIAGNOSIS — I129 Hypertensive chronic kidney disease with stage 1 through stage 4 chronic kidney disease, or unspecified chronic kidney disease: Secondary | ICD-10-CM | POA: Insufficient documentation

## 2014-07-10 DIAGNOSIS — I2510 Atherosclerotic heart disease of native coronary artery without angina pectoris: Secondary | ICD-10-CM | POA: Insufficient documentation

## 2014-07-10 DIAGNOSIS — X58XXXD Exposure to other specified factors, subsequent encounter: Secondary | ICD-10-CM | POA: Insufficient documentation

## 2014-07-10 DIAGNOSIS — Z8679 Personal history of other diseases of the circulatory system: Secondary | ICD-10-CM

## 2014-07-10 DIAGNOSIS — I5042 Chronic combined systolic (congestive) and diastolic (congestive) heart failure: Secondary | ICD-10-CM | POA: Insufficient documentation

## 2014-07-10 LAB — CBC
HEMATOCRIT: 33.7 % — AB (ref 39.0–52.0)
Hemoglobin: 10 g/dL — ABNORMAL LOW (ref 13.0–17.0)
MCH: 25.4 pg — ABNORMAL LOW (ref 26.0–34.0)
MCHC: 29.7 g/dL — AB (ref 30.0–36.0)
MCV: 85.5 fL (ref 78.0–100.0)
Platelets: 307 10*3/uL (ref 150–400)
RBC: 3.94 MIL/uL — ABNORMAL LOW (ref 4.22–5.81)
RDW: 18.3 % — AB (ref 11.5–15.5)
WBC: 10.7 10*3/uL — ABNORMAL HIGH (ref 4.0–10.5)

## 2014-07-10 LAB — PROTIME-INR
INR: 2.14 — ABNORMAL HIGH (ref 0.00–1.49)
Prothrombin Time: 24.1 seconds — ABNORMAL HIGH (ref 11.6–15.2)

## 2014-07-10 MED ORDER — ENSURE COMPLETE PO LIQD: 237.0000 mL | Freq: Two times a day (BID) | ORAL | Status: AC

## 2014-07-10 MED ORDER — LEVALBUTEROL HCL 1.25 MG/0.5ML IN NEBU: 1.2500 mg | INHALATION_SOLUTION | Freq: Three times a day (TID) | RESPIRATORY_TRACT | Status: DC

## 2014-07-10 MED ORDER — OXYCODONE-ACETAMINOPHEN 5-325 MG PO TABS
1.0000 | ORAL_TABLET | Freq: Once | ORAL | Status: AC
  Administered 2014-07-10: 1 via ORAL
  Filled 2014-07-10: qty 1

## 2014-07-10 MED ORDER — PANTOPRAZOLE SODIUM 40 MG PO TBEC: 40.0000 mg | DELAYED_RELEASE_TABLET | Freq: Every day | ORAL | Status: AC

## 2014-07-10 MED ORDER — FENTANYL CITRATE 0.05 MG/ML IJ SOLN: 50.0000 ug | Freq: Once | INTRAMUSCULAR | Status: DC

## 2014-07-10 MED ORDER — ENOXAPARIN SODIUM 120 MG/0.8ML ~~LOC~~ SOLN: 1.5000 mg/kg | SUBCUTANEOUS | Status: DC

## 2014-07-10 MED ORDER — POLYETHYLENE GLYCOL 3350 17 G PO PACK: 17.0000 g | PACK | Freq: Every day | ORAL | Status: AC

## 2014-07-10 MED ORDER — NICOTINE 21 MG/24HR TD PT24: 21.0000 mg | MEDICATED_PATCH | Freq: Every day | TRANSDERMAL | Status: DC

## 2014-07-10 MED ORDER — DOCUSATE SODIUM 100 MG PO CAPS: 100.0000 mg | ORAL_CAPSULE | Freq: Two times a day (BID) | ORAL | Status: AC

## 2014-07-10 MED ORDER — WARFARIN SODIUM 1 MG PO TABS: ORAL_TABLET | ORAL | Status: DC

## 2014-07-10 MED ORDER — OXYCODONE HCL 5 MG PO TABS: 5.0000 mg | ORAL_TABLET | Freq: Four times a day (QID) | ORAL | Status: DC | PRN

## 2014-07-10 MED ORDER — ONDANSETRON HCL 4 MG/2ML IJ SOLN
4.0000 mg | Freq: Once | INTRAMUSCULAR | Status: AC
  Administered 2014-07-10: 4 mg via INTRAVENOUS
  Filled 2014-07-10: qty 2

## 2014-07-10 MED ORDER — CEPHALEXIN 250 MG PO CAPS: 250.0000 mg | ORAL_CAPSULE | Freq: Three times a day (TID) | ORAL | Status: DC

## 2014-07-10 MED ORDER — POTASSIUM CHLORIDE ER 20 MEQ PO TBCR: 20.0000 meq | EXTENDED_RELEASE_TABLET | Freq: Two times a day (BID) | ORAL | Status: AC

## 2014-07-10 MED ORDER — SACCHAROMYCES BOULARDII 250 MG PO CAPS: 250.0000 mg | ORAL_CAPSULE | Freq: Two times a day (BID) | ORAL | Status: AC

## 2014-07-10 MED ORDER — WARFARIN SODIUM 1 MG PO TABS
1.0000 mg | ORAL_TABLET | Freq: Once | ORAL | Status: DC
  Filled 2014-07-10: qty 1

## 2014-07-10 MED ORDER — CARVEDILOL 3.125 MG PO TABS: 3.1250 mg | ORAL_TABLET | Freq: Two times a day (BID) | ORAL | Status: AC

## 2014-07-10 MED ORDER — LEVALBUTEROL HCL 1.25 MG/0.5ML IN NEBU: 1.2500 mg | INHALATION_SOLUTION | Freq: Four times a day (QID) | RESPIRATORY_TRACT | Status: DC | PRN

## 2014-07-10 MED ORDER — SENNOSIDES 8.8 MG/5ML PO SYRP: 5.0000 mL | ORAL_SOLUTION | Freq: Two times a day (BID) | ORAL | Status: AC

## 2014-07-10 MED ORDER — ONDANSETRON HCL 4 MG/2ML IJ SOLN: 4.0000 mg | Freq: Once | INTRAMUSCULAR | Status: DC

## 2014-07-10 MED ORDER — DRONABINOL 5 MG PO CAPS: 5.0000 mg | ORAL_CAPSULE | Freq: Every day | ORAL | Status: DC

## 2014-07-10 MED ORDER — ACETAMINOPHEN 325 MG PO TABS: 325.0000 mg | ORAL_TABLET | Freq: Four times a day (QID) | ORAL | Status: AC

## 2014-07-10 MED ORDER — FENTANYL CITRATE 0.05 MG/ML IJ SOLN
50.0000 ug | Freq: Once | INTRAMUSCULAR | Status: AC
  Administered 2014-07-10: 50 ug via INTRAVENOUS
  Filled 2014-07-10: qty 2

## 2014-07-10 NOTE — Discharge Instructions (Signed)
°  The patient will have scheduled Tylenol for 1 week for pain control. He has been prescribed 5 mg Oxycodone tablets to be given every 6 hours as needed for severe pain.

## 2014-07-10 NOTE — Progress Notes (Signed)
EDCM received phone call from EDPA related to [facility]'s [facility] SN[facility]  [facility]

## 2014-07-10 NOTE — ED Notes (Signed)
[facility]/o [facility] hip pain.  Pt was just discharged today from here for hospitalization of left hip fracture, with non-surgical management.  Was discharged to the facility for short-term PT/OT rehab.  Pt in "excruciating pain" while in the facility.  Facility reported that they "can not give him pain medication per their "protocol" because pt was not given "prescription" on discharge.  EMS was called.  Pt presents to ED A/Ox4, with pain 7/10.

## 2014-07-10 NOTE — Progress Notes (Signed)
CSW met with pt at bedside. Wife was present. Pt confirms that he presents to [facility] due to hip pain. Pt and wife confirm that he is from [facility].  Pt stated that he is not pleased about a comment that a staff member said while at the facility. Wife states a staff member at the facility stated " You are so sick, we might as well throw you under the bus." The pt states he is not happy about that statement but does believe that the facility has good service. Wife stated that she like the facility think that they are doing a great job. Also, wife states that it is convenient for her because it is not far from their home or work.  CSW gave pt ombudsman information.   Pt stated that he wanted something to drink. CSW consulted with nurse who states that the pt is able to have something to drink. CSW fixed the pt a drink.  Willette Brace [phone] ED CSW 07/10/2014 11:16 PM

## 2014-07-10 NOTE — Progress Notes (Signed)
ANTICOAGULATION CONSULT NOTE   Pharmacy Consult for Lovenox/Warfarin Indication: pulmonary embolus  No Known Allergies  Patient Measurements: Height: 6\' 1"  (185.4 cm) Weight: 169 lb 12.1 oz (77 kg) IBW/kg (Calculated) : 79.9  Vital Signs: Temp: 98.3 F (36.8 C) (02/29 0436) Temp Source: Oral (02/29 0436) BP: 108/56 mmHg (02/29 0436) Pulse Rate: 105 (02/29 0436)  Labs:  Recent Labs  07/08/14 0411 07/09/14 0400 07/10/14 0447  HGB 10.0* 10.3* 10.0*  HCT 33.3* 33.3* 33.7*  PLT 220 268 307  LABPROT 17.9* 20.3* 24.1*  INR 1.46 1.72* 2.14*  CREATININE 1.23 1.19  --     Estimated Creatinine Clearance: 58.4 mL/min (by C-G formula based on Cr of 1.19).  Medications:  Scheduled:  . antiseptic oral rinse  7 mL Mouth Rinse q12n4p  . cephALEXin  250 mg Oral 3 times per day  . chlorhexidine  15 mL Mouth Rinse BID  . docusate sodium  100 mg Oral BID  . dronabinol  2.5 mg Oral QAC lunch  . enoxaparin (LOVENOX) injection  1.5 mg/kg Subcutaneous Q24H  . feeding supplement (ENSURE COMPLETE)  237 mL Oral BID BM  . furosemide  40 mg Oral Daily  . ipratropium  0.5 mg Nebulization TID  . levalbuterol  1.25 mg Nebulization TID  . nicotine  21 mg Transdermal Daily  . pantoprazole  40 mg Oral Daily  . polyethylene glycol  17 g Oral Daily  . potassium chloride  20 mEq Oral BID  . saccharomyces boulardii  250 mg Oral BID  . sennosides  5 mL Oral BID  . sodium chloride  3 mL Intravenous Q12H  . Warfarin - Pharmacist Dosing Inpatient   Does not apply q1800   Infusions:    PRN: sodium chloride, fentaNYL, ipratropium, levalbuterol, metoprolol, sodium chloride  Assessment: 74 yo male admitted 06/23/14 s/p fall. He was recently admitted 2/3-06/18/14 for an acute COPD exacerbation. On 2/14, he developed acute respiratory distress, pO2 below reportable range and elevated ddimer. Per MD, plan for VQ scan, LE dopplers, and Pharmacy consulted to dose IV heparin for 2/14, warfarin added 2/16.  Warfarin and heparin were stopped 2/20 due to concerns for SBO and supratherapeutic INR. Per pharmacist discussion with MD, plan  to restart heparin when INR < 3.0 which was done on 2/24.  Previous warfarin dosing and INRs reviewed - INR because supratherapeutic very quickly after only three doses:  INR 1.32 on 2/13 - 5mg  warfarin given on 2/16  INR 1.05 on 2/17 - 6mg  warfarin given on 2/17  INR 1.59 on 2/18 - 4 mg warfarin given on 2/18  INR 2.95 on 2/19 - warfarin held  INR 5.33 on 2/20 - warfarin dc'd  INR 5.73 on 2/21  INR 4.86 on 2/22  INR 4.68 on 2/23 - vitamin K given  INR 1.58 on 2/24 - IV heparin started  INR 1.43 on 2/25 - transitioned to lovenox/warfarin  Today, 07/10/2014   Day #5 Lovenox/Warfarin overlap  INR now therapeutic  CBC: hgb and plts stable  No bleeding reported  AoCKD state III - SCr decreased to 1.19, CrCl 58 ml/min  Soft diet and Ensure BID ordered -poor diet  Goal of Therapy:  INR 2-3 Monitor platelets by anticoagulation protocol: Yes   Plan:   Warfarin 1mg  PO x 1 today  Continue Lovenox 1.5 mg/kg SQ q24h  Daily PT/INR  CBC ordered daily per MD  If INR remains therapeutic tomorrow, can d/c lovenox  Per CHEST Guidelines, patient should receive a 5  day overlap of lovenox and warfarin therapy AND have an INR > or = 2.0 for 24 hours before stopping lovenox.  Haynes Hoehn, PharmD, BCPS 07/10/2014, 8:43 AM  Pager: [phone]

## 2014-07-10 NOTE — Discharge Summary (Signed)
[facility]  Physician Discharge Summary   Patient ID: Nathan [name] MRN: [id number] DOB/AGE: December 29, 1940 74 y.o.  Admit date: 06/23/2014 Discharge date: 07/10/2014  PCP: Lillia Mountain, MD  DISCHARGE DIAGNOSES:  Principal Problem:   Acute respiratory distress Active Problems:   COPD (chronic obstructive pulmonary disease), gold stage C.   Hyperlipidemia   S/P CABG (coronary artery bypass graft)   Chronic kidney disease (CKD), stage III (moderate)   Tobacco use disorder   PVD (peripheral vascular disease)   Essential hypertension   PAF (paroxysmal atrial fibrillation)   CAD (coronary artery disease)   Chronic combined systolic and diastolic CHF (congestive heart failure)   Anemia   Hypertensive heart disease   Right hip pain   Fall   Ear lobe laceration   Hip pain, acute   Acute hip pain   Acute on chronic systolic CHF (congestive heart failure)   Dislocation of hip, obturator, right, closed   Sacral fracture, closed   Pulmonary embolus: Probable   DNAR (do not attempt resuscitation)   Palliative care status   Abdominal distension   Respiratory distress   Dyspnea   Palliative care encounter   Acute delirium   Protein-calorie malnutrition, severe   Ileus   Weakness generalized   DNR (do not resuscitate)   RECOMMENDATIONS FOR OUTPATIENT FOLLOW UP:  1. Continue oxygen at 2-3 L/m by nasal cannula 2. Remove Foley catheter on Wednesday or Thursday once he is is a little bit more mobile. 3. Check PT/INR tomorrow, that is March 1. If INR is between 2 and 3. He can discontinue Lovenox. He will be given a dose of Lovenox today prior to discharge.  4. Check Bmet on 07/13/14 for potassium levels.  5. Activity restrictions: nonweightbearing right lower extremity and touch touchdown weightbearing left lower extremity.  6. Get palliative medicine consult at SNF for ongoing care.  DISCHARGE CONDITION: fair  Diet recommendation: Soft diet as tolerated. May  advance.  Filed Weights   07/01/14 0500 07/01/14 1353 07/02/14 0700  Weight: 78.5 kg (173 lb 1 oz) 79 kg (174 lb 2.6 oz) 77 kg (169 lb 12.1 oz)    INITIAL HISTORY: 74 y.o. male with extensive CAD, COPD with ongoing tobacco abuse, chronic CHF, HTN, peripheral vascular disease, GERD, CKD stage III, recently hospitalized from 06/14/2014 to 06/18/2014 for an acute COPD exacerbation, presented back to [facility] ED on 2/12 after an episode of fall that occurred several hours prior to this admission while he was trying to go to the bathroom. He stood up and his right leg gave away and he fell on his right side, subsequently unable to stand up and bear weight.   Major events since admission: 2/13 - MRI right hip with non displaced ring obturator fracture, conservative mngt per ortho  2/14 - dyspnea, lasix started as CXR with more vascular congestion, transferred to SDU  2/15 - VQ scan with intermediate prob PE, heparin drip started with Coumadin bridging  2/17 - developed sudden onset a-fib  2/20 - lethargy, hypoxia, oxygen sat 91% on 5 L, abd distension, ? SBO, transfer to ICU, PCCM consulted, ABX XRAY pending  2/21 - still lethargic, currently on BIPAP, started Zosyn and Vanc, overnight as pct > 10, palliative care team consulted  2/22 - transferred to medical unit, stable and doing better, off BiPAP, abd XRAY with persistent ileus, pt refusing NGT 2/23 - no BM since 2/20, pt asking to eat but aware of ileus, abd more distended this AM, still  refusing NGT  2/24 - ileus has improved. Started on diet. He is tolerating. Oxygen requirements improved as well.  Consultations: Cardiology - signed off Palliative medicine PCCM - signed off   Procedures: 2D ECHO Study Conclusions - Left ventricle: LVEF is approximately 40% with diffusehypokinesis, worse in the inferior and posterior wall.. Wallthickness was increased in a pattern of mild LVH. - Mitral valve: There was moderate regurgitation. - Left  atrium: The atrium was moderately dilated. - Right ventricle: Systolic function was mildly reduced. - Tricuspid valve: There was moderate regurgitation. - Pulmonary arteries: PA peak pressure: 34 mm Hg (S).  HOSPITAL COURSE:   Acute respiratory failure with hypoxia - This is multifactorial and secondary to acute on chronic systolic CHF, PE, COPD (GOLD stage III) - Patient has made a significant improvement. He is now only on nasal cannula.  - will also continue bronchodilators scheduled and as needed  - Anticoagulation has been resumed. Continue Lasix. Monitor renal function closely.  Colonic ileus with Abdominal distention - This has improved. Patient tolerating diet. He had numerous bowel movements after his ileus resolved. C. difficile was negative. Continue probiotics. Avoid antidiarrheal agents. Continue bowel regimen. Avoid narcotics as much as possible.   Sepsis, severe, 2/20, E. Coli UTI Resolved - Initially HR in 110 - 120's with RR in 30's, pct > 10 and 23, zosyn and vanc started 2/20. This was changed over to Keflex. We'll continue for 4 more days. - please note that blood cultures from 2/15 and again from 2/21 so far negative to date   Acute on chronic systolic heart failure - 2 D ECHO this admission with EF 40% - He was given intravenous Lasix and then changed over to oral Lasix. He is much improved. Renal function is stable. Potassium levels will need to be checked periodically at the skilled nursing facility.  Intermediate probability of pulmonary emboli This was detected on VQ scan. After discussions with specialists he was started on anticoagulation. Initially he was on IV heparin. Then he was switched over to Lovenox and warfarin. INR is therapeutic today. If it is therapeutic again tomorrow. Lovenox can be discontinued. Will need to be treated for at least 6 months and then to be determined by his doctor.  A-fib with RVR - Stable. Resume his beta blocker at a lower  dose.  Acute encephalopathy, worse 2/20, from sepsis UTI E. Coli, COPD, acute systolic CHF  - Patient is improved and close to baseline  CAD status post CABG/acute on chronic systolic heart failure Stable.  Acute on chronic kidney disease stage III Initially, creatinine was elevated. Now it's back to baseline.  Hypernatremia This was likely due to Lasix. Now improved.  Leukocytosis Resolved  Tobacco abuse - nicotine patch   Anemia of chronic disease, thrombocytopenia - no signs of active bleeding, Hg relatively stable  - Plt count stable   Right obturator ring fracture/right sacral and left fractures/bilateral hip girdle muscular strain/Right hip pain - nondisplaced right-sided pelvic ring disruption  - should be a self-limited problem. Activity restrictions as stated above. - per ortho rec's, will need follow-up with Dr. August Saucer in 3 weeks for repeat radiographs to make sure everything is healing  Ear lobe laceration - Status post repair  Severe protein calorie malnutrition  - in the context of acute illness. Now on diet but reports poor appetite. He was started on appetite stimulants. Continue to monitor at SNF.   Skin breakdown sacral area Wound care consult was obtained. Barrier cream to  be utilized. Continue wound assessments.  Patient was also seen by palliative care since at one point he was declining. But then he started turning around. It may be beneficial to have palliative medicine follow him at the skilled nursing facility.  Overall improved. Still tenuous. Okay to go to skilled nursing facility. Discussed with the patient and his wife today.  PERTINENT LABS:  The results of significant diagnostics from this hospitalization (including imaging, microbiology, ancillary and laboratory) are listed below for reference.    Microbiology: Recent Results (from the past 240 hour(s))  Culture, blood (routine x 2)     Status: None   Collection Time: 07/02/14  9:15  AM  Result Value Ref Range Status   Specimen Description BLOOD RIGHT ARM  Final   Special Requests   Final    BOTTLES DRAWN AEROBIC AND ANAEROBIC 10CC BOTH BOTTLES   Culture   Final    NO GROWTH 5 DAYS Performed at [facility]    Report Status 07/08/2014 FINAL  Final  Culture, blood (routine x 2)     Status: None   Collection Time: 07/02/14  9:21 AM  Result Value Ref Range Status   Specimen Description BLOOD RIGHT ARM  Final   Special Requests   Final    BOTTLES DRAWN AEROBIC AND ANAEROBIC 10CC BOTH BOTTLES   Culture   Final    NO GROWTH 5 DAYS Performed at [facility]    Report Status 07/08/2014 FINAL  Final  Culture, Urine     Status: None   Collection Time: 07/02/14 10:04 AM  Result Value Ref Range Status   Specimen Description URINE, CATHETERIZED  Final   Special Requests NONE  Final   Colony Count   Final    >=100,000 COLONIES/ML Performed at [facility]    Culture   Final    ESCHERICHIA COLI Performed at [facility]    Report Status 07/04/2014 FINAL  Final   Organism ID, Bacteria ESCHERICHIA COLI  Final      Susceptibility   Escherichia coli - MIC*    AMPICILLIN RESISTANT      CEFAZOLIN <=4 SENSITIVE Sensitive     CEFTRIAXONE <=1 SENSITIVE Sensitive     CIPROFLOXACIN >=4 RESISTANT Resistant     GENTAMICIN >=16 RESISTANT Resistant     LEVOFLOXACIN >=8 RESISTANT Resistant     NITROFURANTOIN <=16 SENSITIVE Sensitive     TOBRAMYCIN 8 INTERMEDIATE Intermediate     TRIMETH/SULFA >=320 RESISTANT Resistant     PIP/TAZO <=4 SENSITIVE Sensitive     * ESCHERICHIA COLI  Clostridium Difficile by PCR     Status: None   Collection Time: 07/06/14 11:45 PM  Result Value Ref Range Status   C difficile by pcr NEGATIVE NEGATIVE Final    Comment: Performed at [facility]     Labs: Basic Metabolic Panel:  Recent Labs Lab 07/04/14 0418 07/05/14 0417 07/06/14 0406 07/08/14 0411 07/09/14 0400  NA 151* 145 141 139 134*  K 4.1  3.3* 3.0* 3.9 4.2  CL 110 105 107 103 100  CO2 31 32 28 28 27   GLUCOSE 109* 105* 144* 88 94  BUN 109* 89* 64* 28* 25*  CREATININE 2.36* 1.98* 1.69* 1.23 1.19  CALCIUM 8.3* 7.8* 7.6* 7.8* 7.7*  MG 2.9* 2.7*  --   --   --   PHOS 4.5 2.9  --   --   --    CBC:  Recent Labs Lab 07/06/14 0406 07/07/14 0521  07/08/14 0411 07/09/14 0400 07/10/14 0447  WBC 5.8 6.5 7.4 10.1 10.7*  HGB 10.0* 9.5* 10.0* 10.3* 10.0*  HCT 33.3* 31.9* 33.3* 33.3* 33.7*  MCV 84.1 84.2 84.1 84.3 85.5  PLT 138* 163 220 268 307   BNP: BNP (last 3 results)  Recent Labs  06/25/14 0201 06/26/14 0357 07/02/14 0410  BNP 539.7* 626.8* 703.3*    IMAGING STUDIES  Ct Head Wo Contrast  06/23/2014   CLINICAL DATA:  Larey Seat about 8 hr ago now with injury to the right side of the head and ear.  EXAM: CT HEAD WITHOUT CONTRAST  TECHNIQUE: Contiguous axial images were obtained from the base of the skull through the vertex without intravenous contrast.  COMPARISON:  09/19/2011; 09/11/2011  FINDINGS: Similar findings of atrophy with diffuse sulcal prominence, centralized volume loss and mild commensurate expected dilatation of the ventricular system. Scattered periventricular hypodensities are unchanged compatible with microvascular ischemic disease. The gray-white differentiation is otherwise well maintained without CT evidence of acute large territory infarct. No intraparenchymal or extra-axial mass or hemorrhage. Unchanged size and configuration of the ventricles and basilar cisterns. No midline shift. Intracranial atherosclerosis. Limited visualization of the paranasal sinuses and mastoid air cells is normal. No air-fluid levels. Regional soft tissues appear normal. Post left-sided cataract surgery.  IMPRESSION: Atrophy and microvascular ischemic disease without acute intracranial process.   Electronically Signed   By: Simonne Come M.D.   On: 06/23/2014 15:01   Mr Hip Right Wo Contrast  06/24/2014   CLINICAL DATA:  RIGHT hip pain.   Recent fall.  Occult fracture.  EXAM: MR OF THE RIGHT HIP WITHOUT CONTRAST  TECHNIQUE: Multiplanar, multisequence MR imaging was performed. No intravenous contrast was administered.  COMPARISON:  Radiographs 06/23/2014.  FINDINGS: Nondisplaced RIGHT obturator ring fractures are present. Fracture in the mid RIGHT inferior pubic ramus. There is also a fracture of the root of the RIGHT superior pubic ramus. Additionally, there is a complementary RIGHT sacral ala fracture. All of these fractures are nondisplaced and or radiographically occult. Anasarca is present with diffuse subcutaneous edema. LEFT gluteal muscular edema compatible with strain. There is also bilateral RIGHT-greater-than-LEFT adductor compartment strain and reactive edema. Gluteal tendons appear intact. Edema is present in the RIGHT obturator internus and externus consistent with strain and reactive edema from adjacent fractures.  There is no proximal femur fracture. No hip effusion. Negative for bursitis. LEFT inguinal hernia contains a portion of the sigmoid colon.  IMPRESSION: 1. Nondisplaced RIGHT obturator ring fractures. Complementary nondisplaced RIGHT sacral ala fracture. 2. Bilateral hip girdle muscular strain. 3. Anasarca up. 4. LEFT inguinal hernia containing a loop of sigmoid colon.   Electronically Signed   By: Andreas Newport M.D.   On: 06/24/2014 08:26   Nm Pulmonary Perf And Vent  06/26/2014   CLINICAL DATA:  Respiratory distress  EXAM: NUCLEAR MEDICINE VENTILATION - PERFUSION LUNG SCAN  TECHNIQUE: Ventilation images were obtained in multiple projections using inhaled aerosol technetium 99 M DTPA. Perfusion images were obtained in multiple projections after intravenous injection of Tc-2m MAA.  RADIOPHARMACEUTICALS:  Fifty mCi Tc-24m DTPA aerosol and 5.5 mCi Tc-80m MAA  COMPARISON:  Chest x-ray from the same day.  FINDINGS: There are 2 peripheral and wedge-shaped defects in the right lower lobe, 1 large and the other moderate,  located in the posterior and apical segments respectively. The largest is triple matched based on chest x-ray from today; the ventilation defect is larger in the apical defect. Poor ventilation in the left lower lobe,  with better perfusion, correlating with pleural fluid and airspace disease on preceding chest x-ray. There could be a small mismatched defect in the lingula based on left lateral imaging. The lung contours are altered in the setting of bilateral pleural effusions.  IMPRESSION: Intermediate probability for pulmonary embolism by PIOPED II. Bibasilar airspace disease and pleural effusions significantly limits the utility of V/Q scan.   Electronically Signed   By: Marnee Spring M.D.   On: 06/26/2014 12:00   Dg Chest Port 1 View  07/07/2014   CLINICAL DATA:  Dyspnea, history of COPD, CHF, CABG and previous tobacco use.  EXAM: PORTABLE CHEST - 1 VIEW  COMPARISON:  Chest dominant from an acute abdominal series of July 03, 2014  FINDINGS: The lungs are hyperinflated. The interstitial markings are increased bilaterally. There is a moderate-sized right pleural effusion and small left pleural effusion. The cardiac silhouette is top-normal in size. The patient has undergone previous CABG. The pulmonary vascularity is prominent centrally with mild cephalization.  IMPRESSION: COPD and CHF with bilateral pleural effusions. This has not significantly changed since the previous study.   Electronically Signed   By: David  [place]   On: 07/07/2014 08:31   Dg Chest Port 1 View  07/02/2014   CLINICAL DATA:  Dyspnea  EXAM: PORTABLE CHEST - 1 VIEW  COMPARISON:  07/01/2014  FINDINGS: There is a nasogastric tube with tip in the stomach but side-port at the EG junction.  There is moderate cardiomegaly, unchanged. There are airspace opacities in the central and basilar regions bilaterally. There are a effusions, right greater than left.  IMPRESSION: Nasogastric tube tip in the stomach, with side-port in the EG  junction. This should be advanced for optimal placement.  Central and basilar airspace opacities without significant interval change. There also are unchanged pleural effusions.   Electronically Signed   By: Ellery Plunk M.D.   On: 07/02/2014 04:27   Dg Chest Port 1 View  06/26/2014   CLINICAL DATA:  Dyspnea  EXAM: PORTABLE CHEST - 1 VIEW  COMPARISON:  06/25/2014  FINDINGS: There is unchanged cardiomegaly. There is improvement, with less confluent consolidation in the lateral left lung although ground-glass opacities persist bilaterally. There is no change in the moderate right pleural effusion.  IMPRESSION: Slight improvement in the left lung consolidation. Persistent edema and right pleural effusion.   Electronically Signed   By: Ellery Plunk M.D.   On: 06/26/2014 06:14   Dg Chest Port 1 View  06/25/2014   CLINICAL DATA:  Patient with shortness of breath. History of coronary artery disease and hypertension.  EXAM: PORTABLE CHEST - 1 VIEW  COMPARISON:  06/24/2014  FINDINGS: Multiple monitoring leads overlie the patient. The left costophrenic angle is excluded from view. Stable enlarged cardiac and mediastinal contours status post median sternotomy and CABG procedure. Bilateral mid and lower lung heterogeneous pulmonary opacities, left greater than right. Small to moderate right pleural effusion. Probable layering left pleural effusion, although the left lung base is excluded from view. No definite pneumothorax.  IMPRESSION: Bilateral mid to lower lung heterogeneous opacities, left greater than right, with associated moderate right and probable small left pleural effusions. Findings may represent pulmonary edema, infection or underlying atelectasis.   Electronically Signed   By: Annia Belt M.D.   On: 06/25/2014 12:20   Dg Chest Port 1 View  06/24/2014   CLINICAL DATA:  Congestive heart failure. Coronary artery disease. COPD. Hypertension. Atrial flutter and fibrillation.  EXAM: PORTABLE CHEST - 1  VIEW  COMPARISON:  06/14/2014  FINDINGS: Moderate cardiomegaly is stable. Small pleural effusions are seen bilaterally which has decreased in size since previous study. Decreased atelectasis also noted in the right lung base. Prior CABG again noted.  IMPRESSION: Decreased small bilateral pleural effusions and right basilar atelectasis.  Stable cardiomegaly.   Electronically Signed   By: Myles RosenthalJohn  Stahl M.D.   On: 06/24/2014 17:08   Dg Abd 2 Views  07/04/2014   CLINICAL DATA:  Ileus, abdominal distention  EXAM: ABDOMEN - 2 VIEW  COMPARISON:  07/03/2014 .  FINDINGS: similar diffuse gaseous distention of the colon compatible with an ileus. No interval change in the degree of distention. No free air evident on the decubitus view. Vascular stents noted in iliac vessels. No acute osseous finding or abnormal calcification  IMPRESSION: No interval change in the colonic ileus.   Electronically Signed   By: Judie PetitM.  Shick M.D.   On: 07/04/2014 21:06   Dg Abd Acute W/chest  07/03/2014   CLINICAL DATA:  Follow up small bowel obstruction - pt states continued discomfort but some better - shortness of breath cardiac stents placed 2007 followed by VVS - CABG 2013  EXAM: ACUTE ABDOMEN SERIES (ABDOMEN 2 VIEW & CHEST 1 VIEW)  COMPARISON:  07/02/2014 and 07/01/2014  FINDINGS: Colonic dilation with air-fluid levels is similar to the prior exam. Air is seen in the rectum and sigmoid colon. There is no small bowel dilation. No evidence of a small bowel obstruction.  No free air is seen.  Frontal chest radiograph shows hazy airspace and interstitial opacities and bilateral effusions also similar to the prior exams.  IMPRESSION: 1. Diffuse colonic dilation with air-fluid levels. There are no findings to suggest a small bowel obstruction. Findings likely due to a diffuse colonic adynamic ileus. Distal colonic obstruction is also possible. 2. No free air. 3. Lung findings consistent with congestive heart failure and bilateral pleural effusions.  4. No significant change in the appearance of the chest or abdomen when compared to the study dated 07/01/2014.   Electronically Signed   By: Amie Portlandavid  Ormond M.D.   On: 07/03/2014 12:36   Dg Abd Acute W/chest  07/01/2014   CLINICAL DATA:  Dyspnea. CHF Coronary artery disease, hypertension and COPD.  EXAM: ACUTE ABDOMEN SERIES (ABDOMEN 2 VIEW & CHEST 1 VIEW)  COMPARISON:  06/26/2014  FINDINGS: The heart size is enlarged. There are bilateral pleural effusions noted right greater than left. Bilateral airspace opacities are noted which appear unchanged from previous exam. Marked colonic distention is identified. There is gas identified throughout the colon up to the level of the rectum. No free intraperitoneal air are identified.  IMPRESSION: 1. Marked colonic distention which may reflect distal bowel obstruction or colonic ileus. No free air noted. 2. Congestive heart failure and bilateral airspace opacities.   Electronically Signed   By: Signa Kellaylor  Stroud M.D.   On: 07/01/2014 10:30   Dg Hip Unilat  With Pelvis 2-3 Views Right  06/23/2014   CLINICAL DATA:  Status post fall today while getting out of bed; right hip pain  EXAM: RIGHT HIP (WITH PELVIS) 2-3 VIEWS  COMPARISON:  Supine abdominal film of January 04, 2014 which includes a portion of the hips  FINDINGS: The bony pelvis is osteopenic. No acute pelvic fracture is demonstrated. The hip joint spaces are preserved. The observed portions of the sacrum are normal. There are common iliac stents bilaterally.  AP and frog-leg lateral views of the right hip reveal the joint space to  be normal. The acetabulum is intact. The femoral head, neck, and intertrochanteric regions are normal in appearance.  IMPRESSION: There is no acute bony abnormality of the right hip.   Electronically Signed   By: David  [place]   On: 06/23/2014 14:05    DISCHARGE EXAMINATION: Filed Vitals:   07/09/14 1936 07/09/14 2044 07/10/14 0436 07/10/14 0924  BP:  114/62 108/56   Pulse:  100 105     Temp:  99.5 F (37.5 C) 98.3 F (36.8 C)   TempSrc:  Oral Oral   Resp:  22 20   Height:      Weight:      SpO2: 91% 100% 100% 95%   General appearance: alert, cooperative, appears stated age and no distress Resp: Decreased air entry at the bases. No wheezing. No rhonchi. Cardio: irregular rhythm, no murmur, click, rub or gallop GI: soft, non-tender; bowel sounds normal; no masses,  no organomegaly  DISPOSITION: SNF [facility]  Discharge Instructions    Call MD for:  difficulty breathing, headache or visual disturbances    Complete by:  As directed      Call MD for:  extreme fatigue    Complete by:  As directed      Call MD for:  persistant dizziness or light-headedness    Complete by:  As directed      Call MD for:  persistant nausea and vomiting    Complete by:  As directed      Call MD for:  severe uncontrolled pain    Complete by:  As directed      Call MD for:  temperature >100.4    Complete by:  As directed      Diet - low sodium heart healthy    Complete by:  As directed      Discharge instructions    Complete by:  As directed   Remove Foley on 3/2 or 3/3 depending on his mobility. Check PT/INR on 3/1. Stop Lovenox if INR is between 2 and 3 on 3/1. Oxygen by nasal canula at 2-3 lpm. Check Bmet on 07/13/14 for potassium levels. Activity restrictions: nonweightbearing right lower extremity and touch touchdown weightbearing left lower extremity. Get palliative medicine consult at SNF for ongoing care.     Increase activity slowly    Complete by:  As directed            ALLERGIES: No Known Allergies   Current Discharge Medication List    START taking these medications   Details  cephALEXin (KEFLEX) 250 MG capsule Take 1 capsule (250 mg total) by mouth every 8 (eight) hours. For 4 more days.    docusate sodium (COLACE) 100 MG capsule Take 1 capsule (100 mg total) by mouth 2 (two) times daily. Qty: 10 capsule, Refills: 0    dronabinol (MARINOL) 5 MG capsule Take  1 capsule (5 mg total) by mouth daily before lunch.    enoxaparin (LOVENOX) 120 MG/0.8ML injection Inject 0.77 mLs (115 mg total) into the skin daily. Will be given a dose in hospital on 2/29 prior to discharge. Check PT/INR on 3/1 and if between 2 and 3, can stop Lovenox and just continue warfarin. Qty: 0 Syringe    feeding supplement, ENSURE COMPLETE, (ENSURE COMPLETE) LIQD Take 237 mLs by mouth 2 (two) times daily between meals.    !! levalbuterol (XOPENEX) 1.25 MG/0.5ML nebulizer solution Take 1.25 mg by nebulization every 6 (six) hours as needed for wheezing or shortness of breath. Qty: 1  each, Refills: 12    !! levalbuterol (XOPENEX) 1.25 MG/0.5ML nebulizer solution Take 1.25 mg by nebulization 3 (three) times daily. Qty: 1 each, Refills: 12    nicotine (NICODERM CQ - DOSED IN MG/24 HOURS) 21 mg/24hr patch Place 1 patch (21 mg total) onto the skin daily. Qty: 28 patch, Refills: 0    pantoprazole (PROTONIX) 40 MG tablet Take 1 tablet (40 mg total) by mouth daily.    polyethylene glycol (MIRALAX / GLYCOLAX) packet Take 17 g by mouth daily. Qty: 14 each, Refills: 0    potassium chloride 20 MEQ TBCR Take 20 mEq by mouth 2 (two) times daily.    saccharomyces boulardii (FLORASTOR) 250 MG capsule Take 1 capsule (250 mg total) by mouth 2 (two) times daily.    sennosides (SENOKOT) 8.8 MG/5ML syrup Take 5 mLs by mouth 2 (two) times daily. Qty: 240 mL, Refills: 0    warfarin (COUMADIN) 1 MG tablet Take  on 07/10/14. Further doses to be determined by INR on 07/11/14.     !! - Potential duplicate medications found. Please discuss with provider.    CONTINUE these medications which have CHANGED   Details  carvedilol (COREG) 3.125 MG tablet Take 1 tablet (3.125 mg total) by mouth 2 (two) times daily with a meal.      CONTINUE these medications which have NOT CHANGED   Details  atorvastatin (LIPITOR) 10 MG tablet Take 1 tablet (10 mg total) by mouth daily at 6 PM. Qty: 30 tablet,  Refills: 1    buPROPion (WELLBUTRIN XL) 300 MG 24 hr tablet Take 300 mg by mouth daily.    colchicine 0.6 MG tablet Take 0.6 mg by mouth daily.    furosemide (LASIX) 40 MG tablet Take 1 tablet (40 mg total) by mouth daily. Qty: 30 tablet, Refills: 5    mometasone-formoterol (DULERA) 100-5 MCG/ACT AERO Inhale 2 puffs into the lungs 2 (two) times daily. Qty: 1 Inhaler, Refills: 0    tiotropium (SPIRIVA) 18 MCG inhalation capsule Place 1 capsule (18 mcg total) into inhaler and inhale daily. Qty: 30 capsule, Refills: 12    nitroGLYCERIN (NITROSTAT) 0.4 MG SL tablet Place 0.4 mg under the tongue every 5 (five) minutes as needed for chest pain.      STOP taking these medications     albuterol (PROVENTIL HFA;VENTOLIN HFA) 108 (90 BASE) MCG/ACT inhaler      albuterol (PROVENTIL) (2.5 MG/3ML) 0.083% nebulizer solution      aspirin 81 MG chewable tablet      eplerenone (INSPRA) 25 MG tablet      isosorbide mononitrate (IMDUR) 30 MG 24 hr tablet      Melatonin 10 MG TABS        Follow-up Information    Follow up with Cammy Copa, MD In 3 weeks.   Specialty:  Orthopedic Surgery   Why:  for follow up of pelvic fracture. will need repeat films   Contact information:   [address] [place] [address] [phone]       Follow up with Lillia Mountain, MD. Schedule an appointment as soon as possible for a visit in 2 weeks.   Specialty:  Internal Medicine   Why:  post hospitalization follow up   Contact information:   [address], [address] [place] [address] [phone]       TOTAL DISCHARGE TIME: 35 minutes  [facility]  [facility] Pager [phone]  07/10/2014, 10:53 AM

## 2014-07-10 NOTE — Progress Notes (Signed)
Patient discharged to SNF, copies of all discharge medications and instructions sent to facility with [facility] transport. Report called to receiving nurse.

## 2014-07-10 NOTE — Progress Notes (Signed)
CSW continuing to follow.   Per MD, pt medically ready for discharge today.   CSW spoke to [facility] to confirm that pt insurance authorization through [employer] is still valid and CSW sent updated PT note from today.   CSW facilitated pt discharge needs including contacting facility, faxing pt discharge information via [facility], discussing with pt and pt wife at bedside, providing RN phone number to call report, and arranging ambulance transport via [facility] for pt to [facility].  Pt and pt wife coping appropriately with transition to [facility]. Pt wife has become more comfortable with the plan as last week pt wife was unsure if [facility] would be able to provide the care that pt needed. Pt and pt wife hopeful to see improvement at [facility].   No further social work needs identified at this time.   CSW signing off.  Nathan SpecterSuzanna [name], MSW, LCSW Clinical Social Work [phone]

## 2014-07-10 NOTE — ED Notes (Signed)
Bed: WA07 Expected date: 07/10/14 Expected time: 8:14 PM Means of arrival: Ambulance Comments: Pain, s/p hip fracture

## 2014-07-10 NOTE — ED Provider Notes (Signed)
CSN: [id number]     Arrival date & time 07/10/14  2036 History   First MD Initiated Contact with Patient 07/10/14 2111     Chief Complaint  Patient presents with  . Hip Pain     (Consider location/radiation/quality/duration/timing/severity/associated sxs/prior Treatment) HPI    PCP: Lillia MountainGRIFFIN,JOHN JOSEPH, MD Blood pressure 126/54, pulse 60, temperature 98 F (36.7 C), temperature source Oral, resp. rate 20, SpO2 97 %.  Nathan [name] is a 74 y.o.male with a significant PMH multiple chronic medical issues presents to the ER with complaints of hip pain. The patient fractured his hip a few weeks ago and was discharged to [facility]. At discharge he was not prescribed any pain medication and the patient has been very uncomfortable and in severe pain today. He does not want to go back to the rehab center due to not liking the food and concern that the staff is not kind. Otherwise he denies medical concerns different from baseline. His family member report he is at baseline.   Past Medical History  Diagnosis Date  . Aorto-iliac disease     a. s/p bilat with stent 2007 - followed by [facility].  . CAD (coronary artery disease)     a. 10/2011 s/p CABG x4 (LIMA-LAD, SVG-Diag, SVG-OM1 and SVG-PDA)  b. 03/2012 NSTEMI s/p LHC SVG-RCA and LIMA-LAD but total occlusion of SVG-OM as well as SVG-diag. RX therapy recommended   . Hypertension   . ED (erectile dysfunction)   . COPD, severe   . Hyperlipidemia   . Stroke   . GERD (gastroesophageal reflux disease)   . Gout   . Paroxysmal atrial flutter     ablation  . Chronic combined systolic and diastolic CHF (congestive heart failure)     a. Last echo 04/2014: EF 40-45%.  Marland Kitchen. PAF (paroxysmal atrial fibrillation)   . CKD (chronic kidney disease), stage III   . Tobacco abuse   . History of noncompliance with medical treatment   . Carotid artery disease     a. RCEA 2013 - followed by [facility].  . Anemia   . Lower GI bleed     a.  LGIB 10/2012: Colonoscopy performed June 21 showed blood in the colon but no definite source identified, diverticular versus AVM suspected. Aspirin stopped.  . Mitral regurgitation   . Hypertensive heart disease    Past Surgical History  Procedure Laterality Date  . Spinal fusion    . Hand surgery    . Neck fusion  1975  . Eye surgery    . Coronary artery bypass graft  10/31/2011    Procedure: CORONARY ARTERY BYPASS GRAFTING (CABG);  Surgeon: Delight OvensEdward B Gerhardt, MD;  [facility];  Service: Open Heart Surgery;  Laterality: N/A;  times three using  Left Internal Mammary Artery and Right Greater Saphenous Vein Graft harvested endoscopically; TEE  . Endarterectomy  10/31/2011    Procedure: ENDARTERECTOMY CAROTID;  Surgeon: Nada LibmanVance W Brabham, MD;  [facility];  Service: Vascular;  Laterality: Right;  with patch angioplasty   . Carotid endarterectomy  10-31-2011    right  . Colonoscopy N/A 10/30/2012    Procedure: COLONOSCOPY;  Surgeon: Shirley FriarVincent C. Schooler, MD;  [facility];  Service: Endoscopy;  Laterality: N/A;  . Left heart catheterization with coronary angiogram N/A 09/04/2011    Procedure: LEFT HEART CATHETERIZATION WITH CORONARY ANGIOGRAM;  Surgeon: Lesleigh NoeHenry W Smith III, MD;  [facility];  Service: Cardiovascular;  Laterality: N/A;  .  Left heart cath N/A 04/03/2012    Procedure: LEFT HEART CATH;  Surgeon: Lennette Bihari, MD;  Location: [facility] C[facility] LAB;  Service: Cardiovascular;  Laterality: N/A;   Family History  Problem Relation Age of Onset  . Cancer Mother   . Tuberculosis Mother   . Heart disease Father     He did NOT have it before age 47   History  Substance Use Topics  . Smoking status: Current Some Day Smoker -- 1.00 packs/day for 64 years    Types: Cigarettes  . Smokeless tobacco: Never Used  . 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     Allergies  Review of patient's allergies indicates no known allergies.  Home Medications   Prior to Admission medications   Medication Sig Start Date End Date Taking? Authorizing Provider  atorvastatin (LIPITOR) 10 MG tablet Take 1 tablet (10 mg total) by mouth daily at 6 PM. 01/05/14  Yes Jeralyn Bennett, MD  buPROPion (WELLBUTRIN XL) 300 MG 24 hr tablet Take 300 mg by mouth daily.   Yes Historical Provider, MD  carvedilol (COREG) 3.125 MG tablet Take 1 tablet (3.125 mg total) by mouth 2 (two) times daily with a meal. 07/10/14  Yes Osvaldo Shipper, MD  cephALEXin (KEFLEX) 250 MG capsule Take 1 capsule (250 mg total) by mouth every 8 (eight) hours. For 4 more days. 07/10/14  Yes Osvaldo Shipper, MD  colchicine 0.6 MG tablet Take 0.6 mg by mouth daily.   Yes Historical Provider, MD  docusate sodium (COLACE) 100 MG capsule Take 1 capsule (100 mg total) by mouth 2 (two) times daily. 07/10/14  Yes Osvaldo Shipper, MD  dronabinol (MARINOL) 5 MG capsule Take 1 capsule (5 mg total) by mouth daily before lunch. 07/10/14  Yes Osvaldo Shipper, MD  enoxaparin (LOVENOX) 120 MG/0.8ML injection Inject 0.77 mLs (115 mg total) into the skin daily. Will be given a dose in hospital on 2/29 prior to discharge. Check PT/INR on 3/1 and if between 2 and 3, can stop Lovenox and just continue warfarin. 07/11/14  Yes Osvaldo Shipper, MD  feeding supplement, ENSURE COMPLETE, (ENSURE COMPLETE) LIQD Take 237 mLs by mouth 2 (two) times daily between meals. 07/10/14  Yes Osvaldo Shipper, MD  furosemide (LASIX) 40 MG tablet Take 1 tablet (40 mg total) by mouth daily. 06/20/14  Yes Lyn Records III, MD  levalbuterol Pauline Aus) 1.25 MG/0.5ML nebulizer solution Take 1.25 mg by nebulization every 6 (six) hours as needed for wheezing or shortness of breath. 07/10/14  Yes Osvaldo Shipper, MD  mometasone-formoterol (DULERA) 100-5 MCG/ACT AERO Inhale 2  puffs into the lungs 2 (two) times daily. 06/18/14  Yes Rhetta Mura, MD  nicotine (NICODERM CQ - DOSED IN MG/24 HOURS) 21 mg/24hr patch Place 1 patch (21 mg total) onto the skin daily. 07/10/14  Yes Osvaldo Shipper, MD  nitroGLYCERIN (NITROSTAT) 0.4 MG SL tablet Place 0.4 mg under the tongue every 5 (five) minutes as needed for chest pain.   Yes Historical Provider, MD  pantoprazole (PROTONIX) 40 MG tablet Take 1 tablet (40 mg total) by mouth daily. 07/10/14  Yes Osvaldo Shipper, MD  polyethylene glycol (MIRALAX / GLYCOLAX) packet Take 17 g by mouth daily. 07/10/14  Yes Osvaldo Shipper, MD  potassium chloride 20 MEQ TBCR Take 20 mEq by mouth 2 (two) times daily. 07/10/14  Yes Osvaldo Shipper, MD  saccharomyces boulardii (FLORASTOR) 250 MG capsule Take 1 capsule (250 mg total) by mouth 2 (two) times daily. 07/10/14  Yes Osvaldo Shipper, MD  sennosides (SENOKOT) 8.8 MG/5ML syrup Take 5 mLs by mouth 2 (two) times daily. 07/10/14  Yes Osvaldo Shipper, MD  tiotropium (SPIRIVA) 18 MCG inhalation capsule Place 1 capsule (18 mcg total) into inhaler and inhale daily. 06/18/14  Yes Rhetta Mura, MD  warfarin (COUMADIN) 1 MG tablet Take  on 07/10/14. Further doses to be determined by INR on 07/11/14. 07/10/14  Yes Osvaldo Shipper, MD  acetaminophen (TYLENOL) 325 MG tablet Take 1 tablet (325 mg total) by mouth every 6 (six) hours. 07/10/14   Nathan Bembenek Irine Seal, PA-C  levalbuterol (XOPENEX) 1.25 MG/0.5ML nebulizer solution Take 1.25 mg by nebulization 3 (three) times daily. 07/10/14   Osvaldo Shipper, MD  oxyCODONE (OXY IR/ROXICODONE) 5 MG immediate release tablet Take 1 tablet (5 mg total) by mouth every 6 (six) hours as needed for severe pain. 07/10/14   Nathan Bernardi Irine Seal, PA-C   BP 126/54 mmHg  Pulse 60  Temp(Src) 98 F (36.7 C) (Oral)  Resp 20  SpO2 97% Physical Exam  Constitutional: He appears well-developed and well-nourished. No distress.  HENT:  Head: Normocephalic and atraumatic.  Eyes: Pupils are equal,  round, and reactive to light.  Neck: Normal range of motion. Neck supple.  Cardiovascular: Normal rate and regular rhythm.   Pulmonary/Chest: Effort normal.  Effort is at baseline, no respiratory distress.  Abdominal: Soft.  Musculoskeletal:       Right hip: Swelling: ecchymosis.       Legs: Pressure shoes on bilateral lower extremities. Symmetrical bilateral lower extremities without swelling. Pedal pulses palpable.  Neurological: He is alert.  Skin: Skin is warm and dry.  Nursing note and vitals reviewed.   ED Course  Procedures (including critical care time) Labs Review Labs Reviewed - No data to display  Imaging Review Dg Chest 2 View  07/10/2014   CLINICAL DATA:  Acute onset of shortness of breath. Subsequent encounter.  EXAM: CHEST  2 VIEW  COMPARISON:  Chest radiograph performed 07/07/2014  FINDINGS: There has been mild interval improvement in pulmonary edema, with persistent small to moderate right and small left pleural effusions. Mild central airspace opacities are seen. No pneumothorax is identified.  The mediastinal silhouette is borderline enlarged. The patient is status post median sternotomy, with evidence of prior CABG. No acute osseous abnormalities are seen.  IMPRESSION: Mild interval improvement in pulmonary edema, with persistent small to moderate right and small left pleural effusions.   Electronically Signed   By: Roanna Raider M.D.   On: 07/10/2014 22:30     EKG Interpretation None      MDM   Final diagnoses:  History of CHF (congestive heart failure)  Hip pain, right    Patient seen by Dr. Patria Mane as well prior to discharge, his pain was easily controlled with 50 mcg fentanyl IV. Social work saw the patient and discussed the Nathan [name] place concerns with patient and family member. Pt at baseline with breathing.   Chest xray shows no acute changes. Will have patient on scheduled tylenol for 1 week and oxycodone  wo tylenol for break through pain  PRN.  74 y.o.Nathan [name] [facility] evaluation in the Emergency Department is complete. It has been determined that no acute conditions requiring  further emergency intervention are present at this time. The patient/guardian have been advised of the diagnosis and plan. We have discussed signs and symptoms that warrant return to the ED, such as changes or worsening in symptoms.  Vital signs are stable at discharge. Filed Vitals:   07/10/14 2038  BP: 126/54  Pulse: 60  Temp: 98 F (36.7 C)  Resp: 20    Patient/guardian has voiced understanding and agreed to follow-up with the PCP or specialist.      Dorthula Matas, PA-C 07/10/14 2308  Lyanne Co, MD 07/10/14 [phone]

## 2014-07-10 NOTE — Progress Notes (Signed)
This RN received a phone call from pt's wife who was upset and crying on the phone stating the patient was now at the SNF facility and in uncontrolled pain and facility could not provide any pain medication to patient as there were none ordered at discharge. This RN questioned whether on call physician at facility had been notified of pain medication needs.  This RN had notified the discharging physician here that patient did not have any pain medications ordered and was receiving IV fentanyl through out the admission, however discharging physician stated he would not order narcotics at discharge.  Facility RN notified wife that patient could be given Vicodin and wife stated that she would try that but was intending to have patient transported back to ED if that did not work.

## 2014-07-10 NOTE — ED Notes (Signed)
[facility] was called for pt's transportation back to [facility]. 

## 2014-07-10 NOTE — Progress Notes (Signed)
Physical Therapy Treatment Patient Details Name: Nathan [name] MRN: [id number] DOB: 02/28/1941 Today's Date: 07/10/2014    History of Present Illness 74 y.o. male with extensive CAD, COPD with ongoing tobacco abuse, chronic CHF, HTN, peripheral vascular disease, GERD, CKD stage III, recently hospitalized from 06/14/2014 to 06/18/2014 for an acute COPD exacerbation and now admitted after fall at home and sustained nondisplaced right-sided pelvic ring disruption now nonweightbearing right lower extremity and touch touchdown weightbearing left lower extremity. VQ scan with intermediate prob PE     PT Comments    Pt assisted to sitting EOB and able to tolerate approx 5 minutes.  Pt looking forward to d/c to SNF soon.  Follow Up Recommendations  SNF;Supervision/Assistance - 24 hour     Equipment Recommendations  None recommended by PT    Recommendations for Other Services       Precautions / Restrictions Precautions Precautions: Fall Restrictions Weight Bearing Restrictions: Yes RLE Weight Bearing: Non weight bearing LLE Weight Bearing: Non weight bearing    Mobility  Bed Mobility Overal bed mobility: Needs Assistance Bed Mobility: Supine to Sit;Sit to Supine     Supine to sit: HOB elevated;Total assist;+2 for physical assistance Sit to supine: Total assist;+2 for physical assistance   General bed mobility comments: verbal cues for technique, pt initiates however requires increased assist to complete mobility  Transfers                    Ambulation/Gait                 Stairs            Wheelchair Mobility    Modified Rankin (Stroke Patients Only)       Balance Overall balance assessment: Needs assistance Sitting-balance support: Bilateral upper extremity supported;Feet supported Sitting balance-Leahy Scale: Fair Sitting balance - Comments: pt sat EOB for approx 5 minutes, min/guard with bil UE support                             Cognition Arousal/Alertness: Awake/alert Behavior During Therapy: WFL for tasks assessed/performed Overall Cognitive Status: Within Functional Limits for tasks assessed                      Exercises General Exercises - Lower Extremity Ankle Circles/Pumps: Both;10 reps;AROM;Seated Short Arc Quad: Both;10 reps;AROM;Seated    General Comments        Pertinent Vitals/Pain Pain Assessment: 0-10 Pain Score: 7  Pain Location: pelvis (with mobility, better at rest) Pain Descriptors / Indicators: Aching;Sore Pain Intervention(s): Limited activity within patient's tolerance;Monitored during session;Repositioned    Home Living                      Prior Function            PT Goals (current goals can now be found in the care plan section) Progress towards PT goals: Progressing toward goals    Frequency  Min 2X/week    PT Plan Current plan remains appropriate    Co-evaluation             End of Session Equipment Utilized During Treatment: Oxygen Activity Tolerance: Patient tolerated treatment well Patient left: in bed;with call bell/phone within reach     Time: 1030-1043 PT Time Calculation (min) (ACUTE ONLY): 13 min  Charges:  $Therapeutic Activity: 8-22 mins  G CodesSarajane Jews 08/05/2014, 10:49 AM Zenovia Jarred, PT, DPT 2014/08/05 Pager: [phone]

## 2014-07-10 NOTE — Care Management Note (Signed)
07/10/14 10:02am Sandford Crazeora Sherrica Niehaus RN,BSN,NCM [phone] IM letter given and explained to pt.

## 2014-07-11 ENCOUNTER — Encounter: Payer: Self-pay | Admitting: Registered Nurse

## 2014-07-11 ENCOUNTER — Non-Acute Institutional Stay (SKILLED_NURSING_FACILITY): Payer: Medicare PPO | Admitting: Registered Nurse

## 2014-07-11 DIAGNOSIS — Z720 Tobacco use: Secondary | ICD-10-CM | POA: Diagnosis not present

## 2014-07-11 DIAGNOSIS — F17200 Nicotine dependence, unspecified, uncomplicated: Secondary | ICD-10-CM

## 2014-07-11 DIAGNOSIS — E43 Unspecified severe protein-calorie malnutrition: Secondary | ICD-10-CM

## 2014-07-11 DIAGNOSIS — Z978 Presence of other specified devices: Secondary | ICD-10-CM

## 2014-07-11 DIAGNOSIS — I2699 Other pulmonary embolism without acute cor pulmonale: Secondary | ICD-10-CM

## 2014-07-11 DIAGNOSIS — I5023 Acute on chronic systolic (congestive) heart failure: Secondary | ICD-10-CM

## 2014-07-11 DIAGNOSIS — Z960 Presence of urogenital implants: Secondary | ICD-10-CM

## 2014-07-11 DIAGNOSIS — S3210XA Unspecified fracture of sacrum, initial encounter for closed fracture: Secondary | ICD-10-CM

## 2014-07-11 DIAGNOSIS — E785 Hyperlipidemia, unspecified: Secondary | ICD-10-CM | POA: Diagnosis not present

## 2014-07-11 DIAGNOSIS — J449 Chronic obstructive pulmonary disease, unspecified: Secondary | ICD-10-CM

## 2014-07-11 DIAGNOSIS — N183 Chronic kidney disease, stage 3 unspecified: Secondary | ICD-10-CM

## 2014-07-11 DIAGNOSIS — Z951 Presence of aortocoronary bypass graft: Secondary | ICD-10-CM | POA: Diagnosis not present

## 2014-07-11 DIAGNOSIS — L89312 Pressure ulcer of right buttock, stage 2: Secondary | ICD-10-CM | POA: Diagnosis not present

## 2014-07-11 DIAGNOSIS — F32A Depression, unspecified: Secondary | ICD-10-CM

## 2014-07-11 DIAGNOSIS — I480 Paroxysmal atrial fibrillation: Secondary | ICD-10-CM | POA: Diagnosis not present

## 2014-07-11 DIAGNOSIS — J9621 Acute and chronic respiratory failure with hypoxia: Secondary | ICD-10-CM

## 2014-07-11 DIAGNOSIS — I10 Essential (primary) hypertension: Secondary | ICD-10-CM | POA: Diagnosis not present

## 2014-07-11 DIAGNOSIS — A4151 Sepsis due to Escherichia coli [E. coli]: Secondary | ICD-10-CM

## 2014-07-11 DIAGNOSIS — G4700 Insomnia, unspecified: Secondary | ICD-10-CM

## 2014-07-11 DIAGNOSIS — F329 Major depressive disorder, single episode, unspecified: Secondary | ICD-10-CM

## 2014-07-11 DIAGNOSIS — Z9889 Other specified postprocedural states: Secondary | ICD-10-CM

## 2014-07-11 DIAGNOSIS — R140 Abdominal distension (gaseous): Secondary | ICD-10-CM

## 2014-07-11 LAB — POCT INR: INR: 2.6 — AB (ref 0.9–1.1)

## 2014-07-11 NOTE — Progress Notes (Signed)
Patient ID: Nathan [name], male   DOB: 1941-02-19, 74 y.o.   MRN: [id number]   Place of Service: [facility]  No Known Allergies  Code Status: Full Code  Goals of Care: Longevity/STR  Chief Complaint  Patient presents with  . Hospitalization Follow-up    HPI 74 y.o. male with multiple complex PMH including CHF, CAD s/p CABG, HTN, HLD, COPD with ongoing tobacco abuse, depression, severe protein-calorie malnutrition among others is being seen for a follow-up visit post hospital admission from 06/23/14 to 07/10/14 for nondisplaced RIGHT obturator ring fractures and nondisplaced RIGHT sacral ala fracture after a fall, intermediate probable PE per VQ scan on 06/26/14, severe sepsis secondary to E. Coli UTI, acute on chronic CHF, colonic ileus, and acute on chronic respiratory failure. He is here for short term rehab and his goal is to return home. Seen in room today. Reported having constant, localized lower back ache 7/10. Has not has any pain med since arrival to facility. Denies any other concerns. Spouse, Cathy bedside-stating that patient is not sleeping well at night.   Review of Systems Constitutional: Negative for fever and chills HENT: Negative for ear pain and sore throat.  Eyes: Negative for eye pain, eye discharge, and visual disturbance  Cardiovascular: Negative for chest pain and palpitations. Positive for leg swelling  Respiratory: Negative for shortness of breath. Positive for cough, congestion, and wheezing  Gastrointestinal: Negative for nausea and vomiting. Negative for abdominal pain, diarrhea and constipation.  Genitourinary: Negative for  dysuria, frequency, urgency, and hematuria Musculoskeletal: See HPI Neurological: Negative for dizziness and headache Skin: Negative for rash. Positive for sore "on bottom"  Psychiatric: Negative for depression.   Past Medical History  Diagnosis Date  . Aorto-iliac disease     a. s/p bilat with stent 2007 - followed by  [facility].  . CAD (coronary artery disease)     a. 10/2011 s/p CABG x4 (LIMA-LAD, SVG-Diag, SVG-OM1 and SVG-PDA)  b. 03/2012 NSTEMI s/p LHC SVG-RCA and LIMA-LAD but total occlusion of SVG-OM as well as SVG-diag. RX therapy recommended   . Hypertension   . ED (erectile dysfunction)   . COPD, severe   . Hyperlipidemia   . Stroke   . GERD (gastroesophageal reflux disease)   . Gout   . Paroxysmal atrial flutter     ablation  . Chronic combined systolic and diastolic CHF (congestive heart failure)     a. Last echo 04/2014: EF 40-45%.  Marland Kitchen PAF (paroxysmal atrial fibrillation)   . CKD (chronic kidney disease), stage III   . Tobacco abuse   . History of noncompliance with medical treatment   . Carotid artery disease     a. RCEA 2013 - followed by [facility].  . Anemia   . Lower GI bleed     a. LGIB 10/2012: Colonoscopy performed June 21 showed blood in the colon but no definite source identified, diverticular versus AVM suspected. Aspirin stopped.  . Mitral regurgitation   . Hypertensive heart disease     Past Surgical History  Procedure Laterality Date  . Spinal fusion    . Hand surgery    . Neck fusion  1975  . Eye surgery    . Coronary artery bypass graft  10/31/2011    Procedure: CORONARY ARTERY BYPASS GRAFTING (CABG);  Surgeon: Delight Ovens, MD;  Location: [facility] OR;  Service: Open Heart Surgery;  Laterality: N/A;  times three using  Left Internal Mammary Artery and Right Greater Saphenous Vein Graft harvested  endoscopically; TEE  . Endarterectomy  10/31/2011    Procedure: ENDARTERECTOMY CAROTID;  Surgeon: Nada LibmanVance W Brabham, MD;  [facility];  Service: Vascular;  Laterality: Right;  with patch angioplasty   . Carotid endarterectomy  10-31-2011    right  . Colonoscopy N/A 10/30/2012    Procedure: COLONOSCOPY;  Surgeon: Shirley FriarVincent C. Schooler, MD;  [facility];  Service: Endoscopy;  Laterality: N/A;  . Left heart catheterization with coronary angiogram N/A 09/04/2011    Procedure: LEFT  HEART CATHETERIZATION WITH CORONARY ANGIOGRAM;  Surgeon: Lesleigh NoeHenry W Smith III, MD;  [facility];  Service: Cardiovascular;  Laterality: N/A;  . Left heart cath N/A 04/03/2012    Procedure: LEFT HEART CATH;  Surgeon: Lennette Biharihomas A Kelly, MD;  [facility];  Service: Cardiovascular;  Laterality: N/A;    History  Substance Use Topics  . Smoking status: Current Some Day Smoker -- 1.00 packs/day for 64 years    Types: Cigarettes  . Smokeless tobacco: Never Used  . 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 Cancer Mother   . Tuberculosis Mother   . Heart disease Father     He did NOT have it before age 74      Medication List       This list is accurate as of: 07/11/14  4:07 PM.  Always use your most recent med list.               acetaminophen 325 MG tablet  Commonly known as:  TYLENOL  Take 1 tablet (325 mg total) by mouth every 6 (six) hours.     albuterol (2.5 MG/3ML) 0.083% nebulizer solution  Commonly known as:  PROVENTIL  Take 2.5 mg by nebulization 3 (three) times daily. And Q6H PRN     atorvastatin 10 MG tablet  Commonly known as:  LIPITOR  Take 1 tablet (10 mg total) by mouth daily at 6 PM.     buPROPion 300 MG 24 hr tablet  Commonly known as:  WELLBUTRIN XL  Take 300 mg by mouth daily.     carvedilol 3.125 MG tablet  Commonly known as:  COREG  Take 1 tablet (3.125 mg total) by mouth 2 (two) times daily with a meal.     cephALEXin 250 MG capsule  Commonly known as:  KEFLEX  Take 1 capsule (250 mg total) by mouth every 8 (eight) hours. For 4 more days.     colchicine 0.6 MG tablet  Take 0.6 mg by mouth daily.     docusate sodium 100 MG capsule  Commonly known as:  COLACE  Take 1 capsule (100 mg total) by mouth 2 (two) times daily.     dronabinol 5 MG capsule  Commonly known as:   MARINOL  Take 1 capsule (5 mg total) by mouth daily before lunch.     feeding supplement (ENSURE COMPLETE) Liqd  Take 237 mLs by mouth 2 (two) times daily between meals.     furosemide 40 MG tablet  Commonly known as:  LASIX  Take 1 tablet (40 mg total) by mouth daily.     guaiFENesin 600 MG 12 hr tablet  Commonly known as:  MUCINEX  Take 600 mg by mouth 2 (two) times daily as needed.  mirtazapine 7.5 MG tablet  Commonly known as:  REMERON  Take 7.5 mg by mouth at bedtime.     mometasone-formoterol 100-5 MCG/ACT Aero  Commonly known as:  DULERA  Inhale 2 puffs into the lungs 2 (two) times daily.     nicotine 21 mg/24hr patch  Commonly known as:  NICODERM CQ - dosed in mg/24 hours  Place 1 patch (21 mg total) onto the skin daily.     nitroGLYCERIN 0.4 MG SL tablet  Commonly known as:  NITROSTAT  Place 0.4 mg under the tongue every 5 (five) minutes as needed for chest pain.     oxyCODONE 5 MG immediate release tablet  Commonly known as:  Oxy IR/ROXICODONE  Take 1 tablet (5 mg total) by mouth every 6 (six) hours as needed for severe pain.     pantoprazole 40 MG tablet  Commonly known as:  PROTONIX  Take 1 tablet (40 mg total) by mouth daily.     polyethylene glycol packet  Commonly known as:  MIRALAX / GLYCOLAX  Take 17 g by mouth daily.     Potassium Chloride ER 20 MEQ Tbcr  Take 20 mEq by mouth 2 (two) times daily.     saccharomyces boulardii 250 MG capsule  Commonly known as:  FLORASTOR  Take 1 capsule (250 mg total) by mouth 2 (two) times daily.     sennosides 8.8 MG/5ML syrup  Commonly known as:  SENOKOT  Take 5 mLs by mouth 2 (two) times daily.     tiotropium 18 MCG inhalation capsule  Commonly known as:  SPIRIVA  Place 1 capsule (18 mcg total) into inhaler and inhale daily.     warfarin 1 MG tablet  Commonly known as:  COUMADIN  Take 1mg  on 07/10/14. Further doses to be determined by INR on 07/11/14.        Physical Exam  BP 109/72 mmHg  Pulse 56   Temp(Src) 97.2 F (36.2 C)  Resp 20  Ht 6\' 1"  (1.854 m)  Wt 165 lb (74.844 kg)  BMI 21.77 kg/m2  SpO2 92%  Constitutional: Very frail elderly male in no acute distress. Ill-appearing-coughing periodically throughout visit. Conversant. HEENT: Normocephalic and atraumatic. PERRL. EOM intact. No scleral icterus. No nasal discharge or sinus tenderness. Oral mucosa moist. Posterior pharynx clear of any exudate or lesions.  Neck: Supple and nontender. No lymphadenopathy, masses, or thyromegaly. No JVD or carotid bruits. Cardiac: Normal S1, S2. RRR without appreciable murmurs, rubs, or gallops. Distal pulses intact. Trace pitting edema Lungs: No respiratory distress. Breath sounds diminished bilaterally without rales, rhonchi, or wheezes. Abdomen: Audible bowel sounds in all quadrants. Slightly distended and tender to palpation. No rebound tenderness.  Musculoskeletal: limited ROM throughout with generalized weakness.  GU: Foley cath in place Skin: Warm and flaky. No rash. 5x7x0.2cm stage 2 pressure ulcer noted on Right buttock-irregular shape with raw pink moist wound bed. Bony prominences erythematous, but blanchable.  Neurological: Arousable. Oriented to self Psychiatric: flat affect and irritable.   Labs Reviewed  CBC Latest Ref Rng 07/10/2014 07/09/2014 07/08/2014  WBC 4.0 - 10.5 K/uL 10.7(H) 10.1 7.4  Hemoglobin 13.0 - 17.0 g/dL 10.0(L) 10.3(L) 10.0(L)  Hematocrit 39.0 - 52.0 % 33.7(L) 33.3(L) 33.3(L)  Platelets 150 - 400 K/uL 307 268 220    CMP Latest Ref Rng 07/09/2014 07/08/2014 07/06/2014  Glucose 70 - 99 mg/dL 94 88 161(W)  BUN 6 - 23 mg/dL 96(E) 45(W) 09(W)  Creatinine 0.50 - 1.35 mg/dL 1.19 1.47 8.29(F)  Sodium  135 - 145 mmol/L 134(L) 139 141  Potassium 3.5 - 5.1 mmol/L 4.2 3.9 3.0(L)  Chloride 96 - 112 mmol/L 100 103 107  CO2 19 - 32 mmol/L 27 28 28   Calcium 8.4 - 10.5 mg/dL 7.7(L) 7.8(L) 7.6(L)  Total Protein 6.0 - 8.3 g/dL - - -  Total Bilirubin 0.3 - 1.2 mg/dL - - -    Alkaline Phos 39 - 117 U/L - - -  AST 0 - 37 U/L - - -  ALT 0 - 53 U/L - - -    Lab Results  Component Value Date   HGBA1C 5.4 04/03/2012    Lab Results  Component Value Date   TSH 0.478 04/21/2014    Lipid Panel     Component Value Date/Time   CHOL 140 04/04/2012 0555   TRIG 183* 04/04/2012 0555   HDL 25* 04/04/2012 0555   CHOLHDL 5.6 04/04/2012 0555   VLDL 37 04/04/2012 0555   LDLCALC 78 04/04/2012 0555   Lab Results  Component Value Date   INR 2.6* 07/11/2014   INR 2.14* 07/10/2014   INR 1.72* 07/09/2014   Diagnostic Studies Reviewed 07/10/14: CXR: Mild interval improvement in pulmonary edema, with persistent small to moderate right and small left pleural effusions.  07/04/14: KUB: similar diffuse gaseous distention of the colon compatible with an ileus. No interval change in the degree of distention. No free air evident on the decubitus view. Vascular stents noted in iliac vessels. No acute osseous finding or abnormal calcification  06/26/14: VQ Scan: Intermediate probability for pulmonary embolism by PIOPED II. Bibasilar airspace disease and pleural effusions significantly limits the utility of V/Q scan.  06/26/13: 2D-Echo -Left ventricle: LVEF is approximately 40% with diffuse hypokinesis, worse in the inferior and posterior   wall. Wall thickness was increased in a pattern of mild LVH. - Mitral valve: There was moderate regurgitation. - Left atrium: The atrium was moderately dilated. - Right ventricle: Systolic function was mildly reduced. - Tricuspid valve: There was moderate regurgitation. - Pulmonary arteries: PA peak pressure: 34 mm Hg (S).  06/23/14: MRI Right hip:  1. Nondisplaced RIGHT obturator ring fractures. Complementary nondisplaced RIGHT sacral ala fracture. 2. Bilateral hip girdle muscular strain. 3. Anasarca up. 4. LEFT inguinal hernia containing a loop of sigmoid colon.  06/23/14: Head CT: Atrophy and microvascular ischemic disease without  acute intracranial process.  Assessment & Plan 1. Essential hypertension Continue coreg 3.125mg  twice daily, lasix 40mg  daily. Monitor BP and his status  2. Acute on chronic systolic CHF (congestive heart failure) Clinically compensated. Continue coreg 3.125mg  twice daily and lasix 40mg  daily with potassium twice daily. Continue daily weight and continue to monitor his status.   3. Pulmonary embolus: Probable INR 2.6 today. Discontinue lovenox. Continue coumadin at 1mg  daily. Recheck INR 07/17/14 and continue to monitor his status  4. Chronic obstructive pulmonary disease, unspecified COPD, unspecified chronic bronchitis type Denies any respiratory distress. Continue oxygen therapy. Continue abuterol via neb three times daily and every six hours as needed for shob/wheezing. Continue dulera 100/15mcg 2 puffs twice daily and spiriva daily. Start mucinex 600mg  twice daily x 5 days then twice daily as needed for cough and congestion. Continue to monitor his status.   5. Sacral fracture, closed, initial encounter With nondisplaced right obturator ring fractures. Conservative management. Continue tylenol 325mg  every six hours as needed with oxycodone 5mg  IR every six hours as needed for pain. Continue to work with PT/OT for gait/strength/balance training to restore/maximize function. Fall  risk precautions  6. Chronic kidney disease (CKD), stage III (moderate) Avoid nephrotoxic agents and monitor renal functions  7. Hyperlipidemia Continue lipitor  daily   8. S/P CABG (coronary artery bypass graft) Denies any chest pain. Continue coreg 3.125mg  twice daily, statin, and SL nitro as needed for chest pain. Continue to monitor his status  9. Tobacco use disorder Continue nicoderm  patch daily. Encourage smoking cessation   10. Protein-calorie malnutrition, severe Continue marinol  daily to stimulate appetite. Continue current diet and dietary supplement. Encourage PO intake.     11. Paroxysmal a-fib Stable. RRR on exam. Continue beta blocker. INR therapeutic today.   12. Decubitus ulcer of buttock, stage 2, right Initiate wound care and pressure ulcer precautions.   13. Acute on chronic respiratory failure with hypoxia Multifactorial and secondary to acute on chronic systolic CHF, PE, and COPD. Denies any respiratory distress at this time. See #2, #3, and #4.  14. Insomnia Start remeron 7.5mg  daily at bedtime x 30 days to help with sleep as well as depression and appetite. Reassess and continue to monitor  15. Foley catheter in place on admission D/c Foley as there was no clear indication for it. Wife reported patient does not have any issues using the urinal   16. Abdominal distension Ileus per recent KUB. Enourage PO intake. Continue bowel regimen with colace  twice daily, senna 8.8mg  twice daily, and miralax 17g daily. Continue to monitor.   17. Sepsis due to E. Coli UTI Continue and complete keflex  every 8 hours for 4 more days. Continue to monitor his status  18. Depression Continue wellbutrin  daily and monitor for change in mood.   Diagnostic Studies/Labs Ordered: cbc w/ diff, cmp   Time spent: <60 minutes on care coordination and counseling   Family/Staff Communication Plan of care discussed with patient, wife, Lynden Ang,  and nursing staff. Patient, Lynden Ang, and nursing staff verbalized understanding and agree with plan of care. No additional questions or concerns reported.    Loura Back, MSN, AGNP-C [facility] [address] [place], [place] [address] [phone] [8am-5pm] After hours: [phone]

## 2014-07-13 ENCOUNTER — Non-Acute Institutional Stay (SKILLED_NURSING_FACILITY): Payer: Medicare PPO | Admitting: Internal Medicine

## 2014-07-13 DIAGNOSIS — Z720 Tobacco use: Secondary | ICD-10-CM

## 2014-07-13 DIAGNOSIS — L89152 Pressure ulcer of sacral region, stage 2: Secondary | ICD-10-CM | POA: Diagnosis not present

## 2014-07-13 DIAGNOSIS — J449 Chronic obstructive pulmonary disease, unspecified: Secondary | ICD-10-CM | POA: Diagnosis not present

## 2014-07-13 DIAGNOSIS — S73024S Obturator dislocation of right hip, sequela: Secondary | ICD-10-CM

## 2014-07-13 DIAGNOSIS — I10 Essential (primary) hypertension: Secondary | ICD-10-CM | POA: Diagnosis not present

## 2014-07-13 DIAGNOSIS — I2699 Other pulmonary embolism without acute cor pulmonale: Secondary | ICD-10-CM

## 2014-07-13 DIAGNOSIS — F329 Major depressive disorder, single episode, unspecified: Secondary | ICD-10-CM

## 2014-07-13 DIAGNOSIS — R339 Retention of urine, unspecified: Secondary | ICD-10-CM | POA: Diagnosis not present

## 2014-07-13 DIAGNOSIS — F32A Depression, unspecified: Secondary | ICD-10-CM

## 2014-07-13 DIAGNOSIS — R5381 Other malaise: Secondary | ICD-10-CM | POA: Diagnosis not present

## 2014-07-13 DIAGNOSIS — I5042 Chronic combined systolic (congestive) and diastolic (congestive) heart failure: Secondary | ICD-10-CM | POA: Diagnosis not present

## 2014-07-13 DIAGNOSIS — R627 Adult failure to thrive: Secondary | ICD-10-CM

## 2014-07-13 DIAGNOSIS — I480 Paroxysmal atrial fibrillation: Secondary | ICD-10-CM | POA: Diagnosis not present

## 2014-07-13 DIAGNOSIS — E46 Unspecified protein-calorie malnutrition: Secondary | ICD-10-CM | POA: Diagnosis not present

## 2014-07-13 DIAGNOSIS — L98429 Non-pressure chronic ulcer of back with unspecified severity: Secondary | ICD-10-CM

## 2014-07-13 DIAGNOSIS — F17200 Nicotine dependence, unspecified, uncomplicated: Secondary | ICD-10-CM

## 2014-07-13 DIAGNOSIS — N390 Urinary tract infection, site not specified: Secondary | ICD-10-CM

## 2014-07-13 DIAGNOSIS — B9620 Unspecified Escherichia coli [E. coli] as the cause of diseases classified elsewhere: Secondary | ICD-10-CM

## 2014-07-13 NOTE — Progress Notes (Signed)
Patient ID: Nathan [name], male   DOB: 05/12/1941, 74 y.o.   MRN: [id number]     Facility: [facility]    PCP: Lillia MountainGRIFFIN,JOHN JOSEPH, MD  Code Status: DNR  No Known Allergies  Chief Complaint  Patient presents with  . New Admit To SNF     HPI:  74 year old patient is here for short term rehabilitation post hospital admission from 06/23/14-07/10/14 with non displaced right obturator ring fracture and non displaced right sacral ala fracture post fall. He was seen by orthopedic and conservative management was recommended. He had dyspnea, cxr showed pulmonary edema and he was diuresed. VQ scan showed intermediate probability of pulmonary embolism and he was started on heparin and coumadin. He was treated for e.coli uti. He then had afib with rvr and became hypoxic. He had acute on chronic respiratory failure and was started on antibiotics and palliative care team was consulted. He then had ileus, refused NGT but ileus resolved later.  He was in the hospital from 06/14/14-06/18/14 with copd exacerbation.  He has PMH of CHF, CAD s/p CABG, HTN, HLD, COPD with ongoing tobacco abuse, depression, severe protein-calorie malnutrition. He is seen in his room today with therapy team present. He is alert and oriented but participates minimally in conversation. He complaints of pain in right hip area. He is non weight bearing on right leg but there were concerns from staff about him bearing weight on right side and now he is complaining of pain on right side. As per staff, pt has minimal participation in therapy.   Review of Systems:  Constitutional: positive for fatigue. Negative for fever, chills, diaphoresis.  HENT: Negative for headache, congestion, nasal discharge Eyes: Negative for eye pain, blurred vision, double vision and discharge.  Respiratory: positive for cough. On o2 by nasal canula. Negative for shortness of breath and wheezing.   Cardiovascular: Negative for chest  pain, palpitations, leg swelling.  Gastrointestinal: Negative for heartburn, nausea, vomiting, abdominal pain. Had a bowel movement yesterday Genitourinary: has not been able to urinate since am. Negative for flank pain.  Musculoskeletal: Negative for back pain, falls. Skin: Negative for itching, rash.  Neurological: Negative for dizziness, tingling, focal weakness Psychiatric/Behavioral: Negative for depression    Past Medical History  Diagnosis Date  . Aorto-iliac disease     a. s/p bilat with stent 2007 - followed by [facility].  . CAD (coronary artery disease)     a. 10/2011 s/p CABG x4 (LIMA-LAD, SVG-Diag, SVG-OM1 and SVG-PDA)  b. 03/2012 NSTEMI s/p LHC SVG-RCA and LIMA-LAD but total occlusion of SVG-OM as well as SVG-diag. RX therapy recommended   . Hypertension   . ED (erectile dysfunction)   . COPD, severe   . Hyperlipidemia   . Stroke   . GERD (gastroesophageal reflux disease)   . Gout   . Paroxysmal atrial flutter     ablation  . Chronic combined systolic and diastolic CHF (congestive heart failure)     a. Last echo 04/2014: EF 40-45%.  Marland Kitchen. PAF (paroxysmal atrial fibrillation)   . CKD (chronic kidney disease), stage III   . Tobacco abuse   . History of noncompliance with medical treatment   . Carotid artery disease     a. RCEA 2013 - followed by [facility].  . Anemia   . Lower GI bleed     a. LGIB 10/2012: Colonoscopy performed June 21 showed blood in the colon but no definite source identified, diverticular versus AVM suspected. Aspirin stopped.  .Marland Kitchen  Mitral regurgitation   . Hypertensive heart disease    Past Surgical History  Procedure Laterality Date  . Spinal fusion    . Hand surgery    . Neck fusion  1975  . Eye surgery    . Coronary artery bypass graft  10/31/2011    Procedure: CORONARY ARTERY BYPASS GRAFTING (CABG);  Surgeon: Delight Ovens, MD;  Location: [facility] OR;  Service: Open Heart Surgery;  Laterality: N/A;  times three using  Left Internal Mammary Artery and Right  Greater Saphenous Vein Graft harvested endoscopically; TEE  . Endarterectomy  10/31/2011    Procedure: ENDARTERECTOMY CAROTID;  Surgeon: Nada Libman, MD;  Location: [facility] OR;  Service: Vascular;  Laterality: Right;  with patch angioplasty   . Carotid endarterectomy  10-31-2011    right  . Colonoscopy N/A 10/30/2012    Procedure: COLONOSCOPY;  Surgeon: Shirley Friar, MD;  Location: [facility] ENDOSCOPY;  Service: Endoscopy;  Laterality: N/A;  . Left heart catheterization with coronary angiogram N/A 09/04/2011    Procedure: LEFT HEART CATHETERIZATION WITH CORONARY ANGIOGRAM;  Surgeon: Lesleigh Noe, MD;  Location: [facility] CATH LAB;  Service: Cardiovascular;  Laterality: N/A;  . Left heart cath N/A 04/03/2012    Procedure: LEFT HEART CATH;  Surgeon: Lennette Bihari, MD;  Location: [facility] CATH LAB;  Service: Cardiovascular;  Laterality: N/A;   Social History:   reports that he has been smoking Cigarettes.  He has a 64 pack-year smoking history. He has never used smokeless tobacco. He reports that he drinks about 3.6 oz of alcohol per week. He reports that he does not use illicit drugs.  Family History  Problem Relation Age of Onset  . Cancer Mother   . Tuberculosis Mother   . Heart disease Father     He did NOT have it before age 72    Medications: Patient's Medications  New Prescriptions   No medications on file  Previous Medications   ACETAMINOPHEN (TYLENOL) 325 MG TABLET    Take 1 tablet (325 mg total) by mouth every 6 (six) hours.   ALBUTEROL (PROVENTIL) (2.5 MG/3ML) 0.083% NEBULIZER SOLUTION    Take 2.5 mg by nebulization 3 (three) times daily. And Q6H PRN   ATORVASTATIN (LIPITOR) 10 MG TABLET    Take 1 tablet (10 mg total) by mouth daily at 6 PM.   BUPROPION (WELLBUTRIN XL) 300 MG 24 HR TABLET    Take 300 mg by mouth daily.   CARVEDILOL (COREG) 3.125 MG TABLET    Take 1 tablet (3.125 mg total) by mouth 2 (two) times daily with a meal.   CEPHALEXIN (KEFLEX) 250 MG CAPSULE    Take 1 capsule  (250 mg total) by mouth every 8 (eight) hours. For 4 more days.   COLCHICINE 0.6 MG TABLET    Take 0.6 mg by mouth daily.   DOCUSATE SODIUM (COLACE) 100 MG CAPSULE    Take 1 capsule (100 mg total) by mouth 2 (two) times daily.   DRONABINOL (MARINOL) 5 MG CAPSULE    Take 1 capsule (5 mg total) by mouth daily before lunch.   FEEDING SUPPLEMENT, ENSURE COMPLETE, (ENSURE COMPLETE) LIQD    Take 237 mLs by mouth 2 (two) times daily between meals.   FUROSEMIDE (LASIX) 40 MG TABLET    Take 1 tablet (40 mg total) by mouth daily.   GUAIFENESIN (MUCINEX) 600 MG 12 HR TABLET    Take 600 mg by mouth 2 (two) times daily as needed.  MIRTAZAPINE (REMERON) 7.5 MG TABLET    Take 7.5 mg by mouth at bedtime.   MOMETASONE-FORMOTEROL (DULERA) 100-5 MCG/ACT AERO    Inhale 2 puffs into the lungs 2 (two) times daily.   NICOTINE (NICODERM CQ - DOSED IN MG/24 HOURS) 21 MG/24HR PATCH    Place 1 patch (21 mg total) onto the skin daily.   NITROGLYCERIN (NITROSTAT) 0.4 MG SL TABLET    Place 0.4 mg under the tongue every 5 (five) minutes as needed for chest pain.   OXYCODONE (OXY IR/ROXICODONE) 5 MG IMMEDIATE RELEASE TABLET    Take 1 tablet (5 mg total) by mouth every 6 (six) hours as needed for severe pain.   PANTOPRAZOLE (PROTONIX) 40 MG TABLET    Take 1 tablet (40 mg total) by mouth daily.   POLYETHYLENE GLYCOL (MIRALAX / GLYCOLAX) PACKET    Take 17 g by mouth daily.   POTASSIUM CHLORIDE 20 MEQ TBCR    Take 20 mEq by mouth 2 (two) times daily.   SACCHAROMYCES BOULARDII (FLORASTOR) 250 MG CAPSULE    Take 1 capsule (250 mg total) by mouth 2 (two) times daily.   SENNOSIDES (SENOKOT) 8.8 MG/5ML SYRUP    Take 5 mLs by mouth 2 (two) times daily.   TIOTROPIUM (SPIRIVA) 18 MCG INHALATION CAPSULE    Place 1 capsule (18 mcg total) into inhaler and inhale daily.   WARFARIN (COUMADIN) 1 MG TABLET    Take 1mg  on 07/10/14. Further doses to be determined by INR on 07/11/14.  Modified Medications   No medications on file  Discontinued  Medications   No medications on file     Physical Exam: Filed Vitals:   07/13/14 1447  BP: 126/70  Pulse: 74  Temp: 97.2 F (36.2 C)  Resp: 18  SpO2: 95%    General- elderly male, chronically ill appearing, in no acute distress Head- normocephalic, atraumatic Throat- moist mucus membrane Neck- no cervical lymphadenopathy Cardiovascular- normal s1,s2, no murmurs, palpable dorsalis pedis and radial pulses, trace right leg edema Respiratory- bilateral decreased air entry, rhonchi present, no wheeze or crackles, on o2 , no use of accessory muscles Abdomen- bowel sounds present, soft, suprapubic distension with tenderness, no guarding or rigidity, no CVA tenderness Musculoskeletal- able to move all 4 extremities, generalized weakness, limited movement at right hip  Neurological- no focal deficit Skin- warm and dry, multiple bruises on back and hip area, stage 2 sacral ulcer, right elbow skin tear Psychiatry- alert and oriented, normal mood and affect   Labs reviewed: Basic Metabolic Panel:  Recent Labs  16/10/96 0411 07/04/14 0418 07/05/14 0417 07/06/14 0406 07/08/14 0411 07/09/14 0400  NA 148* 151* 145 141 139 134*  K 4.2 4.1 3.3* 3.0* 3.9 4.2  CL 106 110 105 107 103 100  CO2 32 31 32 28 28 27   GLUCOSE 106* 109* 105* 144* 88 94  BUN 112* 109* 89* 64* 28* 25*  CREATININE 2.43* 2.36* 1.98* 1.69* 1.23 1.19  CALCIUM 8.3* 8.3* 7.8* 7.6* 7.8* 7.7*  MG 3.1* 2.9* 2.7*  --   --   --   PHOS 6.5* 4.5 2.9  --   --   --    Liver Function Tests:  Recent Labs  06/14/14 1758 06/23/14 1413  AST 22 28  ALT 38 76*  ALKPHOS 64 53  BILITOT 0.9 0.7  PROT 6.2 5.5*  ALBUMIN 3.4* 3.0*   No results for input(s): LIPASE, AMYLASE in the last 8760 hours. No results for input(s): AMMONIA in the  last 8760 hours. CBC:  Recent Labs  06/23/14 1413  06/25/14 0201 06/26/14 0357  07/08/14 0411 07/09/14 0400 07/10/14 0447  WBC 11.2*  < > 10.8* 16.0*  < > 7.4 10.1 10.7*  NEUTROABS  9.9*  --  9.2* 14.8*  --   --   --   --   HGB 12.4*  < > 12.5* 12.5*  < > 10.0* 10.3* 10.0*  HCT 41.3  < > 41.0 40.2  < > 33.3* 33.3* 33.7*  MCV 85.3  < > 84.0 82.7  < > 84.1 84.3 85.5  PLT 142*  < > 136* 101*  < > 220 268 307  < > = values in this interval not displayed. Cardiac Enzymes:  Recent Labs  06/24/14 1419 06/24/14 2006 06/25/14 0201  TROPONINI 0.06* 0.06* 0.05*    Assessment/Plan  Physical deconditioning Will have him work with physical therapy and occupational therapy team to help with gait training and muscle strengthening exercises.fall precautions. Skin care. Encourage to be out of bed. Poor overall prognosis  FTT With physical deconditioning, recent fracture, PCM, pressure sore and medical co-morbidities, has overall poor prognosis. Will have him work with physical therapy and occupational therapy team to help with gait training and muscle strengthening exercises.fall precautions. Skin care. Encourage to be out of bed. If no improvement noticed, to consider palliative care consult  Protein calorie malnutrition On medpass supplement bid, on mechanical soft diet, aspiration precautions, monitor weight, decline anticipated. Get dietary consult. On marinol and remeron, monitor  Stage 2 sacral ulcer Continue wound care, pressure ulcer prophylaxis. Add decubvite 1 tab daily to help with wound healing.   Urinary retention He had 1000 cc urine out by in and out cath, will need to insert foley and get urology consult for him  Non displaced sacral fracture Repeat xray right pelvis to r/o displaced fracture with ? Weight bearing. Continue tylenol  every six hours as needed with oxycodone  IR every six hours as needed for pain. Continue to work with PT/OT for gait/strength/balance training to restore/maximize function. Fall risk precautions  CHF Stable, continue coreg 3.125 bid, lasix 40 mg daily and kcl supplement. Monitor bmp  Pulmonary embolism inr 2.6 on  07/11/14. Off lovenox, continue coumadin 1 mg daily and recheck 07/17/14.   E.coli uti Complete course of keflex 250 mg tid, will need foley inserted, hydration encouraged  Essential hypertension Stable bp readings. Continue coreg 3.125mg  twice daily, lasix  daily. Monitor BP.   COPD On o2, continue this with bronchodilator treatment. Continue mucinex, strict aspiration precautions.  afib Rate controlled, continue coreg and coumadin  Tobacco use disorder Continue nicoderm  patch daily.   Ileus  Had bowel movement yesterday. Continue colace  twice daily, senna 8.8mg  twice daily, and miralax 17g daily. Continue to monitor.    Depression Continue wellbutrin  daily and remeron   Goals of care: short term rehabilitation   Labs/tests ordered: cbc, cmp  Family/ staff Communication: reviewed care plan with patient and nursing supervisor    Oneal Grout, MD  [facility] [phone] (Monday-Friday 8 am - 5 pm) [phone] (afterhours)

## 2014-07-17 ENCOUNTER — Non-Acute Institutional Stay (SKILLED_NURSING_FACILITY): Payer: Medicare PPO | Admitting: Registered Nurse

## 2014-07-17 DIAGNOSIS — R791 Abnormal coagulation profile: Secondary | ICD-10-CM | POA: Diagnosis not present

## 2014-07-17 DIAGNOSIS — I480 Paroxysmal atrial fibrillation: Secondary | ICD-10-CM | POA: Diagnosis not present

## 2014-07-17 DIAGNOSIS — I2699 Other pulmonary embolism without acute cor pulmonale: Secondary | ICD-10-CM | POA: Diagnosis not present

## 2014-07-17 LAB — POCT INR: INR: 1.6 — AB (ref 0.9–1.1)

## 2014-07-17 NOTE — Progress Notes (Signed)
Patient ID: Nathan [name], male   DOB: [birth year], 74 y.o.   MRN: [id number]   Place of Service: [facility]  No Known Allergies  Code Status: Full Code  Goals of Care: Longevity/STR  Chief Complaint  Patient presents with  . Acute Visit    coumadin management    HPI  74 y.o. male with  multiple complex PMH including CHF, CAD s/p CABG, HTN, HLD, COPD with ongoing tobacco abuse, depression, severe protein-calorie malnutrition is being seen for management of warfarin therapy. Patient is on long-term warfarin anticoagulation for probable PE and paroxysmal afib. Goal INR is 2 to 3. Today INR is 1.6 and current warfarin dose is  daily. No missed in past week reported. No recent changes in medications or diet reported. No upcoming procedure. Seen in room today. Patient and wife have some concerns about the food and foley cath-would like to talk with dietician and urologist.   Review of Systems Constitutional: Negative for fever and chills. Cardiovascular: Negative for chest pain, palpitations, and leg swelling Respiratory: Negative cough, shortness of breath, and wheezing.  Gastrointestinal: Negative for nausea and vomiting. Negative for abdominal pain. Negative for bloody stool.   Genitourinary: Negative for hematuria Musculoskeletal: Negative for back pain, joint pain, and joint swelling  Neurological: Negative for dizziness and headache Skin: Negative for rash  Psychiatric: Negative for depression  Past Medical History  Diagnosis Date  . Aorto-iliac disease     a. s/p bilat with stent 2007 - followed by [facility].  . CAD (coronary artery disease)     a. 10/2011 s/p CABG x4 (LIMA-LAD, SVG-Diag, SVG-OM1 and SVG-PDA)  b. 03/2012 NSTEMI s/p LHC SVG-RCA and LIMA-LAD but total occlusion of SVG-OM as well as SVG-diag. RX therapy recommended   . Hypertension   . ED (erectile dysfunction)   . COPD, severe   . Hyperlipidemia   . Stroke   . GERD (gastroesophageal reflux disease)    . Gout   . Paroxysmal atrial flutter     ablation  . Chronic combined systolic and diastolic CHF (congestive heart failure)     a. Last echo 04/2014: EF 40-45%.  Marland Kitchen PAF (paroxysmal atrial fibrillation)   . CKD (chronic kidney disease), stage III   . Tobacco abuse   . History of noncompliance with medical treatment   . Carotid artery disease     a. RCEA 2013 - followed by [facility].  . Anemia   . Lower GI bleed     a. LGIB 10/2012: Colonoscopy performed June 21 showed blood in the colon but no definite source identified, diverticular versus AVM suspected. Aspirin stopped.  . Mitral regurgitation   . Hypertensive heart disease     Past Surgical History  Procedure Laterality Date  . Spinal fusion    . Hand surgery    . Neck fusion  1975  . Eye surgery    . Coronary artery bypass graft  10/31/2011    Procedure: CORONARY ARTERY BYPASS GRAFTING (CABG);  Surgeon: Delight Ovens, MD;  Location: [facility] OR;  Service: Open Heart Surgery;  Laterality: N/A;  times three using  Left Internal Mammary Artery and Right Greater Saphenous Vein Graft harvested endoscopically; TEE  . Endarterectomy  10/31/2011    Procedure: ENDARTERECTOMY CAROTID;  Surgeon: Nada Libman, MD;  Location: [facility] OR;  Service: Vascular;  Laterality: Right;  with patch angioplasty   . Carotid endarterectomy  10-31-2011    right  . Colonoscopy N/A 10/30/2012    Procedure:  COLONOSCOPY;  Surgeon: Shirley Friar, MD;  Location: [facility] ENDOSCOPY;  Service: Endoscopy;  Laterality: N/A;  . Left heart catheterization with coronary angiogram N/A 09/04/2011    Procedure: LEFT HEART CATHETERIZATION WITH CORONARY ANGIOGRAM;  Surgeon: Lesleigh Noe, MD;  Location: [facility] CATH LAB;  Service: Cardiovascular;  Laterality: N/A;  . Left heart cath N/A 04/03/2012    Procedure: LEFT HEART CATH;  Surgeon: Lennette Bihari, MD;  Location: [facility] CATH LAB;  Service: Cardiovascular;  Laterality: N/A;    History   Social History  . Marital Status: Married     Spouse Name: N/A  . Number of Children: N/A  . Years of Education: N/A   Occupational History  . retired    Social History Main Topics  . Smoking status: Current Some Day Smoker -- 1.00 packs/day for 64 years    Types: Cigarettes  . Smokeless tobacco: Never Used  . 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 Drug Use: No  . Sexual Activity: Not on file   Other Topics Concern  . Not on file   Social History Narrative    Family History  Problem Relation Age of Onset  . Cancer Mother   . Tuberculosis Mother   . Heart disease Father     He did NOT have it before age 78      Medication List       This list is accurate as of: 07/17/14  2:25 PM.  Always use your most recent med list.               acetaminophen 325 MG tablet  Commonly known as:  TYLENOL  Take 1 tablet (325 mg total) by mouth every 6 (six) hours.     albuterol (2.5 MG/3ML) 0.083% nebulizer solution  Commonly known as:  PROVENTIL  Take 2.5 mg by nebulization 3 (three) times daily. And Q6H PRN     atorvastatin 10 MG tablet  Commonly known as:  LIPITOR  Take 1 tablet (10 mg total) by mouth daily at 6 PM.     buPROPion 300 MG 24 hr tablet  Commonly known as:  WELLBUTRIN XL  Take 300 mg by mouth daily.     carvedilol 3.125 MG tablet  Commonly known as:  COREG  Take 1 tablet (3.125 mg total) by mouth 2 (two) times daily with a meal.     cephALEXin 250 MG capsule  Commonly known as:  KEFLEX  Take 1 capsule (250 mg total) by mouth every 8 (eight) hours. For 4 more days.     colchicine 0.6 MG tablet  Take 0.6 mg by mouth daily.     docusate sodium 100 MG capsule  Commonly known as:  COLACE  Take 1 capsule (100 mg total) by mouth 2 (two) times daily.     dronabinol 5 MG capsule  Commonly known as:  MARINOL  Take 1 capsule (5 mg total) by mouth daily before lunch.     feeding supplement  (ENSURE COMPLETE) Liqd  Take 237 mLs by mouth 2 (two) times daily between meals.     furosemide 40 MG tablet  Commonly known as:  LASIX  Take 1 tablet (40 mg total) by mouth daily.     guaiFENesin 600 MG 12 hr tablet  Commonly known as:  MUCINEX  Take 600 mg by mouth 2 (two) times daily as needed.     mirtazapine 7.5 MG tablet  Commonly known as:  REMERON  Take 7.5 mg by mouth at bedtime.     mometasone-formoterol 100-5 MCG/ACT Aero  Commonly known as:  DULERA  Inhale 2 puffs into the lungs 2 (two) times daily.     nicotine 21 mg/24hr patch  Commonly known as:  NICODERM CQ - dosed in mg/24 hours  Place 1 patch (21 mg total) onto the skin daily.     nitroGLYCERIN 0.4 MG SL tablet  Commonly known as:  NITROSTAT  Place 0.4 mg under the tongue every 5 (five) minutes as needed for chest pain.     oxyCODONE 5 MG immediate release tablet  Commonly known as:  Oxy IR/ROXICODONE  Take 1 tablet (5 mg total) by mouth every 6 (six) hours as needed for severe pain.     pantoprazole 40 MG tablet  Commonly known as:  PROTONIX  Take 1 tablet (40 mg total) by mouth daily.     polyethylene glycol packet  Commonly known as:  MIRALAX / GLYCOLAX  Take 17 g by mouth daily.     Potassium Chloride ER 20 MEQ Tbcr  Take 20 mEq by mouth 2 (two) times daily.     saccharomyces boulardii 250 MG capsule  Commonly known as:  FLORASTOR  Take 1 capsule (250 mg total) by mouth 2 (two) times daily.     sennosides 8.8 MG/5ML syrup  Commonly known as:  SENOKOT  Take 5 mLs by mouth 2 (two) times daily.     tiotropium 18 MCG inhalation capsule  Commonly known as:  SPIRIVA  Place 1 capsule (18 mcg total) into inhaler and inhale daily.     warfarin 1 MG tablet  Commonly known as:  COUMADIN  Take 1mg  on 07/10/14. Further doses to be determined by INR on 07/11/14.        Physical Exam  BP 119/64 mmHg  Pulse 88  Temp(Src) 97.9 F (36.6 C)  Resp 18  SpO2 95%  Constitutional: Very frail elderly  male in no acute distress. Conversant. HEENT: Normocephalic and atraumatic. PERRL. EOM intact. No scleral icterus. No nasal discharge or sinus tenderness. Oral mucosa moist. Posterior pharynx clear of any exudate or lesions.  Neck: Supple and nontender. No lymphadenopathy, masses, or thyromegaly. No JVD or carotid bruits. Cardiac: Normal S1, S2. RRR without appreciable murmurs, rubs, or gallops. Distal pulses intact. Trace pitting edema Lungs: No respiratory distress. Breath sounds diminished bilaterally without rales, rhonchi, or wheezes. Abdomen: Audible bowel sounds in all quadrants. Non-distended and nontender. Musculoskeletal: limited ROM throughout with generalized weakness.  GU: Foley cath in place Skin: Warm, very dry and flaky.  Neurological: Alert and oriented to self Psychiatric: flat affect and irritable.   Labs Reviewed  Lab Results  Component Value Date   INR 1.6* 07/17/2014   INR 2.6* 07/11/2014   INR 2.14* 07/10/2014   Assessment & Plan 1. Pulmonary embolus: Probable 2. Paroxysmal a-fib 3. Subtherapeutic international normalized ratio (INR) INR 1.6 today. Will increase coumadin to 1.5mg  daily and recheck INR on 07/24/14. Continue to monitor his status.   Also informed wife and spouse that dietary consult and urology consult were already in place.   Family/Staff Communication Plan of care discussed with patient, wife, Lynden AngCathy, and nursing supervisior. Patient, wife, and nursing supervisor verbalized understanding and agree with plan of care. No additional questions or concerns reported.    Loura BackKim Leanna Hamid,  MSN, [facility] [address] [place], [place] [address] [phone] [8am-5pm] After hours: [phone]

## 2014-07-18 ENCOUNTER — Ambulatory Visit: Payer: Medicare PPO | Admitting: Interventional Cardiology

## 2014-07-21 LAB — CBC AND DIFFERENTIAL
HCT: 34 % — AB (ref 41–53)
Hemoglobin: 10.7 g/dL — AB (ref 13.5–17.5)
PLATELETS: 193 10*3/uL (ref 150–399)
WBC: 8.8 10^3/mL

## 2014-07-24 ENCOUNTER — Non-Acute Institutional Stay (SKILLED_NURSING_FACILITY): Payer: Medicare PPO | Admitting: Registered Nurse

## 2014-07-24 ENCOUNTER — Encounter: Payer: Self-pay | Admitting: Registered Nurse

## 2014-07-24 DIAGNOSIS — I2699 Other pulmonary embolism without acute cor pulmonale: Secondary | ICD-10-CM

## 2014-07-24 DIAGNOSIS — I480 Paroxysmal atrial fibrillation: Secondary | ICD-10-CM | POA: Diagnosis not present

## 2014-07-24 DIAGNOSIS — R791 Abnormal coagulation profile: Secondary | ICD-10-CM

## 2014-07-24 LAB — POCT INR: INR: 1.4 — AB (ref 0.9–1.1)

## 2014-07-24 NOTE — Progress Notes (Signed)
Patient ID: Nathan [name], male   DOB: 12/23/1940, 74 y.o.   MRN: [id number]   Place of Service: [facility]  No Known Allergies  Code Status: Full Code  Goals of Care: Longevity/STR  Chief Complaint  Patient presents with  . Acute Visit    subtherapeutic INR    HPI  74 y.o. male with  multiple complex PMH including CHF, CAD s/p CABG, HTN, HLD, COPD with ongoing tobacco abuse, depression, severe protein-calorie malnutrition is being seen for management of warfarin therapy. Patient is on long-term warfarin anticoagulation for probable PE and paroxysmal afib. Goal INR is 2 to 3. Today INR is 1.4 and current warfarin dose is 1.5mg  daily. No missed dose in past week reported. No recent changes in medications or diet reported. No upcoming procedure. Seen in room today. Denies any concerns  Review of Systems Constitutional: Negative for fever and chills. Cardiovascular: Negative for chest pain, palpitations, and leg swelling Respiratory: Negative cough, shortness of breath, and wheezing.  Gastrointestinal: Negative for nausea and vomiting. Negative for abdominal pain. Negative for bloody stool.   Genitourinary: Positive for hematuria  Musculoskeletal: Negative for back pain Neurological: Negative for dizziness and headache Skin: Negative for rash  Psychiatric: Negative for depression  Past Medical History  Diagnosis Date  . Aorto-iliac disease     a. s/p bilat with stent 2007 - followed by [facility].  . CAD (coronary artery disease)     a. 10/2011 s/p CABG x4 (LIMA-LAD, SVG-Diag, SVG-OM1 and SVG-PDA)  b. 03/2012 NSTEMI s/p LHC SVG-RCA and LIMA-LAD but total occlusion of SVG-OM as well as SVG-diag. RX therapy recommended   . Hypertension   . ED (erectile dysfunction)   . COPD, severe   . Hyperlipidemia   . Stroke   . GERD (gastroesophageal reflux disease)   . Gout   . Paroxysmal atrial flutter     ablation  . Chronic combined systolic and diastolic CHF (congestive heart  failure)     a. Last echo 04/2014: EF 40-45%.  Marland Kitchen. PAF (paroxysmal atrial fibrillation)   . CKD (chronic kidney disease), stage III   . Tobacco abuse   . History of noncompliance with medical treatment   . Carotid artery disease     a. RCEA 2013 - followed by [facility].  . Anemia   . Lower GI bleed     a. LGIB 10/2012: Colonoscopy performed June 21 showed blood in the colon but no definite source identified, diverticular versus AVM suspected. Aspirin stopped.  . Mitral regurgitation   . Hypertensive heart disease     Past Surgical History  Procedure Laterality Date  . Spinal fusion    . Hand surgery    . Neck fusion  1975  . Eye surgery    . Coronary artery bypass graft  10/31/2011    Procedure: CORONARY ARTERY BYPASS GRAFTING (CABG);  Surgeon: Delight OvensEdward B Gerhardt, MD;  [facility];  Service: Open Heart Surgery;  Laterality: N/A;  times three using  Left Internal Mammary Artery and Right Greater Saphenous Vein Graft harvested endoscopically; TEE  . Endarterectomy  10/31/2011    Procedure: ENDARTERECTOMY CAROTID;  Surgeon: Nada LibmanVance W Brabham, MD;  [facility];  Service: Vascular;  Laterality: Right;  with patch angioplasty   . Carotid endarterectomy  10-31-2011    right  . Colonoscopy N/A 10/30/2012    Procedure: COLONOSCOPY;  Surgeon: Shirley FriarVincent C. Schooler, MD;  [facility];  Service: Endoscopy;  Laterality: N/A;  . Left heart  catheterization with coronary angiogram N/A 09/04/2011    Procedure: LEFT HEART CATHETERIZATION WITH CORONARY ANGIOGRAM;  Surgeon: Lesleigh Noe, MD;  Location: [facility] C[facility] LAB;  Service: Cardiovascular;  Laterality: N/A;  . Left heart cath N/A 04/03/2012    Procedure: LEFT HEART CATH;  Surgeon: Lennette Bihari, MD;  Location: [facility] CATH LAB;  Service: Cardiovascular;  Laterality: N/A;    History   Social History  . Marital Status: Married    Spouse Name: N/A  . Number of Children: N/A  . Years of Education: N/A   Occupational History  . retired     Social History Main Topics  . Smoking status: Current Some Day Smoker -- 1.00 packs/day for 64 years    Types: Cigarettes  . Smokeless tobacco: Never Used  . 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 Drug Use: No  . Sexual Activity: Not on file   Other Topics Concern  . Not on file   Social History Narrative    Family History  Problem Relation Age of Onset  . Cancer Mother   . Tuberculosis Mother   . Heart disease Father     He did NOT have it before age 70      Medication List       This list is accurate as of: 07/24/14  8:49 PM.  Always use your most recent med list.               acetaminophen 325 MG tablet  Commonly known as:  TYLENOL  Take 1 tablet (325 mg total) by mouth every 6 (six) hours.     albuterol (2.5 MG/3ML) 0.083% nebulizer solution  Commonly known as:  PROVENTIL  Take 2.5 mg by nebulization 3 (three) times daily. And Q6H PRN     atorvastatin 10 MG tablet  Commonly known as:  LIPITOR  Take 1 tablet (10 mg total) by mouth daily at 6 PM.     buPROPion 300 MG 24 hr tablet  Commonly known as:  WELLBUTRIN XL  Take 300 mg by mouth daily.     carvedilol 3.125 MG tablet  Commonly known as:  COREG  Take 1 tablet (3.125 mg total) by mouth 2 (two) times daily with a meal.     cephALEXin 250 MG capsule  Commonly known as:  KEFLEX  Take 1 capsule (250 mg total) by mouth every 8 (eight) hours. For 4 more days.     colchicine 0.6 MG tablet  Take 0.6 mg by mouth daily.     docusate sodium 100 MG capsule  Commonly known as:  COLACE  Take 1 capsule (100 mg total) by mouth 2 (two) times daily.     dronabinol 5 MG capsule  Commonly known as:  MARINOL  Take 1 capsule (5 mg total) by mouth daily before lunch.     feeding supplement (ENSURE COMPLETE) Liqd  Take 237 mLs by mouth 2 (two) times daily between meals.     furosemide 40 MG  tablet  Commonly known as:  LASIX  Take 1 tablet (40 mg total) by mouth daily.     guaiFENesin 600 MG 12 hr tablet  Commonly known as:  MUCINEX  Take 600 mg by mouth 2 (two) times daily as needed.     mirtazapine 7.5 MG tablet  Commonly  known as:  REMERON  Take 7.5 mg by mouth at bedtime.     mometasone-formoterol 100-5 MCG/ACT Aero  Commonly known as:  DULERA  Inhale 2 puffs into the lungs 2 (two) times daily.     nicotine 21 mg/24hr patch  Commonly known as:  NICODERM CQ - dosed in mg/24 hours  Place 1 patch (21 mg total) onto the skin daily.     nitroGLYCERIN 0.4 MG SL tablet  Commonly known as:  NITROSTAT  Place 0.4 mg under the tongue every 5 (five) minutes as needed for chest pain.     oxyCODONE 5 MG immediate release tablet  Commonly known as:  Oxy IR/ROXICODONE  Take 1 tablet (5 mg total) by mouth every 6 (six) hours as needed for severe pain.     pantoprazole 40 MG tablet  Commonly known as:  PROTONIX  Take 1 tablet (40 mg total) by mouth daily.     polyethylene glycol packet  Commonly known as:  MIRALAX / GLYCOLAX  Take 17 g by mouth daily.     Potassium Chloride ER 20 MEQ Tbcr  Take 20 mEq by mouth 2 (two) times daily.     saccharomyces boulardii 250 MG capsule  Commonly known as:  FLORASTOR  Take 1 capsule (250 mg total) by mouth 2 (two) times daily.     sennosides 8.8 MG/5ML syrup  Commonly known as:  SENOKOT  Take 5 mLs by mouth 2 (two) times daily.     tiotropium 18 MCG inhalation capsule  Commonly known as:  SPIRIVA  Place 1 capsule (18 mcg total) into inhaler and inhale daily.     warfarin 1 MG tablet  Commonly known as:  COUMADIN  Take 1.5 mg by mouth daily.        Physical Exam  BP 142/65 mmHg  Pulse 91  Temp(Src) 97.6 F (36.4 C)  Resp 19  Constitutional: Very frail elderly male in no acute distress. Conversant and pleasant today HEENT: Normocephalic and atraumatic. PERRL. EOM intact. No scleral icterus. Oral mucosa moist.  Posterior pharynx clear of any exudate or lesions.  Neck: No lymphadenopathy, masses, or thyromegaly. No JVD or carotid bruits. Cardiac: Normal S1, S2. RRR without appreciable murmurs, rubs, or gallops. Distal pulses intact. Trace pitting edema Lungs: No respiratory distress. Breath sounds diminished bilaterally without rales, rhonchi, or wheezes. Abdomen: Audible bowel sounds in all quadrants. Non-distended and nontender. Musculoskeletal: limited ROM throughout with generalized weakness.  GU: Foley cath in place. Tea colored.  Skin: Warm, very dry and flaky.  Neurological: Alert and oriented to self Psychiatric: appropriate mood and affect  Labs Reviewed  Lab Results  Component Value Date   INR 1.4* 07/24/2014   INR 1.6* 07/17/2014   INR 2.6* 07/11/2014   Assessment & Plan 1. Pulmonary embolus: Probable 2. Paroxysmal a-fib 3. Subtherapeutic international normalized ratio (INR) INR 1.4 today. Give Coumadin  x once. Recheck INR on 07/25/14. Continue to monitor his status.   Family/Staff Communication Plan of care discussed with patient and nursing supervisior. Patient and nursing supervisor verbalized understanding and agree with plan of care. No additional questions or concerns reported.    Loura Back, MSN, AGNP-C [facility] [address] [place], [place] [address] [phone] [8am-5pm] After hours: [phone]

## 2014-07-26 ENCOUNTER — Other Ambulatory Visit: Payer: Self-pay

## 2014-07-26 MED ORDER — OXYCODONE HCL 5 MG PO TABS: 5.0000 mg | ORAL_TABLET | Freq: Four times a day (QID) | ORAL | Status: DC | PRN

## 2014-07-26 NOTE — Telephone Encounter (Signed)
Rx faxed to [facility] @ [phone], phone number [phone]  

## 2014-07-31 ENCOUNTER — Non-Acute Institutional Stay (SKILLED_NURSING_FACILITY): Payer: Medicare PPO | Admitting: Registered Nurse

## 2014-07-31 ENCOUNTER — Encounter: Payer: Self-pay | Admitting: Registered Nurse

## 2014-07-31 DIAGNOSIS — W19XXXA Unspecified fall, initial encounter: Secondary | ICD-10-CM | POA: Diagnosis not present

## 2014-07-31 DIAGNOSIS — R791 Abnormal coagulation profile: Secondary | ICD-10-CM | POA: Diagnosis not present

## 2014-07-31 DIAGNOSIS — Y92129 Unspecified place in nursing home as the place of occurrence of the external cause: Secondary | ICD-10-CM

## 2014-07-31 DIAGNOSIS — I2699 Other pulmonary embolism without acute cor pulmonale: Secondary | ICD-10-CM

## 2014-07-31 DIAGNOSIS — I480 Paroxysmal atrial fibrillation: Secondary | ICD-10-CM | POA: Diagnosis not present

## 2014-07-31 LAB — POCT INR: INR: 1.9 — AB (ref 0.9–1.1)

## 2014-07-31 NOTE — Progress Notes (Signed)
Patient ID: Nathan [name], male   DOB: 12/29/1940, 74 y.o.   MRN: [id number]   [facility] of Service: [facility]  No Known Allergies  Code Status: Full Code  Goals of Care: Longevity/STR  Chief Complaint  Patient presents with  . Acute Visit    subtherapeutic INR, fall    HPI  74 y.o. male with  multiple complex PMH including CHF, CAD s/p CABG, HTN, HLD, COPD with ongoing tobacco abuse, depression, severe protein-calorie malnutrition is being seen for management of warfarin therapy. Patient is on long-term warfarin anticoagulation for probable PE and paroxysmal afib. Goal INR is 2 to 3. Today INR is 1.9 and current warfarin dose is 2mg  daily. No missed dose in past week reported. No upcoming procedure. Per nursing, patient has a fall on 3/20 w/o any apparent injuries. He was on bathroom floor around 5:15am. Seen in room today. Reported that he got up unassisted to use the toilet and "lost balance". Denies any injuries or pain associated with the fall. Denies any other concerns.   Review of Systems Constitutional: Negative for fever and chills. Cardiovascular: Negative for chest pain, palpitations, and leg swelling Respiratory: Negative cough, shortness of breath, and wheezing.  Gastrointestinal: Negative for nausea and vomiting. Negative for abdominal pain. Negative for bloody stool.   Musculoskeletal: Negative for back pain. Positive for generalized weakness GU: Negative for hematuria  Neurological: Negative for dizziness and headache Skin: Negative for rash  Psychiatric: Negative for depression  Past Medical History  Diagnosis Date  . Aorto-iliac disease     a. s/p bilat with stent 2007 - followed by [facility].  . CAD (coronary artery disease)     a. 10/2011 s/p CABG x4 (LIMA-LAD, SVG-Diag, SVG-OM1 and SVG-PDA)  b. 03/2012 NSTEMI s/p LHC SVG-RCA and LIMA-LAD but total occlusion of SVG-OM as well as SVG-diag. RX therapy recommended   . Hypertension   . ED (erectile  dysfunction)   . COPD, severe   . Hyperlipidemia   . Stroke   . GERD (gastroesophageal reflux disease)   . Gout   . Paroxysmal atrial flutter     ablation  . Chronic combined systolic and diastolic CHF (congestive heart failure)     a. Last echo 04/2014: EF 40-45%.  Marland Kitchen. PAF (paroxysmal atrial fibrillation)   . CKD (chronic kidney disease), stage III   . Tobacco abuse   . History of noncompliance with medical treatment   . Carotid artery disease     a. RCEA 2013 - followed by [facility].  . Anemia   . Lower GI bleed     a. LGIB 10/2012: Colonoscopy performed June 21 showed blood in the colon but no definite source identified, diverticular versus AVM suspected. Aspirin stopped.  . Mitral regurgitation   . Hypertensive heart disease     Past Surgical History  Procedure Laterality Date  . Spinal fusion    . Hand surgery    . Neck fusion  1975  . Eye surgery    . Coronary artery bypass graft  10/31/2011    Procedure: CORONARY ARTERY BYPASS GRAFTING (CABG);  Surgeon: Delight OvensEdward B Gerhardt, MD;  [facility];  Service: Open Heart Surgery;  Laterality: N/A;  times three using  Left Internal Mammary Artery and Right Greater Saphenous Vein Graft harvested endoscopically; TEE  . Endarterectomy  10/31/2011    Procedure: ENDARTERECTOMY CAROTID;  Surgeon: Nada LibmanVance W Brabham, MD;  [facility];  Service: Vascular;  Laterality: Right;  with patch angioplasty   .  Carotid endarterectomy  10-31-2011    right  . Colonoscopy N/A 10/30/2012    Procedure: COLONOSCOPY;  Surgeon: Shirley Friar, MD;  Location: [facility] ENDOSCOPY;  Service: Endoscopy;  Laterality: N/A;  . Left heart catheterization with coronary angiogram N/A 09/04/2011    Procedure: LEFT HEART CATHETERIZATION WITH CORONARY ANGIOGRAM;  Surgeon: Lesleigh Noe, MD;  Location: [facility] CATH LAB;  Service: Cardiovascular;  Laterality: N/A;  . Left heart cath N/A 04/03/2012    Procedure: LEFT HEART CATH;  Surgeon: Lennette Bihari, MD;  Location: [facility] CATH LAB;   Service: Cardiovascular;  Laterality: N/A;    History   Social History  . Marital Status: Married    Spouse Name: N/A  . Number of Children: N/A  . Years of Education: N/A   Occupational History  . retired    Social History Main Topics  . Smoking status: Current Some Day Smoker -- 1.00 packs/day for 64 years    Types: Cigarettes  . Smokeless tobacco: Never Used  . 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 Drug Use: No  . Sexual Activity: Not on file   Other Topics Concern  . Not on file   Social History Narrative    Family History  Problem Relation Age of Onset  . Cancer Mother   . Tuberculosis Mother   . Heart disease Father     He did NOT have it before age 71      Medication List       This list is accurate as of: 07/31/14  1:47 PM.  Always use your most recent med list.               acetaminophen 325 MG tablet  Commonly known as:  TYLENOL  Take 1 tablet (325 mg total) by mouth every 6 (six) hours.     albuterol (2.5 MG/3ML) 0.083% nebulizer solution  Commonly known as:  PROVENTIL  Take 2.5 mg by nebulization 3 (three) times daily. And Q6H PRN     atorvastatin 10 MG tablet  Commonly known as:  LIPITOR  Take 1 tablet (10 mg total) by mouth daily at 6 PM.     buPROPion 300 MG 24 hr tablet  Commonly known as:  WELLBUTRIN XL  Take 300 mg by mouth daily.     carvedilol 3.125 MG tablet  Commonly known as:  COREG  Take 1 tablet (3.125 mg total) by mouth 2 (two) times daily with a meal.     cephALEXin 250 MG capsule  Commonly known as:  KEFLEX  Take 1 capsule (250 mg total) by mouth every 8 (eight) hours. For 4 more days.     colchicine 0.6 MG tablet  Take 0.6 mg by mouth daily.     docusate sodium 100 MG capsule  Commonly known as:  COLACE  Take 1 capsule (100 mg total) by mouth 2 (two) times daily.     dronabinol 5 MG capsule    Commonly known as:  MARINOL  Take 1 capsule (5 mg total) by mouth daily before lunch.     feeding supplement (ENSURE COMPLETE) Liqd  Take 237 mLs by mouth 2 (two) times daily between meals.     furosemide 40 MG tablet  Commonly known as:  LASIX  Take 1 tablet (40 mg total) by mouth  daily.     guaiFENesin 600 MG 12 hr tablet  Commonly known as:  MUCINEX  Take 600 mg by mouth 2 (two) times daily as needed.     mirtazapine 7.5 MG tablet  Commonly known as:  REMERON  Take 7.5 mg by mouth at bedtime.     mometasone-formoterol 100-5 MCG/ACT Aero  Commonly known as:  DULERA  Inhale 2 puffs into the lungs 2 (two) times daily.     nicotine 21 mg/24hr patch  Commonly known as:  NICODERM CQ - dosed in mg/24 hours  Place 1 patch (21 mg total) onto the skin daily.     nitroGLYCERIN 0.4 MG SL tablet  Commonly known as:  NITROSTAT  Place 0.4 mg under the tongue every 5 (five) minutes as needed for chest pain.     oxyCODONE 5 MG immediate release tablet  Commonly known as:  Oxy IR/ROXICODONE  Take 1 tablet (5 mg total) by mouth every 6 (six) hours as needed for severe pain.     pantoprazole 40 MG tablet  Commonly known as:  PROTONIX  Take 1 tablet (40 mg total) by mouth daily.     polyethylene glycol packet  Commonly known as:  MIRALAX / GLYCOLAX  Take 17 g by mouth daily.     Potassium Chloride ER 20 MEQ Tbcr  Take 20 mEq by mouth 2 (two) times daily.     saccharomyces boulardii 250 MG capsule  Commonly known as:  FLORASTOR  Take 1 capsule (250 mg total) by mouth 2 (two) times daily.     sennosides 8.8 MG/5ML syrup  Commonly known as:  SENOKOT  Take 5 mLs by mouth 2 (two) times daily.     tiotropium 18 MCG inhalation capsule  Commonly known as:  SPIRIVA  Place 1 capsule (18 mcg total) into inhaler and inhale daily.     warfarin 1 MG tablet  Commonly known as:  COUMADIN  Take 2 mg by mouth daily.        Physical Exam  BP 132/90 mmHg  Pulse 94  Temp(Src) 98 F  (36.7 C)  Resp 20  Ht  (1.854 m)  Wt 167 lb 1.6 oz (75.796 kg)  BMI 22.05 kg/m2  SpO2 98%  Constitutional: Very frail elderly male in no acute distress. Conversant and pleasant  HEENT: Normocephalic and atraumatic. PERRL. EOM intact. No scleral icterus. Oral mucosa moist. Posterior pharynx clear of any exudate or lesions.  Neck: No lymphadenopathy, masses, or thyromegaly. No JVD or carotid bruits. Cardiac: Normal S1, S2. RRR without appreciable murmurs, rubs, or gallops. Distal pulses intact. Trace pitting edema Lungs: No respiratory distress. Breath sounds diminished bilaterally with wheezing LLL. Abdomen: Audible bowel sounds in all quadrants. Non-distended and nontender. Musculoskeletal: limited ROM throughout with generalized weakness.  Skin: Warm, very dry and flaky.  Neurological: Alert and oriented to self Psychiatric: appropriate mood and affect  Labs Reviewed  Lab Results  Component Value Date   INR 1.9* 07/31/2014   INR 1.4* 07/24/2014   INR 1.6* 07/17/2014   Assessment & Plan 1. Pulmonary embolus: Probable 2. Paroxysmal a-fib 3. Subtherapeutic international normalized ratio (INR) INR 1.9 today. Continue coumadin at  daily. Recheck INR on 08/07/14. Continue to monitor his status.   4. Fall at nursing home, initial encounter No apparent injuries. Encourage patient to ask for assistance with transfer. Continue to work with PT/OT for gait/strength/blance training to restore/maximize function. Continue fall risk precautions.   Family/Staff Communication Plan of care discussed  with patient and nursing staff. Patient and nursing staff verbalized understanding and agree with plan of care. No additional questions or concerns reported.    Arthur Holms, MSN, AGNP-C [facility] [address] [place], [place] [address] [phone] [8am-5pm] After hours: [phone]

## 2014-08-01 ENCOUNTER — Ambulatory Visit: Payer: Medicare PPO | Admitting: Interventional Cardiology

## 2014-08-03 ENCOUNTER — Other Ambulatory Visit: Payer: Self-pay | Admitting: *Deleted

## 2014-08-03 MED ORDER — OXYCODONE HCL 5 MG PO TABS: 5.0000 mg | ORAL_TABLET | Freq: Four times a day (QID) | ORAL | Status: AC | PRN

## 2014-08-03 NOTE — Telephone Encounter (Signed)
[facility] 

## 2014-08-07 ENCOUNTER — Non-Acute Institutional Stay (SKILLED_NURSING_FACILITY): Payer: Medicare PPO | Admitting: Registered Nurse

## 2014-08-07 DIAGNOSIS — I2699 Other pulmonary embolism without acute cor pulmonale: Secondary | ICD-10-CM

## 2014-08-07 DIAGNOSIS — I480 Paroxysmal atrial fibrillation: Secondary | ICD-10-CM

## 2014-08-07 DIAGNOSIS — R791 Abnormal coagulation profile: Secondary | ICD-10-CM

## 2014-08-07 LAB — POCT INR: INR: 1.5 — AB (ref 0.9–1.1)

## 2014-08-08 ENCOUNTER — Encounter: Payer: Self-pay | Admitting: Registered Nurse

## 2014-08-08 NOTE — Progress Notes (Signed)
Patient ID: Nathan [name], male   DOB: [birth year], 74 y.o.   MRN: [id number]   Place of Service: [facility]  No Known Allergies  Code Status: Full Code  Goals of Care: Longevity/STR  Chief Complaint  Patient presents with  . Acute Visit    subtherapeutic INR    HPI  74 y.o. male with  multiple complex PMH including CHF, CAD s/p CABG, HTN, HLD, COPD with ongoing tobacco abuse, depression, severe protein-calorie malnutrition is being seen for management of warfarin therapy. Patient is on long-term warfarin anticoagulation for probable PE and paroxysmal afib. Goal INR is 2 to 3. Today INR is 1.5 and current warfarin dose is  daily. No missed dose in past week reported. No upcoming procedure. No change in medication or diet reported. Seen in room today. Denies any concerns.   Review of Systems Constitutional: Negative for fever and chills. Cardiovascular: Negative for chest pain, palpitations, and leg swelling Respiratory: Negative cough, shortness of breath, and wheezing.  Gastrointestinal: Negative for nausea and vomiting. Negative for abdominal pain. Negative for bloody stool.   Musculoskeletal: Negative for back pain. Positive for generalized weakness GU: Negative for hematuria  Neurological: Negative for dizziness and headache Skin: Negative for rash  Psychiatric: Negative for depression  Past Medical History  Diagnosis Date  . Aorto-iliac disease     a. s/p bilat with stent 2007 - followed by [facility].  . CAD (coronary artery disease)     a. 10/2011 s/p CABG x4 (LIMA-LAD, SVG-Diag, SVG-OM1 and SVG-PDA)  b. 03/2012 NSTEMI s/p LHC SVG-RCA and LIMA-LAD but total occlusion of SVG-OM as well as SVG-diag. RX therapy recommended   . Hypertension   . ED (erectile dysfunction)   . COPD, severe   . Hyperlipidemia   . Stroke   . GERD (gastroesophageal reflux disease)   . Gout   . Paroxysmal atrial flutter     ablation  . Chronic combined systolic and diastolic CHF  (congestive heart failure)     a. Last echo 04/2014: EF 40-45%.  Marland Kitchen PAF (paroxysmal atrial fibrillation)   . CKD (chronic kidney disease), stage III   . Tobacco abuse   . History of noncompliance with medical treatment   . Carotid artery disease     a. RCEA 2013 - followed by [facility].  . Anemia   . Lower GI bleed     a. LGIB 10/2012: Colonoscopy performed June 21 showed blood in the colon but no definite source identified, diverticular versus AVM suspected. Aspirin stopped.  . Mitral regurgitation   . Hypertensive heart disease     Past Surgical History  Procedure Laterality Date  . Spinal fusion    . Hand surgery    . Neck fusion  1975  . Eye surgery    . Coronary artery bypass graft  10/31/2011    Procedure: CORONARY ARTERY BYPASS GRAFTING (CABG);  Surgeon: Delight Ovens, MD;  Location: [facility] OR;  Service: Open Heart Surgery;  Laterality: N/A;  times three using  Left Internal Mammary Artery and Right Greater Saphenous Vein Graft harvested endoscopically; TEE  . Endarterectomy  10/31/2011    Procedure: ENDARTERECTOMY CAROTID;  Surgeon: Nada Libman, MD;  Location: [facility] OR;  Service: Vascular;  Laterality: Right;  with patch angioplasty   . Carotid endarterectomy  10-31-2011    right  . Colonoscopy N/A 10/30/2012    Procedure: COLONOSCOPY;  Surgeon: Shirley Friar, MD;  Location: [facility] ENDOSCOPY;  Service: Endoscopy;  Laterality: N/A;  .  Left heart catheterization with coronary angiogram N/A 09/04/2011    Procedure: LEFT HEART CATHETERIZATION WITH CORONARY ANGIOGRAM;  Surgeon: Lesleigh NoeHenry W Smith III, MD;  [facility];  Service: Cardiovascular;  Laterality: N/A;  . Left heart cath N/A 04/03/2012    Procedure: LEFT HEART CATH;  Surgeon: Lennette Biharihomas A Kelly, MD;  [facility];  Service: Cardiovascular;  Laterality: N/A;    History   Social History  . Marital Status: Married    Spouse Name: N/A  . Number of Children: N/A  . Years of Education: N/A   Occupational History  .  retired    Social History Main Topics  . Smoking status: Current Some Day Smoker -- 1.00 packs/day for 64 years    Types: Cigarettes  . Smokeless tobacco: Never Used  . 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 Drug Use: No  . Sexual Activity: Not on file   Other Topics Concern  . Not on file   Social History Narrative    Family History  Problem Relation Age of Onset  . Cancer Mother   . Tuberculosis Mother   . Heart disease Father     He did NOT have it before age 76      Medication List       This list is accurate as of: 08/07/14 11:59 PM.  Always use your most recent med list.               acetaminophen 325 MG tablet  Commonly known as:  TYLENOL  Take 1 tablet (325 mg total) by mouth every 6 (six) hours.     albuterol (2.5 MG/3ML) 0.083% nebulizer solution  Commonly known as:  PROVENTIL  Take 2.5 mg by nebulization 3 (three) times daily. And Q6H PRN     atorvastatin 10 MG tablet  Commonly known as:  LIPITOR  Take 1 tablet (10 mg total) by mouth daily at 6 PM.     buPROPion 300 MG 24 hr tablet  Commonly known as:  WELLBUTRIN XL  Take 300 mg by mouth daily.     carvedilol 3.125 MG tablet  Commonly known as:  COREG  Take 1 tablet (3.125 mg total) by mouth 2 (two) times daily with a meal.     cephALEXin 250 MG capsule  Commonly known as:  KEFLEX  Take 1 capsule (250 mg total) by mouth every 8 (eight) hours. For 4 more days.     colchicine 0.6 MG tablet  Take 0.6 mg by mouth daily.     docusate sodium 100 MG capsule  Commonly known as:  COLACE  Take 1 capsule (100 mg total) by mouth 2 (two) times daily.     dronabinol 5 MG capsule  Commonly known as:  MARINOL  Take 1 capsule (5 mg total) by mouth daily before lunch.     feeding supplement (ENSURE COMPLETE) Liqd  Take 237 mLs by mouth 2 (two) times daily between meals.     furosemide  40 MG tablet  Commonly known as:  LASIX  Take 1 tablet (40 mg total) by mouth daily.     guaiFENesin 600 MG 12 hr tablet  Commonly known as:  MUCINEX  Take 600 mg by mouth 2 (two) times daily as needed.     mirtazapine 7.5 MG tablet  Commonly known as:  REMERON  Take 7.5 mg by mouth at bedtime.     mometasone-formoterol 100-5 MCG/ACT Aero  Commonly known as:  DULERA  Inhale 2 puffs into the lungs 2 (two) times daily.     nicotine 21 mg/24hr patch  Commonly known as:  NICODERM CQ - dosed in mg/24 hours  Place 1 patch (21 mg total) onto the skin daily.     nitroGLYCERIN 0.4 MG SL tablet  Commonly known as:  NITROSTAT  Place 0.4 mg under the tongue every 5 (five) minutes as needed for chest pain.     oxyCODONE 5 MG immediate release tablet  Commonly known as:  Oxy IR/ROXICODONE  Take 1 tablet (5 mg total) by mouth every 6 (six) hours as needed for severe pain.     pantoprazole 40 MG tablet  Commonly known as:  PROTONIX  Take 1 tablet (40 mg total) by mouth daily.     polyethylene glycol packet  Commonly known as:  MIRALAX / GLYCOLAX  Take 17 g by mouth daily.     Potassium Chloride ER 20 MEQ Tbcr  Take 20 mEq by mouth 2 (two) times daily.     saccharomyces boulardii 250 MG capsule  Commonly known as:  FLORASTOR  Take 1 capsule (250 mg total) by mouth 2 (two) times daily.     sennosides 8.8 MG/5ML syrup  Commonly known as:  SENOKOT  Take 5 mLs by mouth 2 (two) times daily.     tiotropium 18 MCG inhalation capsule  Commonly known as:  SPIRIVA  Place 1 capsule (18 mcg total) into inhaler and inhale daily.     warfarin 1 MG tablet  Commonly known as:  COUMADIN  Take 2.5 mg by mouth daily.        Physical Exam  BP 112/66 mmHg  Pulse 94  Temp(Src) 98 F (36.7 C)  Resp 16  SpO2 95%  Constitutional: Very frail elderly male in no acute distress. Conversant and pleasant  HEENT: Normocephalic and atraumatic. PERRL. EOM intact. No scleral icterus. Oral mucosa  moist. Posterior pharynx clear of any exudate or lesions.  Neck: No lymphadeopathy, masses, or thyromegaly. No JVD or carotid bruits. Cardiac: Irregularly irregular without appreciable murmurs, rubs, or gallops. Distal pulses intact. Trace pitting edema Lungs: No respiratory distress. Breath sounds diminished bilaterally w/o crackle, rhonchi, or wheezes. Abdomen: Audible bowel sounds in all quadrants. Non-distended and nontender. Musculoskeletal: limited ROM throughout with generalized weakness.  Skin: Warm, very dry and flaky.  Neurological: Alert and oriented to self Psychiatric: appropriate mood and affect  Labs Reviewed  Lab Results  Component Value Date   INR 1.5* 08/07/2014   INR 1.9* 07/31/2014   INR 1.4* 07/24/2014   Assessment & Plan 1. Pulmonary embolus: Probable 2. Paroxysmal a-fib 3. Subtherapeutic international normalized ratio (INR) INR 1.5 today. Will increase coumadin to 2.5mg  daily and recheck INR on 08/14/14. Continue to monitor his status.  Family/Staff Communication Plan of care discussed with patient and nursing staff. Patient and nursing staff verbalized understanding and agree with plan of care. No additional questions or concerns reported.    Loura Back, MSN, AGNP-C [facility] [address] [place], [place] [address] [phone] [8am-5pm] After hours: [phone]

## 2014-08-09 ENCOUNTER — Encounter: Payer: Self-pay | Admitting: Interventional Cardiology

## 2014-08-11 ENCOUNTER — Encounter: Payer: Self-pay | Admitting: Registered Nurse

## 2014-08-11 ENCOUNTER — Non-Acute Institutional Stay (SKILLED_NURSING_FACILITY): Payer: Medicare PPO | Admitting: Registered Nurse

## 2014-08-11 DIAGNOSIS — R600 Localized edema: Secondary | ICD-10-CM

## 2014-08-11 DIAGNOSIS — R062 Wheezing: Secondary | ICD-10-CM | POA: Diagnosis not present

## 2014-08-11 NOTE — Progress Notes (Signed)
Patient ID: Nathan [name], male   DOB: 1941-05-06, 74 y.o.   MRN: [id number]   Place of Service: [facility]  No Known Allergies  Code Status: Full Code  Goals of Care: Longevity/STR  Chief Complaint  Patient presents with  . Acute Visit    BLE edema    HPI  74 y.o. male with  multiple complex PMH including CHF, CAD s/p CABG, HTN, HLD, COPD with ongoing tobacco abuse, depression, severe protein-calorie malnutrition is being seen for an acute visit at the request of family and nursing staff for increased swelling of BLE. Seen in therapy room today. Patient stated that his legs are more swollen than usual. Also reported having shortness of breath more than usual. Denies any other concerns. He denies headache, dizziness, chest pain, palpitation, PND, abdominal pain, n/v.   Past Medical History  Diagnosis Date  . Aorto-iliac disease     a. s/p bilat with stent 2007 - followed by [facility].  . CAD (coronary artery disease)     a. 10/2011 s/p CABG x4 (LIMA-LAD, SVG-Diag, SVG-OM1 and SVG-PDA)  b. 03/2012 NSTEMI s/p LHC SVG-RCA and LIMA-LAD but total occlusion of SVG-OM as well as SVG-diag. RX therapy recommended   . Hypertension   . ED (erectile dysfunction)   . COPD, severe   . Hyperlipidemia   . Stroke   . GERD (gastroesophageal reflux disease)   . Gout   . Paroxysmal atrial flutter     ablation  . Chronic combined systolic and diastolic CHF (congestive heart failure)     a. Last echo 04/2014: EF 40-45%.  Marland Kitchen PAF (paroxysmal atrial fibrillation)   . CKD (chronic kidney disease), stage III   . Tobacco abuse   . History of noncompliance with medical treatment   . Carotid artery disease     a. RCEA 2013 - followed by [facility].  . Anemia   . Lower GI bleed     a. LGIB 10/2012: Colonoscopy performed June 21 showed blood in the colon but no definite source identified, diverticular versus AVM suspected. Aspirin stopped.  . Mitral regurgitation   . Hypertensive heart disease      Past Surgical History  Procedure Laterality Date  . Spinal fusion    . Hand surgery    . Neck fusion  1975  . Eye surgery    . Coronary artery bypass graft  10/31/2011    Procedure: CORONARY ARTERY BYPASS GRAFTING (CABG);  Surgeon: Delight Ovens, MD;  Location: [facility] OR;  Service: Open Heart Surgery;  Laterality: N/A;  times three using  Left Internal Mammary Artery and Right Greater Saphenous Vein Graft harvested endoscopically; TEE  . Endarterectomy  10/31/2011    Procedure: ENDARTERECTOMY CAROTID;  Surgeon: Nada Libman, MD;  Location: [facility] OR;  Service: Vascular;  Laterality: Right;  with patch angioplasty   . Carotid endarterectomy  10-31-2011    right  . Colonoscopy N/A 10/30/2012    Procedure: COLONOSCOPY;  Surgeon: Shirley Friar, MD;  Location: [facility] ENDOSCOPY;  Service: Endoscopy;  Laterality: N/A;  . Left heart catheterization with coronary angiogram N/A 09/04/2011    Procedure: LEFT HEART CATHETERIZATION WITH CORONARY ANGIOGRAM;  Surgeon: Lesleigh Noe, MD;  Location: [facility] CATH LAB;  Service: Cardiovascular;  Laterality: N/A;  . Left heart cath N/A 04/03/2012    Procedure: LEFT HEART CATH;  Surgeon: Lennette Bihari, MD;  Location: [facility] CATH LAB;  Service: Cardiovascular;  Laterality: N/A;    History  Social History  . Marital Status: Married    Spouse Name: N/A  . Number of Children: N/A  . Years of Education: N/A   Occupational History  . retired    Social History Main Topics  . Smoking status: Current Some Day Smoker -- 1.00 packs/day for 64 years    Types: Cigarettes  . Smokeless tobacco: Never Used  . 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 Drug Use: No  . Sexual Activity: Not on file   Other Topics Concern  . Not on file   Social History Narrative    Family History  Problem Relation Age of Onset  . Cancer Mother   . Tuberculosis  Mother   . Heart disease Father     He did NOT have it before age 75      Medication List       This list is accurate as of: 08/11/14  4:50 PM.  Always use your most recent med list.               acetaminophen 325 MG tablet  Commonly known as:  TYLENOL  Take 1 tablet (325 mg total) by mouth every 6 (six) hours.     albuterol (2.5 MG/3ML) 0.083% nebulizer solution  Commonly known as:  PROVENTIL  Take 2.5 mg by nebulization 3 (three) times daily. And Q6H PRN     atorvastatin 10 MG tablet  Commonly known as:  LIPITOR  Take 1 tablet (10 mg total) by mouth daily at 6 PM.     buPROPion 300 MG 24 hr tablet  Commonly known as:  WELLBUTRIN XL  Take 300 mg by mouth daily.     carvedilol 3.125 MG tablet  Commonly known as:  COREG  Take 1 tablet (3.125 mg total) by mouth 2 (two) times daily with a meal.     colchicine 0.6 MG tablet  Take 0.6 mg by mouth daily.     docusate sodium 100 MG capsule  Commonly known as:  COLACE  Take 1 capsule (100 mg total) by mouth 2 (two) times daily.     dronabinol 5 MG capsule  Commonly known as:  MARINOL  Take 1 capsule (5 mg total) by mouth daily before lunch.     feeding supplement (ENSURE COMPLETE) Liqd  Take 237 mLs by mouth 2 (two) times daily between meals.     furosemide 40 MG tablet  Commonly known as:  LASIX  Take 1 tablet (40 mg total) by mouth daily.     guaiFENesin 600 MG 12 hr tablet  Commonly known as:  MUCINEX  Take 600 mg by mouth 2 (two) times daily as needed.     mirtazapine 7.5 MG tablet  Commonly known as:  REMERON  Take 7.5 mg by mouth at bedtime.     mometasone-formoterol 100-5 MCG/ACT Aero  Commonly known as:  DULERA  Inhale 2 puffs into the lungs 2 (two) times daily.     nicotine 21 mg/24hr patch  Commonly known as:  NICODERM CQ - dosed in mg/24 hours  Place 1 patch (21 mg total) onto the skin daily.     nitroGLYCERIN 0.4 MG SL tablet  Commonly known as:  NITROSTAT  Place 0.4 mg under the tongue every  5 (five) minutes as needed for chest pain.  oxyCODONE 5 MG immediate release tablet  Commonly known as:  Oxy IR/ROXICODONE  Take 1 tablet (5 mg total) by mouth every 6 (six) hours as needed for severe pain.     pantoprazole 40 MG tablet  Commonly known as:  PROTONIX  Take 1 tablet (40 mg total) by mouth daily.     polyethylene glycol packet  Commonly known as:  MIRALAX / GLYCOLAX  Take 17 g by mouth daily.     Potassium Chloride ER 20 MEQ Tbcr  Take 20 mEq by mouth 2 (two) times daily.     saccharomyces boulardii 250 MG capsule  Commonly known as:  FLORASTOR  Take 1 capsule (250 mg total) by mouth 2 (two) times daily.     sennosides 8.8 MG/5ML syrup  Commonly known as:  SENOKOT  Take 5 mLs by mouth 2 (two) times daily.     tiotropium 18 MCG inhalation capsule  Commonly known as:  SPIRIVA  Place 1 capsule (18 mcg total) into inhaler and inhale daily.     warfarin 1 MG tablet  Commonly known as:  COUMADIN  Take 2.5 mg by mouth daily.        Physical Exam  BP 131/83 mmHg  Pulse 81  Temp(Src) 98.1 F (36.7 C)  Resp 20  Wt 157 lb 12.8 oz (71.578 kg)  SpO2 97%  Constitutional: Very frail elderly male in no acute distress. Conversant and pleasant  HEENT: Normocephalic and atraumatic. PERRL. EOM intact. No scleral icterus. Oral mucosa moist. Posterior pharynx clear of any exudate or lesions.  Neck: No lymphadeopathy, masses, or thyromegaly. No JVD or carotid bruits. Cardiac: Irregularly irregular without appreciable murmurs, rubs, or gallops. Distal pulses intact. 2+ pitting edema bilaterally  L>R.  Lungs: No respiratory distress. Poor airway movement wheezes on upper lobes. Abdomen: Audible bowel sounds in all quadrants. Non-distended and nontender. Musculoskeletal: able to move all extremities with generalized weakness Skin: Warm, very dry and flaky.  Neurological: Alert and oriented to self Psychiatric: appropriate mood and affect  Labs Reviewed CBC Latest  Ref Rng 07/10/2014 07/09/2014 07/08/2014  WBC 4.0 - 10.5 K/uL 10.7(H) 10.1 7.4  Hemoglobin 13.0 - 17.0 g/dL 10.0(L) 10.3(L) 10.0(L)  Hematocrit 39.0 - 52.0 % 33.7(L) 33.3(L) 33.3(L)  Platelets 150 - 400 K/uL 307 268 220    CMP Latest Ref Rng 07/09/2014 07/08/2014 07/06/2014  Glucose 70 - 99 mg/dL 94 88 161(W144(H)  BUN 6 - 23 mg/dL 96(E25(H) 45(W28(H) 09(W64(H)  Creatinine 0.50 - 1.35 mg/dL 1.191.19 1.471.23 8.29(F1.69(H)  Sodium 135 - 145 mmol/L 134(L) 139 141  Potassium 3.5 - 5.1 mmol/L 4.2 3.9 3.0(L)  Chloride 96 - 112 mmol/L 100 103 107  CO2 19 - 32 mmol/L 27 28 28   Calcium 8.4 - 10.5 mg/dL 7.7(L) 7.8(L) 7.6(L)  Total Protein 6.0 - 8.3 g/dL - - -  Total Bilirubin 0.3 - 1.2 mg/dL - - -  Alkaline Phos 39 - 117 U/L - - -  AST 0 - 37 U/L - - -  ALT 0 - 53 U/L - - -    Assessment & Plan 1. Bilateral leg edema Increase lasix to 40mg  twice daily x 3 days then resume 40mg  daily. Recheck bmp on 08/14/14. Knee high TED hose to BLE while awake. Continue to monitor  2. Wheezing Continue abuterol via neb three times daily and every six hours as needed for shob/wheezing. Continue to monitor his status.  Family/Staff Communication Plan of care discussed with patient and nursing staff. Patient and nursing  staff verbalized understanding and agree with plan of care. No additional questions or concerns reported.    Loura Back, MSN, AGNP-C [facility] [address] [place], [place] [address] [phone] [8am-5pm] After hours: [phone]

## 2014-08-14 ENCOUNTER — Non-Acute Institutional Stay (SKILLED_NURSING_FACILITY): Payer: Medicare PPO | Admitting: Registered Nurse

## 2014-08-14 DIAGNOSIS — J441 Chronic obstructive pulmonary disease with (acute) exacerbation: Secondary | ICD-10-CM

## 2014-08-14 DIAGNOSIS — E43 Unspecified severe protein-calorie malnutrition: Secondary | ICD-10-CM

## 2014-08-14 DIAGNOSIS — S3210XD Unspecified fracture of sacrum, subsequent encounter for fracture with routine healing: Secondary | ICD-10-CM

## 2014-08-14 DIAGNOSIS — I10 Essential (primary) hypertension: Secondary | ICD-10-CM | POA: Diagnosis not present

## 2014-08-14 DIAGNOSIS — L89312 Pressure ulcer of right buttock, stage 2: Secondary | ICD-10-CM

## 2014-08-14 DIAGNOSIS — F32A Depression, unspecified: Secondary | ICD-10-CM

## 2014-08-14 DIAGNOSIS — N183 Chronic kidney disease, stage 3 unspecified: Secondary | ICD-10-CM

## 2014-08-14 DIAGNOSIS — I2699 Other pulmonary embolism without acute cor pulmonale: Secondary | ICD-10-CM

## 2014-08-14 DIAGNOSIS — G4700 Insomnia, unspecified: Secondary | ICD-10-CM

## 2014-08-14 DIAGNOSIS — J189 Pneumonia, unspecified organism: Secondary | ICD-10-CM

## 2014-08-14 DIAGNOSIS — R5381 Other malaise: Secondary | ICD-10-CM | POA: Diagnosis not present

## 2014-08-14 DIAGNOSIS — E785 Hyperlipidemia, unspecified: Secondary | ICD-10-CM

## 2014-08-14 DIAGNOSIS — I5042 Chronic combined systolic (congestive) and diastolic (congestive) heart failure: Secondary | ICD-10-CM

## 2014-08-14 DIAGNOSIS — I480 Paroxysmal atrial fibrillation: Secondary | ICD-10-CM

## 2014-08-14 DIAGNOSIS — Z951 Presence of aortocoronary bypass graft: Secondary | ICD-10-CM

## 2014-08-14 DIAGNOSIS — R791 Abnormal coagulation profile: Secondary | ICD-10-CM | POA: Diagnosis not present

## 2014-08-14 DIAGNOSIS — F329 Major depressive disorder, single episode, unspecified: Secondary | ICD-10-CM | POA: Diagnosis not present

## 2014-08-14 DIAGNOSIS — W19XXXD Unspecified fall, subsequent encounter: Secondary | ICD-10-CM | POA: Diagnosis not present

## 2014-08-14 DIAGNOSIS — F17200 Nicotine dependence, unspecified, uncomplicated: Secondary | ICD-10-CM

## 2014-08-14 DIAGNOSIS — Z720 Tobacco use: Secondary | ICD-10-CM | POA: Diagnosis not present

## 2014-08-14 DIAGNOSIS — Y92129 Unspecified place in nursing home as the place of occurrence of the external cause: Secondary | ICD-10-CM

## 2014-08-14 LAB — BASIC METABOLIC PANEL
BUN: 24 mg/dL — AB (ref 4–21)
Creatinine: 1.4 mg/dL — AB (ref 0.6–1.3)
POTASSIUM: 4.2 mmol/L (ref 3.4–5.3)
SODIUM: 135 mmol/L — AB (ref 137–147)

## 2014-08-14 LAB — POCT INR: INR: 1.8 — AB (ref 0.9–1.1)

## 2014-08-15 ENCOUNTER — Encounter: Payer: Self-pay | Admitting: Registered Nurse

## 2014-08-15 NOTE — Progress Notes (Signed)
This encounter was created in error - please disregard.

## 2014-08-15 NOTE — Progress Notes (Signed)
Patient ID: Nathan [name], male   DOB: July 01, 1940, 74 y.o.   MRN: [id number]   Place of Service: [facility]  No Known Allergies  Code Status: Full Code  Goals of Care: Longevity/STR  Chief Complaint  Patient presents with  . Medical Management of Chronic Issues    PE, HTN, sacral fx, tobacco abuse, p. afib, COPD, stage 2 sacral ulcer, severe protein calorie malnutrition, depression.    HPI 74 y.o. male with multiple complex PMH including CHF, CAD s/p CABG, HTN, HLD, COPD with ongoing tobacco abuse, depression, severe protein-calorie malnutrition, nondisplaced right obturator ring fractures and nondisplaced right sacral ala fracture s/p fall, probably PE, paroxymal afib among others is being seen for a routine visit for management of his chronic issues. Has 9-lb weight loss and a fall over the past 30 days. No recent change in behavior or functional status reported. He was recently seen for increased swelling a few days ago. Over this past weekend, he developed crackles and increased shortness of breath, x-ray was ordered by on-call provider and was positive for RLL pneumonia. Levoquin  daily x 10 days was ordered on 08/13/14. BNP was also checked on 08/14/14 and elevated 1072.9. See in room today, reported not feeling well-increased shortness of breath. Denies any other concerns.   Review of Systems Constitutional: Negative for fever and chills HENT: Negative for ear pain and sore throat.  Eyes: Negative for eye pain, eye discharge, and visual disturbance  Cardiovascular: Negative for chest pain and palpitations. Positive for leg swelling  Respiratory: Negative for shortness of breath. Positive for cough, congestion, and wheezing  Gastrointestinal: Negative for nausea and vomiting. Negative for abdominal pain, diarrhea and constipation.  Genitourinary: Negative for  dysuria, frequency, urgency, and hematuria Musculoskeletal: Negative for uncontrolled pain Neurological:  Negative for dizziness and headache Skin: Negative for rash. Positive for sore "on bottom"  Psychiatric: Negative for depression.   Past Medical History  Diagnosis Date  . Aorto-iliac disease     a. s/p bilat with stent 2007 - followed by [facility].  . CAD (coronary artery disease)     a. 10/2011 s/p CABG x4 (LIMA-LAD, SVG-Diag, SVG-OM1 and SVG-PDA)  b. 03/2012 NSTEMI s/p LHC SVG-RCA and LIMA-LAD but total occlusion of SVG-OM as well as SVG-diag. RX therapy recommended   . Hypertension   . ED (erectile dysfunction)   . COPD, severe   . Hyperlipidemia   . Stroke   . GERD (gastroesophageal reflux disease)   . Gout   . Paroxysmal atrial flutter     ablation  . Chronic combined systolic and diastolic CHF (congestive heart failure)     a. Last echo 04/2014: EF 40-45%.  Marland Kitchen PAF (paroxysmal atrial fibrillation)   . CKD (chronic kidney disease), stage III   . Tobacco abuse   . History of noncompliance with medical treatment   . Carotid artery disease     a. RCEA 2013 - followed by [facility].  . Anemia   . Lower GI bleed     a. LGIB 10/2012: Colonoscopy performed June 21 showed blood in the colon but no definite source identified, diverticular versus AVM suspected. Aspirin stopped.  . Mitral regurgitation   . Hypertensive heart disease     Past Surgical History  Procedure Laterality Date  . Spinal fusion    . Hand surgery    . Neck fusion  1975  . Eye surgery    . Coronary artery bypass graft  10/31/2011    Procedure:  CORONARY ARTERY BYPASS GRAFTING (CABG);  Surgeon: Delight Ovens, MD;  Location: [facility] OR;  Service: Open Heart Surgery;  Laterality: N/A;  times three using  Left Internal Mammary Artery and Right Greater Saphenous Vein Graft harvested endoscopically; TEE  . Endarterectomy  10/31/2011    Procedure: ENDARTERECTOMY CAROTID;  Surgeon: Nada Libman, MD;  Location: [facility] OR;  Service: Vascular;  Laterality: Right;  with patch angioplasty   . Carotid endarterectomy  10-31-2011    right  .  Colonoscopy N/A 10/30/2012    Procedure: COLONOSCOPY;  Surgeon: Shirley Friar, MD;  Location: [facility] ENDOSCOPY;  Service: Endoscopy;  Laterality: N/A;  . Left heart catheterization with coronary angiogram N/A 09/04/2011    Procedure: LEFT HEART CATHETERIZATION WITH CORONARY ANGIOGRAM;  Surgeon: Lesleigh Noe, MD;  Location: [facility] CATH LAB;  Service: Cardiovascular;  Laterality: N/A;  . Left heart cath N/A 04/03/2012    Procedure: LEFT HEART CATH;  Surgeon: Lennette Bihari, MD;  Location: [facility] CATH LAB;  Service: Cardiovascular;  Laterality: N/A;    History  Substance Use Topics  . Smoking status: Current Some Day Smoker -- 1.00 packs/day for 64 years    Types: Cigarettes  . Smokeless tobacco: Never Used  . 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 Cancer Mother   . Tuberculosis Mother   . Heart disease Father     He did NOT have it before age 26      Medication List       This list is accurate as of: 08/14/14 11:59 PM.  Always use your most recent med list.               acetaminophen 325 MG tablet  Commonly known as:  TYLENOL  Take 1 tablet (325 mg total) by mouth every 6 (six) hours.     albuterol (2.5 MG/3ML) 0.083% nebulizer solution  Commonly known as:  PROVENTIL  Take 2.5 mg by nebulization 3 (three) times daily. And Q6H PRN     atorvastatin 10 MG tablet  Commonly known as:  LIPITOR  Take 1 tablet (10 mg total) by mouth daily at 6 PM.     buPROPion 300 MG 24 hr tablet  Commonly known as:  WELLBUTRIN XL  Take 300 mg by mouth daily.     carvedilol 3.125 MG tablet  Commonly known as:  COREG  Take 1 tablet (3.125 mg total) by mouth 2 (two) times daily with a meal.     colchicine 0.6 MG tablet  Take 0.6 mg by mouth daily.     docusate sodium 100 MG capsule  Commonly known as:  COLACE  Take 1 capsule  (100 mg total) by mouth 2 (two) times daily.     feeding supplement (ENSURE COMPLETE) Liqd  Take 237 mLs by mouth 2 (two) times daily between meals.     furosemide 40 MG tablet  Commonly known as:  LASIX  Take 1 tablet (40 mg total) by mouth daily.     guaiFENesin 600 MG 12 hr tablet  Commonly known as:  MUCINEX  Take 600 mg by mouth 2 (two) times daily as needed.     mirtazapine 7.5 MG tablet  Commonly known as:  REMERON  Take 7.5 mg by  mouth at bedtime.     mometasone-formoterol 100-5 MCG/ACT Aero  Commonly known as:  DULERA  Inhale 2 puffs into the lungs 2 (two) times daily.     nicotine 21 mg/24hr patch  Commonly known as:  NICODERM CQ - dosed in mg/24 hours  Place 1 patch (21 mg total) onto the skin daily.     nitroGLYCERIN 0.4 MG SL tablet  Commonly known as:  NITROSTAT  Place 0.4 mg under the tongue every 5 (five) minutes as needed for chest pain.     oxyCODONE 5 MG immediate release tablet  Commonly known as:  Oxy IR/ROXICODONE  Take 1 tablet (5 mg total) by mouth every 6 (six) hours as needed for severe pain.     pantoprazole 40 MG tablet  Commonly known as:  PROTONIX  Take 1 tablet (40 mg total) by mouth daily.     polyethylene glycol packet  Commonly known as:  MIRALAX / GLYCOLAX  Take 17 g by mouth daily.     Potassium Chloride ER 20 MEQ Tbcr  Take 20 mEq by mouth 2 (two) times daily.     saccharomyces boulardii 250 MG capsule  Commonly known as:  FLORASTOR  Take 1 capsule (250 mg total) by mouth 2 (two) times daily.     sennosides 8.8 MG/5ML syrup  Commonly known as:  SENOKOT  Take 5 mLs by mouth 2 (two) times daily.     tiotropium 18 MCG inhalation capsule  Commonly known as:  SPIRIVA  Place 1 capsule (18 mcg total) into inhaler and inhale daily.     warfarin 1 MG tablet  Commonly known as:  COUMADIN  Take 2.5 mg by mouth daily.        Physical Exam  BP 135/88 mmHg  Pulse 78  Temp(Src) 97.6 F (36.4 C)  Resp 16  Ht  (1.854 m)   Wt 156 lb 6.4 oz (70.943 kg)  BMI 20.64 kg/m2  SpO2 96%  Constitutional: Very frail elderly male in mild distress. Ill-appearing, but conversant and pleasant HEENT: Normocephalic and atraumatic. PERRL. EOM intact. No scleral icterus. No nasal discharge or sinus tenderness. Oral mucosa moist. Posterior pharynx clear of any exudate or lesions.  Neck: Supple and nontender. No lymphadenopathy, masses, or thyromegaly. No JVD or carotid bruits. Cardiac: Normal S1, S2. RRR without appreciable murmurs, rubs, or gallops. Distal pulses intact. 2+ pitting edema of BLE Lungs: Labored respiration. Breath sounds diminished bilaterally with rhonchi/coarse crackles throughout lung fields. Abdomen: Audible bowel sounds in all quadrants. Nontender to palpation. Nondistended. Musculoskeletal: able to move all extremities. Generalized weakness.  Skin: Warm and flaky. No rash. Stage 2 pressure of right buttock is now unstageable, but measuring smaller 2x0.7cm-100% covered with firmly adherent pale yellow slough. Periwound erythematous. Neurological: Arousable. Oriented to self Psychiatric: appropriate mood and affect to situation.   Labs Reviewed  CBC Latest Ref Rng 07/10/2014 07/09/2014 07/08/2014  WBC 4.0 - 10.5 K/uL 10.7(H) 10.1 7.4  Hemoglobin 13.0 - 17.0 g/dL 10.0(L) 10.3(L) 10.0(L)  Hematocrit 39.0 - 52.0 % 33.7(L) 33.3(L) 33.3(L)  Platelets 150 - 400 K/uL 307 268 220    CMP Latest Ref Rng 08/14/2014 07/09/2014 07/08/2014  Glucose 70 - 99 mg/dL - 94 88  BUN 4 - 21 mg/dL 16(X) 09(U) 04(V)  Creatinine 0.6 - 1.3 mg/dL 4.0(J) 8.11 9.14  Sodium 137 - 147 mmol/L 135(A) 134(L) 139  Potassium 3.4 - 5.3 mmol/L 4.2 4.2 3.9  Chloride 96 - 112 mmol/L - 100 103  CO2  19 - 32 mmol/L - 27 28  Calcium 8.4 - 10.5 mg/dL - 7.7(L) 7.8(L)  Total Protein 6.0 - 8.3 g/dL - - -  Total Bilirubin 0.3 - 1.2 mg/dL - - -  Alkaline Phos 39 - 117 U/L - - -  AST 0 - 37 U/L - - -  ALT 0 - 53 U/L - - -    Lab Results  Component  Value Date   TSH 0.478 04/21/2014    Lipid Panel     Component Value Date/Time   CHOL 140 04/04/2012 0555   TRIG 183* 04/04/2012 0555   HDL 25* 04/04/2012 0555   CHOLHDL 5.6 04/04/2012 0555   VLDL 37 04/04/2012 0555   LDLCALC 78 04/04/2012 0555   Lab Results  Component Value Date   INR 1.8* 08/14/2014   INR 1.5* 08/07/2014   INR 1.9* 07/31/2014   Diagnostic Studies Reviewed  08/13/14: CXR: right lower lobe pneumonia  07/10/14: CXR: Mild interval improvement in pulmonary edema, with persistent small to moderate right and small left pleural effusions.  07/04/14: KUB: similar diffuse gaseous distention of the colon compatible with an ileus. No interval change in the degree of distention. No free air evident on the decubitus view. Vascular stents noted in iliac vessels. No acute osseous finding or abnormal calcification  06/26/14: VQ Scan: Intermediate probability for pulmonary embolism by PIOPED II. Bibasilar airspace disease and pleural effusions significantly limits the utility of V/Q scan.  06/26/13: 2D-Echo -Left ventricle: LVEF is approximately 40% with diffuse hypokinesis, worse in the inferior and posterior   wall. Wall thickness was increased in a pattern of mild LVH. - Mitral valve: There was moderate regurgitation. - Left atrium: The atrium was moderately dilated. - Right ventricle: Systolic function was mildly reduced. - Tricuspid valve: There was moderate regurgitation. - Pulmonary arteries: PA peak pressure: 34 mm Hg (S).  06/23/14: MRI Right hip:  1. Nondisplaced RIGHT obturator ring fractures. Complementary nondisplaced RIGHT sacral ala fracture. 2. Bilateral hip girdle muscular strain. 3. Anasarca up. 4. LEFT inguinal hernia containing a loop of sigmoid colon.  06/23/14: Head CT: Atrophy and microvascular ischemic disease without acute intracranial process.  Assessment & Plan 1. Essential hypertension Stable. Continue coreg 3.125mg  twice daily, lasix 40mg   daily and monitor his status  2. Acute on chronic systolic CHF (congestive heart failure) BNP 1072. Will increase lasix from 40mg  daily to 40mg  twice daily x 5 days and recheck bmp. Continue coreg 3.125mg  twice daily and potassium twice daily. Continue daily weight and continue to monitor his status.   3. Pulmonary embolus: Probable INR 1.8 today. Continue coumadin at 2.5mg  daily. Recheck INR 08/21/14 and continue to monitor his status  4. COPD exacerbation In the setting of PNA. Continue oxygen therapy at 3lmp to maintain 02 sat >88%. Start duoneb every four hours x5 days then Q4h as needed for shob/wheezing. Continue dulera 100/4mcg 2 puffs twice daily and spiriva daily. Start mucinex 600mg  twice daily x 5 days then twice daily as needed for cough and congestion. Incentive spirometer every 2-4 hours daily while awake. Continue to monitor his status.    5. Sacral fracture, closed, initial encounter With nondisplaced right obturator ring fractures. Conservative management. Continue tylenol 325mg  every six hours as needed with oxycodone 5mg  IR every six hours as needed for pain. Continue to work with PT/OT for gait/strength/balance training to restore/maximize function. Fall risk precautions  6. Chronic kidney disease (CKD), stage III (moderate) Avoid nephrotoxic agents and monitor renal  functions  7. Hyperlipidemia Continue lipitor 10mg  daily   8. S/P CABG (coronary artery bypass graft) Denies any chest pain. Continue coreg 3.125mg  twice daily, statin, and SL nitro as needed for chest pain. Continue to monitor his status  9. Tobacco use disorder Continue nicoderm 21mg  patch daily. Encourage smoking cessation   10. Protein-calorie malnutrition, severe 9-lb weight loss over the past 30 days. Continue  Remeron, current diet, and dietary supplement.   11. Paroxysmal a-fib Stable. RRR on exam. Continue beta blocker. INR 1.8. Continue coumadin at 2.5mg  daily and recheck INR  08/21/14. Continue to monitor his status.   12. Decubitus ulcer of buttock, stage 2, right Is now unstageable but measuring smaller: 2x0.7cm. Continue daily dressing change with NS, santyl, calcium alginate, and foam dressing. Continue pressure ulcer prophylaxis.   13. HCAP Continue to complete levoquin 500mg  daily x 10 days. See #4  14. Insomnia Stable. Continue remeron 7.5mg  daily at bedtime to help with sleep, depression, and appetite. Continue to monitor  15. Subtherapeutic INR INR 1.8 on 08/14/14. Continue coumadin 2.5mg  daily for now. Recheck INR 08/21/14  16. Fall at nursing home, subsequent encounter No recurrent fall since last fall. Continue fall risk precautions. Encourage patient to ask for help with transfer/ambulation.   17. Physical deconditioning Continue to work with PT/OT for gait/strenght/blance training to restore/maximize function. Continue fall and pressure ulcer precuations  18. Depression Continue wellbutrin 300mg  daily and remeron 7.5mg  daily. Continue to monitor for change in mood.   Diagnostic Studies/Labs Ordered: bmp in 1 week.    Family/Staff Communication Plan of care discussed with patient, wife, Lynden AngCathy,  and Education administratornursing staff. Patient, Lynden AngCathy, and nursing staff verbalized understanding and agree with plan of care. No additional questions or concerns reported.    Loura BackKim Amal Renbarger, MSN, AGNP-C [facility] [address] [place], [place] [address] [phone] [8am-5pm] After hours: [phone]

## 2014-08-21 ENCOUNTER — Non-Acute Institutional Stay (SKILLED_NURSING_FACILITY): Payer: Medicare PPO | Admitting: Registered Nurse

## 2014-08-21 DIAGNOSIS — I2699 Other pulmonary embolism without acute cor pulmonale: Secondary | ICD-10-CM

## 2014-08-21 DIAGNOSIS — W19XXXA Unspecified fall, initial encounter: Secondary | ICD-10-CM | POA: Diagnosis not present

## 2014-08-21 DIAGNOSIS — I480 Paroxysmal atrial fibrillation: Secondary | ICD-10-CM | POA: Diagnosis not present

## 2014-08-21 DIAGNOSIS — R791 Abnormal coagulation profile: Secondary | ICD-10-CM

## 2014-08-21 DIAGNOSIS — Y92129 Unspecified place in nursing home as the place of occurrence of the external cause: Secondary | ICD-10-CM

## 2014-08-21 LAB — BASIC METABOLIC PANEL
BUN: 25 mg/dL — AB (ref 4–21)
Creatinine: 1.5 mg/dL — AB (ref 0.6–1.3)
Glucose: 80 mg/dL
POTASSIUM: 4.2 mmol/L (ref 3.4–5.3)
Sodium: 139 mmol/L (ref 137–147)

## 2014-08-21 LAB — POCT INR: INR: 1.5 — AB (ref 0.9–1.1)

## 2014-08-21 NOTE — Progress Notes (Deleted)
Patient ID: Nathan [name], male   DOB: 1940/08/25, 74 y.o.   MRN: [id number] Patient ID: Nathan [name], male   DOB: 1940/05/14, 74 y.o.   MRN: [id number]   Place of Service: [facility]  No Known Allergies  Code Status: Full Code  Goals of Care: Longevity/STR  Chief Complaint  Patient presents with  . Acute Visit    subtherapeutic INR, fall    HPI 74 y.o. male with multiple complex PMH including CHF, CAD s/p CABG, HTN, HLD, COPD with ongoing tobacco abuse, depression, severe protein-calorie malnutrition, nondisplaced right obturator ring fractures and nondisplaced right sacral ala fracture s/p fall, probably PE, paroxymal afib among others is being seen for a routine visit for management of his chronic issues. Has 9-lb weight loss and a fall over the past 30 days. No recent change in behavior or functional status reported. He was recently seen for increased swelling a few days ago. Over this past weekend, he developed crackles and increased shortness of breath, x-ray was ordered by on-call provider and was positive for RLL pneumonia. Levoquin  daily x 10 days was ordered on 08/13/14. BNP was also checked on 08/14/14 and elevated 1072.9. See in room today, reported not feeling well-increased shortness of breath. Denies any other concerns.   Review of Systems Constitutional: Negative for fever and chills HENT: Negative for ear pain and sore throat.  Eyes: Negative for eye pain, eye discharge, and visual disturbance  Cardiovascular: Negative for chest pain and palpitations. Positive for leg swelling  Respiratory: Negative for shortness of breath. Positive for cough, congestion, and wheezing  Gastrointestinal: Negative for nausea and vomiting. Negative for abdominal pain, diarrhea and constipation.  Genitourinary: Negative for  dysuria, frequency, urgency, and hematuria Musculoskeletal: Negative for uncontrolled pain Neurological: Negative for dizziness and headache Skin:  Negative for rash. Positive for sore "on bottom"  Psychiatric: Negative for depression.   Past Medical History  Diagnosis Date  . Aorto-iliac disease     a. s/p bilat with stent 2007 - followed by [facility].  . CAD (coronary artery disease)     a. 10/2011 s/p CABG x4 (LIMA-LAD, SVG-Diag, SVG-OM1 and SVG-PDA)  b. 03/2012 NSTEMI s/p LHC SVG-RCA and LIMA-LAD but total occlusion of SVG-OM as well as SVG-diag. RX therapy recommended   . Hypertension   . ED (erectile dysfunction)   . COPD, severe   . Hyperlipidemia   . Stroke   . GERD (gastroesophageal reflux disease)   . Gout   . Paroxysmal atrial flutter     ablation  . Chronic combined systolic and diastolic CHF (congestive heart failure)     a. Last echo 04/2014: EF 40-45%.  Marland Kitchen PAF (paroxysmal atrial fibrillation)   . CKD (chronic kidney disease), stage III   . Tobacco abuse   . History of noncompliance with medical treatment   . Carotid artery disease     a. RCEA 2013 - followed by [facility].  . Anemia   . Lower GI bleed     a. LGIB 10/2012: Colonoscopy performed June 21 showed blood in the colon but no definite source identified, diverticular versus AVM suspected. Aspirin stopped.  . Mitral regurgitation   . Hypertensive heart disease     Past Surgical History  Procedure Laterality Date  . Spinal fusion    . Hand surgery    . Neck fusion  1975  . Eye surgery    . Coronary artery bypass graft  10/31/2011    Procedure: CORONARY ARTERY  BYPASS GRAFTING (CABG);  Surgeon: Delight Ovens, MD;  Location: [facility] OR;  Service: Open Heart Surgery;  Laterality: N/A;  times three using  Left Internal Mammary Artery and Right Greater Saphenous Vein Graft harvested endoscopically; TEE  . Endarterectomy  10/31/2011    Procedure: ENDARTERECTOMY CAROTID;  Surgeon: Nada Libman, MD;  Location: [facility] OR;  Service: Vascular;  Laterality: Right;  with patch angioplasty   . Carotid endarterectomy  10-31-2011    right  . Colonoscopy N/A 10/30/2012    Procedure:  COLONOSCOPY;  Surgeon: Shirley Friar, MD;  Location: [facility] ENDOSCOPY;  Service: Endoscopy;  Laterality: N/A;  . Left heart catheterization with coronary angiogram N/A 09/04/2011    Procedure: LEFT HEART CATHETERIZATION WITH CORONARY ANGIOGRAM;  Surgeon: Lesleigh Noe, MD;  Location: [facility] CATH LAB;  Service: Cardiovascular;  Laterality: N/A;  . Left heart cath N/A 04/03/2012    Procedure: LEFT HEART CATH;  Surgeon: Lennette Bihari, MD;  Location: [facility] CATH LAB;  Service: Cardiovascular;  Laterality: N/A;    History  Substance Use Topics  . Smoking status: Current Some Day Smoker -- 1.00 packs/day for 64 years    Types: Cigarettes  . Smokeless tobacco: Never Used  . 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 Cancer Mother   . Tuberculosis Mother   . Heart disease Father     He did NOT have it before age 10      Medication List       This list is accurate as of: 08/21/14  3:29 PM.  Always use your most recent med list.               acetaminophen 325 MG tablet  Commonly known as:  TYLENOL  Take 1 tablet (325 mg total) by mouth every 6 (six) hours.     albuterol (2.5 MG/3ML) 0.083% nebulizer solution  Commonly known as:  PROVENTIL  Take 2.5 mg by nebulization 3 (three) times daily. And Q6H PRN     atorvastatin 10 MG tablet  Commonly known as:  LIPITOR  Take 1 tablet (10 mg total) by mouth daily at 6 PM.     buPROPion 300 MG 24 hr tablet  Commonly known as:  WELLBUTRIN XL  Take 300 mg by mouth daily.     carvedilol 3.125 MG tablet  Commonly known as:  COREG  Take 1 tablet (3.125 mg total) by mouth 2 (two) times daily with a meal.     colchicine 0.6 MG tablet  Take 0.6 mg by mouth daily.     docusate sodium 100 MG capsule  Commonly known as:  COLACE  Take 1 capsule (100 mg total) by mouth 2 (two) times  daily.     feeding supplement (ENSURE COMPLETE) Liqd  Take 237 mLs by mouth 2 (two) times daily between meals.     furosemide 40 MG tablet  Commonly known as:  LASIX  Take 1 tablet (40 mg total) by mouth daily.     guaiFENesin 600 MG 12 hr tablet  Commonly known as:  MUCINEX  Take 600 mg by mouth 2 (two) times daily as needed.     mirtazapine 7.5 MG tablet  Commonly known as:  REMERON  Take 7.5 mg by mouth  at bedtime.     mometasone-formoterol 100-5 MCG/ACT Aero  Commonly known as:  DULERA  Inhale 2 puffs into the lungs 2 (two) times daily.     nicotine 21 mg/24hr patch  Commonly known as:  NICODERM CQ - dosed in mg/24 hours  Place 1 patch (21 mg total) onto the skin daily.     nitroGLYCERIN 0.4 MG SL tablet  Commonly known as:  NITROSTAT  Place 0.4 mg under the tongue every 5 (five) minutes as needed for chest pain.     oxyCODONE 5 MG immediate release tablet  Commonly known as:  Oxy IR/ROXICODONE  Take 1 tablet (5 mg total) by mouth every 6 (six) hours as needed for severe pain.     pantoprazole 40 MG tablet  Commonly known as:  PROTONIX  Take 1 tablet (40 mg total) by mouth daily.     polyethylene glycol packet  Commonly known as:  MIRALAX / GLYCOLAX  Take 17 g by mouth daily.     Potassium Chloride ER 20 MEQ Tbcr  Take 20 mEq by mouth 2 (two) times daily.     saccharomyces boulardii 250 MG capsule  Commonly known as:  FLORASTOR  Take 1 capsule (250 mg total) by mouth 2 (two) times daily.     sennosides 8.8 MG/5ML syrup  Commonly known as:  SENOKOT  Take 5 mLs by mouth 2 (two) times daily.     tiotropium 18 MCG inhalation capsule  Commonly known as:  SPIRIVA  Place 1 capsule (18 mcg total) into inhaler and inhale daily.     warfarin 1 MG tablet  Commonly known as:  COUMADIN  Take 3 mg by mouth daily.        Physical Exam  BP 113/67 mmHg  Pulse 95  Temp(Src) 97.8 F (36.6 C)  Resp 18  SpO2 97%  Constitutional: Very frail elderly male in mild  distress. Ill-appearing, but conversant and pleasant HEENT: Normocephalic and atraumatic. PERRL. EOM intact. No scleral icterus. No nasal discharge or sinus tenderness. Oral mucosa moist. Posterior pharynx clear of any exudate or lesions.  Neck: Supple and nontender. No lymphadenopathy, masses, or thyromegaly. No JVD or carotid bruits. Cardiac: Normal S1, S2. RRR without appreciable murmurs, rubs, or gallops. Distal pulses intact. 2+ pitting edema of BLE Lungs: Labored respiration. Breath sounds diminished bilaterally with rhonchi/coarse crackles throughout lung fields. Abdomen: Audible bowel sounds in all quadrants. Nontender to palpation. Nondistended. Musculoskeletal: able to move all extremities. Generalized weakness.  Skin: Warm and flaky. No rash. Stage 2 pressure of right buttock is now unstageable, but measuring smaller 2x0.7cm-100% covered with firmly adherent pale yellow slough. Periwound erythematous. Neurological: Arousable. Oriented to self Psychiatric: appropriate mood and affect to situation.   Labs Reviewed  CBC Latest Ref Rng 07/10/2014 07/09/2014 07/08/2014  WBC 4.0 - 10.5 K/uL 10.7(H) 10.1 7.4  Hemoglobin 13.0 - 17.0 g/dL 10.0(L) 10.3(L) 10.0(L)  Hematocrit 39.0 - 52.0 % 33.7(L) 33.3(L) 33.3(L)  Platelets 150 - 400 K/uL 307 268 220    CMP Latest Ref Rng 08/14/2014 07/09/2014 07/08/2014  Glucose 70 - 99 mg/dL - 94 88  BUN 4 - 21 mg/dL 16(X) 09(U) 04(V)  Creatinine 0.6 - 1.3 mg/dL 4.0(J) 8.11 9.14  Sodium 137 - 147 mmol/L 135(A) 134(L) 139  Potassium 3.4 - 5.3 mmol/L 4.2 4.2 3.9  Chloride 96 - 112 mmol/L - 100 103  CO2 19 - 32 mmol/L - 27 28  Calcium 8.4 - 10.5 mg/dL - 7.7(L) 7.8(L)  Total Protein  6.0 - 8.3 g/dL - - -  Total Bilirubin 0.3 - 1.2 mg/dL - - -  Alkaline Phos 39 - 117 U/L - - -  AST 0 - 37 U/L - - -  ALT 0 - 53 U/L - - -    Lab Results  Component Value Date   TSH 0.478 04/21/2014    Lipid Panel     Component Value Date/Time   CHOL 140 04/04/2012 0555     TRIG 183* 04/04/2012 0555   HDL 25* 04/04/2012 0555   CHOLHDL 5.6 04/04/2012 0555   VLDL 37 04/04/2012 0555   LDLCALC 78 04/04/2012 0555   Lab Results  Component Value Date   INR 1.5* 08/21/2014   INR 1.8* 08/14/2014   INR 1.5* 08/07/2014   Diagnostic Studies Reviewed  08/13/14: CXR: right lower lobe pneumonia  07/10/14: CXR: Mild interval improvement in pulmonary edema, with persistent small to moderate right and small left pleural effusions.  07/04/14: KUB: similar diffuse gaseous distention of the colon compatible with an ileus. No interval change in the degree of distention. No free air evident on the decubitus view. Vascular stents noted in iliac vessels. No acute osseous finding or abnormal calcification  06/26/14: VQ Scan: Intermediate probability for pulmonary embolism by PIOPED II. Bibasilar airspace disease and pleural effusions significantly limits the utility of V/Q scan.  06/26/13: 2D-Echo -Left ventricle: LVEF is approximately 40% with diffuse hypokinesis, worse in the inferior and posterior   wall. Wall thickness was increased in a pattern of mild LVH. - Mitral valve: There was moderate regurgitation. - Left atrium: The atrium was moderately dilated. - Right ventricle: Systolic function was mildly reduced. - Tricuspid valve: There was moderate regurgitation. - Pulmonary arteries: PA peak pressure: 34 mm Hg (S).  06/23/14: MRI Right hip:  1. Nondisplaced RIGHT obturator ring fractures. Complementary nondisplaced RIGHT sacral ala fracture. 2. Bilateral hip girdle muscular strain. 3. Anasarca up. 4. LEFT inguinal hernia containing a loop of sigmoid colon.  06/23/14: Head CT: Atrophy and microvascular ischemic disease without acute intracranial process.  Assessment & Plan 1. Essential hypertension Stable. Continue coreg 3.125mg  twice daily, lasix 40mg  daily and monitor his status  2. Acute on chronic systolic CHF (congestive heart failure) BNP 1072. Will  increase lasix from 40mg  daily to 40mg  twice daily x 5 days and recheck bmp. Continue coreg 3.125mg  twice daily and potassium twice daily. Continue daily weight and continue to monitor his status.   3. Pulmonary embolus: Probable INR 1.8 today. Continue coumadin at 2.5mg  daily. Recheck INR 08/21/14 and continue to monitor his status  4. COPD exacerbation In the setting of PNA. Continue oxygen therapy at 3lmp to maintain 02 sat >88%. Start duoneb every four hours x5 days then Q4h as needed for shob/wheezing. Continue dulera 100/36mcg 2 puffs twice daily and spiriva daily. Start mucinex 600mg  twice daily x 5 days then twice daily as needed for cough and congestion. Incentive spirometer every 2-4 hours daily while awake. Continue to monitor his status.    5. Sacral fracture, closed, initial encounter With nondisplaced right obturator ring fractures. Conservative management. Continue tylenol 325mg  every six hours as needed with oxycodone 5mg  IR every six hours as needed for pain. Continue to work with PT/OT for gait/strength/balance training to restore/maximize function. Fall risk precautions  6. Chronic kidney disease (CKD), stage III (moderate) Avoid nephrotoxic agents and monitor renal functions  7. Hyperlipidemia Continue lipitor 10mg  daily   8. S/P CABG (coronary artery bypass graft) Denies  any chest pain. Continue coreg 3.125mg  twice daily, statin, and SL nitro as needed for chest pain. Continue to monitor his status  9. Tobacco use disorder Continue nicoderm 21mg  patch daily. Encourage smoking cessation   10. Protein-calorie malnutrition, severe 9-lb weight loss over the past 30 days. Continue  Remeron, current diet, and dietary supplement.   11. Paroxysmal a-fib Stable. RRR on exam. Continue beta blocker. INR 1.8. Continue coumadin at 2.5mg  daily and recheck INR 08/21/14. Continue to monitor his status.   12. Decubitus ulcer of buttock, stage 2, right Is now unstageable but  measuring smaller: 2x0.7cm. Continue daily dressing change with NS, santyl, calcium alginate, and foam dressing. Continue pressure ulcer prophylaxis.   13. HCAP Continue to complete levoquin 500mg  daily x 10 days. See #4  14. Insomnia Stable. Continue remeron 7.5mg  daily at bedtime to help with sleep, depression, and appetite. Continue to monitor  15. Subtherapeutic INR INR 1.8 on 08/14/14. Continue coumadin 2.5mg  daily for now. Recheck INR 08/21/14  16. Fall at nursing home, subsequent encounter No recurrent fall since last fall. Continue fall risk precautions. Encourage patient to ask for help with transfer/ambulation.   17. Physical deconditioning Continue to work with PT/OT for gait/strenght/blance training to restore/maximize function. Continue fall and pressure ulcer precuations  18. Depression Continue wellbutrin 300mg  daily and remeron 7.5mg  daily. Continue to monitor for change in mood.   Diagnostic Studies/Labs Ordered: bmp in 1 week.    Family/Staff Communication Plan of care discussed with patient, wife, Lynden AngCathy,  and Education administratornursing staff. Patient, Lynden AngCathy, and nursing staff verbalized understanding and agree with plan of care. No additional questions or concerns reported.    Loura BackKim Jaidalyn Schillo, MSN, AGNP-C [facility] [address] [place], [place] [address] [phone] [8am-5pm] After hours: [phone]

## 2014-08-21 NOTE — Progress Notes (Signed)
Patient ID: Nathan [name], male   DOB: 23-Jul-1940, 74 y.o.   MRN: [id number]   Place of Service: [facility]  No Known Allergies  Code Status: Full Code  Goals of Care: Longevity/STR  Chief Complaint  Patient presents with  . Acute Visit    subtherapeutic INR, fall    HPI  74 y.o. male with  multiple complex PMH including CHF, CAD s/p CABG, HTN, HLD, COPD with ongoing tobacco abuse, depression, severe protein-calorie malnutrition is being seen for management of warfarin therapy. Patient is on long-term warfarin anticoagulation for probable PE and paroxysmal afib. Goal INR is 2 to 3. Today INR is 1.5 and current warfarin dose is  daily. No missed dose in past week reported. No upcoming procedure. Per nursing, patient has a fall on 4/8 after trying to get up unassisted-no apparent injuries or LOC reported. VS stable. Seen in room today.  Denies any other concerns.   Review of Systems Constitutional: Negative for fever and chills. Cardiovascular: Negative for chest pain, palpitations, and leg swelling Respiratory: Negative cough, shortness of breath, and wheezing.  Gastrointestinal: Negative for nausea and vomiting. Negative for abdominal pain. Negative for bloody stool.   Musculoskeletal: Negative for back pain. Positive for generalized weakness GU: Negative for hematuria  Neurological: Negative for dizziness and headache Skin: Negative for rash  Psychiatric: Negative for depression  Past Medical History  Diagnosis Date  . Aorto-iliac disease     a. s/p bilat with stent 2007 - followed by [facility].  . CAD (coronary artery disease)     a. 10/2011 s/p CABG x4 (LIMA-LAD, SVG-Diag, SVG-OM1 and SVG-PDA)  b. 03/2012 NSTEMI s/p LHC SVG-RCA and LIMA-LAD but total occlusion of SVG-OM as well as SVG-diag. RX therapy recommended   . Hypertension   . ED (erectile dysfunction)   . COPD, severe   . Hyperlipidemia   . Stroke   . GERD (gastroesophageal reflux disease)   . Gout   .  Paroxysmal atrial flutter     ablation  . Chronic combined systolic and diastolic CHF (congestive heart failure)     a. Last echo 04/2014: EF 40-45%.  Marland Kitchen PAF (paroxysmal atrial fibrillation)   . CKD (chronic kidney disease), stage III   . Tobacco abuse   . History of noncompliance with medical treatment   . Carotid artery disease     a. RCEA 2013 - followed by [facility].  . Anemia   . Lower GI bleed     a. LGIB 10/2012: Colonoscopy performed June 21 showed blood in the colon but no definite source identified, diverticular versus AVM suspected. Aspirin stopped.  . Mitral regurgitation   . Hypertensive heart disease     Past Surgical History  Procedure Laterality Date  . Spinal fusion    . Hand surgery    . Neck fusion  1975  . Eye surgery    . Coronary artery bypass graft  10/31/2011    Procedure: CORONARY ARTERY BYPASS GRAFTING (CABG);  Surgeon: Delight Ovens, MD;  Location: [facility] OR;  Service: Open Heart Surgery;  Laterality: N/A;  times three using  Left Internal Mammary Artery and Right Greater Saphenous Vein Graft harvested endoscopically; TEE  . Endarterectomy  10/31/2011    Procedure: ENDARTERECTOMY CAROTID;  Surgeon: Nada Libman, MD;  Location: [facility] OR;  Service: Vascular;  Laterality: Right;  with patch angioplasty   . Carotid endarterectomy  10-31-2011    right  . Colonoscopy N/A 10/30/2012    Procedure:  COLONOSCOPY;  Surgeon: Shirley FriarVincent C. Schooler, MD;  [facility];  Service: Endoscopy;  Laterality: N/A;  . Left heart catheterization with coronary angiogram N/A 09/04/2011    Procedure: LEFT HEART CATHETERIZATION WITH CORONARY ANGIOGRAM;  Surgeon: Lesleigh NoeHenry W Smith III, MD;  [facility];  Service: Cardiovascular;  Laterality: N/A;  . Left heart cath N/A 04/03/2012    Procedure: LEFT HEART CATH;  Surgeon: Lennette Biharihomas A Kelly, MD;  [facility];  Service: Cardiovascular;  Laterality: N/A;    History   Social History  . Marital Status: Married    Spouse Name:  N/A  . Number of Children: N/A  . Years of Education: N/A   Occupational History  . retired    Social History Main Topics  . Smoking status: Current Some Day Smoker -- 1.00 packs/day for 64 years    Types: Cigarettes  . Smokeless tobacco: Never Used  . 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 Drug Use: No  . Sexual Activity: Not on file   Other Topics Concern  . Not on file   Social History Narrative    Family History  Problem Relation Age of Onset  . Cancer Mother   . Tuberculosis Mother   . Heart disease Father     He did NOT have it before age 76      Medication List       This list is accurate as of: 08/21/14  3:39 PM.  Always use your most recent med list.               acetaminophen 325 MG tablet  Commonly known as:  TYLENOL  Take 1 tablet (325 mg total) by mouth every 6 (six) hours.     albuterol (2.5 MG/3ML) 0.083% nebulizer solution  Commonly known as:  PROVENTIL  Take 2.5 mg by nebulization 3 (three) times daily. And Q6H PRN     atorvastatin 10 MG tablet  Commonly known as:  LIPITOR  Take 1 tablet (10 mg total) by mouth daily at 6 PM.     buPROPion 300 MG 24 hr tablet  Commonly known as:  WELLBUTRIN XL  Take 300 mg by mouth daily.     carvedilol 3.125 MG tablet  Commonly known as:  COREG  Take 1 tablet (3.125 mg total) by mouth 2 (two) times daily with a meal.     colchicine 0.6 MG tablet  Take 0.6 mg by mouth daily.     docusate sodium 100 MG capsule  Commonly known as:  COLACE  Take 1 capsule (100 mg total) by mouth 2 (two) times daily.     feeding supplement (ENSURE COMPLETE) Liqd  Take 237 mLs by mouth 2 (two) times daily between meals.     furosemide 40 MG tablet  Commonly known as:  LASIX  Take 1 tablet (40 mg total) by mouth daily.     guaiFENesin 600 MG 12 hr tablet  Commonly known as:  MUCINEX  Take 600 mg by  mouth 2 (two) times daily as needed.     mirtazapine 7.5 MG tablet  Commonly known as:  REMERON  Take 7.5 mg by mouth at bedtime.     mometasone-formoterol 100-5 MCG/ACT Aero  Commonly known as:  DULERA  Inhale 2 puffs into the lungs 2 (two) times  daily.     nicotine 21 mg/24hr patch  Commonly known as:  NICODERM CQ - dosed in mg/24 hours  Place 1 patch (21 mg total) onto the skin daily.     nitroGLYCERIN 0.4 MG SL tablet  Commonly known as:  NITROSTAT  Place 0.4 mg under the tongue every 5 (five) minutes as needed for chest pain.     oxyCODONE 5 MG immediate release tablet  Commonly known as:  Oxy IR/ROXICODONE  Take 1 tablet (5 mg total) by mouth every 6 (six) hours as needed for severe pain.     pantoprazole 40 MG tablet  Commonly known as:  PROTONIX  Take 1 tablet (40 mg total) by mouth daily.     polyethylene glycol packet  Commonly known as:  MIRALAX / GLYCOLAX  Take 17 g by mouth daily.     Potassium Chloride ER 20 MEQ Tbcr  Take 20 mEq by mouth 2 (two) times daily.     saccharomyces boulardii 250 MG capsule  Commonly known as:  FLORASTOR  Take 1 capsule (250 mg total) by mouth 2 (two) times daily.     sennosides 8.8 MG/5ML syrup  Commonly known as:  SENOKOT  Take 5 mLs by mouth 2 (two) times daily.     tiotropium 18 MCG inhalation capsule  Commonly known as:  SPIRIVA  Place 1 capsule (18 mcg total) into inhaler and inhale daily.     warfarin 1 MG tablet  Commonly known as:  COUMADIN  Take 3 mg by mouth daily.        Physical Exam  BP 113/67 mmHg  Pulse 95  Temp(Src) 97.8 F (36.6 C)  Resp 18  SpO2 97%  Constitutional: Very frail elderly male in no acute distress. Conversant and pleasant  HEENT: Normocephalic and atraumatic. PERRL. EOM intact. No scleral icterus. Oral mucosa moist. Posterior pharynx clear of any exudate or lesions.  Neck: No lymphadenopathy, masses, or thyromegaly. No JVD or carotid bruits. Cardiac: Normal S1, S2. RRR without  appreciable murmurs, rubs, or gallops. Distal pulses intact. 2+ pitting edema of BLE Lungs: No respiratory distress. Breath sounds diminished bilaterally w/o wheezes, crackles, rhonchi. Abdomen: Audible bowel sounds in all quadrants. Non-distended and nontender. Musculoskeletal: limited ROM throughout with generalized weakness.  Skin: Warm, very dry and flaky.  Neurological: Alert and oriented to self Psychiatric: appropriate mood and affect  Labs Reviewed CBC Latest Ref Rng 07/10/2014 07/09/2014 07/08/2014  WBC 4.0 - 10.5 K/uL 10.7(H) 10.1 7.4  Hemoglobin 13.0 - 17.0 g/dL 10.0(L) 10.3(L) 10.0(L)  Hematocrit 39.0 - 52.0 % 33.7(L) 33.3(L) 33.3(L)  Platelets 150 - 400 K/uL 307 268 220    CMP Latest Ref Rng 08/21/2014 08/14/2014 07/09/2014  Glucose 70 - 99 mg/dL - - 94  BUN 4 - 21 mg/dL 16(X) 09(U) 04(V)  Creatinine 0.6 - 1.3 mg/dL 4.0(J) 8.1(X) 9.14  Sodium 137 - 147 mmol/L 139 135(A) 134(L)  Potassium 3.4 - 5.3 mmol/L 4.2 4.2 4.2  Chloride 96 - 112 mmol/L - - 100  CO2 19 - 32 mmol/L - - 27  Calcium 8.4 - 10.5 mg/dL - - 7.7(L)  Total Protein 6.0 - 8.3 g/dL - - -  Total Bilirubin 0.3 - 1.2 mg/dL - - -  Alkaline Phos 39 - 117 U/L - - -  AST 0 - 37 U/L - - -  ALT 0 - 53 U/L - - -    Lab Results  Component Value Date   INR 1.5* 08/21/2014   INR  1.8* 08/14/2014   INR 1.5* 08/07/2014   Assessment & Plan 1. Pulmonary embolus: Probable 2. Paroxysmal a-fib 3. Subtherapeutic international normalized ratio (INR) INR 1.5 today. Give coumadin 10mg  x 1. Recheck INR on 08/22/14. Continue to monitor his status.   4. Fall at nursing home, initial encounter No apparent injuries. VS stable. Encourage patient to ask for assistance with transfer. Continue to work with PT/OT for gait/strength/blance training to restore/maximize function. Continue fall risk precautions.   Family/Staff Communication Plan of care discussed with patient and nursing staff. Patient and nursing staff verbalized  understanding and agree with plan of care. No additional questions or concerns reported.    Loura Back, MSN, AGNP-C [facility] [address] [place], [place] [address] [phone] [8am-5pm] After hours: [phone]

## 2014-08-23 ENCOUNTER — Encounter: Payer: Self-pay | Admitting: Registered Nurse

## 2014-08-23 ENCOUNTER — Non-Acute Institutional Stay: Payer: Medicare PPO | Admitting: Registered Nurse

## 2014-08-23 DIAGNOSIS — L89310 Pressure ulcer of right buttock, unstageable: Secondary | ICD-10-CM

## 2014-08-23 DIAGNOSIS — G4700 Insomnia, unspecified: Secondary | ICD-10-CM

## 2014-08-23 DIAGNOSIS — N183 Chronic kidney disease, stage 3 unspecified: Secondary | ICD-10-CM

## 2014-08-23 DIAGNOSIS — R5381 Other malaise: Secondary | ICD-10-CM | POA: Diagnosis not present

## 2014-08-23 DIAGNOSIS — S3210XD Unspecified fracture of sacrum, subsequent encounter for fracture with routine healing: Secondary | ICD-10-CM

## 2014-08-23 DIAGNOSIS — I2699 Other pulmonary embolism without acute cor pulmonale: Secondary | ICD-10-CM

## 2014-08-23 DIAGNOSIS — R600 Localized edema: Secondary | ICD-10-CM

## 2014-08-23 DIAGNOSIS — Z9181 History of falling: Secondary | ICD-10-CM | POA: Diagnosis not present

## 2014-08-23 DIAGNOSIS — E785 Hyperlipidemia, unspecified: Secondary | ICD-10-CM | POA: Diagnosis not present

## 2014-08-23 DIAGNOSIS — J449 Chronic obstructive pulmonary disease, unspecified: Secondary | ICD-10-CM

## 2014-08-23 DIAGNOSIS — I10 Essential (primary) hypertension: Secondary | ICD-10-CM | POA: Diagnosis not present

## 2014-08-23 DIAGNOSIS — Z951 Presence of aortocoronary bypass graft: Secondary | ICD-10-CM

## 2014-08-23 DIAGNOSIS — F17200 Nicotine dependence, unspecified, uncomplicated: Secondary | ICD-10-CM

## 2014-08-23 DIAGNOSIS — F329 Major depressive disorder, single episode, unspecified: Secondary | ICD-10-CM | POA: Diagnosis not present

## 2014-08-23 DIAGNOSIS — E43 Unspecified severe protein-calorie malnutrition: Secondary | ICD-10-CM

## 2014-08-23 DIAGNOSIS — Z720 Tobacco use: Secondary | ICD-10-CM | POA: Diagnosis not present

## 2014-08-23 DIAGNOSIS — I5042 Chronic combined systolic (congestive) and diastolic (congestive) heart failure: Secondary | ICD-10-CM

## 2014-08-23 DIAGNOSIS — I480 Paroxysmal atrial fibrillation: Secondary | ICD-10-CM

## 2014-08-23 DIAGNOSIS — F32A Depression, unspecified: Secondary | ICD-10-CM

## 2014-08-23 DIAGNOSIS — J189 Pneumonia, unspecified organism: Secondary | ICD-10-CM

## 2014-08-23 LAB — POCT INR: INR: 2 — AB (ref 0.9–1.1)

## 2014-08-23 NOTE — Progress Notes (Signed)
Patient ID: Nathan [name], male   DOB: 04-17-1941, 74 y.o.   MRN: [id number]   Place of Service: [facility]  No Known Allergies  Code Status: Full Code  Goals of Care: Longevity/STR  Chief Complaint  Patient presents with  . Discharge Note    HPI 74 y.o. male with multiple complex PMH including CHF, CAD s/p CABG, HTN, HLD, COPD with ongoing tobacco abuse, depression, severe protein-calorie malnutrition among others is being seen for a discharge visit. He was here for short term rehab post hospital admission from 06/23/14 to 07/10/14 for nondisplaced RIGHT obturator ring fractures and nondisplaced RIGHT sacral ala fracture after a fall, intermediate probable PE per VQ scan on 06/26/14, severe sepsis secondary to E. Coli UTI, acute on chronic CHF, colonic ileus, and acute on chronic respiratory failure. He will be discharged home with [facility] PT/OT/RN and DME (WC with cushion and elevation leg rest, 3-1, O2 concentrator and portable). Seen in room today. Reported feeling excited about his discharge. Pain is adequate controlled with current regimen. Also reported increased swelling of BLE. Denies any other concerns.   Review of Systems Constitutional: Negative for fever and chills HENT: Negative for ear pain and sore throat.  Eyes: Negative for eye pain, eye discharge, and visual disturbance  Cardiovascular: Negative for chest pain and palpitations. Positive for leg swelling  Respiratory: Positive for shortness of breath with exertion (ongoing). Negative orthopnea, PND Gastrointestinal: Negative for nausea and vomiting. Negative for abdominal pain, diarrhea and constipation.  Genitourinary: Negative for  dysuria Musculoskeletal: Negative for uncontrolled pain Neurological: Negative for dizziness and headache Skin: Negative for rash. Positive for sore "on bottom"  Psychiatric: Negative for depression.   Past Medical History  Diagnosis Date  . Aorto-iliac disease     a. s/p bilat  with stent 2007 - followed by [facility].  . CAD (coronary artery disease)     a. 10/2011 s/p CABG x4 (LIMA-LAD, SVG-Diag, SVG-OM1 and SVG-PDA)  b. 03/2012 NSTEMI s/p LHC SVG-RCA and LIMA-LAD but total occlusion of SVG-OM as well as SVG-diag. RX therapy recommended   . Hypertension   . ED (erectile dysfunction)   . COPD, severe   . Hyperlipidemia   . Stroke   . GERD (gastroesophageal reflux disease)   . Gout   . Paroxysmal atrial flutter     ablation  . Chronic combined systolic and diastolic CHF (congestive heart failure)     a. Last echo 04/2014: EF 40-45%.  Marland Kitchen PAF (paroxysmal atrial fibrillation)   . CKD (chronic kidney disease), stage III   . Tobacco abuse   . History of noncompliance with medical treatment   . Carotid artery disease     a. RCEA 2013 - followed by [facility].  . Anemia   . Lower GI bleed     a. LGIB 10/2012: Colonoscopy performed June 21 showed blood in the colon but no definite source identified, diverticular versus AVM suspected. Aspirin stopped.  . Mitral regurgitation   . Hypertensive heart disease     Past Surgical History  Procedure Laterality Date  . Spinal fusion    . Hand surgery    . Neck fusion  1975  . Eye surgery    . Coronary artery bypass graft  10/31/2011    Procedure: CORONARY ARTERY BYPASS GRAFTING (CABG);  Surgeon: Delight Ovens, MD;  Location: [facility] OR;  Service: Open Heart Surgery;  Laterality: N/A;  times three using  Left Internal Mammary Artery and Right Greater Saphenous Vein  Graft harvested endoscopically; TEE  . Endarterectomy  10/31/2011    Procedure: ENDARTERECTOMY CAROTID;  Surgeon: Nada Libman, MD;  Location: [facility] OR;  Service: Vascular;  Laterality: Right;  with patch angioplasty   . Carotid endarterectomy  10-31-2011    right  . Colonoscopy N/A 10/30/2012    Procedure: COLONOSCOPY;  Surgeon: Shirley Friar, MD;  Location: [facility] ENDOSCOPY;  Service: Endoscopy;  Laterality: N/A;  . Left heart catheterization with coronary angiogram N/A  09/04/2011    Procedure: LEFT HEART CATHETERIZATION WITH CORONARY ANGIOGRAM;  Surgeon: Lesleigh Noe, MD;  Location: [facility] CATH LAB;  Service: Cardiovascular;  Laterality: N/A;  . Left heart cath N/A 04/03/2012    Procedure: LEFT HEART CATH;  Surgeon: Lennette Bihari, MD;  Location: [facility] CATH LAB;  Service: Cardiovascular;  Laterality: N/A;    History  Substance Use Topics  . Smoking status: Current Some Day Smoker -- 1.00 packs/day for 64 years    Types: Cigarettes  . Smokeless tobacco: Never Used  . 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 Cancer Mother   . Tuberculosis Mother   . Heart disease Father     He did NOT have it before age 66      Medication List       This list is accurate as of: 08/23/14 10:21 PM.  Always use your most recent med list.               acetaminophen 325 MG tablet  Commonly known as:  TYLENOL  Take 1 tablet (325 mg total) by mouth every 6 (six) hours.     albuterol (2.5 MG/3ML) 0.083% nebulizer solution  Commonly known as:  PROVENTIL  Take 2.5 mg by nebulization 3 (three) times daily. And Q6H PRN     atorvastatin 10 MG tablet  Commonly known as:  LIPITOR  Take 1 tablet (10 mg total) by mouth daily at 6 PM.     budesonide-formoterol 160-4.5 MCG/ACT inhaler  Commonly known as:  SYMBICORT  Inhale 2 puffs into the lungs 2 (two) times daily.     buPROPion 300 MG 24 hr tablet  Commonly known as:  WELLBUTRIN XL  Take 300 mg by mouth daily.     carvedilol 3.125 MG tablet  Commonly known as:  COREG  Take 1 tablet (3.125 mg total) by mouth 2 (two) times daily with a meal.     colchicine 0.6 MG tablet  Take 0.6 mg by mouth daily.     docusate sodium 100 MG capsule  Commonly known as:  COLACE  Take 1 capsule (100 mg total) by mouth 2 (two) times daily.     feeding supplement  (ENSURE COMPLETE) Liqd  Take 237 mLs by mouth 2 (two) times daily between meals.     furosemide 40 MG tablet  Commonly known as:  LASIX  Take 1 tablet (40 mg total) by mouth daily.     guaiFENesin 600 MG 12 hr tablet  Commonly known as:  MUCINEX  Take 600 mg by mouth 2 (two) times daily as needed.     ipratropium-albuterol 0.5-2.5 (3) MG/3ML Soln  Commonly known as:  DUONEB  Take 3 mLs by nebulization every 4 (four) hours as needed.  mirtazapine 7.5 MG tablet  Commonly known as:  REMERON  Take 7.5 mg by mouth at bedtime.     nicotine 21 mg/24hr patch  Commonly known as:  NICODERM CQ - dosed in mg/24 hours  Place 1 patch (21 mg total) onto the skin daily.     nitroGLYCERIN 0.4 MG SL tablet  Commonly known as:  NITROSTAT  Place 0.4 mg under the tongue every 5 (five) minutes as needed for chest pain.     oxyCODONE 5 MG immediate release tablet  Commonly known as:  Oxy IR/ROXICODONE  Take 1 tablet (5 mg total) by mouth every 6 (six) hours as needed for severe pain.     pantoprazole 40 MG tablet  Commonly known as:  PROTONIX  Take 1 tablet (40 mg total) by mouth daily.     polyethylene glycol packet  Commonly known as:  MIRALAX / GLYCOLAX  Take 17 g by mouth daily.     Potassium Chloride ER 20 MEQ Tbcr  Take 20 mEq by mouth 2 (two) times daily.     saccharomyces boulardii 250 MG capsule  Commonly known as:  FLORASTOR  Take 1 capsule (250 mg total) by mouth 2 (two) times daily.     sennosides 8.8 MG/5ML syrup  Commonly known as:  SENOKOT  Take 5 mLs by mouth 2 (two) times daily.     tiotropium 18 MCG inhalation capsule  Commonly known as:  SPIRIVA  Place 1 capsule (18 mcg total) into inhaler and inhale daily.     warfarin 4 MG tablet  Commonly known as:  COUMADIN  Take 4 mg by mouth daily.        Physical Exam  BP 117/76 mmHg  Pulse 91  Temp(Src) 97.6 F (36.4 C)  Resp 18  Ht  (1.854 m)  Wt 162 lb (73.483 kg)  BMI 21.38 kg/m2  SpO2  97%  Constitutional: Very frail elderly male in no acute distress. Conversant and pleasant HEENT: Normocephalic and atraumatic. PERRL. EOM intact. No scleral icterus. Oral mucosa moist. Posterior pharynx clear of any exudate or lesions.  Neck: Supple and nontender. No lymphadenopathy, masses, or thyromegaly. No JVD or carotid bruits. Cardiac: Normal S1, S2. RRR without appreciable murmurs, rubs, or gallops. Distal pulses diminished. 2+ pitting edema of BLE Respiratory: Unlabored respiration. Breath sounds diminished at bases. No rales, rhonchi, or wheezes. GI: Audible bowel sounds in all quadrants. Soft, nondistended, nontender Musculoskeletal: limited ROM throughout with generalized weakness.  Skin: Warm and dry/flaky. Stage 2 PU of right buttock is now unstageable and measuring smaller: 1.6x0.5x0cm, 100% covered with yellow slough; periwound erythematous, but blanchable.  Neurological: Alert and oriented to self.  Psychiatric: Appropriate mood and affect.   Labs Reviewed  CBC Latest Ref Rng 07/21/2014 07/10/2014 07/09/2014  WBC - 8.8 10.7(H) 10.1  Hemoglobin 13.5 - 17.5 g/dL 10.7(A) 10.0(L) 10.3(L)  Hematocrit 41 - 53 % 34(A) 33.7(L) 33.3(L)  Platelets 150 - 399 K/L 193 307 268    CMP Latest Ref Rng 08/21/2014 08/14/2014 07/09/2014  Glucose 70 - 99 mg/dL - - 94  BUN 4 - 21 mg/dL 16(X) 09(U) 04(V)  Creatinine 0.6 - 1.3 mg/dL 4.0(J) 8.1(X) 9.14  Sodium 137 - 147 mmol/L 139 135(A) 134(L)  Potassium 3.4 - 5.3 mmol/L 4.2 4.2 4.2  Chloride 96 - 112 mmol/L - - 100  CO2 19 - 32 mmol/L - - 27  Calcium 8.4 - 10.5 mg/dL - - 7.7(L)  Total Protein 6.0 - 8.3 g/dL - - -  Total Bilirubin 0.3 - 1.2 mg/dL - - -  Alkaline Phos 39 - 117 U/L - - -  AST 0 - 37 U/L - - -  ALT 0 - 53 U/L - - -    Lab Results  Component Value Date   HGBA1C 5.4 04/03/2012    Lab Results  Component Value Date   TSH 0.478 04/21/2014    Lipid Panel     Component Value Date/Time   CHOL 140 04/04/2012 0555   TRIG  183* 04/04/2012 0555   HDL 25* 04/04/2012 0555   CHOLHDL 5.6 04/04/2012 0555   VLDL 37 04/04/2012 0555   LDLCALC 78 04/04/2012 0555   Lab Results  Component Value Date   INR 2.0* 08/23/2014   INR 1.5* 08/21/2014   INR 1.8* 08/14/2014   Diagnostic Studies Reviewed 08/13/14: CXR: right lower lobe pneumonia  07/10/14: CXR: Mild interval improvement in pulmonary edema, with persistent small to moderate right and small left pleural effusions.  07/04/14: KUB: similar diffuse gaseous distention of the colon compatible with an ileus. No interval change in the degree of distention. No free air evident on the decubitus view. Vascular stents noted in iliac vessels. No acute osseous finding or abnormal calcification  06/26/14: VQ Scan: Intermediate probability for pulmonary embolism by PIOPED II. Bibasilar airspace disease and pleural effusions significantly limits the utility of V/Q scan.  06/26/14 2D-Echo -Left ventricle: LVEF is approximately 40% with diffuse hypokinesis, worse in the inferior and posterior   wall. Wall thickness was increased in a pattern of mild LVH. - Mitral valve: There was moderate regurgitation. - Left atrium: The atrium was moderately dilated. - Right ventricle: Systolic function was mildly reduced. - Tricuspid valve: There was moderate regurgitation. - Pulmonary arteries: PA peak pressure: 34 mm Hg (S).  06/23/14: MRI Right hip:  1. Nondisplaced RIGHT obturator ring fractures. Complementary nondisplaced RIGHT sacral ala fracture. 2. Bilateral hip girdle muscular strain. 3. Anasarca up. 4. LEFT inguinal hernia containing a loop of sigmoid colon.  06/23/14: Head CT: Atrophy and microvascular ischemic disease without acute intracranial process.  Assessment & Plan 1. Essential hypertension Stable. Continue coreg 3.125mg  twice daily and lasix  daily after discharge on 08/25/14. Continue to f/u with PCP  2. Chronic combined systolic and diastolic CHF (congestive  heart failure) EF 40% on 2D Echo 06/26/14. BNP 774.6 on 08/21/14 with 2+ pitting edema of BLE. Continue coreg 3.125mg  twice daily. Will increase lasix  twice daily x 3 days then resume  daily. Continue potassium twice daily. Continue daily weight. Will be discharge home with [facility] RN for disease/medication management as he has been refusing daily weight and his medications on 08/24/14. I've spoken with the patient and stress importance of medication adherence and daily weight to prevent rehospitalization. Advise patient to notify PCP or cardilogy for worsening of leg swelling, 3lb or more weight gain in 24 hour period, worsening of shortness of breath, orthopnea, or PND. Continue to f/u with PCP and cardiology.   3. Pulmonary embolus: Probable INR 2.0 today. Continue coumadin at  daily. Will be discharged home with RN for INR monitoring. Next INR 08/28/14.   4. Chronic obstructive pulmonary disease, unspecified COPD, unspecified chronic bronchitis type Stable. Continue continuous oxygen at 3lpm via [place] to maintain O2 sat >88 %. Continue symbicort 2 puffs twice daily, spiriva daily, and albuterol 2.5mg /16mL three times daily with duoneb Q4H as needed for shob and wheezing. Continue to f/u with PCP  5. Sacral fracture, closed, subsequent  encounter With nondisplaced right obturator ring fractures. Conservative management. Pain is adequately controlled with current regimen. Continue tylenol 325mg  every six hours as needed with oxycodone 5mg  IR every six hours as needed for pain. [facility]/strength/balance training to restore/maximize function. Fall risk precautions  6. Chronic kidney disease (CKD), stage III (moderate) Avoid nephrotoxic agents, especially NSAIDs. PCP to monitor renal function  7. Hyperlipidemia Continue lipitor 10mg  daily   8. S/P CABG (coronary artery bypass graft) Denies any chest pain. Continue coreg 3.125mg  twice daily, statin, and SL  nitroglycerin as needed for chest pain.   9. Tobacco use disorder Continue nicoderm 21mg /24hr patch daily. Encourage smoking cessation   10. Protein-calorie malnutrition, severe Continue current diet and dietary supplement. Encourage PO intake.   11. Paroxysmal a-fib Stable. RRR on exam. Continue carvedilol 3.125mg  twice daily with coumadin 4mg  daily- INR therapeutic today.   12. Decubitus ulcer of buttock, unstageable, right Continue wound care with santyl, calcium alginate, and foam dressing daily and as needed. Encourage frequent change of position. Pressure ulcer precaution. [facility].   13. HCAP Complete 10 days course of Levoquin. Denies increased shortness of breath. Respiratory exam unremarkable  14. Insomnia Stable. Continue remeron 7.5mg  daily at bedtime.   15.  Depression Mood stable. Continue wellbutrin 300mg  daily   16. Hx of fall 2 falls since admission to facility. High fall risk-continue fall risk precaution  17. Physical deconditioning [facility]/strength/balance training to restore/maximize function. Fall risk precautions   18. Bilateral leg edema Most likely from volume overload. Lasix increased to 40mg  twice daily x 3 days then 40mg  daily. Unable to to tolerate compression stocking. Recommended Ace wrap as tolerated to BLE while awake and keep ble elevated as much as possible, especially while in bed.   Home health services: PT/OT/RN DME required: Standard wheelchair with cushion and elevating leg rest, 3-1, O2 concentrator and portable PCP follow-up: Please schedule f/u appt with PCP w/in 1-2 weeks of discharge from skilled nursing facility  30-day supply of prescription medications provided. (#30 oxycodone 5mg  IR)   Family/Staff Communication Plan of care discussed with patient and nursing staff. Patient and nursing staff verbalized understanding and agree with plan of care. No  additional questions or concerns reported.    Loura BackKim Aaryanna Hyden, MSN, AGNP-C [facility] [address] [place], [place] [address] [phone] [8am-5pm] After hours: [phone]

## 2014-09-18 ENCOUNTER — Inpatient Hospital Stay (HOSPITAL_COMMUNITY)
Admission: EM | Admit: 2014-09-18 | Discharge: 2014-10-11 | DRG: 870 | Disposition: E | Payer: Medicare PPO | Attending: Internal Medicine | Admitting: Internal Medicine

## 2014-09-18 ENCOUNTER — Encounter (HOSPITAL_COMMUNITY): Payer: Self-pay | Admitting: Emergency Medicine

## 2014-09-18 ENCOUNTER — Emergency Department (HOSPITAL_COMMUNITY): Payer: Medicare PPO

## 2014-09-18 DIAGNOSIS — J9601 Acute respiratory failure with hypoxia: Secondary | ICD-10-CM

## 2014-09-18 DIAGNOSIS — J189 Pneumonia, unspecified organism: Secondary | ICD-10-CM | POA: Diagnosis not present

## 2014-09-18 DIAGNOSIS — E785 Hyperlipidemia, unspecified: Secondary | ICD-10-CM

## 2014-09-18 DIAGNOSIS — I472 Ventricular tachycardia: Secondary | ICD-10-CM | POA: Diagnosis not present

## 2014-09-18 DIAGNOSIS — Z452 Encounter for adjustment and management of vascular access device: Secondary | ICD-10-CM

## 2014-09-18 DIAGNOSIS — Z4659 Encounter for fitting and adjustment of other gastrointestinal appliance and device: Secondary | ICD-10-CM

## 2014-09-18 DIAGNOSIS — I340 Nonrheumatic mitral (valve) insufficiency: Secondary | ICD-10-CM | POA: Insufficient documentation

## 2014-09-18 DIAGNOSIS — R188 Other ascites: Secondary | ICD-10-CM | POA: Diagnosis present

## 2014-09-18 DIAGNOSIS — K5900 Constipation, unspecified: Secondary | ICD-10-CM | POA: Diagnosis not present

## 2014-09-18 DIAGNOSIS — Z978 Presence of other specified devices: Secondary | ICD-10-CM

## 2014-09-18 DIAGNOSIS — J449 Chronic obstructive pulmonary disease, unspecified: Secondary | ICD-10-CM | POA: Diagnosis present

## 2014-09-18 DIAGNOSIS — F17210 Nicotine dependence, cigarettes, uncomplicated: Secondary | ICD-10-CM | POA: Diagnosis present

## 2014-09-18 DIAGNOSIS — N529 Male erectile dysfunction, unspecified: Secondary | ICD-10-CM | POA: Diagnosis present

## 2014-09-18 DIAGNOSIS — Z809 Family history of malignant neoplasm, unspecified: Secondary | ICD-10-CM

## 2014-09-18 DIAGNOSIS — I4892 Unspecified atrial flutter: Secondary | ICD-10-CM | POA: Diagnosis present

## 2014-09-18 DIAGNOSIS — R1310 Dysphagia, unspecified: Secondary | ICD-10-CM | POA: Diagnosis not present

## 2014-09-18 DIAGNOSIS — T502X5A Adverse effect of carbonic-anhydrase inhibitors, benzothiadiazides and other diuretics, initial encounter: Secondary | ICD-10-CM | POA: Diagnosis not present

## 2014-09-18 DIAGNOSIS — J90 Pleural effusion, not elsewhere classified: Secondary | ICD-10-CM

## 2014-09-18 DIAGNOSIS — Z66 Do not resuscitate: Secondary | ICD-10-CM | POA: Diagnosis present

## 2014-09-18 DIAGNOSIS — T17908A Unspecified foreign body in respiratory tract, part unspecified causing other injury, initial encounter: Secondary | ICD-10-CM

## 2014-09-18 DIAGNOSIS — E876 Hypokalemia: Secondary | ICD-10-CM | POA: Diagnosis not present

## 2014-09-18 DIAGNOSIS — I130 Hypertensive heart and chronic kidney disease with heart failure and stage 1 through stage 4 chronic kidney disease, or unspecified chronic kidney disease: Secondary | ICD-10-CM | POA: Diagnosis present

## 2014-09-18 DIAGNOSIS — N170 Acute kidney failure with tubular necrosis: Secondary | ICD-10-CM

## 2014-09-18 DIAGNOSIS — R6521 Severe sepsis with septic shock: Secondary | ICD-10-CM | POA: Diagnosis not present

## 2014-09-18 DIAGNOSIS — Z79899 Other long term (current) drug therapy: Secondary | ICD-10-CM

## 2014-09-18 DIAGNOSIS — J9690 Respiratory failure, unspecified, unspecified whether with hypoxia or hypercapnia: Secondary | ICD-10-CM

## 2014-09-18 DIAGNOSIS — R1314 Dysphagia, pharyngoesophageal phase: Secondary | ICD-10-CM | POA: Insufficient documentation

## 2014-09-18 DIAGNOSIS — R21 Rash and other nonspecific skin eruption: Secondary | ICD-10-CM | POA: Diagnosis not present

## 2014-09-18 DIAGNOSIS — E870 Hyperosmolality and hypernatremia: Secondary | ICD-10-CM | POA: Diagnosis not present

## 2014-09-18 DIAGNOSIS — Z515 Encounter for palliative care: Secondary | ICD-10-CM

## 2014-09-18 DIAGNOSIS — J9811 Atelectasis: Secondary | ICD-10-CM | POA: Diagnosis not present

## 2014-09-18 DIAGNOSIS — I5021 Acute systolic (congestive) heart failure: Secondary | ICD-10-CM

## 2014-09-18 DIAGNOSIS — J95811 Postprocedural pneumothorax: Secondary | ICD-10-CM | POA: Diagnosis not present

## 2014-09-18 DIAGNOSIS — I5023 Acute on chronic systolic (congestive) heart failure: Secondary | ICD-10-CM

## 2014-09-18 DIAGNOSIS — Y838 Other surgical procedures as the cause of abnormal reaction of the patient, or of later complication, without mention of misadventure at the time of the procedure: Secondary | ICD-10-CM | POA: Diagnosis not present

## 2014-09-18 DIAGNOSIS — N183 Chronic kidney disease, stage 3 (moderate): Secondary | ICD-10-CM | POA: Diagnosis present

## 2014-09-18 DIAGNOSIS — A419 Sepsis, unspecified organism: Principal | ICD-10-CM

## 2014-09-18 DIAGNOSIS — R64 Cachexia: Secondary | ICD-10-CM | POA: Diagnosis present

## 2014-09-18 DIAGNOSIS — I493 Ventricular premature depolarization: Secondary | ICD-10-CM | POA: Diagnosis not present

## 2014-09-18 DIAGNOSIS — IMO0001 Reserved for inherently not codable concepts without codable children: Secondary | ICD-10-CM

## 2014-09-18 DIAGNOSIS — N280 Ischemia and infarction of kidney: Secondary | ICD-10-CM | POA: Diagnosis present

## 2014-09-18 DIAGNOSIS — I252 Old myocardial infarction: Secondary | ICD-10-CM

## 2014-09-18 DIAGNOSIS — F329 Major depressive disorder, single episode, unspecified: Secondary | ICD-10-CM | POA: Diagnosis present

## 2014-09-18 DIAGNOSIS — Z951 Presence of aortocoronary bypass graft: Secondary | ICD-10-CM

## 2014-09-18 DIAGNOSIS — E440 Moderate protein-calorie malnutrition: Secondary | ICD-10-CM | POA: Insufficient documentation

## 2014-09-18 DIAGNOSIS — Z6824 Body mass index (BMI) 24.0-24.9, adult: Secondary | ICD-10-CM

## 2014-09-18 DIAGNOSIS — K219 Gastro-esophageal reflux disease without esophagitis: Secondary | ICD-10-CM

## 2014-09-18 DIAGNOSIS — Z789 Other specified health status: Secondary | ICD-10-CM

## 2014-09-18 DIAGNOSIS — N189 Chronic kidney disease, unspecified: Secondary | ICD-10-CM

## 2014-09-18 DIAGNOSIS — R109 Unspecified abdominal pain: Secondary | ICD-10-CM

## 2014-09-18 DIAGNOSIS — N179 Acute kidney failure, unspecified: Secondary | ICD-10-CM

## 2014-09-18 DIAGNOSIS — Z981 Arthrodesis status: Secondary | ICD-10-CM

## 2014-09-18 DIAGNOSIS — D649 Anemia, unspecified: Secondary | ICD-10-CM | POA: Diagnosis present

## 2014-09-18 DIAGNOSIS — K7200 Acute and subacute hepatic failure without coma: Secondary | ICD-10-CM | POA: Diagnosis not present

## 2014-09-18 DIAGNOSIS — I2510 Atherosclerotic heart disease of native coronary artery without angina pectoris: Secondary | ICD-10-CM | POA: Diagnosis present

## 2014-09-18 DIAGNOSIS — J939 Pneumothorax, unspecified: Secondary | ICD-10-CM

## 2014-09-18 DIAGNOSIS — K529 Noninfective gastroenteritis and colitis, unspecified: Secondary | ICD-10-CM

## 2014-09-18 DIAGNOSIS — Z86711 Personal history of pulmonary embolism: Secondary | ICD-10-CM

## 2014-09-18 DIAGNOSIS — F0390 Unspecified dementia without behavioral disturbance: Secondary | ICD-10-CM | POA: Diagnosis present

## 2014-09-18 DIAGNOSIS — Z9889 Other specified postprocedural states: Secondary | ICD-10-CM

## 2014-09-18 DIAGNOSIS — Z8673 Personal history of transient ischemic attack (TIA), and cerebral infarction without residual deficits: Secondary | ICD-10-CM

## 2014-09-18 DIAGNOSIS — R579 Shock, unspecified: Secondary | ICD-10-CM | POA: Insufficient documentation

## 2014-09-18 DIAGNOSIS — Z79891 Long term (current) use of opiate analgesic: Secondary | ICD-10-CM

## 2014-09-18 DIAGNOSIS — Z7901 Long term (current) use of anticoagulants: Secondary | ICD-10-CM

## 2014-09-18 DIAGNOSIS — E162 Hypoglycemia, unspecified: Secondary | ICD-10-CM | POA: Diagnosis not present

## 2014-09-18 DIAGNOSIS — I5043 Acute on chronic combined systolic (congestive) and diastolic (congestive) heart failure: Secondary | ICD-10-CM | POA: Diagnosis present

## 2014-09-18 DIAGNOSIS — D696 Thrombocytopenia, unspecified: Secondary | ICD-10-CM | POA: Diagnosis present

## 2014-09-18 DIAGNOSIS — Z01818 Encounter for other preprocedural examination: Secondary | ICD-10-CM

## 2014-09-18 DIAGNOSIS — I272 Other secondary pulmonary hypertension: Secondary | ICD-10-CM | POA: Diagnosis present

## 2014-09-18 DIAGNOSIS — Z9119 Patient's noncompliance with other medical treatment and regimen: Secondary | ICD-10-CM | POA: Diagnosis present

## 2014-09-18 DIAGNOSIS — L509 Urticaria, unspecified: Secondary | ICD-10-CM | POA: Diagnosis not present

## 2014-09-18 DIAGNOSIS — E872 Acidosis: Secondary | ICD-10-CM | POA: Diagnosis not present

## 2014-09-18 DIAGNOSIS — Z8249 Family history of ischemic heart disease and other diseases of the circulatory system: Secondary | ICD-10-CM

## 2014-09-18 DIAGNOSIS — E875 Hyperkalemia: Secondary | ICD-10-CM | POA: Diagnosis not present

## 2014-09-18 DIAGNOSIS — Y95 Nosocomial condition: Secondary | ICD-10-CM | POA: Diagnosis present

## 2014-09-18 DIAGNOSIS — G9341 Metabolic encephalopathy: Secondary | ICD-10-CM | POA: Diagnosis not present

## 2014-09-18 DIAGNOSIS — I739 Peripheral vascular disease, unspecified: Secondary | ICD-10-CM | POA: Diagnosis present

## 2014-09-18 DIAGNOSIS — T45515A Adverse effect of anticoagulants, initial encounter: Secondary | ICD-10-CM | POA: Diagnosis present

## 2014-09-18 DIAGNOSIS — I480 Paroxysmal atrial fibrillation: Secondary | ICD-10-CM | POA: Insufficient documentation

## 2014-09-18 DIAGNOSIS — M109 Gout, unspecified: Secondary | ICD-10-CM | POA: Diagnosis present

## 2014-09-18 HISTORY — DX: Other pulmonary embolism without acute cor pulmonale: I26.99

## 2014-09-18 LAB — CBC WITH DIFFERENTIAL/PLATELET
Basophils Absolute: 0 10*3/uL (ref 0.0–0.1)
Basophils Relative: 0 % (ref 0–1)
EOS ABS: 0 10*3/uL (ref 0.0–0.7)
Eosinophils Relative: 0 % (ref 0–5)
HCT: 36.3 % — ABNORMAL LOW (ref 39.0–52.0)
HEMOGLOBIN: 10.5 g/dL — AB (ref 13.0–17.0)
LYMPHS ABS: 1.2 10*3/uL (ref 0.7–4.0)
Lymphocytes Relative: 10 % — ABNORMAL LOW (ref 12–46)
MCH: 24.4 pg — AB (ref 26.0–34.0)
MCHC: 28.9 g/dL — ABNORMAL LOW (ref 30.0–36.0)
MCV: 84.4 fL (ref 78.0–100.0)
MONO ABS: 0.9 10*3/uL (ref 0.1–1.0)
Monocytes Relative: 8 % (ref 3–12)
NEUTROS PCT: 82 % — AB (ref 43–77)
Neutro Abs: 9.5 10*3/uL — ABNORMAL HIGH (ref 1.7–7.7)
Platelets: 244 10*3/uL (ref 150–400)
RBC: 4.3 MIL/uL (ref 4.22–5.81)
RDW: 19.8 % — ABNORMAL HIGH (ref 11.5–15.5)
WBC: 11.7 10*3/uL — ABNORMAL HIGH (ref 4.0–10.5)

## 2014-09-18 LAB — COMPREHENSIVE METABOLIC PANEL
ALBUMIN: 2.9 g/dL — AB (ref 3.5–5.0)
ALK PHOS: 143 U/L — AB (ref 38–126)
ALT: 153 U/L — ABNORMAL HIGH (ref 17–63)
AST: 143 U/L — AB (ref 15–41)
Anion gap: 15 (ref 5–15)
BUN: 59 mg/dL — AB (ref 6–20)
CO2: 23 mmol/L (ref 22–32)
Calcium: 8.8 mg/dL — ABNORMAL LOW (ref 8.9–10.3)
Chloride: 95 mmol/L — ABNORMAL LOW (ref 101–111)
Creatinine, Ser: 2.9 mg/dL — ABNORMAL HIGH (ref 0.61–1.24)
GFR calc Af Amer: 23 mL/min — ABNORMAL LOW (ref >60)
GFR calc non Af Amer: 20 mL/min — ABNORMAL LOW (ref >60)
Glucose, Bld: 89 mg/dL (ref 70–99)
Potassium: 4.9 mmol/L (ref 3.5–5.1)
Sodium: 133 mmol/L — ABNORMAL LOW (ref 135–145)
TOTAL PROTEIN: 7.4 g/dL (ref 6.5–8.1)
Total Bilirubin: 2 mg/dL — ABNORMAL HIGH (ref 0.3–1.2)

## 2014-09-18 LAB — I-STAT CG4 LACTIC ACID, ED: LACTIC ACID, VENOUS: 4.88 mmol/L — AB (ref 0.5–2.0)

## 2014-09-18 LAB — PROTIME-INR
INR: 9 — AB (ref 0.00–1.49)
PROTHROMBIN TIME: 74 s — AB (ref 11.6–15.2)

## 2014-09-18 LAB — BRAIN NATRIURETIC PEPTIDE: B Natriuretic Peptide: 1350.8 pg/mL — ABNORMAL HIGH (ref 0.0–100.0)

## 2014-09-18 MED ORDER — SODIUM CHLORIDE 0.9 % IV SOLN
INTRAVENOUS | Status: DC
  Administered 2014-09-19: via INTRAVENOUS

## 2014-09-18 MED ORDER — SODIUM CHLORIDE 0.9 % IV BOLUS (SEPSIS)
1000.0000 mL | Freq: Once | INTRAVENOUS | Status: AC
  Administered 2014-09-18: 1000 mL via INTRAVENOUS

## 2014-09-18 MED ORDER — VANCOMYCIN HCL IN DEXTROSE 1-5 GM/200ML-% IV SOLN
1000.0000 mg | Freq: Once | INTRAVENOUS | Status: AC
  Administered 2014-09-19: 1000 mg via INTRAVENOUS
  Filled 2014-09-18: qty 200

## 2014-09-18 MED ORDER — ONDANSETRON HCL 4 MG/2ML IJ SOLN
4.0000 mg | Freq: Once | INTRAMUSCULAR | Status: AC
  Administered 2014-09-18: 4 mg via INTRAVENOUS
  Filled 2014-09-18: qty 2

## 2014-09-18 MED ORDER — DEXTROSE 5 % IV SOLN
2.0000 g | Freq: Once | INTRAVENOUS | Status: AC
  Administered 2014-09-19: 2 g via INTRAVENOUS
  Filled 2014-09-18: qty 2

## 2014-09-18 NOTE — ED Notes (Signed)
MD at bedside. 

## 2014-09-18 NOTE — ED Notes (Signed)
Patient transported to CT 

## 2014-09-18 NOTE — ED Provider Notes (Signed)
CSN: [id number]     Arrival date & time 10-05-14  2116 History   First MD Initiated Contact with Patient Oct 05, 2014 2155     Chief Complaint  Patient presents with  . Constipation  . Leg Swelling     (Consider location/radiation/quality/duration/timing/severity/associated sxs/prior Treatment) HPI Comments: Patient here complaining of 3 days of constipation with associated emesis described as dark. No blood in his stools. No fever or chills. Also notes crampy abdominal pain described as diffuse as well. Has not been able to keep down his medications. Symptoms of been progressively worse. He now feels profoundly weak and is worse with standing. Called his doctor who told him to come here for further evaluation. Symptoms persistent and nothing makes them better  Patient is a 74 y.o. male presenting with constipation. The history is provided by the patient and the spouse.  Constipation   Past Medical History  Diagnosis Date  . Aorto-iliac disease     a. s/p bilat with stent 2007 - followed by [facility].  . CAD (coronary artery disease)     a. 10/2011 s/p CABG x4 (LIMA-LAD, SVG-Diag, SVG-OM1 and SVG-PDA)  b. 03/2012 NSTEMI s/p LHC SVG-RCA and LIMA-LAD but total occlusion of SVG-OM as well as SVG-diag. RX therapy recommended   . Hypertension   . ED (erectile dysfunction)   . COPD, severe   . Hyperlipidemia   . Stroke   . GERD (gastroesophageal reflux disease)   . Gout   . Paroxysmal atrial flutter     ablation  . Chronic combined systolic and diastolic CHF (congestive heart failure)     a. Last echo 04/2014: EF 40-45%.  Marland Kitchen PAF (paroxysmal atrial fibrillation)   . CKD (chronic kidney disease), stage III   . Tobacco abuse   . History of noncompliance with medical treatment   . Carotid artery disease     a. RCEA 2013 - followed by [facility].  . Anemia   . Lower GI bleed     a. LGIB 10/2012: Colonoscopy performed June 21 showed blood in the colon but no definite source identified, diverticular versus  AVM suspected. Aspirin stopped.  . Mitral regurgitation   . Hypertensive heart disease   . PE (pulmonary embolism)    Past Surgical History  Procedure Laterality Date  . Spinal fusion    . Hand surgery    . Neck fusion  1975  . Eye surgery    . Coronary artery bypass graft  10/31/2011    Procedure: CORONARY ARTERY BYPASS GRAFTING (CABG);  Surgeon: Delight Ovens, MD;  Location: [facility] OR;  Service: Open Heart Surgery;  Laterality: N/A;  times three using  Left Internal Mammary Artery and Right Greater Saphenous Vein Graft harvested endoscopically; TEE  . Endarterectomy  10/31/2011    Procedure: ENDARTERECTOMY CAROTID;  Surgeon: Nada Libman, MD;  Location: [facility] OR;  Service: Vascular;  Laterality: Right;  with patch angioplasty   . Carotid endarterectomy  10-31-2011    right  . Colonoscopy N/A 10/30/2012    Procedure: COLONOSCOPY;  Surgeon: Shirley Friar, MD;  Location: [facility] ENDOSCOPY;  Service: Endoscopy;  Laterality: N/A;  . Left heart catheterization with coronary angiogram N/A 09/04/2011    Procedure: LEFT HEART CATHETERIZATION WITH CORONARY ANGIOGRAM;  Surgeon: Lesleigh Noe, MD;  Location[facility] CATH LAB;  Service: Cardiovascular;  Laterality: N/A;  . Left heart cath N/A 04/03/2012    Procedure: LEFT HEART CATH;  Surgeon: Lennette Bihari, MD;  Location: [facility] CATH LAB;  Service: Cardiovascular;  Laterality: N/A;  . Pelvic fracture surgery     Family History  Problem Relation Age of Onset  . Cancer Mother   . Tuberculosis Mother   . Heart disease Father     He did NOT have it before age 74   History  Substance Use Topics  . Smoking status: Current Some Day Smoker -- 1.00 packs/day for 64 years    Types: Cigarettes  . Smokeless tobacco: Never Used  . 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  All  other systems reviewed and are negative.     Allergies  Review of patient's allergies indicates no known allergies.  Home Medications   Prior to Admission medications   Medication Sig Start Date End Date Taking? Authorizing Provider  albuterol (PROVENTIL) (2.5 MG/3ML) 0.083% nebulizer solution Take 2.5 mg by nebulization 3 (three) times daily. And Q6H PRN   Yes Historical Provider, MD  atorvastatin (LIPITOR) 10 MG tablet Take 1 tablet (10 mg total) by mouth daily at 6 PM. 01/05/14  Yes Jeralyn BennettEzequiel Zamora, MD  bisacodyl (DULCOLAX) 5 MG EC tablet Take 5 mg by mouth daily as needed for mild constipation or moderate constipation.   Yes Historical Provider, MD  budesonide-formoterol (SYMBICORT) 160-4.5 MCG/ACT inhaler Inhale 2 puffs into the lungs 2 (two) times daily.   Yes Historical Provider, MD  buPROPion (WELLBUTRIN XL) 300 MG 24 hr tablet Take 300 mg by mouth daily.   Yes Historical Provider, MD  carvedilol (COREG) 3.125 MG tablet Take 1 tablet (3.125 mg total) by mouth 2 (two) times daily with a meal. 07/10/14  Yes Osvaldo ShipperGokul Krishnan, MD  colchicine 0.6 MG tablet Take 0.6 mg by mouth daily.   Yes Historical Provider, MD  furosemide (LASIX) 40 MG tablet Take 1 tablet (40 mg total) by mouth daily. 06/20/14  Yes Lyn RecordsHenry W Smith, MD  mirtazapine (REMERON) 7.5 MG tablet Take 7.5 mg by mouth at bedtime.   Yes Historical Provider, MD  nicotine (NICODERM CQ - DOSED IN MG/24 HOURS) 21 mg/24hr patch Place 1 patch (21 mg total) onto the skin daily. 07/10/14  Yes Osvaldo ShipperGokul Krishnan, MD  oxyCODONE (OXY IR/ROXICODONE) 5 MG immediate release tablet Take 1 tablet (5 mg total) by mouth every 6 (six) hours as needed for severe pain. 08/03/14  Yes Sharon SellerJessica K Eubanks, NP  pantoprazole (PROTONIX) 40 MG tablet Take 1 tablet (40 mg total) by mouth daily. 07/10/14  Yes Osvaldo ShipperGokul Krishnan, MD  polyethylene glycol (MIRALAX / GLYCOLAX) packet Take 17 g by mouth daily. 07/10/14  Yes Osvaldo ShipperGokul Krishnan, MD  potassium chloride 20 MEQ TBCR Take 20  mEq by mouth 2 (two) times daily. 07/10/14  Yes Osvaldo ShipperGokul Krishnan, MD  warfarin (COUMADIN) 4 MG tablet Take 4 mg by mouth daily.   Yes Historical Provider, MD  acetaminophen (TYLENOL) 325 MG tablet Take 1 tablet (325 mg total) by mouth every 6 (six) hours. 07/10/14   Tiffany Neva SeatGreene, PA-C  docusate sodium (COLACE) 100 MG capsule Take 1 capsule (100 mg total) by mouth 2 (two) times daily. Patient not taking: Reported on 09/16/2014 07/10/14   Osvaldo ShipperGokul Krishnan, MD  feeding supplement, ENSURE COMPLETE, (ENSURE COMPLETE) LIQD Take 237 mLs by mouth 2 (two) times daily between meals. Patient not taking: Reported on 09/25/2014 07/10/14  Osvaldo Shipper, MD  guaiFENesin (MUCINEX) 600 MG 12 hr tablet Take 600 mg by mouth 2 (two) times daily as needed for cough.     Historical Provider, MD  ipratropium-albuterol (DUONEB) 0.5-2.5 (3) MG/3ML SOLN Take 3 mLs by nebulization every 4 (four) hours as needed. For shortness of breath    Historical Provider, MD  nitroGLYCERIN (NITROSTAT) 0.4 MG SL tablet Place 0.4 mg under the tongue every 5 (five) minutes as needed for chest pain.    Historical Provider, MD  saccharomyces boulardii (FLORASTOR) 250 MG capsule Take 1 capsule (250 mg total) by mouth 2 (two) times daily. Patient not taking: Reported on 09/21/2014 07/10/14   Osvaldo Shipper, MD  sennosides (SENOKOT) 8.8 MG/5ML syrup Take 5 mLs by mouth 2 (two) times daily. Patient not taking: Reported on 09-21-14 07/10/14   Osvaldo Shipper, MD  tiotropium (SPIRIVA) 18 MCG inhalation capsule Place 1 capsule (18 mcg total) into inhaler and inhale daily. Patient not taking: Reported on 09/21/14 06/18/14   Rhetta Mura, MD   BP 99/77 mmHg  Pulse 128  Temp(Src) 97.4 F (36.3 C) (Oral)  Resp 22  SpO2 96% Physical Exam  Constitutional: He is oriented to person, place, and time. He appears cachectic.  Non-toxic appearance. No distress.  HENT:  Head: Normocephalic and atraumatic.  Eyes: Conjunctivae, EOM and lids are normal. Pupils are  equal, round, and reactive to light.  Neck: Normal range of motion. Neck supple. No tracheal deviation present. No thyroid mass present.  Cardiovascular: Regular rhythm and normal heart sounds.  Tachycardia present.  Exam reveals no gallop.   No murmur heard. Pulmonary/Chest: Effort normal and breath sounds normal. No stridor. No respiratory distress. He has no decreased breath sounds. He has no wheezes. He has no rhonchi. He has no rales.  Abdominal: Soft. Normal appearance and bowel sounds are normal. He exhibits distension. There is no tenderness. There is no rigidity, no rebound, no guarding and no CVA tenderness.  Musculoskeletal: Normal range of motion. He exhibits no edema or tenderness.  Neurological: He is alert and oriented to person, place, and time. He has normal strength. No cranial nerve deficit or sensory deficit. GCS eye subscore is 4. GCS verbal subscore is 5. GCS motor subscore is 6.  Skin: Skin is warm and dry. No abrasion and no rash noted.  Psychiatric: He has a normal mood and affect. His speech is normal and behavior is normal.  Nursing note and vitals reviewed.   ED Course  Procedures (including critical care time) Labs Review Labs Reviewed  CBC WITH DIFFERENTIAL/PLATELET - Abnormal; Notable for the following:    WBC 11.7 (*)    Hemoglobin 10.5 (*)    HCT 36.3 (*)    MCH 24.4 (*)    MCHC 28.9 (*)    RDW 19.8 (*)    Neutrophils Relative % 82 (*)    Neutro Abs 9.5 (*)    Lymphocytes Relative 10 (*)    All other components within normal limits  BRAIN NATRIURETIC PEPTIDE  COMPREHENSIVE METABOLIC PANEL  PROTIME-INR  I-STAT CG4 LACTIC ACID, ED    Imaging Review No results found.   EKG Interpretation   Date/Time:  Monday 2014-09-21 21:17:31 EDT Ventricular Rate:  104 PR Interval:    QRS Duration: 102 QT Interval:  345 QTC Calculation: 454 R Axis:   57 Text Interpretation:  Atrial fibrillation Borderline low voltage,  extremity leads Abnormal R-wave  progression, early transition Repol abnrm  suggests ischemia, anterolateral Baseline wander  in lead(s) V5 V6 afib new  from prior Confirmed by Dartagnan Beavers  MD, Devarius Nelles ([id number]) on 10/06/2014 11:17:29 PM      MDM   Final diagnoses:  Abdominal pain    Patient given IV fluids and had abdominal plain films for possible obstruction followed by abdominal CT which showed evidence of pneumonia. Started on antibiotics. He is maintaining appropriate this time. Heart rate transplant with IV fluids. Will be admitted to step down by [facility]  CRITICAL CARE Performed by: Toy BakerALLEN,Jerimey Burridge T Total critical care time: 60 Critical care time was exclusive of separately billable procedures and treating other patients. Critical care was necessary to treat or prevent imminent or life-threatening deterioration. Critical care was time spent personally by me on the following activities: development of treatment plan with patient and/or surrogate as well as nursing, discussions with consultants, evaluation of patient's response to treatment, examination of patient, obtaining history from patient or surrogate, ordering and performing treatments and interventions, ordering and review of laboratory studies, ordering and review of radiographic studies, pulse oximetry and re-evaluation of patient's condition.     Lorre NickAnthony Danialle Dement, MD 09/19/14 0005

## 2014-09-18 NOTE — ED Notes (Signed)
Pt brought in by wife through EMS bay c/o constipation x5 days and bilateral leg swelling x3weeks.  HX COPD and PE.

## 2014-09-18 NOTE — ED Notes (Signed)
Freida BusmanAllen EDP made aware of patient CG4 lactic result.

## 2014-09-18 NOTE — ED Notes (Signed)
Patient transported to X-ray 

## 2014-09-19 ENCOUNTER — Encounter (HOSPITAL_COMMUNITY): Payer: Self-pay | Admitting: Family Medicine

## 2014-09-19 DIAGNOSIS — J449 Chronic obstructive pulmonary disease, unspecified: Secondary | ICD-10-CM | POA: Diagnosis present

## 2014-09-19 DIAGNOSIS — Z79891 Long term (current) use of opiate analgesic: Secondary | ICD-10-CM | POA: Diagnosis not present

## 2014-09-19 DIAGNOSIS — D696 Thrombocytopenia, unspecified: Secondary | ICD-10-CM | POA: Diagnosis present

## 2014-09-19 DIAGNOSIS — I252 Old myocardial infarction: Secondary | ICD-10-CM | POA: Diagnosis not present

## 2014-09-19 DIAGNOSIS — R6521 Severe sepsis with septic shock: Secondary | ICD-10-CM | POA: Diagnosis not present

## 2014-09-19 DIAGNOSIS — E162 Hypoglycemia, unspecified: Secondary | ICD-10-CM | POA: Diagnosis not present

## 2014-09-19 DIAGNOSIS — I5021 Acute systolic (congestive) heart failure: Secondary | ICD-10-CM | POA: Diagnosis not present

## 2014-09-19 DIAGNOSIS — K7200 Acute and subacute hepatic failure without coma: Secondary | ICD-10-CM | POA: Diagnosis not present

## 2014-09-19 DIAGNOSIS — L509 Urticaria, unspecified: Secondary | ICD-10-CM | POA: Diagnosis not present

## 2014-09-19 DIAGNOSIS — K529 Noninfective gastroenteritis and colitis, unspecified: Secondary | ICD-10-CM | POA: Diagnosis not present

## 2014-09-19 DIAGNOSIS — E785 Hyperlipidemia, unspecified: Secondary | ICD-10-CM

## 2014-09-19 DIAGNOSIS — Z981 Arthrodesis status: Secondary | ICD-10-CM | POA: Diagnosis not present

## 2014-09-19 DIAGNOSIS — N189 Chronic kidney disease, unspecified: Secondary | ICD-10-CM

## 2014-09-19 DIAGNOSIS — R188 Other ascites: Secondary | ICD-10-CM | POA: Diagnosis present

## 2014-09-19 DIAGNOSIS — I509 Heart failure, unspecified: Secondary | ICD-10-CM | POA: Diagnosis not present

## 2014-09-19 DIAGNOSIS — E872 Acidosis: Secondary | ICD-10-CM | POA: Diagnosis not present

## 2014-09-19 DIAGNOSIS — Z66 Do not resuscitate: Secondary | ICD-10-CM | POA: Diagnosis present

## 2014-09-19 DIAGNOSIS — Z9119 Patient's noncompliance with other medical treatment and regimen: Secondary | ICD-10-CM | POA: Diagnosis present

## 2014-09-19 DIAGNOSIS — E876 Hypokalemia: Secondary | ICD-10-CM | POA: Diagnosis not present

## 2014-09-19 DIAGNOSIS — R109 Unspecified abdominal pain: Secondary | ICD-10-CM | POA: Diagnosis not present

## 2014-09-19 DIAGNOSIS — J189 Pneumonia, unspecified organism: Secondary | ICD-10-CM | POA: Diagnosis present

## 2014-09-19 DIAGNOSIS — A419 Sepsis, unspecified organism: Secondary | ICD-10-CM

## 2014-09-19 DIAGNOSIS — N170 Acute kidney failure with tubular necrosis: Secondary | ICD-10-CM | POA: Diagnosis present

## 2014-09-19 DIAGNOSIS — E875 Hyperkalemia: Secondary | ICD-10-CM | POA: Diagnosis not present

## 2014-09-19 DIAGNOSIS — K5900 Constipation, unspecified: Secondary | ICD-10-CM | POA: Diagnosis present

## 2014-09-19 DIAGNOSIS — N179 Acute kidney failure, unspecified: Secondary | ICD-10-CM | POA: Diagnosis not present

## 2014-09-19 DIAGNOSIS — I4892 Unspecified atrial flutter: Secondary | ICD-10-CM | POA: Diagnosis present

## 2014-09-19 DIAGNOSIS — Z951 Presence of aortocoronary bypass graft: Secondary | ICD-10-CM | POA: Diagnosis not present

## 2014-09-19 DIAGNOSIS — R579 Shock, unspecified: Secondary | ICD-10-CM | POA: Diagnosis not present

## 2014-09-19 DIAGNOSIS — R1010 Upper abdominal pain, unspecified: Secondary | ICD-10-CM | POA: Diagnosis not present

## 2014-09-19 DIAGNOSIS — F329 Major depressive disorder, single episode, unspecified: Secondary | ICD-10-CM | POA: Diagnosis present

## 2014-09-19 DIAGNOSIS — I739 Peripheral vascular disease, unspecified: Secondary | ICD-10-CM | POA: Diagnosis present

## 2014-09-19 DIAGNOSIS — Z809 Family history of malignant neoplasm, unspecified: Secondary | ICD-10-CM | POA: Diagnosis not present

## 2014-09-19 DIAGNOSIS — I5023 Acute on chronic systolic (congestive) heart failure: Secondary | ICD-10-CM | POA: Diagnosis not present

## 2014-09-19 DIAGNOSIS — T45515A Adverse effect of anticoagulants, initial encounter: Secondary | ICD-10-CM | POA: Diagnosis present

## 2014-09-19 DIAGNOSIS — E870 Hyperosmolality and hypernatremia: Secondary | ICD-10-CM | POA: Diagnosis not present

## 2014-09-19 DIAGNOSIS — Z8249 Family history of ischemic heart disease and other diseases of the circulatory system: Secondary | ICD-10-CM | POA: Diagnosis not present

## 2014-09-19 DIAGNOSIS — R1084 Generalized abdominal pain: Secondary | ICD-10-CM | POA: Diagnosis not present

## 2014-09-19 DIAGNOSIS — I272 Other secondary pulmonary hypertension: Secondary | ICD-10-CM | POA: Diagnosis present

## 2014-09-19 DIAGNOSIS — R64 Cachexia: Secondary | ICD-10-CM | POA: Diagnosis present

## 2014-09-19 DIAGNOSIS — K219 Gastro-esophageal reflux disease without esophagitis: Secondary | ICD-10-CM

## 2014-09-19 DIAGNOSIS — J9811 Atelectasis: Secondary | ICD-10-CM | POA: Diagnosis not present

## 2014-09-19 DIAGNOSIS — Y95 Nosocomial condition: Secondary | ICD-10-CM | POA: Diagnosis present

## 2014-09-19 DIAGNOSIS — Z8673 Personal history of transient ischemic attack (TIA), and cerebral infarction without residual deficits: Secondary | ICD-10-CM | POA: Diagnosis not present

## 2014-09-19 DIAGNOSIS — M109 Gout, unspecified: Secondary | ICD-10-CM | POA: Diagnosis present

## 2014-09-19 DIAGNOSIS — F17210 Nicotine dependence, cigarettes, uncomplicated: Secondary | ICD-10-CM | POA: Diagnosis present

## 2014-09-19 DIAGNOSIS — R21 Rash and other nonspecific skin eruption: Secondary | ICD-10-CM | POA: Diagnosis not present

## 2014-09-19 DIAGNOSIS — I5043 Acute on chronic combined systolic (congestive) and diastolic (congestive) heart failure: Secondary | ICD-10-CM | POA: Diagnosis present

## 2014-09-19 DIAGNOSIS — N183 Chronic kidney disease, stage 3 (moderate): Secondary | ICD-10-CM | POA: Diagnosis present

## 2014-09-19 DIAGNOSIS — I472 Ventricular tachycardia: Secondary | ICD-10-CM | POA: Diagnosis not present

## 2014-09-19 DIAGNOSIS — Y838 Other surgical procedures as the cause of abnormal reaction of the patient, or of later complication, without mention of misadventure at the time of the procedure: Secondary | ICD-10-CM | POA: Diagnosis not present

## 2014-09-19 DIAGNOSIS — I340 Nonrheumatic mitral (valve) insufficiency: Secondary | ICD-10-CM | POA: Diagnosis not present

## 2014-09-19 DIAGNOSIS — Z79899 Other long term (current) drug therapy: Secondary | ICD-10-CM | POA: Diagnosis not present

## 2014-09-19 DIAGNOSIS — E440 Moderate protein-calorie malnutrition: Secondary | ICD-10-CM | POA: Diagnosis present

## 2014-09-19 DIAGNOSIS — I130 Hypertensive heart and chronic kidney disease with heart failure and stage 1 through stage 4 chronic kidney disease, or unspecified chronic kidney disease: Secondary | ICD-10-CM | POA: Diagnosis present

## 2014-09-19 DIAGNOSIS — Z86711 Personal history of pulmonary embolism: Secondary | ICD-10-CM | POA: Diagnosis not present

## 2014-09-19 DIAGNOSIS — N280 Ischemia and infarction of kidney: Secondary | ICD-10-CM | POA: Diagnosis present

## 2014-09-19 DIAGNOSIS — F0390 Unspecified dementia without behavioral disturbance: Secondary | ICD-10-CM | POA: Diagnosis present

## 2014-09-19 DIAGNOSIS — J9601 Acute respiratory failure with hypoxia: Secondary | ICD-10-CM | POA: Diagnosis not present

## 2014-09-19 DIAGNOSIS — T502X5A Adverse effect of carbonic-anhydrase inhibitors, benzothiadiazides and other diuretics, initial encounter: Secondary | ICD-10-CM | POA: Diagnosis not present

## 2014-09-19 DIAGNOSIS — J95811 Postprocedural pneumothorax: Secondary | ICD-10-CM | POA: Diagnosis not present

## 2014-09-19 DIAGNOSIS — I480 Paroxysmal atrial fibrillation: Secondary | ICD-10-CM | POA: Diagnosis not present

## 2014-09-19 DIAGNOSIS — R609 Edema, unspecified: Secondary | ICD-10-CM | POA: Diagnosis not present

## 2014-09-19 DIAGNOSIS — I2510 Atherosclerotic heart disease of native coronary artery without angina pectoris: Secondary | ICD-10-CM | POA: Diagnosis present

## 2014-09-19 DIAGNOSIS — R1314 Dysphagia, pharyngoesophageal phase: Secondary | ICD-10-CM | POA: Diagnosis not present

## 2014-09-19 DIAGNOSIS — N529 Male erectile dysfunction, unspecified: Secondary | ICD-10-CM | POA: Diagnosis present

## 2014-09-19 DIAGNOSIS — Z515 Encounter for palliative care: Secondary | ICD-10-CM | POA: Diagnosis not present

## 2014-09-19 DIAGNOSIS — I493 Ventricular premature depolarization: Secondary | ICD-10-CM | POA: Diagnosis not present

## 2014-09-19 DIAGNOSIS — J90 Pleural effusion, not elsewhere classified: Secondary | ICD-10-CM | POA: Diagnosis not present

## 2014-09-19 DIAGNOSIS — G9341 Metabolic encephalopathy: Secondary | ICD-10-CM | POA: Diagnosis not present

## 2014-09-19 DIAGNOSIS — D649 Anemia, unspecified: Secondary | ICD-10-CM | POA: Diagnosis present

## 2014-09-19 DIAGNOSIS — Z6824 Body mass index (BMI) 24.0-24.9, adult: Secondary | ICD-10-CM | POA: Diagnosis not present

## 2014-09-19 DIAGNOSIS — J948 Other specified pleural conditions: Secondary | ICD-10-CM | POA: Diagnosis not present

## 2014-09-19 DIAGNOSIS — Z7901 Long term (current) use of anticoagulants: Secondary | ICD-10-CM | POA: Diagnosis not present

## 2014-09-19 DIAGNOSIS — A408 Other streptococcal sepsis: Secondary | ICD-10-CM | POA: Diagnosis not present

## 2014-09-19 DIAGNOSIS — R1310 Dysphagia, unspecified: Secondary | ICD-10-CM | POA: Diagnosis not present

## 2014-09-19 LAB — CBC
HCT: 33.4 % — ABNORMAL LOW (ref 39.0–52.0)
Hemoglobin: 9.7 g/dL — ABNORMAL LOW (ref 13.0–17.0)
MCH: 24.4 pg — ABNORMAL LOW (ref 26.0–34.0)
MCHC: 29 g/dL — ABNORMAL LOW (ref 30.0–36.0)
MCV: 84.1 fL (ref 78.0–100.0)
Platelets: 201 10*3/uL (ref 150–400)
RBC: 3.97 MIL/uL — ABNORMAL LOW (ref 4.22–5.81)
RDW: 19.7 % — AB (ref 11.5–15.5)
WBC: 10.8 10*3/uL — ABNORMAL HIGH (ref 4.0–10.5)

## 2014-09-19 LAB — URINALYSIS, ROUTINE W REFLEX MICROSCOPIC
Bilirubin Urine: NEGATIVE
Glucose, UA: NEGATIVE mg/dL
Hgb urine dipstick: NEGATIVE
Ketones, ur: NEGATIVE mg/dL
Leukocytes, UA: NEGATIVE
NITRITE: NEGATIVE
PH: 5.5 (ref 5.0–8.0)
Protein, ur: NEGATIVE mg/dL
SPECIFIC GRAVITY, URINE: 1.014 (ref 1.005–1.030)
UROBILINOGEN UA: 0.2 mg/dL (ref 0.0–1.0)

## 2014-09-19 LAB — LACTIC ACID, PLASMA: Lactic Acid, Venous: 2.5 mmol/L (ref 0.5–2.0)

## 2014-09-19 LAB — MRSA PCR SCREENING: MRSA by PCR: NEGATIVE

## 2014-09-19 LAB — HIV ANTIBODY (ROUTINE TESTING W REFLEX): HIV SCREEN 4TH GENERATION: NONREACTIVE

## 2014-09-19 LAB — COMPREHENSIVE METABOLIC PANEL
ALBUMIN: 2.5 g/dL — AB (ref 3.5–5.0)
ALT: 145 U/L — ABNORMAL HIGH (ref 17–63)
AST: 127 U/L — AB (ref 15–41)
Alkaline Phosphatase: 122 U/L (ref 38–126)
Anion gap: 12 (ref 5–15)
BILIRUBIN TOTAL: 1.8 mg/dL — AB (ref 0.3–1.2)
BUN: 57 mg/dL — ABNORMAL HIGH (ref 6–20)
CALCIUM: 8.3 mg/dL — AB (ref 8.9–10.3)
CHLORIDE: 98 mmol/L — AB (ref 101–111)
CO2: 23 mmol/L (ref 22–32)
CREATININE: 2.88 mg/dL — AB (ref 0.61–1.24)
GFR calc Af Amer: 23 mL/min — ABNORMAL LOW (ref >60)
GFR calc non Af Amer: 20 mL/min — ABNORMAL LOW (ref >60)
Glucose, Bld: 109 mg/dL — ABNORMAL HIGH (ref 70–99)
Potassium: 5 mmol/L (ref 3.5–5.1)
Sodium: 133 mmol/L — ABNORMAL LOW (ref 135–145)
Total Protein: 6.4 g/dL — ABNORMAL LOW (ref 6.5–8.1)

## 2014-09-19 LAB — PROTIME-INR
INR: >10 (ref 0.00–1.49)
Prothrombin Time: 88 seconds — ABNORMAL HIGH (ref 11.6–15.2)

## 2014-09-19 LAB — PROCALCITONIN: Procalcitonin: 0.26 ng/mL

## 2014-09-19 LAB — APTT: APTT: 60 s — AB (ref 24–37)

## 2014-09-19 LAB — OCCULT BLOOD X 1 CARD TO LAB, STOOL: Fecal Occult Bld: NEGATIVE

## 2014-09-19 LAB — STREP PNEUMONIAE URINARY ANTIGEN: STREP PNEUMO URINARY ANTIGEN: NEGATIVE

## 2014-09-19 MED ORDER — PANTOPRAZOLE SODIUM 40 MG PO TBEC
40.0000 mg | DELAYED_RELEASE_TABLET | Freq: Every day | ORAL | Status: DC
  Administered 2014-09-19 – 2014-09-21 (×3): 40 mg via ORAL
  Filled 2014-09-19 (×3): qty 1

## 2014-09-19 MED ORDER — ATORVASTATIN CALCIUM 10 MG PO TABS
10.0000 mg | ORAL_TABLET | Freq: Every day | ORAL | Status: DC
  Administered 2014-09-19: 10 mg via ORAL
  Filled 2014-09-19 (×2): qty 1

## 2014-09-19 MED ORDER — VANCOMYCIN HCL IN DEXTROSE 750-5 MG/150ML-% IV SOLN
750.0000 mg | INTRAVENOUS | Status: DC
  Administered 2014-09-20: 750 mg via INTRAVENOUS
  Filled 2014-09-19: qty 150

## 2014-09-19 MED ORDER — SENNA 8.6 MG PO TABS
1.0000 | ORAL_TABLET | Freq: Every day | ORAL | Status: DC
  Administered 2014-09-19 – 2014-10-02 (×10): 8.6 mg via ORAL
  Filled 2014-09-19 (×11): qty 1

## 2014-09-19 MED ORDER — COLCHICINE 0.6 MG PO TABS
0.6000 mg | ORAL_TABLET | Freq: Every day | ORAL | Status: DC
  Administered 2014-09-19: 0.6 mg via ORAL
  Filled 2014-09-19 (×2): qty 1

## 2014-09-19 MED ORDER — BISACODYL 5 MG PO TBEC
10.0000 mg | DELAYED_RELEASE_TABLET | Freq: Once | ORAL | Status: DC
  Filled 2014-09-19: qty 2

## 2014-09-19 MED ORDER — POTASSIUM CHLORIDE ER 10 MEQ PO TBCR
20.0000 meq | EXTENDED_RELEASE_TABLET | Freq: Two times a day (BID) | ORAL | Status: DC
  Administered 2014-09-19 (×2): 20 meq via ORAL
  Filled 2014-09-19 (×7): qty 2

## 2014-09-19 MED ORDER — TIOTROPIUM BROMIDE MONOHYDRATE 18 MCG IN CAPS
18.0000 ug | ORAL_CAPSULE | Freq: Every day | RESPIRATORY_TRACT | Status: DC
  Administered 2014-09-19 – 2014-09-21 (×3): 18 ug via RESPIRATORY_TRACT
  Filled 2014-09-19: qty 5

## 2014-09-19 MED ORDER — DOCUSATE SODIUM 100 MG PO CAPS
100.0000 mg | ORAL_CAPSULE | Freq: Two times a day (BID) | ORAL | Status: DC
  Administered 2014-09-20 – 2014-09-21 (×3): 100 mg via ORAL
  Filled 2014-09-19 (×4): qty 1

## 2014-09-19 MED ORDER — PIPERACILLIN-TAZOBACTAM 3.375 G IVPB
3.3750 g | Freq: Three times a day (TID) | INTRAVENOUS | Status: DC
  Administered 2014-09-19 – 2014-09-20 (×4): 3.375 g via INTRAVENOUS
  Filled 2014-09-19 (×3): qty 50

## 2014-09-19 MED ORDER — PHYTONADIONE 5 MG PO TABS
2.5000 mg | ORAL_TABLET | Freq: Once | ORAL | Status: AC
  Administered 2014-09-19: 2.5 mg via ORAL
  Filled 2014-09-19: qty 1

## 2014-09-19 MED ORDER — DEXTROSE 5 % IV SOLN: 1.0000 g | INTRAVENOUS | Status: DC

## 2014-09-19 MED ORDER — BUPROPION HCL ER (XL) 300 MG PO TB24
300.0000 mg | ORAL_TABLET | Freq: Every day | ORAL | Status: DC
  Administered 2014-09-19 – 2014-09-21 (×3): 300 mg via ORAL
  Filled 2014-09-19 (×3): qty 1

## 2014-09-19 MED ORDER — OXYCODONE HCL 5 MG PO TABS
5.0000 mg | ORAL_TABLET | Freq: Four times a day (QID) | ORAL | Status: DC | PRN
  Administered 2014-09-19 – 2014-09-21 (×3): 5 mg via ORAL
  Filled 2014-09-19 (×3): qty 1

## 2014-09-19 MED ORDER — BUDESONIDE-FORMOTEROL FUMARATE 160-4.5 MCG/ACT IN AERO
2.0000 | INHALATION_SPRAY | Freq: Two times a day (BID) | RESPIRATORY_TRACT | Status: DC
  Administered 2014-09-19 – 2014-09-21 (×6): 2 via RESPIRATORY_TRACT
  Filled 2014-09-19: qty 6

## 2014-09-19 MED ORDER — CARVEDILOL 3.125 MG PO TABS
3.1250 mg | ORAL_TABLET | Freq: Two times a day (BID) | ORAL | Status: DC
  Administered 2014-09-19 – 2014-09-21 (×4): 3.125 mg via ORAL
  Filled 2014-09-19 (×6): qty 1

## 2014-09-19 MED ORDER — SODIUM CHLORIDE 0.9 % IJ SOLN
3.0000 mL | Freq: Two times a day (BID) | INTRAMUSCULAR | Status: DC
  Administered 2014-09-19 – 2014-10-02 (×16): 3 mL via INTRAVENOUS

## 2014-09-19 MED ORDER — MIRTAZAPINE 15 MG PO TABS
7.5000 mg | ORAL_TABLET | Freq: Every day | ORAL | Status: DC
  Administered 2014-09-19 – 2014-09-21 (×3): 7.5 mg via ORAL
  Filled 2014-09-19 (×5): qty 1

## 2014-09-19 MED ORDER — POLYETHYLENE GLYCOL 3350 17 G PO PACK
17.0000 g | PACK | Freq: Every day | ORAL | Status: DC
  Administered 2014-09-19 – 2014-09-21 (×3): 17 g via ORAL
  Filled 2014-09-19 (×3): qty 1

## 2014-09-19 MED ORDER — SODIUM CHLORIDE 0.9 % IV SOLN
INTRAVENOUS | Status: DC
  Administered 2014-09-19: 10:00:00 via INTRAVENOUS

## 2014-09-19 NOTE — ED Notes (Signed)
Spoke to Dr Cena BentonVega Re: Diet order, PT/INR levels, admin of Coreg  Coreg to be given at this time, Dr Cena BentonVega to review chart and write additional orders.

## 2014-09-19 NOTE — Progress Notes (Signed)
Pt >90, inr >10. MD notified.

## 2014-09-19 NOTE — Care Management Note (Signed)
Case Management Note  Patient Details  Name: Nathan [name] MRN: [id number] Date of Birth: 11/17/1940  Subjective/Objective:                  sepsis  Action/Plan:  Home when stable   Expected Discharge Date:   (UNKNOWN)               Expected Discharge Plan:  Home/Self Care  In-House Referral:  NA  Discharge planning Services  CM Consult  Post Acute Care Choice:  NA Choice offered to:  NA  DME Arranged:    DME Agency:     HH Arranged:    HH Agency:     Status of Service:  In process, will continue to follow  Medicare Important Message Given:    Date Medicare IM Given:    Medicare IM give by:    Date Additional Medicare IM Given:    Additional Medicare Important Message give by:     If discussed at Long Length of Stay Meetings, dates discussed:    Additional Comments:  Golda AcreDavis, Cleto Claggett Lynn, RN 09/19/2014, 1:26 PM

## 2014-09-19 NOTE — Progress Notes (Signed)
Date:  Sep 19, 2014 U.R. performed for needs and level of care. Will continue to follow for Case Management needs.  Tylene Quashie, RN, BSN, CCM   [phone] 

## 2014-09-19 NOTE — Progress Notes (Addendum)
ANTIBIOTIC CONSULT NOTE - INITIAL  Pharmacy Consult for Zosyn and Vancomycin Indication: Sepsis  No Known Allergies  Patient Measurements: Weight: 162 lb 0.6 oz (73.5 kg)   Vital Signs: Temp: 97.4 F (36.3 C) (05/09 2117) Temp Source: Oral (05/09 2117) BP: 98/65 mmHg (05/09 2307) Pulse Rate: 128 (05/09 2117) Intake/Output from previous day:   Intake/Output from this shift:    Labs:  Recent Labs  09/11/2014 2159  WBC 11.7*  HGB 10.5*  PLT 244  CREATININE 2.90*   Estimated Creatinine Clearance: 22.9 mL/min (by C-G formula based on Cr of 2.9). No results for input(s): VANCOTROUGH, VANCOPEAK, VANCORANDOM, GENTTROUGH, GENTPEAK, GENTRANDOM, TOBRATROUGH, TOBRAPEAK, TOBRARND, AMIKACINPEAK, AMIKACINTROU, AMIKACIN in the last 72 hours.   Microbiology: No results found for this or any previous visit (from the past 720 hour(s)).  Medical History: Past Medical History  Diagnosis Date  . Aorto-iliac disease     a. s/p bilat with stent 2007 - followed by [facility].  . CAD (coronary artery disease)     a. 10/2011 s/p CABG x4 (LIMA-LAD, SVG-Diag, SVG-OM1 and SVG-PDA)  b. 03/2012 NSTEMI s/p LHC SVG-RCA and LIMA-LAD but total occlusion of SVG-OM as well as SVG-diag. RX therapy recommended   . Hypertension   . ED (erectile dysfunction)   . COPD, severe   . Hyperlipidemia   . Stroke   . GERD (gastroesophageal reflux disease)   . Gout   . Paroxysmal atrial flutter     ablation  . Chronic combined systolic and diastolic CHF (congestive heart failure)     a. Last echo 04/2014: EF 40-45%.  Marland Kitchen. PAF (paroxysmal atrial fibrillation)   . CKD (chronic kidney disease), stage III   . Tobacco abuse   . History of noncompliance with medical treatment   . Carotid artery disease     a. RCEA 2013 - followed by [facility].  . Anemia   . Lower GI bleed     a. LGIB 10/2012: Colonoscopy performed June 21 showed blood in the colon but no definite source identified, diverticular versus AVM suspected. Aspirin  stopped.  . Mitral regurgitation   . Hypertensive heart disease   . PE (pulmonary embolism)     Medications:   (Not in a hospital admission) Scheduled:   Infusions:  . sodium chloride    . sodium chloride    . ceFEPime (MAXIPIME) IV    . vancomycin     Assessment: 73 yoM with hx COPD and PE c/o constipation and bilateral leg swelling x 3 weeks.  Zosyn and Vancomycin per Rx for Sepsis.  Goal of Therapy:  Vancomycin trough level 15-20 mcg/ml  Plan:   Zosyn 3.375 Gm IV q8h EI  Vancomycin 1Gm x1 then 750mg  IV q24h  F/u SCr/cultures/levels  Susanne GreenhouseGreen, Walt Geathers R 09/19/2014,12:01 AM

## 2014-09-19 NOTE — ED Notes (Signed)
Patient moved to hospital bed for comfort 

## 2014-09-19 NOTE — Progress Notes (Signed)
[facility] PROGRESS NOTE  Allen KellMaurice W Therrien ZOX:[id number] DOB: 11/04/1940 DOA: 10/04/2014 PCP: Lillia MountainGRIFFIN,JOHN JOSEPH, MD  Assessment/Plan: Principal Problem:   Sepsis -Secondary to colitis and/or pneumonia - Continue current antibiotic regimen - Agree with transferring to stepdown -Monitor white blood cell counts  Supratherapeutic INR - No active bleeding as such we'll not provide FFP. -We'll provide oral vitamin K 2.5 mg orally 1 - Continue to hold Coumadin  Active Problems:   HCAP (healthcare-associated pneumonia) -Continue Vanco and Zosyn. - Reported on CT of abdomen    Colitis -Also reported on CT of abdomen - No bloody BMs reported. Continue current antibiotic regimen.     AKI (acute kidney injury) on chronic kidney disease -Patient has high BUN/creatinine ratio is such I suspect prerenal etiology contributing. We'll continue with IV fluids and reassess next a.m. -Patient has received several hours of 100 mL's of normal saline per hour given history of systolic congestive heart failure will saline lock    Acute systolic congestive heart failure -Saline lock - Continue carvedilol    HLD (hyperlipidemia) -Stable continue statin   Code Status: Full Family Communication: No family at bedside Disposition Plan: Pending improvement in condition   Consultants:  None  Procedures:  None  Antibiotics:  Vancomycin and Zosyn  HPI/Subjective: Pt has no new complaints. He denies any active bleeding.  Objective: Filed Vitals:   09/19/14 1130  BP: 97/79  Pulse:   Temp:   Resp: 18    Intake/Output Summary (Last 24 hours) at 09/19/14 1318 Last data filed at 09/19/14 0705  Gross per 24 hour  Intake    120 ml  Output     90 ml  Net     30 ml   Filed Weights   01/24/15 2358  Weight: 73.5 kg (162 lb 0.6 oz)    Exam:   General:  Pt in nad, alert and awake  Cardiovascular: s1 and s2 present, no rubs  Respiratory: cta bl, no wheezes  Abdomen:  soft, ND, NT  Musculoskeletal: no cyanosis   Data Reviewed: Basic Metabolic Panel:  Recent Labs Lab 01/24/15 2159 09/19/14 0410  NA 133* 133*  K 4.9 5.0  CL 95* 98*  CO2 23 23  GLUCOSE 89 109*  BUN 59* 57*  CREATININE 2.90* 2.88*  CALCIUM 8.8* 8.3*   Liver Function Tests:  Recent Labs Lab 01/24/15 2159 09/19/14 0410  AST 143* 127*  ALT 153* 145*  ALKPHOS 143* 122  BILITOT 2.0* 1.8*  PROT 7.4 6.4*  ALBUMIN 2.9* 2.5*   No results for input(s): LIPASE, AMYLASE in the last 168 hours. No results for input(s): AMMONIA in the last 168 hours. CBC:  Recent Labs Lab 01/24/15 2159 09/19/14 0410  WBC 11.7* 10.8*  NEUTROABS 9.5*  --   HGB 10.5* 9.7*  HCT 36.3* 33.4*  MCV 84.4 84.1  PLT 244 201   Cardiac Enzymes: No results for input(s): CKTOTAL, CKMB, CKMBINDEX, TROPONINI in the last 168 hours. BNP (last 3 results)  Recent Labs  06/26/14 0357 07/02/14 0410 01/24/15 2159  BNP 626.8* 703.3* 1350.8*    ProBNP (last 3 results)  Recent Labs  01/04/14 0450 04/21/14 1504  PROBNP 3786.0* 13092.0*    CBG: No results for input(s): GLUCAP in the last 168 hours.  No results found for this or any previous visit (from the past 240 hour(s)).   Studies: Ct Abdomen Pelvis Wo Contrast  09/13/2014   CLINICAL DATA:  Constipation for 5 days and bilateral leg swelling for 3  weeks. History of COPD and PE. No IV or oral contrast material per order of the referring physician.  EXAM: CT ABDOMEN AND PELVIS WITHOUT CONTRAST  TECHNIQUE: Multidetector CT imaging of the abdomen and pelvis was performed following the standard protocol without IV contrast.  COMPARISON:  None.  FINDINGS: Small bilateral pleural effusions, greater on the right. Basilar atelectasis. Consolidation in the right lower lung posteriorly probably represents pneumonia. Postoperative changes in the mediastinum. Mild cardiac enlargement.  Small amount of free fluid in the abdomen and extending along the pericolic  gutters into the pelvis. This is likely ascites. Increased density in the gallbladder may represent sludge or milk of calcium. No bile duct dilatation. Unenhanced appearance of the liver, spleen, pancreas, adrenal glands, inferior vena cava, and retroperitoneal lymph nodes is unremarkable. Calcifications in both kidneys probably representing vascular calcification. No hydronephrosis or hydroureter. Calcification throughout the abdominal aorta without aneurysm. Stenosis at the aortic bifurcation is probable. Vascular stents in the iliac arteries. Stomach, small bowel, and colon are not abnormally distended. Stool fills the colon. No free air in the abdomen.  Pelvis: Stool-filled rectosigmoid colon. There is some stranding around the sigmoid region which may indicate early changes of acute diverticulitis. No abscess. Bladder wall is not thickened. Prostate gland is not enlarged. Appendix is normal. Small left inguinal hernia containing fat and fluid. Degenerative changes in the spine and hips. Old fracture deformity of the right inferior pubic ramus.  IMPRESSION: Small bilateral pleural effusions greater on the right. Consolidation in the right lower lung suggesting pneumonia. Small amount of abdominal and pelvic free fluid. Increased density suggesting sludge versus milk of calcium in the gallbladder. Mild stranding around the sigmoid colon suggesting early diverticulitis. No abscess. Left inguinal hernia containing fat and fluid.   Electronically Signed   By: Burman Nieves M.D.   On: 10/05/2014 23:34   Dg Abd Acute W/chest  10/02/2014   CLINICAL DATA:  Constipation for 5 days. Bilateral leg swelling for 3 weeks. Abdominal pain. History of COPD and PE.  EXAM: DG ABDOMEN ACUTE W/ 1V CHEST  COMPARISON:  Chest 07/10/2014.  Abdomen 07/04/2014.  FINDINGS: Postoperative changes in the mediastinum. Mild cardiac enlargement without vascular congestion. Small bilateral pleural effusions, greater on the right. Bilateral  basilar atelectasis. Emphysematous changes in the lungs. No pneumothorax. Calcification of the aorta.  Interval resolution of previous colonic ileus. Diffusely stool-filled colon. No small or large bowel distention. No free intra-abdominal air. No abnormal air-fluid levels. Vascular stents in vascular calcifications present. Degenerative changes in the spine and hips.  IMPRESSION: Cardiac enlargement. Bilateral pleural effusions and basilar atelectasis, greater on the right. Normal nonobstructive bowel gas pattern.   Electronically Signed   By: Burman Nieves M.D.   On: 09/13/2014 22:34    Scheduled Meds: . atorvastatin  10 mg Oral q1800  . budesonide-formoterol  2 puff Inhalation BID  . buPROPion  300 mg Oral Daily  . carvedilol  3.125 mg Oral BID WC  . colchicine  0.6 mg Oral Daily  . mirtazapine  7.5 mg Oral QHS  . pantoprazole  40 mg Oral Daily  . piperacillin-tazobactam (ZOSYN)  IV  3.375 g Intravenous Q8H  . polyethylene glycol  17 g Oral Daily  . potassium chloride  20 mEq Oral BID  . senna  1 tablet Oral Daily  . sodium chloride  3 mL Intravenous Q12H  . tiotropium  18 mcg Inhalation Daily  . vancomycin  750 mg Intravenous Q24H   Continuous Infusions: .  sodium chloride 100 mL/hr at 09/19/14 0951     Time spent: > 35 minutes    Penny PiaVEGA, Caydence Koenig  [facility] Pager [phone]  If 7PM-7AM, please contact night-coverage at [email or web address], password [email or web address] 09/19/2014, 1:18 PM  LOS: 0 days

## 2014-09-19 NOTE — Progress Notes (Signed)
ED CM spoke with Lore[facility]ristie at [facility] who confirms pt d/c from [facility] 08/[facility]/14 with [facility]   [phone] of home health coordinator of [facility] updated on pt admission and dx to follow for d/c needs

## 2014-09-19 NOTE — Progress Notes (Signed)
ANTICOAGULATION CONSULT NOTE - Initial Consult  Pharmacy Consult for Warfarin Indication: atrial fibrillation/hx PE  No Known Allergies  Patient Measurements: Weight: 162 lb 0.6 oz (73.5 kg)   Vital Signs: Temp: 97.4 F (36.3 C) (05/09 2117) Temp Source: Oral (05/09 2117) BP: 118/78 mmHg (05/10 0200) Pulse Rate: 106 (05/10 0200)  Labs:  Recent Labs  09/21/2014 2159  HGB 10.5*  HCT 36.3*  PLT 244  LABPROT 74.0*  INR 9.00*  CREATININE 2.90*    Estimated Creatinine Clearance: 22.9 mL/min (by C-G formula based on Cr of 2.9).   Medical History: Past Medical History  Diagnosis Date  . Aorto-iliac disease     a. s/p bilat with stent 2007 - followed by [facility].  . CAD (coronary artery disease)     a. 10/2011 s/p CABG x4 (LIMA-LAD, SVG-Diag, SVG-OM1 and SVG-PDA)  b. 03/2012 NSTEMI s/p LHC SVG-RCA and LIMA-LAD but total occlusion of SVG-OM as well as SVG-diag. RX therapy recommended   . Hypertension   . ED (erectile dysfunction)   . COPD, severe   . Hyperlipidemia   . Stroke   . GERD (gastroesophageal reflux disease)   . Gout   . Paroxysmal atrial flutter     ablation  . Chronic combined systolic and diastolic CHF (congestive heart failure)     a. Last echo 04/2014: EF 40-45%.  Marland Kitchen. PAF (paroxysmal atrial fibrillation)   . CKD (chronic kidney disease), stage III   . Tobacco abuse   . History of noncompliance with medical treatment   . Carotid artery disease     a. RCEA 2013 - followed by [facility].  . Anemia   . Lower GI bleed     a. LGIB 10/2012: Colonoscopy performed June 21 showed blood in the colon but no definite source identified, diverticular versus AVM suspected. Aspirin stopped.  . Mitral regurgitation   . Hypertensive heart disease   . PE (pulmonary embolism)     Medications:   (Not in a hospital admission) Scheduled:  . atorvastatin  10 mg Oral q1800  . budesonide-formoterol  2 puff Inhalation BID  . buPROPion  300 mg Oral Daily  . carvedilol  3.125 mg Oral  BID WC  . colchicine  0.6 mg Oral Daily  . mirtazapine  7.5 mg Oral QHS  . pantoprazole  40 mg Oral Daily  . polyethylene glycol  17 g Oral Daily  . potassium chloride  20 mEq Oral BID  . senna  1 tablet Oral Daily  . sodium chloride  3 mL Intravenous Q12H  . tiotropium  18 mcg Inhalation Daily  . vancomycin  750 mg Intravenous Q24H   Infusions:  . sodium chloride 100 mL/hr at 09/19/14 0229  . piperacillin-tazobactam (ZOSYN)  IV      Assessment: [age] yoM with hx COPD, PE and A-fib c/o constipation x 5 days and bilateral leg swelling x 3 weeks.  Warfarin per Rx for A-fib.  HD= 4mg  daily, LD 5/9, INR=9 Goal of Therapy:  INR 2-3    Plan:   Daily PT/INR  No warfarin today (INR=9)  Susanne GreenhouseGreen, Keliah Harned R 09/19/2014,2:55 AM

## 2014-09-19 NOTE — ED Notes (Signed)
Dr. Vega at bedside. 

## 2014-09-19 NOTE — ED Notes (Signed)
Patient may have jello per Dr Konrad DoloresMerrell.

## 2014-09-19 NOTE — H&P (Signed)
[facility] History and Physical  Nathan [name] EAV:[id number] DOB: 03-Jun-1940 DOA: 09/30/14  Referring physician: Dr Freida Busman [place] PCP: Lillia Mountain, MD   Chief Complaint: COnstipation and emesis  HPI: Nathan [name] is a 74 y.o. male  Patient presenting with three-day history of constipation. Denies recent melonic stools or hematochezia during this period of time. No BM during this time. OTC constipation medications w/o relief. Associate with Abdominal pain but denies fevers and chills dysuria. Patient's been unable to keep his medications down during this period of time. Symptoms come and go but are becoming progressively worse. Patient becoming significantly more weak and unable to care for himself at home. Nothing makes his symptoms better.  Emesis is described as dark and nonbilious.  Review of Systems:  Constitutional:  No weight loss, night sweats, chills.  HEENT:  No headaches, Difficulty swallowing,Tooth/dental problems,Sore throat,  No sneezing, itching, ear ache, nasal congestion, post nasal drip,  Cardio-vascular:  No chest pain, Orthopnea, PND,  dizziness, palpitations  GI: Per HPI Resp:   No shortness of breath with exertion or at rest. No excess mucus, no productive cough, No non-productive cough, No coughing up of blood.No change in color of mucus.No wheezing.No chest wall deformity  Skin:  no rash or lesions.  GU:  no dysuria, change in color of urine, no urgency or frequency. No flank pain.  Musculoskeletal:   No joint pain or swelling. No decreased range of motion. No back pain.  Psych:  No change in mood or affect. No depression or anxiety. No memory loss.   Past Medical History  Diagnosis Date  . Aorto-iliac disease     a. s/p bilat with stent 2007 - followed by [facility].  . CAD (coronary artery disease)     a. 10/2011 s/p CABG x4 (LIMA-LAD, SVG-Diag, SVG-OM1 and SVG-PDA)  b. 03/2012 NSTEMI s/p LHC SVG-RCA and LIMA-LAD but total occlusion of  SVG-OM as well as SVG-diag. RX therapy recommended   . Hypertension   . ED (erectile dysfunction)   . COPD, severe   . Hyperlipidemia   . Stroke   . GERD (gastroesophageal reflux disease)   . Gout   . Paroxysmal atrial flutter     ablation  . Chronic combined systolic and diastolic CHF (congestive heart failure)     a. Last echo 04/2014: EF 40-45%.  Marland Kitchen PAF (paroxysmal atrial fibrillation)   . CKD (chronic kidney disease), stage III   . Tobacco abuse   . History of noncompliance with medical treatment   . Carotid artery disease     a. RCEA 2013 - followed by [facility].  . Anemia   . Lower GI bleed     a. LGIB 10/2012: Colonoscopy performed June 21 showed blood in the colon but no definite source identified, diverticular versus AVM suspected. Aspirin stopped.  . Mitral regurgitation   . Hypertensive heart disease   . PE (pulmonary embolism)    Past Surgical History  Procedure Laterality Date  . Spinal fusion    . Hand surgery    . Neck fusion  1975  . Eye surgery    . Coronary artery bypass graft  10/31/2011    Procedure: CORONARY ARTERY BYPASS GRAFTING (CABG);  Surgeon: Delight Ovens, MD;  Location: [facility] OR;  Service: Open Heart Surgery;  Laterality: N/A;  times three using  Left Internal Mammary Artery and Right Greater Saphenous Vein Graft harvested endoscopically; TEE  . Endarterectomy  10/31/2011    Procedure: ENDARTERECTOMY CAROTID;  Surgeon: Nada LibmanVance W Brabham, MD;  [facility];  Service: Vascular;  Laterality: Right;  with patch angioplasty   . Carotid endarterectomy  10-31-2011    right  . Colonoscopy N/A 10/30/2012    Procedure: COLONOSCOPY;  Surgeon: Shirley FriarVincent C. Schooler, MD;  [facility];  Service: Endoscopy;  Laterality: N/A;  . Left heart catheterization with coronary angiogram N/A 09/04/2011    Procedure: LEFT HEART CATHETERIZATION WITH CORONARY ANGIOGRAM;  Surgeon: Lesleigh NoeHenry W Smith III, MD;  [facility];  Service: Cardiovascular;  Laterality: N/A;  .  Left heart cath N/A 04/03/2012    Procedure: LEFT HEART CATH;  Surgeon: Lennette Biharihomas A Kelly, MD;  [facility];  Service: Cardiovascular;  Laterality: N/A;  . Pelvic fracture surgery     Social History:  reports that he quit smoking about 2 months ago. His smoking use included Cigarettes. He has a 64 pack-year smoking history. He has never used smokeless tobacco. He reports that he drinks about 3.6 oz of alcohol per week. He reports that he does not use illicit drugs.  No Known Allergies  Family History  Problem Relation Age of Onset  . Cancer Mother   . Tuberculosis Mother   . Heart disease Father     He did NOT have it before age 74     Prior to Admission medications   Medication Sig Start Date End Date Taking? Authorizing Provider  albuterol (PROVENTIL) (2.5 MG/3ML) 0.083% nebulizer solution Take 2.5 mg by nebulization 3 (three) times daily. And Q6H PRN   Yes Historical Provider, MD  atorvastatin (LIPITOR) 10 MG tablet Take 1 tablet (10 mg total) by mouth daily at 6 PM. 01/05/14  Yes Jeralyn BennettEzequiel Zamora, MD  bisacodyl (DULCOLAX) 5 MG EC tablet Take 5 mg by mouth daily as needed for mild constipation or moderate constipation.   Yes Historical Provider, MD  budesonide-formoterol (SYMBICORT) 160-4.5 MCG/ACT inhaler Inhale 2 puffs into the lungs 2 (two) times daily.   Yes Historical Provider, MD  buPROPion (WELLBUTRIN XL) 300 MG 24 hr tablet Take 300 mg by mouth daily.   Yes Historical Provider, MD  carvedilol (COREG) 3.125 MG tablet Take 1 tablet (3.125 mg total) by mouth 2 (two) times daily with a meal. 07/10/14  Yes Osvaldo ShipperGokul Krishnan, MD  colchicine 0.6 MG tablet Take 0.6 mg by mouth daily.   Yes Historical Provider, MD  furosemide (LASIX) 40 MG tablet Take 1 tablet (40 mg total) by mouth daily. 06/20/14  Yes Lyn RecordsHenry W Smith, MD  mirtazapine (REMERON) 7.5 MG tablet Take 7.5 mg by mouth at bedtime.   Yes Historical Provider, MD  nicotine (NICODERM CQ - DOSED IN MG/24 HOURS) 21 mg/24hr patch Place  1 patch (21 mg total) onto the skin daily. 07/10/14  Yes Osvaldo ShipperGokul Krishnan, MD  oxyCODONE (OXY IR/ROXICODONE) 5 MG immediate release tablet Take 1 tablet (5 mg total) by mouth every 6 (six) hours as needed for severe pain. 08/03/14  Yes Sharon SellerJessica K Eubanks, NP  pantoprazole (PROTONIX) 40 MG tablet Take 1 tablet (40 mg total) by mouth daily. 07/10/14  Yes Osvaldo ShipperGokul Krishnan, MD  polyethylene glycol (MIRALAX / GLYCOLAX) packet Take 17 g by mouth daily. 07/10/14  Yes Osvaldo ShipperGokul Krishnan, MD  potassium chloride 20 MEQ TBCR Take 20 mEq by mouth 2 (two) times daily. 07/10/14  Yes Osvaldo ShipperGokul Krishnan, MD  warfarin (COUMADIN) 4 MG tablet Take 4 mg by mouth daily.   Yes Historical Provider, MD  acetaminophen (TYLENOL) 325 MG tablet Take 1 tablet (  325 mg total) by mouth every 6 (six) hours. 07/10/14   Tiffany Neva SeatGreene, PA-C  docusate sodium (COLACE) 100 MG capsule Take 1 capsule (100 mg total) by mouth 2 (two) times daily. Patient not taking: Reported on 09/22/2014 07/10/14   Osvaldo ShipperGokul Krishnan, MD  feeding supplement, ENSURE COMPLETE, (ENSURE COMPLETE) LIQD Take 237 mLs by mouth 2 (two) times daily between meals. Patient not taking: Reported on 09/14/2014 07/10/14   Osvaldo ShipperGokul Krishnan, MD  guaiFENesin (MUCINEX) 600 MG 12 hr tablet Take 600 mg by mouth 2 (two) times daily as needed for cough.     Historical Provider, MD  ipratropium-albuterol (DUONEB) 0.5-2.5 (3) MG/3ML SOLN Take 3 mLs by nebulization every 4 (four) hours as needed. For shortness of breath    Historical Provider, MD  nitroGLYCERIN (NITROSTAT) 0.4 MG SL tablet Place 0.4 mg under the tongue every 5 (five) minutes as needed for chest pain.    Historical Provider, MD  saccharomyces boulardii (FLORASTOR) 250 MG capsule Take 1 capsule (250 mg total) by mouth 2 (two) times daily. Patient not taking: Reported on 09/11/2014 07/10/14   Osvaldo ShipperGokul Krishnan, MD  sennosides (SENOKOT) 8.8 MG/5ML syrup Take 5 mLs by mouth 2 (two) times daily. Patient not taking: Reported on 10/09/2014 07/10/14   Osvaldo ShipperGokul  Krishnan, MD  tiotropium (SPIRIVA) 18 MCG inhalation capsule Place 1 capsule (18 mcg total) into inhaler and inhale daily. Patient not taking: Reported on 09/24/2014 06/18/14   Rhetta MuraJai-Gurmukh Samtani, MD   Physical Exam: Filed Vitals:   09/19/14 0030 09/19/14 0100 09/19/14 0130 09/19/14 0200  BP: 114/75 110/74 107/73 118/78  Pulse: 117 123 132 106  Temp:      TempSrc:      Resp: 18 23 22 20   Weight:      SpO2: 100% 100% 100% 100%    Wt Readings from Last 3 Encounters:  10/01/2014 73.5 kg (162 lb 0.6 oz)  08/23/14 73.483 kg (162 lb)  08/14/14 70.943 kg (156 lb 6.4 oz)    General:  Appears calm and comfortable Eyes:  PERRL, normal lids, irises & conjunctiva ENT: Dry Mucus membranes Neck:  no LAD, masses or thyromegaly Cardiovascular:  RRR, no m/r/g. 2+ LE pitting edema (baseline) Telemetry:  SR, no arrhythmias  Respiratory: decreased breath sounds in the bases w/ few crackles. Normal respiratory effort. Abdomen: mild intermittent ttp, NABS, nondistended Skin:  no rash or induration seen on limited exam Musculoskeletal:  grossly normal tone BUE/BLE Psychiatric:  grossly normal mood and affect, speech fluent and appropriate Neurologic:  grossly non-focal.          Labs on Admission:  Basic Metabolic Panel:  Recent Labs Lab 10/10/2014 2159  NA 133*  K 4.9  CL 95*  CO2 23  GLUCOSE 89  BUN 59*  CREATININE 2.90*  CALCIUM 8.8*   Liver Function Tests:  Recent Labs Lab 10/10/2014 2159  AST 143*  ALT 153*  ALKPHOS 143*  BILITOT 2.0*  PROT 7.4  ALBUMIN 2.9*   No results for input(s): LIPASE, AMYLASE in the last 168 hours. No results for input(s): AMMONIA in the last 168 hours. CBC:  Recent Labs Lab 09/28/2014 2159  WBC 11.7*  NEUTROABS 9.5*  HGB 10.5*  HCT 36.3*  MCV 84.4  PLT 244   Cardiac Enzymes: No results for input(s): CKTOTAL, CKMB, CKMBINDEX, TROPONINI in the last 168 hours.  BNP (last 3 results)  Recent Labs  06/26/14 0357 07/02/14 0410 09/16/2014 2159   BNP 626.8* 703.3* 1350.8*    ProBNP (last 3  results)  Recent Labs  01/04/14 0450 04/21/14 1504  PROBNP 3786.0* 13092.0*    CBG: No results for input(s): GLUCAP in the last 168 hours.  Radiological Exams on Admission: Ct Abdomen Pelvis Wo Contrast  10/09/2014   CLINICAL DATA:  Constipation for 5 days and bilateral leg swelling for 3 weeks. History of COPD and PE. No IV or oral contrast material per order of the referring physician.  EXAM: CT ABDOMEN AND PELVIS WITHOUT CONTRAST  TECHNIQUE: Multidetector CT imaging of the abdomen and pelvis was performed following the standard protocol without IV contrast.  COMPARISON:  None.  FINDINGS: Small bilateral pleural effusions, greater on the right. Basilar atelectasis. Consolidation in the right lower lung posteriorly probably represents pneumonia. Postoperative changes in the mediastinum. Mild cardiac enlargement.  Small amount of free fluid in the abdomen and extending along the pericolic gutters into the pelvis. This is likely ascites. Increased density in the gallbladder may represent sludge or milk of calcium. No bile duct dilatation. Unenhanced appearance of the liver, spleen, pancreas, adrenal glands, inferior vena cava, and retroperitoneal lymph nodes is unremarkable. Calcifications in both kidneys probably representing vascular calcification. No hydronephrosis or hydroureter. Calcification throughout the abdominal aorta without aneurysm. Stenosis at the aortic bifurcation is probable. Vascular stents in the iliac arteries. Stomach, small bowel, and colon are not abnormally distended. Stool fills the colon. No free air in the abdomen.  Pelvis: Stool-filled rectosigmoid colon. There is some stranding around the sigmoid region which may indicate early changes of acute diverticulitis. No abscess. Bladder wall is not thickened. Prostate gland is not enlarged. Appendix is normal. Small left inguinal hernia containing fat and fluid. Degenerative changes  in the spine and hips. Old fracture deformity of the right inferior pubic ramus.  IMPRESSION: Small bilateral pleural effusions greater on the right. Consolidation in the right lower lung suggesting pneumonia. Small amount of abdominal and pelvic free fluid. Increased density suggesting sludge versus milk of calcium in the gallbladder. Mild stranding around the sigmoid colon suggesting early diverticulitis. No abscess. Left inguinal hernia containing fat and fluid.   Electronically Signed   By: Burman Nieves M.D.   On: 09/20/2014 23:34   Dg Abd Acute W/chest  10/04/2014   CLINICAL DATA:  Constipation for 5 days. Bilateral leg swelling for 3 weeks. Abdominal pain. History of COPD and PE.  EXAM: DG ABDOMEN ACUTE W/ 1V CHEST  COMPARISON:  Chest 07/10/2014.  Abdomen 07/04/2014.  FINDINGS: Postoperative changes in the mediastinum. Mild cardiac enlargement without vascular congestion. Small bilateral pleural effusions, greater on the right. Bilateral basilar atelectasis. Emphysematous changes in the lungs. No pneumothorax. Calcification of the aorta.  Interval resolution of previous colonic ileus. Diffusely stool-filled colon. No small or large bowel distention. No free intra-abdominal air. No abnormal air-fluid levels. Vascular stents in vascular calcifications present. Degenerative changes in the spine and hips.  IMPRESSION: Cardiac enlargement. Bilateral pleural effusions and basilar atelectasis, greater on the right. Normal nonobstructive bowel gas pattern.   Electronically Signed   By: Burman Nieves M.D.   On: 09/12/2014 22:34   Assessment/Plan Principal Problem:   Sepsis Active Problems:   HCAP (healthcare-associated pneumonia)   Colitis   Constipation   AKI (acute kidney injury)   CKD (chronic kidney disease)   Acute systolic congestive heart failure   HLD (hyperlipidemia)   GERD without esophagitis   Sepsis: likely secondary to HCAP/colitis - CT suggestive of bilateral pneumonia and  colitis. WBC 11.7. Lactic acid 4.88. Tachycardic and tachypnea, hypotensive. IV  bolus in ED.  - Stepdown - Vancomycin and Zosyn - BCX - IVF - Sputum cultures, blood cultures - Legionella and strep antigen test  Colitis: Noted on CT. Lactic acid 4.88 as mentioned above. No bowel movement in 4 days. - Zosyn - NPO - IVF - MiraLAX, senna  Acute on chronic kidney disease: Currently 2.9. Baseline 1.2. - IVF - Avoid nephrotoxic medications as much as possible  PAF: INR 9. No active signs of bleeding. Rate Controlled - Stool Hemoccult - Continue carvedilol - Hold Coumadin - dose per pharmacy  Transaminitis: AST 143, ALT 153. Also of note alkaline phosphatase 143. Likely secondary to acute illness but cannot rule out chronic pathology. - Trend CMET and initiate further workup if needed.  Acute systolic congestive heart failure: BNP 1350. Last echo showing EF 40%. Some elevation in BNP likely secondary to acute on chronic renal insufficiency. - Consider Lasix 40 IV once BP improves from Sepsis - Continue beta blocker  HLD - Continue statin  GERD: - Continue PPI  Depression: - Continue Wellbutrin and Remeron  Code Status: FULL DVT Prophylaxis: on warfarin Family Communication: Wife Disposition Plan: Pending improvement  MERRELL, DAVID Shela Commons, MD Family Medicine [facility] [email or web address] Password [email or web address]

## 2014-09-20 LAB — PROTIME-INR
INR: >10 (ref 0.00–1.49)
Prothrombin Time: >90 seconds — ABNORMAL HIGH (ref 11.6–15.2)

## 2014-09-20 LAB — LEGIONELLA ANTIGEN, URINE

## 2014-09-20 LAB — BASIC METABOLIC PANEL
Anion gap: 19 — ABNORMAL HIGH (ref 5–15)
Anion gap: 13 (ref 5–15)
BUN: 68 mg/dL — ABNORMAL HIGH (ref 6–20)
BUN: 73 mg/dL — ABNORMAL HIGH (ref 6–20)
CO2: 18 mmol/L — ABNORMAL LOW (ref 22–32)
CO2: 23 mmol/L (ref 22–32)
Calcium: 8.6 mg/dL — ABNORMAL LOW (ref 8.9–10.3)
Calcium: 8.7 mg/dL — ABNORMAL LOW (ref 8.9–10.3)
Chloride: 99 mmol/L — ABNORMAL LOW (ref 101–111)
Chloride: 100 mmol/L — ABNORMAL LOW (ref 101–111)
Creatinine, Ser: 3.63 mg/dL — ABNORMAL HIGH (ref 0.61–1.24)
Creatinine, Ser: 3.79 mg/dL — ABNORMAL HIGH (ref 0.61–1.24)
GFR calc Af Amer: 17 mL/min — ABNORMAL LOW (ref >60)
GFR calc non Af Amer: 15 mL/min — ABNORMAL LOW (ref >60)
GFR calc non Af Amer: 14 mL/min — ABNORMAL LOW (ref >60)
GFR, EST AFRICAN AMERICAN: 17 mL/min — AB (ref >60)
Glucose, Bld: 47 mg/dL — ABNORMAL LOW (ref 70–99)
Glucose, Bld: 116 mg/dL — ABNORMAL HIGH (ref 70–99)
POTASSIUM: 6.6 mmol/L — AB (ref 3.5–5.1)
Potassium: 5.9 mmol/L — ABNORMAL HIGH (ref 3.5–5.1)
Sodium: 136 mmol/L (ref 135–145)
Sodium: 136 mmol/L (ref 135–145)

## 2014-09-20 LAB — BLOOD GAS, ARTERIAL
Acid-base deficit: 9.1 mmol/L — ABNORMAL HIGH (ref 0.0–2.0)
Bicarbonate: 16.2 mEq/L — ABNORMAL LOW (ref 20.0–24.0)
Drawn by: 31814
FIO2: 0.4 %
O2 Saturation: 96.7 %
Patient temperature: 96.5
TCO2: 15.3 mmol/L (ref 0–100)
pCO2 arterial: 33 mmHg — ABNORMAL LOW (ref 35.0–45.0)
pH, Arterial: 7.304 — ABNORMAL LOW (ref 7.350–7.450)
pO2, Arterial: 99.9 mmHg (ref 80.0–100.0)

## 2014-09-20 LAB — LACTIC ACID, PLASMA: Lactic Acid, Venous: 3.4 mmol/L (ref 0.5–2.0)

## 2014-09-20 LAB — GLUCOSE, CAPILLARY
GLUCOSE-CAPILLARY: 144 mg/dL — AB (ref 70–99)
Glucose-Capillary: 51 mg/dL — ABNORMAL LOW (ref 70–99)
Glucose-Capillary: 113 mg/dL — ABNORMAL HIGH (ref 70–99)
Glucose-Capillary: 138 mg/dL — ABNORMAL HIGH (ref 70–99)
Glucose-Capillary: 132 mg/dL — ABNORMAL HIGH (ref 70–99)
Glucose-Capillary: 98 mg/dL (ref 70–99)

## 2014-09-20 LAB — URINE CULTURE
COLONY COUNT: NO GROWTH
CULTURE: NO GROWTH

## 2014-09-20 LAB — CBC
HEMATOCRIT: 36.1 % — AB (ref 39.0–52.0)
Hemoglobin: 10.1 g/dL — ABNORMAL LOW (ref 13.0–17.0)
MCH: 23.8 pg — ABNORMAL LOW (ref 26.0–34.0)
MCHC: 28 g/dL — ABNORMAL LOW (ref 30.0–36.0)
MCV: 85.1 fL (ref 78.0–100.0)
PLATELETS: 226 10*3/uL (ref 150–400)
RBC: 4.24 MIL/uL (ref 4.22–5.81)
RDW: 20 % — AB (ref 11.5–15.5)
WBC: 22.9 10*3/uL — ABNORMAL HIGH (ref 4.0–10.5)

## 2014-09-20 LAB — CLOSTRIDIUM DIFFICILE BY PCR: Toxigenic C. Difficile by PCR: NEGATIVE

## 2014-09-20 MED ORDER — SODIUM CHLORIDE 0.9 % IV BOLUS (SEPSIS)
250.0000 mL | Freq: Once | INTRAVENOUS | Status: AC
  Administered 2014-09-20: 250 mL via INTRAVENOUS

## 2014-09-20 MED ORDER — METRONIDAZOLE IN NACL 5-0.79 MG/ML-% IV SOLN
500.0000 mg | Freq: Three times a day (TID) | INTRAVENOUS | Status: DC
  Administered 2014-09-20 – 2014-09-21 (×3): 500 mg via INTRAVENOUS
  Filled 2014-09-20 (×3): qty 100

## 2014-09-20 MED ORDER — SODIUM CHLORIDE 0.9 % IV SOLN
INTRAVENOUS | Status: DC
  Administered 2014-09-20 – 2014-09-21 (×5): via INTRAVENOUS
  Administered 2014-09-22: 125 mL/h via INTRAVENOUS
  Administered 2014-09-24: 50 mL/h via INTRAVENOUS
  Administered 2014-09-27: 06:00:00 via INTRAVENOUS

## 2014-09-20 MED ORDER — DEXTROSE-NACL 5-0.45 % IV SOLN
INTRAVENOUS | Status: DC
  Administered 2014-09-20: 05:00:00 via INTRAVENOUS

## 2014-09-20 MED ORDER — VITAMIN K1 1 MG/0.5ML IJ SOLN
1.0000 mg | Freq: Once | INTRAMUSCULAR | Status: AC
  Administered 2014-09-20: 1 mg via SUBCUTANEOUS
  Filled 2014-09-20: qty 0.5

## 2014-09-20 MED ORDER — SODIUM CHLORIDE 0.9 % IV SOLN
1.0000 g | Freq: Once | INTRAVENOUS | Status: AC
  Administered 2014-09-20: 1 g via INTRAVENOUS

## 2014-09-20 MED ORDER — FLEET ENEMA 7-19 GM/118ML RE ENEM
1.0000 | ENEMA | Freq: Once | RECTAL | Status: AC
  Administered 2014-09-20: 1 via RECTAL
  Filled 2014-09-20: qty 1

## 2014-09-20 MED ORDER — CALCIUM GLUCONATE 10 % IV SOLN
INTRAVENOUS | Status: AC
  Administered 2014-09-20: 06:00:00
  Filled 2014-09-20: qty 10

## 2014-09-20 MED ORDER — COLCHICINE 0.6 MG PO TABS
0.3000 mg | ORAL_TABLET | Freq: Every day | ORAL | Status: DC
  Administered 2014-09-20 – 2014-09-21 (×2): 0.3 mg via ORAL
  Filled 2014-09-20 (×3): qty 0.5

## 2014-09-20 MED ORDER — MORPHINE SULFATE 2 MG/ML IJ SOLN: 1.0000 mg | Freq: Once | INTRAMUSCULAR | Status: DC

## 2014-09-20 MED ORDER — PIPERACILLIN-TAZOBACTAM IN DEX 2-0.25 GM/50ML IV SOLN
2.2500 g | Freq: Four times a day (QID) | INTRAVENOUS | Status: AC
  Administered 2014-09-20 – 2014-09-23 (×14): 2.25 g via INTRAVENOUS
  Filled 2014-09-20 (×16): qty 50

## 2014-09-20 MED ORDER — VANCOMYCIN 50 MG/ML ORAL SOLUTION
500.0000 mg | Freq: Four times a day (QID) | ORAL | Status: DC
  Administered 2014-09-20 – 2014-09-21 (×4): 500 mg via ORAL
  Filled 2014-09-20 (×7): qty 10

## 2014-09-20 MED ORDER — INSULIN ASPART 100 UNIT/ML ~~LOC~~ SOLN
10.0000 [IU] | Freq: Once | SUBCUTANEOUS | Status: AC
  Administered 2014-09-20: 10 [IU] via SUBCUTANEOUS

## 2014-09-20 MED ORDER — VITAMIN K1 10 MG/ML IJ SOLN
10.0000 mg | Freq: Once | INTRAMUSCULAR | Status: AC
  Administered 2014-09-20: 10 mg via SUBCUTANEOUS
  Filled 2014-09-20: qty 1

## 2014-09-20 MED ORDER — MORPHINE SULFATE 2 MG/ML IJ SOLN: 2.0000 mg | Freq: Once | INTRAMUSCULAR | Status: DC

## 2014-09-20 MED ORDER — DEXTROSE 50 % IV SOLN
1.0000 | Freq: Once | INTRAVENOUS | Status: AC
  Administered 2014-09-20: 50 mL via INTRAVENOUS
  Filled 2014-09-20: qty 50

## 2014-09-20 MED ORDER — DEXTROSE 50 % IV SOLN
INTRAVENOUS | Status: AC
  Administered 2014-09-20: 05:00:00
  Filled 2014-09-20: qty 50

## 2014-09-20 NOTE — Progress Notes (Signed)
CRITICAL VALUE ALERT  Critical value received:  INR >10  Date of notification:  09/20/2014  Time of notification:  0440  Critical value read back yes  Nurse who received alert:  Jairo BenMichelle Halen Mossbarger RN  MD notified (1st page):  K.Schorr NP  Time of first page:  [phone]  MD notified (2nd page):none  Time of second page:none  Responding MD:  Tama GanderKatherine Schorr NP  Time MD responded:  0500

## 2014-09-20 NOTE — Progress Notes (Signed)
Pharmacy: Re- vancomycin, zosyn, and coumadin  Patient's a 74 y.o M presented to the ED on 5/9 with c/o constipation and emesis.  He was on coumadin PTA for afib with INR supratherapeutic at 9 on admit.  High INR is likely a result of poor oral intake. s/p vitamin K 2.5mg  PO on 5/10 and 1mg  SQ on  5/11 with INR remaining supratherapeutic at >10 today. Hemeoccult ( -), cbc remains stable, no bleeding documented.  He's also on vancomycin and zosyn for PNA.  Scr trending up to 3.63 today (crcl~20).  Wbc elevated at 22.9, Tmax 98.7  5/9 >>cefepime  >>5/9 5/9 >>vancomycin  >>   5/10 >> zosyn >>  5/10 bcx x2: ngtd 5/10 ucx: sent 5/10 MRSA PCR ( -)  5/9 LA 4.88 5/9 CT: small BL pleural effusion with consolidation consistent with RLL PNA  Plan: - reduce  change zosyn to 2.25 gm IV q6h - d/c standing vanc, and check random level at 0100 on 5/12 to r/o accumulation prior to giving the next dose  - cont to hold coumadin - watch renal function - f/u INR and resume coumadin if/when appropriate - With IV vit K dose given this morning, consider checking another INR later today to determine whether additional vit K is needed  [name], PharmD, BCPS 09/20/2014 8:14 AM

## 2014-09-20 NOTE — Progress Notes (Signed)
Patient Update:  Patient has been less alert and less verbal throughout the night.  Respiratory rate had been as high as 40-50s and shallow, but later this am WNL.  Pt appeared more confused during the night, but still will follow commands.  This am BMET showed that the blood glucose was 47 and the Potassium was 6.6.  Hypoglycemic protocol was followed and NP was made aware and orders were received.  Pt is currently more alert after dextrose.  He still has not voided during the night and [facility] is aware.  Bladder scan showed 200-400cc.  Vit K given for INR>10.  BMP F/U ordered. Will continue to monitor.

## 2014-09-20 NOTE — Progress Notes (Signed)
PROGRESS NOTE  Nathan [name] NWG:[id number] DOB: [birth year] DOA: 09/23/2014 PCP: Lillia Mountain, MD  HPI/Recap of past 55 hours: 74 year old male with history of systolic/diastolic heart failure, CAD status post CABG, atrial flutter and PE on chronic anticoagulation admitted on 5/9 for increased weakness after not having bowel movement times several days and found to have sepsis felt to be secondary to bilateral pneumonia and colitis seen on abdominal CT. Patient also noted to have severe coagulopathy with INR greater than 9, acute renal failure and hypotension.  Patient initially had some improvement on 5/10, but then by 5/11 is continued to suffer from hypotension, worsening leukocytosis, lactic acid level and renal function. He himself has no complaints and states he is feeling okay. His received a significant amount of fluid boluses. Potassium this morning noted at 6.0  Assessment/Plan: Principal Problem:   Sepsis secondary to healthcare associated pneumonia and colitis: Continued on broad-spectrum antibiotics, despite this, white blood cell count significantly increased to 22. Patient was criteria given hypotension, tachycardia, leukocytosis, lactic acid level and pulmonary/GI source.  Have strong suspicion this is C. difficile colitis. Have been unable to get a bowel movement.  We'll try aggressive laxative use, manual disimpaction and treating prophylactically with by mouth vancomycin. Continue aggressive IV fluids Active Problems: \   Constipation: See above   AKI (acute kidney injury) in the setting of stage II chronic kidney disease: Secondary to sepsis. Worsening secondary to persistent hypotension :   Acute systolic congestive heart failure: Unable to diurese given hypotension. Patient already showing signs of third spacing and coagulopathy almost certainly due to hepatic congestion from heart failure. We'll try to treat coagulopathy with aggressive subcutaneous vitamin K and  once he is therapeutic enough, safe to put in a PICC line versus central line and then consider pressor support plus aggressive diuresis   HLD (hyperlipidemia): Holding statin secondary to increased liver function tests   GERD without esophagitis   Code Status: Full code  Family Communication: Wife at the bedside  Disposition Plan: Upgrade to ICU status   Consultants:  None  Procedures:  None  Antibiotics:  IV vancomycin 5/9-present  IV Zosyn 5/9-present  By mouth vancomycin 5/11-present  IV Flagyl 5/11-present   Objective: BP 87/56 mmHg  Pulse 48  Temp(Src) 97.6 F (36.4 C) (Oral)  Resp 13  Ht  (1.854 m)  Wt 85.3 kg (188 lb 0.8 oz)  BMI 24.82 kg/m2  SpO2 82%  Intake/Output Summary (Last 24 hours) at 09/20/14 1443 Last data filed at 09/20/14 0800  Gross per 24 hour  Intake 446.67 ml  Output     60 ml  Net 386.67 ml   Filed Weights   09/26/2014 2358 09/19/14 1318  Weight: 73.5 kg (162 lb 0.6 oz) 85.3 kg (188 lb 0.8 oz)    Exam:   General:  Alert and oriented 2, no acute distress  Cardiovascular: Regular rate and rhythm, S1-S2  Respiratory: Decreased breath sounds bibasilar  Abdomen: Soft, distended, hypoactive bowel sounds, nontender  Musculoskeletal: 2-3 plus pitting edema from the knees down bilaterally   Data Reviewed: Basic Metabolic Panel:  Recent Labs Lab 09/15/2014 2159 09/19/14 0410 09/20/14 0335 09/20/14 0930  NA 133* 133* 136 136  K 4.9 5.0 6.6* 5.9*  CL 95* 98* 99* 100*  CO2 23 23 18* 23  GLUCOSE 89 109* 47* 116*  BUN 59* 57* 68* 73*  CREATININE 2.90* 2.88* 3.63* 3.79*  CALCIUM 8.8* 8.3* 8.6* 8.7*   Liver Function Tests:  Recent Labs Lab 10/10/2014 2159 09/19/14 0410  AST 143* 127*  ALT 153* 145*  ALKPHOS 143* 122  BILITOT 2.0* 1.8*  PROT 7.4 6.4*  ALBUMIN 2.9* 2.5*   No results for input(s): LIPASE, AMYLASE in the last 168 hours. No results for input(s): AMMONIA in the last 168 hours. CBC:  Recent  Labs Lab 10/09/2014 2159 09/19/14 0410 09/20/14 0335  WBC 11.7* 10.8* 22.9*  NEUTROABS 9.5*  --   --   HGB 10.5* 9.7* 10.1*  HCT 36.3* 33.4* 36.1*  MCV 84.4 84.1 85.1  PLT 244 201 226   Cardiac Enzymes:   No results for input(s): CKTOTAL, CKMB, CKMBINDEX, TROPONINI in the last 168 hours. BNP (last 3 results)  Recent Labs  06/26/14 0357 07/02/14 0410 09/13/2014 2159  BNP 626.8* 703.3* 1350.8*    ProBNP (last 3 results)  Recent Labs  01/04/14 0450 04/21/14 1504  PROBNP 3786.0* 13092.0*    CBG:  Recent Labs Lab 09/20/14 0452 09/20/14 0542 09/20/14 0618 09/20/14 0700 09/20/14 1135  GLUCAP 144* 113* 138* 132* 98    Recent Results (from the past 240 hour(s))  Blood Culture (routine x 2)     Status: None (Preliminary result)   Collection Time: 09/19/14 12:15 AM  Result Value Ref Range Status   Specimen Description BLOOD RIGHT ANTECUBITAL  Final   Special Requests BOTTLES DRAWN AEROBIC AND ANAEROBIC 5ML  Final   Culture   Final           BLOOD CULTURE RECEIVED NO GROWTH TO DATE CULTURE WILL BE HELD FOR 5 DAYS BEFORE ISSUING A FINAL NEGATIVE REPORT Performed at [facility]    Report Status PENDING  Incomplete  Blood Culture (routine x 2)     Status: None (Preliminary result)   Collection Time: 09/19/14 12:16 AM  Result Value Ref Range Status   Specimen Description BLOOD L FOREARM  Final   Special Requests BOTTLES DRAWN AEROBIC AND ANAEROBIC 5ML  Final   Culture   Final           BLOOD CULTURE RECEIVED NO GROWTH TO DATE CULTURE WILL BE HELD FOR 5 DAYS BEFORE ISSUING A FINAL NEGATIVE REPORT Performed at [facility]    Report Status PENDING  Incomplete  Urine culture     Status: None   Collection Time: 09/19/14  6:52 AM  Result Value Ref Range Status   Specimen Description URINE, RANDOM  Final   Special Requests NONE  Final   Colony Count NO GROWTH Performed at [facility]   Final   Culture NO GROWTH [facility]formed at [facility]   Final   Report Status 09/20/2014 FINAL  Final  MRSA PCR Screening     Status: None   Collection Time: 09/19/14  1:30 PM  Result Value Ref Range Status   MRSA by PCR NEGATIVE NEGATIVE Final    Comment:        The GeneXpert MRSA Assay (FDA approved for NASAL specimens only), is one component of a comprehensive MRSA colonization surveillance program. It is not intended to diagnose MRSA infection nor to guide or monitor treatment for MRSA infections.      Studies: No results found.  Scheduled Meds: . bisacodyl  10 mg Oral Once  . budesonide-formoterol  2 puff Inhalation BID  . buPROPion  300 mg Oral Daily  . carvedilol  3.125 mg Oral BID WC  . colchicine  0.3 mg Oral Daily  . docusate sodium  100 mg Oral  BID  . vancomycin  500 mg Oral 4 times per day   And  . metronidazole  500 mg Intravenous Q8H  . mirtazapine  7.5 mg Oral QHS  .  morphine injection  1 mg Intravenous Once  . pantoprazole  40 mg Oral Daily  . phytonadione  10 mg Subcutaneous Once  . piperacillin-tazobactam (ZOSYN)  IV  2.25 g Intravenous Q6H  . polyethylene glycol  17 g Oral Daily  . senna  1 tablet Oral Daily  . sodium chloride  3 mL Intravenous Q12H  . tiotropium  18 mcg Inhalation Daily    Continuous Infusions: . sodium chloride 125 mL/hr at 09/20/14 1137     Time spent: 50 minutes  Hollice EspyKRISHNAN,Jaria Conway K  [facility] Pager [phone]. If 7PM-7AM, please contact night-coverage at [email or web address], password [email or web address] 09/20/2014, 2:43 PM  LOS: 1 day

## 2014-09-20 NOTE — Progress Notes (Signed)
Date: Sep 20, 2014 Chart reviewed for concurrent status and case management needs. Vitalia Stough, RN, BSN, CCM   [phone] 

## 2014-09-20 NOTE — Progress Notes (Signed)
Given enema with little result

## 2014-09-20 NOTE — Progress Notes (Signed)
CRITICAL VALUE ALERT  Critical value received: Potassium 6.6  Date of notification:  09/20/2014  Time of notification:  0425  Critical value read back yes  Nurse who received alert:  Jairo BenMichelle Artis Buechele RN  MD notified (1st page):  Merdis DelayK. Schorr NP  Time of first page:  [phone]  MD notified (2nd page):none  Time of second pagenone:  Responding MD:  Schorr NP  Time MD responded:  [phone]

## 2014-09-20 NOTE — Progress Notes (Signed)
Hypoglycemic Event  CBG: 51 (BMET Glucose 47)  Treatment: 1 Amp D50  Symptoms Less Alert, agitated  Follow-up CBG: ZOXW:[id number]:[id number] CBG Result:144  Possible Reasons for Event: Minimal Po intake  Comments/MD notified:yes    Nathan [name], Nathan [name]  Remember to initiate Hypoglycemia Order Set & complete

## 2014-09-21 LAB — CBC
HCT: 36.4 % — ABNORMAL LOW (ref 39.0–52.0)
Hemoglobin: 10.5 g/dL — ABNORMAL LOW (ref 13.0–17.0)
MCH: 24.2 pg — AB (ref 26.0–34.0)
MCHC: 28.8 g/dL — ABNORMAL LOW (ref 30.0–36.0)
MCV: 84.1 fL (ref 78.0–100.0)
Platelets: 168 10*3/uL (ref 150–400)
RBC: 4.33 MIL/uL (ref 4.22–5.81)
RDW: 20.2 % — AB (ref 11.5–15.5)
WBC: 14.8 10*3/uL — ABNORMAL HIGH (ref 4.0–10.5)

## 2014-09-21 LAB — BASIC METABOLIC PANEL
Anion gap: 16 — ABNORMAL HIGH (ref 5–15)
BUN: 76 mg/dL — ABNORMAL HIGH (ref 6–20)
CO2: 21 mmol/L — ABNORMAL LOW (ref 22–32)
Calcium: 8.2 mg/dL — ABNORMAL LOW (ref 8.9–10.3)
Chloride: 100 mmol/L — ABNORMAL LOW (ref 101–111)
Creatinine, Ser: 3.85 mg/dL — ABNORMAL HIGH (ref 0.61–1.24)
GFR calc Af Amer: 16 mL/min — ABNORMAL LOW (ref >60)
GFR calc non Af Amer: 14 mL/min — ABNORMAL LOW (ref >60)
Glucose, Bld: 72 mg/dL (ref 65–99)
Potassium: 5.6 mmol/L — ABNORMAL HIGH (ref 3.5–5.1)
Sodium: 137 mmol/L (ref 135–145)

## 2014-09-21 LAB — PROTIME-INR
INR: >10 (ref 0.00–1.49)
INR: 5.66 (ref 0.00–1.49)
PROTHROMBIN TIME: 51.5 s — AB (ref 11.6–15.2)
Prothrombin Time: >90 seconds — ABNORMAL HIGH (ref 11.6–15.2)

## 2014-09-21 LAB — LACTIC ACID, PLASMA: Lactic Acid, Venous: 2.5 mmol/L (ref 0.5–2.0)

## 2014-09-21 LAB — VANCOMYCIN, RANDOM: VANCOMYCIN RM: 16 ug/mL

## 2014-09-21 MED ORDER — LORAZEPAM 2 MG/ML IJ SOLN
0.5000 mg | Freq: Once | INTRAMUSCULAR | Status: AC
  Administered 2014-09-21: 0.5 mg via INTRAVENOUS
  Filled 2014-09-21: qty 1

## 2014-09-21 MED ORDER — VITAMIN K1 10 MG/ML IJ SOLN: 10.0000 mg | Freq: Once | INTRAMUSCULAR | Status: DC

## 2014-09-21 MED ORDER — VANCOMYCIN HCL IN DEXTROSE 1-5 GM/200ML-% IV SOLN
1000.0000 mg | Freq: Once | INTRAVENOUS | Status: AC
  Administered 2014-09-21: 1000 mg via INTRAVENOUS
  Filled 2014-09-21: qty 200

## 2014-09-21 MED ORDER — PEG 3350-KCL-NA BICARB-NACL 420 G PO SOLR
4000.0000 mL | Freq: Once | ORAL | Status: DC
  Filled 2014-09-21: qty 4000

## 2014-09-21 MED ORDER — DEXTROSE 5 % IV SOLN
10.0000 mg | Freq: Once | INTRAVENOUS | Status: AC
  Administered 2014-09-21: 10 mg via INTRAVENOUS
  Filled 2014-09-21: qty 1

## 2014-09-21 MED ORDER — PHYTONADIONE 5 MG PO TABS
5.0000 mg | ORAL_TABLET | Freq: Once | ORAL | Status: AC
  Administered 2014-09-21: 5 mg via ORAL
  Filled 2014-09-21: qty 1

## 2014-09-21 NOTE — Progress Notes (Signed)
ANTICOAGULATION CONSULT NOTE - Follow Up Consult  Pharmacy Consult for coumadin Indication: hx atrial fibrillation  No Known Allergies  Patient Measurements: Height: 5\' 8"  (172.7 cm) Weight: 188 lb 0.8 oz (85.3 kg) IBW/kg (Calculated) : 68.4   Vital Signs: Temp: 97.3 F (36.3 C) (05/12 0752) Temp Source: Axillary (05/12 0324) BP: 110/71 mmHg (05/12 0800) Pulse Rate: 117 (05/12 0800)  Labs:  Recent Labs  09/19/14 0410  09/20/14 0335 09/20/14 0930 09/21/14 0105 09/21/14 0725 09/21/14 0851  HGB 9.7*  --  10.1*  --   --   --  10.5*  HCT 33.4*  --  36.1*  --   --   --  36.4*  PLT 201  --  226  --   --   --  168  APTT 60*  --   --   --   --   --   --   LABPROT 88.0*  < > >90.0*  --  >90.0* 70.2*  --   INR >10.00*  < > >10.00*  --  >10.00* 8.41*  --   CREATININE 2.88*  --  3.63* 3.79*  --  3.85*  --   < > = values in this interval not displayed.  Estimated Creatinine Clearance: 17.6 mL/min (by C-G formula based on Cr of 3.85).  Assessment: Patient's a 74 y.o M presented to the ED on 5/9 with c/o constipation and emesis. He was on coumadin PTA for afib with INR supratherapeutic at 9 on admit and was given multiple vit K doses for reversal.  Significant events: - 5/10 hemeoccult ( -) - emesis for a few days PTA, rising INR is likely due to poor intake and liver dysfunction - 5/10 Vit K 2.5mg  po x1 - 5/11 Vit K 1mg  SQ x1 and vit K 10 mg SQ x1 - 5/11 at 0230 Vit K 5mg  PO x1 and vit K 10mg  IV x1  Today, 09/21/14:  - INR 8.41 - cbc stable, no bleeding documented  Goal of Therapy:  INR 2-3   Plan:  - continue to hold coumadin for now  Dois Juarbe P 09/21/2014,10:19 AM

## 2014-09-21 NOTE — Progress Notes (Signed)
Pt has very poor effort doing Spiriva QD and Symbicort BID.

## 2014-09-21 NOTE — Progress Notes (Signed)
CRITICAL VALUE ALERT  Critical value received:  INR->10  Date of notification:  09/21/2014  Time of notification:  1:38 AM  Critical value read back:Yes.    Nurse who received alert:  Eugenio HoesL Tiann Saha, RN  MD notified (1st page):  [facility]  Time of first page:  1:38 AM

## 2014-09-21 NOTE — Progress Notes (Signed)
Attempted NGT placement x4 with 2 different RN's. No success. Tube coiled and came through mouth. Pt is unable to follow commands to swallow or to keep head forward.

## 2014-09-21 NOTE — Progress Notes (Signed)
ANTIBIOTIC CONSULT NOTE - FOLLOW UP  Pharmacy Consult for Vancomycin Indication: sepsis  No Known Allergies  Patient Measurements: Height: 6\' 1"  (185.4 cm) Weight: 188 lb 0.8 oz (85.3 kg) IBW/kg (Calculated) : 79.9 Adjusted Body Weight:   Vital Signs: Temp: 97 F (36.1 C) (05/11 2326) Temp Source: Oral (05/11 2326) BP: 103/74 mmHg (05/12 0200) Pulse Rate: 51 (05/12 0200) Intake/Output from previous day: 05/11 0701 - 05/12 0700 In: 900 [I.V.:800; IV Piggyback:100] Out: 460 [Urine:460] Intake/Output from this shift: Total I/O In: 250 [I.V.:250] Out: 60 [Urine:60]  Labs:  Recent Labs  10-02-2014 2159 09/19/14 0410 09/20/14 0335 09/20/14 0930  WBC 11.7* 10.8* 22.9*  --   HGB 10.5* 9.7* 10.1*  --   PLT 244 201 226  --   CREATININE 2.90* 2.88* 3.63* 3.79*   Estimated Creatinine Clearance: 19 mL/min (by C-G formula based on Cr of 3.79).  Recent Labs  09/21/14 0105  [facility]     Microbiology: Recent Results (from the past 720 hour(s))  Blood Culture (routine x 2)     Status: None (Preliminary result)   Collection Time: 09/19/14 12:15 AM  Result Value Ref Range Status   Specimen Description BLOOD RIGHT ANTECUBITAL  Final   Special Requests BOTTLES DRAWN AEROBIC AND ANAEROBIC 5ML  Final   Culture   Final           BLOOD CULTURE RECEIVED NO GROWTH TO DATE CULTURE WILL BE HELD FOR 5 DAYS BEFORE ISSUING A FINAL NEGATIVE REPORT Performed at [facility]    Report Status PENDING  Incomplete  Blood Culture (routine x 2)     Status: None (Preliminary result)   Collection Time: 09/19/14 12:16 AM  Result Value Ref Range Status   Specimen Description BLOOD L FOREARM  Final   Special Requests BOTTLES DRAWN AEROBIC AND ANAEROBIC 5ML  Final   Culture   Final           BLOOD CULTURE RECEIVED NO GROWTH TO DATE CULTURE WILL BE HELD FOR 5 DAYS BEFORE ISSUING A FINAL NEGATIVE REPORT Performed at [facility]    Report Status PENDING  Incomplete  Urine  culture     Status: None   Collection Time: 09/19/14  6:52 AM  Result Value Ref Range Status   Specimen Description URINE, RANDOM  Final   Special Requests NONE  Final   Colony Count NO GROWTH [facility] at [facility]   Final   Culture NO GROWTH Performed at [facility]   Final   Report Status 09/20/2014 FINAL  Final  MRSA PCR Screening     Status: None   Collection Time: 09/19/14  1:30 PM  Result Value Ref Range Status   MRSA by PCR NEGATIVE NEGATIVE Final    Comment:        The GeneXpert MRSA Assay (FDA approved for NASAL specimens only), is one component of a comprehensive MRSA colonization surveillance program. It is not intended to diagnose MRSA infection nor to guide or monitor treatment for MRSA infections.   Clostridium Difficile by PCR     Status: None   Collection Time: 09/20/14  4:35 PM  Result Value Ref Range Status   C difficile by pcr NEGATIVE NEGATIVE Final    Anti-infectives    Start     Dose/Rate Route Frequency Ordered Stop   09/21/14 0245  vancomycin (VANCOCIN) IVPB 1000 mg/200 mL premix     1,000 mg 200 mL/hr over 60 Minutes Intravenous  Once  09/21/14 0236     09/20/14 1600  vancomycin (VANCOCIN) 50 mg/mL oral solution 500 mg     500 mg Oral 4 times per day 09/20/14 1357 10/04/14 1759   09/20/14 1600  metroNIDAZOLE (FLAGYL) IVPB 500 mg     500 mg 100 mL/hr over 60 Minutes Intravenous Every 8 hours 09/20/14 1357 10/04/14 1559   09/20/14 0900  piperacillin-tazobactam (ZOSYN) IVPB 2.25 g     2.25 g 100 mL/hr over 30 Minutes Intravenous Every 6 hours 09/20/14 0823     09/19/14 2359  vancomycin (VANCOCIN) IVPB 750 mg/150 ml premix  Status:  Discontinued     750 mg 150 mL/hr over 60 Minutes Intravenous Every 24 hours 09/19/14 0044 09/20/14 0821   09/19/14 2200  ceFEPIme (MAXIPIME) 1 g in dextrose 5 % 50 mL IVPB  Status:  Discontinued     1 g 100 mL/hr over 30 Minutes Intravenous Every 24 hours 09/19/14 0042 09/19/14 0240   09/19/14  0800  piperacillin-tazobactam (ZOSYN) IVPB 3.375 g  Status:  Discontinued     3.375 g 12.5 mL/hr over 240 Minutes Intravenous Every 8 hours 09/19/14 0243 09/20/14 0821   10/04/2014 2345  ceFEPIme (MAXIPIME) 2 g in dextrose 5 % 50 mL IVPB     2 g 100 mL/hr over 30 Minutes Intravenous  Once 09/22/2014 2340 09/19/14 0049   10/10/2014 2345  vancomycin (VANCOCIN) IVPB 1000 mg/200 mL premix     1,000 mg 200 mL/hr over 60 Minutes Intravenous  Once 09/26/2014 2340 09/19/14 0209      Assessment: Patient with random vancomycin level at goal.  Patient has only received two doses of vancomycin 1gm and 750mg .  Renal function is still very poor.  Goal of Therapy:  Vancomycin trough level 15-20 mcg/ml  Plan:  Measure antibiotic drug levels at steady state Follow up culture results Vancomycin 1gm iv x1 now, then recheck level with 5/13 AM labs  Aleene DavidsonGrimsley Jr, Kirti Carl Crowford 09/21/2014,3:08 AM

## 2014-09-21 NOTE — Progress Notes (Signed)
Pam, RN attempted to place NGT after 0.5mg  ativan given. Tube coiled into mouth again. Dr. Rito EhrlichKrishnan paged and updated.

## 2014-09-21 NOTE — Progress Notes (Signed)
PROGRESS NOTE  Nathan [name] ZOX:[id number] DOB: 1940/10/10 DOA: 2014-09-28 PCP: Lillia Mountain, MD  HPI/Recap of past 86 hours: 74 year old male with history of systolic/diastolic heart failure, CAD status post CABG, atrial flutter and PE on chronic anticoagulation admitted on 5/9 for increased weakness after not having bowel movement times several days and found to have sepsis felt to be secondary to bilateral pneumonia and colitis seen on abdominal CT. Patient also noted to have severe coagulopathy with INR greater than 9, acute renal failure and hypotension.  Patient initially had some improvement on 5/10, but then by 5/11 is continued to suffer from hypotension, worsening leukocytosis, lactic acid level and renal function. Pressures were at best in the mid 80s, requiring fluid boluses and increase in overall IV fluids. Started prophylactically on by mouth vancomycin and IV Flagyl to try to treat C. difficile. No response to a number of enemas.  Today, patient is slightly more lethargic.  Interestingly, his white count, lactic acid level and renal function of all slightly improved. Although he did not have large bowel movement, he had enough where C. difficile cultures are able to be checked and found to be normal.    Assessment/Plan: Principal Problem:   Sepsis secondary to healthcare associated pneumonia and colitis: Continued on broad-spectrum antibiotics, despite this, white blood cell count initially increased to 22, but since improved today. Patient was criteria given hypotension, tachycardia, leukocytosis, lactic acid level and pulmonary/GI source. Meets  although C. difficile negative, suspect that this may be severe bacterial translocation of the gut. We'll plan for NG tube and GoLYTELY prep   Active Problems: \   Constipation: See above   AKI (acute kidney injury) in the setting of stage II chronic kidney disease: Secondary to sepsis. Improving with IV fluids :   Acute  systolic congestive heart failure: Unable to diurese given hypotension. Patient already showing signs of third spacing and coagulopathy almost certainly due to hepatic congestion from heart failure. We'll try to treat coagulopathy with aggressive subcutaneous vitamin K and once he is therapeutic enough, safe to put in a PICC line versus central line and then consider pressor support plus aggressive diuresis. INR finally starting to trend down   HLD (hyperlipidemia): Holding statin secondary to increased liver function tests   GERD without esophagitis   Code Status: Full code  Family Communication: Wife at the bedside  Disposition Plan: Continue IC status until sepsis resolved and able to initiate diuresis   Consultants:  None  Procedures:  None  Antibiotics:  IV vancomycin 5/9-present  IV Zosyn 5/9-present  By mouth vancomycin 5/11-5/12  IV Flagyl 5/11-5/12   Objective: BP 117/72 mmHg  Pulse 32  Temp(Src) 98.4 F (36.9 C) (Axillary)  Resp 17  Ht  (1.727 m)  Wt 85.3 kg (188 lb 0.8 oz)  BMI 24.82 kg/m2  SpO2 90%  Intake/Output Summary (Last 24 hours) at 09/21/14 1325 Last data filed at 09/21/14 0824  Gross per 24 hour  Intake   2675 ml  Output    460 ml  Net   2215 ml   Filed Weights   2014/09/28 2358 09/19/14 1318  Weight: 73.5 kg (162 lb 0.6 oz) 85.3 kg (188 lb 0.8 oz)    Exam:   General:  Alert and oriented 2, lethargic, confused  Cardiovascular: Regular rate and rhythm, S1-S2  Respiratory: Decreased breath sounds bibasilar  Abdomen: Soft, distended, hypoactive bowel sounds, nontender  Musculoskeletal: 3 plus pitting edema from the knees down bilaterally  Data Reviewed: Basic Metabolic Panel:  Recent Labs Lab 09/16/2014 2159 09/19/14 0410 09/20/14 0335 09/20/14 0930 09/21/14 0725  NA 133* 133* 136 136 137  K 4.9 5.0 6.6* 5.9* 5.6*  CL 95* 98* 99* 100* 100*  CO2 23 23 18* 23 21*  GLUCOSE 89 109* 47* 116* 72  BUN 59* 57* 68* 73* 76*    CREATININE 2.90* 2.88* 3.63* 3.79* 3.85*  CALCIUM 8.8* 8.3* 8.6* 8.7* 8.2*   Liver Function Tests:  Recent Labs Lab 09/25/2014 2159 09/19/14 0410  AST 143* 127*  ALT 153* 145*  ALKPHOS 143* 122  BILITOT 2.0* 1.8*  PROT 7.4 6.4*  ALBUMIN 2.9* 2.5*   No results for input(s): LIPASE, AMYLASE in the last 168 hours. No results for input(s): AMMONIA in the last 168 hours. CBC:  Recent Labs Lab 10/10/2014 2159 09/19/14 0410 09/20/14 0335 09/21/14 0851  WBC 11.7* 10.8* 22.9* 14.8*  NEUTROABS 9.5*  --   --   --   HGB 10.5* 9.7* 10.1* 10.5*  HCT 36.3* 33.4* 36.1* 36.4*  MCV 84.4 84.1 85.1 84.1  PLT 244 201 226 168   Cardiac Enzymes:   No results for input(s): CKTOTAL, CKMB, CKMBINDEX, TROPONINI in the last 168 hours. BNP (last 3 results)  Recent Labs  06/26/14 0357 07/02/14 0410 09/12/2014 2159  BNP 626.8* 703.3* 1350.8*    ProBNP (last 3 results)  Recent Labs  01/04/14 0450 04/21/14 1504  PROBNP 3786.0* 13092.0*    CBG:  Recent Labs Lab 09/20/14 0452 09/20/14 0542 09/20/14 0618 09/20/14 0700 09/20/14 1135  GLUCAP 144* 113* 138* 132* 98    Recent Results (from the past 240 hour(s))  Blood Culture (routine x 2)     Status: None (Preliminary result)   Collection Time: 09/19/14 12:15 AM  Result Value Ref Range Status   Specimen Description BLOOD RIGHT ANTECUBITAL  Final   Special Requests BOTTLES DRAWN AEROBIC AND ANAEROBIC 5ML  Final   Culture   Final           BLOOD CULTURE RECEIVED NO GROWTH TO DATE CULTURE WILL BE HELD FOR 5 DAYS BEFORE ISSUING A FINAL NEGATIVE REPORT Performed at [facility]    Report Status PENDING  Incomplete  Blood Culture (routine x 2)     Status: None (Preliminary result)   Collection Time: 09/19/14 12:16 AM  Result Value Ref Range Status   Specimen Description BLOOD L FOREARM  Final   Special Requests BOTTLES DRAWN AEROBIC AND ANAEROBIC 5ML  Final   Culture   Final           BLOOD CULTURE RECEIVED NO GROWTH TO DATE  CULTURE WILL BE HELD FOR 5 DAYS BEFORE ISSUING A FINAL NEGATIVE REPORT Performed at [facility]    Report Status PENDING  Incomplete  Urine culture     Status: None   Collection Time: 09/19/14  6:52 AM  Result Value Ref Range Status   Specimen Description URINE, RANDOM  Final   Special Requests NONE  Final   Colony Count NO GROWTH Performed at [facility]   Final   Culture NO GROWTH Performed at [facility]   Final   Report Status 09/20/2014 FINAL  Final  MRSA PCR Screening     Status: None   Collection Time: 09/19/14  1:30 PM  Result Value Ref Range Status   MRSA by PCR NEGATIVE NEGATIVE Final    Comment:        The GeneXpert MRSA Assay (FDA  approved for NASAL specimens only), is one component of a comprehensive MRSA colonization surveillance program. It is not intended to diagnose MRSA infection nor to guide or monitor treatment for MRSA infections.   Clostridium Difficile by PCR     Status: None   Collection Time: 09/20/14  4:35 PM  Result Value Ref Range Status   C difficile by pcr NEGATIVE NEGATIVE Final     Studies: No results found.  Scheduled Meds: . bisacodyl  10 mg Oral Once  . budesonide-formoterol  2 puff Inhalation BID  . buPROPion  300 mg Oral Daily  . carvedilol  3.125 mg Oral BID WC  . colchicine  0.3 mg Oral Daily  . docusate sodium  100 mg Oral BID  . mirtazapine  7.5 mg Oral QHS  .  morphine injection  1 mg Intravenous Once  . pantoprazole  40 mg Oral Daily  . piperacillin-tazobactam (ZOSYN)  IV  2.25 g Intravenous Q6H  . polyethylene glycol  17 g Oral Daily  . polyethylene glycol-electrolytes  4,000 mL Oral Once  . senna  1 tablet Oral Daily  . sodium chloride  3 mL Intravenous Q12H  . tiotropium  18 mcg Inhalation Daily    Continuous Infusions: . sodium chloride 125 mL/hr at 09/21/14 [id number]     Time spent: 45 minutes  Hollice EspyKRISHNAN,Shayla Heming K  [facility] Pager [phone]. If 7PM-7AM, please contact  night-coverage at [email or web address], password [email or web address] 09/21/2014, 1:25 PM  LOS: 2 days

## 2014-09-22 ENCOUNTER — Inpatient Hospital Stay (HOSPITAL_COMMUNITY): Payer: Medicare PPO

## 2014-09-22 ENCOUNTER — Inpatient Hospital Stay (HOSPITAL_COMMUNITY): Payer: Medicare PPO | Admitting: Registered Nurse

## 2014-09-22 DIAGNOSIS — I509 Heart failure, unspecified: Secondary | ICD-10-CM

## 2014-09-22 DIAGNOSIS — E440 Moderate protein-calorie malnutrition: Secondary | ICD-10-CM | POA: Insufficient documentation

## 2014-09-22 DIAGNOSIS — R579 Shock, unspecified: Secondary | ICD-10-CM | POA: Insufficient documentation

## 2014-09-22 LAB — CORTISOL: Cortisol, Plasma: 40.7 ug/dL

## 2014-09-22 LAB — BLOOD GAS, ARTERIAL
ACID-BASE DEFICIT: 13.4 mmol/L — AB (ref 0.0–2.0)
Acid-base deficit: 9.7 mmol/L — ABNORMAL HIGH (ref 0.0–2.0)
Bicarbonate: 12.3 mEq/L — ABNORMAL LOW (ref 20.0–24.0)
Bicarbonate: 13.9 mEq/L — ABNORMAL LOW (ref 20.0–24.0)
Drawn by: 308601
Drawn by: 308601
FIO2: 1 %
FIO2: 0.5 %
MECHVT: 640 mL
O2 Saturation: 99.7 %
O2 Saturation: 99.7 %
PATIENT TEMPERATURE: 98.6
PEEP: 5 cmH2O
PEEP: 5 cmH2O
PO2 ART: 328 mmHg — AB (ref 80.0–100.0)
Patient temperature: 98.6
RATE: 12 resp/min
RATE: 18 resp/min
TCO2: 11.7 mmol/L (ref 0–100)
TCO2: 13 mmol/L (ref 0–100)
VT: 640 mL
pCO2 arterial: 29 mmHg — ABNORMAL LOW (ref 35.0–45.0)
pCO2 arterial: 24.6 mmHg — ABNORMAL LOW (ref 35.0–45.0)
pH, Arterial: 7.251 — ABNORMAL LOW (ref 7.350–7.450)
pH, Arterial: 7.371 (ref 7.350–7.450)
pO2, Arterial: 213 mmHg — ABNORMAL HIGH (ref 80.0–100.0)

## 2014-09-22 LAB — CBC
HCT: 32.2 % — ABNORMAL LOW (ref 39.0–52.0)
Hemoglobin: 9.6 g/dL — ABNORMAL LOW (ref 13.0–17.0)
MCH: 25 pg — AB (ref 26.0–34.0)
MCHC: 29.8 g/dL — AB (ref 30.0–36.0)
MCV: 83.9 fL (ref 78.0–100.0)
Platelets: 125 10*3/uL — ABNORMAL LOW (ref 150–400)
RBC: 3.84 MIL/uL — AB (ref 4.22–5.81)
RDW: 20.2 % — AB (ref 11.5–15.5)
WBC: 12.1 10*3/uL — ABNORMAL HIGH (ref 4.0–10.5)

## 2014-09-22 LAB — PROTIME-INR
INR: 8.41 (ref 0.00–1.49)
INR: 4.41 — ABNORMAL HIGH (ref 0.00–1.49)
INR: 4.36 — AB (ref 0.00–1.49)
PROTHROMBIN TIME: 42.1 s — AB (ref 11.6–15.2)
Prothrombin Time: 70.2 seconds — ABNORMAL HIGH (ref 11.6–15.2)
Prothrombin Time: 42.4 seconds — ABNORMAL HIGH (ref 11.6–15.2)

## 2014-09-22 LAB — TROPONIN I
Troponin I: 0.08 ng/mL — ABNORMAL HIGH (ref <0.031)
Troponin I: 0.08 ng/mL — ABNORMAL HIGH (ref <0.031)
Troponin I: 0.08 ng/mL — ABNORMAL HIGH (ref <0.031)

## 2014-09-22 LAB — MAGNESIUM: Magnesium: 2.3 mg/dL (ref 1.7–2.4)

## 2014-09-22 LAB — CBC WITH DIFFERENTIAL/PLATELET
BASOS ABS: 0 10*3/uL (ref 0.0–0.1)
Basophils Relative: 0 % (ref 0–1)
EOS PCT: 0 % (ref 0–5)
Eosinophils Absolute: 0 10*3/uL (ref 0.0–0.7)
HEMATOCRIT: 38.4 % — AB (ref 39.0–52.0)
Hemoglobin: 11.1 g/dL — ABNORMAL LOW (ref 13.0–17.0)
LYMPHS ABS: 0.9 10*3/uL (ref 0.7–4.0)
LYMPHS PCT: 6 % — AB (ref 12–46)
MCH: 24.6 pg — ABNORMAL LOW (ref 26.0–34.0)
MCHC: 28.9 g/dL — AB (ref 30.0–36.0)
MCV: 85.1 fL (ref 78.0–100.0)
MONO ABS: 0.7 10*3/uL (ref 0.1–1.0)
Monocytes Relative: 5 % (ref 3–12)
NEUTROS ABS: 13.8 10*3/uL — AB (ref 1.7–7.7)
NEUTROS PCT: 89 % — AB (ref 43–77)
PLATELETS: 149 10*3/uL — AB (ref 150–400)
RBC: 4.51 MIL/uL (ref 4.22–5.81)
RDW: 20.4 % — ABNORMAL HIGH (ref 11.5–15.5)
WBC: 15.4 10*3/uL — ABNORMAL HIGH (ref 4.0–10.5)

## 2014-09-22 LAB — COMPREHENSIVE METABOLIC PANEL
ALBUMIN: 2.1 g/dL — AB (ref 3.5–5.0)
ALT: 496 U/L — AB (ref 17–63)
AST: 385 U/L — ABNORMAL HIGH (ref 15–41)
Alkaline Phosphatase: 97 U/L (ref 38–126)
Anion gap: 16 — ABNORMAL HIGH (ref 5–15)
BUN: 85 mg/dL — AB (ref 6–20)
CALCIUM: 7.8 mg/dL — AB (ref 8.9–10.3)
CHLORIDE: 104 mmol/L (ref 101–111)
CO2: 18 mmol/L — ABNORMAL LOW (ref 22–32)
Creatinine, Ser: 3.97 mg/dL — ABNORMAL HIGH (ref 0.61–1.24)
GFR calc non Af Amer: 14 mL/min — ABNORMAL LOW (ref >60)
GFR, EST AFRICAN AMERICAN: 16 mL/min — AB (ref >60)
Glucose, Bld: 109 mg/dL — ABNORMAL HIGH (ref 65–99)
Potassium: 4.7 mmol/L (ref 3.5–5.1)
SODIUM: 138 mmol/L (ref 135–145)
TOTAL PROTEIN: 5.3 g/dL — AB (ref 6.5–8.1)
Total Bilirubin: 3 mg/dL — ABNORMAL HIGH (ref 0.3–1.2)

## 2014-09-22 LAB — BASIC METABOLIC PANEL
Anion gap: 16 — ABNORMAL HIGH (ref 5–15)
BUN: 86 mg/dL — ABNORMAL HIGH (ref 6–20)
CHLORIDE: 108 mmol/L (ref 101–111)
CO2: 15 mmol/L — AB (ref 22–32)
Calcium: 8.3 mg/dL — ABNORMAL LOW (ref 8.9–10.3)
Creatinine, Ser: 3.94 mg/dL — ABNORMAL HIGH (ref 0.61–1.24)
GFR, EST AFRICAN AMERICAN: 16 mL/min — AB (ref >60)
GFR, EST NON AFRICAN AMERICAN: 14 mL/min — AB (ref >60)
Glucose, Bld: 47 mg/dL — ABNORMAL LOW (ref 65–99)
Potassium: 6.1 mmol/L (ref 3.5–5.1)
Sodium: 139 mmol/L (ref 135–145)

## 2014-09-22 LAB — LACTIC ACID, PLASMA
LACTIC ACID, VENOUS: 4.3 mmol/L — AB (ref 0.5–2.0)
Lactic Acid, Venous: 5.6 mmol/L (ref 0.5–2.0)

## 2014-09-22 LAB — PHOSPHORUS: Phosphorus: 7.1 mg/dL — ABNORMAL HIGH (ref 2.5–4.6)

## 2014-09-22 LAB — HEPATIC FUNCTION PANEL
ALT: 607 U/L — ABNORMAL HIGH (ref 17–63)
AST: 490 U/L — AB (ref 15–41)
Albumin: 2.4 g/dL — ABNORMAL LOW (ref 3.5–5.0)
Alkaline Phosphatase: 110 U/L (ref 38–126)
Bilirubin, Direct: 1.6 mg/dL — ABNORMAL HIGH (ref 0.1–0.5)
Indirect Bilirubin: 1.4 mg/dL — ABNORMAL HIGH (ref 0.3–0.9)
TOTAL PROTEIN: 6.2 g/dL — AB (ref 6.5–8.1)
Total Bilirubin: 3 mg/dL — ABNORMAL HIGH (ref 0.3–1.2)

## 2014-09-22 LAB — POTASSIUM: Potassium: 4.5 mmol/L (ref 3.5–5.1)

## 2014-09-22 LAB — GLUCOSE, CAPILLARY
Glucose-Capillary: 78 mg/dL (ref 65–99)
Glucose-Capillary: 83 mg/dL (ref 65–99)

## 2014-09-22 LAB — AMMONIA: Ammonia: 38 umol/L — ABNORMAL HIGH (ref 9–35)

## 2014-09-22 LAB — PROCALCITONIN: Procalcitonin: 0.69 ng/mL

## 2014-09-22 MED ORDER — SODIUM CHLORIDE 0.9 % IV SOLN
25.0000 ug/h | INTRAVENOUS | Status: DC
  Administered 2014-09-22: 50 ug/h via INTRAVENOUS
  Administered 2014-09-22: 200 ug/h via INTRAVENOUS
  Administered 2014-09-23: 250 ug/h via INTRAVENOUS
  Administered 2014-09-23: 50 ug/h via INTRAVENOUS
  Filled 2014-09-22 (×3): qty 50

## 2014-09-22 MED ORDER — VITAL HIGH PROTEIN PO LIQD
1000.0000 mL | ORAL | Status: DC
Start: 2014-09-22 — End: 2014-09-22
  Administered 2014-09-22: 1000 mL
  Filled 2014-09-22: qty 1000

## 2014-09-22 MED ORDER — FENTANYL CITRATE (PF) 100 MCG/2ML IJ SOLN: 50.0000 ug | Freq: Once | INTRAMUSCULAR | Status: DC

## 2014-09-22 MED ORDER — SODIUM POLYSTYRENE SULFONATE 15 GM/60ML PO SUSP
30.0000 g | Freq: Once | ORAL | Status: AC
Start: 2014-09-22 — End: 2014-09-22
  Administered 2014-09-22: 30 g via ORAL
  Filled 2014-09-22: qty 120

## 2014-09-22 MED ORDER — PANTOPRAZOLE SODIUM 40 MG IV SOLR: 40.0000 mg | INTRAVENOUS | Status: DC

## 2014-09-22 MED ORDER — CETYLPYRIDINIUM CHLORIDE 0.05 % MT LIQD
7.0000 mL | Freq: Four times a day (QID) | OROMUCOSAL | Status: DC
  Administered 2014-09-22 – 2014-10-03 (×42): 7 mL via OROMUCOSAL

## 2014-09-22 MED ORDER — MIDAZOLAM HCL 2 MG/2ML IJ SOLN
2.0000 mg | INTRAMUSCULAR | Status: DC | PRN
  Administered 2014-09-22: 2 mg via INTRAVENOUS

## 2014-09-22 MED ORDER — MIDAZOLAM HCL 2 MG/2ML IJ SOLN
INTRAMUSCULAR | Status: AC
  Filled 2014-09-22: qty 2

## 2014-09-22 MED ORDER — CHLORHEXIDINE GLUCONATE 0.12 % MT SOLN: 15.0000 mL | Freq: Two times a day (BID) | OROMUCOSAL | Status: DC

## 2014-09-22 MED ORDER — PROPOFOL 10 MG/ML IV BOLUS
INTRAVENOUS | Status: DC | PRN
  Administered 2014-09-22: 100 mg via INTRAVENOUS

## 2014-09-22 MED ORDER — DEXTROSE 5 % IV SOLN
30.0000 ug/min | INTRAVENOUS | Status: DC
  Administered 2014-09-22: 30 ug/min via INTRAVENOUS
  Administered 2014-09-23: 25 ug/min via INTRAVENOUS
  Filled 2014-09-22 (×6): qty 1

## 2014-09-22 MED ORDER — BUDESONIDE 0.25 MG/2ML IN SUSP
0.2500 mg | Freq: Four times a day (QID) | RESPIRATORY_TRACT | Status: DC
  Administered 2014-09-22 (×2): 0.25 mg via RESPIRATORY_TRACT
  Filled 2014-09-22: qty 2

## 2014-09-22 MED ORDER — CHLORHEXIDINE GLUCONATE 0.12 % MT SOLN
15.0000 mL | Freq: Two times a day (BID) | OROMUCOSAL | Status: DC
  Administered 2014-09-22 – 2014-10-02 (×22): 15 mL via OROMUCOSAL
  Filled 2014-09-22 (×24): qty 15

## 2014-09-22 MED ORDER — FENTANYL BOLUS VIA INFUSION
25.0000 ug | INTRAVENOUS | Status: DC | PRN
  Administered 2014-09-22: 25 ug via INTRAVENOUS
  Filled 2014-09-22: qty 25

## 2014-09-22 MED ORDER — ATORVASTATIN CALCIUM 10 MG PO TABS
10.0000 mg | ORAL_TABLET | Freq: Every day | ORAL | Status: DC
  Administered 2014-09-22 – 2014-10-02 (×10): 10 mg via ORAL
  Filled 2014-09-22 (×11): qty 1

## 2014-09-22 MED ORDER — DEXTROSE 50 % IV SOLN
1.0000 | Freq: Once | INTRAVENOUS | Status: AC
  Administered 2014-09-22: 50 mL via INTRAVENOUS
  Filled 2014-09-22: qty 50

## 2014-09-22 MED ORDER — BUDESONIDE 0.25 MG/2ML IN SUSP
0.5000 mg | Freq: Two times a day (BID) | RESPIRATORY_TRACT | Status: DC
  Administered 2014-09-22 – 2014-09-23 (×3): 0.5 mg via RESPIRATORY_TRACT
  Filled 2014-09-22 (×4): qty 4

## 2014-09-22 MED ORDER — MIDAZOLAM HCL 2 MG/2ML IJ SOLN
2.0000 mg | INTRAMUSCULAR | Status: DC | PRN
  Administered 2014-09-22 (×4): 2 mg via INTRAVENOUS
  Filled 2014-09-22 (×4): qty 2

## 2014-09-22 MED ORDER — CETYLPYRIDINIUM CHLORIDE 0.05 % MT LIQD: 7.0000 mL | Freq: Four times a day (QID) | OROMUCOSAL | Status: DC

## 2014-09-22 MED ORDER — FENTANYL CITRATE (PF) 100 MCG/2ML IJ SOLN
50.0000 ug | INTRAMUSCULAR | Status: DC | PRN
  Administered 2014-09-22: 50 ug via INTRAVENOUS
  Filled 2014-09-22 (×2): qty 2

## 2014-09-22 MED ORDER — SODIUM BICARBONATE 8.4 % IV SOLN
50.0000 meq | Freq: Once | INTRAVENOUS | Status: AC
  Administered 2014-09-22: 50 meq via INTRAVENOUS
  Filled 2014-09-22: qty 50

## 2014-09-22 MED ORDER — FENTANYL CITRATE (PF) 100 MCG/2ML IJ SOLN: 50.0000 ug | INTRAMUSCULAR | Status: DC | PRN

## 2014-09-22 MED ORDER — PANTOPRAZOLE SODIUM 40 MG PO PACK
40.0000 mg | PACK | ORAL | Status: DC
Start: 2014-09-22 — End: 2014-09-27
  Administered 2014-09-22 – 2014-09-26 (×5): 40 mg
  Filled 2014-09-22 (×6): qty 20

## 2014-09-22 MED ORDER — VITAL AF 1.2 CAL PO LIQD
1000.0000 mL | ORAL | Status: DC
  Administered 2014-09-22 – 2014-09-26 (×6): 1000 mL
  Filled 2014-09-22 (×9): qty 1000

## 2014-09-22 MED ORDER — PROPOFOL 10 MG/ML IV BOLUS
INTRAVENOUS | Status: AC
  Filled 2014-09-22: qty 20

## 2014-09-22 MED ORDER — ATROPINE SULFATE 0.1 MG/ML IJ SOLN
INTRAMUSCULAR | Status: AC
  Filled 2014-09-22: qty 10

## 2014-09-22 MED ORDER — IPRATROPIUM-ALBUTEROL 0.5-2.5 (3) MG/3ML IN SOLN
3.0000 mL | Freq: Four times a day (QID) | RESPIRATORY_TRACT | Status: DC
  Administered 2014-09-22 – 2014-09-30 (×35): 3 mL via RESPIRATORY_TRACT
  Filled 2014-09-22 (×34): qty 3

## 2014-09-22 MED ORDER — SODIUM CHLORIDE 0.9 % IV BOLUS (SEPSIS)
1000.0000 mL | Freq: Once | INTRAVENOUS | Status: AC
  Administered 2014-09-22: 1000 mL via INTRAVENOUS

## 2014-09-22 MED ORDER — INSULIN ASPART 100 UNIT/ML IV SOLN
10.0000 [IU] | Freq: Once | INTRAVENOUS | Status: AC
  Administered 2014-09-22: 10 [IU] via INTRAVENOUS

## 2014-09-22 MED ORDER — LEVALBUTEROL HCL 0.63 MG/3ML IN NEBU
0.6300 mg | INHALATION_SOLUTION | RESPIRATORY_TRACT | Status: DC | PRN
  Administered 2014-10-02 (×2): 0.63 mg via RESPIRATORY_TRACT
  Filled 2014-09-22 (×2): qty 3

## 2014-09-22 NOTE — Procedures (Signed)
Central Venous Catheter Insertion Procedure Note Nathan KellMaurice W Grimmer [id number] 06/11/1940  Procedure: Insertion of Central Venous Catheter Indications: Assessment of intravascular volume, Drug and/or fluid administration and Frequent blood sampling  Procedure Details Consent: Risks of procedure as well as the alternatives and risks of each were explained to the (patient/caregiver).  Consent for procedure obtained. Time Out: Verified patient identification, verified procedure, site/side was marked, verified correct patient position, special equipment/implants available, medications/allergies/relevent history reviewed, required imaging and test results available.  Performed  Maximum sterile technique was used including antiseptics, cap, gloves, gown, hand hygiene, mask and sheet. Skin prep: Chlorhexidine; local anesthetic administered A antimicrobial bonded/coated triple lumen catheter was placed in the left internal jugular vein using the Seldinger technique. Ultrasound guidance used.Yes.   Catheter placed to 22 cm. Blood aspirated via all 3 ports and then flushed x 3. Line sutured x 2 and dressing applied.  Evaluation Blood flow good Complications: No apparent complications Patient did tolerate procedure well. Chest X-ray ordered to verify placement.  CXR: pending.  Joneen RoachPaul Beckem Tomberlin, AGACNP-BC [facility] Pager [phone] or [phone]  09/22/2014 4:41 AM

## 2014-09-22 NOTE — Progress Notes (Signed)
Date:  Sep 22, 2014 U.R. performed for needs and level of care. Will continue to follow for Case Management needs.  Rhonda Davis, RN, BSN, CCM   [phone] 

## 2014-09-22 NOTE — Progress Notes (Signed)
CRITICAL VALUE ALERT  Critical value received:  K+ 6.1  Date of notification:  09-22-2014  Time of notification:  0305  Critical value read back:Yes.    Nurse who received alert:  Severin Bou D. Heleena Miceli  MD notified (1st page):  Joneen RoachPaul Hoffman  Time of first page:  0305  MD notified (2nd page): N/A  Time of second page: N/A  Responding MD:  Joneen RoachPaul Hoffman  Time MD responded:  [phone]

## 2014-09-22 NOTE — Progress Notes (Signed)
CRITICAL VALUE ALERT  Critical value received:  Lactic acid 4.3  Date of notification:  09-22-2014  Time of notification:  0624 am  Critical value read back:  yes  Nurse who received alert:  Delia HeadyScott Krofts/Leola Fiore, RN  MD notified (1st page):    Time of first page:    MD notified (2nd page):  Time of second page:  Responding MD:    Time MD responded:

## 2014-09-22 NOTE — Progress Notes (Signed)
eLink Physician-Brief Progress Note Patient Name: Nathan [name] Ra DOB: 04/15/1941 MRN: [id number]   Date of Service  09/22/2014  HPI/Events of Note    eICU Interventions  flexiseal requested, diarrhea [name] risk for skin breakdown     Intervention Category Evaluation Type: Other  Gwendalynn Eckstrom S. 09/22/2014, 8:22 PM

## 2014-09-22 NOTE — Care Management Note (Signed)
Case Management Note  Patient Details  Name: Allen KellMaurice W Berquist MRN: [id number] Date of Birth: 05/05/1941  Subjective/Objective:                Intubated on [id number]    Action/Plan:   Expected Discharge Date:   (UNKNOWN)               Expected Discharge Plan:  Home/Self Care  In-House Referral:  NA  Discharge planning Services  CM Consult  Post Acute Care Choice:  NA Choice offered to:  NA  DME Arranged:    DME Agency:     HH Arranged:    HH Agency:     Status of Service:  In process, will continue to follow  Medicare Important Message Given:    Date Medicare IM Given:    Medicare IM give by:    Date Additional Medicare IM Given:    Additional Medicare Important Message give by:     If discussed at Long Length of Stay Meetings, dates discussed:    Additional Comments:  Golda AcreDavis, Twisha Vanpelt Lynn, RN 09/22/2014, 10:21 AM

## 2014-09-22 NOTE — Progress Notes (Signed)
  Echocardiogram 2D Echocardiogram has been performed.  Delcie RochENNINGTON, Cynthea Zachman 09/22/2014, 3:01 PM

## 2014-09-22 NOTE — Consult Note (Signed)
PULMONARY / CRITICAL CARE MEDICINE   Name: Nathan [name] MRN: [id number] DOB: 05/01/1941    ADMISSION DATE:  09/16/2014 CONSULTATION DATE:  09/22/2014  REFERRING MD :  Rito EhrlichKrishnan  CHIEF COMPLAINT:  Respiratory failure  INITIAL PRESENTATION: 74 year old male presented to ED 5/9 c/o constipation x 3 days. Admitted for sepsis 2nd to colitis/HCAP. Initially with elevated lactic. AF RVR on coumadin. INR > 10 at presentation. Had been improving until 5/12 early am with rapid deterioration with obtundation, bradycardia, and hypoxia (50% O2 sat). Intubated by CRNA for respiratory insufficiency. PCCM asked to see  STUDIES:  Admission CXR > bilateral effusions R>L.  5/9 CT abd > Small bilateral pleural effusions greater on the right. Consolidation in the right lower lung suggesting pneumonia. Small amount of abdominal and pelvic free fluid. Increased density suggesting sludge versus milk of calcium in the gallbladder. Mild stranding around the sigmoid colon suggesting early diverticulitis.Left inguinal hernia containing fat and fluid.  SIGNIFICANT EVENTS: 5/9 admitted for sepsis/colitis/HCAP 5/13 rapid deterioration, intubated  HISTORY OF PRESENT ILLNESS:  74 year old male with multiple medical problems as listed below, which are significant for, PAF on coumadin, GOLD C COPD, CAD, HTN, systolic/diastolic CHF, CKD III, and PE. He was recently admitted for hip fracture, which was to be managed conservatively. During that admission he was found to have pulmonary embolism and started on heprain >> coumadin. Also developed Atrial fibrillation. He was moved to ICU as a result of these things and required BiPAP also had ileus. Had improved and was sent home. 5/9 [facility] complaining of constipation x 3 days, fever, chills. He was suspected to have colitis and area of consolidation on CXR. Elevated lactic as well. He was admitted to hospitalist team for sepsis secondary to colitis/PNA. He also  developed atrial fibrillation with rapid ventricular response. He was not attempted on rate control medications besides his home beta blockers. INR was elevated at > 10 at time of admission, got vitamin K. Despite IV antibiotics and fluid boluses he remained tachycardic and hypotensive. Lactic acid had gone down, but had never entirely cleared. 5/12 early AM he developed bradycardia, hypoxia with sats around 50, and became obtunded. He was intubated by CRNA. PCCM called for consultation.   PAST MEDICAL HISTORY :   has a past medical history of Aorto-iliac disease; CAD (coronary artery disease); Hypertension; ED (erectile dysfunction); COPD, severe; Hyperlipidemia; Stroke; GERD (gastroesophageal reflux disease); Gout; Paroxysmal atrial flutter; Chronic combined systolic and diastolic CHF (congestive heart failure); PAF (paroxysmal atrial fibrillation); CKD (chronic kidney disease), stage III; Tobacco abuse; History of noncompliance with medical treatment; Carotid artery disease; Anemia; Lower GI bleed; Mitral regurgitation; Hypertensive heart disease; and PE (pulmonary embolism).  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 Prior to Admission medications   Medication Sig Start Date End Date Taking? Authorizing Provider  albuterol (PROVENTIL) (2.5 MG/3ML) 0.083% nebulizer solution Take 2.5 mg by nebulization 3 (three) times daily. And Q6H PRN   Yes Historical Provider, MD  atorvastatin (LIPITOR) 10 MG tablet Take 1 tablet (10 mg total) by mouth daily at 6 PM. 01/05/14  Yes Jeralyn BennettEzequiel Zamora, MD  bisacodyl (DULCOLAX) 5 MG EC tablet Take 5 mg by mouth daily as needed for mild constipation or  moderate  constipation.   Yes Historical Provider, MD  budesonide-formoterol (SYMBICORT) 160-4.5 MCG/ACT inhaler Inhale 2 puffs into the lungs 2 (two) times daily.   Yes Historical Provider, MD  buPROPion (WELLBUTRIN XL) 300 MG 24 hr tablet Take 300 mg by mouth daily.   Yes Historical Provider, MD  carvedilol (COREG) 3.125 MG tablet Take 1 tablet (3.125 mg total) by mouth 2 (two) times daily with a meal. 07/10/14  Yes Osvaldo ShipperGokul Krishnan, MD  colchicine 0.6 MG tablet Take 0.6 mg by mouth daily.   Yes Historical Provider, MD  furosemide (LASIX) 40 MG tablet Take 1 tablet (40 mg total) by mouth daily. 06/20/14  Yes Lyn RecordsHenry W Smith, MD  mirtazapine (REMERON) 7.5 MG tablet Take 7.5 mg by mouth at bedtime.   Yes Historical Provider, MD  nicotine (NICODERM CQ - DOSED IN MG/24 HOURS) 21 mg/24hr patch Place 1 patch (21 mg total) onto the skin daily. 07/10/14  Yes Osvaldo ShipperGokul Krishnan, MD  oxyCODONE (OXY IR/ROXICODONE) 5 MG immediate release tablet Take 1 tablet (5 mg total) by mouth every 6 (six) hours as needed for severe pain. 08/03/14  Yes Sharon SellerJessica K Eubanks, NP  pantoprazole (PROTONIX) 40 MG tablet Take 1 tablet (40 mg total) by mouth daily. 07/10/14  Yes Osvaldo ShipperGokul Krishnan, MD  polyethylene glycol (MIRALAX / GLYCOLAX) packet Take 17 g by mouth daily. 07/10/14  Yes Osvaldo ShipperGokul Krishnan, MD  potassium chloride 20 MEQ TBCR Take 20 mEq by mouth 2 (two) times daily. 07/10/14  Yes Osvaldo ShipperGokul Krishnan, MD  warfarin (COUMADIN) 4 MG tablet Take 4 mg by mouth daily.   Yes Historical Provider, MD  acetaminophen (TYLENOL) 325 MG tablet Take 1 tablet (325 mg total) by mouth every 6 (six) hours. 07/10/14   Tiffany Neva SeatGreene, PA-C  docusate sodium (COLACE) 100 MG capsule Take 1 capsule (100 mg total) by mouth 2 (two) times daily. Patient not taking: Reported on 09/11/2014 07/10/14   Osvaldo ShipperGokul Krishnan, MD  feeding supplement, ENSURE COMPLETE, (ENSURE COMPLETE) LIQD Take 237 mLs by mouth 2 (two) times daily between meals. Patient not taking: Reported on 09/20/2014  07/10/14   Osvaldo ShipperGokul Krishnan, MD  guaiFENesin (MUCINEX) 600 MG 12 hr tablet Take 600 mg by mouth 2 (two) times daily as needed for cough.     Historical Provider, MD  ipratropium-albuterol (DUONEB) 0.5-2.5 (3) MG/3ML SOLN Take 3 mLs by nebulization every 4 (four) hours as needed. For shortness of breath    Historical Provider, MD  nitroGLYCERIN (NITROSTAT) 0.4 MG SL tablet Place 0.4 mg under the tongue every 5 (five) minutes as needed for chest pain.    Historical Provider, MD  saccharomyces boulardii (FLORASTOR) 250 MG capsule Take 1 capsule (250 mg total) by mouth 2 (two) times daily. Patient not taking: Reported on 09/16/2014 07/10/14   Osvaldo ShipperGokul Krishnan, MD  sennosides (SENOKOT) 8.8 MG/5ML syrup Take 5 mLs by mouth 2 (two) times daily. Patient not taking: Reported on 09/16/2014 07/10/14   Osvaldo ShipperGokul Krishnan, MD  tiotropium (SPIRIVA) 18 MCG inhalation capsule Place 1 capsule (18 mcg total) into inhaler and inhale daily. Patient not taking: Reported on 09/19/2014 06/18/14   Rhetta MuraJai-Gurmukh Samtani, MD   No Known Allergies  FAMILY HISTORY:  indicated that his mother is deceased. He indicated that his father is deceased.  SOCIAL HISTORY:  reports that he quit smoking about 2 months ago. His smoking use included Cigarettes. He has a 64 pack-year smoking history. He has never used smokeless tobacco. He reports that he drinks about 3.6 oz of alcohol per week. He  reports that he does not use illicit drugs.  REVIEW OF SYSTEMS:  Unable  SUBJECTIVE:   VITAL SIGNS: Temp:  [97.3 F (36.3 C)-98.4 F (36.9 C)] 97.6 F (36.4 C) (05/12 2321) Pulse Rate:  [32-117] 68 (05/13 0201) Resp:  [12-40] 12 (05/13 0201) BP: (57-117)/(41-85) 57/41 mmHg (05/13 0201) SpO2:  [79 %-100 %] 79 % (05/13 0201) FiO2 (%):  [50 %-100 %] 50 % (05/13 0240) HEMODYNAMICS:   VENTILATOR SETTINGS: Vent Mode:  [-] PRVC FiO2 (%):  [50 %-100 %] 50 % Set Rate:  [12 bmp-18 bmp] 18 bmp Vt Set:  [640 mL] 640 mL PEEP:  [5 cmH20] 5 cmH20 INTAKE /  OUTPUT:  Intake/Output Summary (Last 24 hours) at 09/22/14 0241 Last data filed at 09/21/14 2300  Gross per 24 hour  Intake   3760 ml  Output    315 ml  Net   3445 ml    PHYSICAL EXAMINATION: General:  Elderly male on vent Neuro:  Obtunded on vent with no sedation. PERRL, cough/gag intact HEENT:  [place]/AT, no JVD noted Cardiovascular: Tachy, irregular Lungs:  Coarse bilateral breath sounds R > L Abdomen:  Soft, distended, fluctuant ascites, hyperactive BS Musculoskeletal:  No acute deformity, peripheral pulses intact Skin:  + 4 pitting edema to BLE  LABS:  CBC  Recent Labs Lab 09/20/14 0335 09/21/14 0851 09/22/14 0206  WBC 22.9* 14.8* 15.4*  HGB 10.1* 10.5* 11.1*  HCT 36.1* 36.4* 38.4*  PLT 226 168 149*   Coag's  Recent Labs Lab 09/19/14 0410  09/21/14 0105 09/21/14 0725 09/21/14 1357  APTT 60*  --   --   --   --   INR >10.00*  < > >10.00* 8.41* 5.66*  < > = values in this interval not displayed. BMET  Recent Labs Lab 09/20/14 0335 09/20/14 0930 09/21/14 0725  NA 136 136 137  K 6.6* 5.9* 5.6*  CL 99* 100* 100*  CO2 18* 23 21*  BUN 68* 73* 76*  CREATININE 3.63* 3.79* 3.85*  GLUCOSE 47* 116* 72   Electrolytes  Recent Labs Lab 09/20/14 0335 09/20/14 0930 09/21/14 0725  CALCIUM 8.6* 8.7* 8.2*   Sepsis Markers  Recent Labs Lab 09/19/14 0216 09/19/14 0410 09/20/14 0930 09/21/14 0851  LATICACIDVEN  --  2.5* 3.4* 2.5*  PROCALCITON 0.26  --   --   --    ABG  Recent Labs Lab 09/20/14 0222 09/22/14 0225  PHART 7.304* 7.251*  PCO2ART 33.0* 29.0*  PO2ART 99.9 328*   Liver Enzymes  Recent Labs Lab 10/09/2014 2159 09/19/14 0410  AST 143* 127*  ALT 153* 145*  ALKPHOS 143* 122  BILITOT 2.0* 1.8*  ALBUMIN 2.9* 2.5*   Cardiac Enzymes No results for input(s): TROPONINI, PROBNP in the last 168 hours. Glucose  Recent Labs Lab 09/20/14 0431 09/20/14 0452 09/20/14 0542 09/20/14 0618 09/20/14 0700 09/20/14 1135  GLUCAP 51* 144* 113*  138* 132* 98    Imaging No results found.   ASSESSMENT / PLAN:  PULMONARY OETT 5/13 >>> A: Acute hypoxemic respiratory failure COPD without acute exacerbation Bilateral effusions R>L Empyema? Pulmonary Edema ? HCAP  P:   Full vent support CXR for ett placement ABG BD's to nebs VAP bundle  CARDIOVASCULAR CVL 5/13 >>> A:  Shock - septic vs cardiogenic AF RVR Acute on chronic systolic/diastolic CHF  P:  Telemetry monitoring MAP goal > 65 mm/Hg Phenylephrine to maintain MAP goal Insert CVL once INR more normalized CVP monitoring, goal > 10 Repeat lactic Echo Hold coreg  RENAL A:   Acute on CKD Hyperkalemia (6.1 at time of deterioration)  P:   Follow Bmet Insulin + D50 Bicarb 1 amp EKG Repeat K  Check ammonia  GASTROINTESTINAL A:   Transaminitis, ? Shock liver Colitis H/o recent ileus  P:   Follow LFT Insert OGT to low intermittent suction NPO SUP: IV protonix  HEMATOLOGIC A:   Coumadin coagulopathy Anemia, appears chronic  P:  Follow CBC Hold coumadin Continue vitamin K Follow Coags  INFECTIOUS A:   Septic Shock 2nd to PNA/Colitis  P:   BCx2 5/9 >>> UC 5/10 neg Cdif 5/11 > neg Sputum 5/13 >>> Abx: Zosyn, start date 5/10 >>> Abx: Vancomycin, start date 5/10 >>>  ENDOCRINE A:   No acute issues  P:   Follow glucose on Bmet Add SSI if consistently greater than 180.  NEUROLOGIC A:   Acute metabolic encephalopathy Dementia  P:   RASS goal: -1 PRN fentanyl for sedation DC remeron Monitor   FAMILY  - Updates: Long discussion with wife. Expressed to her that he is critically ill and is requiring multiple forms of life support. She voiced an understanding of this. She states that he is tired of being sick and would not want to continue living in a debilitated state. She supports continuing aggressive medical therapies, but would prefer to no put him through and CPR/ACLS should he suffer a cardiac arrest.   -  Inter-disciplinary family meet or Palliative Care meeting due by:  09/29/2014   TODAY'S SUMMARY:    Joneen Roach, AGACNP-BC [facility]/Critical Care Pager [phone] or [phone]  09/22/2014 3:37 AM  Reviewed above, examined.  74 yo male was recently in hospital for Rt hip fx managed conservatively.  He developed PE and a fib with RVR.  He was in NH, but sent to [facility] with constipation, weakness, edema, shortness of breath.  He was found to have sepsis, colitis, PNA, AKI, acute systolic CHF and supratherapeutic INR.  He was admitted by hospitalist.  He was started on Abx.  He developed progressive respiratory distress and lactic acidosis.  He subsequently required intubation.  He is sedated, pupils reactive, irregular heart rate, b/l crackles at bases LT > RT, abd soft, 2+ edema.  Labs, CXR, ECG from 5/13 reviewed.  Continue full vent support for now.  Continue current Abx pending cx results.  Monitor hemodynamics.  F/u Echo.  F/u INR, BMET.  Diuresis once hemodynamics stable.  Continue scheduled BDs.  Updated pt's wife at bedside.  Confirmed goals of care >> continue current medical therapy; no CPR, no defibrillation in the event of cardiac arrest.  CC time by me independent of APP time is 40 minutes.  Coralyn Helling, MD [facility] Pulmonary/Critical Care 09/22/2014, 9:13 AM Pager:  [phone] After 3pm call: [phone]

## 2014-09-22 NOTE — Anesthesia Procedure Notes (Signed)
Procedure Name: Intubation Date/Time: 09/22/2014 1:57 AM Performed by: Jarvis NewcomerARMISTEAD, Zohair Epp A Pre-anesthesia Checklist: Patient identified, Emergency Drugs available, Suction available, Patient being monitored and Timeout performed Patient Re-evaluated:Patient Re-evaluated prior to inductionOxygen Delivery Method: Ambu bag Preoxygenation: Pre-oxygenation with 100% oxygen Intubation Type: Cricoid Pressure applied and IV induction Ventilation: Mask ventilation without difficulty Laryngoscope Size: Mac and 4 Grade View: Grade I Tube type: Subglottic suction tube Tube size: 7.5 mm Number of attempts: 1 Airway Equipment and Method: Stylet Placement Confirmation: ETT inserted through vocal cords under direct vision,  CO2 detector and breath sounds checked- equal and bilateral Secured at: 22 cm Tube secured with: Tape Dental Injury: Teeth and Oropharynx as per pre-operative assessment  Comments: Called emergently to patient's room. Pt unresponsive upon arrive, sats in the low 40s. RT present and bagging. E-link MD present by video. Instruction to intubate. K+ yesterday was 5.8. PreO2 with 100% O2 via ambu bag. Upper and lower implants removed by RN. Grade 1 view with MAC 4. +ETCO2 per Easy cap. BBS+ PCXR pending. Placed on vent by RT. Sats slowly increased to 97%

## 2014-09-22 NOTE — Progress Notes (Signed)
eLink Physician-Brief Progress Note Patient Name: Nathan KellMaurice W [name] DOB: 09/21/1940 MRN: [id number]   Date of Service  09/22/2014  HPI/Events of Note  cxr review - cvl in place  eICU Interventions  Ok to use cvl     Intervention Category Intermediate Interventions: Diagnostic test evaluation  Sharren Schnurr 09/22/2014, 5:06 AM

## 2014-09-22 NOTE — Progress Notes (Signed)
CRITICAL VALUE ALERT  Critical value received:  Lactic Acid 5.6  Date of notification:  09-22-2014  Time of notification:  0300  Critical value read back:Yes.    Nurse who received alert:  Edison NasutiScott D Nygeria Lager  MD notified (1st page):  Joneen RoachPaul Hoffman  Time of first page:  0300  MD notified (2nd page): N/A  Time of second page: N/A  Responding MD:  Joneen RoachPaul Hoffman  Time MD responded:  0300

## 2014-09-22 NOTE — Progress Notes (Signed)
Initial Nutrition Assessment  DOCUMENTATION CODES:  Non-severe (moderate) malnutrition in context of chronic illness  INTERVENTION:  Initiate Vital AF 1.2 @ 20 ml/hr via OGT and increase by 10 ml every 6 hours to goal rate of 60 ml/hr.   Tube feeding regimen provides 1728 kcal (104% of needs), 108 grams of protein, and 1166 ml of H2O.   NUTRITION DIAGNOSIS:  Inadequate oral intake related to inability to eat as evidenced by NPO status.  GOAL:  Patient will meet greater than or equal to 90% of their needs  MONITOR:  Vent status, TF tolerance, Diet advancement, Labs, Weight trends  REASON FOR ASSESSMENT:  Consult Enteral/tube feeding initiation and management  ASSESSMENT: 74 year old male presented to ED 5/9 c/o constipation x 3 days. Admitted for sepsis 2nd to colitis/HCAP. Initially with elevated lactic. AF RVR on coumadin. INR > 10 at presentation. Had been improving until 5/12 early am with rapid deterioration with obtundation, bradycardia, and hypoxia (50% O2 sat). Intubated by CRNA for respiratory insufficiency.  - Consult received for TF initiation and management.  - Spoke with family member in room. Pt had not been eating for 4 days prior to admission. Wt loss suspected, but unknown amount.  - Pt intubated 5/13. - Pt is +6.8 L since admission. Admission weight used for calculations (162 lb). Current weight 188 lb.   Patient is currently intubated on ventilator support MV: 10.3 L/min Temp (24hrs), Avg:97.5 F (36.4 C), Min:97 F (36.1 C), Max:97.7 F (36.5 C)  Propofol: none  Labs and medications reviewed  BUN 85  Creatinine 3.97  Ca 7.8  Albumin 2.1  AST 385  ALT 496  Total Protein 5.3  Height:  Ht Readings from Last 1 Encounters:  09/22/14 6\' 1"  (1.854 m)    Weight:  Wt Readings from Last 1 Encounters:  09/19/14 188 lb 0.8 oz (85.3 kg)    Ideal Body Weight:  83.6 kg (Calculated using admission wt)  Wt Readings from Last 10 Encounters:   09/19/14 188 lb 0.8 oz (85.3 kg)  08/23/14 162 lb (73.483 kg)  08/14/14 156 lb 6.4 oz (70.943 kg)  08/11/14 157 lb 12.8 oz (71.578 kg)  07/31/14 167 lb 1.6 oz (75.796 kg)  07/11/14 165 lb (74.844 kg)  06/23/14 186 lb (84.369 kg)  06/20/14 200 lb (90.719 kg)  06/18/14 187 lb 12.8 oz (85.186 kg)  06/06/14 183 lb (83.008 kg)    BMI:  Body mass index is 24.82 kg/(m^2).  Estimated Nutritional Needs:  Kcal:  1650  Protein:  110-115 g  Fluid:  1.7 L/day  Skin:  Reviewed, no issues  Diet Order:  Diet NPO time specified  EDUCATION NEEDS:  Education needs no appropriate at this time   Intake/Output Summary (Last 24 hours) at 09/22/14 1306 Last data filed at 09/22/14 0807  Gross per 24 hour  Intake 3670.92 ml  Output    545 ml  Net 3125.92 ml    Last BM:  5/13  Emmaline KluverHaley Rashidi Loh MS, RD, LDN [phone]

## 2014-09-23 ENCOUNTER — Inpatient Hospital Stay (HOSPITAL_COMMUNITY): Payer: Medicare PPO

## 2014-09-23 DIAGNOSIS — N170 Acute kidney failure with tubular necrosis: Secondary | ICD-10-CM

## 2014-09-23 DIAGNOSIS — J9601 Acute respiratory failure with hypoxia: Secondary | ICD-10-CM

## 2014-09-23 DIAGNOSIS — R579 Shock, unspecified: Secondary | ICD-10-CM

## 2014-09-23 LAB — COMPREHENSIVE METABOLIC PANEL
ALK PHOS: 79 U/L (ref 38–126)
ALT: 375 U/L — AB (ref 17–63)
AST: 226 U/L — AB (ref 15–41)
Albumin: 1.8 g/dL — ABNORMAL LOW (ref 3.5–5.0)
Anion gap: 9 (ref 5–15)
BUN: 84 mg/dL — AB (ref 6–20)
CALCIUM: 7.4 mg/dL — AB (ref 8.9–10.3)
CO2: 22 mmol/L (ref 22–32)
Chloride: 111 mmol/L (ref 101–111)
Creatinine, Ser: 3.47 mg/dL — ABNORMAL HIGH (ref 0.61–1.24)
GFR calc Af Amer: 18 mL/min — ABNORMAL LOW (ref >60)
GFR, EST NON AFRICAN AMERICAN: 16 mL/min — AB (ref >60)
Glucose, Bld: 119 mg/dL — ABNORMAL HIGH (ref 65–99)
Potassium: 3 mmol/L — ABNORMAL LOW (ref 3.5–5.1)
SODIUM: 142 mmol/L (ref 135–145)
TOTAL PROTEIN: 4.6 g/dL — AB (ref 6.5–8.1)
Total Bilirubin: 3 mg/dL — ABNORMAL HIGH (ref 0.3–1.2)

## 2014-09-23 LAB — GLUCOSE, CAPILLARY
GLUCOSE-CAPILLARY: 104 mg/dL — AB (ref 65–99)
Glucose-Capillary: 114 mg/dL — ABNORMAL HIGH (ref 65–99)
Glucose-Capillary: 116 mg/dL — ABNORMAL HIGH (ref 65–99)
Glucose-Capillary: 122 mg/dL — ABNORMAL HIGH (ref 65–99)
Glucose-Capillary: 75 mg/dL (ref 65–99)
Glucose-Capillary: 98 mg/dL (ref 65–99)

## 2014-09-23 LAB — CBC
HCT: 30 % — ABNORMAL LOW (ref 39.0–52.0)
Hemoglobin: 9.2 g/dL — ABNORMAL LOW (ref 13.0–17.0)
MCH: 24.7 pg — AB (ref 26.0–34.0)
MCHC: 30.7 g/dL (ref 30.0–36.0)
MCV: 80.6 fL (ref 78.0–100.0)
Platelets: 92 10*3/uL — ABNORMAL LOW (ref 150–400)
RBC: 3.72 MIL/uL — ABNORMAL LOW (ref 4.22–5.81)
RDW: 20.2 % — ABNORMAL HIGH (ref 11.5–15.5)
WBC: 7.7 10*3/uL (ref 4.0–10.5)

## 2014-09-23 LAB — BLOOD GAS, ARTERIAL
Acid-base deficit: 3 mmol/L — ABNORMAL HIGH (ref 0.0–2.0)
Bicarbonate: 19.4 mEq/L — ABNORMAL LOW (ref 20.0–24.0)
Drawn by: 308601
FIO2: 0.4 %
MECHVT: 640 mL
O2 SAT: 99.1 %
PATIENT TEMPERATURE: 35.3
PEEP: 5 cmH2O
PH ART: 7.498 — AB (ref 7.350–7.450)
PO2 ART: 122 mmHg — AB (ref 80.0–100.0)
RATE: 16 resp/min
TCO2: 18 mmol/L (ref 0–100)
pCO2 arterial: 24.7 mmHg — ABNORMAL LOW (ref 35.0–45.0)

## 2014-09-23 LAB — PROTIME-INR
INR: 2.66 — ABNORMAL HIGH (ref 0.00–1.49)
Prothrombin Time: 28.6 seconds — ABNORMAL HIGH (ref 11.6–15.2)

## 2014-09-23 LAB — VANCOMYCIN, TROUGH: Vancomycin Tr: 19 ug/mL (ref 10.0–20.0)

## 2014-09-23 LAB — PROCALCITONIN: PROCALCITONIN: 0.69 ng/mL

## 2014-09-23 LAB — LACTIC ACID, PLASMA: Lactic Acid, Venous: 1.7 mmol/L (ref 0.5–2.0)

## 2014-09-23 LAB — MAGNESIUM: MAGNESIUM: 1.9 mg/dL (ref 1.7–2.4)

## 2014-09-23 LAB — PHOSPHORUS: Phosphorus: 3.9 mg/dL (ref 2.5–4.6)

## 2014-09-23 MED ORDER — PIPERACILLIN-TAZOBACTAM 3.375 G IVPB
3.3750 g | Freq: Three times a day (TID) | INTRAVENOUS | Status: DC
  Administered 2014-09-23 – 2014-09-25 (×5): 3.375 g via INTRAVENOUS
  Filled 2014-09-23 (×5): qty 50

## 2014-09-23 MED ORDER — VANCOMYCIN HCL 10 G IV SOLR
1250.0000 mg | Freq: Once | INTRAVENOUS | Status: AC
  Administered 2014-09-23: 1250 mg via INTRAVENOUS
  Filled 2014-09-23: qty 1250

## 2014-09-23 MED ORDER — POTASSIUM CHLORIDE 20 MEQ/15ML (10%) PO SOLN
40.0000 meq | Freq: Once | ORAL | Status: AC
  Administered 2014-09-23: 40 meq
  Filled 2014-09-23: qty 30

## 2014-09-23 MED ORDER — PHENYLEPHRINE HCL 10 MG/ML IJ SOLN
30.0000 ug/min | INTRAVENOUS | Status: DC
  Administered 2014-09-23: 60 ug/min via INTRAVENOUS
  Administered 2014-09-23: 160 ug/min via INTRAVENOUS
  Administered 2014-09-23: 50 ug/min via INTRAVENOUS
  Filled 2014-09-23 (×3): qty 2

## 2014-09-23 NOTE — Progress Notes (Signed)
PHARMACIST - PHYSICIAN COMMUNICATION CONCERNING:  Phenylephrine  RN has requested phenylephrine medication be increased to double strength.  Pharmacy has re-entered order.  RN aware to update Alaris pump settings.   Haynes Hoehnolleen Justice Milliron, PharmD, BCPS 09/23/2014, 11:58 AM  Pager: [phone]

## 2014-09-23 NOTE — Progress Notes (Signed)
ANTIBIOTIC CONSULT NOTE - FOLLOW UP  Pharmacy Consult for Vancomycin/Zosyn Indication: Sepsis  No Known Allergies  Patient Measurements: Height: 6\' 1"  (185.4 cm) Weight: 202 lb 6.1 oz (91.8 kg) IBW/kg (Calculated) : 79.9  Vital Signs: Temp: 98.1 F (36.7 C) (05/14 0707) Temp Source: Core (Comment) (05/14 0707) BP: 83/46 mmHg (05/14 0700) Pulse Rate: 84 (05/14 0707) Intake/Output from previous day: 05/13 0701 - 05/14 0700 In: 3249.5 [I.V.:2478.5; NG/GT:571; IV Piggyback:200] Out: 1465 [Urine:1465] Intake/Output from this shift:    Labs:  Recent Labs  09/22/14 0206 09/22/14 0530 09/23/14 0405  WBC 15.4* 12.1* 7.7  HGB 11.1* 9.6* 9.2*  PLT 149* 125* 92*  CREATININE 3.94* 3.97* 3.47*   Estimated Creatinine Clearance: 20.8 mL/min (by C-G formula based on Cr of 3.47).  Recent Labs  09/21/14 0105 09/23/14 0405  VANCOTROUGH  --  19  VANCORANDOM 16  --      Microbiology: Recent Results (from the past 720 hour(s))  Blood Culture (routine x 2)     Status: None (Preliminary result)   Collection Time: 09/19/14 12:15 AM  Result Value Ref Range Status   Specimen Description BLOOD RIGHT ANTECUBITAL  Final   Special Requests BOTTLES DRAWN AEROBIC AND ANAEROBIC 5ML  Final   Culture   Final           BLOOD CULTURE RECEIVED NO GROWTH TO DATE CULTURE WILL BE HELD FOR 5 DAYS BEFORE ISSUING A FINAL NEGATIVE REPORT Performed at [facility]    Report Status PENDING  Incomplete  Blood Culture (routine x 2)     Status: None (Preliminary result)   Collection Time: 09/19/14 12:16 AM  Result Value Ref Range Status   Specimen Description BLOOD L FOREARM  Final   Special Requests BOTTLES DRAWN AEROBIC AND ANAEROBIC 5ML  Final   Culture   Final           BLOOD CULTURE RECEIVED NO GROWTH TO DATE CULTURE WILL BE HELD FOR 5 DAYS BEFORE ISSUING A FINAL NEGATIVE REPORT Performed at [facility]    Report Status PENDING  Incomplete  Urine culture     Status: None   Collection Time: 09/19/14  6:52 AM  Result Value Ref Range Status   Specimen Description URINE, RANDOM  Final   Special Requests NONE  Final   Colony Count NO GROWTH Performed at [facility]   Final   Culture NO GROWTH Performed at [facility]   Final   Report Status 09/20/2014 FINAL  Final  MRSA PCR Screening     Status: None   Collection Time: 09/19/14  1:30 PM  Result Value Ref Range Status   MRSA by PCR NEGATIVE NEGATIVE Final    Comment:        The GeneXpert MRSA Assay (FDA approved for NASAL specimens only), is one component of a comprehensive MRSA colonization surveillance program. It is not intended to diagnose MRSA infection nor to guide or monitor treatment for MRSA infections.   Clostridium Difficile by PCR     Status: None   Collection Time: 09/20/14  4:35 PM  Result Value Ref Range Status   C difficile by pcr NEGATIVE NEGATIVE Final    Anti-infectives    Start     Dose/Rate Route Frequency Ordered Stop   09/21/14 0245  vancomycin (VANCOCIN) IVPB 1000 mg/200 mL premix     1,000 mg 200 mL/hr over 60 Minutes Intravenous  Once 09/21/14 0236 09/21/14 0432   09/20/14 1600  vancomycin (  VANCOCIN) 50 mg/mL oral solution 500 mg  Status:  Discontinued     500 mg Oral 4 times per day 09/20/14 1357 09/21/14 1305   09/20/14 1600  metroNIDAZOLE (FLAGYL) IVPB 500 mg  Status:  Discontinued     500 mg 100 mL/hr over 60 Minutes Intravenous Every 8 hours 09/20/14 1357 09/21/14 1305   09/20/14 0900  piperacillin-tazobactam (ZOSYN) IVPB 2.25 g     2.25 g 100 mL/hr over 30 Minutes Intravenous Every 6 hours 09/20/14 0823     09/19/14 2359  vancomycin (VANCOCIN) IVPB 750 mg/150 ml premix  Status:  Discontinued     750 mg 150 mL/hr over 60 Minutes Intravenous Every 24 hours 09/19/14 0044 09/20/14 0821   09/19/14 2200  ceFEPIme (MAXIPIME) 1 g in dextrose 5 % 50 mL IVPB  Status:  Discontinued     1 g 100 mL/hr over 30 Minutes Intravenous Every 24 hours 09/19/14  0042 09/19/14 0240   09/19/14 0800  piperacillin-tazobactam (ZOSYN) IVPB 3.375 g  Status:  Discontinued     3.375 g 12.5 mL/hr over 240 Minutes Intravenous Every 8 hours 09/19/14 0243 09/20/14 0821   09/10/2014 2345  ceFEPIme (MAXIPIME) 2 g in dextrose 5 % 50 mL IVPB     2 g 100 mL/hr over 30 Minutes Intravenous  Once 09/14/2014 2340 09/19/14 0049   09/16/2014 2345  vancomycin (VANCOCIN) IVPB 1000 mg/200 mL premix     1,000 mg 200 mL/hr over 60 Minutes Intravenous  Once 09/12/2014 2340 09/19/14 0209     Assessment 73 yoM admitted 5/9 with hx COPD and PE c/o constipation x 5 days and bilateral leg swelling x 3 weeks.  Zosyn and Vancomycin per Rx for sepsis from HCAP/colitis.  Despite improvements in fevers/WBC, developed progressive respiratory distress and lactic acidosis requiring intubation.  5/9 >> cefepime >> 5/9 5/9 >> vancomycin  >>   5/10 >> zosyn >> 5/11 >> flagyl/PO vanc (r/o C.diff) >> 5/12  Temp: Afebrile WBC: improved to wnl Renal: AKI, improving; CrCl now 21 CG, UOP improved to 0.7 ml/kg/hr  5/10 bcx x2: ngtd 5/10 ucx: neg FINAL 5/10 MRSA PCR ( -) 5/11 cdiff ( - )  5/9 CT: small BL pleural effusion with consolidation consistent with RLL PNA 5/13 CXR: suggestion of developing infiltrate LML, no mention of RLL consolidation  Dose changes/levels: 5/12 at 0100 VR: 16 (after 1 g on 5/10 and 750 mg on 5/11) >> 1g 5/14 at 0500 VR: 19 (after 1 g on 5/12 at 0330)   Goal of Therapy:   Vancomycin trough level 15-20 mcg/ml   Eradication of infection  Appropriate antibiotic dosing for indication and renal function  Plan:  Day 5 of antibiotics  Increase Zosyn back to 3.375 g IV q8 hr given by extended infusion.  With normalized lactate and improved UOP, I expect renal function to continue to recover  Give vancomycin 1250 mg x 1; recheck level in 48 hr.  If renal function continues to improve, may consider giving additional 500-750 mg vancomycin tomorrow  Follow clinical  course, renal function, culture results as available  Follow for de-escalation of antibiotics and LOT   Bernadene Personrew Tashia Leiterman, PharmD Pager: [phone] 09/23/2014, 7:49 AM

## 2014-09-23 NOTE — Progress Notes (Signed)
[facility] FOR AM LAB REPLACEMENT ONLY  The patient does not apply for the [facility] Electrolyte Replacment Protocol based on the criteria listed below:     Is BUN < 60 mg/dL? No.  Patient's BUN today is 84 Abnormal electrolyte(s): K3.0   If a panic level lab has been reported, has the CCM MD in charge been notified? Yes.   Physician:  E Deterding,MD  Melrose NakayamaChisholm, Fardeen Steinberger William 09/23/2014 6:04 AM

## 2014-09-23 NOTE — Progress Notes (Signed)
eLink Physician-Brief Progress Note Patient Name: Nathan KellMaurice W [name] DOB: 08/31/1940 MRN: [id number]   Date of Service  09/23/2014  HPI/Events of Note  Hypokalemia in the setting of renal failure  eICU Interventions  Potassium replaced 40 mEq x 1     Intervention Category Intermediate Interventions: Electrolyte abnormality - evaluation and management  Pierre Cumpton 09/23/2014, 6:03 AM

## 2014-09-23 NOTE — Consult Note (Addendum)
PULMONARY / CRITICAL CARE MEDICINE   Name: Nathan [name] MRN: [id number] DOB: 09/09/1940    ADMISSION DATE:  10/04/2014 CONSULTATION DATE:  09/22/2014  REFERRING MD :  Rito EhrlichKrishnan  CHIEF COMPLAINT:  Respiratory failure  INITIAL PRESENTATION: 74 year old male with multiple medical problems as listed below, which are significant for, PAF on coumadin, GOLD C COPD, CAD, HTN, systolic/diastolic CHF, CKD III, and PE. He was recently admitted for hip fracture, which was to be managed conservatively. During that admission he was found to have pulmonary embolism and started on heprain >> coumadin. Also developed Atrial fibrillation. He was moved to ICU as a result of these things and required BiPAP also had ileus. Had improved and was sent home.   5/9 [facility] complaining of constipation x 3 days, fever, chills. He was suspected to have colitis and area of consolidation on CXR. Elevated lactic as well. He was admitted to hospitalist team for sepsis secondary to colitis/PNA. He also developed atrial fibrillation with rapid ventricular response. He was not attempted on rate control medications besides his home beta blockers. INR was elevated at > 10 at time of admission, got vitamin K. Despite IV antibiotics and fluid boluses he remained tachycardic and hypotensive. Lactic acid had gone down, but had never entirely cleared. 5/12 early AM he developed bradycardia, hypoxia with sats around 50, and became obtunded. He was intubated by CRNA. PCCM called for consultation.    has a past medical history of Aorto-iliac disease; CAD (coronary artery disease); Hypertension; ED (erectile dysfunction); COPD, severe; Hyperlipidemia; Stroke; GERD (gastroesophageal reflux disease); Gout; Paroxysmal atrial flutter; Chronic combined systolic and diastolic CHF (congestive heart failure); PAF (paroxysmal atrial fibrillation); CKD (chronic kidney disease), stage III; Tobacco abuse; History of noncompliance with  medical treatment; Carotid artery disease; Anemia; Lower GI bleed; Mitral regurgitation; Hypertensive heart disease; and PE (pulmonary embolism).   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   STUDIES:  Admission CXR > bilateral effusions R>L. 09/25/2014 5/9 CT abd > Small bilateral pleural effusions greater on the right. Consolidation in the right lower lung suggesting pneumonia. Small amount of abdominal and pelvic free fluid. Increased density suggesting sludge versus milk of calcium in the gallbladder. Mild stranding around the sigmoid colon suggesting early diverticulitis.Left inguinal hernia containing fat and fluid. 5/9 admitted for sepsis/colitis/HCAP 5/13 rapid deterioration, intubated. PCCM primary  SUBJECTIVE:  09/23/2014: Hx from RN -> on 80 neo. On 75 fent gtt -> 40% fio2 -> RASS -2   (per son hx of substance abuse). Agitated on WUA yesterday. Baseline 3+ edema. Plat dropped to 90K but creat, lft, inr all improving.  RN saying knees red but they look ok to MD. [place]   VITAL SIGNS: Temp:  [93.7 F (34.3 C)-98.6 F (37 C)] 98.2 F (36.8 C) (05/14 1030) Pulse Rate:  [72-94] 83 (05/14 1030) Resp:  [14-22] 16 (05/14 1030) BP: (76-121)/(40-79) 98/53 mmHg (05/14 1030) SpO2:  [100 %] 100 % (05/14 1030) FiO2 (%):  [40 %] 40 % (05/14 0800) Weight:  [91.8 kg (202 lb 6.1 oz)] 91.8 kg (202 lb 6.1 oz) (05/14 0500) HEMODYNAMICS: CVP:  [6 mmHg-16 mmHg] 8 mmHg VENTILATOR SETTINGS: Vent Mode:  [-] PRVC FiO2 (%):  [40 %] 40 %  Set Rate:  [16 bmp] 16 bmp Vt Set:  [640 mL] 640 mL PEEP:  [5 cmH20] 5 cmH20 Plateau Pressure:  [15 cmH20-27 cmH20] 27  cmH20 INTAKE / OUTPUT:  Intake/Output Summary (Last 24 hours) at 09/23/14 1114 Last data filed at 09/23/14 1100  Gross per 24 hour  Intake 4116.88 ml  Output   1430 ml  Net 2686.88 ml    PHYSICAL EXAMINATION: General:  Elderly male on vent Neuro:  Obtunded on vent with fent RASS -3. PERRL, cough/gag intact HEENT:  /AT, no JVD noted Cardiovascular: Tachy, irregular Lungs:  Coarse bilateral breath sounds R > L Abdomen:  Soft, distended, fluctuant ascites, hyperactive BS Musculoskeletal:  No acute deformity, peripheral pulses intact Skin:  + 4 pitting edema to BLE  LABS:  PULMONARY  Recent Labs Lab 09/20/14 0222 09/22/14 0225 09/22/14 0445 09/23/14 0355  PHART 7.304* 7.251* 7.371 7.498*  PCO2ART 33.0* 29.0* 24.6* 24.7*  PO2ART 99.9 328* 213* 122*  HCO3 16.2* 12.3* 13.9* 19.4*  TCO2 15.3 11.7 13.0 18.0  O2SAT 96.7 99.7 99.7 99.1    CBC  Recent Labs Lab 09/22/14 0206 09/22/14 0530 09/23/14 0405  HGB 11.1* 9.6* 9.2*  HCT 38.4* 32.2* 30.0*  WBC 15.4* 12.1* 7.7  PLT 149* 125* 92*    COAGULATION  Recent Labs Lab 09/21/14 0725 09/21/14 1357 09/22/14 0206 09/22/14 0530 09/23/14 0405  INR 8.41* 5.66* 4.41* 4.36* 2.66*    CARDIAC   Recent Labs Lab 09/22/14 0206 09/22/14 1130 09/22/14 1826  TROPONINI 0.08* 0.08* 0.08*   No results for input(s): PROBNP in the last 168 hours.   CHEMISTRY  Recent Labs Lab 09/20/14 0930 09/21/14 0725 09/22/14 0206 09/22/14 0215 09/22/14 0530 09/22/14 0744 09/23/14 0405  NA 136 137 139  --  138  --  142  K 5.9* 5.6* 6.1*  --  4.7 4.5 3.0*  CL 100* 100* 108  --  104  --  111  CO2 23 21* 15*  --  18*  --  22  GLUCOSE 116* 72 47*  --  109*  --  119*  BUN 73* 76* 86*  --  85*  --  84*  CREATININE 3.79* 3.85* 3.94*  --  3.97*  --  3.47*  CALCIUM 8.7* 8.2* 8.3*  --  7.8*  --  7.4*  MG  --   --   --  2.3  --   --  1.9  PHOS  --   --   --  7.1*  --   --  3.9   Estimated Creatinine Clearance: 20.8 mL/min (by C-G  formula based on Cr of 3.47).   LIVER  Recent Labs Lab Sep 29, 2014 2159 09/19/14 0410  09/21/14 0725 09/21/14 1357 09/22/14 0206 09/22/14 0530 09/23/14 0405  AST 143* 127*  --   --   --  490* 385* 226*  ALT 153* 145*  --   --   --  607* 496* 375*  ALKPHOS 143* 122  --   --   --  110 97 79  BILITOT 2.0* 1.8*  --   --   --  3.0* 3.0* 3.0*  PROT 7.4 6.4*  --   --   --  6.2* 5.3* 4.6*  ALBUMIN 2.9* 2.5*  --   --   --  2.4* 2.1* 1.8*  INR 9.00* >10.00*  < > 8.41* 5.66* 4.41* 4.36* 2.66*  < > = values in this interval not displayed.   INFECTIOUS  Recent Labs Lab 09/19/14 0216  09/22/14 0206 09/22/14 0215 09/22/14 0530 09/23/14 0405  LATICACIDVEN  --   < >  --  5.6* 4.3* 1.7  PROCALCITON 0.26  --  0.69  --   --  0.69  < > = values in this interval not displayed.   ENDOCRINE CBG (last 3)   Recent Labs  09/22/14 1956 09/23/14 0030 09/23/14 0414  GLUCAP 83 114* 75         IMAGING x48h  - one more images have been personally visualized and highlighted in blue Dg Chest Port 1 View  09/23/2014   CLINICAL DATA:  Pneumonia  EXAM: PORTABLE CHEST - 1 VIEW  COMPARISON:  09/22/2014  FINDINGS: Bilateral pleural effusions are stable. Left midlung airspace disease is improved. Tubular device is stable. No pneumothorax.  IMPRESSION: Improved left midlung airspace disease.  Stable bilateral pleural effusions.   Electronically Signed   By: Jolaine ClickArthur  Hoss M.D.   On: 09/23/2014 08:12   Dg Chest Port 1 View  09/22/2014   CLINICAL DATA:  Central line placement  EXAM: PORTABLE CHEST - 1 VIEW  COMPARISON:  09/22/2014  FINDINGS: Endotracheal tube with tip measuring 4.5 cm above the carinal. Enteric tube tip is off the field of view. Left central venous catheter with tip over the upper SVC region. No pneumothorax. Bilateral pleural effusions greater on the right with basilar atelectasis. Suggestion of developing airspace disease in the left mid lung possibly representing pneumonia. Calcified and  tortuous aorta.  IMPRESSION: Appliances appear in satisfactory location. Bilateral pleural effusions, greater on the right. Suggestion of developing infiltration in the left mid lung.   Electronically Signed   By: Burman NievesWilliam  Stevens M.D.   On: 09/22/2014 05:03   Dg Chest Port 1 View  09/22/2014   CLINICAL DATA:  Endotracheal tube placement  EXAM: PORTABLE CHEST - 1 VIEW  COMPARISON:  10/02/2014  FINDINGS: Interval placement of an endotracheal tube with tip measuring 4.1 cm above the carina. Postoperative changes in the mediastinum. Cardiac enlargement with mild pulmonary vascular congestion. Bilateral pleural effusions with basilar atelectasis. Mediastinal contours appear intact. Calcification of aorta.  IMPRESSION: Endotracheal tube appears in satisfactory position. Cardiac enlargement with mild pulmonary vascular congestion. Bilateral pleural effusions and basilar atelectasis.   Electronically Signed   By: Burman NievesWilliam  Stevens M.D.   On: 09/22/2014 02:30        ASSESSMENT / PLAN:  PULMONARY OETT 5/13 >>> A: Acute hypoxemic respiratory failure -v entilator dependent COPD without acute exacerbation Bilateral effusions R>L Empyema? Pulmonary Edema ? HCAP   - 09/23/2014: Does not meet SBT criteria due to septic shock and RASS -3    P:   Full vent support CXR for ett placement ABG BD's to nebs VAP bundle  CARDIOVASCULAR CVL 5/13 >>> A:  Shock - septic vs cardiogenic AF RVR Acute on chronic systolic/diastolic CHF - echo 1/61/095/13/16: ef 45%  The patient appeared to be in atrial fibrillation. Normal LV size with mild LV hypertrophy. EF 45-50% with mild diffusehypokinesis. D-shaped interventricular septum suggests RV pressure/volume overload. Mildly dilated RV with normal systolic function. Moderate TR. Mild pulmonary hypertension. MR appeared severe.    - Still vasopressor dependent  P:  MAP goal > 65 mm/Hg; Phenylephrine to maintain MAP goal Dc CVP monitoring, TEE ordered    RENAL A:    Acute on CKD Hyperkalemia (6.1 at time of deterioration)   - creat better. K repleted by elink but still in ATN  P:   Follow Bmet Keep up bp/hr  GASTROINTESTINAL A:   Transaminitis, ? Shock liver Colitis H/o recent ileus    - tolerating tube feeds. LFT better  P:   Follow LFT Insert OGT to low intermittent suction NPO SUP: IV protonix  HEMATOLOGIC A:   Coumadin coagulopathy Anemia, appears chronic   - INR improved but plat dropped but prob due to sepsis  P:  Follow CBC Hold coumadin Follow Coags and platelet  INFECTIOUS BCx2 5/9 >>> UC 5/10 neg Cdif 5/11 > neg Sputum 5/13 >>> A:   Septic Shock 2nd to PNA/Colitis   - still vasopresor dependent  P:   Abx: Zosyn, start date 5/10 >>> Abx: Vancomycin, start date 5/10 >>>  ENDOCRINE A:   No acute issues  P:   Follow glucose on Bmet Add SSI if consistently greater than 180.  NEUROLOGIC A:   Acute metabolic encephalopathy Dementia   - RASs -3. WUA in progress  P:   RASS goal: -1 PRN fentanyl for sedation Fent gtt - Rn to titrate DC remeron Monitor   CODE    Code Status Orders        Start     Ordered   09/22/14 0404  Limited resuscitation (code)   Continuous    Question Answer Comment  In the event of cardiac or respiratory ARREST: Perform CPR No   In the event of cardiac or respiratory ARREST: Perform Intubation/Mechanical Ventilation Yes   In the event of cardiac or respiratory ARREST: Perform Defibrillation or Cardioversion if indicated No   Antiarrhythmic drugs - Any drug used to treat arrhythmias Yes   Vasoactive drug - Any drug used to stabilize blood pressure Yes      09/22/14 0403    Advance Directive Documentation        Most Recent Value   Type of Advance Directive  Healthcare Power of Attorney   Pre-existing out of facility DNR order (yellow form or pink MOST form)     "MOST" Form in Place?         FAMILY  IDT team done by Dr Craige Cotta / PA  09/22/14 : Long  discussion with wife. Expressed to her that he is critically ill and is requiring multiple forms of life support. She voiced an understanding of this. She states that he is tired of being sick and would not want to continue living in a debilitated state. She supports continuing aggressive medical therapies, but would prefer to no put him through and CPR/ACLS should he suffer a cardiac arrest.   Updates: 09/23/2014 - son updated at bedside     The patient is critically ill with multiple organ systems failure and requires high complexity decision making for assessment and support, frequent evaluation and titration of therapies, application of advanced monitoring technologies and extensive interpretation of multiple databases.   Critical Care Time devoted to patient care services described in this note is  35  Minutes. This time reflects time of care of this signee Dr Kalman Shan. This critical care time does not reflect procedure time, or teaching time or supervisory time of PA/NP/Med student/Med Resident etc but could involve care discussion time    Dr. Kalman Shan, M.D., [facility].C.P Pulmonary and Critical Care Medicine Staff Physician [facility] [facility] Pager: [phone], If no answer or between  15:00h - 7:00h: call [phone]  09/23/2014 11:34 AM

## 2014-09-23 NOTE — Progress Notes (Signed)
ANTICOAGULATION CONSULT NOTE - Follow Up Consult  Pharmacy Consult for coumadin Indication: hx atrial fibrillation  No Known Allergies  Patient Measurements: Height: 6\' 1"  (185.4 cm) Weight: 202 lb 6.1 oz (91.8 kg) IBW/kg (Calculated) : 79.9   Vital Signs: Temp: 98.2 F (36.8 C) (05/14 1215) Temp Source: Core (Comment) (05/14 0800) BP: 73/52 mmHg (05/14 1215) Pulse Rate: 88 (05/14 1215)  Labs:  Recent Labs  09/22/14 0206 09/22/14 0530 09/22/14 1130 09/22/14 1826 09/23/14 0405  HGB 11.1* 9.6*  --   --  9.2*  HCT 38.4* 32.2*  --   --  30.0*  PLT 149* 125*  --   --  92*  LABPROT 42.4* 42.1*  --   --  28.6*  INR 4.41* 4.36*  --   --  2.66*  CREATININE 3.94* 3.97*  --   --  3.47*  TROPONINI 0.08*  --  0.08* 0.08*  --     Estimated Creatinine Clearance: 20.8 mL/min (by C-G formula based on Cr of 3.47).  Assessment: Patient's a 74 y.o M presented to the ED on 5/9 with c/o constipation and emesis. He was on coumadin PTA for afib with INR supratherapeutic at 9 on admit and was given multiple vit K doses for reversal.  Significant events: - 5/10 hemeoccult ( -) - emesis for a few days PTA, rising INR is likely due to poor intake and liver dysfunction - 5/10 Vit K 2.5mg  po x1 - 5/11 Vit K 1mg  SQ x1 and vit K 10 mg SQ x1 - 5/11 at 0230 Vit K 5mg  PO x1 and vit K 10mg  IV x1  Today, 09/21/14:  - INR 2.66 - Hgb dropped but appears to be near baseline; Plt dropped, likely d/t sepsis - cbc stable, no bleeding documented  Goal of Therapy:  INR 2-3   Plan:  Continue to hold coumadin.  INR returned to therapeutic range, but with drop in platelets and overall clinical picture, MD would like to continue to hold anticoagulation for now  Bernadene Personrew Emiah Pellicano, PharmD Pager: [phone] 09/23/2014, 12:43 PM

## 2014-09-24 ENCOUNTER — Inpatient Hospital Stay (HOSPITAL_COMMUNITY): Payer: Medicare PPO

## 2014-09-24 DIAGNOSIS — N189 Chronic kidney disease, unspecified: Secondary | ICD-10-CM

## 2014-09-24 DIAGNOSIS — R609 Edema, unspecified: Secondary | ICD-10-CM

## 2014-09-24 LAB — GLUCOSE, CAPILLARY
GLUCOSE-CAPILLARY: 100 mg/dL — AB (ref 65–99)
GLUCOSE-CAPILLARY: 117 mg/dL — AB (ref 65–99)
GLUCOSE-CAPILLARY: 91 mg/dL (ref 65–99)
Glucose-Capillary: 97 mg/dL (ref 65–99)
Glucose-Capillary: 81 mg/dL (ref 65–99)

## 2014-09-24 LAB — CBC WITH DIFFERENTIAL/PLATELET
BASOS ABS: 0 10*3/uL (ref 0.0–0.1)
Basophils Relative: 0 % (ref 0–1)
Eosinophils Absolute: 0.1 10*3/uL (ref 0.0–0.7)
Eosinophils Relative: 2 % (ref 0–5)
HEMATOCRIT: 32.6 % — AB (ref 39.0–52.0)
Hemoglobin: 9.7 g/dL — ABNORMAL LOW (ref 13.0–17.0)
Lymphocytes Relative: 10 % — ABNORMAL LOW (ref 12–46)
Lymphs Abs: 0.7 10*3/uL (ref 0.7–4.0)
MCH: 24.4 pg — ABNORMAL LOW (ref 26.0–34.0)
MCHC: 29.8 g/dL — AB (ref 30.0–36.0)
MCV: 81.9 fL (ref 78.0–100.0)
MONOS PCT: 7 % (ref 3–12)
Monocytes Absolute: 0.4 10*3/uL (ref 0.1–1.0)
NEUTROS ABS: 5.2 10*3/uL (ref 1.7–7.7)
NEUTROS PCT: 81 % — AB (ref 43–77)
PLATELETS: 68 10*3/uL — AB (ref 150–400)
RBC: 3.98 MIL/uL — ABNORMAL LOW (ref 4.22–5.81)
RDW: 20.3 % — ABNORMAL HIGH (ref 11.5–15.5)
WBC: 6.4 10*3/uL (ref 4.0–10.5)

## 2014-09-24 LAB — BASIC METABOLIC PANEL
ANION GAP: 13 (ref 5–15)
BUN: 77 mg/dL — AB (ref 6–20)
CALCIUM: 7.4 mg/dL — AB (ref 8.9–10.3)
CO2: 21 mmol/L — AB (ref 22–32)
CREATININE: 3.37 mg/dL — AB (ref 0.61–1.24)
Chloride: 108 mmol/L (ref 101–111)
GFR calc Af Amer: 19 mL/min — ABNORMAL LOW (ref >60)
GFR calc non Af Amer: 16 mL/min — ABNORMAL LOW (ref >60)
Glucose, Bld: 112 mg/dL — ABNORMAL HIGH (ref 65–99)
POTASSIUM: 3.1 mmol/L — AB (ref 3.5–5.1)
SODIUM: 142 mmol/L (ref 135–145)

## 2014-09-24 LAB — PROTIME-INR
INR: 1.84 — AB (ref 0.00–1.49)
Prothrombin Time: 21.4 seconds — ABNORMAL HIGH (ref 11.6–15.2)

## 2014-09-24 LAB — MAGNESIUM: MAGNESIUM: 1.9 mg/dL (ref 1.7–2.4)

## 2014-09-24 LAB — CK TOTAL AND CKMB (NOT AT ARMC)
CK, MB: 3.2 ng/mL (ref 0.5–5.0)
Relative Index: INVALID (ref 0.0–2.5)
Total CK: 93 U/L (ref 49–397)

## 2014-09-24 LAB — PHOSPHORUS: PHOSPHORUS: 3.6 mg/dL (ref 2.5–4.6)

## 2014-09-24 LAB — PROCALCITONIN: Procalcitonin: 0.52 ng/mL

## 2014-09-24 MED ORDER — FENTANYL BOLUS VIA INFUSION
50.0000 ug | INTRAVENOUS | Status: DC | PRN
  Filled 2014-09-24: qty 200

## 2014-09-24 MED ORDER — BUDESONIDE 0.5 MG/2ML IN SUSP
0.5000 mg | Freq: Two times a day (BID) | RESPIRATORY_TRACT | Status: DC
  Administered 2014-09-24 – 2014-10-03 (×19): 0.5 mg via RESPIRATORY_TRACT
  Filled 2014-09-24 (×20): qty 2

## 2014-09-24 MED ORDER — VITAMINS A & D EX OINT
TOPICAL_OINTMENT | CUTANEOUS | Status: AC
  Administered 2014-09-24: 1
  Filled 2014-09-24: qty 25

## 2014-09-24 MED ORDER — BUDESONIDE 0.25 MG/2ML IN SUSP: 0.2500 mg | Freq: Two times a day (BID) | RESPIRATORY_TRACT | Status: DC

## 2014-09-24 MED ORDER — FENTANYL CITRATE (PF) 100 MCG/2ML IJ SOLN
50.0000 ug | INTRAMUSCULAR | Status: DC | PRN
  Administered 2014-09-24: 100 ug via INTRAVENOUS
  Administered 2014-09-24: 50 ug via INTRAVENOUS
  Administered 2014-09-25 (×3): 100 ug via INTRAVENOUS
  Filled 2014-09-24 (×5): qty 2

## 2014-09-24 MED ORDER — BUDESONIDE 0.5 MG/2ML IN SUSP
RESPIRATORY_TRACT | Status: AC
  Filled 2014-09-24: qty 2

## 2014-09-24 MED ORDER — MAGNESIUM SULFATE 2 GM/50ML IV SOLN
2.0000 g | Freq: Once | INTRAVENOUS | Status: AC
  Administered 2014-09-24: 2 g via INTRAVENOUS
  Filled 2014-09-24: qty 50

## 2014-09-24 NOTE — Progress Notes (Signed)
*  PRELIMINARY RESULTS* Vascular Ultrasound Lower extremity venous duplex has been completed.  Preliminary findings: Negative for DVT. Venous flow is pulsatile suggesting right sided heart failure.  Unchanged from study on 06/26/14.  Farrel DemarkJill Eunice, RDMS, RVT  09/24/2014, 10:52 AM

## 2014-09-24 NOTE — Progress Notes (Signed)
Wasted fentanyl 150 ml.  Witness Len Blalockenise Aldridge, RN

## 2014-09-24 NOTE — Consult Note (Signed)
PULMONARY / CRITICAL CARE MEDICINE   Name: Nathan [name] MRN: [id number] DOB: 02/28/1941    ADMISSION DATE:  09/13/2014 CONSULTATION DATE:  09/22/2014  REFERRING MD :  Rito EhrlichKrishnan  CHIEF COMPLAINT:  Respiratory failure  INITIAL PRESENTATION: 74 year old male with multiple medical problems as listed below, which are significant for, PAF on coumadin, GOLD C COPD, CAD, HTN, systolic/diastolic CHF, CKD III, and PE. He was recently admitted for hip fracture, which was to be managed conservatively. During that admission he was found to have pulmonary embolism and started on heprain >> coumadin. Also developed Atrial fibrillation. He was moved to ICU as a result of these things and required BiPAP also had ileus. Had improved and was sent home.   5/9 he presented [facility] ED complaining of constipation x 3 days, fever, chills. He was suspected to have colitis and area of consolidation on CXR. Elevated lactic as well. He was admitted to hospitalist team for sepsis secondary to colitis/PNA. He also developed atrial fibrillation with rapid ventricular response. He was not attempted on rate control medications besides his home beta blockers. INR was elevated at > 10 at time of admission, got vitamin K. Despite IV antibiotics and fluid boluses he remained tachycardic and hypotensive. Lactic acid had gone down, but had never entirely cleared. 5/12 early AM he developed bradycardia, hypoxia with sats around 50, and became obtunded. He was intubated by CRNA. PCCM called for consultation.    has a past medical history of Aorto-iliac disease; CAD (coronary artery disease); Hypertension; ED (erectile dysfunction); COPD, severe; Hyperlipidemia; Stroke; GERD (gastroesophageal reflux disease); Gout; Paroxysmal atrial flutter; Chronic combined systolic and diastolic CHF (congestive heart failure); PAF (paroxysmal atrial fibrillation); CKD (chronic kidney disease), stage III; Tobacco abuse; History of noncompliance with  medical treatment; Carotid artery disease; Anemia; Lower GI bleed; Mitral regurgitation; Hypertensive heart disease; and PE (pulmonary embolism).   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   STUDIES:  Admission CXR > bilateral effusions R>L. 09/24/2014 5/9 CT abd > Small bilateral pleural effusions greater on the right. Consolidation in the right lower lung suggesting pneumonia. Small amount of abdominal and pelvic free fluid. Increased density suggesting sludge versus milk of calcium in the gallbladder. Mild stranding around the sigmoid colon suggesting early diverticulitis.Left inguinal hernia containing fat and fluid. 5/9 admitted for sepsis/colitis/HCAP 5/13 rapid deterioration, intubated. PCCM primary  09/23/2014: Hx from RN -> on 80 neo. On 75 fent gtt -> 40% fio2 -> RASS -2   . Agitated on WUA yesterday. Baseline 3+ edema. Plat dropped to 90K but creat, lft, inr all improving.  RN saying knees red but they look ok to MD. [place]   SUBJECTIVE/OVERNIGHT/INTERVAL HX 09/24/2014: Plat dropped further to 68. INR improved. Creat improved. Neo down to 50mcg. Tolerating tube feeds well. Took 12h off fent gtt for him to get arousable yesterday. Currently back on fent gtt at lower dose. Curently intermittently arousable.Failed SBT   VITAL SIGNS: Temp:  [97.9 F (36.6 C)-98.2 F (36.8 C)] 97.9 F (36.6 C) (05/15 0830) Pulse Rate:  [78-106] 100 (05/15 0357) Resp:  [14-31] 16 (05/15 0830) BP: (73-122)/(22-75) 94/59 mmHg (05/15 0830) SpO2:  [100 %] 100 % (05/15 0830) FiO2 (%):  [40 %] 40 % (05/15  0830) Weight:  [95.8 kg (211 lb 3.2 oz)] 95.8 kg (211 lb  3.2 oz) (05/15 0838) HEMODYNAMICS:   VENTILATOR SETTINGS: Vent Mode:  [-] PRVC FiO2 (%):  [40 %] 40 % Set Rate:  [16 bmp] 16 bmp Vt Set:  [640 mL] 640 mL PEEP:  [5 cmH20] 5 cmH20 Plateau Pressure:  [24 cmH20-30 cmH20] 27 cmH20 INTAKE / OUTPUT:  Intake/Output Summary (Last 24 hours) at 09/24/14 1011 Last data filed at 09/24/14 0830  Gross per 24 hour  Intake 4256.67 ml  Output    990 ml  Net 3266.67 ml    PHYSICAL EXAMINATION: General:  Elderly male on vent Neuro: fentgtt ->  RASS -2/-3 (better, eyes open). PERRL, cough/gag intact HEENT:  [place]/AT, no JVD noted Cardiovascular: Tachy, irregular Lungs:  Coarse bilateral breath sounds  Abdomen:  Soft, distended,  Musculoskeletal:  No acute deformity, peripheral pulses intact Skin:  + 4 pitting edema to BLE  LABS:  PULMONARY  Recent Labs Lab 09/20/14 0222 09/22/14 0225 09/22/14 0445 09/23/14 0355  PHART 7.304* 7.251* 7.371 7.498*  PCO2ART 33.0* 29.0* 24.6* 24.7*  PO2ART 99.9 328* 213* 122*  HCO3 16.2* 12.3* 13.9* 19.4*  TCO2 15.3 11.7 13.0 18.0  O2SAT 96.7 99.7 99.7 99.1    CBC  Recent Labs Lab 09/22/14 0530 09/23/14 0405 09/24/14 0500  HGB 9.6* 9.2* 9.7*  HCT 32.2* 30.0* 32.6*  WBC 12.1* 7.7 6.4  PLT 125* 92* 68*    COAGULATION  Recent Labs Lab 09/21/14 1357 09/22/14 0206 09/22/14 0530 09/23/14 0405 09/24/14 0500  INR 5.66* 4.41* 4.36* 2.66* 1.84*    CARDIAC    Recent Labs Lab 09/22/14 0206 09/22/14 1130 09/22/14 1826  TROPONINI 0.08* 0.08* 0.08*   No results for input(s): PROBNP in the last 168 hours.   CHEMISTRY  Recent Labs Lab 09/21/14 0725 09/22/14 0206 09/22/14 0215 09/22/14 0530  09/23/14 0405 09/24/14 0500  NA 137 139  --  138  --  142 142  K 5.6* 6.1*  --  4.7  < > 3.0* 3.1*  CL 100* 108  --  104  --  111 108  CO2 21* 15*  --  18*  --  22 21*  GLUCOSE 72 47*  --  109*  --  119* 112*  BUN 76* 86*  --  85*  --  84* 77*  CREATININE 3.85* 3.94*  --  3.97*  --  3.47*  3.37*  CALCIUM 8.2* 8.3*  --  7.8*  --  7.4* 7.4*  MG  --   --  2.3  --   --  1.9 1.9  PHOS  --   --  7.1*  --   --  3.9 3.6  < > = values in this interval not displayed. Estimated Creatinine Clearance: 21.4 mL/min (by C-G formula based on Cr of 3.37).   LIVER  Recent Labs Lab 09/11/2014 2159 09/19/14 0410  09/21/14 1357 09/22/14 0206 09/22/14 0530 09/23/14 0405 09/24/14 0500  AST 143* 127*  --   --  490* 385* 226*  --   ALT 153* 145*  --   --  607* 496* 375*  --   ALKPHOS 143* 122  --   --  110 97 79  --   BILITOT 2.0* 1.8*  --   --  3.0* 3.0* 3.0*  --   PROT 7.4 6.4*  --   --  6.2* 5.3* 4.6*  --   ALBUMIN 2.9* 2.5*  --   --  2.4*  2.1* 1.8*  --   INR 9.00* >10.00*  < > 5.66* 4.41* 4.36* 2.66* 1.84*  < > = values in this interval not displayed.   INFECTIOUS  Recent Labs Lab 09/19/14 0216  09/22/14 0206 09/22/14 0215 09/22/14 0530 09/23/14 0405  LATICACIDVEN  --   < >  --  5.6* 4.3* 1.7  PROCALCITON 0.26  --  0.69  --   --  0.69  < > = values in this interval not displayed.   ENDOCRINE CBG (last 3)   Recent Labs  09/23/14 2014 09/24/14 0010 09/24/14 0811  GLUCAP 104* 91 117*         IMAGING x48h  - one more images have been personally visualized and highlighted  Dg Chest Port 1 View  09/24/2014   CLINICAL DATA:  Endotracheal tube present.  EXAM: PORTABLE CHEST - 1 VIEW  COMPARISON:  Sep 23, 2014.  FINDINGS: Stable cardiomediastinal silhouette. Status post coronary artery bypass graft. Endotracheal tube is in grossly good position with distal tip 4 cm above the carina. Left internal jugular catheter is unchanged with distal tip overlying expected position of the SVC. Stable bilateral pleural effusions are noted with right greater than left. Stable bilateral lower lobe opacities are also noted. Bony thorax is intact.  IMPRESSION: Stable bilateral lung opacities and pleural effusions. Stable support apparatus.   Electronically Signed   By: Lupita Raider, M.D.    On: 09/24/2014 07:16   Dg Chest Port 1 View  09/23/2014   CLINICAL DATA:  Pneumonia  EXAM: PORTABLE CHEST - 1 VIEW  COMPARISON:  09/22/2014  FINDINGS: Bilateral pleural effusions are stable. Left midlung airspace disease is improved. Tubular device is stable. No pneumothorax.  IMPRESSION: Improved left midlung airspace disease.  Stable bilateral pleural effusions.   Electronically Signed   By: Jolaine Click M.D.   On: 09/23/2014 08:12        ASSESSMENT / PLAN:  PULMONARY OETT 5/13 >>> A: Acute hypoxemic respiratory failure -v entilator dependent COPD without acute exacerbation Bilateral effusions R>L Empyema? Pulmonary Edema ? HCAP   - 09/24/2014: Failed SBT. Does not meet extubation criteria due to septic shock and RASS -3/-2    P:   Full vent support ABG BD's to nebs VAP bundle  CARDIOVASCULAR CVL 5/13 >>> A:  Shock - septic vs cardiogenic AF RVR Acute on chronic systolic/diastolic CHF - echo 1/61/09: ef 45%  The patient appeared to be in atrial fibrillation. Normal LV size with mild LV hypertrophy. EF 45-50% with mild diffusehypokinesis. D-shaped interventricular septum suggests RV pressure/volume overload. Mildly dilated RV with normal systolic function. Moderate TR. Mild pulmonary hypertension. MR appeared severe.    - Still vasopressor dependent but improved  P:  MAP goal > 65 mm/Hg; Phenylephrine to maintain MAP goal Dc CVP monitoring, TEE ordered for assessing Mitral regurg Will need lasix once off pressors   RENAL A:   Acute on CKD with Hyperkalemia at presentation   - creat better. Low mag + , Low K+. Improving ur op  P:   Replete mag Follow Bmet Keep up bp/hr   GASTROINTESTINAL A:   Transaminitis, ? Shock liver Colitis H/o recent ileus    - tolerating tube feeds. LFT better. Mild coffee ground in OG  P:   Follow LFT Monitor coffee ground returns NPO SUP: IV protonix  HEMATOLOGIC A:   Coumadin coagulopathy Anemia, appears  chronic   - INR improved but plat dropped but prob due to sepsis  P:  Follow CBC Hold coumadin Follow Coags and platelet No heparin  INFECTIOUS BCx2 5/9 >>> UC 5/10 neg Cdif 5/11 > neg Sputum 5/13 >>> MRSA PCR -negative A:   Septic Shock 2nd to PNA/Colitis   - still vasopresor dependent but better. Culture negative so far  P:   Abx: Zosyn, start date 5/10 >>> Abx: Vancomycin, start date 5/10 >>>09/24/14  ENDOCRINE A:   No acute issues  P:   Follow glucose on Bmet Add SSI if consistently greater than 180.  NEUROLOGIC A:   Acute metabolic encephalopathy Dementia   - RASs -3/2 some better. Took long time to get arousable after turning fent gtt off 09/23/14  P:   RASS goal: -1 PRN fentanyl for sedation Dc fent ggtt DC remeron Monitor   CODE    Code Status Orders        Start     Ordered   09/22/14 0404  Limited resuscitation (code)   Continuous    Question Answer Comment  In the event of cardiac or respiratory ARREST: Perform CPR No   In the event of cardiac or respiratory ARREST: Perform Intubation/Mechanical Ventilation Yes   In the event of cardiac or respiratory ARREST: Perform Defibrillation or Cardioversion if indicated No   Antiarrhythmic drugs - Any drug used to treat arrhythmias Yes   Vasoactive drug - Any drug used to stabilize blood pressure Yes      09/22/14 0403    Advance Directive Documentation        Most Recent Value   Type of Advance Directive  Healthcare Power of Attorney   Pre-existing out of facility DNR order (yellow form or pink MOST form)     "MOST" Form in Place?         FAMILY  IDT team done by Dr Craige Cotta / PA  09/22/14 : Long discussion with wife. Expressed to her that he is critically ill and is requiring multiple forms of life support. She voiced an understanding of this. She states that he is tired of being sick and would not want to continue living in a debilitated state. She supports continuing aggressive medical  therapies, but would prefer to no put him through and CPR/ACLS should he suffer a cardiac arrest.   Updates: 09/24/2014 - son updated at bedside     The patient is critically ill with multiple organ systems failure and requires high complexity decision making for assessment and support, frequent evaluation and titration of therapies, application of advanced monitoring technologies and extensive interpretation of multiple databases.   Critical Care Time devoted to patient care services described in this note is  35  Minutes. This time reflects time of care of this signee Dr Kalman Shan. This critical care time does not reflect procedure time, or teaching time or supervisory time of PA/NP/Med student/Med Resident etc but could involve care discussion time    Dr. Kalman Shan, M.D., [facility]C.P Pulmonary and Critical Care Medicine Staff Physician  System [place] [facility] Pager: [phone], If no answer or between  15:00h - 7:00h: call [phone]  09/24/2014 10:11 AM

## 2014-09-24 NOTE — Progress Notes (Signed)
Pharmacy - Coumadin dosing  Assessment: Patient's a 74 y.o M presented to the ED on 5/9 with c/o constipation and emesis. He was on coumadin PTA for afib with INR supratherapeutic at 9 on admit and was given multiple vit K doses for reversal.  Now intubated with VDRF.  INR returned to subtherapeutic, but platelets decreased to 68.  MD would like to avoid oral and parenteral anticoagulation at present  Plan:  Continue to hold coumadin  F/u if/when transition to parenteral anticoagulation is warranted  F/u CBC, INR  Nathan [name], PharmD Pager: [phone] 09/24/2014, 11:32 AM

## 2014-09-25 ENCOUNTER — Encounter (HOSPITAL_COMMUNITY): Payer: Medicare PPO

## 2014-09-25 ENCOUNTER — Ambulatory Visit: Payer: Medicare PPO | Admitting: Family

## 2014-09-25 DIAGNOSIS — R1010 Upper abdominal pain, unspecified: Secondary | ICD-10-CM

## 2014-09-25 DIAGNOSIS — I340 Nonrheumatic mitral (valve) insufficiency: Secondary | ICD-10-CM

## 2014-09-25 DIAGNOSIS — R109 Unspecified abdominal pain: Secondary | ICD-10-CM | POA: Insufficient documentation

## 2014-09-25 LAB — BASIC METABOLIC PANEL
ANION GAP: 10 (ref 5–15)
ANION GAP: 11 (ref 5–15)
ANION GAP: 10 (ref 5–15)
BUN: 76 mg/dL — ABNORMAL HIGH (ref 6–20)
BUN: 74 mg/dL — ABNORMAL HIGH (ref 6–20)
BUN: 79 mg/dL — ABNORMAL HIGH (ref 6–20)
CALCIUM: 7.6 mg/dL — AB (ref 8.9–10.3)
CHLORIDE: 111 mmol/L (ref 101–111)
CHLORIDE: 112 mmol/L — AB (ref 101–111)
CO2: 22 mmol/L (ref 22–32)
CO2: 21 mmol/L — AB (ref 22–32)
CO2: 21 mmol/L — ABNORMAL LOW (ref 22–32)
Calcium: 7.6 mg/dL — ABNORMAL LOW (ref 8.9–10.3)
Calcium: 7.9 mg/dL — ABNORMAL LOW (ref 8.9–10.3)
Chloride: 113 mmol/L — ABNORMAL HIGH (ref 101–111)
Creatinine, Ser: 2.9 mg/dL — ABNORMAL HIGH (ref 0.61–1.24)
Creatinine, Ser: 2.93 mg/dL — ABNORMAL HIGH (ref 0.61–1.24)
Creatinine, Ser: 2.92 mg/dL — ABNORMAL HIGH (ref 0.61–1.24)
GFR calc Af Amer: 23 mL/min — ABNORMAL LOW (ref >60)
GFR calc Af Amer: 23 mL/min — ABNORMAL LOW (ref >60)
GFR calc Af Amer: 23 mL/min — ABNORMAL LOW (ref >60)
GFR calc non Af Amer: 20 mL/min — ABNORMAL LOW (ref >60)
GFR calc non Af Amer: 19 mL/min — ABNORMAL LOW (ref >60)
GFR calc non Af Amer: 20 mL/min — ABNORMAL LOW (ref >60)
GLUCOSE: 117 mg/dL — AB (ref 65–99)
Glucose, Bld: 112 mg/dL — ABNORMAL HIGH (ref 65–99)
Glucose, Bld: 115 mg/dL — ABNORMAL HIGH (ref 65–99)
POTASSIUM: 2.9 mmol/L — AB (ref 3.5–5.1)
POTASSIUM: 4 mmol/L (ref 3.5–5.1)
POTASSIUM: 5.4 mmol/L — AB (ref 3.5–5.1)
SODIUM: 143 mmol/L (ref 135–145)
Sodium: 144 mmol/L (ref 135–145)
Sodium: 144 mmol/L (ref 135–145)

## 2014-09-25 LAB — CBC WITH DIFFERENTIAL/PLATELET
BASOS PCT: 1 % (ref 0–1)
Basophils Absolute: 0.1 10*3/uL (ref 0.0–0.1)
Eosinophils Absolute: 0.1 10*3/uL (ref 0.0–0.7)
Eosinophils Relative: 1 % (ref 0–5)
HEMATOCRIT: 31.8 % — AB (ref 39.0–52.0)
HEMOGLOBIN: 9.7 g/dL — AB (ref 13.0–17.0)
LYMPHS ABS: 0.8 10*3/uL (ref 0.7–4.0)
Lymphocytes Relative: 14 % (ref 12–46)
MCH: 24.9 pg — ABNORMAL LOW (ref 26.0–34.0)
MCHC: 30.5 g/dL (ref 30.0–36.0)
MCV: 81.7 fL (ref 78.0–100.0)
Monocytes Absolute: 0.5 10*3/uL (ref 0.1–1.0)
Monocytes Relative: 9 % (ref 3–12)
Neutro Abs: 4.2 10*3/uL (ref 1.7–7.7)
Neutrophils Relative %: 75 % (ref 43–77)
Platelets: 57 10*3/uL — ABNORMAL LOW (ref 150–400)
RBC: 3.89 MIL/uL — ABNORMAL LOW (ref 4.22–5.81)
RDW: 20.7 % — AB (ref 11.5–15.5)
WBC: 5.7 10*3/uL (ref 4.0–10.5)

## 2014-09-25 LAB — GLUCOSE, CAPILLARY
GLUCOSE-CAPILLARY: 102 mg/dL — AB (ref 65–99)
GLUCOSE-CAPILLARY: 104 mg/dL — AB (ref 65–99)
Glucose-Capillary: 102 mg/dL — ABNORMAL HIGH (ref 65–99)
Glucose-Capillary: 108 mg/dL — ABNORMAL HIGH (ref 65–99)
Glucose-Capillary: 102 mg/dL — ABNORMAL HIGH (ref 65–99)
Glucose-Capillary: 126 mg/dL — ABNORMAL HIGH (ref 65–99)

## 2014-09-25 LAB — PROTIME-INR
INR: 1.77 — ABNORMAL HIGH (ref 0.00–1.49)
PROTHROMBIN TIME: 20.8 s — AB (ref 11.6–15.2)

## 2014-09-25 LAB — CULTURE, BLOOD (ROUTINE X 2)
Culture: NO GROWTH
Culture: NO GROWTH

## 2014-09-25 LAB — CULTURE, RESPIRATORY

## 2014-09-25 LAB — MAGNESIUM
MAGNESIUM: 2 mg/dL (ref 1.7–2.4)
Magnesium: 2.1 mg/dL (ref 1.7–2.4)
Magnesium: 2.2 mg/dL (ref 1.7–2.4)

## 2014-09-25 LAB — CULTURE, RESPIRATORY W GRAM STAIN

## 2014-09-25 LAB — PHOSPHORUS: Phosphorus: 3.2 mg/dL (ref 2.5–4.6)

## 2014-09-25 MED ORDER — DEXTROSE 5 % IV SOLN
10.0000 mg/h | INTRAVENOUS | Status: DC
  Administered 2014-09-25: 6 mg/h via INTRAVENOUS
  Administered 2014-09-26: 10 mg/h via INTRAVENOUS
  Filled 2014-09-25 (×3): qty 25

## 2014-09-25 MED ORDER — FENTANYL CITRATE (PF) 100 MCG/2ML IJ SOLN
25.0000 ug | INTRAMUSCULAR | Status: DC | PRN
  Administered 2014-09-25 (×3): 25 ug via INTRAVENOUS
  Filled 2014-09-25 (×3): qty 2

## 2014-09-25 MED ORDER — FENTANYL CITRATE (PF) 100 MCG/2ML IJ SOLN
50.0000 ug | INTRAMUSCULAR | Status: DC | PRN
  Administered 2014-09-25 – 2014-09-26 (×5): 50 ug via INTRAVENOUS
  Filled 2014-09-25 (×4): qty 2

## 2014-09-25 MED ORDER — DIPHENHYDRAMINE HCL 50 MG/ML IJ SOLN
12.5000 mg | Freq: Three times a day (TID) | INTRAMUSCULAR | Status: DC | PRN
  Administered 2014-09-25 – 2014-09-28 (×3): 12.5 mg via INTRAVENOUS
  Filled 2014-09-25 (×3): qty 1

## 2014-09-25 MED ORDER — FENTANYL CITRATE (PF) 100 MCG/2ML IJ SOLN
INTRAMUSCULAR | Status: AC
  Filled 2014-09-25: qty 2

## 2014-09-25 MED ORDER — DEXTROSE 5 % IV SOLN
1.0000 g | INTRAVENOUS | Status: DC
  Administered 2014-09-25: 1 g via INTRAVENOUS
  Filled 2014-09-25 (×2): qty 10

## 2014-09-25 MED ORDER — POTASSIUM CHLORIDE 20 MEQ/15ML (10%) PO SOLN
40.0000 meq | Freq: Once | ORAL | Status: AC
  Administered 2014-09-25: 40 meq
  Filled 2014-09-25: qty 30

## 2014-09-25 MED ORDER — POTASSIUM CHLORIDE 10 MEQ/100ML IV SOLN
10.0000 meq | INTRAVENOUS | Status: AC
  Administered 2014-09-25 (×3): 10 meq via INTRAVENOUS
  Filled 2014-09-25 (×3): qty 100

## 2014-09-25 MED ORDER — POTASSIUM CHLORIDE 20 MEQ/15ML (10%) PO SOLN
40.0000 meq | ORAL | Status: DC
  Administered 2014-09-25 (×3): 40 meq
  Filled 2014-09-25 (×3): qty 30

## 2014-09-25 MED ORDER — DIGOXIN 0.25 MG/ML IJ SOLN
0.2500 mg | Freq: Three times a day (TID) | INTRAMUSCULAR | Status: AC
  Administered 2014-09-25 – 2014-09-26 (×3): 0.25 mg via INTRAVENOUS
  Filled 2014-09-25 (×3): qty 1

## 2014-09-25 NOTE — Care Management Note (Signed)
Case Management Note  Patient Details  Name: Nathan [name] MRN: [id number] Date of Birth: 06/05/1940  Subjective/Objective:                 resp failure with heart failure on full vent support as of [id number].   Action/Plan: Home when stable   Expected Discharge Date:   (UNKNOWN)               Expected Discharge Plan:  Home/Self Care  In-House Referral:  NA  Discharge planning Services  CM Consult  Post Acute Care Choice:  NA Choice offered to:  NA  DME Arranged:    DME Agency:     HH Arranged:    HH Agency:     Status of Service:  In process, will continue to follow  Medicare Important Message Given:    Date Medicare IM Given:    Medicare IM give by:    Date Additional Medicare IM Given:    Additional Medicare Important Message give by:     If discussed at Long Length of Stay Meetings, dates discussed:    Additional Comments:  Golda AcreDavis, Rhonda Lynn, RN 09/25/2014, 8:11 AM

## 2014-09-25 NOTE — Progress Notes (Signed)
eLink Physician-Brief Progress Note Patient Name: Allen KellMaurice W Zumwalt DOB: 12/30/1940 MRN: [id number]   Date of Service  09/25/2014  HPI/Events of Note  Patient agitated on Fentanyl 25 mcg Q 2 hours PRN. Overbreathing ventilator.  eICU Interventions  Will increase Fentanyl IV PRN to 50 mcg Q 1 hour PRN.      Intervention Category Minor Interventions: Agitation / anxiety - evaluation and management  Lenell AntuSommer,Steven Eugene 09/25/2014, 9:36 PM

## 2014-09-25 NOTE — Progress Notes (Signed)
eLink Physician-Brief Progress Note Patient Name: Nathan [name] DOB: 05/13/1940 MRN: [id number]   Date of Service  09/25/2014  HPI/Events of Note    eICU Interventions  Hypokalemia -repleted      Intervention Category Intermediate Interventions: Electrolyte abnormality - evaluation and management  Thiago Ragsdale V. 09/25/2014, 6:08 AM

## 2014-09-25 NOTE — Progress Notes (Signed)
ANTIBIOTIC CONSULT NOTE - FOLLOW UP  Pharmacy Consult for Zosyn--> ceftriaxone Indication: CAP  No Known Allergies  Patient Measurements: Height: 6\' 1"  (185.4 cm) Weight: 216 lb 0.8 oz (98 kg) IBW/kg (Calculated) : 79.9  Vital Signs: Temp: 98.4 F (36.9 C) (05/16 0900) BP: 103/64 mmHg (05/16 0900) Pulse Rate: 110 (05/16 0900) Intake/Output from previous day: 05/15 0701 - 05/16 0700 In: 3915.7 [I.V.:1415.7; NG/GT:2300; IV Piggyback:200] Out: 1505 [Urine:1505] Intake/Output from this shift: Total I/O In: 440 [I.V.:100; NG/GT:140; IV Piggyback:200] Out: 170 [Urine:170]  Labs:  Recent Labs  09/23/14 0405 09/24/14 0500 09/25/14 0510  WBC 7.7 6.4 5.7  HGB 9.2* 9.7* 9.7*  PLT 92* 68* 57*  CREATININE 3.47* 3.37* 2.90*   Estimated Creatinine Clearance: 27.1 mL/min (by C-G formula based on Cr of 2.9).  Recent Labs  09/23/14 0405  VANCOTROUGH 19     Microbiology: Recent Results (from the past 720 hour(s))  Blood Culture (routine x 2)     Status: None   Collection Time: 09/19/14 12:15 AM  Result Value Ref Range Status   Specimen Description BLOOD RIGHT ANTECUBITAL  Final   Special Requests BOTTLES DRAWN AEROBIC AND ANAEROBIC 5ML  Final   Culture   Final    NO GROWTH 5 DAYS Performed at [facility]    Report Status 09/25/2014 FINAL  Final  Blood Culture (routine x 2)     Status: None   Collection Time: 09/19/14 12:16 AM  Result Value Ref Range Status   Specimen Description BLOOD L FOREARM  Final   Special Requests BOTTLES DRAWN AEROBIC AND ANAEROBIC 5ML  Final   Culture   Final    NO GROWTH 5 DAYS Performed at [facility]    Report Status 09/25/2014 FINAL  Final  Urine culture     Status: None   Collection Time: 09/19/14  6:52 AM  Result Value Ref Range Status   Specimen Description URINE, RANDOM  Final   Special Requests NONE  Final   Colony Count NO GROWTH Performed at [facility]   Final   Culture NO GROWTH Performed at  [facility]   Final   Report Status 09/20/2014 FINAL  Final  MRSA PCR Screening     Status: None   Collection Time: 09/19/14  1:30 PM  Result Value Ref Range Status   MRSA by PCR NEGATIVE NEGATIVE Final    Comment:        The GeneXpert MRSA Assay (FDA approved for NASAL specimens only), is one component of a comprehensive MRSA colonization surveillance program. It is not intended to diagnose MRSA infection nor to guide or monitor treatment for MRSA infections.   Clostridium Difficile by PCR     Status: None   Collection Time: 09/20/14  4:35 PM  Result Value Ref Range Status   C difficile by pcr NEGATIVE NEGATIVE Final  Culture, respiratory (NON-Expectorated)     Status: None (Preliminary result)   Collection Time: 09/23/14  1:15 PM  Result Value Ref Range Status   Specimen Description TRACHEAL ASPIRATE  Final   Special Requests NONE  Final   Gram Stain   Final    RARE WBC PRESENT, PREDOMINANTLY PMN RARE SQUAMOUS EPITHELIAL CELLS PRESENT RARE YEAST Performed at [facility]    Culture   Final    Culture reincubated for better growth Performed at [facility]    Report Status PENDING  Incomplete    Anti-infectives    Start  Dose/Rate Route Frequency Ordered Stop   09/23/14 2200  piperacillin-tazobactam (ZOSYN) IVPB 3.375 g  Status:  Discontinued     3.375 g 12.5 mL/hr over 240 Minutes Intravenous 3 times per day 09/23/14 0817 09/25/14 1004   09/23/14 0900  vancomycin (VANCOCIN) 1,250 mg in sodium chloride 0.9 % 250 mL IVPB     1,250 mg 166.7 mL/hr over 90 Minutes Intravenous  Once 09/23/14 0817 09/23/14 1042   09/21/14 0245  vancomycin (VANCOCIN) IVPB 1000 mg/200 mL premix     1,000 mg 200 mL/hr over 60 Minutes Intravenous  Once 09/21/14 0236 09/21/14 0432   09/20/14 1600  vancomycin (VANCOCIN) 50 mg/mL oral solution 500 mg  Status:  Discontinued     500 mg Oral 4 times per day 09/20/14 1357 09/21/14 1305   09/20/14 1600  metroNIDAZOLE  (FLAGYL) IVPB 500 mg  Status:  Discontinued     500 mg 100 mL/hr over 60 Minutes Intravenous Every 8 hours 09/20/14 1357 09/21/14 1305   09/20/14 0900  piperacillin-tazobactam (ZOSYN) IVPB 2.25 g     2.25 g 100 mL/hr over 30 Minutes Intravenous Every 6 hours 09/20/14 0823 09/23/14 1632   09/19/14 2359  vancomycin (VANCOCIN) IVPB 750 mg/150 ml premix  Status:  Discontinued     750 mg 150 mL/hr over 60 Minutes Intravenous Every 24 hours 09/19/14 0044 09/20/14 0821   09/19/14 2200  ceFEPIme (MAXIPIME) 1 g in dextrose 5 % 50 mL IVPB  Status:  Discontinued     1 g 100 mL/hr over 30 Minutes Intravenous Every 24 hours 09/19/14 0042 09/19/14 0240   09/19/14 0800  piperacillin-tazobactam (ZOSYN) IVPB 3.375 g  Status:  Discontinued     3.375 g 12.5 mL/hr over 240 Minutes Intravenous Every 8 hours 09/19/14 0243 09/20/14 0821   09/29/2014 2345  ceFEPIme (MAXIPIME) 2 g in dextrose 5 % 50 mL IVPB     2 g 100 mL/hr over 30 Minutes Intravenous  Once 09/12/2014 2340 09/19/14 0049   09/17/2014 2345  vancomycin (VANCOCIN) IVPB 1000 mg/200 mL premix     1,000 mg 200 mL/hr over 60 Minutes Intravenous  Once 10/04/2014 2340 09/19/14 0209     Assessment 73 yoM admitted 5/9 with hx COPD and PE c/o constipation x 5 days and bilateral leg swelling x 3 weeks.  Zosyn and Vancomycin per Rx for sepsis from HCAP/colitis.  Despite improvements in fevers/WBC, developed progressive respiratory distress and lactic acidosis requiring intubation. To change abx to ceftriaxone for PNA today per CCM.  5/9 >> cefepime >> 5/9 5/9 >> vancomycin  >>   5/14 5/10 >> zosyn >>5/16 5/11 >> flagyl/PO vanc (r/o C.diff) >> 5/12 5/16 >> CTX >>  Temp: Afebrile WBC: improved to wnl Renal: AKI, improving slowly; CrCl now 27 CG but UOP worse than yesterday CXR w/ stable BL opacities & effusions LA: spiked again 5/13, resolved 5/14  5/10 bcx x2: neg FINAL 5/10 ucx: neg FINAL 5/10 MRSA PCR ( -) 5/11 cdiff ( - ) 5/14 TA: rare yeast  5/9 CT:  small BL pleural effusion with consolidation consistent with RLL PNA 5/13 CXR: suggestion of developing infiltrate LML, no mention of RLL consolidation  Dose changes/levels: 5/12 at 0100 VR: 16 (after 1 g on 5/10 and 750 mg on 5/11) >> 1g 5/14 at 0500 VR: 19 (after 1 g on 5/12 at 0330)   Plan:  - ceftriaxone 1gm IV q24h - pharmacy will sign off as no renal adjustment is needed with ceftriaxone.  Dorna LeitzAnh Habeeb Puertas,  PharmD, BCPS 09/25/2014 10:26 AM

## 2014-09-25 NOTE — Consult Note (Signed)
CARDIOLOGY CONSULT NOTE   Patient ID: Nathan [name] MRN: [id number] DOB/AGE: 76/09/1940 76 y.o.  Admit date: 09/11/2014  Primary Physician   Lillia MountainGRIFFIN,JOHN JOSEPH, MD Primary Cardiologist  Dr. Katrinka BlazingSmith  Reason for Consultation   New severe mitral regurgitation and TEE  HPI: Nathan [name] is a 74 y.o. male with a history of chronic combine S/D CHF, CAD s/p CABG x4 (2013), COPD, CKD, HTN, PVD s/p bilat aorto-iliac stenting (2007), carotid artery disease s/p RCEA (2013), CVA, PAF s/p ablation on coumadin, previous LGIB (2014), recent PE after hip fracture and mod MR who [facility] w/ constipation and SOB and found to have sepsis 2/2 HCAP and colitis. Patient also noted to have severe coagulopathy with INR >10, acute renal failure and hypotension. He had been improving until 5/12 early am with rapid deterioration with obtundation, bradycardia, and hypoxia (50% O2 sat). Intubated by CRNA for respiratory insufficiency. PCCM asked to see. 2D ECHO revealed new severe MR and recommended TEE to further investigate. Cardiology consulted for new valvular disease and further testing.   He was recently admitted for hip fracture, which was to be managed conservatively. During that admission he was found to have pulmonary embolism and started on heprain >> coumadin. Also developed Atrial fibrillation. He was moved to ICU as a result of these things and required BiPAP also had ileus. Had improved and was sent home. 5/9 [facility] complaining of constipation x 3 days, fever, chills. He was suspected to have colitis and area of consolidation on CXR. Elevated lactic as well. He was admitted to hospitalist team for sepsis secondary to colitis/PNA. He also developed atrial fibrillation with rapid ventricular response. He was not attempted on rate control medications besides his home beta blockers. INR was elevated at > 10 at time of admission, got vitamin K. Despite IV antibiotics  and fluid boluses he remained tachycardic and hypotensive. Lactic acid had gone down, but had never entirely cleared. 5/12 early AM he developed bradycardia, hypoxia with sats around 50, and became obtunded. He was intubated by CRNA. PCCM called for consultation. Now cardiology has been consulted for further eval of MR and possible TEE.   Patient ventilated currently. Does open eyes to questions and squeeze hands.   STUDIES:  Admission CXR > bilateral effusions R>L. 10/02/2014 5/9 CT abd > Small bilateral pleural effusions greater on the right. Consolidation in the right lower lung suggesting pneumonia. Small amount of abdominal and pelvic free fluid. Increased density suggesting sludge versus milk of calcium in the gallbladder. Mild stranding around the sigmoid colon suggesting early diverticulitis.Left inguinal hernia containing fat and fluid. ECHO Normal LV size with mild LV hypertrophy. EF 45-50% with mild diffusehypokinesis. D-shaped interventricular septum suggests RV pressure/volume overload. Mildly dilated RV with normal systolic function. Moderate TR. Mild pulmonary hypertension. MR appeared severe.   EVENTS Admitted 5/9: colitis, HCAP, sepsis w/ elevated lactate and coagulopathy 5/11: still hypotensive, wbc rising. Aggressive volume resuscitation provided.  5/12 cdiff negative. Felt sepsis from bacterial translocation from gut. ABX continued. More lethargic  5/13: rapid deterioration. he developed bradycardia, hypoxia with sats around 50, and became obtunded. He was intubated by CRNA. In septic shock, placed on pressors  5/14: still on pressors. Severe MR on echo. Creatinine improved.  5/15 platelets dropped 68, weaning pressors. More awake, but failed SBT. Tolerating tubefeeds.  5/16: off pressors. Failed SBT. Started lasix gtt. Narrowed abx    Past Medical History  Diagnosis Date  . Aorto-iliac disease     a. s/p bilat with stent 2007 - followed by [facility].  . CAD (coronary artery  disease)     a. 10/2011 s/p CABG x4 (LIMA-LAD, SVG-Diag, SVG-OM1 and SVG-PDA)  b. 03/2012 NSTEMI s/p LHC SVG-RCA and LIMA-LAD but total occlusion of SVG-OM as well as SVG-diag. RX therapy recommended   . Hypertension   . ED (erectile dysfunction)   . COPD, severe   . Hyperlipidemia   . Stroke   . GERD (gastroesophageal reflux disease)   . Gout   . Paroxysmal atrial flutter     ablation  . Chronic combined systolic and diastolic CHF (congestive heart failure)     a. Last echo 04/2014: EF 40-45%.  Marland Kitchen PAF (paroxysmal atrial fibrillation)   . CKD (chronic kidney disease), stage III   . Tobacco abuse   . History of noncompliance with medical treatment   . Carotid artery disease     a. RCEA 2013 - followed by [facility].  . Anemia   . Lower GI bleed     a. LGIB 10/2012: Colonoscopy performed June 21 showed blood in the colon but no definite source identified, diverticular versus AVM suspected. Aspirin stopped.  . Mitral regurgitation   . Hypertensive heart disease   . PE (pulmonary embolism)      Past Surgical History  Procedure Laterality Date  . Spinal fusion    . Hand surgery    . Neck fusion  1975  . Eye surgery    . Coronary artery bypass graft  10/31/2011    Procedure: CORONARY ARTERY BYPASS GRAFTING (CABG);  Surgeon: Delight Ovens, MD;  Location: [facility] OR;  Service: Open Heart Surgery;  Laterality: N/A;  times three using  Left Internal Mammary Artery and Right Greater Saphenous Vein Graft harvested endoscopically; TEE  . Endarterectomy  10/31/2011    Procedure: ENDARTERECTOMY CAROTID;  Surgeon: Nada Libman, MD;  Location: [facility] OR;  Service: Vascular;  Laterality: Right;  with patch angioplasty   . Carotid endarterectomy  10-31-2011    right  . Colonoscopy N/A 10/30/2012    Procedure: COLONOSCOPY;  Surgeon: Shirley Friar, MD;  Location: [facility] ENDOSCOPY;  Service: Endoscopy;  Laterality: N/A;  . Left heart catheterization with coronary angiogram N/A 09/04/2011    Procedure: LEFT  HEART CATHETERIZATION WITH CORONARY ANGIOGRAM;  Surgeon: Lesleigh Noe, MD;  Location: [facility] CATH LAB;  Service: Cardiovascular;  Laterality: N/A;  . Left heart cath N/A 04/03/2012    Procedure: LEFT HEART CATH;  Surgeon: Lennette Bihari, MD;  Location: [facility] CATH LAB;  Service: Cardiovascular;  Laterality: N/A;  . Pelvic fracture surgery      No Known Allergies  I have reviewed the patient's current medications . antiseptic oral rinse  7 mL Mouth Rinse QID  . atorvastatin  10 mg Oral q1800  . budesonide (PULMICORT) nebulizer solution  0.5 mg Nebulization BID  . cefTRIAXone (ROCEPHIN)  IV  1 g Intravenous Q24H  . chlorhexidine  15 mL Mouth Rinse BID  . ipratropium-albuterol  3 mL Nebulization Q6H  . pantoprazole sodium  40 mg Per Tube Q24H  . potassium chloride  10 mEq Intravenous Q1 Hr x 3  . potassium chloride  40 mEq Per Tube Q4H  . senna  1 tablet Oral Daily  . sodium chloride  3 mL Intravenous Q12H   . sodium chloride 50 mL/hr (09/24/14 1619)  . feeding supplement (VITAL AF 1.2 CAL) 1,000 mL (09/25/14  [facility])  . furosemide (LASIX) infusion     fentaNYL (SUBLIMAZE) injection, levalbuterol  Prior to Admission medications   Medication Sig Start Date End Date Taking? Authorizing Provider  albuterol (PROVENTIL) (2.5 MG/3ML) 0.083% nebulizer solution Take 2.5 mg by nebulization 3 (three) times daily. And Q6H PRN   Yes Historical Provider, MD  atorvastatin (LIPITOR) 10 MG tablet Take 1 tablet (10 mg total) by mouth daily at 6 PM. 01/05/14  Yes Jeralyn Bennett, MD  bisacodyl (DULCOLAX) 5 MG EC tablet Take 5 mg by mouth daily as needed for mild constipation or moderate constipation.   Yes Historical Provider, MD  budesonide-formoterol (SYMBICORT) 160-4.5 MCG/ACT inhaler Inhale 2 puffs into the lungs 2 (two) times daily.   Yes Historical Provider, MD  buPROPion (WELLBUTRIN XL) 300 MG 24 hr tablet Take 300 mg by mouth daily.   Yes Historical Provider, MD  carvedilol (COREG) 3.125 MG tablet Take  1 tablet (3.125 mg total) by mouth 2 (two) times daily with a meal. 07/10/14  Yes Osvaldo Shipper, MD  colchicine 0.6 MG tablet Take 0.6 mg by mouth daily.   Yes Historical Provider, MD  furosemide (LASIX) 40 MG tablet Take 1 tablet (40 mg total) by mouth daily. 06/20/14  Yes Lyn Records, MD  mirtazapine (REMERON) 7.5 MG tablet Take 7.5 mg by mouth at bedtime.   Yes Historical Provider, MD  oxyCODONE (OXY IR/ROXICODONE) 5 MG immediate release tablet Take 1 tablet (5 mg total) by mouth every 6 (six) hours as needed for severe pain. 08/03/14  Yes Sharon Seller, NP  pantoprazole (PROTONIX) 40 MG tablet Take 1 tablet (40 mg total) by mouth daily. 07/10/14  Yes Osvaldo Shipper, MD  polyethylene glycol (MIRALAX / GLYCOLAX) packet Take 17 g by mouth daily. 07/10/14  Yes Osvaldo Shipper, MD  potassium chloride 20 MEQ TBCR Take 20 mEq by mouth 2 (two) times daily. 07/10/14  Yes Osvaldo Shipper, MD  warfarin (COUMADIN) 4 MG tablet Take 4 mg by mouth daily.   Yes Historical Provider, MD  acetaminophen (TYLENOL) 325 MG tablet Take 1 tablet (325 mg total) by mouth every 6 (six) hours. 07/10/14   Tiffany Neva Seat, PA-C  docusate sodium (COLACE) 100 MG capsule Take 1 capsule (100 mg total) by mouth 2 (two) times daily. Patient not taking: Reported on 18-Oct-2014 07/10/14   Osvaldo Shipper, MD  feeding supplement, ENSURE COMPLETE, (ENSURE COMPLETE) LIQD Take 237 mLs by mouth 2 (two) times daily between meals. Patient not taking: Reported on 10/18/2014 07/10/14   Osvaldo Shipper, MD  guaiFENesin (MUCINEX) 600 MG 12 hr tablet Take 600 mg by mouth 2 (two) times daily as needed for cough.     Historical Provider, MD  ipratropium-albuterol (DUONEB) 0.5-2.5 (3) MG/3ML SOLN Take 3 mLs by nebulization every 4 (four) hours as needed. For shortness of breath    Historical Provider, MD  nitroGLYCERIN (NITROSTAT) 0.4 MG SL tablet Place 0.4 mg under the tongue every 5 (five) minutes as needed for chest pain.    Historical Provider, MD    saccharomyces boulardii (FLORASTOR) 250 MG capsule Take 1 capsule (250 mg total) by mouth 2 (two) times daily. Patient not taking: Reported on 10/18/14 07/10/14   Osvaldo Shipper, MD  sennosides (SENOKOT) 8.8 MG/5ML syrup Take 5 mLs by mouth 2 (two) times daily. Patient not taking: Reported on 10-18-2014 07/10/14   Osvaldo Shipper, MD  tiotropium (SPIRIVA) 18 MCG inhalation capsule Place 1 capsule (18 mcg total) into inhaler and inhale daily. Patient not taking: Reported  on 09/30/2014 06/18/14   Rhetta MuraJai-Gurmukh Samtani, MD     History   Social History  . Marital Status: Married    Spouse Name: N/A  . Number of Children: N/A  . Years of Education: N/A   Occupational History  . retired    Social History Main Topics  . Smoking status: Former Smoker -- 1.00 packs/day for 64 years    Types: Cigarettes    Quit date: 06/23/2014  . Smokeless tobacco: Never Used  . 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 Drug Use: No  . Sexual Activity: Not on file   Other Topics Concern  . Not on file   Social History Narrative    Family Status  Relation Status Death Age  . Mother Deceased 1182  . Father Deceased 23101   Family History  Problem Relation Age of Onset  . Cancer Mother   . Tuberculosis Mother   . Heart disease Father     He did NOT have it before age 74     ROS:  Full 14 point review of systems complete and found to be negative unless listed above.  Physical Exam: Blood pressure 106/62, pulse 117, temperature 98.2 F (36.8 C), temperature source Core (Comment), resp. rate 16, height 6\' 1"  (1.854 m), weight 216 lb 0.8 oz (98 kg), SpO2 100 %.  General: Well developed, well nourished, male in no acute distress. Not oriented. Opens his eye to stimulus Head: Eyes PERRLA, No xanthomas.   Normocephalic and atraumatic, oropharynx without edema or exudate.   Lungs: ventilated.  rhonchourus  Heart: HRRR S1 S2, no rub/gallop, Heart irregular rate and rhythm with S1, S2  murmur. pulses are 2+ extrem.   Neck: No carotid bruits. No lymphadenopathy.  +JVD. Abdomen: Bowel sounds present, abdomen soft and non-tender without masses or hernias noted. Msk:  No spine or cva tenderness. No weakness, no joint deformities or effusions. Extremities: No clubbing or cyanosis. 3+ LE edema.  Neuro: Alert and oriented X 3. No focal deficits noted. Psych:  Good affect, responds appropriately Skin: No rashes or lesions noted.  Labs:   Lab Results  Component Value Date   WBC 5.7 09/25/2014   HGB 9.7* 09/25/2014   HCT 31.8* 09/25/2014   MCV 81.7 09/25/2014   PLT 57* 09/25/2014    Recent Labs  09/25/14 0510  INR 1.77*    Recent Labs Lab 09/23/14 0405  09/25/14 0510  NA 142  < > 143  K 3.0*  < > 2.9*  CL 111  < > 111  CO2 22  < > 22  BUN 84*  < > 76*  CREATININE 3.47*  < > 2.90*  CALCIUM 7.4*  < > 7.6*  PROT 4.6*  --   --   BILITOT 3.0*  --   --   ALKPHOS 79  --   --   ALT 375*  --   --   AST 226*  --   --   GLUCOSE 119*  < > 112*  ALBUMIN 1.8*  --   --   < > = values in this interval not displayed. MAGNESIUM  Date Value Ref Range Status  09/25/2014 2.1 1.7 - 2.4 mg/dL Final    Recent Labs  16/02/9604/13/16 1130 09/22/14 1826 09/24/14 0500  CKTOTAL  --   --  93  CKMB  --   --  3.2  TROPONINI 0.08* 0.08*  --     Lab Results  Component Value Date   DDIMER 8.68* 06/25/2014     Echo: Study Date: 09/22/2014 LV EF: 45% -   50% Study Conclusions - Left ventricle: The cavity size was normal. Wall thickness was   increased in a pattern of mild LVH. Indeterminant diastolic   function. Systolic function was mildly reduced. The estimated   ejection fraction was in the range of 45% to 50%. Diffuse   hypokinesis. - Ventricular septum: Mildly D-shaped interventricular septum with   evidence for RV pressure/volume overload. - Aortic valve: There was no stenosis. -  Mitral valve: There was severe regurgitation. - Left atrium: The atrium was mildly dilated. - Right ventricle: The cavity size was mildly dilated. Systolic   function was normal. - Right atrium: The atrium was moderately dilated. - Pulmonary arteries: PA peak pressure: 38 mm Hg (S). - Systemic veins: IVC measured 2.2 cm with < 50% respirophasic   variation, suggesting RA pressure 15 mmHg. Impressions: - The patient appeared to be in atrial fibrillation. Normal LV size   with mild LV hypertrophy. EF 45-50% with mild diffuse   hypokinesis. D-shaped interventricular septum suggests RV   pressure/volume overload. Mildly dilated RV with normal systolic   function. Moderate TR. Mild pulmonary hypertension. MR appeared   severe. Would consider TEE to further investigate.    ECG:  HR 110 Atrial fibrillation with rapid ventricular response Low voltage QRS Possible Inferior-posterior infarct , age undetermined  Radiology:  Dg Chest Port 1 View  09/24/2014   CLINICAL DATA:  Endotracheal tube present.  EXAM: PORTABLE CHEST - 1 VIEW  COMPARISON:  Sep 23, 2014.  FINDINGS: Stable cardiomediastinal silhouette. Status post coronary artery bypass graft. Endotracheal tube is in grossly good position with distal tip 4 cm above the carina. Left internal jugular catheter is unchanged with distal tip overlying expected position of the SVC. Stable bilateral pleural effusions are noted with right greater than left. Stable bilateral lower lobe opacities are also noted. Bony thorax is intact.  IMPRESSION: Stable bilateral lung opacities and pleural effusions. Stable support apparatus.   Electronically Signed   By: Lupita Raider, M.D.   On: 09/24/2014 07:16    ASSESSMENT AND PLAN:    Principal Problem:   Sepsis Active Problems:   HCAP (healthcare-associated pneumonia)   Colitis   Constipation   AKI (acute kidney injury)   CKD (chronic kidney disease)   Acute systolic congestive heart failure   HLD  (hyperlipidemia)   GERD without esophagitis   Shock   Malnutrition of moderate degree   Acute respiratory failure with hypoxia   Shock circulatory   ATN (acute tubular necrosis)  Nathan [name] is a 74 y.o. male with a history of chronic combine S/D CHF, CAD s/p CABG x4 (2013), COPD, CKD, HTN, PVD s/p bilat aorto-iliac stenting (2007), carotid artery disease s/p RCEA (2013), CVA, PAF s/p ablation on coumadin, previous LGIB (2014), recent PE after hip fracture and mod MR who presented to [facility] on 10/01/2014 w/ constipation and SOB and found to have sepsis 2/2 HCAP and colitis. Patient also noted to have severe coagulopathy with INR >10, acute renal failure and hypotension. He had been improving until 5/12 early am with rapid deterioration with obtundation, bradycardia, and hypoxia (50% O2 sat). Intubated by CRNA for respiratory insufficiency. PCCM asked to see. 2D ECHO revealed new severe MR and recommended  TEE to further investigate. Cardiology consulted for new valvular disease and further testing.   Severe MR- 2D ECHO 06/2014 with EF 40% with diffuse HK and mod MR. Limited 2D ECHO this admission with stable LVEF but now severe MR and suggesting TEE to further investigate. Also 2D ECHO suggestive of RV pressure/volume overload, mod TR and mild pulm HTN.  -- ECG with afib with possible anterior ischemia and tele with 4 beats NSVT. Troponin mildly elevated likely due to demand ischemia. Possibly ischemic mediated new severe MR? Question how aggressive we want to be in this very debilitated man. He is not responsive currently and per long discussion with son, his mental status has deteriorated greatly recently. Dr Katrinka Blazing, his primary cardiologist, who knows him well will see him today and make final recommendations.   Acute hypoxemic respiratory failure -ventilator dependent currently   COPD without acute exacerbation  Bilateral effusions R>L effusion   Shock - septic vs cardiogenic-->shock resolved.  Now off pressors.   AF RVR- HR in 120s currently.   Acute on chronic systolic/diastolic CHF - echo 1/61/09: ef 45% - Continue lasix gtt . Physical exam with evidence of volume overload.   Acute on CKD--> creat improved: 3.37--> 2.9   Hypokalemia - K 2.9. Replete per primary team   Transaminitis, ? Shock liver-->improved   Colitis- H/o recent ileus - tolerating tube feeds. LFT better  Coumadin coagulopathy - INR improved but plat dropped but prob due to sepsis - Follow CBC - Hold coumadin. INR 1.77 today  INFECTIOUS- severe sepsis d/t colitis, and presumed bacterial translocation  prob HCAP BCx2 5/9 >>>neg  UC 5/10 neg Cdif 5/11 > neg Sputum 5/13 >>>  Acute metabolic encephalopathy/Dementia- worsening recently per son. Not even close to baseline currently.   Dispo- goals of care >> continue current medical therapy; no CPR, no defibrillation in the event of cardiac arrest.    Signed: Janetta Hora, PA-C 09/25/2014 10:37 AM  Pager [phone]  Co-Sign MD

## 2014-09-25 NOTE — Progress Notes (Signed)
Nutrition Follow-up  DOCUMENTATION CODES:  Non-severe (moderate) malnutrition in context of chronic illness  INTERVENTION:  Continue Vital AF 1.2 @ goal rate of 60 ml/hr.   Tube feeding regimen provides 1728 kcal (99% of needs), 108 grams of protein, and 1166 ml of H2O.   RD will continue to monitor  NUTRITION DIAGNOSIS:  Inadequate oral intake related to inability to eat as evidenced by NPO status.  ongoing  GOAL:  Patient will meet greater than or equal to 90% of their needs  Not met  MONITOR:  Vent status, TF tolerance, Diet advancement, Labs, Weight trends  REASON FOR ASSESSMENT:  Consult Enteral/tube feeding initiation and management  ASSESSMENT: 74 year old male presented to ED 5/9 c/o constipation x 3 days. Admitted for sepsis 2nd to colitis/HCAP. Initially with elevated lactic. AF RVR on coumadin. INR > 10 at presentation. Had been improving until 5/12 early am with rapid deterioration with obtundation, bradycardia, and hypoxia (50% O2 sat). Intubated by CRNA for respiratory insufficiency.  - Per RN, pt tolerating TF at goal rate with 250 mL residuals this am.  - Pt intubated 5/13. - Pt is +14 L since admission. Admission weight used for calculations (162 lb). Current weight 216 lb.   Patient is currently intubated on ventilator support MV: 10.3 L/min Temp (24hrs), Avg:98.3 F (36.8 C), Min:97.9 F (36.6 C), Max:98.6 F (37 C)  Propofol: none  Labs and medications reviewed  K 2.9  BUN 76  Cr 2.9  Ca 7.6  Height:  Ht Readings from Last 1 Encounters:  09/24/14 _0  (1.854 m)    Weight:  Wt Readings from Last 1 Encounters:  09/25/14 216 lb 0.8 oz (98 kg)    Ideal Body Weight:  83.6 kg (Calculated using admission wt)  Wt Readings from Last 10 Encounters:  09/25/14 216 lb 0.8 oz (98 kg)  08/23/14 162 lb (73.483 kg)  08/14/14 156 lb 6.4 oz (70.943 kg)  08/11/14 157 lb 12.8 oz (71.578 kg)  07/31/14 167 lb 1.6 oz (75.796 kg)  07/11/14 165  lb (74.844 kg)  06/23/14 186 lb (84.369 kg)  06/20/14 200 lb (90.719 kg)  06/18/14 187 lb 12.8 oz (85.186 kg)  06/06/14 183 lb (83.008 kg)    BMI:  Body mass index is 28.51 kg/(m^2).  Estimated Nutritional Needs:  Kcal:  1751  Protein:  110-115 g  Fluid:  1.7 L/day  Skin:  Wound (see comment) (Stage II wound on buttocks)  Diet Order:  Diet NPO time specified  EDUCATION NEEDS:  Education needs no appropriate at this time   Intake/Output Summary (Last 24 hours) at 09/25/14 1148 Last data filed at 09/25/14 1000  Gross per 24 hour  Intake 3066.5 ml  Output   1580 ml  Net 1486.5 ml    Last BM:  5/13  Laurette Schimke [place], [place], [place]

## 2014-09-25 NOTE — Progress Notes (Signed)
PULMONARY / CRITICAL CARE MEDICINE   Name: Nathan KellMaurice W Wichmann MRN: [id number] DOB: 10/06/1940    ADMISSION DATE:  09/30/2014 CONSULTATION DATE:  09/22/2014  REFERRING MD :  Rito EhrlichKrishnan  CHIEF COMPLAINT:  Respiratory failure  INITIAL PRESENTATION: 74 year old male w/ PMH:  PAF on coumadin, GOLD C COPD, CAD, HTN, systolic/diastolic CHF, CKD III, and PE. He was recently admitted for hip fracture, which was to be managed conservatively. During that admission he was found to have pulmonary embolism and started on heprain >> coumadin. Admitted 5/9 w/ working dx of colitis, HCAP and sepsis w/ elevated lactate. 5/12 early AM he developed bradycardia, hypoxia with sats around 50, and became obtunded. He was intubated by CRNA. PCCM called for consultation.    STUDIES:  Admission CXR > bilateral effusions R>L. 09/30/2014 5/9 CT abd > Small bilateral pleural effusions greater on the right. Consolidation in the right lower lung suggesting pneumonia. Small amount of abdominal and pelvic free fluid. Increased density suggesting sludge versus milk of calcium in the gallbladder. Mild stranding around the sigmoid colon suggesting early diverticulitis.Left inguinal hernia containing fat and fluid. ECHO  Normal LV size with mild LV hypertrophy. EF 45-50% with mild diffusehypokinesis. D-shaped interventricular septum suggests RV pressure/volume overload. Mildly dilated RV with normal systolic function. Moderate TR. Mild pulmonary hypertension. MR appeared severe.   EVENTS Admitted 5/9: colitis, HCAP, sepsis w/ elevated lactate and coagulopathy 5/11: still hypotensive, wbc rising. Aggressive volume resuscitation provided.  5/12 cdiff negative. Felt sepsis from bacterial translocation from gut. ABX continued. More lethargic  5/13: rapid deterioration. he developed bradycardia, hypoxia with sats around 50, and became obtunded. He was intubated by CRNA. In septic shock, placed on pressors  5/14: still on pressors. Severe MR  on echo. Creatinine improved.  5/15 platelets dropped 68, weaning pressors. More awake, but failed SBT. Tolerating tubefeeds.  5/16: off pressors. Failed SBT. Started lasix gtt. Narrowed abx   VITAL SIGNS: Temp:  [97.9 F (36.6 C)-98.6 F (37 C)] 98.2 F (36.8 C) (05/16 0800) Pulse Rate:  [57-115] 107 (05/16 0800) Resp:  [14-36] 18 (05/16 0800) BP: (87-120)/(47-81) 100/61 mmHg (05/16 0800) SpO2:  [96 %-100 %] 96 % (05/16 0800) FiO2 (%):  [30 %-40 %] 30 % (05/16 0800) Weight:  [98 kg (216 lb 0.8 oz)] 98 kg (216 lb 0.8 oz) (05/16 0500) HEMODYNAMICS:   VENTILATOR SETTINGS: Vent Mode:  [-] PRVC FiO2 (%):  [30 %-40 %] 30 % Set Rate:  [16 bmp] 16 bmp Vt Set:  [640 mL] 640 mL PEEP:  [5 cmH20] 5 cmH20 Plateau Pressure:  [23 cmH20-32 cmH20] 23 cmH20 INTAKE / OUTPUT:  Intake/Output Summary (Last 24 hours) at 09/25/14 0920 Last data filed at 09/25/14 0800  Gross per 24 hour  Intake 3016.5 ml  Output   1505 ml  Net 1511.5 ml    PHYSICAL EXAMINATION: General:  Elderly male on vent, still sedated  Neuro:  Obtunded on vent with fent RASS -3. PERRL, cough/gag intact HEENT:  [place]/AT, no JVD noted, orally intubated  Cardiovascular: Tachy, irregular Lungs:  Coarse bilateral breath sounds; decreased right base Abdomen:  Soft, distended, fluctuant ascites, hyperactive BS Musculoskeletal:  No acute deformity, peripheral pulses intact Skin:  + 4 pitting edema to BLE  LABS:  PULMONARY  Recent Labs Lab 09/20/14 0222 09/22/14 0225 09/22/14 0445 09/23/14 0355  PHART 7.304* 7.251* 7.371 7.498*  PCO2ART 33.0* 29.0* 24.6* 24.7*  PO2ART 99.9 328* 213* 122*  HCO3 16.2* 12.3* 13.9* 19.4*  TCO2 15.3 11.7  13.0 18.0  O2SAT 96.7 99.7 99.7 99.1   CBC  Recent Labs Lab 09/23/14 0405 09/24/14 0500 09/25/14 0510  HGB 9.2* 9.7* 9.7*  HCT 30.0* 32.6* 31.8*  WBC 7.7 6.4 5.7  PLT 92* 68* 57*   COAGULATION  Recent Labs Lab 09/22/14 0206 09/22/14 0530 09/23/14 0405 09/24/14 0500  09/25/14 0510  INR 4.41* 4.36* 2.66* 1.84* 1.77*   CARDIAC    Recent Labs Lab 09/22/14 0206 09/22/14 1130 09/22/14 1826  TROPONINI 0.08* 0.08* 0.08*   No results for input(s): PROBNP in the last 168 hours.   CHEMISTRY  Recent Labs Lab 09/22/14 0206 09/22/14 0215 09/22/14 0530  09/23/14 0405 09/24/14 0500 09/25/14 0510  NA 139  --  138  --  142 142 143  K 6.1*  --  4.7  < > 3.0* 3.1* 2.9*  CL 108  --  104  --  111 108 111  CO2 15*  --  18*  --  22 21* 22  GLUCOSE 47*  --  109*  --  119* 112* 112*  BUN 86*  --  85*  --  84* 77* 76*  CREATININE 3.94*  --  3.97*  --  3.47* 3.37* 2.90*  CALCIUM 8.3*  --  7.8*  --  7.4* 7.4* 7.6*  MG  --  2.3  --   --  1.9 1.9 2.1  PHOS  --  7.1*  --   --  3.9 3.6 3.2  < > = values in this interval not displayed. Estimated Creatinine Clearance: 27.1 mL/min (by C-G formula based on Cr of 2.9).  LIVER  Recent Labs Lab 09/17/2014 2159 09/19/14 0410  09/22/14 0206 09/22/14 0530 09/23/14 0405 09/24/14 0500 09/25/14 0510  AST 143* 127*  --  490* 385* 226*  --   --   ALT 153* 145*  --  607* 496* 375*  --   --   ALKPHOS 143* 122  --  110 97 79  --   --   BILITOT 2.0* 1.8*  --  3.0* 3.0* 3.0*  --   --   PROT 7.4 6.4*  --  6.2* 5.3* 4.6*  --   --   ALBUMIN 2.9* 2.5*  --  2.4* 2.1* 1.8*  --   --   INR 9.00* >10.00*  < > 4.41* 4.36* 2.66* 1.84* 1.77*  < > = values in this interval not displayed.   INFECTIOUS  Recent Labs Lab 09/22/14 0206 09/22/14 0215 09/22/14 0530 09/23/14 0405 09/24/14 0500  LATICACIDVEN  --  5.6* 4.3* 1.7  --   PROCALCITON 0.69  --   --  0.69 0.52     ENDOCRINE CBG (last 3)   Recent Labs  09/24/14 2349 09/25/14 0430 09/25/14 0725  GLUCAP 102* 102* 102*    IMAGING x48h  - one more images have been personally visualized and highlighted in blue Dg Chest Port 1 View  09/24/2014   CLINICAL DATA:  Endotracheal tube present.  EXAM: PORTABLE CHEST - 1 VIEW  COMPARISON:  Sep 23, 2014.  FINDINGS: Stable  cardiomediastinal silhouette. Status post coronary artery bypass graft. Endotracheal tube is in grossly good position with distal tip 4 cm above the carina. Left internal jugular catheter is unchanged with distal tip overlying expected position of the SVC. Stable bilateral pleural effusions are noted with right greater than left. Stable bilateral lower lobe opacities are also noted. Bony thorax is intact.  IMPRESSION: Stable bilateral lung opacities and pleural effusions. Stable support apparatus.  Electronically Signed   By: Lupita RaiderJames  Green Jr, M.D.   On: 09/24/2014 07:16    ASSESSMENT / PLAN:  PULMONARY OETT 5/13 >>> A: Acute hypoxemic respiratory failure -v entilator dependent COPD without acute exacerbation Bilateral effusions R>L effusion  ? HCAP   - 09/25/2014:failed SBT    P:   Full vent support; decrease Ve (over-ventilating him currently) BD's to nebs VAP bundle Start lasix gtt, aim neg volume status  Repeat pcxr in am   CARDIOVASCULAR CVL 5/13 >>> A:  Shock - septic vs cardiogenic-->shock resolved  AF RVR Acute on chronic systolic/diastolic CHF - echo 9/62/955/13/16: ef 45%  P:  Add lasix gtt  Serial chemistries w/ mg Cont tele   RENAL A:   Acute on CKD-->improved  Hypokalemia   P:   Serial chemistry w/ lasix Aggressive K and Mg replacement  Strict I&O   GASTROINTESTINAL A:   Transaminitis, ? Shock liver-->improved  Colitis H/o recent ileus  - tolerating tube feeds. LFT better  P:   Follow LFT Cont tubefeeds  SUP: IV protonix  HEMATOLOGIC A:   Coumadin coagulopathy Anemia, appears chronic   - INR improved but plat dropped but prob due to sepsis P:  Follow CBC Hold coumadin Follow Coags and platelet PAS  INFECTIOUS BCx2 5/9 >>>neg  UC 5/10 neg Cdif 5/11 > neg Sputum 5/13 >>> A:   Severe sepsis d/t colitis, and presumed bacterial translocation  prob HCAP P:   Abx: Zosyn, start date 5/10 >>>5/16 Abx: Vancomycin, start date 5/10 >>>5/16 abx  rocephin 5/16>>>  ENDOCRINE A:   No acute issues P:   Follow glucose on Bmet Add SSI if consistently greater than 180.  NEUROLOGIC A:   Acute metabolic encephalopathy Dementia  P:   RASS goal: -1 PRN fentanyl for sedation DC remeron Monitor   FAMILY  IDT team done by Dr Craige CottaSood / PA  09/22/14 : Long discussion with wife. Expressed to her that he is critically ill and is requiring multiple forms of life support. She voiced an understanding of this. She states that he is tired of being sick and would not want to continue living in a debilitated state. She supports continuing aggressive medical therapies, but would prefer to no put him through and CPR/ACLS should he suffer a cardiac arrest.   09/25/2014 9:20 AM  Looks better. Still failing wean. Start lasix gtt. Limit sedation. Narrow abx. Hope we can start weaning soon  Simonne Martineteter E Babcock ACNP-BC [facility] Pager # [phone] OR # [phone] if no answer   STAFF NOTE: I, Rory Percyaniel Feinstein, MD FACP have personally reviewed patient's available data, including medical history, events of note, physical examination and test results as part of my evaluation. I have discussed with resident/NP and other care providers such as pharmacist, RN and RRT. In addition, I personally evaluated patient and elicited key findings of: on exam, 4 plus edema, in setting Mitral regurg, and effusion on right, just off pressors, start lasix to neg balance, check bmet, mg, phos in pm, replace K this am, goal 2 liters neg as able, ABG reviewed, reduce MV on vent, SBT attempts, PS increase as needed, has clinically responded and is off pressors, has low plat and inr elevation, will hold off on thoracentesis given these factors and risk/benefit ratio, may in fact need thora if declines, follow pcxr in am, remains culture neg, de escalate to ceftriaxone, TEE on order per Dr Marchelle Gearingamaswamy for assessment of MR, cards consult for new valvular dz The  patient is  critically ill with multiple organ systems failure and requires high complexity decision making for assessment and support, frequent evaluation and titration of therapies, application of advanced monitoring technologies and extensive interpretation of multiple databases.   Critical Care Time devoted to patient care services described in this note is30 Minutes. This time reflects time of care of this signee: Rory Percy, MD FACP. This critical care time does not reflect procedure time, or teaching time or supervisory time of PA/NP/Med student/Med Resident etc but could involve care discussion time. Rest per NP/medical resident whose note is outlined above and that I agree with   Mcarthur Rossetti. Tyson Alias, MD, FACP Pgr: [phone] [place] [facility] 09/25/2014 10:17 AM

## 2014-09-25 NOTE — Progress Notes (Signed)
eLink Physician-Brief Progress Note Patient Name: Nathan [name] DOB: 05/29/1940 MRN: [id number]   Date of Service  09/25/2014  HPI/Events of Note  K+ = 5.4 and Creatinine = 2.92. Patient scheduled to receive KCl 40 meq PO/tube Q 4 hours.   eICU Interventions  Will D/C KCl replacement.      Intervention Category Major Interventions: Electrolyte abnormality - evaluation and management  Lenell AntuSommer,Shykeem Resurreccion [place] 09/25/2014, 9:49 PM

## 2014-09-25 NOTE — Progress Notes (Signed)
Date:  Sep 25, 2014 U.R. performed for needs and level of care. Will continue to follow for Case Management needs.  Rhonda Davis, RN, BSN, CCM   [phone] 

## 2014-09-26 ENCOUNTER — Inpatient Hospital Stay (HOSPITAL_COMMUNITY): Payer: Medicare PPO

## 2014-09-26 DIAGNOSIS — I480 Paroxysmal atrial fibrillation: Secondary | ICD-10-CM

## 2014-09-26 DIAGNOSIS — I5023 Acute on chronic systolic (congestive) heart failure: Secondary | ICD-10-CM

## 2014-09-26 LAB — HEPATIC FUNCTION PANEL
ALT: 149 U/L — ABNORMAL HIGH (ref 17–63)
AST: 51 U/L — ABNORMAL HIGH (ref 15–41)
Albumin: 1.9 g/dL — ABNORMAL LOW (ref 3.5–5.0)
Alkaline Phosphatase: 103 U/L (ref 38–126)
Bilirubin, Direct: 1 mg/dL — ABNORMAL HIGH (ref 0.1–0.5)
Indirect Bilirubin: 1.1 mg/dL — ABNORMAL HIGH (ref 0.3–0.9)
Total Bilirubin: 2.1 mg/dL — ABNORMAL HIGH (ref 0.3–1.2)
Total Protein: 5.6 g/dL — ABNORMAL LOW (ref 6.5–8.1)

## 2014-09-26 LAB — BLOOD GAS, ARTERIAL
Acid-base deficit: 2.1 mmol/L — ABNORMAL HIGH (ref 0.0–2.0)
Bicarbonate: 20.3 mEq/L (ref 20.0–24.0)
Drawn by: 331471
FIO2: 0.3 %
O2 Saturation: 96.8 %
PATIENT TEMPERATURE: 37
PEEP: 5 cmH2O
RATE: 12 resp/min
TCO2: 18.7 mmol/L (ref 0–100)
VT: 640 mL
pCO2 arterial: 27.4 mmHg — ABNORMAL LOW (ref 35.0–45.0)
pH, Arterial: 7.482 — ABNORMAL HIGH (ref 7.350–7.450)
pO2, Arterial: 81.6 mmHg (ref 80.0–100.0)

## 2014-09-26 LAB — CBC WITH DIFFERENTIAL/PLATELET
Basophils Absolute: 0.1 10*3/uL (ref 0.0–0.1)
Basophils Relative: 1 % (ref 0–1)
Eosinophils Absolute: 0.1 10*3/uL (ref 0.0–0.7)
Eosinophils Relative: 1 % (ref 0–5)
HCT: 33.2 % — ABNORMAL LOW (ref 39.0–52.0)
Hemoglobin: 9.8 g/dL — ABNORMAL LOW (ref 13.0–17.0)
Lymphocytes Relative: 16 % (ref 12–46)
Lymphs Abs: 1 10*3/uL (ref 0.7–4.0)
MCH: 24.4 pg — ABNORMAL LOW (ref 26.0–34.0)
MCHC: 29.5 g/dL — ABNORMAL LOW (ref 30.0–36.0)
MCV: 82.8 fL (ref 78.0–100.0)
Monocytes Absolute: 0.7 10*3/uL (ref 0.1–1.0)
Monocytes Relative: 12 % (ref 3–12)
Neutro Abs: 4.3 10*3/uL (ref 1.7–7.7)
Neutrophils Relative %: 70 % (ref 43–77)
Platelets: 57 10*3/uL — ABNORMAL LOW (ref 150–400)
RBC: 4.01 MIL/uL — ABNORMAL LOW (ref 4.22–5.81)
RDW: 20.9 % — ABNORMAL HIGH (ref 11.5–15.5)
WBC: 6.2 10*3/uL (ref 4.0–10.5)

## 2014-09-26 LAB — MAGNESIUM
Magnesium: 2.2 mg/dL (ref 1.7–2.4)
Magnesium: 2.2 mg/dL (ref 1.7–2.4)
Magnesium: 2.1 mg/dL (ref 1.7–2.4)
Magnesium: 2 mg/dL (ref 1.7–2.4)

## 2014-09-26 LAB — DIGOXIN LEVEL: Digoxin Level: 0.9 ng/mL (ref 0.8–2.0)

## 2014-09-26 LAB — GLUCOSE, CAPILLARY
GLUCOSE-CAPILLARY: 47 mg/dL — AB (ref 65–99)
GLUCOSE-CAPILLARY: 87 mg/dL (ref 65–99)
GLUCOSE-CAPILLARY: 93 mg/dL (ref 65–99)
Glucose-Capillary: 104 mg/dL — ABNORMAL HIGH (ref 65–99)
Glucose-Capillary: 96 mg/dL (ref 65–99)
Glucose-Capillary: 100 mg/dL — ABNORMAL HIGH (ref 65–99)
Glucose-Capillary: 104 mg/dL — ABNORMAL HIGH (ref 65–99)
Glucose-Capillary: 133 mg/dL — ABNORMAL HIGH (ref 65–99)
Glucose-Capillary: 49 mg/dL — ABNORMAL LOW (ref 65–99)

## 2014-09-26 LAB — BASIC METABOLIC PANEL
ANION GAP: 11 (ref 5–15)
Anion gap: 11 (ref 5–15)
Anion gap: 10 (ref 5–15)
BUN: 79 mg/dL — ABNORMAL HIGH (ref 6–20)
BUN: 76 mg/dL — ABNORMAL HIGH (ref 6–20)
BUN: 77 mg/dL — ABNORMAL HIGH (ref 6–20)
CHLORIDE: 115 mmol/L — AB (ref 101–111)
CO2: 22 mmol/L (ref 22–32)
CO2: 22 mmol/L (ref 22–32)
CO2: 23 mmol/L (ref 22–32)
Calcium: 8 mg/dL — ABNORMAL LOW (ref 8.9–10.3)
Calcium: 7.9 mg/dL — ABNORMAL LOW (ref 8.9–10.3)
Calcium: 7.9 mg/dL — ABNORMAL LOW (ref 8.9–10.3)
Chloride: 112 mmol/L — ABNORMAL HIGH (ref 101–111)
Chloride: 114 mmol/L — ABNORMAL HIGH (ref 101–111)
Creatinine, Ser: 2.8 mg/dL — ABNORMAL HIGH (ref 0.61–1.24)
Creatinine, Ser: 2.6 mg/dL — ABNORMAL HIGH (ref 0.61–1.24)
Creatinine, Ser: 2.63 mg/dL — ABNORMAL HIGH (ref 0.61–1.24)
GFR calc Af Amer: 24 mL/min — ABNORMAL LOW (ref >60)
GFR calc Af Amer: 26 mL/min — ABNORMAL LOW (ref >60)
GFR calc non Af Amer: 21 mL/min — ABNORMAL LOW (ref >60)
GFR calc non Af Amer: 23 mL/min — ABNORMAL LOW (ref >60)
GFR, EST AFRICAN AMERICAN: 26 mL/min — AB (ref >60)
GFR, EST NON AFRICAN AMERICAN: 22 mL/min — AB (ref >60)
GLUCOSE: 108 mg/dL — AB (ref 65–99)
GLUCOSE: 111 mg/dL — AB (ref 65–99)
Glucose, Bld: 118 mg/dL — ABNORMAL HIGH (ref 65–99)
POTASSIUM: 4 mmol/L (ref 3.5–5.1)
Potassium: 4.5 mmol/L (ref 3.5–5.1)
Potassium: 3.8 mmol/L (ref 3.5–5.1)
SODIUM: 148 mmol/L — AB (ref 135–145)
Sodium: 145 mmol/L (ref 135–145)
Sodium: 147 mmol/L — ABNORMAL HIGH (ref 135–145)

## 2014-09-26 LAB — PROTIME-INR
INR: 1.65 — ABNORMAL HIGH (ref 0.00–1.49)
PROTHROMBIN TIME: 19.7 s — AB (ref 11.6–15.2)

## 2014-09-26 LAB — PHOSPHORUS: Phosphorus: 3.3 mg/dL (ref 2.5–4.6)

## 2014-09-26 MED ORDER — CARVEDILOL 3.125 MG PO TABS
1.5625 mg | ORAL_TABLET | Freq: Two times a day (BID) | ORAL | Status: DC
Start: 2014-09-26 — End: 2014-09-28
  Administered 2014-09-27: 1.5625 mg
  Filled 2014-09-26 (×2): qty 1

## 2014-09-26 MED ORDER — DIGOXIN 0.25 MG/ML IJ SOLN
0.0625 mg | Freq: Every day | INTRAMUSCULAR | Status: DC
  Administered 2014-09-27 – 2014-09-29 (×3): 0.0625 mg via INTRAVENOUS
  Filled 2014-09-26 (×4): qty 0.5

## 2014-09-26 MED ORDER — MIDAZOLAM HCL 2 MG/2ML IJ SOLN
INTRAMUSCULAR | Status: AC
  Filled 2014-09-26: qty 2

## 2014-09-26 MED ORDER — DEXTROSE 50 % IV SOLN
INTRAVENOUS | Status: AC
  Administered 2014-09-26: 25 mL
  Filled 2014-09-26: qty 50

## 2014-09-26 MED ORDER — LEVOFLOXACIN IN D5W 750 MG/150ML IV SOLN
750.0000 mg | INTRAVENOUS | Status: DC
  Administered 2014-09-26 – 2014-09-28 (×2): 750 mg via INTRAVENOUS
  Filled 2014-09-26 (×2): qty 150

## 2014-09-26 MED ORDER — DEXMEDETOMIDINE HCL IN NACL 200 MCG/50ML IV SOLN
0.0000 ug/kg/h | INTRAVENOUS | Status: DC
  Administered 2014-09-26: 0.5 ug/kg/h via INTRAVENOUS
  Administered 2014-09-26: 0.4 ug/kg/h via INTRAVENOUS
  Filled 2014-09-26 (×2): qty 50

## 2014-09-26 MED ORDER — FREE WATER
200.0000 mL | Freq: Three times a day (TID) | Status: DC
  Administered 2014-09-26 – 2014-09-27 (×3): 200 mL

## 2014-09-26 MED ORDER — MIDAZOLAM HCL 2 MG/2ML IJ SOLN
1.0000 mg | INTRAMUSCULAR | Status: DC | PRN
  Administered 2014-09-26 (×2): 2 mg via INTRAVENOUS
  Filled 2014-09-26: qty 2

## 2014-09-26 MED ORDER — HYDROMORPHONE HCL 1 MG/ML IJ SOLN
0.5000 mg | INTRAMUSCULAR | Status: DC | PRN
  Administered 2014-09-26 – 2014-09-27 (×2): 0.5 mg via INTRAVENOUS
  Filled 2014-09-26 (×2): qty 1

## 2014-09-26 NOTE — Progress Notes (Signed)
Hypoglycemic Event  CBG: 47  Treatment: D50 IV 25 mL  Symptoms: Pale  Follow-up CBG: Time: 2125     CBG Result:133  Possible Reasons for Event: Unknown  Comments/MD notified: Melynda RippleELINK   Jarl Sellitto J Hadeel Hillebrand  Remember to initiate Hypoglycemia Order Set & complete

## 2014-09-26 NOTE — Progress Notes (Signed)
PCCM Interval Progress Note  Pt agitated despite RN administering PRN Fentanyl every hour.  Was previously on Fentanyl gtt; however, clinical team was attempting to wean him off.  Will order Midazolam 1 - 2 mg q2hrs PRN for added sedation.   Nathan [name], [place]PA - C [place] [facility] Medicine Pager: [phone]  or [phone] 09/26/2014, 2:24 AM

## 2014-09-26 NOTE — Progress Notes (Signed)
PULMONARY / CRITICAL CARE MEDICINE   Name: Nathan [name] MRN: [id number] DOB: [birth year]    ADMISSION DATE:  09/17/2014 CONSULTATION DATE:  09/22/2014  REFERRING MD :  Rito Ehrlich  CHIEF COMPLAINT:  Respiratory failure  INITIAL PRESENTATION: 74 year old male w/ PMH:  PAF on coumadin, GOLD C COPD, CAD, HTN, systolic/diastolic CHF, CKD III, and PE. He was recently admitted for hip fracture, which was to be managed conservatively. During that admission he was found to have pulmonary embolism and started on heprain >> coumadin. Admitted 5/9 w/ working dx of colitis, HCAP and sepsis w/ elevated lactate. 5/12 early AM he developed bradycardia, hypoxia with sats around 50, and became obtunded. He was intubated by CRNA. PCCM called for consultation.    STUDIES:  Admission CXR > bilateral effusions R>L. 10/09/2014 5/9 CT abd > Small bilateral pleural effusions greater on the right. Consolidation in the right lower lung suggesting pneumonia. Small amount of abdominal and pelvic free fluid. Increased density suggesting sludge versus milk of calcium in the gallbladder. Mild stranding around the sigmoid colon suggesting early diverticulitis.Left inguinal hernia containing fat and fluid. ECHO  Normal LV size with mild LV hypertrophy. EF 45-50% with mild diffusehypokinesis. D-shaped interventricular septum suggests RV pressure/volume overload. Mildly dilated RV with normal systolic function. Moderate TR. Mild pulmonary hypertension. MR appeared severe.   EVENTS Admitted 5/9: colitis, HCAP, sepsis w/ elevated lactate and coagulopathy 5/11: still hypotensive, wbc rising. Aggressive volume resuscitation provided.  5/12 cdiff negative. Felt sepsis from bacterial translocation from gut. ABX continued. More lethargic  5/13: rapid deterioration. he developed bradycardia, hypoxia with sats around 50, and became obtunded. He was intubated by CRNA. In septic shock, placed on pressors  5/14: still on pressors. Severe MR  on echo. Creatinine improved.  5/15 platelets dropped 68, weaning pressors. More awake, but failed SBT. Tolerating tubefeeds.  5/16: off pressors. Failed SBT. Started lasix gtt. Narrowed abx. Seen by cards. They felt MR was due to MR is likely related to papillary muscle dysfunction in this patient with h/o prior inferior MI. Did not think surgical candidate and did not think TEE would add to rx. They added digoxin.  5/17 added precedex, more awake. Changed CTX to levaquin given rash and itching. Changed fentanyl to low dose dilaudid. Increased lasix as did not achieve negative fluid balance.   VITAL SIGNS: Temp:  [97.9 F (36.6 C)-99 F (37.2 C)] 98.6 F (37 C) (05/17 1000) Pulse Rate:  [52-122] 92 (05/17 1000) Resp:  [12-31] 14 (05/17 1000) BP: (79-145)/(29-98) 119/51 mmHg (05/17 1000) SpO2:  [85 %-100 %] 100 % (05/17 1000) FiO2 (%):  [30 %] 30 % (05/17 1000) Weight:  [98.6 kg (217 lb 6 oz)] 98.6 kg (217 lb 6 oz) (05/17 0500) HEMODYNAMICS:   VENTILATOR SETTINGS: Vent Mode:  [-] PRVC FiO2 (%):  [30 %] 30 % Set Rate:  [12 bmp] 12 bmp Vt Set:  [640 mL] 640 mL PEEP:  [5 cmH20] 5 cmH20 Pressure Support:  [16 cmH20] 16 cmH20 Plateau Pressure:  [21 cmH20-29 cmH20] 23 cmH20 INTAKE / OUTPUT:  Intake/Output Summary (Last 24 hours) at 09/26/14 1045 Last data filed at 09/26/14 1000  Gross per 24 hour  Intake 2351.3 ml  Output   1860 ml  Net  491.3 ml    PHYSICAL EXAMINATION: General:  Elderly male on vent, more awake Neuro:  RASS 0, itching constantly, won't f/c . PERRL, cough/gag intact HEENT:  [place]/AT, no JVD noted, orally intubated  Cardiovascular: Tachy, irregular Lungs:  Coarse bilateral rhonchi, decreased right base  Abdomen:  Soft, distended, fluctuant ascites, hyperactive BS Musculoskeletal:  No acute deformity, peripheral pulses intact Skin:  + 4 pitting edema to BLE, evolving rash  LABS:  PULMONARY  Recent Labs Lab 09/20/14 0222 09/22/14 0225 09/22/14 0445  09/23/14 0355 09/26/14 1023  PHART 7.304* 7.251* 7.371 7.498* 7.482*  PCO2ART 33.0* 29.0* 24.6* 24.7* 27.4*  PO2ART 99.9 328* 213* 122* 81.6  HCO3 16.2* 12.3* 13.9* 19.4* 20.3  TCO2 15.3 11.7 13.0 18.0 18.7  O2SAT 96.7 99.7 99.7 99.1 96.8   CBC  Recent Labs Lab 09/24/14 0500 09/25/14 0510 09/26/14 0440  HGB 9.7* 9.7* 9.8*  HCT 32.6* 31.8* 33.2*  WBC 6.4 5.7 6.2  PLT 68* 57* 57*   COAGULATION  Recent Labs Lab 09/22/14 0530 09/23/14 0405 09/24/14 0500 09/25/14 0510 09/26/14 0440  INR 4.36* 2.66* 1.84* 1.77* 1.65*   CARDIAC    Recent Labs Lab 09/22/14 0206 09/22/14 1130 09/22/14 1826  TROPONINI 0.08* 0.08* 0.08*   No results for input(s): PROBNP in the last 168 hours.   CHEMISTRY  Recent Labs Lab 09/22/14 0215  09/23/14 0405 09/24/14 0500 09/25/14 0510 09/25/14 1110 09/25/14 1800 09/26/14 0440  NA  --   < > 142 142 143 144 144 145  K  --   < > 3.0* 3.1* 2.9* 4.0 5.4* 4.5  CL  --   < > 111 108 111 112* 113* 112*  CO2  --   < > 22 21* 22 21* 21* 22  GLUCOSE  --   < > 119* 112* 112* 117* 115* 118*  BUN  --   < > 84* 77* 76* 74* 79* 79*  CREATININE  --   < > 3.47* 3.37* 2.90* 2.93* 2.92* 2.80*  CALCIUM  --   < > 7.4* 7.4* 7.6* 7.6* 7.9* 8.0*  MG 2.3  --  1.9 1.9 2.1 2.0 2.2 2.2  2.2  PHOS 7.1*  --  3.9 3.6 3.2  --   --  3.3  < > = values in this interval not displayed. Estimated Creatinine Clearance: 28.2 mL/min (by C-G formula based on Cr of 2.8).  LIVER  Recent Labs Lab 09/22/14 0206 09/22/14 0530 09/23/14 0405 09/24/14 0500 09/25/14 0510 09/26/14 0440  AST 490* 385* 226*  --   --  51*  ALT 607* 496* 375*  --   --  149*  ALKPHOS 110 97 79  --   --  103  BILITOT 3.0* 3.0* 3.0*  --   --  2.1*  PROT 6.2* 5.3* 4.6*  --   --  5.6*  ALBUMIN 2.4* 2.1* 1.8*  --   --  1.9*  INR 4.41* 4.36* 2.66* 1.84* 1.77* 1.65*     INFECTIOUS  Recent Labs Lab 09/22/14 0206 09/22/14 0215 09/22/14 0530 09/23/14 0405 09/24/14 0500  LATICACIDVEN  --   5.6* 4.3* 1.7  --   PROCALCITON 0.69  --   --  0.69 0.52     ENDOCRINE CBG (last 3)   Recent Labs  09/25/14 2349 09/26/14 0438 09/26/14 0721  GLUCAP 93 104* 96    IMAGING x48h  Dg Chest Port 1 View  09/26/2014   CLINICAL DATA:  Hospital admission for heart failure. Hypertension. COPD.  EXAM: PORTABLE CHEST - 1 VIEW  COMPARISON:  09/24/2014 and 09/22/2014  FINDINGS: Nasogastric tube courses into the stomach and off the inferior portion of the film as tip is not visualized. Endotracheal tube has tip approximately 3.5 cm  above the carina. Left IJ central venous catheter is unchanged with tip over the SVC.  Lungs are adequately inflated with persistent moderate size right pleural effusion and small left pleural effusion. Slight worsening airspace opacification over the right mid to lower lung and stable opacification over the left mid to lower lung. Findings likely represent combination of atelectasis as well as vascular congestion, although cannot exclude infection. Cardiomediastinal silhouette and remainder the exam is unchanged.  IMPRESSION: Slight worsening opacification over the right mid to lower lung with stable opacification of the left mid to lower lung. Findings likely represent combination of atelectasis and vascular congestion although cannot exclude infection. Persistent moderate size right effusion and small left effusion.  Tubes and lines as described.   Electronically Signed   By: Elberta Fortisaniel  Boyle M.D.   On: 09/26/2014 09:57   I had personally reviewed the films. Agree, suspect that there is a significant element of right effusion. Some degree of edema.   ASSESSMENT / PLAN:  PULMONARY OETT 5/13 >>> A: Acute hypoxemic respiratory failure -v entilator dependent COPD without acute exacerbation Bilateral effusions R>L effusion  ? HCAP   - 09/26/2014:failed SBT    P:   Full vent support cont nebs VAP bundle Adjust lasix gtt, aim neg volume status  Repeat pcxr in am    CARDIOVASCULAR CVL 5/13 >>> A:  Shock - septic vs cardiogenic-->shock resolved  AF RVR Acute on chronic systolic/diastolic CHF - echo 1/61/095/13/16: ef 45% Severe MR: MR is likely related to papillary muscle dysfunction in this patient with h/o prior inferior MI.  P:  Adjust lasix gtt  Serial chemistries w/ mg Cont tele  Pharmacy following INR will need to eventually go back on coumadin   Dig added Cards does not think TEE good choice given not surgical candidate.   RENAL A:   Acute on CKD-->improved/ stable.  >did not achieve negative balance  P:   Serial chemistry w/ lasix Strict I&O qshift CVP  GASTROINTESTINAL A:   Transaminitis, ? Shock liver-->improved  Colitis H/o recent ileus  - tolerating tube feeds. LFT better  P:   Follow LFT intermittently  Cont tubefeeds  SUP: IV protonix  HEMATOLOGIC A:   Coumadin coagulopathy Anemia, appears chronic   - INR improved but plat dropped but prob due to sepsis P:  Follow CBC Hold coumadin given low platelets  Follow Coags and platelet PAS  INFECTIOUS BCx2 5/9 >>>neg  UC 5/10 neg Cdif 5/11 > neg Sputum 5/13 >>> A:   Severe sepsis d/t colitis, and presumed bacterial translocation  prob HCAP P:   Abx: Zosyn, start date 5/10 >>>5/16 Abx: Vancomycin, start date 5/10 >>>5/16 abx rocephin 5/16>>>5/17 (stopped d/t itching) levaquin 5/17>>>  ENDOCRINE A:   No acute issues P:   Follow glucose on Bmet Add SSI if consistently greater than 180.  NEUROLOGIC A:   Acute metabolic encephalopathy Dementia  P:   RASS goal: -1 Add precedex given increased agitation requirements and change fentanyl to low dose dilaudid (d/t itching and rash) Monitor  Derm A: rash/urticaria  P: Changed abx to levaquin Changed fentanyl to low dose dilaudid  PRN benadryl   FAMILY  IDT team done by Dr Craige CottaSood / PA  09/22/14 : Long discussion with wife. Expressed to her that he is critically ill and is requiring multiple forms of  life support. She voiced an understanding of this. She states that he is tired of being sick and would not want to continue living in a debilitated  state. She supports continuing aggressive medical therapies, but would prefer to no put him through and CPR/ACLS should he suffer a cardiac arrest.   09/26/2014 10:45 AM  Looks better. Weaning some but not ready to come off vent. Rash and itching seem to be major concern. Will change CTX to levaquin, change sedation to precedex and dilaudid. Have adjusted lasix up. Hope w/ negative volume status we can get him extubated once delirium has improved.   Simonne Martinet ACNP-BC [facility]/Critical Care Pager # [phone] OR # [phone] if no answer 09/26/2014 10:45 AM  Attending Note:  I have examined patient, reviewed labs, studies and notes. I have discussed the case with Kreg Shropshire, and I agree with the data and plans as amended above. Respiratory failure and ARF in setting HCAP. On my eval he is more awake today, is tolerating some PSV with high pressures. Will continue PSV trials, wean PS as able. Hopefully will be a candidate for extubation in coming days Independent critical care time is 35 minutes.   Levy Pupa, MD, PhD 09/26/2014, 4:29 PM [place] [facility] [phone] or if no answer [phone]

## 2014-09-26 NOTE — Progress Notes (Signed)
Patient Name: Nathan [name] Date of Encounter: 09/26/2014     Principal Problem:   Sepsis Active Problems:   HCAP (healthcare-associated pneumonia)   Colitis   Constipation   AKI (acute kidney injury)   CKD (chronic kidney disease)   Acute systolic congestive heart failure   HLD (hyperlipidemia)   GERD without esophagitis   Shock   Malnutrition of moderate degree   Acute respiratory failure with hypoxia   Shock circulatory   ATN (acute tubular necrosis)   Mitral regurgitation   Abdominal pain    SUBJECTIVE Appears more alert today. Still with massive volume overload. Afib rates better controlled. Son present and understands that his father is very sick. Understands and agrees with decision to not do TEE.  CURRENT MEDS . antiseptic oral rinse  7 mL Mouth Rinse QID  . atorvastatin  10 mg Oral q1800  . budesonide (PULMICORT) nebulizer solution  0.5 mg Nebulization BID  . cefTRIAXone (ROCEPHIN)  IV  1 g Intravenous Q24H  . chlorhexidine  15 mL Mouth Rinse BID  . ipratropium-albuterol  3 mL Nebulization Q6H  . pantoprazole sodium  40 mg Per Tube Q24H  . senna  1 tablet Oral Daily  . sodium chloride  3 mL Intravenous Q12H    OBJECTIVE  Filed Vitals:   09/26/14 0300 09/26/14 0338 09/26/14 0400 09/26/14 0500  BP: 79/29 90/47 96/42  145/66  Pulse: 98 97 86 97  Temp: 99 F (37.2 C)  98.2 F (36.8 C) 98.1 F (36.7 C)  TempSrc:      Resp: 13 17 12 26   Height:      Weight:    217 lb 6 oz (98.6 kg)  SpO2: 100% 97% 100% 100%    Intake/Output Summary (Last 24 hours) at 09/26/14 0747 Last data filed at 09/26/14 0600  Gross per 24 hour  Intake 2547.3 ml  Output   1685 ml  Net  862.3 ml   Filed Weights   09/24/14 0838 09/25/14 0500 09/26/14 0500  Weight: 211 lb 3.2 oz (95.8 kg) 216 lb 0.8 oz (98 kg) 217 lb 6 oz (98.6 kg)    PHYSICAL EXAM  General: Well developed, well nourished, male in no acute distress. Not oriented. Opens his eye to stimulus Head: Eyes  PERRLA, No xanthomas. Normocephalic and atraumatic, oropharynx without edema or exudate.  Lungs: ventilated. rhonchourus  Heart: HRRR S1 S2, no rub/gallop, Heart irregular rate and rhythm with S1, S2 murmur. pulses are 2+ extrem.  Neck: No carotid bruits. No lymphadenopathy. +JVD. Abdomen: Bowel sounds present, abdomen soft and non-tender without masses or hernias noted. Msk: No spine or cva tenderness. No weakness, no joint deformities or effusions. Extremities: No clubbing or cyanosis. 3+ LE edema.  Neuro: Alert and oriented X 3. No focal deficits noted. Psych: Good affect, responds appropriately Skin: No rashes or lesions noted.  Accessory Clinical Findings  CBC  Recent Labs  09/25/14 0510 09/26/14 0440  WBC 5.7 6.2  NEUTROABS 4.2 4.3  HGB 9.7* 9.8*  HCT 31.8* 33.2*  MCV 81.7 82.8  PLT 57* 57*   Basic Metabolic Panel  Recent Labs  09/25/14 0510  09/25/14 1800 09/26/14 0440  NA 143  < > 144 145  K 2.9*  < > 5.4* 4.5  CL 111  < > 113* 112*  CO2 22  < > 21* 22  GLUCOSE 112*  < > 115* 118*  BUN 76*  < > 79* 79*  CREATININE 2.90*  < > 2.92* 2.80*  CALCIUM 7.6*  < > 7.9* 8.0*  MG 2.1  < > 2.2 2.2  2.2  PHOS 3.2  --   --  3.3  < > = values in this interval not displayed. Liver Function Tests  Recent Labs  09/26/14 0440  AST 51*  ALT 149*  ALKPHOS 103  BILITOT 2.1*  PROT 5.6*  ALBUMIN 1.9*   No results for input(s): LIPASE, AMYLASE in the last 72 hours. Cardiac Enzymes  Recent Labs  09/24/14 0500  CKTOTAL 93  CKMB 3.2    TELE  afib with CVR. Freq PVCs  Radiology/Studies  Ct Abdomen Pelvis Wo Contrast  09/25/2014   CLINICAL DATA:  Constipation for 5 days and bilateral leg swelling for 3 weeks. History of COPD and PE. No IV or oral contrast material per order of the referring physician.  EXAM: CT ABDOMEN AND PELVIS WITHOUT CONTRAST  TECHNIQUE: Multidetector CT imaging of the abdomen and pelvis was performed following the standard protocol  without IV contrast.  COMPARISON:  None.  FINDINGS: Small bilateral pleural effusions, greater on the right. Basilar atelectasis. Consolidation in the right lower lung posteriorly probably represents pneumonia. Postoperative changes in the mediastinum. Mild cardiac enlargement.  Small amount of free fluid in the abdomen and extending along the pericolic gutters into the pelvis. This is likely ascites. Increased density in the gallbladder may represent sludge or milk of calcium. No bile duct dilatation. Unenhanced appearance of the liver, spleen, pancreas, adrenal glands, inferior vena cava, and retroperitoneal lymph nodes is unremarkable. Calcifications in both kidneys probably representing vascular calcification. No hydronephrosis or hydroureter. Calcification throughout the abdominal aorta without aneurysm. Stenosis at the aortic bifurcation is probable. Vascular stents in the iliac arteries. Stomach, small bowel, and colon are not abnormally distended. Stool fills the colon. No free air in the abdomen.  Pelvis: Stool-filled rectosigmoid colon. There is some stranding around the sigmoid region which may indicate early changes of acute diverticulitis. No abscess. Bladder wall is not thickened. Prostate gland is not enlarged. Appendix is normal. Small left inguinal hernia containing fat and fluid. Degenerative changes in the spine and hips. Old fracture deformity of the right inferior pubic ramus.  IMPRESSION: Small bilateral pleural effusions greater on the right. Consolidation in the right lower lung suggesting pneumonia. Small amount of abdominal and pelvic free fluid. Increased density suggesting sludge versus milk of calcium in the gallbladder. Mild stranding around the sigmoid colon suggesting early diverticulitis. No abscess. Left inguinal hernia containing fat and fluid.   Electronically Signed   By: Burman Nieves M.D.   On: 09/25/2014 23:34   Dg Chest Port 1 View  09/24/2014   CLINICAL DATA:   Endotracheal tube present.  EXAM: PORTABLE CHEST - 1 VIEW  COMPARISON:  Sep 23, 2014.  FINDINGS: Stable cardiomediastinal silhouette. Status post coronary artery bypass graft. Endotracheal tube is in grossly good position with distal tip 4 cm above the carina. Left internal jugular catheter is unchanged with distal tip overlying expected position of the SVC. Stable bilateral pleural effusions are noted with right greater than left. Stable bilateral lower lobe opacities are also noted. Bony thorax is intact.  IMPRESSION: Stable bilateral lung opacities and pleural effusions. Stable support apparatus.   Electronically Signed   By: Lupita Raider, M.D.   On: 09/24/2014 07:16   Dg Chest Port 1 View  09/23/2014   CLINICAL DATA:  Pneumonia  EXAM: PORTABLE CHEST - 1 VIEW  COMPARISON:  09/22/2014  FINDINGS: Bilateral pleural effusions are  stable. Left midlung airspace disease is improved. Tubular device is stable. No pneumothorax.  IMPRESSION: Improved left midlung airspace disease.  Stable bilateral pleural effusions.   Electronically Signed   By: Jolaine Click M.D.   On: 09/23/2014 08:12   Dg Chest Port 1 View  09/22/2014   CLINICAL DATA:  Central line placement  EXAM: PORTABLE CHEST - 1 VIEW  COMPARISON:  09/22/2014  FINDINGS: Endotracheal tube with tip measuring 4.5 cm above the carinal. Enteric tube tip is off the field of view. Left central venous catheter with tip over the upper SVC region. No pneumothorax. Bilateral pleural effusions greater on the right with basilar atelectasis. Suggestion of developing airspace disease in the left mid lung possibly representing pneumonia. Calcified and tortuous aorta.  IMPRESSION: Appliances appear in satisfactory location. Bilateral pleural effusions, greater on the right. Suggestion of developing infiltration in the left mid lung.   Electronically Signed   By: Burman Nieves M.D.   On: 09/22/2014 05:03   Dg Chest Port 1 View  09/22/2014   CLINICAL DATA:  Endotracheal  tube placement  EXAM: PORTABLE CHEST - 1 VIEW  COMPARISON:  09/20/2014  FINDINGS: Interval placement of an endotracheal tube with tip measuring 4.1 cm above the carina. Postoperative changes in the mediastinum. Cardiac enlargement with mild pulmonary vascular congestion. Bilateral pleural effusions with basilar atelectasis. Mediastinal contours appear intact. Calcification of aorta.  IMPRESSION: Endotracheal tube appears in satisfactory position. Cardiac enlargement with mild pulmonary vascular congestion. Bilateral pleural effusions and basilar atelectasis.   Electronically Signed   By: Burman Nieves M.D.   On: 09/22/2014 02:30   Dg Abd Acute W/chest  09/10/2014   CLINICAL DATA:  Constipation for 5 days. Bilateral leg swelling for 3 weeks. Abdominal pain. History of COPD and PE.  EXAM: DG ABDOMEN ACUTE W/ 1V CHEST  COMPARISON:  Chest 07/10/2014.  Abdomen 07/04/2014.  FINDINGS: Postoperative changes in the mediastinum. Mild cardiac enlargement without vascular congestion. Small bilateral pleural effusions, greater on the right. Bilateral basilar atelectasis. Emphysematous changes in the lungs. No pneumothorax. Calcification of the aorta.  Interval resolution of previous colonic ileus. Diffusely stool-filled colon. No small or large bowel distention. No free intra-abdominal air. No abnormal air-fluid levels. Vascular stents in vascular calcifications present. Degenerative changes in the spine and hips.  IMPRESSION: Cardiac enlargement. Bilateral pleural effusions and basilar atelectasis, greater on the right. Normal nonobstructive bowel gas pattern.   Electronically Signed   By: Burman Nieves M.D.   On: 09/26/2014 22:34    ASSESSMENT AND PLAN  MIKEL HARDGROVE is a 74 y.o. male with a history of chronic combine S/D CHF, CAD s/p CABG x4 (2013), COPD, CKD, HTN, PVD s/p bilat aorto-iliac stenting (2007), carotid artery disease s/p RCEA (2013), CVA, PAF s/p ablation on coumadin, previous LGIB (2014), recent  PE after hip fracture and mod MR who presented [facility] on [facility]/23[facility]2014 w/ constipation and SOB and found to have sepsis 2/2 HCAP and colitis. Patient also noted to have severe coagulopathy with INR >10, acute renal failure and hypotension. He had been improving until 5/12 early am with rapid deterioration with obtundation, bradycardia, and hypoxia (50% O2 sat). Intubated by CRNA for respiratory insufficiency. PCCM asked to see. 2D ECHO revealed new severe MR and recommended TEE to further investigate. Cardiology consulted for new valvular disease and further testing.   Severe MR- 2D ECHO 06/2014 with EF 40% with diffuse HK and mod MR. Limited 2D ECHO this admission with stable LVEF but now  severe MR and suggesting TEE to further investigate. Also 2D ECHO suggestive of RV pressure/volume overload, mod TR and mild pulm HTN.  -- Dr Katrinka BlazingSmith, his primary cardiologist believes he has significant MR based upon exam and prior echo studies. MR is likely related to papillary muscle dysfunction in this patient with h/o prior inferior MI. Furthermore, AF with RVR is not helping the overall situation. Beta blocker therapy has been withdrawn due to hypotension (presumed). Unless there is concern about endocarditis(duration of antibiotic therapy), TEE will not add much and may be harmful given co-morbidities and tenuous hemodynamics. He would not be a candidate for repeat open heart surgery.  AF RVR-  Dr Katrinka BlazingSmith added digoxin injection yesterday to slow rate. Amio is also an option if okay pulmonary wise. Currently rates are much better controlled.   Acute hypoxemic respiratory failure -ventilator dependent currently   Shock - septic vs cardiogenic-->shock resolved. Now off pressors.   Acute on chronic systolic/diastolic CHF - echo 1/61/095/13/16: ef 45% - Continue lasix gtt . Physical exam with evidence of volume overload.   Acute on CKD--> creat improved: 3.37--> 2.9 --> 2.8  Colitis- H/o recent ileus - tolerating tube feeds.  LFT better  Coumadin coagulopathy - INR improved but plat dropped but prob due to sepsis - Follow CBC - Hold coumadin. INR 1.65 today  INFECTIOUS- severe sepsis d/t colitis, and presumed bacterial translocation  prob HCAP BCx2 5/9 >>>neg  UC 5/10 neg Cdif 5/11 > neg Sputum 5/13 >>>  Acute metabolic encephalopathy/Dementia- worsening recently per son. Not even close to baseline currently.   Dispo- goals of care >> continue current medical therapy; no CPR, no defibrillation in the event of cardiac arrest.    Signed, Janetta HoraHOMPSON, Lennox Dolberry R PA-C  Pager [phone]

## 2014-09-26 NOTE — Progress Notes (Signed)
Hypoglycemic Event  CBG: 49  Treatment: D50 IV 25 mL  Symptoms: Pale  Follow-up CBG: Time: 2105  CBG Result: 47  Possible Reasons for Event: Unknown  Comments/MD notified: Melynda RippleELINK    Iniya Matzek J Anushka Hartinger  Remember to initiate Hypoglycemia Order Set & complete

## 2014-09-27 ENCOUNTER — Inpatient Hospital Stay (HOSPITAL_COMMUNITY): Payer: Medicare PPO

## 2014-09-27 DIAGNOSIS — I480 Paroxysmal atrial fibrillation: Secondary | ICD-10-CM | POA: Insufficient documentation

## 2014-09-27 DIAGNOSIS — J948 Other specified pleural conditions: Secondary | ICD-10-CM

## 2014-09-27 DIAGNOSIS — J90 Pleural effusion, not elsewhere classified: Secondary | ICD-10-CM | POA: Insufficient documentation

## 2014-09-27 LAB — GLUCOSE, CAPILLARY
GLUCOSE-CAPILLARY: 101 mg/dL — AB (ref 65–99)
GLUCOSE-CAPILLARY: 97 mg/dL (ref 65–99)
Glucose-Capillary: 131 mg/dL — ABNORMAL HIGH (ref 65–99)
Glucose-Capillary: 80 mg/dL (ref 65–99)
Glucose-Capillary: 86 mg/dL (ref 65–99)
Glucose-Capillary: 87 mg/dL (ref 65–99)
Glucose-Capillary: 92 mg/dL (ref 65–99)

## 2014-09-27 LAB — PROTIME-INR
INR: 1.66 — AB (ref 0.00–1.49)
PROTHROMBIN TIME: 19.8 s — AB (ref 11.6–15.2)

## 2014-09-27 LAB — BASIC METABOLIC PANEL
Anion gap: 11 (ref 5–15)
BUN: 75 mg/dL — ABNORMAL HIGH (ref 6–20)
CO2: 25 mmol/L (ref 22–32)
CREATININE: 2.5 mg/dL — AB (ref 0.61–1.24)
Calcium: 8.1 mg/dL — ABNORMAL LOW (ref 8.9–10.3)
Chloride: 113 mmol/L — ABNORMAL HIGH (ref 101–111)
GFR calc non Af Amer: 24 mL/min — ABNORMAL LOW (ref >60)
GFR, EST AFRICAN AMERICAN: 27 mL/min — AB (ref >60)
Glucose, Bld: 114 mg/dL — ABNORMAL HIGH (ref 65–99)
Potassium: 3.4 mmol/L — ABNORMAL LOW (ref 3.5–5.1)
SODIUM: 149 mmol/L — AB (ref 135–145)

## 2014-09-27 LAB — CBC WITH DIFFERENTIAL/PLATELET
Basophils Absolute: 0 10*3/uL (ref 0.0–0.1)
Basophils Relative: 1 % (ref 0–1)
EOS ABS: 0 10*3/uL (ref 0.0–0.7)
EOS PCT: 0 % (ref 0–5)
HCT: 30.9 % — ABNORMAL LOW (ref 39.0–52.0)
Hemoglobin: 9 g/dL — ABNORMAL LOW (ref 13.0–17.0)
LYMPHS ABS: 1 10*3/uL (ref 0.7–4.0)
Lymphocytes Relative: 22 % (ref 12–46)
MCH: 24.2 pg — AB (ref 26.0–34.0)
MCHC: 29.1 g/dL — ABNORMAL LOW (ref 30.0–36.0)
MCV: 83.1 fL (ref 78.0–100.0)
MONO ABS: 0.5 10*3/uL (ref 0.1–1.0)
MONOS PCT: 12 % (ref 3–12)
Neutro Abs: 2.9 10*3/uL (ref 1.7–7.7)
Neutrophils Relative %: 66 % (ref 43–77)
PLATELETS: 59 10*3/uL — AB (ref 150–400)
RBC: 3.72 MIL/uL — ABNORMAL LOW (ref 4.22–5.81)
RDW: 20.9 % — AB (ref 11.5–15.5)
WBC: 4.4 10*3/uL (ref 4.0–10.5)

## 2014-09-27 LAB — MAGNESIUM: Magnesium: 2 mg/dL (ref 1.7–2.4)

## 2014-09-27 LAB — BODY FLUID CELL COUNT WITH DIFFERENTIAL
Eos, Fluid: 1 %
Lymphs, Fluid: 58 %
Monocyte-Macrophage-Serous Fluid: 28 % — ABNORMAL LOW (ref 50–90)
Neutrophil Count, Fluid: 13 % (ref 0–25)
WBC FLUID: 164 uL (ref 0–1000)

## 2014-09-27 LAB — LACTATE DEHYDROGENASE, PLEURAL OR PERITONEAL FLUID: LD, Fluid: 97 U/L — ABNORMAL HIGH (ref 3–23)

## 2014-09-27 LAB — LACTATE DEHYDROGENASE: LDH: 182 U/L (ref 98–192)

## 2014-09-27 LAB — PHOSPHORUS: Phosphorus: 3 mg/dL (ref 2.5–4.6)

## 2014-09-27 LAB — PROTEIN, TOTAL: Total Protein: 6.3 g/dL — ABNORMAL LOW (ref 6.5–8.1)

## 2014-09-27 LAB — PROTEIN, BODY FLUID: Total protein, fluid: <3 g/dL

## 2014-09-27 LAB — DIGOXIN LEVEL: Digoxin Level: 0.8 ng/mL (ref 0.8–2.0)

## 2014-09-27 LAB — CHOLESTEROL, TOTAL: Cholesterol: 78 mg/dL (ref 0–200)

## 2014-09-27 MED ORDER — POTASSIUM CHLORIDE 20 MEQ/15ML (10%) PO SOLN
40.0000 meq | Freq: Once | ORAL | Status: DC
  Filled 2014-09-27: qty 30

## 2014-09-27 MED ORDER — POTASSIUM CHLORIDE 10 MEQ/100ML IV SOLN
10.0000 meq | INTRAVENOUS | Status: AC
  Administered 2014-09-27 (×4): 10 meq via INTRAVENOUS
  Filled 2014-09-27 (×4): qty 100

## 2014-09-27 NOTE — Progress Notes (Signed)
SLP Cancellation Note  Patient Details Name: Nathan [name] MRN: [id number] DOB: 02/26/1941   Cancelled treatment:       Reason Eval/Treat Not Completed: Patient at procedure or test/unavailable. Pt extubated this morning. Currently preparing for bedside procedure. Will continue efforts.  Sherri Levenhagen B. Murvin NatalBueche, James H. Quillen Va Medical CenterMSP, CCC-SLP [phone]  Nathan [name], [name] 09/27/2014, 3:04 PM

## 2014-09-27 NOTE — Progress Notes (Signed)
eLink Physician-Brief Progress Note Patient Name: Nathan KellMaurice W [name] DOB: 09/10/1940 MRN: [id number]   Date of Service  09/27/2014  HPI/Events of Note  Repeat CXR is unchanged for earlier today post thoracentesis. I strongly suspect that this represents a loculated R pleural collection that has been drained resulting in an airspace that  can not resolve or progress to a usual pneumothorax (apical and lateral air collection). I suspect that this is the result of fibrinous trapping of the lung and may well require VATS for resolution.   eICU Interventions  Continue current management. Re-check CXR in AM. Watch respiratory status closely for indications that the pneumothorax is progressing (Pain, increased SOB or increased O2 requirement).      Intervention Category Intermediate Interventions: Diagnostic test evaluation  Lenell AntuSommer,Duilio Heritage Eugene 09/27/2014, 7:51 PM

## 2014-09-27 NOTE — Progress Notes (Signed)
Date: Sep 27, 2014 Chart reviewed for concurrent status and case management needs. Remains on vent support. Marcelle Smilinghonda Davis, RN, BSN, [place]M   [phone]

## 2014-09-27 NOTE — Progress Notes (Signed)
Received call from radiologist at 1615 regarding the appearance of a possible right anterior pneumothorax s/p thoracentesis. Anders SimmondsPete Babcock, NP and Dr. Vaughan BastaSummer made aware. Patient placed on 2L of O2 and repeat CXR ordered for 1900. All VSS, no signs of distress

## 2014-09-27 NOTE — Procedures (Signed)
Thoracentesis Procedure Note  Pre-operative Diagnosis: right pleural effusion   Post-operative Diagnosis: same  Indications: evaluation of pleural fluid and dyspnea   Procedure Details  Consent: Informed consent was obtained. Risks of the procedure were discussed including: infection, bleeding, pain, pneumothorax.  Under sterile conditions the patient was positioned. Betadine solution and sterile drapes were utilized.  1% buffered lidocaine was used to anesthetize the rib and pleural  Space; which was identified via real time US. Fluid was obtained without any difficulties and minimal blood loss.  A dressing was applied to the wound and wound care instructions were provided.   Findings 1000 ml of orange tinged pleural fluid was obtained. A sample was sent to Pathology for cytology, cell counts, as well as for infection analysis.  Complications:  None; patient tolerated the procedure well.          Condition: stable  Plan A follow up chest x-ray was ordered. Bed Rest for 0 hours. Tylenol 650 mg. for pain.   Levy Pupaobert Quintina Hakeem, MD, PhD 09/28/2014, 8:47 AM [facility] [phone] or if no answer [phone]

## 2014-09-27 NOTE — Evaluation (Signed)
Clinical/Bedside Swallow Evaluation Patient Details  Name: Nathan [name] MRN: [id number] Date of Birth: 09/21/1940  Today's Date: 09/27/2014 Time: SLP Start Time (ACUTE ONLY): 1630 SLP Stop Time (ACUTE ONLY): 1650 SLP Time Calculation (min) (ACUTE ONLY): 20 min  Past Medical History:  Past Medical History  Diagnosis Date  . Aorto-iliac disease     a. s/p bilat with stent 2007 - followed by [facility].  . CAD (coronary artery disease)     a. 10/2011 s/p CABG x4 (LIMA-LAD, SVG-Diag, SVG-OM1 and SVG-PDA)  b. 03/2012 NSTEMI s/p LHC SVG-RCA and LIMA-LAD but total occlusion of SVG-OM as well as SVG-diag. RX therapy recommended   . Hypertension   . ED (erectile dysfunction)   . COPD, severe   . Hyperlipidemia   . Stroke   . GERD (gastroesophageal reflux disease)   . Gout   . Paroxysmal atrial flutter     ablation  . Chronic combined systolic and diastolic CHF (congestive heart failure)     a. Last echo 04/2014: EF 40-45%.  Marland Kitchen. PAF (paroxysmal atrial fibrillation)   . CKD (chronic kidney disease), stage III   . Tobacco abuse   . History of noncompliance with medical treatment   . Carotid artery disease     a. RCEA 2013 - followed by [facility].  . Anemia   . Lower GI bleed     a. LGIB 10/2012: Colonoscopy performed June 21 showed blood in the colon but no definite source identified, diverticular versus AVM suspected. Aspirin stopped.  . Mitral regurgitation   . Hypertensive heart disease   . PE (pulmonary embolism)    Past Surgical History:  Past Surgical History  Procedure Laterality Date  . Spinal fusion    . Hand surgery    . Neck fusion  1975  . Eye surgery    . Coronary artery bypass graft  10/31/2011    Procedure: CORONARY ARTERY BYPASS GRAFTING (CABG);  Surgeon: Delight OvensEdward B Gerhardt, MD;  [facility];  Service: Open Heart Surgery;  Laterality: N/A;  times three using  Left Internal Mammary Artery and Right Greater Saphenous Vein Graft harvested endoscopically; TEE  . Endarterectomy   10/31/2011    Procedure: ENDARTERECTOMY CAROTID;  Surgeon: Nada LibmanVance W Brabham, MD;  [facility];  Service: Vascular;  Laterality: Right;  with patch angioplasty   . Carotid endarterectomy  10-31-2011    right  . Colonoscopy N/A 10/30/2012    Procedure: COLONOSCOPY;  Surgeon: Shirley FriarVincent C. Schooler, MD;  [facility];  Service: Endoscopy;  Laterality: N/A;  . Left heart catheterization with coronary angiogram N/A 09/04/2011    Procedure: LEFT HEART CATHETERIZATION WITH CORONARY ANGIOGRAM;  Surgeon: Lesleigh NoeHenry W Smith III, MD;  [facility];  Service: Cardiovascular;  Laterality: N/A;  . Left heart cath N/A 04/03/2012    Procedure: LEFT HEART CATH;  Surgeon: Lennette Biharihomas A Kelly, MD;  [facility];  Service: Cardiovascular;  Laterality: N/A;  . Pelvic fracture surgery     HPI:  74 year old male admitted 09/25/2014 due to colitis, HCAP, sepsis. Pt deteriorated and was intubated 09/22/14. Extubated 09/27/14.  PMH significant for CVA, GERD, COPD. Orders received for swallow evaluation.   Assessment / Plan / Recommendation Clinical Impression  Pt is edentulous, and dentures are not available at this time. Pt was holding a cup of ice chips/water when SLP entered room. Slight wet voice quality noted. Pt able to cough/clear throat to clear voice quality, however, return of wet voice was noted  intermittently throughout po trials. Strong reflexive cough was not elicited, however, O2 sats were noted to drop intermittently into the upper 80s during this evaluation. Pt has hx CVA, COPD, and was just extubated this morning after 6 days on ventilator. Given pt history and bedside presentation, recommend NPO status until objective study can be completed to evaluate nature and extent of pharyngeal dysphagia and determine least restrictive diet. Discussed with pt, RN, MD.     Aspiration Risk  Moderate    Diet Recommendation NPO   Medication Administration: Crushed with puree    Other  Recommendations  Oral Care Recommendations: Oral care BID   Follow Up Recommendations       Frequency and Duration        Pertinent Vitals/Pain O2 sats dropped intermittently during BSE, no pain reported    SLP Swallow Goals  po tolerance   Swallow Study Prior Functional Status   No history of dysphagia, however, multiple high risk indicators at this time.    General Date of Onset: May 10, 2015 Other Pertinent Information: 74 year old male admitted 10/08/2014 due to colitis, HCAP, sepsis. Pt deteriorated and was intubated 09/22/14. Extubated 09/27/14.  PMH significant for CVA, GERD, COPD. Orders received for swallow evaluation. Type of Study: Bedside swallow evaluation Previous Swallow Assessment: BSE 06/15/14 recommending regular diet/thin liquids Diet Prior to this Study: Dysphagia 3 (soft);Thin liquids Respiratory Status: Supplemental O2 delivered via (comment) History of Recent Intubation: Yes Length of Intubations (days): 6 days Date extubated: 09/27/14 Behavior/Cognition: Alert;Cooperative;Pleasant mood Oral Cavity - Dentition: Edentulous Self-Feeding Abilities: Able to feed self Patient Positioning: Upright in bed Baseline Vocal Quality: Wet;Hoarse Volitional Cough: Strong Volitional Swallow: Unable to elicit    Oral/Motor/Sensory Function Overall Oral Motor/Sensory Function: Appears within functional limits for tasks assessed   Ice Chips Ice chips: Impaired Pharyngeal Phase Impairments: Wet Vocal Quality;Suspected delayed Swallow;Decreased hyoid-laryngeal movement   Thin Liquid Thin Liquid: Impaired Presentation: Cup;Straw Pharyngeal  Phase Impairments: Cough - Delayed;Change in Vital Signs    Nectar Thick Nectar Thick Liquid: Not tested   Honey Thick Honey Thick Liquid: Not tested   Puree Puree: Impaired Presentation: Spoon Pharyngeal Phase Impairments: Cough - Delayed;Change in Vital Signs   Solid   GO    Solid: Impaired Oral Phase Impairments: Impaired mastication (puree given to  moisten cracker) Pharyngeal Phase Impairments: Suspected delayed Swallow;Decreased hyoid-laryngeal movement;Cough - Delayed;Change in Vital Signs      Marien Manship B. ScofieldBueche, MSP, CCC-SLP [phone]  Leigh AuroraBueche, Yanel Dombrosky Brown 09/27/2014,5:19 PM

## 2014-09-27 NOTE — Progress Notes (Signed)
Patient Name: Nathan KellMaurice W Swayne Date of Encounter: 09/27/2014     Principal Problem:   Sepsis Active Problems:   HCAP (healthcare-associated pneumonia)   Colitis   Constipation   AKI (acute kidney injury)   CKD (chronic kidney disease)   Acute systolic congestive heart failure   HLD (hyperlipidemia)   GERD without esophagitis   Shock   Malnutrition of moderate degree   Acute respiratory failure with hypoxia   Shock circulatory   ATN (acute tubular necrosis)   Mitral regurgitation   Abdominal pain    SUBJECTIVE Appears  Much more alert today. He is still ventilated but nods and makes hand gestures. No family present this AM. Still with massive volume overload. Afib rates better controlled.    CURRENT MEDS . antiseptic oral rinse  7 mL Mouth Rinse QID  . atorvastatin  10 mg Oral q1800  . budesonide (PULMICORT) nebulizer solution  0.5 mg Nebulization BID  . carvedilol  1.5625 mg Per Tube BID WC  . chlorhexidine  15 mL Mouth Rinse BID  . digoxin  0.0625 mg Intravenous Daily  . free water  200 mL Per Tube 3 times per day  . ipratropium-albuterol  3 mL Nebulization Q6H  . levofloxacin (LEVAQUIN) IV  750 mg Intravenous Q48H  . pantoprazole sodium  40 mg Per Tube Q24H  . senna  1 tablet Oral Daily  . sodium chloride  3 mL Intravenous Q12H    OBJECTIVE  Filed Vitals:   09/27/14 0630 09/27/14 0700 09/27/14 0727 09/27/14 0735  BP: 113/76 106/44    Pulse: 96 90    Temp: 98.6 F (37 C) 98.6 F (37 C)    TempSrc:      Resp: 28 12    Height:      Weight:      SpO2: 100% 100% 100% 100%    Intake/Output Summary (Last 24 hours) at 09/27/14 0741 Last data filed at 09/27/14 0700  Gross per 24 hour  Intake 2507.86 ml  Output   3630 ml  Net -1122.14 ml   Filed Weights   09/25/14 0500 09/26/14 0500 09/27/14 0500  Weight: 216 lb 0.8 oz (98 kg) 217 lb 6 oz (98.6 kg) 215 lb 9.8 oz (97.8 kg)    PHYSICAL EXAM  General: Well developed, well nourished, male in no acute  distress. Not oriented. Opens his eye to stimulus Head: Eyes PERRLA, No xanthomas. Normocephalic and atraumatic, oropharynx without edema or exudate.  Lungs: ventilated. rhonchourus  Heart: HRRR S1 S2, no rub/gallop, Heart irregular rate and rhythm with S1, S2 murmur. pulses are 2+ extrem.  Neck: No carotid bruits. No lymphadenopathy. +JVD. Abdomen: Bowel sounds present, abdomen soft and non-tender without masses or hernias noted. Msk: No spine or cva tenderness. No weakness, no joint deformities or effusions. Extremities: No clubbing or cyanosis. 3+ LE edema.  Neuro: Alert and oriented X 3. No focal deficits noted. Psych: Good affect, responds appropriately Skin: No rashes or lesions noted.  Accessory Clinical Findings  CBC  Recent Labs  09/26/14 0440 09/27/14 0410  WBC 6.2 4.4  NEUTROABS 4.3 2.9  HGB 9.8* 9.0*  HCT 33.2* 30.9*  MCV 82.8 83.1  PLT 57* 59*   Basic Metabolic Panel  Recent Labs  09/26/14 0440 09/26/14 1030 09/26/14 1810 09/26/14 2211 09/27/14 0410  NA 145 147* 148*  --   --   K 4.5 4.0 3.8  --   --   CL 112* 114* 115*  --   --  CO2 --   --   GLUCOSE 118* 108* 111*  --   --   BUN 79* 76* 77*  --   --   CREATININE 2.80* 2.60* 2.63*  --   --   CALCIUM 8.0* 7.9* 7.9*  --   --   MG 2.2  2.2 2.1  --  2.0 2.0  PHOS 3.3  --   --   --  3.0   Liver Function Tests  Recent Labs  09/26/14 0440  AST 51*  ALT 149*  ALKPHOS 103  BILITOT 2.1*  PROT 5.6*  ALBUMIN 1.9*   TELE  afib with CVR. Freq PVCs. Mult short runs of NSVT noted on tele  Radiology/Studies  Ct Abdomen Pelvis Wo Contrast  09/15/2014   CLINICAL DATA:  Constipation for 5 days and bilateral leg swelling for 3 weeks. History of COPD and PE. No IV or oral contrast material per order of the referring physician.  EXAM: CT ABDOMEN AND PELVIS WITHOUT CONTRAST  TECHNIQUE: Multidetector CT imaging of the abdomen and pelvis was performed following the standard protocol without  IV contrast.  COMPARISON:  None.  FINDINGS: Small bilateral pleural effusions, greater on the right. Basilar atelectasis. Consolidation in the right lower lung posteriorly probably represents pneumonia. Postoperative changes in the mediastinum. Mild cardiac enlargement.  Small amount of free fluid in the abdomen and extending along the pericolic gutters into the pelvis. This is likely ascites. Increased density in the gallbladder may represent sludge or milk of calcium. No bile duct dilatation. Unenhanced appearance of the liver, spleen, pancreas, adrenal glands, inferior vena cava, and retroperitoneal lymph nodes is unremarkable. Calcifications in both kidneys probably representing vascular calcification. No hydronephrosis or hydroureter. Calcification throughout the abdominal aorta without aneurysm. Stenosis at the aortic bifurcation is probable. Vascular stents in the iliac arteries. Stomach, small bowel, and colon are not abnormally distended. Stool fills the colon. No free air in the abdomen.  Pelvis: Stool-filled rectosigmoid colon. There is some stranding around the sigmoid region which may indicate early changes of acute diverticulitis. No abscess. Bladder wall is not thickened. Prostate gland is not enlarged. Appendix is normal. Small left inguinal hernia containing fat and fluid. Degenerative changes in the spine and hips. Old fracture deformity of the right inferior pubic ramus.  IMPRESSION: Small bilateral pleural effusions greater on the right. Consolidation in the right lower lung suggesting pneumonia. Small amount of abdominal and pelvic free fluid. Increased density suggesting sludge versus milk of calcium in the gallbladder. Mild stranding around the sigmoid colon suggesting early diverticulitis. No abscess. Left inguinal hernia containing fat and fluid.   Electronically Signed   By: Burman Nieves M.D.   On: 10/02/2014 23:34   Dg Chest Port 1 View  09/26/2014   CLINICAL DATA:  Hospital  admission for heart failure. Hypertension. COPD.  EXAM: PORTABLE CHEST - 1 VIEW  COMPARISON:  09/24/2014 and 09/22/2014  FINDINGS: Nasogastric tube courses into the stomach and off the inferior portion of the film as tip is not visualized. Endotracheal tube has tip approximately 3.5 cm above the carina. Left IJ central venous catheter is unchanged with tip over the SVC.  Lungs are adequately inflated with persistent moderate size right pleural effusion and small left pleural effusion. Slight worsening airspace opacification over the right mid to lower lung and stable opacification over the left mid to lower lung. Findings likely represent combination of atelectasis as well as vascular congestion, although cannot exclude infection. Cardiomediastinal silhouette  and remainder the exam is unchanged.  IMPRESSION: Slight worsening opacification over the right mid to lower lung with stable opacification of the left mid to lower lung. Findings likely represent combination of atelectasis and vascular congestion although cannot exclude infection. Persistent moderate size right effusion and small left effusion.  Tubes and lines as described.   Electronically Signed   By: Elberta Fortisaniel  Boyle M.D.   On: 09/26/2014 09:57   Dg Chest Port 1 View  09/24/2014   CLINICAL DATA:  Endotracheal tube present.  EXAM: PORTABLE CHEST - 1 VIEW  COMPARISON:  Sep 23, 2014.  FINDINGS: Stable cardiomediastinal silhouette. Status post coronary artery bypass graft. Endotracheal tube is in grossly good position with distal tip 4 cm above the carina. Left internal jugular catheter is unchanged with distal tip overlying expected position of the SVC. Stable bilateral pleural effusions are noted with right greater than left. Stable bilateral lower lobe opacities are also noted. Bony thorax is intact.  IMPRESSION: Stable bilateral lung opacities and pleural effusions. Stable support apparatus.   Electronically Signed   By: Lupita RaiderJames  Green Jr, M.D.   On:  09/24/2014 07:16   Dg Chest Port 1 View  09/23/2014   CLINICAL DATA:  Pneumonia  EXAM: PORTABLE CHEST - 1 VIEW  COMPARISON:  09/22/2014  FINDINGS: Bilateral pleural effusions are stable. Left midlung airspace disease is improved. Tubular device is stable. No pneumothorax.  IMPRESSION: Improved left midlung airspace disease.  Stable bilateral pleural effusions.   Electronically Signed   By: Jolaine ClickArthur  Hoss M.D.   On: 09/23/2014 08:12   Dg Chest Port 1 View  09/22/2014   CLINICAL DATA:  Central line placement  EXAM: PORTABLE CHEST - 1 VIEW  COMPARISON:  09/22/2014  FINDINGS: Endotracheal tube with tip measuring 4.5 cm above the carinal. Enteric tube tip is off the field of view. Left central venous catheter with tip over the upper SVC region. No pneumothorax. Bilateral pleural effusions greater on the right with basilar atelectasis. Suggestion of developing airspace disease in the left mid lung possibly representing pneumonia. Calcified and tortuous aorta.  IMPRESSION: Appliances appear in satisfactory location. Bilateral pleural effusions, greater on the right. Suggestion of developing infiltration in the left mid lung.   Electronically Signed   By: Burman NievesWilliam  Stevens M.D.   On: 09/22/2014 05:03   Dg Chest Port 1 View  09/22/2014   CLINICAL DATA:  Endotracheal tube placement  EXAM: PORTABLE CHEST - 1 VIEW  COMPARISON:  07-17-14  FINDINGS: Interval placement of an endotracheal tube with tip measuring 4.1 cm above the carina. Postoperative changes in the mediastinum. Cardiac enlargement with mild pulmonary vascular congestion. Bilateral pleural effusions with basilar atelectasis. Mediastinal contours appear intact. Calcification of aorta.  IMPRESSION: Endotracheal tube appears in satisfactory position. Cardiac enlargement with mild pulmonary vascular congestion. Bilateral pleural effusions and basilar atelectasis.   Electronically Signed   By: Burman NievesWilliam  Stevens M.D.   On: 09/22/2014 02:30   Dg Abd Acute  W/chest  10/09/2014   CLINICAL DATA:  Constipation for 5 days. Bilateral leg swelling for 3 weeks. Abdominal pain. History of COPD and PE.  EXAM: DG ABDOMEN ACUTE W/ 1V CHEST  COMPARISON:  Chest 07/10/2014.  Abdomen 07/04/2014.  FINDINGS: Postoperative changes in the mediastinum. Mild cardiac enlargement without vascular congestion. Small bilateral pleural effusions, greater on the right. Bilateral basilar atelectasis. Emphysematous changes in the lungs. No pneumothorax. Calcification of the aorta.  Interval resolution of previous colonic ileus. Diffusely stool-filled colon. No small or large bowel  distention. No free intra-abdominal air. No abnormal air-fluid levels. Vascular stents in vascular calcifications present. Degenerative changes in the spine and hips.  IMPRESSION: Cardiac enlargement. Bilateral pleural effusions and basilar atelectasis, greater on the right. Normal nonobstructive bowel gas pattern.   Electronically Signed   By: Burman Nieves M.D.   On: Sep 23, 2014 22:34    ASSESSMENT AND PLAN  ALONZA KNISLEY is a 74 y.o. male with a history of chronic combine S/D CHF, CAD s/p CABG x4 (2013), COPD, CKD, HTN, PVD s/p bilat aorto-iliac stenting (2007), carotid artery disease s/p RCEA (2013), CVA, PAF s/p ablation on coumadin, previous LGIB (2014), recent PE after hip fracture and mod MR who presented to [facility] on 2014/09[facility]23 w/ constipation and SOB and found to have sepsis 2/2 HCAP and colitis. Patient also noted to have severe coagulopathy with INR >10, acute renal failure and hypotension. He had been improving until 5/12 early am with rapid deterioration with obtundation, bradycardia, and hypoxia (50% O2 sat). Intubated by CRNA for respiratory insufficiency. PCCM asked to see. 2D ECHO revealed new severe MR and recommended TEE to further investigate. Cardiology consulted for new valvular disease and further testing.   Severe MR- 2D ECHO 06/2014 with EF 40% with diffuse HK and mod MR. Limited 2D ECHO  this admission with stable LVEF but now severe MR and suggesting TEE to further investigate. Also 2D ECHO suggestive of RV pressure/volume overload, mod TR and mild pulm HTN.  -- Dr Katrinka Blazing, his primary cardiologist believes he has significant MR based upon exam and prior echo studies. MR is likely related to papillary muscle dysfunction in this patient with h/o prior inferior MI. Furthermore, AF with RVR is not helping the overall situation. Beta blocker therapy has been withdrawn due to hypotension (presumed). Unless there is concern about endocarditis(duration of antibiotic therapy), TEE will not add much and may be harmful given co-morbidities and tenuous hemodynamics. He would not be a candidate for repeat open heart surgery.  AF RVR-  Will continue dig to control VR to atrial fib. Only three doses given 09/25/14. No dig yesterday. Dig level 0.9 yesterday AM. Will start .0625 mg daily today. Currently rates are much better controlled.  -- Started on low dose of BB: Coreg 1.5625 yesterday, but this was held due to low BP and HR. Should be able to tolerate currently  NSVT- multiple short runs noted on tele. Continue BB.   Acute hypoxemic respiratory failure -ventilator dependent currently. PCCM hoping to wean soon as his delerium is wearing off.   Shock - septic vs cardiogenic-->shock resolved. Now off pressors.   Acute on chronic systolic/diastolic CHF - echo 1/61/09: ef 45% - Continue lasix gtt . Physical exam with evidence of volume overload.   Acute on CKD--> creat improved: 3.37--> 2.9 --> 2.8--> 2.6  Coumadin coagulopathy - Hold coumadin. INR 1.66 today  Acute metabolic encephalopathy/Dementia- worsening recently per son. Mental status has improved a lot today.   Dispo- goals of care >> continue current medical therapy; no CPR, no defibrillation in the event of cardiac arrest.    Signed, Janetta Hora PA-C  Pager [phone]

## 2014-09-27 NOTE — Progress Notes (Signed)
Patient placed on 100% O2 , suctioned for a moderate amt. Thick tan secretion, cuff deflated, and extubated to a 2lpm [place]. HR 104, RR 21, O2 sat 100%. Will continue to monitor. Patient verbalizing well.

## 2014-09-27 NOTE — Progress Notes (Addendum)
Pharmacy - Coumadin dosing  Assessment: Patient's a 74 y.o M presented to the ED on 5/9 with c/o constipation and emesis. He was on coumadin PTA for afib and PE with INR supratherapeutic at 9 on admit and was given multiple vit K doses for reversal.  Patient was intubated 5/13, with plan is for extubation today per CCM.  INR currently subtherapeutic, but platelets have dropped over course of hospital stay, currently at 59.   Plan:  Continue to hold coumadin as per discussion with Cardiology (Dr. Katrinka BlazingSmith)  Cardiology plans to order HIT panel   F/u further anticoagulation plans  F/u CBC, INR   Greer PickerelJigna Aldonia Keeven, PharmD, BCPS Pager: [phone] 09/27/2014 2:55 PM

## 2014-09-27 NOTE — Progress Notes (Signed)
PULMONARY / CRITICAL CARE MEDICINE   Name: Nathan [name] MRN: [id number] DOB: 26-Aug-1940    ADMISSION DATE:  10/07/2014 CONSULTATION DATE:  09/22/2014  REFERRING MD :  Rito Ehrlich  CHIEF COMPLAINT:  Respiratory failure  INITIAL PRESENTATION: 74 year old male w/ PMH:  PAF on coumadin, GOLD C COPD, CAD, HTN, systolic/diastolic CHF, CKD III, and PE. He was recently admitted for hip fracture, which was to be managed conservatively. During that admission he was found to have pulmonary embolism and started on heprain >> coumadin. Admitted 5/9 w/ working dx of colitis, HCAP and sepsis w/ elevated lactate. 5/12 early AM he developed bradycardia, hypoxia with sats around 50, and became obtunded. He was intubated by CRNA. PCCM called for consultation.    STUDIES:  Admission CXR > bilateral effusions R>L. 09/30/2014 5/9 CT abd > Small bilateral pleural effusions greater on the right. Consolidation in the right lower lung suggesting pneumonia. Small amount of abdominal and pelvic free fluid. Increased density suggesting sludge versus milk of calcium in the gallbladder. Mild stranding around the sigmoid colon suggesting early diverticulitis.Left inguinal hernia containing fat and fluid. ECHO  Normal LV size with mild LV hypertrophy. EF 45-50% with mild diffusehypokinesis. D-shaped interventricular septum suggests RV pressure/volume overload. Mildly dilated RV with normal systolic function. Moderate TR. Mild pulmonary hypertension. MR appeared severe.   EVENTS Admitted 5/9: colitis, HCAP, sepsis w/ elevated lactate and coagulopathy 5/11: still hypotensive, wbc rising. Aggressive volume resuscitation provided.  5/12 cdiff negative. Felt sepsis from bacterial translocation from gut. ABX continued. More lethargic  5/13: rapid deterioration. he developed bradycardia, hypoxia with sats around 50, and became obtunded. He was intubated by CRNA. In septic shock, placed on pressors  5/14: still on pressors. Severe MR  on echo. Creatinine improved.  5/15 platelets dropped 68, weaning pressors. More awake, but failed SBT. Tolerating tubefeeds.  5/16: off pressors. Failed SBT. Started lasix gtt. Narrowed abx. Seen by cards. They felt MR was due to MR is likely related to papillary muscle dysfunction in this patient with h/o prior inferior MI. Did not think surgical candidate and did not think TEE would add to rx. They added digoxin.  5/17 added precedex, more awake. Changed CTX to levaquin given rash and itching. Changed fentanyl to low dose dilaudid. Increased lasix as did not achieve negative fluid balance.  5/18 cr a little increased, na increased, lasix gtt stopped.   VITAL SIGNS: Temp:  [95.7 F (35.4 C)-98.6 F (37 C)] 98.4 F (36.9 C) (05/18 0800) Pulse Rate:  [66-102] 95 (05/18 0800) Resp:  [12-29] 14 (05/18 0800) BP: (83-150)/(28-122) 132/57 mmHg (05/18 0800) SpO2:  [70 %-100 %] 100 % (05/18 0800) FiO2 (%):  [30 %] 30 % (05/18 0800) Weight:  [97.8 kg (215 lb 9.8 oz)] 97.8 kg (215 lb 9.8 oz) (05/18 0500) HEMODYNAMICS: CVP:  [17 mmHg-19 mmHg] 17 mmHg VENTILATOR SETTINGS: Vent Mode:  [-] PSV;CPAP FiO2 (%):  [30 %] 30 % Set Rate:  [12 bmp] 12 bmp Vt Set:  [640 mL] 640 mL PEEP:  [5 cmH20] 5 cmH20 Pressure Support:  [12 cmH20] 12 cmH20 Plateau Pressure:  [20 cmH20-22 cmH20] 20 cmH20 INTAKE / OUTPUT:  Intake/Output Summary (Last 24 hours) at 09/27/14 1037 Last data filed at 09/27/14 0800  Gross per 24 hour  Intake 2359.86 ml  Output   3730 ml  Net -1370.14 ml    PHYSICAL EXAMINATION: General:  Elderly male, awake, more appropriate  Neuro:  RASS 0,moves all ext. Oriented X 4 HEENT:  [place]/AT, no JVD noted, orally intubated  Cardiovascular: Tachy, irregular Lungs:  Coarse bilateral rhonchi, decreased right base  Abdomen:  Soft, distended, fluctuant ascites, hyperactive BS Musculoskeletal:  No acute deformity, peripheral pulses intact Skin:  + 4 pitting edema to BLE, evolving  rash  LABS:  PULMONARY  Recent Labs Lab 09/22/14 0225 09/22/14 0445 09/23/14 0355 09/26/14 1023  PHART 7.251* 7.371 7.498* 7.482*  PCO2ART 29.0* 24.6* 24.7* 27.4*  PO2ART 328* 213* 122* 81.6  HCO3 12.3* 13.9* 19.4* 20.3  TCO2 11.7 13.0 18.0 18.7  O2SAT 99.7 99.7 99.1 96.8   CBC  Recent Labs Lab 09/25/14 0510 09/26/14 0440 09/27/14 0410  HGB 9.7* 9.8* 9.0*  HCT 31.8* 33.2* 30.9*  WBC 5.7 6.2 4.4  PLT 57* 57* 59*   COAGULATION  Recent Labs Lab 09/23/14 0405 09/24/14 0500 09/25/14 0510 09/26/14 0440 09/27/14 0410  INR 2.66* 1.84* 1.77* 1.65* 1.66*   CARDIAC    Recent Labs Lab 09/22/14 0206 09/22/14 1130 09/22/14 1826  TROPONINI 0.08* 0.08* 0.08*   No results for input(s): PROBNP in the last 168 hours.   CHEMISTRY  Recent Labs Lab 09/23/14 0405 09/24/14 0500 09/25/14 0510  09/25/14 1800 09/26/14 0440 09/26/14 1030 09/26/14 1810 09/26/14 2211 09/27/14 0410 09/27/14 0930  NA 142 142 143  < > 144 145 147* 148*  --   --  149*  K 3.0* 3.1* 2.9*  < > 5.4* 4.5 4.0 3.8  --   --  3.4*  CL 111 108 111  < > 113* 112* 114* 115*  --   --  113*  CO2 22 21* 22  < > 21* --   --  25  GLUCOSE 119* 112* 112*  < > 115* 118* 108* 111*  --   --  114*  BUN 84* 77* 76*  < > 79* 79* 76* 77*  --   --  75*  CREATININE 3.47* 3.37* 2.90*  < > 2.92* 2.80* 2.60* 2.63*  --   --  2.50*  CALCIUM 7.4* 7.4* 7.6*  < > 7.9* 8.0* 7.9* 7.9*  --   --  8.1*  MG 1.9 1.9 2.1  < > 2.2 2.2  2.2 2.1  --  2.0 2.0  --   PHOS 3.9 3.6 3.2  --   --  3.3  --   --   --  3.0  --   < > = values in this interval not displayed. Estimated Creatinine Clearance: 31.5 mL/min (by C-G formula based on Cr of 2.5).  LIVER  Recent Labs Lab 09/22/14 0206 09/22/14 0530 09/23/14 0405 09/24/14 0500 09/25/14 0510 09/26/14 0440 09/27/14 0410  AST 490* 385* 226*  --   --  51*  --   ALT 607* 496* 375*  --   --  149*  --   ALKPHOS 110 97 79  --   --  103  --   BILITOT 3.0* 3.0* 3.0*  --    --  2.1*  --   PROT 6.2* 5.3* 4.6*  --   --  5.6*  --   ALBUMIN 2.4* 2.1* 1.8*  --   --  1.9*  --   INR 4.41* 4.36* 2.66* 1.84* 1.77* 1.65* 1.66*     INFECTIOUS  Recent Labs Lab 09/22/14 0206 09/22/14 0215 09/22/14 0530 09/23/14 0405 09/24/14 0500  LATICACIDVEN  --  5.6* 4.3* 1.7  --   PROCALCITON 0.69  --   --  0.69 0.52  ENDOCRINE CBG (last 3)   Recent Labs  09/26/14 2332 09/27/14 0325 09/27/14 0742  GLUCAP 97 86 87    IMAGING x48h  Dg Chest Port 1 View  09/27/2014   CLINICAL DATA:  SOB today; f/u right pleural effusion  EXAM: PORTABLE CHEST - 1 VIEW  COMPARISON:  the previous day's study  FINDINGS: Endotracheal tube, nasogastric tube, left IJ central line stable in position. Previous CABG. Stable mild cardiomegaly. Bilateral pleural effusions right greater than left unchanged. Patchy atelectasis or infiltrates in both lung bases persist. Mild central pulmonary vascular congestion.  IMPRESSION: 1. Stable pleural effusions and bilateral infiltrates or edema. 2.  Support hardware stable in position.   Electronically Signed   By: Corlis Leak  Hassell M.D.   On: 09/27/2014 09:03   Dg Chest Port 1 View  09/26/2014   CLINICAL DATA:  Hospital admission for heart failure. Hypertension. COPD.  EXAM: PORTABLE CHEST - 1 VIEW  COMPARISON:  09/24/2014 and 09/22/2014  FINDINGS: Nasogastric tube courses into the stomach and off the inferior portion of the film as tip is not visualized. Endotracheal tube has tip approximately 3.5 cm above the carina. Left IJ central venous catheter is unchanged with tip over the SVC.  Lungs are adequately inflated with persistent moderate size right pleural effusion and small left pleural effusion. Slight worsening airspace opacification over the right mid to lower lung and stable opacification over the left mid to lower lung. Findings likely represent combination of atelectasis as well as vascular congestion, although cannot exclude infection. Cardiomediastinal  silhouette and remainder the exam is unchanged.  IMPRESSION: Slight worsening opacification over the right mid to lower lung with stable opacification of the left mid to lower lung. Findings likely represent combination of atelectasis and vascular congestion although cannot exclude infection. Persistent moderate size right effusion and small left effusion.  Tubes and lines as described.   Electronically Signed   By: Elberta Fortisaniel  Boyle M.D.   On: 09/26/2014 09:57   I had personally reviewed the films.   ASSESSMENT / PLAN:  PULMONARY OETT 5/13 >>>4/18 A: Acute hypoxemic respiratory failure -ventilator dependent COPD without acute exacerbation Bilateral effusions R>L effusion  Suspected HCAP  P:   Extubate, wean FIO2 NPO and adv as tol Cont pulm hygiene Keep even volume status for now  Therapeutic and diagnostic thoracentesis today  Repeat pcxr in am   CARDIOVASCULAR CVL 5/13 >>> A:  Shock - septic vs cardiogenic-->shock resolved  AF RVR Acute on chronic systolic/diastolic CHF - echo 9/14/785/13/16: ef 45% Severe MR: MR is likely related to papillary muscle dysfunction in this patient with h/o prior inferior MI. >looks like even vol status currently  P:  DC lasix  Serial chemistries w/ mg Cont tele  Pharmacy following INR will need to eventually go back on coumadin  Dig added; continue  Cards does not think TEE good choice given not surgical candidate.   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 SCr bumped up a little  Hypernatremia s/p lasix Hyperkalemia    P:   Strict I&O Dc lasix Cont free water Replace K  GASTROINTESTINAL A:   Transaminitis, ? Shock liver-->improved  Colitis H/o recent ileus  - tolerating tube feeds. LFT better  P:   Follow LFT intermittently  Adv diet  D/c PPI   HEMATOLOGIC A:   Coumadin coagulopathy Anemia, appears chronic   - INR improved but plat dropped but prob due to sepsis P:  Follow CBC Hold coumadin given low  platelets  Follow Coags and platelet PAS  INFECTIOUS BCx2 5/9 >>>neg  UC 5/10 neg Cdif 5/11 > neg Sputum 5/13 >>>med candida  A:   Severe sepsis d/t colitis, and presumed bacterial translocation  prob HCAP P:   Abx: Zosyn, start date 5/10 >>>5/16 Abx: Vancomycin, start date 5/10 >>>5/16 abx rocephin 5/16>>>5/17 (stopped d/t itching) levaquin 5/17>>>  ENDOCRINE A:   No acute issues P:   Follow glucose on Bmet Add SSI if consistently greater than 180.  NEUROLOGIC A:   Acute metabolic encephalopathy-->resolved   P:   Dc sedating meds  Derm A: rash/urticaria  P: Changed abx to levaquin Changed fentanyl to low dose dilaudid  PRN benadryl   FAMILY  IDT team done by Dr Craige CottaSood / PA  09/22/14 : Long discussion with wife. Expressed to her that he is critically ill and is requiring multiple forms of life support. She voiced an understanding of this. She states that he is tired of being sick and would not want to continue living in a debilitated state. She supports continuing aggressive medical therapies, but would prefer to no put him through and CPR/ACLS should he suffer a cardiac arrest.   09/27/2014 10:37 AM  Looks better. Extubated. Hemodynamically stable. Will look at right chest, cosider thora.   Simonne MartinetPeter E Babcock ACNP-BC [facility] Pager # [phone] OR # [phone] if no answer 09/27/2014 10:37 AM  Attending Note:  I have examined patient, reviewed labs, studies and notes. I have discussed the case with Kreg ShropshireP Babcock, and I agree with the data and plans as amended above. Better performance on SBT and MS today. Has been diuresed further last 24h. Nods to questions and denies discomfort on my exam. Plan for extubation today, continue levaquin. Push PT and pulm hygiene.  Independent critical care time is 40 minutes.   Levy Pupaobert Byrum, MD, PhD 09/27/2014, 12:50 PM [facility] [phone] or if no answer [phone]

## 2014-09-27 NOTE — Care Management Note (Signed)
Case Management Note  Patient Details  Name: Nathan [name] MRN: [id number] Date of Birth: 12/02/1940  Subjective/Objective:                 On vent post op   Action/Plan:   Expected Discharge Date:   (UNKNOWN)               Expected Discharge Plan:  Home/Self Care  In-House Referral:  NA  Discharge planning Services  CM Consult  Post Acute Care Choice:  NA Choice offered to:  NA  DME Arranged:    DME Agency:     HH Arranged:    HH Agency:     Status of Service:  In process, will continue to follow  Medicare Important Message Given:    Date Medicare IM Given:    Medicare IM give by:    Date Additional Medicare IM Given:    Additional Medicare Important Message give by:     If discussed at Long Length of Stay Meetings, dates discussed:    Additional Comments:  Golda AcreDavis, Rhonda Lynn, RN 09/27/2014, 8:55 AM

## 2014-09-28 ENCOUNTER — Inpatient Hospital Stay (HOSPITAL_COMMUNITY): Payer: Medicare PPO

## 2014-09-28 LAB — BASIC METABOLIC PANEL
Anion gap: 9 (ref 5–15)
BUN: 77 mg/dL — AB (ref 6–20)
CALCIUM: 8.5 mg/dL — AB (ref 8.9–10.3)
CO2: 25 mmol/L (ref 22–32)
Chloride: 113 mmol/L — ABNORMAL HIGH (ref 101–111)
Creatinine, Ser: 2.42 mg/dL — ABNORMAL HIGH (ref 0.61–1.24)
GFR calc Af Amer: 28 mL/min — ABNORMAL LOW (ref >60)
GFR calc non Af Amer: 25 mL/min — ABNORMAL LOW (ref >60)
Glucose, Bld: 81 mg/dL (ref 65–99)
Potassium: 4.1 mmol/L (ref 3.5–5.1)
Sodium: 147 mmol/L — ABNORMAL HIGH (ref 135–145)

## 2014-09-28 LAB — CBC
HEMATOCRIT: 33.9 % — AB (ref 39.0–52.0)
Hemoglobin: 9.7 g/dL — ABNORMAL LOW (ref 13.0–17.0)
MCH: 24.6 pg — ABNORMAL LOW (ref 26.0–34.0)
MCHC: 28.6 g/dL — ABNORMAL LOW (ref 30.0–36.0)
MCV: 86 fL (ref 78.0–100.0)
Platelets: 75 10*3/uL — ABNORMAL LOW (ref 150–400)
RBC: 3.94 MIL/uL — AB (ref 4.22–5.81)
RDW: 21.5 % — AB (ref 11.5–15.5)
WBC: 5.7 10*3/uL (ref 4.0–10.5)

## 2014-09-28 LAB — PROTIME-INR
INR: 1.51 — ABNORMAL HIGH (ref 0.00–1.49)
PROTHROMBIN TIME: 18.4 s — AB (ref 11.6–15.2)

## 2014-09-28 LAB — GLUCOSE, CAPILLARY
GLUCOSE-CAPILLARY: 82 mg/dL (ref 65–99)
Glucose-Capillary: 70 mg/dL (ref 65–99)
Glucose-Capillary: 70 mg/dL (ref 65–99)
Glucose-Capillary: 73 mg/dL (ref 65–99)
Glucose-Capillary: 71 mg/dL (ref 65–99)

## 2014-09-28 LAB — RHEUMATOID FACTORS, FLUID: RHEUMATOID ARTHRITIS, QN/FLUID: NEGATIVE

## 2014-09-28 LAB — HEPARIN INDUCED THROMBOCYTOPENIA PNL: Heparin Induced Plt Ab: 0.216 OD (ref 0.000–0.400)

## 2014-09-28 MED ORDER — GERHARDT'S BUTT CREAM
TOPICAL_CREAM | Freq: Three times a day (TID) | CUTANEOUS | Status: DC
  Administered 2014-09-28 – 2014-09-29 (×3): via TOPICAL
  Administered 2014-09-29 – 2014-09-30 (×2): 1 via TOPICAL
  Administered 2014-09-30: 23:00:00 via TOPICAL
  Administered 2014-10-01: 1 via TOPICAL
  Administered 2014-10-01 – 2014-10-03 (×6): via TOPICAL
  Filled 2014-09-28: qty 1

## 2014-09-28 MED ORDER — CARVEDILOL 3.125 MG PO TABS
3.1250 mg | ORAL_TABLET | Freq: Two times a day (BID) | ORAL | Status: DC
Start: 2014-09-28 — End: 2014-10-03
  Administered 2014-09-28 – 2014-10-02 (×8): 3.125 mg
  Filled 2014-09-28 (×9): qty 1

## 2014-09-28 NOTE — Progress Notes (Signed)
Nutrition Follow-up  DOCUMENTATION CODES:  Non-severe (moderate) malnutrition in context of chronic illness as evidenced by moderate depletion of body fat and muscle mass.  INTERVENTION: - Diet advancement per MD as medically tolerated. - Once diet advanced, add Resource Breeze po BID, each supplement provides 250 kcal and 9 grams of protein - RD will continue to monitor.   NUTRITION DIAGNOSIS:  Inadequate oral intake related to inability to eat as evidenced by NPO status.  ongoing  GOAL:  Patient will meet greater than or equal to 90% of their needs  Not met  MONITOR:  Vent status, TF tolerance, Diet advancement, Labs, Weight trends  REASON FOR ASSESSMENT:  Consult Enteral/tube feeding initiation and management  ASSESSMENT: 74 year old male presented to ED 5/9 c/o constipation x 3 days. Admitted for sepsis 2nd to colitis/HCAP. Initially with elevated lactic. AF RVR on coumadin. INR > 10 at presentation. Had been improving until 5/12 early am with rapid deterioration with obtundation, bradycardia, and hypoxia (50% O2 sat). Intubated by CRNA for respiratory insufficiency.  Pt extubated 5/18. Asking for food and ginger ale. SLP eval pending.  Per RN, pt tolerated meds with applesauce this am.  Pt reports poor po prior to admission with unknown amount of weight loss.  Currently +13 L. Wt on admission 162 lbs. Current weight 206 lbs. Pt reports usual body weight as ~170 lbs.  RD will continue to monitor and add supplements once diet advanced.   Labs and medications reviewed  Na 147  BUN 77  Ca 8.5  Height:  Ht Readings from Last 1 Encounters:  09/24/14 6' 1" (1.854 m)    Weight:  Wt Readings from Last 1 Encounters:  09/28/14 206 lb 5.6 oz (93.6 kg)    Ideal Body Weight:  83.6 kg (Calculated using admission wt)  Wt Readings from Last 10 Encounters:  09/28/14 206 lb 5.6 oz (93.6 kg)  08/23/14 162 lb (73.483 kg)  08/14/14 156 lb 6.4 oz (70.943 kg)  08/11/14  157 lb 12.8 oz (71.578 kg)  07/31/14 167 lb 1.6 oz (75.796 kg)  07/11/14 165 lb (74.844 kg)  06/23/14 186 lb (84.369 kg)  06/20/14 200 lb (90.719 kg)  06/18/14 187 lb 12.8 oz (85.186 kg)  06/06/14 183 lb (83.008 kg)    BMI:  Body mass index is 27.23 kg/(m^2).  Estimated Nutritional Needs:  Kcal:  1800-2000  Protein:  110-115 g  Fluid:  1.7 L/day  Skin:  Wound (see comment) (Stage II wound on buttocks)  Diet Order:  Diet NPO time specified  EDUCATION NEEDS:  Education needs no appropriate at this time   Intake/Output Summary (Last 24 hours) at 09/28/14 1229 Last data filed at 09/28/14 0700  Gross per 24 hour  Intake    490 ml  Output    830 ml  Net   -340 ml    Last BM:  5/19    MS, RD, LDN [phone] 

## 2014-09-28 NOTE — Progress Notes (Signed)
eLink Physician-Brief Progress Note Patient Name: Allen KellMaurice W Bradby DOB: 02/04/1941 MRN: [id number]   Date of Service  09/28/2014  HPI/Events of Note  1. Gluteal excoriation per rn 2. Failed swallow  eICU Interventions  1. Gerhardt butt cream 2. Insert panda     Intervention Category Intermediate Interventions: Other:  Zyire Eidson 09/28/2014, 7:02 PM

## 2014-09-28 NOTE — Progress Notes (Signed)
Patient Name: Nathan [name] Date of Encounter: 09/28/2014  Principal Problem:   Sepsis Active Problems:   HCAP (healthcare-associated pneumonia)   Colitis   Constipation   AKI (acute kidney injury)   CKD (chronic kidney disease)   Acute systolic congestive heart failure   HLD (hyperlipidemia)   GERD without esophagitis   Shock   Malnutrition of moderate degree   Acute respiratory failure with hypoxia   Shock circulatory   ATN (acute tubular necrosis)   Mitral regurgitation   Abdominal pain   Paroxysmal atrial fibrillation   Pleural effusion, right   Primary Cardiologist: Dr Katrinka Blazing  Patient Profile: 74 y.o. male with hx chronic S/D CHF, CABG x4 (2013), COPD, CKD, HTN, PVD s/p bilat aorto-iliac stenting (2007), RCEA (2013), CVA, PAF s/p ablation on coumadin, LGIB (2014), PE & hip fx 06/2014, and mod MR admitted 09/25/2014 w/ constipation, SOB, sepsis 2/2 HCAP and colitis, severe coagulopathy with INR >10, ARF and hypotension. Intubated 05/12>05/18, effusions, s/p tap 05/18 1000 cc out, but had small PTX.  SUBJECTIVE: Breathing better, talking well. Speaking in complete sentences on O2 by cannula  OBJECTIVE Filed Vitals:   09/28/14 0400 09/28/14 0500 09/28/14 0600 09/28/14 0700  BP: 129/66 91/70 116/86 140/79  Pulse: 92 89 31 91  Temp: 97.5 F (36.4 C) 97.3 F (36.3 C) 97.5 F (36.4 C) 97.5 F (36.4 C)  TempSrc: Core (Comment)     Resp: Height:      Weight:  206 lb 5.6 oz (93.6 kg)    SpO2: 99% 94% 91% 100%    Intake/Output Summary (Last 24 hours) at 09/28/14 0759 Last data filed at 09/28/14 0700  Gross per 24 hour  Intake    880 ml  Output   1480 ml  Net   -600 ml   Filed Weights   09/26/14 0500 09/27/14 0500 09/28/14 0500  Weight: 217 lb 6 oz (98.6 kg) 215 lb 9.8 oz (97.8 kg) 206 lb 5.6 oz (93.6 kg)    PHYSICAL EXAM General: Well developed, well nourished, male in no acute distress. Head: Normocephalic, atraumatic.  Neck: Supple without  bruits, JVD 9 cm. Lungs:  Resp regular and unlabored, decreased BS bases w/ rales. Heart: Irreg irreg, S1, S2, no S3, S4, 3/6 murmur; no rub. Abdomen: Soft, non-tender, + distended, BS not heard.  Extremities: No clubbing, cyanosis, 2-3+ pedal edema.  Neuro: Alert and oriented X 2. Moves all extremities spontaneously. Remembers pretty well, still w/ some confusion. Psych: Normal affect.  LABS: CBC: Recent Labs  09/26/14 0440 09/27/14 0410 09/28/14 0440  WBC 6.2 4.4 5.7  NEUTROABS 4.3 2.9  --   HGB 9.8* 9.0* 9.7*  HCT 33.2* 30.9* 33.9*  MCV 82.8 83.1 86.0  PLT 57* 59* 75*   INR: Recent Labs  09/28/14 0440  INR 1.51*   Basic Metabolic Panel: Recent Labs  09/26/14 0440  09/26/14 2211 09/27/14 0410 09/27/14 0930 09/28/14 0440  NA 145  < >  --   --  149* 147*  K 4.5  < >  --   --  3.4* 4.1  CL 112*  < >  --   --  113* 113*  CO2 22  < >  --   --  25 25  GLUCOSE 118*  < >  --   --  114* 81  BUN 79*  < >  --   --  75* 77*  CREATININE 2.80*  < >  --   --  2.50* 2.42*  CALCIUM 8.0*  < >  --   --  8.1* 8.5*  MG 2.2  2.2  < > 2.0 2.0  --   --   PHOS 3.3  --   --  3.0  --   --   < > = values in this interval not displayed. Liver Function Tests: Recent Labs  09/26/14 0440 09/27/14 1645  AST 51*  --   ALT 149*  --   ALKPHOS 103  --   BILITOT 2.1*  --   PROT 5.6* 6.3*  ALBUMIN 1.9*  --    BNP:  B NATRIURETIC PEPTIDE  Date/Time Value Ref Range Status  09/17/2014 09:59 PM 1350.8* 0.0 - 100.0 pg/mL Final  07/02/2014 04:10 AM 703.3* 0.0 - 100.0 pg/mL Final   Fasting Lipid Panel: Recent Labs  09/27/14 1645  CHOL 78    DIGOXIN LEVEL  Date Value Ref Range Status  09/27/2014 0.8 0.8 - 2.0 ng/mL Final    TELE:  Atrial fib, rate generally controlled, no pauses > 3 sec, no HR sustained < 50. 1 period of slower HR, but not sustained < 50.  1 run of NSVT, 8 bts   Radiology/Studies: Dg Chest Port 1 View 09/28/2014   CLINICAL DATA:  Pleural effusion.  EXAM: PORTABLE  CHEST - 1 VIEW  COMPARISON:  09/27/2014.  FINDINGS: Left IJ line noted in good anatomic position. Mediastinum hilar structures normal. Cardiomegaly with normal pulmonary vascularity. Prior CABG. Interim slight improvement of bibasilar atelectasis and/or infiltrates. Interim slight improvement of bilateral pleural effusions. Persistent but improved questionable medial right base pneumothorax.  IMPRESSION: 1. Left IJ line in stable position. 2. Prior CABG.Stable cardiomegaly.  Pulmonary vascularity normal . 3. Slight improvement of bibasilar atelectasis and/or infiltrates. Slight improvement of bilateral pleural effusions. 4. Slight improvement of questionable medial right base pneumothorax.   Electronically Signed   By: Maisie Fushomas  Register   On: 09/28/2014 07:34   Dg Chest Port 1 View 09/27/2014   CLINICAL DATA:  RIGHT pneumothorax, hypertension, COPD  EXAM: PORTABLE CHEST - 1 VIEW  COMPARISON:  Portable exam 1858 hours compared to 1555 hours  FINDINGS: LEFT jugular central venous catheter with tip projecting over SVC.  Enlargement of cardiac silhouette post CABG.  Mediastinal contours stable with minimal atherosclerotic calcification aorta.  RIGHT basilar effusion and question loculated medial basilar pneumothorax unchanged.  Bibasilar atelectasis.  Tiny LEFT effusion.  No new infiltrate or acute osseous findings.  IMPRESSION: Bibasilar effusions and atelectasis with suspect small loculated medial basilar RIGHT pneumothorax, unchanged.   Electronically Signed   By: Ulyses SouthwardMark  Boles M.D.   On: 09/27/2014 19:24   Dg Chest Port 1 View 09/27/2014   CLINICAL DATA:  Status post right thoracentesis.  EXAM: PORTABLE CHEST - 1 VIEW  COMPARISON:  Single view of the chest earlier this same day.  FINDINGS: The patient's endotracheal tube and NG tube have been removed. Left IJ catheter remains in place.  Right pleural effusion is decreased in size after thoracentesis. There is a new lenticular lucency projecting over the right lower  chest suspicious for a small anterior pneumothorax. Small left pleural effusion is again seen. There is no left pneumothorax. Bibasilar atelectasis is noted.  IMPRESSION: Decreased right pleural effusion after thoracentesis with a likely small anterior pneumothorax identified.  No change in a small left pleural effusion and basilar atelectasis.  Status post extubation and NG tube removal.  Critical Value/emergent results were called by telephone at the time of interpretation on  09/27/2014 at 4:14 pm to Santiago GladAllison Koontz, R.N., who verbally acknowledged these results.   Electronically Signed   By: Drusilla Kannerhomas  Dalessio M.D.   On: 09/27/2014 16:15   Dg Chest Port 1 View 09/27/2014   CLINICAL DATA:  SOB today; f/u right pleural effusion  EXAM: PORTABLE CHEST - 1 VIEW  COMPARISON:  the previous day's study  FINDINGS: Endotracheal tube, nasogastric tube, left IJ central line stable in position. Previous CABG. Stable mild cardiomegaly. Bilateral pleural effusions right greater than left unchanged. Patchy atelectasis or infiltrates in both lung bases persist. Mild central pulmonary vascular congestion.  IMPRESSION: 1. Stable pleural effusions and bilateral infiltrates or edema. 2.  Support hardware stable in position.   Electronically Signed   By: Corlis Leak  Hassell M.D.   On: 09/27/2014 09:03   Dg Chest Port 1 View 09/26/2014   CLINICAL DATA:  Hospital admission for heart failure. Hypertension. COPD.  EXAM: PORTABLE CHEST - 1 VIEW  COMPARISON:  09/24/2014 and 09/22/2014  FINDINGS: Nasogastric tube courses into the stomach and off the inferior portion of the film as tip is not visualized. Endotracheal tube has tip approximately 3.5 cm above the carina. Left IJ central venous catheter is unchanged with tip over the SVC.  Lungs are adequately inflated with persistent moderate size right pleural effusion and small left pleural effusion. Slight worsening airspace opacification over the right mid to lower lung and stable opacification over  the left mid to lower lung. Findings likely represent combination of atelectasis as well as vascular congestion, although cannot exclude infection. Cardiomediastinal silhouette and remainder the exam is unchanged.  IMPRESSION: Slight worsening opacification over the right mid to lower lung with stable opacification of the left mid to lower lung. Findings likely represent combination of atelectasis and vascular congestion although cannot exclude infection. Persistent moderate size right effusion and small left effusion.  Tubes and lines as described.   Electronically Signed   By: Elberta Fortisaniel  Boyle M.D.   On: 09/26/2014 09:57     Current Medications:  . antiseptic oral rinse  7 mL Mouth Rinse QID  . atorvastatin  10 mg Oral q1800  . budesonide (PULMICORT) nebulizer solution  0.5 mg Nebulization BID  . carvedilol  1.5625 mg Per Tube BID WC  . chlorhexidine  15 mL Mouth Rinse BID  . digoxin  0.0625 mg Intravenous Daily  . ipratropium-albuterol  3 mL Nebulization Q6H  . levofloxacin (LEVAQUIN) IV  750 mg Intravenous Q48H  . senna  1 tablet Oral Daily  . sodium chloride  3 mL Intravenous Q12H   . sodium chloride 10 mL/hr at 09/27/14 [id number]    ASSESSMENT AND PLAN: Nathan [name] is a 74 y.o. male with a history of chronic combine S/D CHF, CAD s/p CABG x4 (2013), COPD, CKD, HTN, PVD s/p bilat aorto-iliac stenting (2007), carotid artery disease s/p RCEA (2013), CVA, PAF s/p ablation on coumadin, previous LGIB (2014), recent PE after hip fracture and mod MR who [facility] w/ constipation and SOB and found to have sepsis 2/2 HCAP and colitis. Patient also noted to have severe coagulopathy with INR >10, acute renal failure and hypotension. He had been improving until 5/12 early am with rapid deterioration with obtundation, bradycardia, and hypoxia (50% O2 sat). Intubated till 05/18. 2D ECHO revealed new severe MR and recommended TEE to further investigate. Cardiology consulted for new valvular  disease and further testing.   Severe MR- 2D ECHO 06/2014 with EF 40% with diffuse HK and mod  MR. Limited 2D ECHO this admission with stable LVEF but now severe MR and suggesting TEE to further investigate. Also 2D ECHO suggestive of RV pressure/volume overload, mod TR and mild pulm HTN.  -- Dr Katrinka Blazing, his primary cardiologist believes he has significant MR based upon exam and prior echo studies. MR is likely related to papillary muscle dysfunction in this patient with h/o prior inferior MI.  AF with RVR is not helping the overall situation. BB therapy was been withdrawn due to hypotension (presumed), restarted on BB since BP improved. Unless there is concern about endocarditis(duration of antibiotic therapy), TEE will not add much and may be harmful given co-morbidities and tenuous hemodynamics. He would not be a candidate for repeat open heart surgery.  AF RVR- Will continue dig to control VR to atrial fib. Loaded 5/16>09/26/2014. Dig level 0.8 05/18. Started on .0625 mg daily 05/18. Currently rates are much better controlled. Slow HR period 05/18, no HR sustained < 50, no pauses > 3 sec, follow -- Started on low dose of BB: 1 dose Coreg 1.5625 05/18, should be able to tolerate 3.125 mg bid  NSVT- PVCs but and 1 brief run NSVT overnight. Previously had multiple short runs on tele. Continue BB.   Acute hypoxemic respiratory failure - per CCM, extubated 05/18  Shock - septic vs cardiogenic-->shock resolved. Now off pressors.   Acute on chronic systolic/diastolic CHF - echo 1/61/09: ef 45% - Lasix gtt d/c'd 05/18. Physical exam with evidence of volume overload.  - believe he needs diuresis, will leave volume mgt to CCM   Acute on CKD--> creat improved: peak 3.97 (05/12) 3.37--> 2.9 --> 2.8--> 2.6-->2.42 (05/19)  Coumadin coagulopathy - Hold coumadin. INR 1.51 today - has been sub-therapeutic since 05/14, if restoration of SR planned, will need TEE.  Thrombocytopenia:  Plt up to 75 05/19,  nadir 57  Acute metabolic encephalopathy/Dementia- worsening recently per son. Mental status has improved a lot today.    Melida Quitter , PA-C 7:59 AM 09/28/2014

## 2014-09-28 NOTE — Evaluation (Addendum)
Physical Therapy Evaluation Patient Details Name: Nathan [name] MRN: [id number] DOB: 01/11/1941 Today's Date: 09/28/2014   History of Present Illness  74 year old male w/ PMH: PAF on coumadin, GOLD C COPD, CAD, HTN, systolic/diastolic CHF, CKD III, and PE. He was recently admitted for hip fracture (February 2016, then went to SNF, then home), which was to be managed conservatively. Pt admitted with HCAP, colitis, sepsis.   Clinical Impression  Pt admitted with above diagnosis. Pt currently with functional limitations due to the deficits listed below (see PT Problem List). +2 assist for bed to recliner, pt is deconditioned and fatigues quickly. SNF recommended.  Pt will benefit from skilled PT to increase their independence and safety with mobility to allow discharge to the venue listed below.       Follow Up Recommendations SNF;Supervision/Assistance - 24 hour    Equipment Recommendations  None recommended by PT    Recommendations for Other Services       Precautions / Restrictions Precautions Precautions: Fall Restrictions Weight Bearing Restrictions: No      Mobility  Bed Mobility Overal bed mobility: Needs Assistance Bed Mobility: Rolling;Sidelying to Sit Rolling: Max assist Sidelying to sit: +2 for physical assistance;Max assist       General bed mobility comments: assist to initiate movement, +2 to raise trunk (pt 35%), verbal/manual cues for technique  Transfers Overall transfer level: Needs assistance Equipment used: Rolling walker (2 wheeled) Transfers: Sit to/from Stand Sit to Stand: +2 physical assistance;Max assist;From elevated surface         General transfer comment: assist to power up (pt 40%) from elevated bed, cues for hand placement and to correct flexed posture; sit to stand x 3 trials  Ambulation/Gait Ambulation/Gait assistance: +2 physical assistance;Mod assist Ambulation Distance (Feet): 2 Feet Assistive device: Rolling walker (2  wheeled) Gait Pattern/deviations: Step-to pattern;Decreased step length - right;Decreased step length - left     General Gait Details: pt took 4 pivotal steps from bed to recliner, +2 for safety, cues for technique and to correct flexed posture, distance limited by fatigue  Stairs            Wheelchair Mobility    Modified Rankin (Stroke Patients Only)       Balance Overall balance assessment: Needs assistance Sitting-balance support: Feet supported Sitting balance-Leahy Scale: Fair     Standing balance support: Bilateral upper extremity supported Standing balance-Leahy Scale: Zero Standing balance comment: very heavy use of BUEs in addition to +2 assist to stand, pt's knees buckle after a few seconds of standing                             Pertinent Vitals/Pain Pain Assessment: No/denies pain  Vital signs stable on 2L O2 [place]    Home Living Family/patient expects to be discharged to:: Skilled nursing facility Living Arrangements: Spouse/significant other                    Prior Function Level of Independence: Needs assistance   Gait / Transfers Assistance Needed: walked with RW  ADL's / Homemaking Assistance Needed: assist to bathe, dress        Hand Dominance   Dominant Hand: Right    Extremity/Trunk Assessment   Upper Extremity Assessment: Overall WFL for tasks assessed           Lower Extremity Assessment: Generalized weakness (B knee extension -20* AROM, knee ext strength -4/5)  Cervical / Trunk Assessment: Kyphotic  Communication   Communication: No difficulties  Cognition Arousal/Alertness: Awake/alert Behavior During Therapy: WFL for tasks assessed/performed Overall Cognitive Status: Within Functional Limits for tasks assessed                      General Comments      Exercises        Assessment/Plan    PT Assessment Patient needs continued PT services  PT Diagnosis Difficulty  walking;Generalized weakness   PT Problem List Decreased strength;Decreased range of motion;Decreased activity tolerance;Decreased balance;Decreased knowledge of use of DME;Decreased mobility  PT Treatment Interventions Gait training;Functional mobility training;Therapeutic activities;Patient/family education;Therapeutic exercise   PT Goals (Current goals can be found in the Care Plan section) Acute Rehab PT Goals Patient Stated Goal: to get stronger PT Goal Formulation: With patient/family Time For Goal Achievement: 10/12/14 Potential to Achieve Goals: Fair    Frequency Min 3X/week   Barriers to discharge        Co-evaluation               End of Session Equipment Utilized During Treatment: Gait belt;Oxygen Activity Tolerance: Patient limited by fatigue Patient left: in chair;with call bell/phone within reach;with family/visitor present Nurse Communication: Mobility status         Time: 1110-1135 PT Time Calculation (min) (ACUTE ONLY): 25 min   Charges:     PT Treatments $Therapeutic Activity: 8-22 mins   PT G Codes:        Tamala SerUhlenberg, Tawanda Schall Kistler 09/28/2014, 12:38 PM [phone]

## 2014-09-28 NOTE — Progress Notes (Signed)
MBSS complete. Full report located under chart review in imaging section. Ivyana Locey, MA CCC-SLP [phone]  

## 2014-09-28 NOTE — Progress Notes (Signed)
Date:  May 19,2016 U.R. performed for needs and level of care. Will continue to follow for Case Management needs.  Jarelle Ates, RN, BSN, CCM   [phone] 

## 2014-09-28 NOTE — Consult Note (Signed)
   [facility]   09/28/2014  Allen KellMaurice W Dedominicis 05/29/1940 [id number]   [facility]. Went to bedside to bedside to speak with [facility]. However, he was being transported off the unit for a procedure. Will come back at later time. Will make inpatient RNCM aware that [facility] services. Will continue to follow.  Raiford NobleAtika Hall, MSN-Ed, RN,BSN [facility] Hospital Liaison [phone]

## 2014-09-28 NOTE — Progress Notes (Signed)
PULMONARY / CRITICAL CARE MEDICINE   Name: Nathan [name] MRN: [id number] DOB: 21-May-1940    ADMISSION DATE:  09/16/2014 CONSULTATION DATE:  09/22/2014  REFERRING MD :  Rito Ehrlich  CHIEF COMPLAINT:  Respiratory failure  INITIAL PRESENTATION: 74 year old male w/ PMH:  PAF on coumadin, GOLD C COPD, CAD, HTN, systolic/diastolic CHF, CKD III, and PE. He was recently admitted for hip fracture, which was to be managed conservatively. During that admission he was found to have pulmonary embolism and started on heprain >> coumadin. Admitted 5/9 w/ working dx of colitis, HCAP and sepsis w/ elevated lactate. 5/12 early AM he developed bradycardia, hypoxia with sats around 50, and became obtunded. He was intubated by CRNA. PCCM called for consultation.    STUDIES:  Admission CXR > bilateral effusions R>L. 09/19/2014 5/9 CT abd > Small bilateral pleural effusions greater on the right. Consolidation in the right lower lung suggesting pneumonia. Small amount of abdominal and pelvic free fluid. Increased density suggesting sludge versus milk of calcium in the gallbladder. Mild stranding around the sigmoid colon suggesting early diverticulitis.Left inguinal hernia containing fat and fluid. ECHO  Normal LV size with mild LV hypertrophy. EF 45-50% with mild diffusehypokinesis. D-shaped interventricular septum suggests RV pressure/volume overload. Mildly dilated RV with normal systolic function. Moderate TR. Mild pulmonary hypertension. MR appeared severe.   EVENTS Admitted 5/9: colitis, HCAP, sepsis w/ elevated lactate and coagulopathy 5/11: still hypotensive, wbc rising. Aggressive volume resuscitation provided.  5/12 cdiff negative. Felt sepsis from bacterial translocation from gut. ABX continued. More lethargic  5/13: rapid deterioration. he developed bradycardia, hypoxia with sats around 50, and became obtunded. He was intubated by CRNA. In septic shock, placed on pressors  5/14: still on pressors. Severe MR  on echo. Creatinine improved.  5/15 platelets dropped 68, weaning pressors. More awake, but failed SBT. Tolerating tubefeeds.  5/16: off pressors. Failed SBT. Started lasix gtt. Narrowed abx. Seen by cards. They felt MR was due to MR is likely related to papillary muscle dysfunction in this patient with h/o prior inferior MI. Did not think surgical candidate and did not think TEE would add to rx. They added digoxin.  5/17 added precedex, more awake. Changed CTX to levaquin given rash and itching. Changed fentanyl to low dose dilaudid. Increased lasix as did not achieve negative fluid balance.  5/18 cr a little increased, na increased, lasix gtt stopped.  5/18 rt thora 1000 cc; f/u CXR shows small medial PTX 5/18 extubated  VITAL SIGNS: Temp:  [97.2 F (36.2 C)-98.2 F (36.8 C)] 97.2 F (36.2 C) (05/19 0800) Pulse Rate:  [30-114] 93 (05/19 0800) Resp:  [14-26] 20 (05/19 0800) BP: (89-155)/(40-86) 129/69 mmHg (05/19 0800) SpO2:  [91 %-100 %] 100 % (05/19 0800) FiO2 (%):  [30 %] 30 % (05/18 1100) Weight:  [206 lb 5.6 oz (93.6 kg)] 206 lb 5.6 oz (93.6 kg) (05/19 0500) HEMODYNAMICS:   VENTILATOR SETTINGS: Vent Mode:  [-]  FiO2 (%):  [30 %] 30 % INTAKE / OUTPUT:  Intake/Output Summary (Last 24 hours) at 09/28/14 0945 Last data filed at 09/28/14 0700  Gross per 24 hour  Intake    690 ml  Output   1130 ml  Net   -440 ml    PHYSICAL EXAMINATION: General:  Elderly male, awake, follows commands Neuro:  RASS 0,moves all ext. Oriented X 4 HEENT:  [place]/AT, no JVD noted Cardiovascular:  irregular Lungs:  decreased bs bases Abdomen:  Soft, distended, hyperactive BS Musculoskeletal:  No acute  deformity, peripheral pulses intact Skin:  + 3 pitting edema to BLE, resolving rash  LABS:  PULMONARY  Recent Labs Lab 09/22/14 0225 09/22/14 0445 09/23/14 0355 09/26/14 1023  PHART 7.251* 7.371 7.498* 7.482*  PCO2ART 29.0* 24.6* 24.7* 27.4*  PO2ART 328* 213* 122* 81.6  HCO3 12.3* 13.9*  19.4* 20.3  TCO2 11.7 13.0 18.0 18.7  O2SAT 99.7 99.7 99.1 96.8   CBC  Recent Labs Lab 09/26/14 0440 09/27/14 0410 09/28/14 0440  HGB 9.8* 9.0* 9.7*  HCT 33.2* 30.9* 33.9*  WBC 6.2 4.4 5.7  PLT 57* 59* 75*   COAGULATION  Recent Labs Lab 09/24/14 0500 09/25/14 0510 09/26/14 0440 09/27/14 0410 09/28/14 0440  INR 1.84* 1.77* 1.65* 1.66* 1.51*   CARDIAC    Recent Labs Lab 09/22/14 0206 09/22/14 1130 09/22/14 1826  TROPONINI 0.08* 0.08* 0.08*   No results for input(s): PROBNP in the last 168 hours.   CHEMISTRY  Recent Labs Lab 09/23/14 0405 09/24/14 0500 09/25/14 0510  09/25/14 1800 09/26/14 0440 09/26/14 1030 09/26/14 1810 09/26/14 2211 09/27/14 0410 09/27/14 0930 09/28/14 0440  NA 142 142 143  < > 144 145 147* 148*  --   --  149* 147*  K 3.0* 3.1* 2.9*  < > 5.4* 4.5 4.0 3.8  --   --  3.4* 4.1  CL 111 108 111  < > 113* 112* 114* 115*  --   --  113* 113*  CO2 22 21* 22  < > 21* --   --  25 25  GLUCOSE 119* 112* 112*  < > 115* 118* 108* 111*  --   --  114* 81  BUN 84* 77* 76*  < > 79* 79* 76* 77*  --   --  75* 77*  CREATININE 3.47* 3.37* 2.90*  < > 2.92* 2.80* 2.60* 2.63*  --   --  2.50* 2.42*  CALCIUM 7.4* 7.4* 7.6*  < > 7.9* 8.0* 7.9* 7.9*  --   --  8.1* 8.5*  MG 1.9 1.9 2.1  < > 2.2 2.2  2.2 2.1  --  2.0 2.0  --   --   PHOS 3.9 3.6 3.2  --   --  3.3  --   --   --  3.0  --   --   < > = values in this interval not displayed. Estimated Creatinine Clearance: 29.8 mL/min (by C-G formula based on Cr of 2.42).  LIVER  Recent Labs Lab 09/22/14 0206 09/22/14 0530 09/23/14 0405 09/24/14 0500 09/25/14 0510 09/26/14 0440 09/27/14 0410 09/27/14 1645 09/28/14 0440  AST 490* 385* 226*  --   --  51*  --   --   --   ALT 607* 496* 375*  --   --  149*  --   --   --   ALKPHOS 110 97 79  --   --  103  --   --   --   BILITOT 3.0* 3.0* 3.0*  --   --  2.1*  --   --   --   PROT 6.2* 5.3* 4.6*  --   --  5.6*  --  6.3*  --   ALBUMIN 2.4* 2.1* 1.8*   --   --  1.9*  --   --   --   INR 4.41* 4.36* 2.66* 1.84* 1.77* 1.65* 1.66*  --  1.51*     INFECTIOUS  Recent Labs Lab 09/22/14 0206 09/22/14 0215 09/22/14 0530 09/23/14 0405  09/24/14 0500  LATICACIDVEN  --  5.6* 4.3* 1.7  --   PROCALCITON 0.69  --   --  0.69 0.52     ENDOCRINE CBG (last 3)   Recent Labs  09/27/14 2334 09/28/14 0337 09/28/14 0755  GLUCAP 80 70 70    IMAGING x48h  Dg Chest Port 1 View  09/28/2014   CLINICAL DATA:  Pleural effusion.  EXAM: PORTABLE CHEST - 1 VIEW  COMPARISON:  09/27/2014.  FINDINGS: Left IJ line noted in good anatomic position. Mediastinum hilar structures normal. Cardiomegaly with normal pulmonary vascularity. Prior CABG. Interim slight improvement of bibasilar atelectasis and/or infiltrates. Interim slight improvement of bilateral pleural effusions. Persistent but improved questionable medial right base pneumothorax.  IMPRESSION: 1. Left IJ line in stable position. 2. Prior CABG.Stable cardiomegaly.  Pulmonary vascularity normal . 3. Slight improvement of bibasilar atelectasis and/or infiltrates. Slight improvement of bilateral pleural effusions. 4. Slight improvement of questionable medial right base pneumothorax.   Electronically Signed   By: Maisie Fus  Register   On: 09/28/2014 07:34   Dg Chest Port 1 View  09/27/2014   CLINICAL DATA:  RIGHT pneumothorax, hypertension, COPD  EXAM: PORTABLE CHEST - 1 VIEW  COMPARISON:  Portable exam 1858 hours compared to 1555 hours  FINDINGS: LEFT jugular central venous catheter with tip projecting over SVC.  Enlargement of cardiac silhouette post CABG.  Mediastinal contours stable with minimal atherosclerotic calcification aorta.  RIGHT basilar effusion and question loculated medial basilar pneumothorax unchanged.  Bibasilar atelectasis.  Tiny LEFT effusion.  No new infiltrate or acute osseous findings.  IMPRESSION: Bibasilar effusions and atelectasis with suspect small loculated medial basilar RIGHT pneumothorax,  unchanged.   Electronically Signed   By: Ulyses Southward M.D.   On: 09/27/2014 19:24   Dg Chest Port 1 View  09/27/2014   CLINICAL DATA:  Status post right thoracentesis.  EXAM: PORTABLE CHEST - 1 VIEW  COMPARISON:  Single view of the chest earlier this same day.  FINDINGS: The patient's endotracheal tube and NG tube have been removed. Left IJ catheter remains in place.  Right pleural effusion is decreased in size after thoracentesis. There is a new lenticular lucency projecting over the right lower chest suspicious for a small anterior pneumothorax. Small left pleural effusion is again seen. There is no left pneumothorax. Bibasilar atelectasis is noted.  IMPRESSION: Decreased right pleural effusion after thoracentesis with a likely small anterior pneumothorax identified.  No change in a small left pleural effusion and basilar atelectasis.  Status post extubation and NG tube removal.  Critical Value/emergent results were called by telephone at the time of interpretation on 09/27/2014 at 4:14 pm to Santiago Glad, R.N., who verbally acknowledged these results.   Electronically Signed   By: Drusilla Kanner M.D.   On: 09/27/2014 16:15   Dg Chest Port 1 View  09/27/2014   CLINICAL DATA:  SOB today; f/u right pleural effusion  EXAM: PORTABLE CHEST - 1 VIEW  COMPARISON:  the previous day's study  FINDINGS: Endotracheal tube, nasogastric tube, left IJ central line stable in position. Previous CABG. Stable mild cardiomegaly. Bilateral pleural effusions right greater than left unchanged. Patchy atelectasis or infiltrates in both lung bases persist. Mild central pulmonary vascular congestion.  IMPRESSION: 1. Stable pleural effusions and bilateral infiltrates or edema. 2.  Support hardware stable in position.   Electronically Signed   By: Corlis Leak M.D.   On: 09/27/2014 09:03   Dg Chest Port 1 View  09/26/2014   CLINICAL DATA:  Hospital admission for heart failure. Hypertension. COPD.  EXAM: PORTABLE CHEST - 1 VIEW   COMPARISON:  09/24/2014 and 09/22/2014  FINDINGS: Nasogastric tube courses into the stomach and off the inferior portion of the film as tip is not visualized. Endotracheal tube has tip approximately 3.5 cm above the carina. Left IJ central venous catheter is unchanged with tip over the SVC.  Lungs are adequately inflated with persistent moderate size right pleural effusion and small left pleural effusion. Slight worsening airspace opacification over the right mid to lower lung and stable opacification over the left mid to lower lung. Findings likely represent combination of atelectasis as well as vascular congestion, although cannot exclude infection. Cardiomediastinal silhouette and remainder the exam is unchanged.  IMPRESSION: Slight worsening opacification over the right mid to lower lung with stable opacification of the left mid to lower lung. Findings likely represent combination of atelectasis and vascular congestion although cannot exclude infection. Persistent moderate size right effusion and small left effusion.  Tubes and lines as described.   Electronically Signed   By: Elberta Fortisaniel  Boyle M.D.   On: 09/26/2014 09:57   I have personally reviewed the films.   ASSESSMENT / PLAN:  PULMONARY OETT 5/13 >>>5/18 A: Acute hypoxemic respiratory failure -ventilator dependent COPD without acute exacerbation Bilateral effusions R>L effusion  Suspected HCAP Possible medial rt ptx, suspect that this is ex vacuo following R thoracentesis on 5/18 (transudate)  P:   Extubated 5/18 Follow CXR and R PTX Cont pulm hygiene Keep even volume status for now  Follow pleural fluid culture results  CARDIOVASCULAR CVL 5/13 >>> A:  Shock - septic vs cardiogenic-->shock resolved  AF RVR Acute on chronic systolic/diastolic CHF - echo 1/61/095/13/16: ef 45% Severe MR: MR is likely related to papillary muscle dysfunction in this patient with h/o prior inferior MI. >looks like even vol status currently  P:  DC lasix   Serial chemistries w/ mg Cont tele  Pharmacy following INR will need to eventually go back on coumadin (5/19 plts 75) Dig added; continue  Cards does not think TEE good choice given not surgical candidate.   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 Some slow improvement Hypernatremia s/p lasix (improving) Hyperkalemia  (resolved)  P:   Strict I&O Dc lasix Cont free water Replace K  GASTROINTESTINAL A:   Transaminitis, ? Shock liver-->improved  Colitis H/o recent ileus LFT better  P:   Follow LFT intermittently  Adv diet  D/c PPI   HEMATOLOGIC A:   Coumadin coagulopathy Anemia, appears chronic  - INR improved but plat dropped but prob due to sepsis P:  Follow CBC Hold coumadin given low platelets 59->75 on 5/19 Follow Coags and platelet PAS  INFECTIOUS Body fluid 5/18>> BCx2 5/9 >>>neg  UC 5/10 neg Cdif 5/11 > neg Sputum 5/13 >>>candida  A:   Severe sepsis d/t colitis, and presumed bacterial translocation  prob HCAP P:   Abx: Zosyn, start date 5/10 >>>5/16 Abx: Vancomycin, start date 5/10 >>>5/16 abx rocephin 5/16>>>5/17 (stopped d/t itching) levaquin 5/17>>>  ENDOCRINE CBG (last  3)   Recent Labs  09/27/14 2334 09/28/14 0337 09/28/14 0755  GLUCAP 80 70 70   A:   No acute issues P:   Follow glucose on Bmet Add SSI if consistently greater than 180.  NEUROLOGIC A:   Acute metabolic encephalopathy-->resolved   P:   Dc sedating meds   Derm A: rash/urticaria  P: Changed abx to levaquin Changed fentanyl to low dose dilaudid  PRN  benadryl   FAMILY    Brett CanalesSteve Minor ACNP [facility] Pager [phone] till 3 pm If no answer page [phone] 09/28/2014, 9:46 AM   Attending Note:  I have examined patient, reviewed labs, studies and notes. I have discussed the case with S Minor, and I agree with the data and plans as amended above. Stabilizing post-extubation. I suspect the medial R PTX is ex vacuo post R thora, will follow this and defer chest tube for now. Dose lasix daily, possibly restart 5/20.   Levy Pupaobert Melisia Leming, MD, PhD 09/28/2014, 10:35 AM [facility] [phone] or if no answer [phone]

## 2014-09-28 NOTE — Clinical Social Work Note (Signed)
Clinical Social Work Assessment  Patient Details  Name: Nathan [name] MRN: [id number] Date of Birth: 11/28/1940  Date of referral:  09/28/14               Reason for consult:  Facility Placement, Discharge Planning                Permission sought to share information with:    Permission granted to share information::     Name::        Agency::     Relationship::     Contact Information:     Housing/Transportation Living arrangements for the past 2 months:  Single Family Home Source of Information:  Spouse Patient Interpreter Needed:  None Criminal Activity/Legal Involvement Pertinent to Current Situation/Hospitalization:    Significant Relationships:  Adult Children, Spouse Lives with:    Do you feel safe going back to the place where you live?   (SNF placement required.) Need for family participation in patient care:  Yes (Comment)  Care giving concerns:  Pt's care cannot be managed at home.   Social Worker assessment / plan: Pt hospitalized on 09/16/2014 with HCAP. Pt had recently been d/c from [facility] where he was receiving [facility] Rehab. CSW met with pt/ family to assist with d/c planning. PT has recommended SNF placement. Pt /family are interested in returning to [facility] following hospital d/c. CSW will contact SNF to check bed availability and see if  SNF is able to readmit pt at d/c. Employment status:  Retired Insurance information:  Managed Medicare PT Recommendations:  Skilled Nursing Facility Information / Referral to community resources:   (None needed at this time.)  Patient/Family's Response to care:  Pt / SNF agree rehab is needed following hospital d/c.  Patient/Family's Understanding of and Emotional Response to Diagnosis, Current Treatment, and Prognosis:  Pt / family are hopeful pt will continue to progress.   Emotional Assessment Appearance:  Appears stated age Attitude/Demeanor/Rapport:   (cooperative) Affect (typically observed):  Calm,  Pleasant Orientation:  Oriented to Self, Oriented to Place, Oriented to  Time Alcohol / Substance use:  Not Applicable Psych involvement (Current and /or in the community):  No (Comment)  Discharge Needs  Concerns to be addressed:  Discharge Planning Concerns Readmission within the last 30 days:  No Current discharge risk:  None Barriers to Discharge:  No Barriers Identified   ,  Lee, LCSW 09/28/2014, 4:06 PM  

## 2014-09-29 ENCOUNTER — Inpatient Hospital Stay (HOSPITAL_COMMUNITY): Payer: Medicare PPO

## 2014-09-29 LAB — CBC
HEMATOCRIT: 31 % — AB (ref 39.0–52.0)
Hemoglobin: 8.9 g/dL — ABNORMAL LOW (ref 13.0–17.0)
MCH: 25 pg — ABNORMAL LOW (ref 26.0–34.0)
MCHC: 28.7 g/dL — ABNORMAL LOW (ref 30.0–36.0)
MCV: 87.1 fL (ref 78.0–100.0)
Platelets: 76 10*3/uL — ABNORMAL LOW (ref 150–400)
RBC: 3.56 MIL/uL — AB (ref 4.22–5.81)
RDW: 21.8 % — ABNORMAL HIGH (ref 11.5–15.5)
WBC: 4.3 10*3/uL (ref 4.0–10.5)

## 2014-09-29 LAB — BASIC METABOLIC PANEL
ANION GAP: 11 (ref 5–15)
BUN: 77 mg/dL — ABNORMAL HIGH (ref 6–20)
CHLORIDE: 117 mmol/L — AB (ref 101–111)
CO2: 26 mmol/L (ref 22–32)
Calcium: 8.7 mg/dL — ABNORMAL LOW (ref 8.9–10.3)
Creatinine, Ser: 2.15 mg/dL — ABNORMAL HIGH (ref 0.61–1.24)
GFR calc Af Amer: 33 mL/min — ABNORMAL LOW (ref >60)
GFR, EST NON AFRICAN AMERICAN: 28 mL/min — AB (ref >60)
GLUCOSE: 101 mg/dL — AB (ref 65–99)
Potassium: 3.5 mmol/L (ref 3.5–5.1)
Sodium: 154 mmol/L — ABNORMAL HIGH (ref 135–145)

## 2014-09-29 LAB — GLUCOSE, CAPILLARY
GLUCOSE-CAPILLARY: 63 mg/dL — AB (ref 65–99)
GLUCOSE-CAPILLARY: 98 mg/dL (ref 65–99)
GLUCOSE-CAPILLARY: 71 mg/dL (ref 65–99)
GLUCOSE-CAPILLARY: 71 mg/dL (ref 65–99)
GLUCOSE-CAPILLARY: 67 mg/dL (ref 65–99)
GLUCOSE-CAPILLARY: 98 mg/dL (ref 65–99)
Glucose-Capillary: 61 mg/dL — ABNORMAL LOW (ref 65–99)
Glucose-Capillary: 76 mg/dL (ref 65–99)

## 2014-09-29 LAB — APTT: aPTT: 39 seconds — ABNORMAL HIGH (ref 24–37)

## 2014-09-29 LAB — PROTIME-INR
INR: 1.48 (ref 0.00–1.49)
Prothrombin Time: 18 seconds — ABNORMAL HIGH (ref 11.6–15.2)

## 2014-09-29 LAB — PHOSPHORUS: Phosphorus: 4.1 mg/dL (ref 2.5–4.6)

## 2014-09-29 MED ORDER — DEXTROSE-NACL 5-0.45 % IV SOLN
INTRAVENOUS | Status: DC
  Administered 2014-09-29: 04:00:00 via INTRAVENOUS

## 2014-09-29 MED ORDER — VITAL HIGH PROTEIN PO LIQD
1000.0000 mL | ORAL | Status: DC
  Filled 2014-09-29: qty 1000

## 2014-09-29 MED ORDER — DEXTROSE 50 % IV SOLN
INTRAVENOUS | Status: AC
  Administered 2014-09-29: 25 mL via INTRAVENOUS
  Filled 2014-09-29: qty 50

## 2014-09-29 MED ORDER — POTASSIUM CHLORIDE 20 MEQ/15ML (10%) PO SOLN
40.0000 meq | Freq: Three times a day (TID) | ORAL | Status: DC
  Administered 2014-09-29 – 2014-09-30 (×2): 40 meq
  Filled 2014-09-29 (×3): qty 30

## 2014-09-29 MED ORDER — AMIODARONE HCL 200 MG PO TABS
200.0000 mg | ORAL_TABLET | Freq: Two times a day (BID) | ORAL | Status: DC
  Administered 2014-09-29 – 2014-10-02 (×7): 200 mg via ORAL
  Filled 2014-09-29 (×8): qty 1

## 2014-09-29 MED ORDER — DEXTROSE 50 % IV SOLN
25.0000 mL | Freq: Once | INTRAVENOUS | Status: AC
  Administered 2014-09-29 (×2): 25 mL via INTRAVENOUS
  Filled 2014-09-29: qty 50

## 2014-09-29 MED ORDER — MAGNESIUM OXIDE 400 (241.3 MG) MG PO TABS
400.0000 mg | ORAL_TABLET | Freq: Every day | ORAL | Status: DC
  Administered 2014-09-29 – 2014-10-02 (×4): 400 mg
  Filled 2014-09-29 (×5): qty 1

## 2014-09-29 MED ORDER — VITAL AF 1.2 CAL PO LIQD
1000.0000 mL | ORAL | Status: DC
  Administered 2014-09-29 – 2014-10-02 (×5): 1000 mL
  Filled 2014-09-29 (×8): qty 1000

## 2014-09-29 MED ORDER — SODIUM BICARBONATE 650 MG PO TABS
650.0000 mg | ORAL_TABLET | Freq: Once | ORAL | Status: DC
  Filled 2014-09-29: qty 1

## 2014-09-29 MED ORDER — PANCRELIPASE (LIP-PROT-AMYL) 12000-38000 UNITS PO CPEP
2.0000 | ORAL_CAPSULE | Freq: Once | ORAL | Status: DC
  Filled 2014-09-29: qty 2

## 2014-09-29 MED ORDER — HEPARIN (PORCINE) IN NACL 100-0.45 UNIT/ML-% IJ SOLN
1250.0000 [IU]/h | INTRAMUSCULAR | Status: DC
  Administered 2014-09-29: 1250 [IU]/h via INTRAVENOUS
  Filled 2014-09-29 (×2): qty 250

## 2014-09-29 MED ORDER — DEXTROSE 50 % IV SOLN
25.0000 mL | Freq: Once | INTRAVENOUS | Status: AC
  Administered 2014-09-29: 25 mL via INTRAVENOUS

## 2014-09-29 MED ORDER — FUROSEMIDE 10 MG/ML IJ SOLN
40.0000 mg | Freq: Three times a day (TID) | INTRAMUSCULAR | Status: AC
  Administered 2014-09-29 – 2014-09-30 (×3): 40 mg via INTRAVENOUS
  Filled 2014-09-29 (×3): qty 4

## 2014-09-29 MED ORDER — SODIUM CHLORIDE 0.9 % IJ SOLN
10.0000 mL | INTRAMUSCULAR | Status: DC | PRN
  Administered 2014-09-30 – 2014-10-01 (×2): 20 mL
  Administered 2014-10-02 (×2): 10 mL
  Administered 2014-10-02 – 2014-10-03 (×2): 20 mL
  Filled 2014-09-29 (×6): qty 40

## 2014-09-29 MED ORDER — FREE WATER
240.0000 mL | Status: DC
  Administered 2014-09-29 – 2014-10-01 (×10): 240 mL

## 2014-09-29 NOTE — Progress Notes (Signed)
Physician Discharge Summary       Patient ID: Nathan [name] MRN: [id number] DOB/AGE: 06/30/1940 74 y.o.  Admit date: 10/06/2014 Discharge date: 09/29/2014  Discharge Diagnoses:   Acute hypoxemic respiratory failure ventilator dependent COPD without acute exacerbation Bilateral effusions R>L effusion  Suspected HCAP Possible medial rt ptx, suspect that this is ex vacuo following R thoracentesis on 5/18 (transudate) Shock - septic vs cardiogenic-->shock resolved  AF RVR Acute on chronic systolic/diastolic CHF - echo 5/63/87: ef 45% Severe MR Acute on CKD Hypernatremia s/p lasix (worse as of 5/20) Hyperkalemia (resolved)  Transaminitis, ? Shock liver-->improved  Colitis H/o recent ileus Dysphagia  Coumadin coagulopathy Anemia, appears chronic Severe sepsis d/t colitis, and presumed bacterial translocation  prob HCAP Acute metabolic encephalopathy-->resolved  Physical deconditioning  Aggressive PT/OT rash/urticaria  Detailed Hospital Course:  74 year old male w/ PMH: PAF on coumadin, GOLD C COPD, CAD, HTN, systolic/diastolic CHF, CKD III, and PE. He was recently admitted for hip fracture, which was to be managed conservatively. During that admission he was found to have pulmonary embolism and started on heprain >> coumadin. Admitted 5/9 w/ working dx of colitis, HCAP and sepsis w/ elevated lactate. 5/12 early AM he developed bradycardia, hypoxia with sats around 50, and became obtunded. He was intubated by CRNA. PCCM called for consultation. 5/13: rapid deterioration. he developed bradycardia, hypoxia with sats around 50, and became obtunded. He was intubated by CRNA. In septic shock, placed on pressors . 5/14: still on pressors. Severe MR on echo. Creatinine improved. 5/15 platelets dropped 68, weaning pressors. More awake, but failed SBT. Tolerating tubefeeds. 5/16: off pressors. Failed SBT. Started lasix gtt. Narrowed abx. Seen by cards. They felt MR was due to MR  is likely related to papillary muscle dysfunction in this patient with h/o prior inferior MI. Did not think surgical candidate and did not think TEE would add to rx. They added digoxin. 5/17 added precedex, more awake. Changed CTX to levaquin given rash and itching. Changed fentanyl to low dose dilaudid. Increased lasix as did not achieve negative fluid balance. 5/18 cr a little increased, na increased, lasix gtt stopped. 5/18 rt thora 1000 cc; f/u CXR shows small medial PTX (ex vacuo)-->transudate 5/18 extubated. 5/19 failed swallow eval. 5/20 effusion returned. Added back lasix, placed feeding tube, replaced free water. SLP cont to assist. ABX stopped. Ready to transfer back to medical service. His plan at this point is listed below per active issues    Discharge Plan by active problems  COPD w/out acute exacerbation Plan Cont scheduled BDs pulm hygiene   Bilateral effusions. Now s/p right thoracentesis w/ fluid analysis c/w transudate Plan Push diuresis Maximize nutritional support  Acute on chronic systolic/diastolic CHF - echo 5/64/33: ef 45% Severe MR: MR is likely related to papillary muscle dysfunction in this patient with h/o prior inferior MI. >looks like even vol status currently  Plan:  Resume lasix, but replace free water Serial chemistries w/ mg Cont tele  Resume heparin gtt Dig added; continue  Cards does not think TEE good choice given not surgical candidate.   Acute on chronic renal disease.  Plan F/u chemistry in light of escalated diuretics.   Hypernatremia  Plan Free water added via NGT  Right ex-vacuo pneumothorax. Stable.  Plan Monitor.  No need for thoracentesis Will resolve spontaneously  H/o PE: had held Coumadin for thrombocytopenia Plan Resume heparin gtt. If platelets stay stable add coumadin soon   H/o afib Plan Cont rate control Anticoagulation as above  .  Dysphagia  Plan:  Place feeding tube, tubefeeds w/ SLP follow to  re-assess  Anemia, appears chronic Plan:  Follow CBC Hold coumadin given low platelets, resume heparin/coumadin bridge once PLTs > 100 Follow Coags and platelet PAS  Thrombocytopenia: plts now stable Plan Resume heparin w/out bolus If plts cont to improve and no further evidence of bleeding add back coumadin   Physical deconditioning  Plan:  Dc sedating meds  Aggressive PT/OT  Significant Hospital tests/ studies  Consults cardiology   Discharge Exam: BP 166/88 mmHg  Pulse 107  Temp(Src) 97.5 F (36.4 C) (Oral)  Resp 29  Ht 6' 1" (1.854 m)  Wt 91.5 kg (201 lb 11.5 oz)  BMI 26.62 kg/m2  SpO2 93%  General: Elderly male, awake, follows commands; no distress.  Neuro: RASS 0,moves all ext. Oriented X 4; sp a little more clear  HEENT: [place]/AT, no JVD noted Cardiovascular: irregular Lungs: decreased bs bases; no accessory muscle use. Crackle in bases  Abdomen: Soft, distended, hyperactive BS Musculoskeletal: No acute deformity, peripheral pulses intact Skin: + 3 pitting edema to BLE, resolving rash  Labs at discharge Lab Results  Component Value Date   CREATININE 2.15* 09/29/2014   BUN 77* 09/29/2014   NA 154* 09/29/2014   K 3.5 09/29/2014   CL 117* 09/29/2014   CO2 26 09/29/2014   Lab Results  Component Value Date   WBC 4.3 09/29/2014   HGB 8.9* 09/29/2014   HCT 31.0* 09/29/2014   MCV 87.1 09/29/2014   PLT 76* 09/29/2014   Lab Results  Component Value Date   ALT 149* 09/26/2014   AST 51* 09/26/2014   ALKPHOS 103 09/26/2014   BILITOT 2.1* 09/26/2014   Lab Results  Component Value Date   INR 1.48 09/29/2014   INR 1.51* 09/28/2014   INR 1.66* 09/27/2014    Current radiology studies Dg Abd 1 View  09/29/2014   CLINICAL DATA:  Feeding tube placement  EXAM: ABDOMEN - 1 VIEW  COMPARISON:  Portable exam 1038 hours compared to 09/15/2014  FINDINGS: Tip of feeding tube at gastric fundus.  Nonobstructive bowel gas pattern.  Small amount of  contrast retained in distal ileum.  Air-filled colon, upper normal caliber.  RIGHT pleural effusion and LEFT lower lobe infiltrate identified.  IMPRESSION: Tip feeding tube coiled in proximal stomach with tip at fundus.   Electronically Signed   By: Lavonia Dana M.D.   On: 09/29/2014 11:03   Dg Chest Port 1 View  09/29/2014   CLINICAL DATA:  Respiratory failure, effusions.  EXAM: PORTABLE CHEST - 1 VIEW  COMPARISON:  Yesterday at 0543 hour  FINDINGS: Tip of the left central line remains in the SVC. Cardiomegaly is unchanged. Increased right pleural effusion with increasing fluid in the fissure. Lucency adjacent to the right heart border may be related to aerated lung adjacent to pleural effusions versus pneumothorax, this has minimally progressed from prior. Left pleural effusion is unchanged. Bibasilar opacities are likely related to compressive atelectasis. Pulmonary vasculature is normal. There is increasing bilateral perihilar atelectasis.  IMPRESSION: 1. Increasing right pleural effusion with increasing fluid in the fissure. Lucency about the right heart border, may reflect aerated lung adjacent to pleural fluid versus small pneumothorax. 2. Unchanged left pleural effusion. 3. Increasing bilateral perihilar atelectasis.   Electronically Signed   By: Jeb Levering M.D.   On: 09/29/2014 05:20   Dg Chest Port 1 View  09/28/2014   CLINICAL DATA:  Pleural effusion.  EXAM: PORTABLE CHEST - 1  VIEW  COMPARISON:  09/27/2014.  FINDINGS: Left IJ line noted in good anatomic position. Mediastinum hilar structures normal. Cardiomegaly with normal pulmonary vascularity. Prior CABG. Interim slight improvement of bibasilar atelectasis and/or infiltrates. Interim slight improvement of bilateral pleural effusions. Persistent but improved questionable medial right base pneumothorax.  IMPRESSION: 1. Left IJ line in stable position. 2. Prior CABG.Stable cardiomegaly.  Pulmonary vascularity normal . 3. Slight improvement of  bibasilar atelectasis and/or infiltrates. Slight improvement of bilateral pleural effusions. 4. Slight improvement of questionable medial right base pneumothorax.   Electronically Signed   By: Thomas  Register   On: 09/28/2014 07:34   Dg Chest Port 1 View  09/27/2014   CLINICAL DATA:  RIGHT pneumothorax, hypertension, COPD  EXAM: PORTABLE CHEST - 1 VIEW  COMPARISON:  Portable exam 1858 hours compared to 1555 hours  FINDINGS: LEFT jugular central venous catheter with tip projecting over SVC.  Enlargement of cardiac silhouette post CABG.  Mediastinal contours stable with minimal atherosclerotic calcification aorta.  RIGHT basilar effusion and question loculated medial basilar pneumothorax unchanged.  Bibasilar atelectasis.  Tiny LEFT effusion.  No new infiltrate or acute osseous findings.  IMPRESSION: Bibasilar effusions and atelectasis with suspect small loculated medial basilar RIGHT pneumothorax, unchanged.   Electronically Signed   By: Mark  Boles M.D.   On: 09/27/2014 19:24   Dg Chest Port 1 View  09/27/2014   CLINICAL DATA:  Status post right thoracentesis.  EXAM: PORTABLE CHEST - 1 VIEW  COMPARISON:  Single view of the chest earlier this same day.  FINDINGS: The patient's endotracheal tube and NG tube have been removed. Left IJ catheter remains in place.  Right pleural effusion is decreased in size after thoracentesis. There is a new lenticular lucency projecting over the right lower chest suspicious for a small anterior pneumothorax. Small left pleural effusion is again seen. There is no left pneumothorax. Bibasilar atelectasis is noted.  IMPRESSION: Decreased right pleural effusion after thoracentesis with a likely small anterior pneumothorax identified.  No change in a small left pleural effusion and basilar atelectasis.  Status post extubation and NG tube removal.  Critical Value/emergent results were called by telephone at the time of interpretation on 09/27/2014 at 4:14 pm to Allison Koontz, R.N.,  who verbally acknowledged these results.   Electronically Signed   By: Thomas  Dalessio M.D.   On: 09/27/2014 16:15   Dg Swallowing Func-speech Pathology  09/28/2014    Objective Swallowing Evaluation:    Patient Details  Name: Tyheim W Tegtmeyer MRN: [id number] Date of Birth: 02/24/1941  Today's Date: 09/28/2014 Time: SLP Start Time (ACUTE ONLY): 1400-SLP Stop Time (ACUTE ONLY): 1425 SLP Time Calculation (min) (ACUTE ONLY): 25 min  Past Medical History:  Past Medical History  Diagnosis Date  . Aorto-iliac disease     a. s/p bilat with stent 2007 - followed by [facility].  . CAD (coronary artery disease)     a. 10/2011 s/p CABG x4 (LIMA-LAD, SVG-Diag, SVG-OM1 and SVG-PDA)  b.  03/2012 NSTEMI s/p LHC SVG-RCA and LIMA-LAD but total occlusion of SVG-OM  as well as SVG-diag. RX therapy recommended   . Hypertension   . ED (erectile dysfunction)   . COPD, severe   . Hyperlipidemia   . Stroke   . GERD (gastroesophageal reflux disease)   . Gout   . Paroxysmal atrial flutter     ablation  . Chronic combined systolic and diastolic CHF (congestive heart failure)     a. Last echo 04/2014: EF 40-45%.  . PAF (  paroxysmal atrial fibrillation)   . CKD (chronic kidney disease), stage III   . Tobacco abuse   . History of noncompliance with medical treatment   . Carotid artery disease     a. RCEA 2013 - followed by [facility].  . Anemia   . Lower GI bleed     a. LGIB 10/2012: Colonoscopy performed June 21 showed blood in the colon  but no definite source identified, diverticular versus AVM suspected.  Aspirin stopped.  . Mitral regurgitation   . Hypertensive heart disease   . PE (pulmonary embolism)    Past Surgical History:  Past Surgical History  Procedure Laterality Date  . Spinal fusion    . Hand surgery    . Neck fusion  1975  . Eye surgery    . Coronary artery bypass graft  10/31/2011    Procedure: CORONARY ARTERY BYPASS GRAFTING (CABG);  Surgeon: Grace Isaac, MD;  Location: [facility];  Service: Open Heart Surgery;  Laterality:  N/A;  times  three using  Left Internal Mammary Artery and Right Greater  Saphenous Vein Graft harvested endoscopically; TEE  . Endarterectomy  10/31/2011    Procedure: ENDARTERECTOMY CAROTID;  Surgeon: Serafina Mitchell, MD;   Location: [facility] OR;  Service: Vascular;  Laterality: Right;  with patch  angioplasty   . Carotid endarterectomy  10-31-2011    right  . Colonoscopy N/A 10/30/2012    Procedure: COLONOSCOPY;  Surgeon: Lear Ng, MD;  Location: [facility]  ENDOSCOPY;  Service: Endoscopy;  Laterality: N/A;  . Left heart catheterization with coronary angiogram N/A 09/04/2011    Procedure: LEFT HEART CATHETERIZATION WITH CORONARY ANGIOGRAM;  Surgeon:  Sinclair Grooms, MD;  Location: [facility] CATH LAB;  Service: Cardiovascular;   Laterality: N/A;  . Left heart cath N/A 04/03/2012    Procedure: LEFT HEART CATH;  Surgeon: Troy Sine, MD;  Location: [facility]  CATH LAB;  Service: Cardiovascular;  Laterality: N/A;  . Pelvic fracture surgery     HPI:  Other Pertinent Information: 74 year old male admitted 10/04/2014 due to  colitis, HCAP, sepsis. Pt deteriorated and was intubated 09/22/14.  Extubated 09/27/14.  PMH significant for CVA, GERD, COPD. Orders received  for swallow evaluation.  No Data Recorded  Assessment / Plan / Recommendation CHL IP CLINICAL IMPRESSIONS 09/28/2014  Therapy Diagnosis (None)  Clinical Impression Pt demonstrates a severe oropharyngeal dysphagia with  silent penetration and aspiration of all consistencies tested. There is  reduced laryngeal closure during the swallow with all boluses reaching the  vocal folds. Hyolaryngeal excursion is particularly limited resulting in  poor opening of UES. Penetrate and residuals present post swallow are  silently penetrated/aspirated despite cues for multiple swallows and  throat clears. A chin tuck did slightly improve UES opening, but not  enough for any texture to be safely consumed.  Suspect pt likely had a  structural dysphagia prior to admission given curvature of spine and   suspected impact on UES function.  Recommend pt remain NPO with laryngeal  closure exercises and ongoing PO trials. Consider short term method of  nutrition.       CHL IP TREATMENT RECOMMENDATION 09/28/2014  Treatment Recommendations Therapy as outlined in treatment plan below     CHL IP DIET RECOMMENDATION 09/28/2014  SLP Diet Recommendations Alternative means - temporary  Liquid Administration via (None)  Medication Administration Via alternative means  Compensations (None)  Postural Changes and/or Swallow Maneuvers (None)     CHL IP OTHER RECOMMENDATIONS  09/28/2014  Recommended Consults (None)  Oral Care Recommendations Oral care QID  Other Recommendations (None)     CHL IP FOLLOW UP RECOMMENDATIONS 06/15/2014  Follow up Recommendations None     CHL IP FREQUENCY AND DURATION 09/28/2014  Speech Therapy Frequency (ACUTE ONLY) min 3x week  Treatment Duration 2 weeks     Pertinent Vitals/Pain NA    SLP Swallow Goals CHL IP SWALLOW STUDY GOALS 11/14/2011  Patient will consume recommended diet without observed clinical signs of  aspiration with Supervision/safety  Swallow Study Goal #1 - Progress Not Met  Patient will utilize recommended strategies during swallow to increase  swallowing safety with Supervision/safety  Swallow Study Goal #2 - Progress Not met  Goal #3 (None)  Swallow Study Goal #3 - Progress (None)  Goal #4 (None)  Swallow Study Goal #4 - Progress (None)    No flowsheet data found.    CHL IP REASON FOR REFERRAL 09/28/2014  Reason for Referral Objectively evaluate swallowing function     CHL IP ORAL PHASE 09/28/2014  Lips (None)  Tongue (None)  Mucous membranes (None)  Nutritional status (None)  Other (None)  Oxygen therapy (None)  Oral Phase Impaired  Oral - Pudding Teaspoon (None)  Oral - Pudding Cup (None)  Oral - Honey Teaspoon (None)  Oral - Honey Cup (None)  Oral - Honey Syringe (None)  Oral - Nectar Teaspoon (None)  Oral - Nectar Cup (None)  Oral - Nectar Straw (None)  Oral - Nectar Syringe (None)  Oral -  Ice Chips (None)  Oral - Thin Teaspoon (None)  Oral - Thin Cup (None)  Oral - Thin Straw (None)  Oral - Thin Syringe (None)  Oral - Puree (None)  Oral - Mechanical Soft (None)  Oral - Regular (None)  Oral - Multi-consistency (None)  Oral - Pill (None)  Oral Phase - Comment (None)      CHL IP PHARYNGEAL PHASE 09/28/2014  Pharyngeal Phase Impaired  Pharyngeal - Pudding Teaspoon (None)  Penetration/Aspiration details (pudding teaspoon) (None)  Pharyngeal - Pudding Cup (None)  Penetration/Aspiration details (pudding cup) (None)  Pharyngeal - Honey Teaspoon (None)  Penetration/Aspiration details (honey teaspoon) (None)  Pharyngeal - Honey Cup (None)  Penetration/Aspiration details (honey cup) (None)  Pharyngeal - Honey Syringe (None)  Penetration/Aspiration details (honey syringe) (None)  Pharyngeal - Nectar Teaspoon (None)  Penetration/Aspiration details (nectar teaspoon) (None)  Pharyngeal - Nectar Cup (None)  Penetration/Aspiration details (nectar cup) (None)  Pharyngeal - Nectar Straw (None)  Penetration/Aspiration details (nectar straw) (None)  Pharyngeal - Nectar Syringe (None)  Penetration/Aspiration details (nectar syringe) (None)  Pharyngeal - Ice Chips (None)  Penetration/Aspiration details (ice chips) (None)  Pharyngeal - Thin Teaspoon (None)  Penetration/Aspiration details (thin teaspoon) (None)  Pharyngeal - Thin Cup (None)  Penetration/Aspiration details (thin cup) (None)  Pharyngeal - Thin Straw (None)  Penetration/Aspiration details (thin straw) (None)  Pharyngeal - Thin Syringe (None)  Penetration/Aspiration details (thin syringe') (None)  Pharyngeal - Puree (None)  Penetration/Aspiration details (puree) (None)  Pharyngeal - Mechanical Soft (None)  Penetration/Aspiration details (mechanical soft) (None)  Pharyngeal - Regular (None)  Penetration/Aspiration details (regular) (None)  Pharyngeal - Multi-consistency (None)  Penetration/Aspiration details (multi-consistency) (None)  Pharyngeal - Pill (None)   Penetration/Aspiration details (pill) (None)  Pharyngeal Comment (None)      CHL IP CERVICAL ESOPHAGEAL PHASE 09/28/2014  Cervical Esophageal Phase Impaired  Pudding Teaspoon (None)  Pudding Cup (None)  Honey Teaspoon (None)  Honey Cup (None)  Honey Straw (None)  Nectar Teaspoon (None)  Nectar Cup (None)  Nectar Straw (None)  Nectar Sippy Cup (None)  Thin Teaspoon (None)  Thin Cup (None)  Thin Straw (None)  Thin Sippy Cup (None)  Cervical Esophageal Comment decreased opening of cervical esophagus.     No flowsheet data found.         DeBlois, Katherene Ponto 09/28/2014, 2:50 PM     Current facility-administered medications:  .  amiodarone (PACERONE) tablet 200 mg, 200 mg, Oral, BID, Belva Crome, MD .  antiseptic oral rinse (CPC / CETYLPYRIDINIUM CHLORIDE 0.05%) solution 7 mL, 7 mL, Mouth Rinse, QID, Chesley Mires, MD, 7 mL at 09/29/14 1112 .  atorvastatin (LIPITOR) tablet 10 mg, 10 mg, Oral, q1800, Chesley Mires, MD, 10 mg at 09/27/14 1650 .  budesonide (PULMICORT) nebulizer solution 0.5 mg, 0.5 mg, Nebulization, BID, Gershon Mussel, RRT, 0.5 mg at 09/29/14 0907 .  carvedilol (COREG) tablet 3.125 mg, 3.125 mg, Per Tube, BID WC, Rhonda G Barrett, PA-C, 3.125 mg at 09/28/14 0925 .  chlorhexidine (PERIDEX) 0.12 % solution 15 mL, 15 mL, Mouth Rinse, BID, Chesley Mires, MD, 15 mL at 09/29/14 0900 .  digoxin (LANOXIN) 0.25 MG/ML injection 0.0625 mg, 0.0625 mg, Intravenous, Daily, Belva Crome, MD, 0.0625 mg at 09/29/14 1100 .  diphenhydrAMINE (BENADRYL) injection 12.5 mg, 12.5 mg, Intravenous, Q8H PRN, Erick Colace, NP, 12.5 mg at 09/28/14 2250 .  feeding supplement (VITAL AF 1.2 CAL) liquid 1,000 mL, 1,000 mL, Per Tube, Continuous, Clayton Bibles, RD .  free water 240 mL, 240 mL, Per Tube, 6 times per day, Erick Colace, NP, 240 mL at 09/29/14 1406 .  furosemide (LASIX) injection 40 mg, 40 mg, Intravenous, Q8H, Erick Colace, NP, 40 mg at 09/29/14 1108 .  Gerhardt's butt cream, , Topical, TID, Brand Males, MD .  ipratropium-albuterol (DUONEB) 0.5-2.5 (3) MG/3ML nebulizer solution 3 mL, 3 mL, Nebulization, Q6H, Corey Harold, NP, 3 mL at 09/29/14 0907 .  levalbuterol (XOPENEX) nebulizer solution 0.63 mg, 0.63 mg, Nebulization, Q3H PRN, Corey Harold, NP .  magnesium oxide (MAG-OX) tablet 400 mg, 400 mg, Per Tube, Daily, Erick Colace, NP .  potassium chloride 20 MEQ/15ML (10%) solution 40 mEq, 40 mEq, Per Tube, Q8H, Erick Colace, NP .  senna (SENOKOT) tablet 8.6 mg, 1 tablet, Oral, Daily, Waldemar Dickens, MD, 8.6 mg at 09/27/14 0926 .  sodium chloride 0.9 % injection 3 mL, 3 mL, Intravenous, Q12H, Waldemar Dickens, MD, 3 mL at 09/29/14 1100 Signed: Mariem Skolnick,PETE 09/29/2014, 1:44 PM

## 2014-09-29 NOTE — Progress Notes (Signed)
Rt gave flutter valve to pt. Pt knows and understands how to use.  

## 2014-09-29 NOTE — Progress Notes (Signed)
Patient Name: Nathan [name] Date of Encounter: 09/29/2014  Principal Problem:   Sepsis Active Problems:   HCAP (healthcare-associated pneumonia)   Colitis   Constipation   AKI (acute kidney injury)   CKD (chronic kidney disease)   Acute systolic congestive heart failure   HLD (hyperlipidemia)   GERD without esophagitis   Shock   Malnutrition of moderate degree   Acute respiratory failure with hypoxia   Shock circulatory   ATN (acute tubular necrosis)   Mitral regurgitation   Abdominal pain   Paroxysmal atrial fibrillation   Pleural effusion, right   Primary Cardiologist: Dr Katrinka BlazingSmith  Patient Profile: 74 y.o. male with hx chronic S/D CHF, CABG x4 (2013), COPD, CKD, HTN, PVD s/p bilat aorto-iliac stenting (2007), RCEA (2013), CVA, PAF s/p ablation on coumadin, LGIB (2014), PE & hip fx 06/2014, and mod MR admitted 09/21/2014 w/ constipation, SOB, sepsis 2/2 HCAP and colitis, severe coagulopathy with INR >10, ARF and hypotension. Intubated 05/12>05/18, effusions, s/p tap 05/18 1000 cc out, but had small PTX.  SUBJECTIVE: Doing well. Completely awake and alert. Talking and pleasant. Wants gingerale. No complaints. No CP or SOB.  OBJECTIVE Filed Vitals:   09/29/14 0324 09/29/14 0400 09/29/14 0500 09/29/14 0600  BP:  142/47  155/79  Pulse:  77  87  Temp: 97.6 F (36.4 C)     TempSrc: Axillary     Resp:  16  31  Height:      Weight:   201 lb 11.5 oz (91.5 kg)   SpO2:  100%  100%    Intake/Output Summary (Last 24 hours) at 09/29/14 0736 Last data filed at 09/29/14 0600  Gross per 24 hour  Intake 404.67 ml  Output   1060 ml  Net -655.33 ml   Filed Weights   09/27/14 0500 09/28/14 0500 09/29/14 0500  Weight: 215 lb 9.8 oz (97.8 kg) 206 lb 5.6 oz (93.6 kg) 201 lb 11.5 oz (91.5 kg)    PHYSICAL EXAM General: Well developed, well nourished, male in no acute distress. Head: Normocephalic, atraumatic.  Neck: Supple without bruits, JVD 9 cm. Lungs:  Resp regular and  unlabored, decreased BS bases w/ rales. Heart: Irreg irreg, S1, S2, no S3, S4, 3/6 murmur; no rub. Abdomen: Soft, non-tender, + distended, BS not heard.  Extremities: No clubbing, cyanosis, 2-3+ pedal edema.  Neuro: Alert and oriented X 2. Moves all extremities spontaneously. Much more oriented toady Psych: Normal affect.  LABS: CBC:  Recent Labs  09/27/14 0410 09/28/14 0440 09/29/14 0431  WBC 4.4 5.7 4.3  NEUTROABS 2.9  --   --   HGB 9.0* 9.7* 8.9*  HCT 30.9* 33.9* 31.0*  MCV 83.1 86.0 87.1  PLT 59* 75* 76*   INR:  Recent Labs  09/29/14 0431  INR 1.48   Basic Metabolic Panel: Recent Labs  09/26/14 2211 09/27/14 0410  09/28/14 0440 09/29/14 0431  NA  --   --   < > 147* 154*  K  --   --   < > 4.1 3.5  CL  --   --   < > 113* 117*  CO2  --   --   < > 25 26  GLUCOSE  --   --   < > 81 101*  BUN  --   --   < > 77* 77*  CREATININE  --   --   < > 2.42* 2.15*  CALCIUM  --   --   < >  8.5* 8.7*  MG 2.0 2.0  --   --   --   PHOS  --  3.0  --   --  4.1  < > = values in this interval not displayed. Liver Function Tests:  Recent Labs  09/27/14 1645  PROT 6.3*   BNP:  B NATRIURETIC PEPTIDE  Date/Time Value Ref Range Status  10/02/14 09:59 PM 1350.8* 0.0 - 100.0 pg/mL Final  07/02/2014 04:10 AM 703.3* 0.0 - 100.0 pg/mL Final   Fasting Lipid Panel:  Recent Labs  09/27/14 1645  CHOL 78    DIGOXIN LEVEL  Date Value Ref Range Status  09/27/2014 0.8 0.8 - 2.0 ng/mL Final    TELE:  Atrial fib, rate generally controlled, no pauses > 3 sec, no HR sustained < 50. 1 period of slower HR, but not sustained < 50.  1 run of NSVT, 8 bts   Radiology/Studies: Dg Chest Port 1 View 09/28/2014   CLINICAL DATA:  Pleural effusion.  EXAM: PORTABLE CHEST - 1 VIEW  COMPARISON:  09/27/2014.  FINDINGS: Left IJ line noted in good anatomic position. Mediastinum hilar structures normal. Cardiomegaly with normal pulmonary vascularity. Prior CABG. Interim slight improvement of bibasilar  atelectasis and/or infiltrates. Interim slight improvement of bilateral pleural effusions. Persistent but improved questionable medial right base pneumothorax.  IMPRESSION: 1. Left IJ line in stable position. 2. Prior CABG.Stable cardiomegaly.  Pulmonary vascularity normal . 3. Slight improvement of bibasilar atelectasis and/or infiltrates. Slight improvement of bilateral pleural effusions. 4. Slight improvement of questionable medial right base pneumothorax.   Electronically Signed   By: Maisie Fus  Register   On: 09/28/2014 07:34   Dg Chest Port 1 View 09/27/2014   CLINICAL DATA:  RIGHT pneumothorax, hypertension, COPD  EXAM: PORTABLE CHEST - 1 VIEW  COMPARISON:  Portable exam 1858 hours compared to 1555 hours  FINDINGS: LEFT jugular central venous catheter with tip projecting over SVC.  Enlargement of cardiac silhouette post CABG.  Mediastinal contours stable with minimal atherosclerotic calcification aorta.  RIGHT basilar effusion and question loculated medial basilar pneumothorax unchanged.  Bibasilar atelectasis.  Tiny LEFT effusion.  No new infiltrate or acute osseous findings.  IMPRESSION: Bibasilar effusions and atelectasis with suspect small loculated medial basilar RIGHT pneumothorax, unchanged.   Electronically Signed   By: Ulyses Southward M.D.   On: 09/27/2014 19:24   Dg Chest Port 1 View 09/27/2014   CLINICAL DATA:  Status post right thoracentesis.  EXAM: PORTABLE CHEST - 1 VIEW  COMPARISON:  Single view of the chest earlier this same day.  FINDINGS: The patient's endotracheal tube and NG tube have been removed. Left IJ catheter remains in place.  Right pleural effusion is decreased in size after thoracentesis. There is a new lenticular lucency projecting over the right lower chest suspicious for a small anterior pneumothorax. Small left pleural effusion is again seen. There is no left pneumothorax. Bibasilar atelectasis is noted.  IMPRESSION: Decreased right pleural effusion after thoracentesis with a  likely small anterior pneumothorax identified.  No change in a small left pleural effusion and basilar atelectasis.  Status post extubation and NG tube removal.  Critical Value/emergent results were called by telephone at the time of interpretation on 09/27/2014 at 4:14 pm to Santiago Glad, R.N., who verbally acknowledged these results.   Electronically Signed   By: Drusilla Kanner M.D.   On: 09/27/2014 16:15   Dg Chest Port 1 View 09/27/2014   CLINICAL DATA:  SOB today; f/u right pleural effusion  EXAM: PORTABLE  CHEST - 1 VIEW  COMPARISON:  the previous day's study  FINDINGS: Endotracheal tube, nasogastric tube, left IJ central line stable in position. Previous CABG. Stable mild cardiomegaly. Bilateral pleural effusions right greater than left unchanged. Patchy atelectasis or infiltrates in both lung bases persist. Mild central pulmonary vascular congestion.  IMPRESSION: 1. Stable pleural effusions and bilateral infiltrates or edema. 2.  Support hardware stable in position.   Electronically Signed   By: Corlis Leak M.D.   On: 09/27/2014 09:03   Dg Chest Port 1 View 09/26/2014   CLINICAL DATA:  Hospital admission for heart failure. Hypertension. COPD.  EXAM: PORTABLE CHEST - 1 VIEW  COMPARISON:  09/24/2014 and 09/22/2014  FINDINGS: Nasogastric tube courses into the stomach and off the inferior portion of the film as tip is not visualized. Endotracheal tube has tip approximately 3.5 cm above the carina. Left IJ central venous catheter is unchanged with tip over the SVC.  Lungs are adequately inflated with persistent moderate size right pleural effusion and small left pleural effusion. Slight worsening airspace opacification over the right mid to lower lung and stable opacification over the left mid to lower lung. Findings likely represent combination of atelectasis as well as vascular congestion, although cannot exclude infection. Cardiomediastinal silhouette and remainder the exam is unchanged.  IMPRESSION:  Slight worsening opacification over the right mid to lower lung with stable opacification of the left mid to lower lung. Findings likely represent combination of atelectasis and vascular congestion although cannot exclude infection. Persistent moderate size right effusion and small left effusion.  Tubes and lines as described.   Electronically Signed   By: Elberta Fortis M.D.   On: 09/26/2014 09:57     Current Medications:  . antiseptic oral rinse  7 mL Mouth Rinse QID  . atorvastatin  10 mg Oral q1800  . budesonide (PULMICORT) nebulizer solution  0.5 mg Nebulization BID  . carvedilol  3.125 mg Per Tube BID WC  . chlorhexidine  15 mL Mouth Rinse BID  . digoxin  0.0625 mg Intravenous Daily  . Gerhardt's butt cream   Topical TID  . ipratropium-albuterol  3 mL Nebulization Q6H  . levofloxacin (LEVAQUIN) IV  750 mg Intravenous Q48H  . senna  1 tablet Oral Daily  . sodium chloride  3 mL Intravenous Q12H   . dextrose 5 % and 0.45% NaCl 40 mL/hr at 09/29/14 0423    ASSESSMENT AND PLAN: ZERICK PREVETTE is a 74 y.o. male with a history of chronic combine S/D CHF, CAD s/p CABG x4 (2013), COPD, CKD, HTN, PVD s/p bilat aorto-iliac stenting (2007), carotid artery disease s/p RCEA (2013), CVA, PAF s/p ablation on coumadin, previous LGIB (2014), recent PE after hip fracture and mod MR who presented to [facility] on [facility]10[facility]2014 w/ constipation and SOB and found to have sepsis 2/2 HCAP and colitis. Patient also noted to have severe coagulopathy with INR >10, acute renal failure and hypotension. He had been improving until 5/12 early am with rapid deterioration with obtundation, bradycardia, and hypoxia (50% O2 sat). Intubated till 05/18. 2D ECHO revealed new severe MR and recommended TEE to further investigate. Cardiology consulted for new valvular disease and further testing.   Severe MR- 2D ECHO 06/2014 with EF 40% with diffuse HK and mod MR. Limited 2D ECHO this admission with stable LVEF but now severe MR and  suggesting TEE to further investigate. Also 2D ECHO suggestive of RV pressure/volume overload, mod TR and mild pulm HTN.  -- Dr Katrinka Blazing, his  primary cardiologist believes he has significant MR based upon exam and prior echo studies. MR is likely related to papillary muscle dysfunction in this patient with h/o prior inferior MI.  AF with RVR is not helping the overall situation. BB therapy was been withdrawn due to hypotension (presumed), restarted on BB since BP improved. Unless there is concern about endocarditis(duration of antibiotic therapy), TEE will not add much and may be harmful given co-morbidities and tenuous hemodynamics. He would not be a candidate for repeat open heart surgery.  AF RVR- Will continue dig to control VR to atrial fib. Loaded 5/16>09/26/2014. Dig level 0.8 05/18. Started on .0625 mg daily 05/18. Currently rates are much better controlled. HR in 90s -- Started on low dose of BB: Coreg 3.125 mg bid  NSVT- PVCs but and 1 brief run NSVT overnight. Previously had multiple short runs on tele. Continue BB.   Acute hypoxemic respiratory failure - per CCM, extubated 05/18  Shock - septic vs cardiogenic-->shock resolved. Now off pressors.   Acute on chronic systolic/diastolic CHF - echo 1/61/095/13/16: ef 45% - Lasix gtt d/c'd 05/18.  - CXR 09/28/14 with 1. Increasing right pleural effusion with increasing fluid in the fissure. Lucency about the right heart border, may reflect aerated lung adjacent to pleural fluid versus small pneumothorax. 2. Unchanged left pleural effusion. 3. Increasing bilateral perihilar atelectasis. - Possible medial rt ptx, suspect that this is ex vacuo following R thoracentesis on 5/18 (transudate) - Volume status per CCM  Acute on CKD--> creat improved: peak 3.97 (05/12) 3.37--> 2.9 --> 2.8--> 2.6-->2.42 --> 2.15 (05/20)  Coumadin coagulopathy - Hold coumadin. INR 1.48 today. Pharmacy following INR will need to eventually go back on coumadin (5/19 plts  75) - has been sub-therapeutic since 05/14, if restoration of SR planned, will need TEE.  Thrombocytopenia:  Plt up to 76 today  Acute metabolic encephalopathy/Dementia- worsening recently per son. Mental status has improved a lot today. He appears back at his baseline.    Billy FischerSigned, Cleophas Yoak R , PA-C 7:36 AM 09/29/2014

## 2014-09-29 NOTE — Progress Notes (Signed)
ANTICOAGULATION CONSULT NOTE - Initial Consult  Pharmacy Consult for Heparin Indication: atrial fibrillation  No Known Allergies  Patient Measurements: Height: 6\' 1"  (185.4 cm) Weight: 201 lb 11.5 oz (91.5 kg) IBW/kg (Calculated) : 79.9 Heparin Dosing Weight: 91.5  Vital Signs: Temp: 97.6 F (36.4 C) (05/20 1508) Temp Source: Oral (05/20 1508) BP: 142/68 mmHg (05/20 1508) Pulse Rate: 74 (05/20 1508)  Labs:  Recent Labs  09/27/14 0410 09/27/14 0930 09/28/14 0440 09/29/14 0431  HGB 9.0*  --  9.7* 8.9*  HCT 30.9*  --  33.9* 31.0*  PLT 59*  --  75* 76*  APTT  --   --   --  39*  LABPROT 19.8*  --  18.4* 18.0*  INR 1.66*  --  1.51* 1.48  CREATININE  --  2.50* 2.42* 2.15*    Estimated Creatinine Clearance: 33.5 mL/min (by C-G formula based on Cr of 2.15).   Medical History: Past Medical History  Diagnosis Date  . Aorto-iliac disease     a. s/p bilat with stent 2007 - followed by [facility].  . CAD (coronary artery disease)     a. 10/2011 s/p CABG x4 (LIMA-LAD, SVG-Diag, SVG-OM1 and SVG-PDA)  b. 03/2012 NSTEMI s/p LHC SVG-RCA and LIMA-LAD but total occlusion of SVG-OM as well as SVG-diag. RX therapy recommended   . Hypertension   . ED (erectile dysfunction)   . COPD, severe   . Hyperlipidemia   . Stroke   . GERD (gastroesophageal reflux disease)   . Gout   . Paroxysmal atrial flutter     ablation  . Chronic combined systolic and diastolic CHF (congestive heart failure)     a. Last echo 04/2014: EF 40-45%.  Marland Kitchen. PAF (paroxysmal atrial fibrillation)   . CKD (chronic kidney disease), stage III   . Tobacco abuse   . History of noncompliance with medical treatment   . Carotid artery disease     a. RCEA 2013 - followed by [facility].  . Anemia   . Lower GI bleed     a. LGIB 10/2012: Colonoscopy performed June 21 showed blood in the colon but no definite source identified, diverticular versus AVM suspected. Aspirin stopped.  . Mitral regurgitation   . Hypertensive heart disease   .  PE (pulmonary embolism)     Medications:  Scheduled:  . amiodarone  200 mg Oral BID  . antiseptic oral rinse  7 mL Mouth Rinse QID  . atorvastatin  10 mg Oral q1800  . budesonide (PULMICORT) nebulizer solution  0.5 mg Nebulization BID  . carvedilol  3.125 mg Per Tube BID WC  . chlorhexidine  15 mL Mouth Rinse BID  . free water  240 mL Per Tube 6 times per day  . furosemide  40 mg Intravenous Q8H  . Gerhardt's butt cream   Topical TID  . ipratropium-albuterol  3 mL Nebulization Q6H  . magnesium oxide  400 mg Per Tube Daily  . potassium chloride  40 mEq Per Tube Q8H  . senna  1 tablet Oral Daily  . sodium chloride  3 mL Intravenous Q12H   Infusions:  . feeding supplement (VITAL AF 1.2 CAL)      Assessment: [age] yoM w/ PMH AFib, recent PE, COPD, CKD3, HFpEF admitted 5/9 with colitis and pneumosepsis from HCAP.  Complicated hospital course requiring mechanical ventilation and pressor support.  Noted thrombocytopenia starting 5/15 and HIT panel ordered, which resulted negative.  Remains in Afib with rate controlled by BBs and digoxin.  Cardiology started  amiodarone today without loading dose, stopped digoxin.  To resume anticoagulation with heparin per CCM.   INR supratherapeutic on admission, received multiple doses Vit K during admission; currently subtherapeutic  APTT: wnl (subtherapeutic)  Prior anticoagulation: warfarin with recent PE following hip surgery  CrCl 33.5 ml/min  Significant events: See PMH above  Today, 09/29/2014:  CBC: Hgb remains low but steady around 9; Plt appears to have reached nadir and now recovering.  HIT panel negative on 5/18.  Goal of Therapy: Heparin level 0.3-0.7 units/ml Monitor platelets by anticoagulation protocol: Yes  Plan:  No heparin bolus  Heparin 1250 units/hr IV infusion  Check heparin level 8 hrs after start  Daily CBC, daily heparin level once stable  Monitor for signs of bleeding or thrombosis  Follow-up plans for  TEE/cardioversion and resuming PO anticoagulation   Bernadene Personrew Jencarlo Bonadonna, PharmD Pager: [phone] 09/29/2014, 4:16 PM

## 2014-09-29 NOTE — Progress Notes (Signed)
Nutrition Follow-up  DOCUMENTATION CODES:  Non-severe (moderate) malnutrition in context of chronic illness  INTERVENTION:  D/C Vital HP  Initiate Vital AF 1.2 @ 20 ml/hr via OGT and increase by 10 ml every 6 hours to goal rate of 65 ml/hr.   Tube feeding regimen provides 1872 kcal (104% of needs), 117 grams of protein, and 1265 ml of H2O.   RD to continue to monitor  NUTRITION DIAGNOSIS:  Inadequate oral intake related to inability to eat as evidenced by NPO status.  Ongoing.  GOAL:  Patient will meet greater than or equal to 90% of their needs  Unmet.  MONITOR:  Labs, Weight trends, TF tolerance, Skin, I & O's  REASON FOR ASSESSMENT:  Consult Enteral/tube feeding initiation and management  ASSESSMENT: 74 year old male presented to ED 5/9 c/o constipation x 3 days. Admitted for sepsis 2nd to colitis/HCAP. Initially with elevated lactic. AF RVR on coumadin. INR > 10 at presentation. Had been improving until 5/12 early am with rapid deterioration with obtundation, bradycardia, and hypoxia (50% O2 sat). Intubated by CRNA for respiratory insufficiency.  -RD consulted for TF adjustment and management -Pt extubated 5/18. Failed swallow eval 5/19. -Pt ordered Lasix for fluid accumulation -RD to change TF formula and rate to better meet pt's nutritional needs.   Labs reviewed: Elevated Na, BUN & Creatinine  Height:  Ht Readings from Last 1 Encounters:  09/24/14 6\' 1"  (1.854 m)    Weight:  Wt Readings from Last 1 Encounters:  09/29/14 201 lb 11.5 oz (91.5 kg)    Ideal Body Weight:  83.6 kg  Wt Readings from Last 10 Encounters:  09/29/14 201 lb 11.5 oz (91.5 kg)  08/23/14 162 lb (73.483 kg)  08/14/14 156 lb 6.4 oz (70.943 kg)  08/11/14 157 lb 12.8 oz (71.578 kg)  07/31/14 167 lb 1.6 oz (75.796 kg)  07/11/14 165 lb (74.844 kg)  06/23/14 186 lb (84.369 kg)  06/20/14 200 lb (90.719 kg)  06/18/14 187 lb 12.8 oz (85.186 kg)  06/06/14 183 lb (83.008 kg)     BMI:  Body mass index is 26.62 kg/(m^2).  Estimated Nutritional Needs:  Kcal:  1800-2000  Protein:  110-115 g  Fluid:  1.7 L/day   Skin:  Wound (see comment) (Stage II wound on buttocks)  Diet Order:  Diet NPO time specified  EDUCATION NEEDS:  Education needs no appropriate at this time   Intake/Output Summary (Last 24 hours) at 09/29/14 1235 Last data filed at 09/29/14 0700  Gross per 24 hour  Intake 394.67 ml  Output    710 ml  Net -315.33 ml    Last BM:  5/19  Tilda FrancoLindsey Adrinne Sze, MS, RD, LDN Pager: [phone] After Hours Pager: [phone]

## 2014-09-29 NOTE — Progress Notes (Addendum)
PULMONARY / CRITICAL CARE MEDICINE   Name: Nathan [name] MRN: [id number] DOB: Dec 19, 1940    ADMISSION DATE:  10/05/2014 CONSULTATION DATE:  09/22/2014  REFERRING MD :  [place] Pink  CHIEF COMPLAINT:  Respiratory failure  INITIAL PRESENTATION: 74 year old male w/ PMH:  PAF on coumadin, GOLD C COPD, CAD, HTN, systolic/diastolic CHF, CKD III, and PE. He was recently admitted for hip fracture, which was to be managed conservatively. During that admission he was found to have pulmonary embolism and started on heprain >> coumadin. Admitted 5/9 w/ working dx of colitis, HCAP and sepsis w/ elevated lactate. 5/12 early AM he developed bradycardia, hypoxia with sats around 50, and became obtunded. He was intubated by CRNA. PCCM called for consultation.    STUDIES:  Admission CXR > bilateral effusions R>L. 09/29/2014 5/9 CT abd > Small bilateral pleural effusions greater on the right. Consolidation in the right lower lung suggesting pneumonia. Small amount of abdominal and pelvic free fluid. Increased density suggesting sludge versus milk of calcium in the gallbladder. Mild stranding around the sigmoid colon suggesting early diverticulitis.Left inguinal hernia containing fat and fluid. ECHO  Normal LV size with mild LV hypertrophy. EF 45-50% with mild diffusehypokinesis. D-shaped interventricular septum suggests RV pressure/volume overload. Mildly dilated RV with normal systolic function. Moderate TR. Mild pulmonary hypertension. MR appeared severe.   EVENTS Admitted 5/9: colitis, HCAP, sepsis w/ elevated lactate and coagulopathy 5/11: still hypotensive, wbc rising. Aggressive volume resuscitation provided.  5/12 cdiff negative. Felt sepsis from bacterial translocation from gut. ABX continued. More lethargic  5/13: rapid deterioration. he developed bradycardia, hypoxia with sats around 50, and became obtunded. He was intubated by CRNA. In septic shock, placed on pressors  5/14: still on pressors. Severe MR  on echo. Creatinine improved.  5/15 platelets dropped 68, weaning pressors. More awake, but failed SBT. Tolerating tubefeeds.  5/16: off pressors. Failed SBT. Started lasix gtt. Narrowed abx. Seen by cards. They felt MR was due to MR is likely related to papillary muscle dysfunction in this patient with h/o prior inferior MI. Did not think surgical candidate and did not think TEE would add to rx. They added digoxin.  5/17 added precedex, more awake. Changed CTX to levaquin given rash and itching. Changed fentanyl to low dose dilaudid. Increased lasix as did not achieve negative fluid balance.  5/18 cr a little increased, na increased, lasix gtt stopped.  5/18 rt thora 1000 cc; f/u CXR shows small medial PTX (ex vacuo)-->transudate 5/18 extubated 5/19 failed swallow eval 5/20 effusion returned. Added back lasix, placed feeding tube, replaced free water. SLP cont to assist. ABX stopped.   VITAL SIGNS: Temp:  [97.4 F (36.3 C)-97.8 F (36.6 C)] 97.5 F (36.4 C) (05/20 0800) Pulse Rate:  [77-92] 87 (05/20 0600) Resp:  [16-31] 31 (05/20 0600) BP: (114-156)/(46-79) 155/79 mmHg (05/20 0600) SpO2:  [98 %-100 %] 100 % (05/20 0600) Weight:  [91.5 kg (201 lb 11.5 oz)] 91.5 kg (201 lb 11.5 oz) (05/20 0500) Room air  HEMODYNAMICS:   VENTILATOR SETTINGS:   INTAKE / OUTPUT:  Intake/Output Summary (Last 24 hours) at 09/29/14 0841 Last data filed at 09/29/14 0700  Gross per 24 hour  Intake 434.67 ml  Output   1060 ml  Net -625.33 ml    PHYSICAL EXAMINATION: General:  Elderly male, awake, follows commands; no distress.  Neuro:  RASS 0,moves all ext. Oriented X 4; sp a little more clear  HEENT:  /AT, no JVD noted Cardiovascular:  irregular Lungs:  decreased bs bases; no accessory muscle use. Crackle in bases  Abdomen:  Soft, distended, hyperactive BS Musculoskeletal:  No acute deformity, peripheral pulses intact Skin:  + 3 pitting edema to BLE, resolving  rash  LABS:  PULMONARY  Recent Labs Lab 09/23/14 0355 09/26/14 1023  PHART 7.498* 7.482*  PCO2ART 24.7* 27.4*  PO2ART 122* 81.6  HCO3 19.4* 20.3  TCO2 18.0 18.7  O2SAT 99.1 96.8   CBC  Recent Labs Lab 09/27/14 0410 09/28/14 0440 09/29/14 0431  HGB 9.0* 9.7* 8.9*  HCT 30.9* 33.9* 31.0*  WBC 4.4 5.7 4.3  PLT 59* 75* 76*   COAGULATION  Recent Labs Lab 09/25/14 0510 09/26/14 0440 09/27/14 0410 09/28/14 0440 09/29/14 0431  INR 1.77* 1.65* 1.66* 1.51* 1.48   CARDIAC    Recent Labs Lab 09/22/14 1130 09/22/14 1826  TROPONINI 0.08* 0.08*   No results for input(s): PROBNP in the last 168 hours.   CHEMISTRY  Recent Labs Lab 09/24/14 0500 09/25/14 0510  09/25/14 1800 09/26/14 0440 09/26/14 1030 09/26/14 1810 09/26/14 2211 09/27/14 0410 09/27/14 0930 09/28/14 0440 09/29/14 0431  NA 142 143  < > 144 145 147* 148*  --   --  149* 147* 154*  K 3.1* 2.9*  < > 5.4* 4.5 4.0 3.8  --   --  3.4* 4.1 3.5  CL 108 111  < > 113* 112* 114* 115*  --   --  113* 113* 117*  CO2 21* 22  < > 21* _0 --   --  _1 GLUCOSE 112* 112*  < > 115* 118* 108* 111*  --   --  114* 81 101*  BUN 77* 76*  < > 79* 79* 76* 77*  --   --  75* 77* 77*  CREATININE 3.37* 2.90*  < > 2.92* 2.80* 2.60* 2.63*  --   --  2.50* 2.42* 2.15*  CALCIUM 7.4* 7.6*  < > 7.9* 8.0* 7.9* 7.9*  --   --  8.1* 8.5* 8.7*  MG 1.9 2.1  < > 2.2 2.2  2.2 2.1  --  2.0 2.0  --   --   --   PHOS 3.6 3.2  --   --  3.3  --   --   --  3.0  --   --  4.1  < > = values in this interval not displayed. Estimated Creatinine Clearance: 33.5 mL/min (by C-G formula based on Cr of 2.15).  LIVER  Recent Labs Lab 09/23/14 0405  09/25/14 0510 09/26/14 0440 09/27/14 0410 09/27/14 1645 09/28/14 0440 09/29/14 0431  AST 226*  --   --  51*  --   --   --   --   ALT 375*  --   --  149*  --   --   --   --   ALKPHOS 79  --   --  103  --   --   --   --   BILITOT 3.0*  --   --  2.1*  --   --   --   --   PROT 4.6*  --    --  5.6*  --  6.3*  --   --   ALBUMIN 1.8*  --   --  1.9*  --   --   --   --   INR 2.66*  < > 1.77* 1.65* 1.66*  --  1.51* 1.48  < > = values in this interval not  displayed.   INFECTIOUS  Recent Labs Lab 09/23/14 0405 09/24/14 0500  LATICACIDVEN 1.7  --   PROCALCITON 0.69 0.52   ENDOCRINE CBG (last 3)   Recent Labs  09/28/14 2316 09/29/14 0323 09/29/14 0351  GLUCAP 71 63* 98    IMAGING x48h  Dg Chest Port 1 View  09/29/2014   CLINICAL DATA:  Respiratory failure, effusions.  EXAM: PORTABLE CHEST - 1 VIEW  COMPARISON:  Yesterday at 0543 hour  FINDINGS: Tip of the left central line remains in the SVC. Cardiomegaly is unchanged. Increased right pleural effusion with increasing fluid in the fissure. Lucency adjacent to the right heart border may be related to aerated lung adjacent to pleural effusions versus pneumothorax, this has minimally progressed from prior. Left pleural effusion is unchanged. Bibasilar opacities are likely related to compressive atelectasis. Pulmonary vasculature is normal. There is increasing bilateral perihilar atelectasis.  IMPRESSION: 1. Increasing right pleural effusion with increasing fluid in the fissure. Lucency about the right heart border, may reflect aerated lung adjacent to pleural fluid versus small pneumothorax. 2. Unchanged left pleural effusion. 3. Increasing bilateral perihilar atelectasis.   Electronically Signed   By: Jeb Levering M.D.   On: 09/29/2014 05:20   Dg Chest Port 1 View  09/28/2014   CLINICAL DATA:  Pleural effusion.  EXAM: PORTABLE CHEST - 1 VIEW  COMPARISON:  09/27/2014.  FINDINGS: Left IJ line noted in good anatomic position. Mediastinum hilar structures normal. Cardiomegaly with normal pulmonary vascularity. Prior CABG. Interim slight improvement of bibasilar atelectasis and/or infiltrates. Interim slight improvement of bilateral pleural effusions. Persistent but improved questionable medial right base pneumothorax.  IMPRESSION:  1. Left IJ line in stable position. 2. Prior CABG.Stable cardiomegaly.  Pulmonary vascularity normal . 3. Slight improvement of bibasilar atelectasis and/or infiltrates. Slight improvement of bilateral pleural effusions. 4. Slight improvement of questionable medial right base pneumothorax.   Electronically Signed   By: Marcello Moores  Register   On: 09/28/2014 07:34   Dg Chest Port 1 View  09/27/2014   CLINICAL DATA:  RIGHT pneumothorax, hypertension, COPD  EXAM: PORTABLE CHEST - 1 VIEW  COMPARISON:  Portable exam 1858 hours compared to 1555 hours  FINDINGS: LEFT jugular central venous catheter with tip projecting over SVC.  Enlargement of cardiac silhouette post CABG.  Mediastinal contours stable with minimal atherosclerotic calcification aorta.  RIGHT basilar effusion and question loculated medial basilar pneumothorax unchanged.  Bibasilar atelectasis.  Tiny LEFT effusion.  No new infiltrate or acute osseous findings.  IMPRESSION: Bibasilar effusions and atelectasis with suspect small loculated medial basilar RIGHT pneumothorax, unchanged.   Electronically Signed   By: Lavonia Dana M.D.   On: 09/27/2014 19:24   Dg Chest Port 1 View  09/27/2014   CLINICAL DATA:  Status post right thoracentesis.  EXAM: PORTABLE CHEST - 1 VIEW  COMPARISON:  Single view of the chest earlier this same day.  FINDINGS: The patient's endotracheal tube and NG tube have been removed. Left IJ catheter remains in place.  Right pleural effusion is decreased in size after thoracentesis. There is a new lenticular lucency projecting over the right lower chest suspicious for a small anterior pneumothorax. Small left pleural effusion is again seen. There is no left pneumothorax. Bibasilar atelectasis is noted.  IMPRESSION: Decreased right pleural effusion after thoracentesis with a likely small anterior pneumothorax identified.  No change in a small left pleural effusion and basilar atelectasis.  Status post extubation and NG tube removal.  Critical  Value/emergent results were called by telephone  at the time of interpretation on 09/27/2014 at 4:14 pm to Avon Gully, R.N., who verbally acknowledged these results.   Electronically Signed   By: Inge Rise M.D.   On: 09/27/2014 16:15   Dg Chest Port 1 View  09/27/2014   CLINICAL DATA:  SOB today; f/u right pleural effusion  EXAM: PORTABLE CHEST - 1 VIEW  COMPARISON:  the previous day's study  FINDINGS: Endotracheal tube, nasogastric tube, left IJ central line stable in position. Previous CABG. Stable mild cardiomegaly. Bilateral pleural effusions right greater than left unchanged. Patchy atelectasis or infiltrates in both lung bases persist. Mild central pulmonary vascular congestion.  IMPRESSION: 1. Stable pleural effusions and bilateral infiltrates or edema. 2.  Support hardware stable in position.   Electronically Signed   By: Lucrezia Europe M.D.   On: 09/27/2014 09:03   Dg Swallowing Func-speech Pathology  09/28/2014    Objective Swallowing Evaluation:    Patient Details  Name: JEBIDIAH BAGGERLY MRN: [id number] Date of Birth: Jul 06, 1940  Today's Date: 09/28/2014 Time: SLP Start Time (ACUTE ONLY): 1400-SLP Stop Time (ACUTE ONLY): 1425 SLP Time Calculation (min) (ACUTE ONLY): 25 min  Past Medical History:  Past Medical History  Diagnosis Date  . Aorto-iliac disease     a. s/p bilat with stent 2007 - followed by [facility].  . CAD (coronary artery disease)     a. 10/2011 s/p CABG x4 (LIMA-LAD, SVG-Diag, SVG-OM1 and SVG-PDA)  b.  03/2012 NSTEMI s/p LHC SVG-RCA and LIMA-LAD but total occlusion of SVG-OM  as well as SVG-diag. RX therapy recommended   . Hypertension   . ED (erectile dysfunction)   . COPD, severe   . Hyperlipidemia   . Stroke   . GERD (gastroesophageal reflux disease)   . Gout   . Paroxysmal atrial flutter     ablation  . Chronic combined systolic and diastolic CHF (congestive heart failure)     a. Last echo 04/2014: EF 40-45%.  Marland Kitchen PAF (paroxysmal atrial fibrillation)   . CKD (chronic kidney disease),  stage III   . Tobacco abuse   . History of noncompliance with medical treatment   . Carotid artery disease     a. RCEA 2013 - followed by [facility].  . Anemia   . Lower GI bleed     a. LGIB 10/2012: Colonoscopy performed June 21 showed blood in the colon  but no definite source identified, diverticular versus AVM suspected.  Aspirin stopped.  . Mitral regurgitation   . Hypertensive heart disease   . PE (pulmonary embolism)    Past Surgical History:  Past Surgical History  Procedure Laterality Date  . Spinal fusion    . Hand surgery    . Neck fusion  1975  . Eye surgery    . Coronary artery bypass graft  10/31/2011    Procedure: CORONARY ARTERY BYPASS GRAFTING (CABG);  Surgeon: Grace Isaac, MD;  Location: [facility];  Service: Open Heart Surgery;  Laterality:  N/A;  times three using  Left Internal Mammary Artery and Right Greater  Saphenous Vein Graft harvested endoscopically; TEE  . Endarterectomy  10/31/2011    Procedure: ENDARTERECTOMY CAROTID;  Surgeon: Serafina Mitchell, MD;   Location: [facility] OR;  Service: Vascular;  Laterality: Right;  with patch  angioplasty   . Carotid endarterectomy  10-31-2011    right  . Colonoscopy N/A 10/30/2012    Procedure: COLONOSCOPY;  Surgeon: Lear Ng, MD;  Location: [facility]  ENDOSCOPY;  Service: Endoscopy;  Laterality:  N/A;  . Left heart catheterization with coronary angiogram N/A 09/04/2011    Procedure: LEFT HEART CATHETERIZATION WITH CORONARY ANGIOGRAM;  Surgeon:  Sinclair Grooms, MD;  Location: [facility] CATH LAB;  Service: Cardiovascular;   Laterality: N/A;  . Left heart cath N/A 04/03/2012    Procedure: LEFT HEART CATH;  Surgeon: Troy Sine, MD;  Location: [facility]  CATH LAB;  Service: Cardiovascular;  Laterality: N/A;  . Pelvic fracture surgery     HPI:  Other Pertinent Information: 74 year old male admitted 09/14/2014 due to  colitis, HCAP, sepsis. Pt deteriorated and was intubated 09/22/14.  Extubated 09/27/14.  PMH significant for CVA, GERD, COPD. Orders received  for swallow evaluation.   No Data Recorded  Assessment / Plan / Recommendation CHL IP CLINICAL IMPRESSIONS 09/28/2014  Therapy Diagnosis (None)  Clinical Impression Pt demonstrates a severe oropharyngeal dysphagia with  silent penetration and aspiration of all consistencies tested. There is  reduced laryngeal closure during the swallow with all boluses reaching the  vocal folds. Hyolaryngeal excursion is particularly limited resulting in  poor opening of UES. Penetrate and residuals present post swallow are  silently penetrated/aspirated despite cues for multiple swallows and  throat clears. A chin tuck did slightly improve UES opening, but not  enough for any texture to be safely consumed.  Suspect pt likely had a  structural dysphagia prior to admission given curvature of spine and  suspected impact on UES function.  Recommend pt remain NPO with laryngeal  closure exercises and ongoing PO trials. Consider short term method of  nutrition.       CHL IP TREATMENT RECOMMENDATION 09/28/2014  Treatment Recommendations Therapy as outlined in treatment plan below     CHL IP DIET RECOMMENDATION 09/28/2014  SLP Diet Recommendations Alternative means - temporary  Liquid Administration via (None)  Medication Administration Via alternative means  Compensations (None)  Postural Changes and/or Swallow Maneuvers (None)     CHL IP OTHER RECOMMENDATIONS 09/28/2014  Recommended Consults (None)  Oral Care Recommendations Oral care QID  Other Recommendations (None)     CHL IP FOLLOW UP RECOMMENDATIONS 06/15/2014  Follow up Recommendations None     CHL IP FREQUENCY AND DURATION 09/28/2014  Speech Therapy Frequency (ACUTE ONLY) min 3x week  Treatment Duration 2 weeks     Pertinent Vitals/Pain NA    SLP Swallow Goals CHL IP SWALLOW STUDY GOALS 11/14/2011  Patient will consume recommended diet without observed clinical signs of  aspiration with Supervision/safety  Swallow Study Goal #1 - Progress Not Met  Patient will utilize recommended strategies during swallow to  increase  swallowing safety with Supervision/safety  Swallow Study Goal #2 - Progress Not met  Goal #3 (None)  Swallow Study Goal #3 - Progress (None)  Goal #4 (None)  Swallow Study Goal #4 - Progress (None)    No flowsheet data found.    CHL IP REASON FOR REFERRAL 09/28/2014  Reason for Referral Objectively evaluate swallowing function     CHL IP ORAL PHASE 09/28/2014  Lips (None)  Tongue (None)  Mucous membranes (None)  Nutritional status (None)  Other (None)  Oxygen therapy (None)  Oral Phase Impaired  Oral - Pudding Teaspoon (None)  Oral - Pudding Cup (None)  Oral - Honey Teaspoon (None)  Oral - Honey Cup (None)  Oral - Honey Syringe (None)  Oral - Nectar Teaspoon (None)  Oral - Nectar Cup (None)  Oral - Nectar Straw (None)  Oral - Nectar Syringe (None)  Oral -  Ice Chips (None)  Oral - Thin Teaspoon (None)  Oral - Thin Cup (None)  Oral - Thin Straw (None)  Oral - Thin Syringe (None)  Oral - Puree (None)  Oral - Mechanical Soft (None)  Oral - Regular (None)  Oral - Multi-consistency (None)  Oral - Pill (None)  Oral Phase - Comment (None)      CHL IP PHARYNGEAL PHASE 09/28/2014  Pharyngeal Phase Impaired  Pharyngeal - Pudding Teaspoon (None)  Penetration/Aspiration details (pudding teaspoon) (None)  Pharyngeal - Pudding Cup (None)  Penetration/Aspiration details (pudding cup) (None)  Pharyngeal - Honey Teaspoon (None)  Penetration/Aspiration details (honey teaspoon) (None)  Pharyngeal - Honey Cup (None)  Penetration/Aspiration details (honey cup) (None)  Pharyngeal - Honey Syringe (None)  Penetration/Aspiration details (honey syringe) (None)  Pharyngeal - Nectar Teaspoon (None)  Penetration/Aspiration details (nectar teaspoon) (None)  Pharyngeal - Nectar Cup (None)  Penetration/Aspiration details (nectar cup) (None)  Pharyngeal - Nectar Straw (None)  Penetration/Aspiration details (nectar straw) (None)  Pharyngeal - Nectar Syringe (None)  Penetration/Aspiration details (nectar syringe) (None)  Pharyngeal - Ice Chips  (None)  Penetration/Aspiration details (ice chips) (None)  Pharyngeal - Thin Teaspoon (None)  Penetration/Aspiration details (thin teaspoon) (None)  Pharyngeal - Thin Cup (None)  Penetration/Aspiration details (thin cup) (None)  Pharyngeal - Thin Straw (None)  Penetration/Aspiration details (thin straw) (None)  Pharyngeal - Thin Syringe (None)  Penetration/Aspiration details (thin syringe') (None)  Pharyngeal - Puree (None)  Penetration/Aspiration details (puree) (None)  Pharyngeal - Mechanical Soft (None)  Penetration/Aspiration details (mechanical soft) (None)  Pharyngeal - Regular (None)  Penetration/Aspiration details (regular) (None)  Pharyngeal - Multi-consistency (None)  Penetration/Aspiration details (multi-consistency) (None)  Pharyngeal - Pill (None)  Penetration/Aspiration details (pill) (None)  Pharyngeal Comment (None)      CHL IP CERVICAL ESOPHAGEAL PHASE 09/28/2014  Cervical Esophageal Phase Impaired  Pudding Teaspoon (None)  Pudding Cup (None)  Honey Teaspoon (None)  Honey Cup (None)  Honey Straw (None)  Nectar Teaspoon (None)  Nectar Cup (None)  Nectar Straw (None)  Nectar Sippy Cup (None)  Thin Teaspoon (None)  Thin Cup (None)  Thin Straw (None)  Thin Sippy Cup (None)  Cervical Esophageal Comment decreased opening of cervical esophagus.     No flowsheet data found.         DeBlois, Katherene Ponto 09/28/2014, 2:50 PM    I have personally reviewed the films. Worsening/return of right effusion   ASSESSMENT / PLAN:  PULMONARY OETT 5/13 >>>5/18 A: Acute hypoxemic respiratory failure -ventilator dependent COPD without acute exacerbation Bilateral effusions R>L effusion  Suspected HCAP Possible medial rt ptx, suspect that this is ex vacuo following R thoracentesis on 5/18 (transudate) Return of right effusion on film 5/20 Extubated 5/18 Decreased lucency on film 5/20 P:   Follow CXR and R PTX Cont pulm hygiene Resume lasix   CARDIOVASCULAR CVL 5/13 >>> A:  Shock - septic vs  cardiogenic-->shock resolved  AF RVR Acute on chronic systolic/diastolic CHF - echo 3/70/48: ef 45% Severe MR: MR is likely related to papillary muscle dysfunction in this patient with h/o prior inferior MI. >looks like even vol status currently  P:  Resume lasix, but replace free water Serial chemistries w/ mg Cont tele  Pharmacy following INR will need to eventually go back on coumadin  Dig added; continue  Cards does not think TEE good choice given not surgical candidate.   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 Some slow improvement Hypernatremia s/p lasix (worse as of 5/20)  Hyperkalemia  (resolved)  P:   Strict I&O Cont free water   GASTROINTESTINAL A:   Transaminitis, ? Shock liver-->improved  Colitis H/o recent ileus Dysphagia   P:   Follow LFT intermittently  Place feeding tube, tubefeeds w/ SLP follow to re-assess  HEMATOLOGIC A:   Coumadin coagulopathy Anemia, appears chronic  - INR improved but plat dropped but prob due to sepsis P:  Follow CBC Hold coumadin given low platelets, resume heparin/coumadin bridge once PLTs > 100 Follow Coags and platelet PAS  INFECTIOUS Body fluid 5/18>> BCx2 5/9 >>>neg  UC 5/10 neg Cdif 5/11 > neg Sputum 5/13 >>>candida  A:   Severe sepsis d/t colitis, and presumed bacterial translocation  prob HCAP P:   Abx: Zosyn, start date 5/10 >>>5/16 Abx: Vancomycin, start date 5/10 >>>5/16 abx rocephin 5/16>>>5/17 (stopped d/t itching) levaquin 5/17>>>5/20  ENDOCRINE A:   No acute issues P:   Follow glucose on Bmet Add SSI if consistently greater than 180.  NEUROLOGIC A:   Acute metabolic encephalopathy-->resolved  Physical deconditioning  P:   Dc sedating meds  Aggressive PT/OT Derm A: rash/urticaria  P: Changed abx to levaquin PRN benadryl   FAMILY Updated daily   09/29/2014, 8:41 AM  Looks stronger. Effusion a little worse. Will add back lasix, but will need to aggressively  replace free water, place feeding tube, cont rehab focused efforts.   Erick Colace ACNP-BC [place] Pager # [phone] OR # [phone] if no answer  Attending Note:  I have examined patient, reviewed labs, studies and notes. I have discussed the case with Jerrye Bushy, and I agree with the data and plans as amended above.    Baltazar Apo, MD, PhD 09/30/2014, 12:41 PM [facility] [phone] or if no answer [phone]

## 2014-09-29 NOTE — Progress Notes (Signed)
eLink Physician-Brief Progress Note Patient Name: Nathan KellMaurice W [name] DOB: 04/16/1941 MRN: [id number]   Date of Service  09/29/2014  HPI/Events of Note  Hypoglycemia. Blood glucose = 63. Na+ = 147. Not on Insulin, however, not eating.   eICU Interventions  Change IV fluids to D5 0.45 NaCl to run IV at 40 mL/hour.      Intervention Category Intermediate Interventions: Other:  Jeanluc Wegman Dennard Nipugene 09/29/2014, 4:02 AM

## 2014-09-29 NOTE — Progress Notes (Signed)
Agree with ICU RN's shift assessment.

## 2014-09-29 NOTE — Progress Notes (Signed)
Speech Language Pathology Treatment: Dysphagia  Patient Details Name: Nathan [name] MRN: [id number] DOB: 01/30/1941 Today's Date: 09/29/2014 Time: 1140-1200 SLP Time Calculation (min) (ACUTE ONLY): 20 min  Assessment / Plan / Recommendation Clinical Impression  Led pt in over 20 repetitions of supraglottic swallow with moderate verbal cues and modeling provided. Intermittently pt observed to have wet vocal quality and reflexive cough with expectoration of pharyngeal residuals. Provided instruction for independent exercises with education to RN and sister. Hopeful that pt may have modest improvement in swallowing for initiation of PO with lower risk. Unlikely that dysphagia will fully resolve. Pt may have a few ice chips with supervision after oral care, RN/family to aid pt in exercises at that time.    HPI Other Pertinent Information: 74 year old male admitted 09/20/2014 due to colitis, HCAP, sepsis. Pt deteriorated and was intubated 09/22/14. Extubated 09/27/14.  PMH significant for CVA, GERD, COPD. Orders received for swallow evaluation.   Pertinent Vitals Pain Assessment: No/denies pain  SLP Plan  Continue with current plan of care    Recommendations Diet recommendations: NPO (except for ice chips after oral care, exercises) Medication Administration: Via alternative means              Oral Care Recommendations: Oral care QID Plan: Continue with current plan of care    GO    [facility] And White Healthcare - LlanoBonnie Ryliee Figge, MA CCC-SLP [phone].  Nathan [name], Nathan [name] 09/29/2014, 12:50 PM

## 2014-09-29 NOTE — Progress Notes (Signed)
Hypoglycemic Event  CBG: 63  Treatment: D50 IV 25 mL  Symptoms: None  Follow-up CBG: Time: 0345 CBG Result: 98  Possible Reasons for Event: Inadequate meal intake/NPO  Comments/MD notified: Melynda RippleELINK    Carola Viramontes J Sammie Denner  Remember to initiate Hypoglycemia Order Set & complete

## 2014-09-30 ENCOUNTER — Inpatient Hospital Stay (HOSPITAL_COMMUNITY): Payer: Medicare PPO

## 2014-09-30 DIAGNOSIS — E876 Hypokalemia: Secondary | ICD-10-CM

## 2014-09-30 DIAGNOSIS — K5909 Other constipation: Secondary | ICD-10-CM

## 2014-09-30 DIAGNOSIS — R109 Unspecified abdominal pain: Secondary | ICD-10-CM

## 2014-09-30 DIAGNOSIS — I472 Ventricular tachycardia: Secondary | ICD-10-CM

## 2014-09-30 DIAGNOSIS — N183 Chronic kidney disease, stage 3 (moderate): Secondary | ICD-10-CM

## 2014-09-30 DIAGNOSIS — E440 Moderate protein-calorie malnutrition: Secondary | ICD-10-CM

## 2014-09-30 LAB — HEPARIN LEVEL (UNFRACTIONATED)
HEPARIN UNFRACTIONATED: 0.1 [IU]/mL — AB (ref 0.30–0.70)
HEPARIN UNFRACTIONATED: 0.2 [IU]/mL — AB (ref 0.30–0.70)
HEPARIN UNFRACTIONATED: 0.27 [IU]/mL — AB (ref 0.30–0.70)

## 2014-09-30 LAB — PROTIME-INR
INR: 1.51 — ABNORMAL HIGH (ref 0.00–1.49)
PROTHROMBIN TIME: 18.3 s — AB (ref 11.6–15.2)

## 2014-09-30 LAB — GLUCOSE, CAPILLARY
GLUCOSE-CAPILLARY: 88 mg/dL (ref 65–99)
GLUCOSE-CAPILLARY: 88 mg/dL (ref 65–99)
GLUCOSE-CAPILLARY: 94 mg/dL (ref 65–99)
Glucose-Capillary: 82 mg/dL (ref 65–99)
Glucose-Capillary: 61 mg/dL — ABNORMAL LOW (ref 65–99)
Glucose-Capillary: 116 mg/dL — ABNORMAL HIGH (ref 65–99)
Glucose-Capillary: 72 mg/dL (ref 65–99)

## 2014-09-30 LAB — CBC
HCT: 32.5 % — ABNORMAL LOW (ref 39.0–52.0)
HEMOGLOBIN: 9.2 g/dL — AB (ref 13.0–17.0)
MCH: 24.5 pg — ABNORMAL LOW (ref 26.0–34.0)
MCHC: 28.3 g/dL — ABNORMAL LOW (ref 30.0–36.0)
MCV: 86.4 fL (ref 78.0–100.0)
Platelets: 98 10*3/uL — ABNORMAL LOW (ref 150–400)
RBC: 3.76 MIL/uL — ABNORMAL LOW (ref 4.22–5.81)
RDW: 22.4 % — ABNORMAL HIGH (ref 11.5–15.5)
WBC: 5.1 10*3/uL (ref 4.0–10.5)

## 2014-09-30 LAB — COMPREHENSIVE METABOLIC PANEL
ALBUMIN: 2.2 g/dL — AB (ref 3.5–5.0)
ALT: 66 U/L — AB (ref 17–63)
ANION GAP: 11 (ref 5–15)
AST: 31 U/L (ref 15–41)
Alkaline Phosphatase: 85 U/L (ref 38–126)
BILIRUBIN TOTAL: 1.8 mg/dL — AB (ref 0.3–1.2)
BUN: 67 mg/dL — ABNORMAL HIGH (ref 6–20)
CO2: 26 mmol/L (ref 22–32)
CREATININE: 2.16 mg/dL — AB (ref 0.61–1.24)
Calcium: 8.9 mg/dL (ref 8.9–10.3)
Chloride: 116 mmol/L — ABNORMAL HIGH (ref 101–111)
GFR calc Af Amer: 33 mL/min — ABNORMAL LOW (ref >60)
GFR calc non Af Amer: 28 mL/min — ABNORMAL LOW (ref >60)
Glucose, Bld: 86 mg/dL (ref 65–99)
Potassium: 3.2 mmol/L — ABNORMAL LOW (ref 3.5–5.1)
Sodium: 153 mmol/L — ABNORMAL HIGH (ref 135–145)
TOTAL PROTEIN: 6.3 g/dL — AB (ref 6.5–8.1)

## 2014-09-30 LAB — ANA, BODY FLUID: Anti-Nuclear Ab, IgG: NOT DETECTED

## 2014-09-30 MED ORDER — HEPARIN (PORCINE) IN NACL 100-0.45 UNIT/ML-% IJ SOLN
1950.0000 [IU]/h | INTRAMUSCULAR | Status: DC
  Administered 2014-09-30 – 2014-10-03 (×5): 1950 [IU]/h via INTRAVENOUS
  Filled 2014-09-30 (×8): qty 250

## 2014-09-30 MED ORDER — LORAZEPAM 2 MG/ML IJ SOLN
1.0000 mg | Freq: Once | INTRAMUSCULAR | Status: AC
  Administered 2014-09-30: 1 mg via INTRAVENOUS
  Filled 2014-09-30: qty 1

## 2014-09-30 MED ORDER — POTASSIUM CHLORIDE 20 MEQ/15ML (10%) PO SOLN
60.0000 meq | Freq: Once | ORAL | Status: AC
  Administered 2014-09-30: 60 meq
  Filled 2014-09-30: qty 45

## 2014-09-30 MED ORDER — IPRATROPIUM-ALBUTEROL 0.5-2.5 (3) MG/3ML IN SOLN
3.0000 mL | Freq: Three times a day (TID) | RESPIRATORY_TRACT | Status: DC
  Administered 2014-10-01 – 2014-10-03 (×8): 3 mL via RESPIRATORY_TRACT
  Filled 2014-09-30 (×9): qty 3

## 2014-09-30 MED ORDER — DEXTROSE 50 % IV SOLN
INTRAVENOUS | Status: AC
  Administered 2014-09-30: 25 mL
  Filled 2014-09-30: qty 50

## 2014-09-30 MED ORDER — DIPHENHYDRAMINE HCL 12.5 MG/5ML PO ELIX
12.5000 mg | ORAL_SOLUTION | Freq: Once | ORAL | Status: AC
  Administered 2014-09-30: 12.5 mg
  Filled 2014-09-30: qty 5

## 2014-09-30 MED ORDER — HEPARIN (PORCINE) IN NACL 100-0.45 UNIT/ML-% IJ SOLN
1750.0000 [IU]/h | INTRAMUSCULAR | Status: DC
  Administered 2014-09-30: 1550 [IU]/h via INTRAVENOUS
  Filled 2014-09-30 (×2): qty 250

## 2014-09-30 NOTE — Progress Notes (Signed)
[facility] PROGRESS NOTE  ELGIN CARN KHT:[id number] DOB: Sep 04, 1940 DOA: 10/08/2014 PCP: Irven Shelling, MD   Brief narrative: 74 year old male with history of paroxysmal A. fib on Coumadin, goal C COPD, CAD, hypertension, systolic and diastolic CHF, chronic kidney disease stage III and history of PE was recently hospitalized for hip fracture that was managed conservatively. During the admission he was found to have PE and discharged on Coumadin.   Hospital course 5/9:  patient was admitted to the hospitalist service with colitis, sepsis due to healthcare associated pneumonia. 5/12: Patient became bradycardic, hypoxic and obtunded, intubated and transferred to ICU. 5/13: Had rapid deterioration with persistent bradycardia and found to be in septic shock requiring pressors. Worsened renal function. 5/14: Maintain on pressors. Echo done showed severe MR. 5/15: Drop in platelets (68) , weaned pressors but failed extubation. Started on tube feeds. 5/16:off pressors but failed spontaneous breathing trial. Started on Lasix drip and narrowed antibiotics. 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 Recommended patient will not tolerate surgery at this time and TEE may not contribute much. Added digoxin. 5/17: Patient more awake. Added Precedex. Switch ceftriaxone to Levaquin as patient . lasix dose increased . 5/18: Renal function worsened so Lasix drip was stopped. Right-sided thoracentesis done for pleural effusion with 1000 mL transited to fluid removed. Follow-up chest x-ray showed a small medial pneumothorax (ex vacuo). Patient was successfully extubated. 5/19: Stable ICU. Failed swallow evaluation. 5/20: Effusion returned. Lasix was added back. Placed panda tube. Pt  hypernatremic and started on free water. Antibiotic discontinued. 5/21: Patient transferred hospitalist service on telemetry. Patient  pulled out panda tube which was replaced.        Assessment/Plan:  Septic versus cardiogenic shock Now resolved. Off pressors.   Acute respiratory failure with hypoxemia Secondary to CHF and pneumonia. Clinically improving.   Acute on chronic combined systolic and diastolic CHF -2-D echo from 5/13 with EF of 45% with severe MR. 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 Cardiology following. -Lasix resumed. Not a candidate for valve replacement surgery at this time.  -Monitor strict I/O  Hypernatremia Continue free water via NG tube  A. fib with RVR Currently rate controlled. Initially loaded with digoxin. Now on low-dose Coreg and amiodarone. Cardiology plan   on TEE/cardioversion prior to discharge. On IV heparin.  Holding Coumadin due to thrombocytopenia.  HCAP Completed abx course.  Acute on chronic kidney injury Improved today. Continue to monitor  Thrombocytopenia Platelets improving. Possibly related to sepsis. HIT panel negative.  Metabolic encephalopathy Mental status seems to have improved.  Right ex-vacuo pneumothorax.  Stable. Continue to monitor.  History of PE On Coumadin which is on hold due to thrombocytopenia. Continue to monitor while on heparin gtt.   hypokalemia Replenished   Diet: tube  Feeding  DVT prophylaxis: On IV heparin.  Code Status: partial. ( no CPR/ defibrillator or cardioversion, ok with intubation and vasoactive drugs) Family Communication: sister joyce at bedside Disposition Plan: currently inpatient   Consultants:  PCCM  cardiology  Procedures:  Intubation   2D ECHO  Antibiotics:  Completed on 5/20  HPI/Subjective: Pulled panda out this am, replaced back. Denies any pain , has some shortness of breath  Objective: Filed Vitals:   09/30/14 0523  BP: 139/81  Pulse: 56  Temp: 98 F (36.7 C)  Resp: 24    Intake/Output Summary (Last 24 hours) at 09/30/14 1116 Last data filed at 09/30/14 [phone]  Gross per 24 hour  Intake  24.17 ml  Output   2150 ml  Net -2125.83 ml   Filed Weights   09/28/14 0500 09/29/14 0500 09/30/14 0523  Weight: 93.6 kg (206 lb 5.6 oz) 91.5 kg (201 lb 11.5 oz) 87.5 kg (192 lb 14.4 oz)    Exam:   General:  Elderly frail male in NAD  HEENT: no pallor, dry oral mucosa, JVD+, lt IJ  Cardiovascular: B3&Z3 regular, systolic murmur 3/6, no rubs or gallop  Respiratory: scattered rhonchi, coarse breath sounds b/l  GI: soft, NT, ND, BS+  musculoskeletal: 2+ pitting edema b/l, scrotal edema, foley in place   CNS: alert and oriented     Data Reviewed: Basic Metabolic Panel:  Recent Labs Lab 09/24/14 0500 09/25/14 0510  09/25/14 1800 09/26/14 0440 09/26/14 1030 09/26/14 1810 09/26/14 2211 09/27/14 0410 09/27/14 0930 09/28/14 0440 09/29/14 0431 09/30/14 0133  NA 142 143  < > 144 145 147* 148*  --   --  149* 147* 154* 153*  K 3.1* 2.9*  < > 5.4* 4.5 4.0 3.8  --   --  3.4* 4.1 3.5 3.2*  CL 108 111  < > 113* 112* 114* 115*  --   --  113* 113* 117* 116*  CO2 21* 22  < > 21* _0 --   --  _1 GLUCOSE 112* 112*  < > 115* 118* 108* 111*  --   --  114* 81 101* 86  BUN 77* 76*  < > 79* 79* 76* 77*  --   --  75* 77* 77* 67*  CREATININE 3.37* 2.90*  < > 2.92* 2.80* 2.60* 2.63*  --   --  2.50* 2.42* 2.15* 2.16*  CALCIUM 7.4* 7.6*  < > 7.9* 8.0* 7.9* 7.9*  --   --  8.1* 8.5* 8.7* 8.9  MG 1.9 2.1  < > 2.2 2.2  2.2 2.1  --  2.0 2.0  --   --   --   --   PHOS 3.6 3.2  --   --  3.3  --   --   --  3.0  --   --  4.1  --   < > = values in this interval not displayed. Liver Function Tests:  Recent Labs Lab 09/26/14 0440 09/27/14 1645 09/30/14 0133  AST 51*  --  31  ALT 149*  --  66*  ALKPHOS 103  --  85  BILITOT 2.1*  --  1.8*  PROT 5.6* 6.3* 6.3*  ALBUMIN 1.9*  --  2.2*   No results for input(s): LIPASE, AMYLASE in the last 168 hours. No results for input(s): AMMONIA in the last 168 hours. CBC:  Recent Labs Lab 09/24/14 0500  09/25/14 0510 09/26/14 0440 09/27/14 0410 09/28/14 0440 09/29/14 0431 09/30/14 0133  WBC 6.4 5.7 6.2 4.4 5.7 4.3 5.1  NEUTROABS 5.2 4.2 4.3 2.9  --   --   --   HGB 9.7* 9.7* 9.8* 9.0* 9.7* 8.9* 9.2*  HCT 32.6* 31.8* 33.2* 30.9* 33.9* 31.0* 32.5*  MCV 81.9 81.7 82.8 83.1 86.0 87.1 86.4  PLT 68* 57* 57* 59* 75* 76* 98*   Cardiac Enzymes:  Recent Labs Lab 09/24/14 0500  CKTOTAL 93  CKMB 3.2   BNP (last 3 results)  Recent Labs  06/26/14 0357 07/02/14 0410 09/16/2014 2159  BNP 626.8* 703.3* 1350.8*    ProBNP (last 3 results)  Recent Labs  01/04/14 0450 04/21/14 1504  PROBNP 3786.0* 13092.0*    CBG:  Recent Labs Lab 09/29/14 2127 09/30/14 0201 09/30/14 0427 09/30/14 0756 09/30/14 0844  GLUCAP 98 82 88 61* 116*    Recent Results (from the past 240 hour(s))  Clostridium Difficile by PCR     Status: None   Collection Time: 09/20/14  4:35 PM  Result Value Ref Range Status   C difficile by pcr NEGATIVE NEGATIVE Final  Culture, respiratory (NON-Expectorated)     Status: None   Collection Time: 09/23/14  1:15 PM  Result Value Ref Range Status   Specimen Description TRACHEAL ASPIRATE  Final   Special Requests NONE  Final   Gram Stain   Final    RARE WBC PRESENT, PREDOMINANTLY PMN RARE SQUAMOUS EPITHELIAL CELLS PRESENT RARE YEAST Performed at [facility]    Culture   Final    MODERATE CANDIDA ALBICANS Performed at [facility]    Report Status 09/25/2014 FINAL  Final  Body fluid culture     Status: None (Preliminary result)   Collection Time: 09/27/14  3:50 PM  Result Value Ref Range Status   Specimen Description PLEURAL  Final   Special Requests NONE  Final   Gram Stain   Final    RARE WBC PRESENT, PREDOMINANTLY MONONUCLEAR NO ORGANISMS SEEN Performed at [facility]    Culture   Final    NO GROWTH 1 DAY Performed at [facility]    Report Status PENDING  Incomplete     Studies: Dg Abd 1 View  09/30/2014    CLINICAL DATA:  Encounter for nasogastric tube placement.  EXAM: ABDOMEN - 1 VIEW  COMPARISON:  One day prior at 1038 hour  FINDINGS: Two portable AP supine views submitted. Initial image demonstrates weighted feeding tube tip in the mid chest in the region of the mid-esophagus. Subsequent image demonstrates advancement of the feeding tube with tip below the diaphragm in the stomach. There is contrast within the colon from prior modified barium swallow.  IMPRESSION: Tip of the weighted enteric tube in the stomach after advancement.   Electronically Signed   By: Jeb Levering M.D.   On: 09/30/2014 01:28   Dg Abd 1 View  09/29/2014   CLINICAL DATA:  Feeding tube placement  EXAM: ABDOMEN - 1 VIEW  COMPARISON:  Portable exam 1038 hours compared to 09/15/2014  FINDINGS: Tip of feeding tube at gastric fundus.  Nonobstructive bowel gas pattern.  Small amount of contrast retained in distal ileum.  Air-filled colon, upper normal caliber.  RIGHT pleural effusion and LEFT lower lobe infiltrate identified.  IMPRESSION: Tip feeding tube coiled in proximal stomach with tip at fundus.   Electronically Signed   By: Lavonia Dana M.D.   On: 09/29/2014 11:03   Dg Chest Port 1 View  09/30/2014   CLINICAL DATA:  Followup right pleural effusion.  EXAM: PORTABLE CHEST - 1 VIEW  COMPARISON:  Yesterday.  FINDINGS: No significant change in a small to moderate size right pleural effusion and right basilar airspace opacity. Mild increase in size of a small left pleural effusion. Stable enlarged cardiac silhouette. Mild diffuse increase in prominence of the interstitial markings. Stable post CABG changes. Feeding tube extending into the stomach. Left jugular catheter tip in the superior vena cava. No pneumothorax.  IMPRESSION: 1. Mildly increased interstitial pulmonary edema. 2. Mild increased small left pleural effusion. 3. Stable small to moderate-sized right pleural effusion and associated right basilar probable atelectasis. 4.  Stable cardiomegaly.  Electronically Signed   By: Claudie Revering M.D.   On: 09/30/2014 09:27   Dg Chest Port 1 View  09/29/2014   CLINICAL DATA:  Respiratory failure, effusions.  EXAM: PORTABLE CHEST - 1 VIEW  COMPARISON:  Yesterday at 0543 hour  FINDINGS: Tip of the left central line remains in the SVC. Cardiomegaly is unchanged. Increased right pleural effusion with increasing fluid in the fissure. Lucency adjacent to the right heart border may be related to aerated lung adjacent to pleural effusions versus pneumothorax, this has minimally progressed from prior. Left pleural effusion is unchanged. Bibasilar opacities are likely related to compressive atelectasis. Pulmonary vasculature is normal. There is increasing bilateral perihilar atelectasis.  IMPRESSION: 1. Increasing right pleural effusion with increasing fluid in the fissure. Lucency about the right heart border, may reflect aerated lung adjacent to pleural fluid versus small pneumothorax. 2. Unchanged left pleural effusion. 3. Increasing bilateral perihilar atelectasis.   Electronically Signed   By: Jeb Levering M.D.   On: 09/29/2014 05:20   Dg Abd Portable 1v  09/30/2014   CLINICAL DATA:  Feeding tube placement.  EXAM: PORTABLE ABDOMEN - 1 VIEW  COMPARISON:  Study at 0050 hours  FINDINGS: Feeding tube shows further advancement with the tip now in the proximal stomach at the level of the fundus.  IMPRESSION: Feeding tube tip now in the proximal stomach.   Electronically Signed   By: Aletta Edouard M.D.   On: 09/30/2014 11:02   Dg Swallowing Func-speech Pathology  09/28/2014    Objective Swallowing Evaluation:    Patient Details  Name: IRFAN VEAL MRN: [id number] Date of Birth: 07-25-1940  Today's Date: 09/28/2014 Time: SLP Start Time (ACUTE ONLY): 1400-SLP Stop Time (ACUTE ONLY): 1425 SLP Time Calculation (min) (ACUTE ONLY): 25 min  Past Medical History:  Past Medical History  Diagnosis Date  . Aorto-iliac disease     a. s/p bilat with  stent 2007 - followed by [facility].  . CAD (coronary artery disease)     a. 10/2011 s/p CABG x4 (LIMA-LAD, SVG-Diag, SVG-OM1 and SVG-PDA)  b.  03/2012 NSTEMI s/p LHC SVG-RCA and LIMA-LAD but total occlusion of SVG-OM  as well as SVG-diag. RX therapy recommended   . Hypertension   . ED (erectile dysfunction)   . COPD, severe   . Hyperlipidemia   . Stroke   . GERD (gastroesophageal reflux disease)   . Gout   . Paroxysmal atrial flutter     ablation  . Chronic combined systolic and diastolic CHF (congestive heart failure)     a. Last echo 04/2014: EF 40-45%.  Marland Kitchen PAF (paroxysmal atrial fibrillation)   . CKD (chronic kidney disease), stage III   . Tobacco abuse   . History of noncompliance with medical treatment   . Carotid artery disease     a. RCEA 2013 - followed by [facility].  . Anemia   . Lower GI bleed     a. LGIB 10/2012: Colonoscopy performed June 21 showed blood in the colon  but no definite source identified, diverticular versus AVM suspected.  Aspirin stopped.  . Mitral regurgitation   . Hypertensive heart disease   . PE (pulmonary embolism)    Past Surgical History:  Past Surgical History  Procedure Laterality Date  . Spinal fusion    . Hand surgery    . Neck fusion  1975  . Eye surgery    . Coronary artery bypass graft  10/31/2011    Procedure: CORONARY ARTERY BYPASS GRAFTING (CABG);  Surgeon: Grace Isaac, MD;  Location: [facility];  Service: Open Heart Surgery;  Laterality:  N/A;  times three using  Left Internal Mammary Artery and Right Greater  Saphenous Vein Graft harvested endoscopically; TEE  . Endarterectomy  10/31/2011    Procedure: ENDARTERECTOMY CAROTID;  Surgeon: Serafina Mitchell, MD;   Location: [facility] OR;  Service: Vascular;  Laterality: Right;  with patch  angioplasty   . Carotid endarterectomy  10-31-2011    right  . Colonoscopy N/A 10/30/2012    Procedure: COLONOSCOPY;  Surgeon: Lear Ng, MD;  Location: [facility]  ENDOSCOPY;  Service: Endoscopy;  Laterality: N/A;  . Left heart catheterization with coronary  angiogram N/A 09/04/2011    Procedure: LEFT HEART CATHETERIZATION WITH CORONARY ANGIOGRAM;  Surgeon:  Sinclair Grooms, MD;  Location: [facility] CATH LAB;  Service: Cardiovascular;   Laterality: N/A;  . Left heart cath N/A 04/03/2012    Procedure: LEFT HEART CATH;  Surgeon: Troy Sine, MD;  Location: [facility]  CATH LAB;  Service: Cardiovascular;  Laterality: N/A;  . Pelvic fracture surgery     HPI:  Other Pertinent Information: 74 year old male admitted 10/06/2014 due to  colitis, HCAP, sepsis. Pt deteriorated and was intubated 09/22/14.  Extubated 09/27/14.  PMH significant for CVA, GERD, COPD. Orders received  for swallow evaluation.  No Data Recorded  Assessment / Plan / Recommendation CHL IP CLINICAL IMPRESSIONS 09/28/2014  Therapy Diagnosis (None)  Clinical Impression Pt demonstrates a severe oropharyngeal dysphagia with  silent penetration and aspiration of all consistencies tested. There is  reduced laryngeal closure during the swallow with all boluses reaching the  vocal folds. Hyolaryngeal excursion is particularly limited resulting in  poor opening of UES. Penetrate and residuals present post swallow are  silently penetrated/aspirated despite cues for multiple swallows and  throat clears. A chin tuck did slightly improve UES opening, but not  enough for any texture to be safely consumed.  Suspect pt likely had a  structural dysphagia prior to admission given curvature of spine and  suspected impact on UES function.  Recommend pt remain NPO with laryngeal  closure exercises and ongoing PO trials. Consider short term method of  nutrition.       CHL IP TREATMENT RECOMMENDATION 09/28/2014  Treatment Recommendations Therapy as outlined in treatment plan below     CHL IP DIET RECOMMENDATION 09/28/2014  SLP Diet Recommendations Alternative means - temporary  Liquid Administration via (None)  Medication Administration Via alternative means  Compensations (None)  Postural Changes and/or Swallow Maneuvers (None)     CHL IP OTHER  RECOMMENDATIONS 09/28/2014  Recommended Consults (None)  Oral Care Recommendations Oral care QID  Other Recommendations (None)     CHL IP FOLLOW UP RECOMMENDATIONS 06/15/2014  Follow up Recommendations None     CHL IP FREQUENCY AND DURATION 09/28/2014  Speech Therapy Frequency (ACUTE ONLY) min 3x week  Treatment Duration 2 weeks     Pertinent Vitals/Pain NA    SLP Swallow Goals CHL IP SWALLOW STUDY GOALS 11/14/2011  Patient will consume recommended diet without observed clinical signs of  aspiration with Supervision/safety  Swallow Study Goal #1 - Progress Not Met  Patient will utilize recommended strategies during swallow to increase  swallowing safety with Supervision/safety  Swallow Study Goal #2 - Progress Not met  Goal #3 (None)  Swallow Study Goal #3 - Progress (None)  Goal #4 (None)  Swallow Study Goal #4 - Progress (None)    No flowsheet data found.  CHL IP REASON FOR REFERRAL 09/28/2014  Reason for Referral Objectively evaluate swallowing function     CHL IP ORAL PHASE 09/28/2014  Lips (None)  Tongue (None)  Mucous membranes (None)  Nutritional status (None)  Other (None)  Oxygen therapy (None)  Oral Phase Impaired  Oral - Pudding Teaspoon (None)  Oral - Pudding Cup (None)  Oral - Honey Teaspoon (None)  Oral - Honey Cup (None)  Oral - Honey Syringe (None)  Oral - Nectar Teaspoon (None)  Oral - Nectar Cup (None)  Oral - Nectar Straw (None)  Oral - Nectar Syringe (None)  Oral - Ice Chips (None)  Oral - Thin Teaspoon (None)  Oral - Thin Cup (None)  Oral - Thin Straw (None)  Oral - Thin Syringe (None)  Oral - Puree (None)  Oral - Mechanical Soft (None)  Oral - Regular (None)  Oral - Multi-consistency (None)  Oral - Pill (None)  Oral Phase - Comment (None)      CHL IP PHARYNGEAL PHASE 09/28/2014  Pharyngeal Phase Impaired  Pharyngeal - Pudding Teaspoon (None)  Penetration/Aspiration details (pudding teaspoon) (None)  Pharyngeal - Pudding Cup (None)  Penetration/Aspiration details (pudding cup) (None)  Pharyngeal -  Honey Teaspoon (None)  Penetration/Aspiration details (honey teaspoon) (None)  Pharyngeal - Honey Cup (None)  Penetration/Aspiration details (honey cup) (None)  Pharyngeal - Honey Syringe (None)  Penetration/Aspiration details (honey syringe) (None)  Pharyngeal - Nectar Teaspoon (None)  Penetration/Aspiration details (nectar teaspoon) (None)  Pharyngeal - Nectar Cup (None)  Penetration/Aspiration details (nectar cup) (None)  Pharyngeal - Nectar Straw (None)  Penetration/Aspiration details (nectar straw) (None)  Pharyngeal - Nectar Syringe (None)  Penetration/Aspiration details (nectar syringe) (None)  Pharyngeal - Ice Chips (None)  Penetration/Aspiration details (ice chips) (None)  Pharyngeal - Thin Teaspoon (None)  Penetration/Aspiration details (thin teaspoon) (None)  Pharyngeal - Thin Cup (None)  Penetration/Aspiration details (thin cup) (None)  Pharyngeal - Thin Straw (None)  Penetration/Aspiration details (thin straw) (None)  Pharyngeal - Thin Syringe (None)  Penetration/Aspiration details (thin syringe') (None)  Pharyngeal - Puree (None)  Penetration/Aspiration details (puree) (None)  Pharyngeal - Mechanical Soft (None)  Penetration/Aspiration details (mechanical soft) (None)  Pharyngeal - Regular (None)  Penetration/Aspiration details (regular) (None)  Pharyngeal - Multi-consistency (None)  Penetration/Aspiration details (multi-consistency) (None)  Pharyngeal - Pill (None)  Penetration/Aspiration details (pill) (None)  Pharyngeal Comment (None)      CHL IP CERVICAL ESOPHAGEAL PHASE 09/28/2014  Cervical Esophageal Phase Impaired  Pudding Teaspoon (None)  Pudding Cup (None)  Honey Teaspoon (None)  Honey Cup (None)  Honey Straw (None)  Nectar Teaspoon (None)  Nectar Cup (None)  Nectar Straw (None)  Nectar Sippy Cup (None)  Thin Teaspoon (None)  Thin Cup (None)  Thin Straw (None)  Thin Sippy Cup (None)  Cervical Esophageal Comment decreased opening of cervical esophagus.     No flowsheet data found.          DeBlois, Katherene Ponto 09/28/2014, 2:50 PM     Scheduled Meds: . amiodarone  200 mg Oral BID  . antiseptic oral rinse  7 mL Mouth Rinse QID  . atorvastatin  10 mg Oral q1800  . budesonide (PULMICORT) nebulizer solution  0.5 mg Nebulization BID  . carvedilol  3.125 mg Per Tube BID WC  . chlorhexidine  15 mL Mouth Rinse BID  . free water  240 mL Per Tube 6 times per day  . Gerhardt's butt cream   Topical TID  . ipratropium-albuterol  3 mL Nebulization Q6H  . lipase/protease/amylase  2 capsule Oral Once   And  . sodium bicarbonate  650 mg Oral Once  . magnesium oxide  400 mg Per Tube Daily  . potassium chloride  60 mEq Per Tube Once  . senna  1 tablet Oral Daily  . sodium chloride  3 mL Intravenous Q12H   Continuous Infusions: . feeding supplement (VITAL AF 1.2 CAL) 1,000 mL (09/29/14 1719)  . heparin 1,550 Units/hr (09/30/14 1013)      Time spent: 35 minutes    Tynia Wiers, [place]  [facility] Pager [phone]. If 7PM-7AM, please contact night-coverage at [email or web address], password [facility] 09/30/2014, 11:16 AM  LOS: 11 days

## 2014-09-30 NOTE — Progress Notes (Signed)
Patient pulled panda tube out this am. MD notified and new panda placed by [name], RN from ICU. Confirmed placement for use with second abd film. Spoke with Dr. Gonzella Lexhungel okay to use. Tube feedings resumed. CBG 88 at this time. Will continue to closely monitor patient. Nathan [name], Nathan BroachAllison Marie

## 2014-09-30 NOTE — Progress Notes (Signed)
Patient Profile: 74 y.o. male with hx chronic S/D CHF, CABG x4 (2013), COPD, CKD, HTN, PVD s/p bilat aorto-iliac stenting (2007), RCEA (2013), CVA, PAF s/p ablation on coumadin, LGIB (2014), PE & hip fx 06/2014, and mod MR admitted 09/19/2014 w/ constipation, SOB, sepsis 2/2 HCAP and colitis, severe coagulopathy with INR >10, ARF and hypotension. Intubated 05/12>05/18, effusions, s/p tap 05/18 1000 cc out, but had small PTX.   SUBJECTIVE: Just pulled feeding tube and vomited. However, says he feels better than yesterday with regards to his breathing. Denies chest pain.     Intake/Output Summary (Last 24 hours) at 09/30/14 0914 Last data filed at 09/30/14 6213  Gross per 24 hour  Intake  24.17 ml  Output   2150 ml  Net -2125.83 ml    Current Facility-Administered Medications  Medication Dose Route Frequency Provider Last Rate Last Dose  . amiodarone (PACERONE) tablet 200 mg  200 mg Oral BID Belva Crome, MD   200 mg at 09/29/14 1705  . antiseptic oral rinse (CPC / CETYLPYRIDINIUM CHLORIDE 0.05%) solution 7 mL  7 mL Mouth Rinse QID Chesley Mires, MD   7 mL at 09/30/14 0151  . atorvastatin (LIPITOR) tablet 10 mg  10 mg Oral q1800 Chesley Mires, MD   10 mg at 09/29/14 1705  . budesonide (PULMICORT) nebulizer solution 0.5 mg  0.5 mg Nebulization BID Gershon Mussel, RRT   0.5 mg at 09/30/14 0741  . carvedilol (COREG) tablet 3.125 mg  3.125 mg Per Tube BID WC Rhonda G Barrett, PA-C   3.125 mg at 09/29/14 1705  . chlorhexidine (PERIDEX) 0.12 % solution 15 mL  15 mL Mouth Rinse BID Chesley Mires, MD   15 mL at 09/30/14 0812  . feeding supplement (VITAL AF 1.2 CAL) liquid 1,000 mL  1,000 mL Per Tube Continuous Clayton Bibles, RD 65 mL/hr at 09/29/14 1719 1,000 mL at 09/29/14 1719  . free water 240 mL  240 mL Per Tube 6 times per day Erick Colace, NP   240 mL at 09/30/14 0400  . Gerhardt's butt cream   Topical TID Brand Males, MD   1 application at 08/65/78 2200  . heparin ADULT infusion 100  units/mL (25000 units/250 mL)  1,550 Units/hr Intravenous Continuous Leann T Poindexter, RPH 15.5 mL/hr at 09/30/14 0519 1,550 Units/hr at 09/30/14 0519  . ipratropium-albuterol (DUONEB) 0.5-2.5 (3) MG/3ML nebulizer solution 3 mL  3 mL Nebulization Q6H Corey Harold, NP   3 mL at 09/30/14 0741  . levalbuterol (XOPENEX) nebulizer solution 0.63 mg  0.63 mg Nebulization Q3H PRN Corey Harold, NP      . lipase/protease/amylase (CREON) capsule 24,000 Units  2 capsule Oral Once Collene Gobble, MD       And  . sodium bicarbonate tablet 650 mg  650 mg Oral Once Collene Gobble, MD      . magnesium oxide (MAG-OX) tablet 400 mg  400 mg Per Tube Daily Erick Colace, NP   400 mg at 09/29/14 1705  . potassium chloride 20 MEQ/15ML (10%) solution 40 mEq  40 mEq Per Tube Q8H Erick Colace, NP   40 mEq at 09/30/14 4696  . senna (SENOKOT) tablet 8.6 mg  1 tablet Oral Daily Waldemar Dickens, MD   8.6 mg at 09/27/14 0926  . sodium chloride 0.9 % injection 10-40 mL  10-40 mL Intracatheter PRN Collene Gobble, MD      . sodium chloride 0.9 % injection  3 mL  3 mL Intravenous Q12H Waldemar Dickens, MD   3 mL at 09/29/14 1100    Filed Vitals:   09/29/14 2032 09/30/14 0523 09/30/14 0741 09/30/14 0743  BP: 134/57 139/81    Pulse: 51 56    Temp: 98 F (36.7 C) 98 F (36.7 C)    TempSrc: Oral Oral    Resp: 22 24    Height:      Weight:  192 lb 14.4 oz (87.5 kg)    SpO2: 87% 87% 98% 98%    PHYSICAL EXAM General: Slightly tachypneic HEENT: Normal. Neck: No JVD, no thyromegaly.  Lungs: Wheezes anteriorly. CV: Irregular rhythm, normal S1/S2, no S3, 3/6 apical holosystolic murmur.  2+ pretibial edema.  Abdomen: Firm, +distention.  Neurologic: Alert and oriented.  Psych: Normal affect. Musculoskeletal: No gross deformities.  TELEMETRY: Reviewed telemetry pt in atrial fibrillation, 7-beat run of NSVT, frequent PVC's  LABS: Basic Metabolic Panel:  Recent Labs  09/29/14 0431 09/30/14 0133  NA 154* 153*  K  3.5 3.2*  CL 117* 116*  CO2 26 26  GLUCOSE 101* 86  BUN 77* 67*  CREATININE 2.15* 2.16*  CALCIUM 8.7* 8.9  PHOS 4.1  --    Liver Function Tests:  Recent Labs  09/27/14 1645 09/30/14 0133  AST  --  31  ALT  --  66*  ALKPHOS  --  85  BILITOT  --  1.8*  PROT 6.3* 6.3*  ALBUMIN  --  2.2*   No results for input(s): LIPASE, AMYLASE in the last 72 hours. CBC:  Recent Labs  09/29/14 0431 09/30/14 0133  WBC 4.3 5.1  HGB 8.9* 9.2*  HCT 31.0* 32.5*  MCV 87.1 86.4  PLT 76* 98*   Cardiac Enzymes: No results for input(s): CKTOTAL, CKMB, CKMBINDEX, TROPONINI in the last 72 hours. BNP: Invalid input(s): POCBNP D-Dimer: No results for input(s): DDIMER in the last 72 hours. Hemoglobin A1C: No results for input(s): HGBA1C in the last 72 hours. Fasting Lipid Panel:  Recent Labs  09/27/14 1645  CHOL 78   Thyroid Function Tests: No results for input(s): TSH, T4TOTAL, T3FREE, THYROIDAB in the last 72 hours.  Invalid input(s): FREET3 Anemia Panel: No results for input(s): VITAMINB12, FOLATE, FERRITIN, TIBC, IRON, RETICCTPCT in the last 72 hours.  RADIOLOGY: Ct Abdomen Pelvis Wo Contrast  10/05/2014   CLINICAL DATA:  Constipation for 5 days and bilateral leg swelling for 3 weeks. History of COPD and PE. No IV or oral contrast material per order of the referring physician.  EXAM: CT ABDOMEN AND PELVIS WITHOUT CONTRAST  TECHNIQUE: Multidetector CT imaging of the abdomen and pelvis was performed following the standard protocol without IV contrast.  COMPARISON:  None.  FINDINGS: Small bilateral pleural effusions, greater on the right. Basilar atelectasis. Consolidation in the right lower lung posteriorly probably represents pneumonia. Postoperative changes in the mediastinum. Mild cardiac enlargement.  Small amount of free fluid in the abdomen and extending along the pericolic gutters into the pelvis. This is likely ascites. Increased density in the gallbladder may represent sludge or  milk of calcium. No bile duct dilatation. Unenhanced appearance of the liver, spleen, pancreas, adrenal glands, inferior vena cava, and retroperitoneal lymph nodes is unremarkable. Calcifications in both kidneys probably representing vascular calcification. No hydronephrosis or hydroureter. Calcification throughout the abdominal aorta without aneurysm. Stenosis at the aortic bifurcation is probable. Vascular stents in the iliac arteries. Stomach, small bowel, and colon are not abnormally distended. Stool fills the colon. No free  air in the abdomen.  Pelvis: Stool-filled rectosigmoid colon. There is some stranding around the sigmoid region which may indicate early changes of acute diverticulitis. No abscess. Bladder wall is not thickened. Prostate gland is not enlarged. Appendix is normal. Small left inguinal hernia containing fat and fluid. Degenerative changes in the spine and hips. Old fracture deformity of the right inferior pubic ramus.  IMPRESSION: Small bilateral pleural effusions greater on the right. Consolidation in the right lower lung suggesting pneumonia. Small amount of abdominal and pelvic free fluid. Increased density suggesting sludge versus milk of calcium in the gallbladder. Mild stranding around the sigmoid colon suggesting early diverticulitis. No abscess. Left inguinal hernia containing fat and fluid.   Electronically Signed   By: Lucienne Capers M.D.   On: 09/13/2014 23:34   Dg Abd 1 View  09/30/2014   CLINICAL DATA:  Encounter for nasogastric tube placement.  EXAM: ABDOMEN - 1 VIEW  COMPARISON:  One day prior at 1038 hour  FINDINGS: Two portable AP supine views submitted. Initial image demonstrates weighted feeding tube tip in the mid chest in the region of the mid-esophagus. Subsequent image demonstrates advancement of the feeding tube with tip below the diaphragm in the stomach. There is contrast within the colon from prior modified barium swallow.  IMPRESSION: Tip of the weighted  enteric tube in the stomach after advancement.   Electronically Signed   By: Jeb Levering M.D.   On: 09/30/2014 01:28   Dg Abd 1 View  09/29/2014   CLINICAL DATA:  Feeding tube placement  EXAM: ABDOMEN - 1 VIEW  COMPARISON:  Portable exam 1038 hours compared to 09/17/2014  FINDINGS: Tip of feeding tube at gastric fundus.  Nonobstructive bowel gas pattern.  Small amount of contrast retained in distal ileum.  Air-filled colon, upper normal caliber.  RIGHT pleural effusion and LEFT lower lobe infiltrate identified.  IMPRESSION: Tip feeding tube coiled in proximal stomach with tip at fundus.   Electronically Signed   By: Lavonia Dana M.D.   On: 09/29/2014 11:03   Dg Chest Port 1 View  09/29/2014   CLINICAL DATA:  Respiratory failure, effusions.  EXAM: PORTABLE CHEST - 1 VIEW  COMPARISON:  Yesterday at 0543 hour  FINDINGS: Tip of the left central line remains in the SVC. Cardiomegaly is unchanged. Increased right pleural effusion with increasing fluid in the fissure. Lucency adjacent to the right heart border may be related to aerated lung adjacent to pleural effusions versus pneumothorax, this has minimally progressed from prior. Left pleural effusion is unchanged. Bibasilar opacities are likely related to compressive atelectasis. Pulmonary vasculature is normal. There is increasing bilateral perihilar atelectasis.  IMPRESSION: 1. Increasing right pleural effusion with increasing fluid in the fissure. Lucency about the right heart border, may reflect aerated lung adjacent to pleural fluid versus small pneumothorax. 2. Unchanged left pleural effusion. 3. Increasing bilateral perihilar atelectasis.   Electronically Signed   By: Jeb Levering M.D.   On: 09/29/2014 05:20   Dg Chest Port 1 View  09/28/2014   CLINICAL DATA:  Pleural effusion.  EXAM: PORTABLE CHEST - 1 VIEW  COMPARISON:  09/27/2014.  FINDINGS: Left IJ line noted in good anatomic position. Mediastinum hilar structures normal. Cardiomegaly with  normal pulmonary vascularity. Prior CABG. Interim slight improvement of bibasilar atelectasis and/or infiltrates. Interim slight improvement of bilateral pleural effusions. Persistent but improved questionable medial right base pneumothorax.  IMPRESSION: 1. Left IJ line in stable position. 2. Prior CABG.Stable cardiomegaly.  Pulmonary vascularity normal .  3. Slight improvement of bibasilar atelectasis and/or infiltrates. Slight improvement of bilateral pleural effusions. 4. Slight improvement of questionable medial right base pneumothorax.   Electronically Signed   By: Marcello Moores  Register   On: 09/28/2014 07:34   Dg Chest Port 1 View  09/27/2014   CLINICAL DATA:  RIGHT pneumothorax, hypertension, COPD  EXAM: PORTABLE CHEST - 1 VIEW  COMPARISON:  Portable exam 1858 hours compared to 1555 hours  FINDINGS: LEFT jugular central venous catheter with tip projecting over SVC.  Enlargement of cardiac silhouette post CABG.  Mediastinal contours stable with minimal atherosclerotic calcification aorta.  RIGHT basilar effusion and question loculated medial basilar pneumothorax unchanged.  Bibasilar atelectasis.  Tiny LEFT effusion.  No new infiltrate or acute osseous findings.  IMPRESSION: Bibasilar effusions and atelectasis with suspect small loculated medial basilar RIGHT pneumothorax, unchanged.   Electronically Signed   By: Lavonia Dana M.D.   On: 09/27/2014 19:24   Dg Chest Port 1 View  09/27/2014   CLINICAL DATA:  Status post right thoracentesis.  EXAM: PORTABLE CHEST - 1 VIEW  COMPARISON:  Single view of the chest earlier this same day.  FINDINGS: The patient's endotracheal tube and NG tube have been removed. Left IJ catheter remains in place.  Right pleural effusion is decreased in size after thoracentesis. There is a new lenticular lucency projecting over the right lower chest suspicious for a small anterior pneumothorax. Small left pleural effusion is again seen. There is no left pneumothorax. Bibasilar atelectasis  is noted.  IMPRESSION: Decreased right pleural effusion after thoracentesis with a likely small anterior pneumothorax identified.  No change in a small left pleural effusion and basilar atelectasis.  Status post extubation and NG tube removal.  Critical Value/emergent results were called by telephone at the time of interpretation on 09/27/2014 at 4:14 pm to Avon Gully, R.N., who verbally acknowledged these results.   Electronically Signed   By: Inge Rise M.D.   On: 09/27/2014 16:15   Dg Chest Port 1 View  09/27/2014   CLINICAL DATA:  SOB today; f/u right pleural effusion  EXAM: PORTABLE CHEST - 1 VIEW  COMPARISON:  the previous day's study  FINDINGS: Endotracheal tube, nasogastric tube, left IJ central line stable in position. Previous CABG. Stable mild cardiomegaly. Bilateral pleural effusions right greater than left unchanged. Patchy atelectasis or infiltrates in both lung bases persist. Mild central pulmonary vascular congestion.  IMPRESSION: 1. Stable pleural effusions and bilateral infiltrates or edema. 2.  Support hardware stable in position.   Electronically Signed   By: Lucrezia Europe M.D.   On: 09/27/2014 09:03   Dg Chest Port 1 View  09/26/2014   CLINICAL DATA:  Hospital admission for heart failure. Hypertension. COPD.  EXAM: PORTABLE CHEST - 1 VIEW  COMPARISON:  09/24/2014 and 09/22/2014  FINDINGS: Nasogastric tube courses into the stomach and off the inferior portion of the film as tip is not visualized. Endotracheal tube has tip approximately 3.5 cm above the carina. Left IJ central venous catheter is unchanged with tip over the SVC.  Lungs are adequately inflated with persistent moderate size right pleural effusion and small left pleural effusion. Slight worsening airspace opacification over the right mid to lower lung and stable opacification over the left mid to lower lung. Findings likely represent combination of atelectasis as well as vascular congestion, although cannot exclude  infection. Cardiomediastinal silhouette and remainder the exam is unchanged.  IMPRESSION: Slight worsening opacification over the right mid to lower lung with stable opacification of  the left mid to lower lung. Findings likely represent combination of atelectasis and vascular congestion although cannot exclude infection. Persistent moderate size right effusion and small left effusion.  Tubes and lines as described.   Electronically Signed   By: Marin Olp M.D.   On: 09/26/2014 09:57   Dg Chest Port 1 View  09/24/2014   CLINICAL DATA:  Endotracheal tube present.  EXAM: PORTABLE CHEST - 1 VIEW  COMPARISON:  Sep 23, 2014.  FINDINGS: Stable cardiomediastinal silhouette. Status post coronary artery bypass graft. Endotracheal tube is in grossly good position with distal tip 4 cm above the carina. Left internal jugular catheter is unchanged with distal tip overlying expected position of the SVC. Stable bilateral pleural effusions are noted with right greater than left. Stable bilateral lower lobe opacities are also noted. Bony thorax is intact.  IMPRESSION: Stable bilateral lung opacities and pleural effusions. Stable support apparatus.   Electronically Signed   By: Marijo Conception, M.D.   On: 09/24/2014 07:16   Dg Chest Port 1 View  09/23/2014   CLINICAL DATA:  Pneumonia  EXAM: PORTABLE CHEST - 1 VIEW  COMPARISON:  09/22/2014  FINDINGS: Bilateral pleural effusions are stable. Left midlung airspace disease is improved. Tubular device is stable. No pneumothorax.  IMPRESSION: Improved left midlung airspace disease.  Stable bilateral pleural effusions.   Electronically Signed   By: Marybelle Killings M.D.   On: 09/23/2014 08:12   Dg Chest Port 1 View  09/22/2014   CLINICAL DATA:  Central line placement  EXAM: PORTABLE CHEST - 1 VIEW  COMPARISON:  09/22/2014  FINDINGS: Endotracheal tube with tip measuring 4.5 cm above the carinal. Enteric tube tip is off the field of view. Left central venous catheter with tip over the  upper SVC region. No pneumothorax. Bilateral pleural effusions greater on the right with basilar atelectasis. Suggestion of developing airspace disease in the left mid lung possibly representing pneumonia. Calcified and tortuous aorta.  IMPRESSION: Appliances appear in satisfactory location. Bilateral pleural effusions, greater on the right. Suggestion of developing infiltration in the left mid lung.   Electronically Signed   By: Lucienne Capers M.D.   On: 09/22/2014 05:03   Dg Chest Port 1 View  09/22/2014   CLINICAL DATA:  Endotracheal tube placement  EXAM: PORTABLE CHEST - 1 VIEW  COMPARISON:  09/21/2014  FINDINGS: Interval placement of an endotracheal tube with tip measuring 4.1 cm above the carina. Postoperative changes in the mediastinum. Cardiac enlargement with mild pulmonary vascular congestion. Bilateral pleural effusions with basilar atelectasis. Mediastinal contours appear intact. Calcification of aorta.  IMPRESSION: Endotracheal tube appears in satisfactory position. Cardiac enlargement with mild pulmonary vascular congestion. Bilateral pleural effusions and basilar atelectasis.   Electronically Signed   By: Lucienne Capers M.D.   On: 09/22/2014 02:30   Dg Abd Acute W/chest  09/12/2014   CLINICAL DATA:  Constipation for 5 days. Bilateral leg swelling for 3 weeks. Abdominal pain. History of COPD and PE.  EXAM: DG ABDOMEN ACUTE W/ 1V CHEST  COMPARISON:  Chest 07/10/2014.  Abdomen 07/04/2014.  FINDINGS: Postoperative changes in the mediastinum. Mild cardiac enlargement without vascular congestion. Small bilateral pleural effusions, greater on the right. Bilateral basilar atelectasis. Emphysematous changes in the lungs. No pneumothorax. Calcification of the aorta.  Interval resolution of previous colonic ileus. Diffusely stool-filled colon. No small or large bowel distention. No free intra-abdominal air. No abnormal air-fluid levels. Vascular stents in vascular calcifications present. Degenerative  changes in the spine and hips.  IMPRESSION: Cardiac enlargement. Bilateral pleural effusions and basilar atelectasis, greater on the right. Normal nonobstructive bowel gas pattern.   Electronically Signed   By: Lucienne Capers M.D.   On: 09/12/2014 22:34   Dg Swallowing Func-speech Pathology  09/28/2014    Objective Swallowing Evaluation:    Patient Details  Name: GEORDIE NOONEY MRN: [id number] Date of Birth: [birth year]  Today's Date: 09/28/2014 Time: SLP Start Time (ACUTE ONLY): 1400-SLP Stop Time (ACUTE ONLY): 1425 SLP Time Calculation (min) (ACUTE ONLY): 25 min  Past Medical History:  Past Medical History  Diagnosis Date  . Aorto-iliac disease     a. s/p bilat with stent 2007 - followed by [facility].  . CAD (coronary artery disease)     a. 10/2011 s/p CABG x4 (LIMA-LAD, SVG-Diag, SVG-OM1 and SVG-PDA)  b.  03/2012 NSTEMI s/p LHC SVG-RCA and LIMA-LAD but total occlusion of SVG-OM  as well as SVG-diag. RX therapy recommended   . Hypertension   . ED (erectile dysfunction)   . COPD, severe   . Hyperlipidemia   . Stroke   . GERD (gastroesophageal reflux disease)   . Gout   . Paroxysmal atrial flutter     ablation  . Chronic combined systolic and diastolic CHF (congestive heart failure)     a. Last echo 04/2014: EF 40-45%.  Marland Kitchen PAF (paroxysmal atrial fibrillation)   . CKD (chronic kidney disease), stage III   . Tobacco abuse   . History of noncompliance with medical treatment   . Carotid artery disease     a. RCEA 2013 - followed by [facility].  . Anemia   . Lower GI bleed     a. LGIB 10/2012: Colonoscopy performed June 21 showed blood in the colon  but no definite source identified, diverticular versus AVM suspected.  Aspirin stopped.  . Mitral regurgitation   . Hypertensive heart disease   . PE (pulmonary embolism)    Past Surgical History:  Past Surgical History  Procedure Laterality Date  . Spinal fusion    . Hand surgery    . Neck fusion  1975  . Eye surgery    . Coronary artery bypass graft  10/31/2011    Procedure: CORONARY  ARTERY BYPASS GRAFTING (CABG);  Surgeon: Grace Isaac, MD;  Location: [place];  Service: Open Heart Surgery;  Laterality:  N/A;  times three using  Left Internal Mammary Artery and Right Greater  Saphenous Vein Graft harvested endoscopically; TEE  . Endarterectomy  10/31/2011    Procedure: ENDARTERECTOMY CAROTID;  Surgeon: Serafina Mitchell, MD;   Location[facility];  Service: Vascular;  Laterality: Right;  with patch  angioplasty   . Carotid endarterectomy  10-31-2011    right  . Colonoscopy N/A 10/30/2012    Procedure: COLONOSCOPY;  Surgeon: Lear Ng, MD;  Location: [facility]  ENDOSCOPY;  Service: Endoscopy;  Laterality: N/A;  . Left heart catheterization with coronary angiogram N/A 09/04/2011    Procedure: LEFT HEART CATHETERIZATION WITH CORONARY ANGIOGRAM;  Surgeon:  Sinclair Grooms, MD;  Location: [facility] CATH LAB;  Service: Cardiovascular;   Laterality: N/A;  . Left heart cath N/A 04/03/2012    Procedure: LEFT HEART CATH;  Surgeon: Troy Sine, MD;  Location: [facility]  CATH LAB;  Service: Cardiovascular;  Laterality: N/A;  . Pelvic fracture surgery     HPI:  Other Pertinent Information: 74 year old male admitted 09/25/2014 due to  colitis, HCAP, sepsis. Pt deteriorated and was intubated 09/22/14.  Extubated 09/27/14.  PMH significant for CVA, GERD, COPD. Orders received  for swallow evaluation.  No Data Recorded  Assessment / Plan / Recommendation CHL IP CLINICAL IMPRESSIONS 09/28/2014  Therapy Diagnosis (None)  Clinical Impression Pt demonstrates a severe oropharyngeal dysphagia with  silent penetration and aspiration of all consistencies tested. There is  reduced laryngeal closure during the swallow with all boluses reaching the  vocal folds. Hyolaryngeal excursion is particularly limited resulting in  poor opening of UES. Penetrate and residuals present post swallow are  silently penetrated/aspirated despite cues for multiple swallows and  throat clears. A chin tuck did slightly improve UES opening, but not  enough for  any texture to be safely consumed.  Suspect pt likely had a  structural dysphagia prior to admission given curvature of spine and  suspected impact on UES function.  Recommend pt remain NPO with laryngeal  closure exercises and ongoing PO trials. Consider short term method of  nutrition.       CHL IP TREATMENT RECOMMENDATION 09/28/2014  Treatment Recommendations Therapy as outlined in treatment plan below     CHL IP DIET RECOMMENDATION 09/28/2014  SLP Diet Recommendations Alternative means - temporary  Liquid Administration via (None)  Medication Administration Via alternative means  Compensations (None)  Postural Changes and/or Swallow Maneuvers (None)     CHL IP OTHER RECOMMENDATIONS 09/28/2014  Recommended Consults (None)  Oral Care Recommendations Oral care QID  Other Recommendations (None)     CHL IP FOLLOW UP RECOMMENDATIONS 06/15/2014  Follow up Recommendations None     CHL IP FREQUENCY AND DURATION 09/28/2014  Speech Therapy Frequency (ACUTE ONLY) min 3x week  Treatment Duration 2 weeks     Pertinent Vitals/Pain NA    SLP Swallow Goals CHL IP SWALLOW STUDY GOALS 11/14/2011  Patient will consume recommended diet without observed clinical signs of  aspiration with Supervision/safety  Swallow Study Goal #1 - Progress Not Met  Patient will utilize recommended strategies during swallow to increase  swallowing safety with Supervision/safety  Swallow Study Goal #2 - Progress Not met  Goal #3 (None)  Swallow Study Goal #3 - Progress (None)  Goal #4 (None)  Swallow Study Goal #4 - Progress (None)    No flowsheet data found.    CHL IP REASON FOR REFERRAL 09/28/2014  Reason for Referral Objectively evaluate swallowing function     CHL IP ORAL PHASE 09/28/2014  Lips (None)  Tongue (None)  Mucous membranes (None)  Nutritional status (None)  Other (None)  Oxygen therapy (None)  Oral Phase Impaired  Oral - Pudding Teaspoon (None)  Oral - Pudding Cup (None)  Oral - Honey Teaspoon (None)  Oral - Honey Cup (None)  Oral - Honey Syringe  (None)  Oral - Nectar Teaspoon (None)  Oral - Nectar Cup (None)  Oral - Nectar Straw (None)  Oral - Nectar Syringe (None)  Oral - Ice Chips (None)  Oral - Thin Teaspoon (None)  Oral - Thin Cup (None)  Oral - Thin Straw (None)  Oral - Thin Syringe (None)  Oral - Puree (None)  Oral - Mechanical Soft (None)  Oral - Regular (None)  Oral - Multi-consistency (None)  Oral - Pill (None)  Oral Phase - Comment (None)      CHL IP PHARYNGEAL PHASE 09/28/2014  Pharyngeal Phase Impaired  Pharyngeal - Pudding Teaspoon (None)  Penetration/Aspiration details (pudding teaspoon) (None)  Pharyngeal - Pudding Cup (None)  Penetration/Aspiration details (pudding cup) (None)  Pharyngeal - Honey Teaspoon (None)  Penetration/Aspiration details (honey teaspoon) (None)  Pharyngeal - Honey Cup (None)  Penetration/Aspiration details (honey cup) (None)  Pharyngeal - Honey Syringe (None)  Penetration/Aspiration details (honey syringe) (None)  Pharyngeal - Nectar Teaspoon (None)  Penetration/Aspiration details (nectar teaspoon) (None)  Pharyngeal - Nectar Cup (None)  Penetration/Aspiration details (nectar cup) (None)  Pharyngeal - Nectar Straw (None)  Penetration/Aspiration details (nectar straw) (None)  Pharyngeal - Nectar Syringe (None)  Penetration/Aspiration details (nectar syringe) (None)  Pharyngeal - Ice Chips (None)  Penetration/Aspiration details (ice chips) (None)  Pharyngeal - Thin Teaspoon (None)  Penetration/Aspiration details (thin teaspoon) (None)  Pharyngeal - Thin Cup (None)  Penetration/Aspiration details (thin cup) (None)  Pharyngeal - Thin Straw (None)  Penetration/Aspiration details (thin straw) (None)  Pharyngeal - Thin Syringe (None)  Penetration/Aspiration details (thin syringe') (None)  Pharyngeal - Puree (None)  Penetration/Aspiration details (puree) (None)  Pharyngeal - Mechanical Soft (None)  Penetration/Aspiration details (mechanical soft) (None)  Pharyngeal - Regular (None)  Penetration/Aspiration details (regular)  (None)  Pharyngeal - Multi-consistency (None)  Penetration/Aspiration details (multi-consistency) (None)  Pharyngeal - Pill (None)  Penetration/Aspiration details (pill) (None)  Pharyngeal Comment (None)      CHL IP CERVICAL ESOPHAGEAL PHASE 09/28/2014  Cervical Esophageal Phase Impaired  Pudding Teaspoon (None)  Pudding Cup (None)  Honey Teaspoon (None)  Honey Cup (None)  Honey Straw (None)  Nectar Teaspoon (None)  Nectar Cup (None)  Nectar Straw (None)  Nectar Sippy Cup (None)  Thin Teaspoon (None)  Thin Cup (None)  Thin Straw (None)  Thin Sippy Cup (None)  Cervical Esophageal Comment decreased opening of cervical esophagus.     No flowsheet data found.         DeBlois, Katherene Ponto 09/28/2014, 2:50 PM       ASSESSMENT AND PLAN: MUHANAD TOROSYAN is a 74 y.o. male with a history of chronic combine S/D CHF, CAD s/p CABG x4 (2013), COPD, CKD, HTN, PVD s/p bilat aorto-iliac stenting (2007), carotid artery disease s/p RCEA (2013), CVA, PAF s/p ablation on coumadin, previous LGIB (2014), recent PE after hip fracture and mod MR who presented to [facility] on 10/07/2014 w/ constipation and SOB and found to have sepsis 2/2 HCAP and colitis. Patient also noted to have severe coagulopathy with INR >10, acute renal failure and hypotension. He had been improving until 5/12 early am with rapid deterioration with obtundation, bradycardia, and hypoxia (50% O2 sat). Intubated till 05/18. 2D ECHO revealed new severe MR and recommended TEE to further investigate. Cardiology consulted for new valvular disease and further testing.   Severe MR- 2D ECHO 06/2014 with EF 40% with diffuse HK and mod MR. Limited 2D ECHO this admission with stable LVEF but now severe MR and suggesting TEE to further investigate. Also 2D ECHO suggestive of RV pressure/volume overload, mod TR and mild pulm HTN.  -- Dr Tamala Julian, his primary cardiologist believes he has significant MR based upon exam and prior echo studies. MR is likely related to papillary muscle  dysfunction in this patient with h/o prior inferior MI. AF with RVR is not helping the overall situation. BB therapy had initially been withdrawn due to hypotension (since resumed). He would not be a candidate for repeat open heart surgery.  AF RVR- Initially loaded with digoxin, now on Coreg and amiodarone with plans for TEE/cardioversion prior to discharge as management of heart failure will be easier with sinus rhythm.  -- Started on low dose of BB: Coreg 3.125 mg bid --on heparin infusion.  NSVT- 7 beat run of NSVT, K low at 3.2. Continue  Coreg. Will give supplemental KCl.  Acute hypoxemic respiratory failure - per CCM, extubated 05/18  Shock - septic vs cardiogenic-->shock resolved. Now off pressors.   Acute on chronic systolic/diastolic CHF - echo 0/35/46: ef 45% - Lasix gtt d/c'd 05/18. Given IV Lasix on 5/20 with over 2L output in last 24 hours. - CXR 09/28/14 with 1. Increasing right pleural effusion with increasing fluid in the fissure. Lucency about the right heart border, may reflect aerated lung adjacent to pleural fluid versus small pneumothorax. 2. Unchanged left pleural effusion. 3. Increasing bilateral perihilar atelectasis. - Possible medial rt ptx, suspect that this is ex vacuo following R thoracentesis on 5/18 (transudate) - Volume status per CCM  Acute on CKD--> creat improved: peak 3.97 (05/12) 3.37--> 2.9 --> 2.8--> 2.6-->2.42 --> 2.15-->2.16 (05/21)  Coumadin coagulopathy - Hold coumadin. INR 1.51 today. Pharmacy following INR will need to eventually go back on coumadin (5/19 plts 75, 98 today) - has been sub-therapeutic since 05/14. Plans for TEE prior to cardioversion at some point prior to discharge. --On heparin infusion at present being dosed by pharmacy.  Thrombocytopenia: Plt up to 98 today, HIT panel negative.  Acute metabolic encephalopathy/Dementia- Reportedly worsened recently per son. Mental status had reportedly improved significantly on 5/20, and  alert today.    Kate Sable, M.D., F.A.C.C.

## 2014-09-30 NOTE — Progress Notes (Signed)
PHARMACY - HEPARIN  IV heparin infusing @ 1750 units/hr Heparin level = 0.27 (goal 0.3-0.7) No complications of therapy noted  Assessment:  Heparin level increasing but still < goal  Plan:  Increase IV heparin to 1950 units/hr            Check heparin level 8 hr after heparin increased  Terrilee FilesLeann Puanani Gene, PharmD 09/30/14 @ 22:05

## 2014-09-30 NOTE — Progress Notes (Signed)
Speech Language Pathology Treatment: Dysphagia  Patient Details Name: Nathan KellMaurice W [name] MRN: [id number] DOB: 03/01/1941 Today's Date: 09/30/2014 Time: 1133-1203 SLP Time Calculation (min) (ACUTE ONLY): 30 min  Assessment / Plan / Recommendation Clinical Impression  Treatment focused on therapeutic exercises targeting laryngeal elevation and UES opening with sister at bedside. Pt recalled exercises from yesterday's session without cues however required mild-moderate verbal/visual cueing during implementation of superglottic swallow technique. Intermittent wet vocal quality and reflexive coughs present. SLP continued to provide clinical reasoning/education re: baseline dysphagia in relation to current illness and expectation/prognosis. Introduced vocal modulation exercises for a high pitched /e/ and hard glottal stops to facilitate laryngeal elevation and vocal cord adduction/protection. Encouraged pt to perform exercises during the day.   HPI Other Pertinent Information: 74 year old male admitted 10/04/2014 due to colitis, HCAP, sepsis. Pt deteriorated and was intubated 09/22/14. Extubated 09/27/14.  PMH significant for CVA, GERD, COPD. Orders received for swallow evaluation.   Pertinent Vitals Pain Assessment: No/denies pain  SLP Plan  Continue with current plan of care    Recommendations Diet recommendations: NPO (except for ice chips after oral care, exercises) Medication Administration: Via alternative means              Oral Care Recommendations:  (as appropriate) Follow up Recommendations:  (TBD) Plan: Continue with current plan of care    GO     Royce MacadamiaLitaker, Blong Busk Willis 09/30/2014, 12:17 PM   Breck CoonsLisa Willis Lonell FaceLitaker M.Ed [employer] Pager [phone]

## 2014-09-30 NOTE — Progress Notes (Signed)
ANTICOAGULATION CONSULT NOTE - follow up  Pharmacy Consult for Heparin Indication: atrial fibrillation  No Known Allergies  Patient Measurements: Height:  (185.4 cm) Weight: 192 lb 14.4 oz (87.5 kg) IBW/kg (Calculated) : 79.9 Heparin Dosing Weight: 91.5  Vital Signs: Temp: 98 F (36.7 C) (05/21 0523) Temp Source: Oral (05/21 0523) BP: 139/81 mmHg (05/21 0523) Pulse Rate: 56 (05/21 0523)  Labs:  Recent Labs  09/28/14 0440 09/29/14 0431 09/30/14 0133 09/30/14 1156  HGB 9.7* 8.9* 9.2*  --   HCT 33.9* 31.0* 32.5*  --   PLT 75* 76* 98*  --   APTT  --  39*  --   --   LABPROT 18.4* 18.0* 18.3*  --   INR 1.51* 1.48 1.51*  --   HEPARINUNFRC  --   --  0.10* 0.20*  CREATININE 2.42* 2.15* 2.16*  --     Estimated Creatinine Clearance: 33.4 mL/min (by C-G formula based on Cr of 2.16).   Medical History: Past Medical History  Diagnosis Date  . Aorto-iliac disease     a. s/p bilat with stent 2007 - followed by [facility].  . CAD (coronary artery disease)     a. 10/2011 s/p CABG x4 (LIMA-LAD, SVG-Diag, SVG-OM1 and SVG-PDA)  b. 03/2012 NSTEMI s/p LHC SVG-RCA and LIMA-LAD but total occlusion of SVG-OM as well as SVG-diag. RX therapy recommended   . Hypertension   . ED (erectile dysfunction)   . COPD, severe   . Hyperlipidemia   . Stroke   . GERD (gastroesophageal reflux disease)   . Gout   . Paroxysmal atrial flutter     ablation  . Chronic combined systolic and diastolic CHF (congestive heart failure)     a. Last echo 04/2014: EF 40-45%.  Marland Kitchen PAF (paroxysmal atrial fibrillation)   . CKD (chronic kidney disease), stage III   . Tobacco abuse   . History of noncompliance with medical treatment   . Carotid artery disease     a. RCEA 2013 - followed by [facility].  . Anemia   . Lower GI bleed     a. LGIB 10/2012: Colonoscopy performed June 21 showed blood in the colon but no definite source identified, diverticular versus AVM suspected. Aspirin stopped.  . Mitral regurgitation   .  Hypertensive heart disease   . PE (pulmonary embolism)     Medications:  Scheduled:  . amiodarone  200 mg Oral BID  . antiseptic oral rinse  7 mL Mouth Rinse QID  . atorvastatin  10 mg Oral q1800  . budesonide (PULMICORT) nebulizer solution  0.5 mg Nebulization BID  . carvedilol  3.125 mg Per Tube BID WC  . chlorhexidine  15 mL Mouth Rinse BID  . free water  240 mL Per Tube 6 times per day  . Gerhardt's butt cream   Topical TID  . ipratropium-albuterol  3 mL Nebulization Q6H  . lipase/protease/amylase  2 capsule Oral Once   And  . sodium bicarbonate  650 mg Oral Once  . magnesium oxide  400 mg Per Tube Daily  . potassium chloride  60 mEq Per Tube Once  . senna  1 tablet Oral Daily  . sodium chloride  3 mL Intravenous Q12H   Infusions:  . feeding supplement (VITAL AF 1.2 CAL) 1,000 mL (09/29/14 1719)  . heparin 1,550 Units/hr (09/30/14 1013)    Assessment: 31 yoM w/ PMH AFib, recent PE, COPD, CKD3, HFpEF admitted 5/9 with colitis and pneumosepsis from HCAP.  Complicated hospital course requiring  mechanical ventilation and pressor support.  Noted thrombocytopenia starting 5/15 and HIT panel ordered, which resulted negative.  Remains in Afib with rate controlled by BBs and digoxin.  Cardiology started amiodarone today without loading dose, stopped digoxin.  To resume anticoagulation with heparin per CCM.   INR supratherapeutic on admission, received multiple doses Vit K during admission; currently subtherapeutic  APTT: wnl (subtherapeutic)  Prior anticoagulation: warfarin with recent PE following hip surgery  CrCl 33.5 ml/min  Significant events: 5/18 HIT panel negative  Today, 09/30/2014:  HL 0.2 (subtherapeutic) on 1550 Units/hr.  No bleeding/issues per RN  Goal of Therapy: Heparin level 0.3-0.7 units/ml Monitor platelets by anticoagulation protocol: Yes  Plan:  No heparin bolus  Increase Heparin to 1750 units/hr IV infusion  Check heparin level 8 hrs after  change  Daily CBC, daily heparin level once stable  Monitor for signs of bleeding or thrombosis  Follow-up plans for TEE/cardioversion and resuming PO anticoagulation   Arley Phenixllen Lucile Didonato RPh 09/30/2014, 1:02 PM Pager [phone]

## 2014-09-30 NOTE — Progress Notes (Signed)
eLink Physician-Brief Progress Note Patient Name: Nathan [name] DOB: 10/11/1940 MRN: [id number]   Date of Service  09/30/2014  HPI/Events of Note  Called d/t multiple issues: 1. Burning in legs. 2. Patient needs sleep aide and 3. "how do you want to give scheduled K+".   eICU Interventions  Will order: 1. Benadryl elixir 12.5 mg via tube for sleep. 2. Local care for burning in legs.  3. Recheck BMP prior to further K+ replacement.      Intervention Category Intermediate Interventions: Electrolyte abnormality - evaluation and management  Sommer,Steven Eugene 09/30/2014, 12:28 AM

## 2014-10-01 DIAGNOSIS — A408 Other streptococcal sepsis: Secondary | ICD-10-CM

## 2014-10-01 DIAGNOSIS — I481 Persistent atrial fibrillation: Secondary | ICD-10-CM

## 2014-10-01 DIAGNOSIS — R1084 Generalized abdominal pain: Secondary | ICD-10-CM

## 2014-10-01 DIAGNOSIS — R1314 Dysphagia, pharyngoesophageal phase: Secondary | ICD-10-CM | POA: Insufficient documentation

## 2014-10-01 DIAGNOSIS — N179 Acute kidney failure, unspecified: Secondary | ICD-10-CM | POA: Insufficient documentation

## 2014-10-01 LAB — GLUCOSE, CAPILLARY
GLUCOSE-CAPILLARY: 109 mg/dL — AB (ref 65–99)
GLUCOSE-CAPILLARY: 103 mg/dL — AB (ref 65–99)
GLUCOSE-CAPILLARY: 104 mg/dL — AB (ref 65–99)
GLUCOSE-CAPILLARY: 113 mg/dL — AB (ref 65–99)
Glucose-Capillary: 105 mg/dL — ABNORMAL HIGH (ref 65–99)
Glucose-Capillary: 109 mg/dL — ABNORMAL HIGH (ref 65–99)
Glucose-Capillary: 114 mg/dL — ABNORMAL HIGH (ref 65–99)

## 2014-10-01 LAB — BASIC METABOLIC PANEL
ANION GAP: 9 (ref 5–15)
BUN: 69 mg/dL — AB (ref 6–20)
CHLORIDE: 116 mmol/L — AB (ref 101–111)
CO2: 27 mmol/L (ref 22–32)
Calcium: 8.5 mg/dL — ABNORMAL LOW (ref 8.9–10.3)
Creatinine, Ser: 1.95 mg/dL — ABNORMAL HIGH (ref 0.61–1.24)
GFR calc Af Amer: 37 mL/min — ABNORMAL LOW (ref >60)
GFR calc non Af Amer: 32 mL/min — ABNORMAL LOW (ref >60)
Glucose, Bld: 116 mg/dL — ABNORMAL HIGH (ref 65–99)
Potassium: 3.8 mmol/L (ref 3.5–5.1)
Sodium: 152 mmol/L — ABNORMAL HIGH (ref 135–145)

## 2014-10-01 LAB — PROTIME-INR
INR: 1.45 (ref 0.00–1.49)
PROTHROMBIN TIME: 17.7 s — AB (ref 11.6–15.2)

## 2014-10-01 LAB — CBC
HCT: 29.8 % — ABNORMAL LOW (ref 39.0–52.0)
Hemoglobin: 8.4 g/dL — ABNORMAL LOW (ref 13.0–17.0)
MCH: 24.6 pg — AB (ref 26.0–34.0)
MCHC: 28.2 g/dL — ABNORMAL LOW (ref 30.0–36.0)
MCV: 87.4 fL (ref 78.0–100.0)
PLATELETS: 80 10*3/uL — AB (ref 150–400)
RBC: 3.41 MIL/uL — AB (ref 4.22–5.81)
RDW: 22.7 % — AB (ref 11.5–15.5)
WBC: 6.3 10*3/uL (ref 4.0–10.5)

## 2014-10-01 LAB — BODY FLUID CULTURE: Culture: NO GROWTH

## 2014-10-01 LAB — HEPARIN LEVEL (UNFRACTIONATED)
HEPARIN UNFRACTIONATED: 0.52 [IU]/mL (ref 0.30–0.70)
Heparin Unfractionated: 0.56 IU/mL (ref 0.30–0.70)

## 2014-10-01 MED ORDER — FUROSEMIDE 10 MG/ML IJ SOLN
40.0000 mg | Freq: Once | INTRAMUSCULAR | Status: AC
  Administered 2014-10-01: 40 mg via INTRAVENOUS
  Filled 2014-10-01: qty 4

## 2014-10-01 MED ORDER — FREE WATER
300.0000 mL | Status: DC
  Administered 2014-10-01 – 2014-10-03 (×10): 300 mL

## 2014-10-01 MED ORDER — POTASSIUM CHLORIDE 20 MEQ/15ML (10%) PO SOLN
60.0000 meq | Freq: Once | ORAL | Status: AC
  Administered 2014-10-01: 60 meq
  Filled 2014-10-01: qty 45

## 2014-10-01 NOTE — Progress Notes (Signed)
ANTICOAGULATION CONSULT NOTE - follow up  Pharmacy Consult for Heparin Indication: atrial fibrillation  No Known Allergies  Patient Measurements: Height: 6\' 1"  (185.4 cm) Weight: 193 lb 3.2 oz (87.635 kg) IBW/kg (Calculated) : 79.9 Heparin Dosing Weight: 91.5  Vital Signs: Temp: 97.6 F (36.4 C) (05/22 0515) Temp Source: Oral (05/22 0515) BP: 137/65 mmHg (05/22 0515) Pulse Rate: 94 (05/22 0515)  Labs:  Recent Labs  09/29/14 0431  09/30/14 0133 09/30/14 1156 09/30/14 2050 10/01/14 0544  HGB 8.9*  --  9.2*  --   --  8.4*  HCT 31.0*  --  32.5*  --   --  29.8*  PLT 76*  --  98*  --   --  80*  APTT 39*  --   --   --   --   --   LABPROT 18.0*  --  18.3*  --   --  17.7*  INR 1.48  --  1.51*  --   --  1.45  HEPARINUNFRC  --   < > 0.10* 0.20* 0.27* 0.56  CREATININE 2.15*  --  2.16*  --   --  1.95*  < > = values in this interval not displayed.  Estimated Creatinine Clearance: 37 mL/min (by C-G formula based on Cr of 1.95).   Medical History: Past Medical History  Diagnosis Date  . Aorto-iliac disease     a. s/p bilat with stent 2007 - followed by [facility].  . CAD (coronary artery disease)     a. 10/2011 s/p CABG x4 (LIMA-LAD, SVG-Diag, SVG-OM1 and SVG-PDA)  b. 03/2012 NSTEMI s/p LHC SVG-RCA and LIMA-LAD but total occlusion of SVG-OM as well as SVG-diag. RX therapy recommended   . Hypertension   . ED (erectile dysfunction)   . COPD, severe   . Hyperlipidemia   . Stroke   . GERD (gastroesophageal reflux disease)   . Gout   . Paroxysmal atrial flutter     ablation  . Chronic combined systolic and diastolic CHF (congestive heart failure)     a. Last echo 04/2014: EF 40-45%.  Marland Kitchen. PAF (paroxysmal atrial fibrillation)   . CKD (chronic kidney disease), stage III   . Tobacco abuse   . History of noncompliance with medical treatment   . Carotid artery disease     a. RCEA 2013 - followed by [facility].  . Anemia   . Lower GI bleed     a. LGIB 10/2012: Colonoscopy performed June 21  showed blood in the colon but no definite source identified, diverticular versus AVM suspected. Aspirin stopped.  . Mitral regurgitation   . Hypertensive heart disease   . PE (pulmonary embolism)     Medications:  Scheduled:  . amiodarone  200 mg Oral BID  . antiseptic oral rinse  7 mL Mouth Rinse QID  . atorvastatin  10 mg Oral q1800  . budesonide (PULMICORT) nebulizer solution  0.5 mg Nebulization BID  . carvedilol  3.125 mg Per Tube BID WC  . chlorhexidine  15 mL Mouth Rinse BID  . free water  240 mL Per Tube 6 times per day  . Gerhardt's butt cream   Topical TID  . ipratropium-albuterol  3 mL Nebulization TID  . lipase/protease/amylase  2 capsule Oral Once   And  . sodium bicarbonate  650 mg Oral Once  . magnesium oxide  400 mg Per Tube Daily  . senna  1 tablet Oral Daily  . sodium chloride  3 mL Intravenous Q12H   Infusions:  .  feeding supplement (VITAL AF 1.2 CAL) 1,000 mL (10/01/14 0541)  . heparin 1,950 Units/hr (09/30/14 2312)    Assessment: 1 yoM w/ PMH AFib, recent PE, COPD, CKD3, HFpEF admitted 5/9 with colitis and pneumosepsis from HCAP.  Complicated hospital course requiring mechanical ventilation and pressor support.  Noted thrombocytopenia starting 5/15 and HIT panel ordered, which resulted negative.  Remains in Afib with rate controlled by BBs and digoxin.  Cardiology started amiodarone today without loading dose, stopped digoxin.  To resume anticoagulation with heparin per CCM.   INR supratherapeutic on admission, received multiple doses Vit K during admission; currently subtherapeutic  APTT: wnl (subtherapeutic)  Prior anticoagulation: warfarin with recent PE following hip surgery  CrCl 33.5 ml/min  Significant events: 5/18 HIT panel negative  Today, 10/01/2014:  HL 0.56 (therapeutic) on 1950 Units/hr.  No bleeding/issues per RN  Goal of Therapy: Heparin level 0.3-0.7 units/ml Monitor platelets by anticoagulation protocol:  Yes  Plan:  Continue Heparin at 1950 units/hr IV infusion  Check heparin level 8 hrs (confirmation)  Daily CBC, daily heparin level once stable  Monitor for signs of bleeding or thrombosis  Follow-up plans for TEE/cardioversion and resuming PO anticoagulation   Arley Phenix RPh 10/01/2014, 7:11 AM Pager [phone]

## 2014-10-01 NOTE — Progress Notes (Signed)
Patient Profile: 74 y.o. male with hx chronic S/D CHF, CABG x4 (2013), COPD, CKD, HTN, PVD s/p bilat aorto-iliac stenting (2007), RCEA (2013), CVA, PAF s/p ablation on coumadin, LGIB (2014), PE & hip fx 06/2014, and mod MR admitted 09/22/2014 w/ constipation, SOB, sepsis 2/2 HCAP and colitis, severe coagulopathy with INR >10, ARF and hypotension. Intubated 05/12>05/18, effusions, s/p tap 05/18 1000 cc out, but had small PTX.   SUBJECTIVE: NG tube since reinserted after pt pulled yesterday morning. Says he feels well and says breathing has again improved. Denies chest pain.     Intake/Output Summary (Last 24 hours) at 10/01/14 0855 Last data filed at 10/01/14 0700  Gross per 24 hour  Intake 3987.96 ml  Output   2000 ml  Net 1987.96 ml    Current Facility-Administered Medications  Medication Dose Route Frequency Provider Last Rate Last Dose  . amiodarone (PACERONE) tablet 200 mg  200 mg Oral BID Belva Crome, MD   200 mg at 09/30/14 2312  . antiseptic oral rinse (CPC / CETYLPYRIDINIUM CHLORIDE 0.05%) solution 7 mL  7 mL Mouth Rinse QID Chesley Mires, MD   7 mL at 10/01/14 0400  . atorvastatin (LIPITOR) tablet 10 mg  10 mg Oral q1800 Chesley Mires, MD   10 mg at 09/30/14 1718  . budesonide (PULMICORT) nebulizer solution 0.5 mg  0.5 mg Nebulization BID Gershon Mussel, RRT   0.5 mg at 09/30/14 1927  . carvedilol (COREG) tablet 3.125 mg  3.125 mg Per Tube BID WC Rhonda G Barrett, PA-C   3.125 mg at 09/30/14 1718  . chlorhexidine (PERIDEX) 0.12 % solution 15 mL  15 mL Mouth Rinse BID Chesley Mires, MD   15 mL at 10/01/14 0514  . feeding supplement (VITAL AF 1.2 CAL) liquid 1,000 mL  1,000 mL Per Tube Continuous Clayton Bibles, RD 65 mL/hr at 10/01/14 0541 1,000 mL at 10/01/14 0541  . free water 300 mL  300 mL Per Tube 6 times per day Nishant Dhungel, MD      . Gerhardt's butt cream   Topical TID Brand Males, MD      . heparin ADULT infusion 100 units/mL (25000 units/250 mL)  1,950 Units/hr  Intravenous Continuous Leann T Poindexter, RPH 19.5 mL/hr at 09/30/14 2312 1,950 Units/hr at 09/30/14 2312  . ipratropium-albuterol (DUONEB) 0.5-2.5 (3) MG/3ML nebulizer solution 3 mL  3 mL Nebulization TID Nishant Dhungel, MD      . levalbuterol (XOPENEX) nebulizer solution 0.63 mg  0.63 mg Nebulization Q3H PRN Corey Harold, NP      . lipase/protease/amylase (CREON) capsule 24,000 Units  2 capsule Oral Once Collene Gobble, MD       And  . sodium bicarbonate tablet 650 mg  650 mg Oral Once Collene Gobble, MD      . magnesium oxide (MAG-OX) tablet 400 mg  400 mg Per Tube Daily Erick Colace, NP   400 mg at 09/30/14 1342  . senna (SENOKOT) tablet 8.6 mg  1 tablet Oral Daily Waldemar Dickens, MD   8.6 mg at 09/27/14 7858  . sodium chloride 0.9 % injection 10-40 mL  10-40 mL Intracatheter PRN Collene Gobble, MD   20 mL at 09/30/14 1423  . sodium chloride 0.9 % injection 3 mL  3 mL Intravenous Q12H Waldemar Dickens, MD   3 mL at 09/30/14 2311    Filed Vitals:   09/30/14 1500 09/30/14 1927 09/30/14 2009 10/01/14 0515  BP:  136/78  123/67 137/65  Pulse: 84  90 94  Temp: 98.1 F (36.7 C)  98.1 F (36.7 C) 97.6 F (36.4 C)  TempSrc: Oral  Oral Oral  Resp: _0 Height:      Weight:    193 lb 3.2 oz (87.635 kg)  SpO2: 98% 96% 97% 100%    PHYSICAL EXAM General: NAD HEENT: Normal. Neck: +JVD.  Lungs: Diminished sounds at bases b/l, b/l rhonchi. CV: Irregular rhythm, normal S1/S2, no S3, 3/6 apical holosystolic murmur. 1+ pretibial edema. Abdomen: Firm, +distention.  Neurologic: Alert and oriented.  Psych: Normal affect. Musculoskeletal: No gross deformities.  TELEMETRY: Atrial fibrillation with PVC's.  LABS: Basic Metabolic Panel:  Recent Labs  09/29/14 0431 09/30/14 0133 10/01/14 0544  NA 154* 153* 152*  K 3.5 3.2* 3.8  CL 117* 116* 116*  CO2 _1 GLUCOSE 101* 86 116*  BUN 77* 67* 69*  CREATININE 2.15* 2.16* 1.95*  CALCIUM 8.7* 8.9 8.5*  PHOS 4.1  --   --      Liver Function Tests:  Recent Labs  09/30/14 0133  AST 31  ALT 66*  ALKPHOS 85  BILITOT 1.8*  PROT 6.3*  ALBUMIN 2.2*   No results for input(s): LIPASE, AMYLASE in the last 72 hours. CBC:  Recent Labs  09/30/14 0133 10/01/14 0544  WBC 5.1 6.3  HGB 9.2* 8.4*  HCT 32.5* 29.8*  MCV 86.4 87.4  PLT 98* 80*   Cardiac Enzymes: No results for input(s): CKTOTAL, CKMB, CKMBINDEX, TROPONINI in the last 72 hours. BNP: Invalid input(s): POCBNP D-Dimer: No results for input(s): DDIMER in the last 72 hours. Hemoglobin A1C: No results for input(s): HGBA1C in the last 72 hours. Fasting Lipid Panel: No results for input(s): CHOL, HDL, LDLCALC, TRIG, CHOLHDL, LDLDIRECT in the last 72 hours. Thyroid Function Tests: No results for input(s): TSH, T4TOTAL, T3FREE, THYROIDAB in the last 72 hours.  Invalid input(s): FREET3 Anemia Panel: No results for input(s): VITAMINB12, FOLATE, FERRITIN, TIBC, IRON, RETICCTPCT in the last 72 hours.  RADIOLOGY: Ct Abdomen Pelvis Wo Contrast  09/23/2014   CLINICAL DATA:  Constipation for 5 days and bilateral leg swelling for 3 weeks. History of COPD and PE. No IV or oral contrast material per order of the referring physician.  EXAM: CT ABDOMEN AND PELVIS WITHOUT CONTRAST  TECHNIQUE: Multidetector CT imaging of the abdomen and pelvis was performed following the standard protocol without IV contrast.  COMPARISON:  None.  FINDINGS: Small bilateral pleural effusions, greater on the right. Basilar atelectasis. Consolidation in the right lower lung posteriorly probably represents pneumonia. Postoperative changes in the mediastinum. Mild cardiac enlargement.  Small amount of free fluid in the abdomen and extending along the pericolic gutters into the pelvis. This is likely ascites. Increased density in the gallbladder may represent sludge or milk of calcium. No bile duct dilatation. Unenhanced appearance of the liver, spleen, pancreas, adrenal glands, inferior vena  cava, and retroperitoneal lymph nodes is unremarkable. Calcifications in both kidneys probably representing vascular calcification. No hydronephrosis or hydroureter. Calcification throughout the abdominal aorta without aneurysm. Stenosis at the aortic bifurcation is probable. Vascular stents in the iliac arteries. Stomach, small bowel, and colon are not abnormally distended. Stool fills the colon. No free air in the abdomen.  Pelvis: Stool-filled rectosigmoid colon. There is some stranding around the sigmoid region which may indicate early changes of acute diverticulitis. No abscess. Bladder wall is not thickened. Prostate gland is not enlarged. Appendix is normal. Small  left inguinal hernia containing fat and fluid. Degenerative changes in the spine and hips. Old fracture deformity of the right inferior pubic ramus.  IMPRESSION: Small bilateral pleural effusions greater on the right. Consolidation in the right lower lung suggesting pneumonia. Small amount of abdominal and pelvic free fluid. Increased density suggesting sludge versus milk of calcium in the gallbladder. Mild stranding around the sigmoid colon suggesting early diverticulitis. No abscess. Left inguinal hernia containing fat and fluid.   Electronically Signed   By: Lucienne Capers M.D.   On: 10/08/2014 23:34   Dg Abd 1 View  09/30/2014   CLINICAL DATA:  Encounter for nasogastric tube placement.  EXAM: ABDOMEN - 1 VIEW  COMPARISON:  One day prior at 1038 hour  FINDINGS: Two portable AP supine views submitted. Initial image demonstrates weighted feeding tube tip in the mid chest in the region of the mid-esophagus. Subsequent image demonstrates advancement of the feeding tube with tip below the diaphragm in the stomach. There is contrast within the colon from prior modified barium swallow.  IMPRESSION: Tip of the weighted enteric tube in the stomach after advancement.   Electronically Signed   By: Jeb Levering M.D.   On: 09/30/2014 01:28   Dg Abd  1 View  09/29/2014   CLINICAL DATA:  Feeding tube placement  EXAM: ABDOMEN - 1 VIEW  COMPARISON:  Portable exam 1038 hours compared to 10/08/2014  FINDINGS: Tip of feeding tube at gastric fundus.  Nonobstructive bowel gas pattern.  Small amount of contrast retained in distal ileum.  Air-filled colon, upper normal caliber.  RIGHT pleural effusion and LEFT lower lobe infiltrate identified.  IMPRESSION: Tip feeding tube coiled in proximal stomach with tip at fundus.   Electronically Signed   By: Lavonia Dana M.D.   On: 09/29/2014 11:03   Dg Chest Port 1 View  09/30/2014   CLINICAL DATA:  Followup right pleural effusion.  EXAM: PORTABLE CHEST - 1 VIEW  COMPARISON:  Yesterday.  FINDINGS: No significant change in a small to moderate size right pleural effusion and right basilar airspace opacity. Mild increase in size of a small left pleural effusion. Stable enlarged cardiac silhouette. Mild diffuse increase in prominence of the interstitial markings. Stable post CABG changes. Feeding tube extending into the stomach. Left jugular catheter tip in the superior vena cava. No pneumothorax.  IMPRESSION: 1. Mildly increased interstitial pulmonary edema. 2. Mild increased small left pleural effusion. 3. Stable small to moderate-sized right pleural effusion and associated right basilar probable atelectasis. 4. Stable cardiomegaly.   Electronically Signed   By: Claudie Revering M.D.   On: 09/30/2014 09:27   Dg Chest Port 1 View  09/29/2014   CLINICAL DATA:  Respiratory failure, effusions.  EXAM: PORTABLE CHEST - 1 VIEW  COMPARISON:  Yesterday at 0543 hour  FINDINGS: Tip of the left central line remains in the SVC. Cardiomegaly is unchanged. Increased right pleural effusion with increasing fluid in the fissure. Lucency adjacent to the right heart border may be related to aerated lung adjacent to pleural effusions versus pneumothorax, this has minimally progressed from prior. Left pleural effusion is unchanged. Bibasilar opacities  are likely related to compressive atelectasis. Pulmonary vasculature is normal. There is increasing bilateral perihilar atelectasis.  IMPRESSION: 1. Increasing right pleural effusion with increasing fluid in the fissure. Lucency about the right heart border, may reflect aerated lung adjacent to pleural fluid versus small pneumothorax. 2. Unchanged left pleural effusion. 3. Increasing bilateral perihilar atelectasis.   Electronically Signed  By: Jeb Levering M.D.   On: 09/29/2014 05:20   Dg Chest Port 1 View  09/28/2014   CLINICAL DATA:  Pleural effusion.  EXAM: PORTABLE CHEST - 1 VIEW  COMPARISON:  09/27/2014.  FINDINGS: Left IJ line noted in good anatomic position. Mediastinum hilar structures normal. Cardiomegaly with normal pulmonary vascularity. Prior CABG. Interim slight improvement of bibasilar atelectasis and/or infiltrates. Interim slight improvement of bilateral pleural effusions. Persistent but improved questionable medial right base pneumothorax.  IMPRESSION: 1. Left IJ line in stable position. 2. Prior CABG.Stable cardiomegaly.  Pulmonary vascularity normal . 3. Slight improvement of bibasilar atelectasis and/or infiltrates. Slight improvement of bilateral pleural effusions. 4. Slight improvement of questionable medial right base pneumothorax.   Electronically Signed   By: Marcello Moores  Register   On: 09/28/2014 07:34   Dg Chest Port 1 View  09/27/2014   CLINICAL DATA:  RIGHT pneumothorax, hypertension, COPD  EXAM: PORTABLE CHEST - 1 VIEW  COMPARISON:  Portable exam 1858 hours compared to 1555 hours  FINDINGS: LEFT jugular central venous catheter with tip projecting over SVC.  Enlargement of cardiac silhouette post CABG.  Mediastinal contours stable with minimal atherosclerotic calcification aorta.  RIGHT basilar effusion and question loculated medial basilar pneumothorax unchanged.  Bibasilar atelectasis.  Tiny LEFT effusion.  No new infiltrate or acute osseous findings.  IMPRESSION: Bibasilar  effusions and atelectasis with suspect small loculated medial basilar RIGHT pneumothorax, unchanged.   Electronically Signed   By: Lavonia Dana M.D.   On: 09/27/2014 19:24   Dg Chest Port 1 View  09/27/2014   CLINICAL DATA:  Status post right thoracentesis.  EXAM: PORTABLE CHEST - 1 VIEW  COMPARISON:  Single view of the chest earlier this same day.  FINDINGS: The patient's endotracheal tube and NG tube have been removed. Left IJ catheter remains in place.  Right pleural effusion is decreased in size after thoracentesis. There is a new lenticular lucency projecting over the right lower chest suspicious for a small anterior pneumothorax. Small left pleural effusion is again seen. There is no left pneumothorax. Bibasilar atelectasis is noted.  IMPRESSION: Decreased right pleural effusion after thoracentesis with a likely small anterior pneumothorax identified.  No change in a small left pleural effusion and basilar atelectasis.  Status post extubation and NG tube removal.  Critical Value/emergent results were called by telephone at the time of interpretation on 09/27/2014 at 4:14 pm to Avon Gully, R.N., who verbally acknowledged these results.   Electronically Signed   By: Inge Rise M.D.   On: 09/27/2014 16:15   Dg Chest Port 1 View  09/27/2014   CLINICAL DATA:  SOB today; f/u right pleural effusion  EXAM: PORTABLE CHEST - 1 VIEW  COMPARISON:  the previous day's study  FINDINGS: Endotracheal tube, nasogastric tube, left IJ central line stable in position. Previous CABG. Stable mild cardiomegaly. Bilateral pleural effusions right greater than left unchanged. Patchy atelectasis or infiltrates in both lung bases persist. Mild central pulmonary vascular congestion.  IMPRESSION: 1. Stable pleural effusions and bilateral infiltrates or edema. 2.  Support hardware stable in position.   Electronically Signed   By: Lucrezia Europe M.D.   On: 09/27/2014 09:03   Dg Chest Port 1 View  09/26/2014   CLINICAL DATA:   Hospital admission for heart failure. Hypertension. COPD.  EXAM: PORTABLE CHEST - 1 VIEW  COMPARISON:  09/24/2014 and 09/22/2014  FINDINGS: Nasogastric tube courses into the stomach and off the inferior portion of the film as tip is not visualized.  Endotracheal tube has tip approximately 3.5 cm above the carina. Left IJ central venous catheter is unchanged with tip over the SVC.  Lungs are adequately inflated with persistent moderate size right pleural effusion and small left pleural effusion. Slight worsening airspace opacification over the right mid to lower lung and stable opacification over the left mid to lower lung. Findings likely represent combination of atelectasis as well as vascular congestion, although cannot exclude infection. Cardiomediastinal silhouette and remainder the exam is unchanged.  IMPRESSION: Slight worsening opacification over the right mid to lower lung with stable opacification of the left mid to lower lung. Findings likely represent combination of atelectasis and vascular congestion although cannot exclude infection. Persistent moderate size right effusion and small left effusion.  Tubes and lines as described.   Electronically Signed   By: Marin Olp M.D.   On: 09/26/2014 09:57   Dg Chest Port 1 View  09/24/2014   CLINICAL DATA:  Endotracheal tube present.  EXAM: PORTABLE CHEST - 1 VIEW  COMPARISON:  Sep 23, 2014.  FINDINGS: Stable cardiomediastinal silhouette. Status post coronary artery bypass graft. Endotracheal tube is in grossly good position with distal tip 4 cm above the carina. Left internal jugular catheter is unchanged with distal tip overlying expected position of the SVC. Stable bilateral pleural effusions are noted with right greater than left. Stable bilateral lower lobe opacities are also noted. Bony thorax is intact.  IMPRESSION: Stable bilateral lung opacities and pleural effusions. Stable support apparatus.   Electronically Signed   By: Marijo Conception, M.D.    On: 09/24/2014 07:16   Dg Chest Port 1 View  09/23/2014   CLINICAL DATA:  Pneumonia  EXAM: PORTABLE CHEST - 1 VIEW  COMPARISON:  09/22/2014  FINDINGS: Bilateral pleural effusions are stable. Left midlung airspace disease is improved. Tubular device is stable. No pneumothorax.  IMPRESSION: Improved left midlung airspace disease.  Stable bilateral pleural effusions.   Electronically Signed   By: Marybelle Killings M.D.   On: 09/23/2014 08:12   Dg Chest Port 1 View  09/22/2014   CLINICAL DATA:  Central line placement  EXAM: PORTABLE CHEST - 1 VIEW  COMPARISON:  09/22/2014  FINDINGS: Endotracheal tube with tip measuring 4.5 cm above the carinal. Enteric tube tip is off the field of view. Left central venous catheter with tip over the upper SVC region. No pneumothorax. Bilateral pleural effusions greater on the right with basilar atelectasis. Suggestion of developing airspace disease in the left mid lung possibly representing pneumonia. Calcified and tortuous aorta.  IMPRESSION: Appliances appear in satisfactory location. Bilateral pleural effusions, greater on the right. Suggestion of developing infiltration in the left mid lung.   Electronically Signed   By: Lucienne Capers M.D.   On: 09/22/2014 05:03   Dg Chest Port 1 View  09/22/2014   CLINICAL DATA:  Endotracheal tube placement  EXAM: PORTABLE CHEST - 1 VIEW  COMPARISON:  09/27/2014  FINDINGS: Interval placement of an endotracheal tube with tip measuring 4.1 cm above the carina. Postoperative changes in the mediastinum. Cardiac enlargement with mild pulmonary vascular congestion. Bilateral pleural effusions with basilar atelectasis. Mediastinal contours appear intact. Calcification of aorta.  IMPRESSION: Endotracheal tube appears in satisfactory position. Cardiac enlargement with mild pulmonary vascular congestion. Bilateral pleural effusions and basilar atelectasis.   Electronically Signed   By: Lucienne Capers M.D.   On: 09/22/2014 02:30   Dg Abd Acute  W/chest  09/13/2014   CLINICAL DATA:  Constipation for 5 days. Bilateral leg  swelling for 3 weeks. Abdominal pain. History of COPD and PE.  EXAM: DG ABDOMEN ACUTE W/ 1V CHEST  COMPARISON:  Chest 07/10/2014.  Abdomen 07/04/2014.  FINDINGS: Postoperative changes in the mediastinum. Mild cardiac enlargement without vascular congestion. Small bilateral pleural effusions, greater on the right. Bilateral basilar atelectasis. Emphysematous changes in the lungs. No pneumothorax. Calcification of the aorta.  Interval resolution of previous colonic ileus. Diffusely stool-filled colon. No small or large bowel distention. No free intra-abdominal air. No abnormal air-fluid levels. Vascular stents in vascular calcifications present. Degenerative changes in the spine and hips.  IMPRESSION: Cardiac enlargement. Bilateral pleural effusions and basilar atelectasis, greater on the right. Normal nonobstructive bowel gas pattern.   Electronically Signed   By: Lucienne Capers M.D.   On: 09/12/2014 22:34   Dg Abd Portable 1v  09/30/2014   CLINICAL DATA:  Feeding tube advancement.  EXAM: PORTABLE ABDOMEN - 1 VIEW  COMPARISON:  Film at 1037 hours  FINDINGS: Film at 1226 hours shows further advancement of the top off feeding tube with the tip now located in the region of the distal antrum of the stomach.  IMPRESSION: Feeding tube tip now in the distal gastric antrum.   Electronically Signed   By: Aletta Edouard M.D.   On: 09/30/2014 12:41   Dg Abd Portable 1v  09/30/2014   CLINICAL DATA:  Feeding tube placement.  EXAM: PORTABLE ABDOMEN - 1 VIEW  COMPARISON:  Study at 0050 hours  FINDINGS: Feeding tube shows further advancement with the tip now in the proximal stomach at the level of the fundus.  IMPRESSION: Feeding tube tip now in the proximal stomach.   Electronically Signed   By: Aletta Edouard M.D.   On: 09/30/2014 11:02   Dg Swallowing Func-speech Pathology  09/28/2014    Objective Swallowing Evaluation:    Patient Details   Name: Nathan [name] MRN: [id number] Date of Birth: May 15, 1940  Today's Date: 09/28/2014 Time: SLP Start Time (ACUTE ONLY): 1400-SLP Stop Time (ACUTE ONLY): 1425 SLP Time Calculation (min) (ACUTE ONLY): 25 min  Past Medical History:  Past Medical History  Diagnosis Date  . Aorto-iliac disease     a. s/p bilat with stent 2007 - followed by [facility].  . CAD (coronary artery disease)     a. 10/2011 s/p CABG x4 (LIMA-LAD, SVG-Diag, SVG-OM1 and SVG-PDA)  b.  03/2012 NSTEMI s/p LHC SVG-RCA and LIMA-LAD but total occlusion of SVG-OM  as well as SVG-diag. RX therapy recommended   . Hypertension   . ED (erectile dysfunction)   . COPD, severe   . Hyperlipidemia   . Stroke   . GERD (gastroesophageal reflux disease)   . Gout   . Paroxysmal atrial flutter     ablation  . Chronic combined systolic and diastolic CHF (congestive heart failure)     a. Last echo 04/2014: EF 40-45%.  Marland Kitchen PAF (paroxysmal atrial fibrillation)   . CKD (chronic kidney disease), stage III   . Tobacco abuse   . History of noncompliance with medical treatment   . Carotid artery disease     a. RCEA 2013 - followed by [facility].  . Anemia   . Lower GI bleed     a. LGIB 10/2012: Colonoscopy performed June 21 showed blood in the colon  but no definite source identified, diverticular versus AVM suspected.  Aspirin stopped.  . Mitral regurgitation   . Hypertensive heart disease   . PE (pulmonary embolism)    Past Surgical History:  Past Surgical  History  Procedure Laterality Date  . Spinal fusion    . Hand surgery    . Neck fusion  1975  . Eye surgery    . Coronary artery bypass graft  10/31/2011    Procedure: CORONARY ARTERY BYPASS GRAFTING (CABG);  Surgeon: Grace Isaac, MD;  Location: [facility];  Service: Open Heart Surgery;  Laterality:  N/A;  times three using  Left Internal Mammary Artery and Right Greater  Saphenous Vein Graft harvested endoscopically; TEE  . Endarterectomy  10/31/2011    Procedure: ENDARTERECTOMY CAROTID;  Surgeon: Serafina Mitchell, MD;   Location[facility] OR;   Service: Vascular;  Laterality: Right;  with patch  angioplasty   . Carotid endarterectomy  10-31-2011    right  . Colonoscopy N/A 10/30/2012    Procedure: COLONOSCOPY;  Surgeon: Lear Ng, MD;  Location: [facility]  ENDOSCOPY;  Service: Endoscopy;  Laterality: N/A;  . Left heart catheterization with coronary angiogram N/A 09/04/2011    Procedure: LEFT HEART CATHETERIZATION WITH CORONARY ANGIOGRAM;  Surgeon:  Sinclair Grooms, MD;  Location: [facility] CATH LAB;  Service: Cardiovascular;   Laterality: N/A;  . Left heart cath N/A 04/03/2012    Procedure: LEFT HEART CATH;  Surgeon: Troy Sine, MD;  Location: [facility]  CATH LAB;  Service: Cardiovascular;  Laterality: N/A;  . Pelvic fracture surgery     HPI:  Other Pertinent Information: 74 year old male admitted 09/22/2014 due to  colitis, HCAP, sepsis. Pt deteriorated and was intubated 09/22/14.  Extubated 09/27/14.  PMH significant for CVA, GERD, COPD. Orders received  for swallow evaluation.  No Data Recorded  Assessment / Plan / Recommendation CHL IP CLINICAL IMPRESSIONS 09/28/2014  Therapy Diagnosis (None)  Clinical Impression Pt demonstrates a severe oropharyngeal dysphagia with  silent penetration and aspiration of all consistencies tested. There is  reduced laryngeal closure during the swallow with all boluses reaching the  vocal folds. Hyolaryngeal excursion is particularly limited resulting in  poor opening of UES. Penetrate and residuals present post swallow are  silently penetrated/aspirated despite cues for multiple swallows and  throat clears. A chin tuck did slightly improve UES opening, but not  enough for any texture to be safely consumed.  Suspect pt likely had a  structural dysphagia prior to admission given curvature of spine and  suspected impact on UES function.  Recommend pt remain NPO with laryngeal  closure exercises and ongoing PO trials. Consider short term method of  nutrition.       CHL IP TREATMENT RECOMMENDATION 09/28/2014  Treatment Recommendations  Therapy as outlined in treatment plan below     CHL IP DIET RECOMMENDATION 09/28/2014  SLP Diet Recommendations Alternative means - temporary  Liquid Administration via (None)  Medication Administration Via alternative means  Compensations (None)  Postural Changes and/or Swallow Maneuvers (None)     CHL IP OTHER RECOMMENDATIONS 09/28/2014  Recommended Consults (None)  Oral Care Recommendations Oral care QID  Other Recommendations (None)     CHL IP FOLLOW UP RECOMMENDATIONS 06/15/2014  Follow up Recommendations None     CHL IP FREQUENCY AND DURATION 09/28/2014  Speech Therapy Frequency (ACUTE ONLY) min 3x week  Treatment Duration 2 weeks     Pertinent Vitals/Pain NA    SLP Swallow Goals CHL IP SWALLOW STUDY GOALS 11/14/2011  Patient will consume recommended diet without observed clinical signs of  aspiration with Supervision/safety  Swallow Study Goal #1 - Progress Not Met  Patient will utilize recommended strategies during swallow  to increase  swallowing safety with Supervision/safety  Swallow Study Goal #2 - Progress Not met  Goal #3 (None)  Swallow Study Goal #3 - Progress (None)  Goal #4 (None)  Swallow Study Goal #4 - Progress (None)    No flowsheet data found.    CHL IP REASON FOR REFERRAL 09/28/2014  Reason for Referral Objectively evaluate swallowing function     CHL IP ORAL PHASE 09/28/2014  Lips (None)  Tongue (None)  Mucous membranes (None)  Nutritional status (None)  Other (None)  Oxygen therapy (None)  Oral Phase Impaired  Oral - Pudding Teaspoon (None)  Oral - Pudding Cup (None)  Oral - Honey Teaspoon (None)  Oral - Honey Cup (None)  Oral - Honey Syringe (None)  Oral - Nectar Teaspoon (None)  Oral - Nectar Cup (None)  Oral - Nectar Straw (None)  Oral - Nectar Syringe (None)  Oral - Ice Chips (None)  Oral - Thin Teaspoon (None)  Oral - Thin Cup (None)  Oral - Thin Straw (None)  Oral - Thin Syringe (None)  Oral - Puree (None)  Oral - Mechanical Soft (None)  Oral - Regular (None)  Oral - Multi-consistency (None)   Oral - Pill (None)  Oral Phase - Comment (None)      CHL IP PHARYNGEAL PHASE 09/28/2014  Pharyngeal Phase Impaired  Pharyngeal - Pudding Teaspoon (None)  Penetration/Aspiration details (pudding teaspoon) (None)  Pharyngeal - Pudding Cup (None)  Penetration/Aspiration details (pudding cup) (None)  Pharyngeal - Honey Teaspoon (None)  Penetration/Aspiration details (honey teaspoon) (None)  Pharyngeal - Honey Cup (None)  Penetration/Aspiration details (honey cup) (None)  Pharyngeal - Honey Syringe (None)  Penetration/Aspiration details (honey syringe) (None)  Pharyngeal - Nectar Teaspoon (None)  Penetration/Aspiration details (nectar teaspoon) (None)  Pharyngeal - Nectar Cup (None)  Penetration/Aspiration details (nectar cup) (None)  Pharyngeal - Nectar Straw (None)  Penetration/Aspiration details (nectar straw) (None)  Pharyngeal - Nectar Syringe (None)  Penetration/Aspiration details (nectar syringe) (None)  Pharyngeal - Ice Chips (None)  Penetration/Aspiration details (ice chips) (None)  Pharyngeal - Thin Teaspoon (None)  Penetration/Aspiration details (thin teaspoon) (None)  Pharyngeal - Thin Cup (None)  Penetration/Aspiration details (thin cup) (None)  Pharyngeal - Thin Straw (None)  Penetration/Aspiration details (thin straw) (None)  Pharyngeal - Thin Syringe (None)  Penetration/Aspiration details (thin syringe') (None)  Pharyngeal - Puree (None)  Penetration/Aspiration details (puree) (None)  Pharyngeal - Mechanical Soft (None)  Penetration/Aspiration details (mechanical soft) (None)  Pharyngeal - Regular (None)  Penetration/Aspiration details (regular) (None)  Pharyngeal - Multi-consistency (None)  Penetration/Aspiration details (multi-consistency) (None)  Pharyngeal - Pill (None)  Penetration/Aspiration details (pill) (None)  Pharyngeal Comment (None)      CHL IP CERVICAL ESOPHAGEAL PHASE 09/28/2014  Cervical Esophageal Phase Impaired  Pudding Teaspoon (None)  Pudding Cup (None)  Honey Teaspoon (None)  Honey Cup  (None)  Honey Straw (None)  Nectar Teaspoon (None)  Nectar Cup (None)  Nectar Straw (None)  Nectar Sippy Cup (None)  Thin Teaspoon (None)  Thin Cup (None)  Thin Straw (None)  Thin Sippy Cup (None)  Cervical Esophageal Comment decreased opening of cervical esophagus.     No flowsheet data found.         DeBlois, Katherene Ponto 09/28/2014, 2:50 PM       ASSESSMENT AND PLAN: Nathan [name] is a 74 y.o. male with a history of chronic combine S/D CHF, CAD s/p CABG x4 (2013), COPD, CKD, HTN, PVD s/p bilat aorto-iliac stenting (2007), carotid artery disease s/p RCEA (2013),  CVA, PAF s/p ablation on coumadin, previous LGIB (2014), recent PE after hip fracture and mod MR who [facility] on [facility]16[facility] w/ constipation and SOB and found to have sepsis 2/2 HCAP and colitis. Patient also noted to have severe coagulopathy with INR >10, acute renal failure and hypotension. He had been improving until 5/12 early am with rapid deterioration with obtundation, bradycardia, and hypoxia (50% O2 sat). Intubated till 05/18. 2D ECHO revealed new severe MR and recommended TEE to further investigate. Cardiology consulted for new valvular disease and further testing.   Severe MR- 2D ECHO 06/2014 with EF 40% with diffuse HK and mod MR. Limited 2D ECHO this admission with stable LVEF (45-50%) but now severe MR and suggesting TEE to further investigate. Also 2D ECHO suggestive of RV pressure/volume overload, mod TR and mild pulm HTN.  -- Dr Tamala Julian, his primary cardiologist believes he has significant MR based upon exam and prior echo studies. MR is likely related to papillary muscle dysfunction in this patient with h/o prior inferior MI. AF with RVR is not helping the overall situation. BB therapy had initially been withdrawn due to hypotension (since resumed). He would not be a candidate for repeat open heart surgery.  AF RVR- Initially loaded with digoxin, now on Coreg and amiodarone with plans for TEE/cardioversion prior to  discharge as management of heart failure will be easier with sinus rhythm.  -- Started on low dose of BB: Coreg 3.125 mg bid --on heparin infusion.  NSVT- Resolved with K repletement, K now 3.5. Continue Coreg. Will give additional supplemental KCl given frequent PVC's.  Acute hypoxemic respiratory failure - per CCM, extubated 05/18  Shock - septic vs cardiogenic-->shock resolved. Now off pressors.   Acute on chronic systolic/diastolic CHF - echo 8/33/82: ef 45% - Lasix gtt d/c'd 05/18. Given IV Lasix on 5/20 with over 2L output in 24 hours. Now on free water for hypernatremia. - CXR 09/30/14 with mildly increased pulmonary edema, mild increase in small left pleural effusion, and stable small to moderate right pleural effusion. -Will give one dose of IV Lasix 40 mg. - Possible medial rt ptx, suspect that this is ex vacuo following R thoracentesis on 5/18 (transudate) - Volume status per CCM  Acute on CKD--> creat improved: peak 3.97 (05/12) 3.37--> 2.9 --> 2.8--> 2.6-->2.42 --> 2.15-->2.16-->1.95 (05/22)  Coumadin coagulopathy - Hold coumadin. INR 1.45 today. Pharmacy following INR will need to eventually go back on coumadin (5/19 plts 75, 98 today) - has been sub-therapeutic since 05/14. Plans for TEE prior to cardioversion at some point prior to discharge. --On heparin infusion at present being dosed by pharmacy.  Thrombocytopenia: Plt up to 80 today, HIT panel negative.  Acute metabolic encephalopathy/Dementia- Reportedly worsened recently per son. Mental status had reportedly improved significantly on 5/20, and alert today.     Kate Sable, M.D., F.A.C.C.

## 2014-10-01 NOTE — Progress Notes (Addendum)
[facility] PROGRESS NOTE  Nathan [name]:[id number] DOB: Dec 09, 1940 DOA: 2014-10-13 PCP: Lillia Mountain, MD   Brief narrative: 74 year old male with history of paroxysmal A. fib on Coumadin, goal C COPD, CAD, hypertension, systolic and diastolic CHF, chronic kidney disease stage III and history of PE was recently hospitalized for hip fracture that was managed conservatively. During the admission he was found to have PE and discharged on Coumadin.   Hospital course 5/9:  patient was admitted to the hospitalist service with colitis, sepsis due to healthcare associated pneumonia. 5/12: Patient became bradycardic, hypoxic and obtunded, intubated and transferred to ICU. 5/13: Had rapid deterioration with persistent bradycardia and found to be in septic shock requiring pressors. Worsened renal function. 5/14: Maintain on pressors. Echo done showed severe MR. 5/15: Drop in platelets (68) , weaned pressors but failed extubation. Started on tube feeds. 5/16:off pressors but failed spontaneous breathing trial. Started on Lasix drip and narrowed antibiotics. 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 Recommended patient will not tolerate surgery at this time and TEE may not contribute much. Added digoxin. 5/17: Patient more awake. Added Precedex. Switch ceftriaxone to Levaquin as patient . lasix dose increased . 5/18: Renal function worsened so Lasix drip was stopped. Right-sided thoracentesis done for pleural effusion with 1000 mL transited to fluid removed. Follow-up chest x-ray showed a small medial pneumothorax (ex vacuo). Patient was successfully extubated. 5/19: Stable ICU. Failed swallow evaluation. 5/20: Effusion returned. Lasix was added back. Placed panda tube. Pt  hypernatremic and started on free water. Antibiotic discontinued. 5/21: Patient transferred hospitalist service on telemetry. Patient  pulled out panda tube which was replaced.        Assessment/Plan:  Septic versus cardiogenic shock Now resolved. Off pressors.   Acute respiratory failure with hypoxemia Secondary to CHF and pneumonia. Slowly improving. Given one dose of IV Lasix today.   Acute on chronic combined systolic and diastolic CHF -2-D echo from 5/13 with EF of 45% with severe MR. 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 Cardiology following. -Lasix PRN.  Not a candidate for valve replacement surgery at this time.  -Monitor strict I/O -Continue beta blocker and statin.  Hypernatremia Increase free water via NG tube  A. fib with RVR Currently rate controlled. Initially loaded with digoxin. Now on low-dose Coreg and amiodarone. Cardiology plan   on TEE/cardioversion prior to discharge. On IV heparin.  Holding Coumadin due to thrombocytopenia.  HCAP Completed abx course.  Acute on chronic kidney injury Slowly improving. Continue to monitor.  Ongoing dysphagia Has failed swallow evaluation since extubation. Continue tube  feeding.  Thrombocytopenia  Possibly related to sepsis. HIT panel negative. Monitor daily.  Metabolic encephalopathy Mental status appears to have improved.  Right ex-vacuo pneumothorax.  Stable. Continue to monitor.  History of PE On Coumadin which is on hold due to thrombocytopenia. Continue to monitor while on heparin gtt.   hypokalemia Replenished   Diet: tube  Feeding  DVT prophylaxis: On IV heparin.  Code Status: partial. ( no CPR/ defibrillator or cardioversion, ok with intubation and vasoactive drugs) Family Communication: None at bedside Disposition Plan: currently inpatient. May benefit from LTAC. Social worker informed.   Consultants:  PCCM  cardiology  Procedures:  Intubation   2D ECHO  Antibiotics:  Completed on 5/20  HPI/Subjective: Sent seen and examined. Appears fatigued. Denies any pain or shortness of  breath.  Objective: Filed Vitals:   10/01/14 1407  BP:  129/58  Pulse: 87  Temp: 97.9 F (36.6 C)  Resp: 22    Intake/Output Summary (Last 24 hours) at 10/01/14 1501 Last data filed at 10/01/14 1407  Gross per 24 hour  Intake 3987.96 ml  Output   2800 ml  Net 1187.96 ml   Filed Weights   09/29/14 0500 09/30/14 0523 10/01/14 0515  Weight: 91.5 kg (201 lb 11.5 oz) 87.5 kg (192 lb 14.4 oz) 87.635 kg (193 lb 3.2 oz)    Exam:   General:  Elderly frail male in NAD, appears fatigued  HEENT: , dry oral mucosa, JVD+, lt IJ  Cardiovascular: S1&S2 regular, systolic murmur 3/6, no rubs or gallop  Respiratory: scattered rhonchi, coarse breath sounds b/l  GI: soft, NT, ND, BS+  musculoskeletal: 1+ edema bilaterally (improved,) scrotal edema, foley in place   CNS: alert and oriented     Data Reviewed: Basic Metabolic Panel:  Recent Labs Lab 09/25/14 0510  09/25/14 1800 09/26/14 0440 09/26/14 1030  09/26/14 2211 09/27/14 0410 09/27/14 0930 09/28/14 0440 09/29/14 0431 09/30/14 0133 10/01/14 0544  NA 143  < > 144 145 147*  < >  --   --  149* 147* 154* 153* 152*  K 2.9*  < > 5.4* 4.5 4.0  < >  --   --  3.4* 4.1 3.5 3.2* 3.8  CL 111  < > 113* 112* 114*  < >  --   --  113* 113* 117* 116* 116*  CO2 22  < > 21* 22 22  < >  --   --  25 25 26 26 27   GLUCOSE 112*  < > 115* 118* 108*  < >  --   --  114* 81 101* 86 116*  BUN 76*  < > 79* 79* 76*  < >  --   --  75* 77* 77* 67* 69*  CREATININE 2.90*  < > 2.92* 2.80* 2.60*  < >  --   --  2.50* 2.42* 2.15* 2.16* 1.95*  CALCIUM 7.6*  < > 7.9* 8.0* 7.9*  < >  --   --  8.1* 8.5* 8.7* 8.9 8.5*  MG 2.1  < > 2.2 2.2  2.2 2.1  --  2.0 2.0  --   --   --   --   --   PHOS 3.2  --   --  3.3  --   --   --  3.0  --   --  4.1  --   --   < > = values in this interval not displayed. Liver Function Tests:  Recent Labs Lab 09/26/14 0440 09/27/14 1645 09/30/14 0133  AST 51*  --  31  ALT 149*  --  66*  ALKPHOS 103  --  85  BILITOT 2.1*   --  1.8*  PROT 5.6* 6.3* 6.3*  ALBUMIN 1.9*  --  2.2*   No results for input(s): LIPASE, AMYLASE in the last 168 hours. No results for input(s): AMMONIA in the last 168 hours. CBC:  Recent Labs Lab 09/25/14 0510 09/26/14 0440 09/27/14 0410 09/28/14 0440 09/29/14 0431 09/30/14 0133 10/01/14 0544  WBC 5.7 6.2 4.4 5.7 4.3 5.1 6.3  NEUTROABS 4.2 4.3 2.9  --   --   --   --   HGB 9.7* 9.8* 9.0* 9.7* 8.9* 9.2* 8.4*  HCT 31.8* 33.2* 30.9* 33.9* 31.0* 32.5* 29.8*  MCV 81.7 82.8 83.1 86.0 87.1 86.4 87.4  PLT 57* 57* 59* 75* 76* 98* 80*  Cardiac Enzymes: No results for input(s): CKTOTAL, CKMB, CKMBINDEX, TROPONINI in the last 168 hours. BNP (last 3 results)  Recent Labs  06/26/14 0357 07/02/14 0410 2014/09/24 2159  BNP 626.8* 703.3* 1350.8*    ProBNP (last 3 results)  Recent Labs  01/04/14 0450 04/21/14 1504  PROBNP 3786.0* 13092.0*    CBG:  Recent Labs Lab 09/30/14 2005 10/01/14 0050 10/01/14 0410 10/01/14 0737 10/01/14 1139  GLUCAP 114* 109* 109* 103* 104*    Recent Results (from the past 240 hour(s))  Culture, respiratory (NON-Expectorated)     Status: None   Collection Time: 09/23/14  1:15 PM  Result Value Ref Range Status   Specimen Description TRACHEAL ASPIRATE  Final   Special Requests NONE  Final   Gram Stain   Final    RARE WBC PRESENT, PREDOMINANTLY PMN RARE SQUAMOUS EPITHELIAL CELLS PRESENT RARE YEAST Performed at [facility]    Culture   Final    MODERATE CANDIDA ALBICANS Performed at [facility]    Report Status 09/25/2014 FINAL  Final  Body fluid culture     Status: None   Collection Time: 09/27/14  3:50 PM  Result Value Ref Range Status   Specimen Description PLEURAL  Final   Special Requests NONE  Final   Gram Stain   Final    RARE WBC PRESENT, PREDOMINANTLY MONONUCLEAR NO ORGANISMS SEEN Performed at [facility]    Culture   Final    NO GROWTH 3 DAYS Performed at [facility]    Report  Status 10/01/2014 FINAL  Final     Studies: Dg Abd 1 View  09/30/2014   CLINICAL DATA:  Encounter for nasogastric tube placement.  EXAM: ABDOMEN - 1 VIEW  COMPARISON:  One day prior at 1038 hour  FINDINGS: Two portable AP supine views submitted. Initial image demonstrates weighted feeding tube tip in the mid chest in the region of the mid-esophagus. Subsequent image demonstrates advancement of the feeding tube with tip below the diaphragm in the stomach. There is contrast within the colon from prior modified barium swallow.  IMPRESSION: Tip of the weighted enteric tube in the stomach after advancement.   Electronically Signed   By: Rubye Oaks M.D.   On: 09/30/2014 01:28   Dg Chest Port 1 View  09/30/2014   CLINICAL DATA:  Followup right pleural effusion.  EXAM: PORTABLE CHEST - 1 VIEW  COMPARISON:  Yesterday.  FINDINGS: No significant change in a small to moderate size right pleural effusion and right basilar airspace opacity. Mild increase in size of a small left pleural effusion. Stable enlarged cardiac silhouette. Mild diffuse increase in prominence of the interstitial markings. Stable post CABG changes. Feeding tube extending into the stomach. Left jugular catheter tip in the superior vena cava. No pneumothorax.  IMPRESSION: 1. Mildly increased interstitial pulmonary edema. 2. Mild increased small left pleural effusion. 3. Stable small to moderate-sized right pleural effusion and associated right basilar probable atelectasis. 4. Stable cardiomegaly.   Electronically Signed   By: Beckie Salts M.D.   On: 09/30/2014 09:27   Dg Abd Portable 1v  09/30/2014   CLINICAL DATA:  Feeding tube advancement.  EXAM: PORTABLE ABDOMEN - 1 VIEW  COMPARISON:  Film at 1037 hours  FINDINGS: Film at 1226 hours shows further advancement of the top off feeding tube with the tip now located in the region of the distal antrum of the stomach.  IMPRESSION: Feeding tube tip now in the distal gastric antrum.  Electronically  Signed   By: Irish Lack M.D.   On: 09/30/2014 12:41   Dg Abd Portable 1v  09/30/2014   CLINICAL DATA:  Feeding tube placement.  EXAM: PORTABLE ABDOMEN - 1 VIEW  COMPARISON:  Study at 0050 hours  FINDINGS: Feeding tube shows further advancement with the tip now in the proximal stomach at the level of the fundus.  IMPRESSION: Feeding tube tip now in the proximal stomach.   Electronically Signed   By: Irish Lack M.D.   On: 09/30/2014 11:02    Scheduled Meds: . amiodarone  200 mg Oral BID  . antiseptic oral rinse  7 mL Mouth Rinse QID  . atorvastatin  10 mg Oral q1800  . budesonide (PULMICORT) nebulizer solution  0.5 mg Nebulization BID  . carvedilol  3.125 mg Per Tube BID WC  . chlorhexidine  15 mL Mouth Rinse BID  . free water  300 mL Per Tube 6 times per day  . Gerhardt's butt cream   Topical TID  . ipratropium-albuterol  3 mL Nebulization TID  . lipase/protease/amylase  2 capsule Oral Once   And  . sodium bicarbonate  650 mg Oral Once  . magnesium oxide  400 mg Per Tube Daily  . senna  1 tablet Oral Daily  . sodium chloride  3 mL Intravenous Q12H   Continuous Infusions: . feeding supplement (VITAL AF 1.2 CAL) 1,000 mL (10/01/14 0541)  . heparin 1,950 Units/hr (10/01/14 1322)      Time spent: 35 minutes    Shadow Schedler  [facility] Pager [phone]. If 7PM-7AM, please contact night-coverage at [email or web address], password [facility] 10/01/2014, 3:01 PM  LOS: 12 days

## 2014-10-01 NOTE — Progress Notes (Signed)
ANTICOAGULATION CONSULT NOTE - follow up  Pharmacy Consult for Heparin Indication: atrial fibrillation  No Known Allergies  Patient Measurements: Height: 6\' 1"  (185.4 cm) Weight: 193 lb 3.2 oz (87.635 kg) IBW/kg (Calculated) : 79.9 Heparin Dosing Weight: 91.5  Vital Signs: Temp: 97.9 F (36.6 C) (05/22 1407) Temp Source: Oral (05/22 1407) BP: 129/58 mmHg (05/22 1407) Pulse Rate: 87 (05/22 1407)  Labs:  Recent Labs  09/29/14 0431 09/30/14 0133  09/30/14 2050 10/01/14 0544 10/01/14 1423  HGB 8.9* 9.2*  --   --  8.4*  --   HCT 31.0* 32.5*  --   --  29.8*  --   PLT 76* 98*  --   --  80*  --   APTT 39*  --   --   --   --   --   LABPROT 18.0* 18.3*  --   --  17.7*  --   INR 1.48 1.51*  --   --  1.45  --   HEPARINUNFRC  --  0.10*  < > 0.27* 0.56 0.52  CREATININE 2.15* 2.16*  --   --  1.95*  --   < > = values in this interval not displayed.  Estimated Creatinine Clearance: 37 mL/min (by C-G formula based on Cr of 1.95).   Medical History: Past Medical History  Diagnosis Date  . Aorto-iliac disease     a. s/p bilat with stent 2007 - followed by [facility].  . CAD (coronary artery disease)     a. 10/2011 s/p CABG x4 (LIMA-LAD, SVG-Diag, SVG-OM1 and SVG-PDA)  b. 03/2012 NSTEMI s/p LHC SVG-RCA and LIMA-LAD but total occlusion of SVG-OM as well as SVG-diag. RX therapy recommended   . Hypertension   . ED (erectile dysfunction)   . COPD, severe   . Hyperlipidemia   . Stroke   . GERD (gastroesophageal reflux disease)   . Gout   . Paroxysmal atrial flutter     ablation  . Chronic combined systolic and diastolic CHF (congestive heart failure)     a. Last echo 04/2014: EF 40-45%.  Marland Kitchen. PAF (paroxysmal atrial fibrillation)   . CKD (chronic kidney disease), stage III   . Tobacco abuse   . History of noncompliance with medical treatment   . Carotid artery disease     a. RCEA 2013 - followed by [facility].  . Anemia   . Lower GI bleed     a. LGIB 10/2012: Colonoscopy performed June 21  showed blood in the colon but no definite source identified, diverticular versus AVM suspected. Aspirin stopped.  . Mitral regurgitation   . Hypertensive heart disease   . PE (pulmonary embolism)     Medications:  Scheduled:  . amiodarone  200 mg Oral BID  . antiseptic oral rinse  7 mL Mouth Rinse QID  . atorvastatin  10 mg Oral q1800  . budesonide (PULMICORT) nebulizer solution  0.5 mg Nebulization BID  . carvedilol  3.125 mg Per Tube BID WC  . chlorhexidine  15 mL Mouth Rinse BID  . free water  300 mL Per Tube 6 times per day  . Gerhardt's butt cream   Topical TID  . ipratropium-albuterol  3 mL Nebulization TID  . lipase/protease/amylase  2 capsule Oral Once   And  . sodium bicarbonate  650 mg Oral Once  . magnesium oxide  400 mg Per Tube Daily  . senna  1 tablet Oral Daily  . sodium chloride  3 mL Intravenous Q12H   Infusions:  .  feeding supplement (VITAL AF 1.2 CAL) 1,000 mL (10/01/14 0541)  . heparin 1,950 Units/hr (10/01/14 1322)    Assessment: 36 yoM w/ PMH AFib, recent PE, COPD, CKD3, HFpEF admitted 5/9 with colitis and pneumosepsis from HCAP.  Complicated hospital course requiring mechanical ventilation and pressor support.  Noted thrombocytopenia starting 5/15 and HIT panel ordered, which resulted negative.  Remains in Afib with rate controlled by BBs and digoxin.  Cardiology started amiodarone today without loading dose, stopped digoxin.  To resume anticoagulation with heparin per CCM.   INR supratherapeutic on admission, received multiple doses Vit K during admission; currently subtherapeutic  APTT: wnl (subtherapeutic)  Prior anticoagulation: warfarin with recent PE following hip surgery  CrCl 33.5 ml/min  Significant events: 5/18 HIT panel negative  Today, 10/01/2014:  HL 0.52 (therapeutic) on 1950 Units/hr.-confimatory  No bleeding/issues per RN  Goal of Therapy: Heparin level 0.3-0.7 units/ml Monitor platelets by anticoagulation protocol:  Yes  Plan:  Continue Heparin at 1950 units/hr IV infusion  Daily CBC, daily heparin level  Monitor for signs of bleeding or thrombosis  Follow-up plans for TEE/cardioversion and resuming PO anticoagulation   Arley Phenix RPh 10/01/2014, 3:26 PM Pager [phone]

## 2014-10-01 NOTE — Progress Notes (Signed)
Physical Therapy Treatment Patient Details Name: Nathan KellMaurice W Beigel MRN: [id number] DOB: Today's Date: 10/01/2014    History of Present Illness [age] year old male w/ PMH: PAF on coumadin, GOLD C COPD, CAD, HTN, systolic/diastolic CHF, CKD III, and PE. He was recently admitted for hip fracture (Feb 2016, DC to SNF then home) , which was to be managed conservatively. Pt admitted with HCAP, colitis, sepsis.     PT Comments    Pt in bed on 3 lts )2 with spouse Lynden AngCathy in room.  Assisted OOB to recliner required + 2 MAX assist.  Pt was unable to stand long enough to attempt gait.  Hoyer pad placed in recliner for staff to use lift to assit pt back to bed.   Pt will need [facility] at SNF  Follow Up Recommendations  SNF     Equipment Recommendations       Recommendations for Other Services       Precautions / Restrictions Precautions Precautions: Fall Precaution Comments: NPO Restrictions Weight Bearing Restrictions: No    Mobility  Bed Mobility Overal bed mobility: Needs Assistance Bed Mobility: Supine to Sit     Supine to sit: +2 for physical assistance;Max assist     General bed mobility comments: assist to initiate movement, +2 to raise trunk (pt 35%), verbal/manual cues for technique  Transfers Overall transfer level: Needs assistance Equipment used: Rolling walker (2 wheeled) Transfers: Sit to/from Stand Sit to Stand: +2 physical assistance;Max assist;From elevated surface         General transfer comment: assist to power up (pt 40%) from elevated bed, cues for hand placement and to correct flexed posture; sit to stand x 2 trials.  pt was only able to get to recliner incomplete turn and uncontrolled decend.  Ambulation/Gait             General Gait Details: unable to support self upright long enough to attempt amb   Stairs            Wheelchair Mobility    Modified Rankin (Stroke Patients Only)       Balance                                     Cognition Arousal/Alertness: Awake/alert Behavior During Therapy: WFL for tasks assessed/performed Overall Cognitive Status: Within Functional Limits for tasks assessed                      Exercises      General Comments        Pertinent Vitals/Pain Pain Assessment: No/denies pain    Home Living                      Prior Function            PT Goals (current goals can now be found in the care plan section) Progress towards PT goals: Progressing toward goals    Frequency  Min 3X/week    PT Plan      Co-evaluation             End of Session Equipment Utilized During Treatment: Gait belt;Oxygen (home ) 2    3 lts) Activity Tolerance: Patient limited by fatigue Patient left: in chair;with call bell/phone within reach;with family/visitor present     Time: 1441-1458 PT Time Calculation (min) (ACUTE ONLY): 17 min  Charges:  $Therapeutic  Activity: 8-22 mins                    G Codes:      Rica Koyanagi  PTA [facility]  Acute  Rehab Pager      [phone]

## 2014-10-02 LAB — CBC
HCT: 26.3 % — ABNORMAL LOW (ref 39.0–52.0)
Hemoglobin: 7.5 g/dL — ABNORMAL LOW (ref 13.0–17.0)
MCH: 25 pg — AB (ref 26.0–34.0)
MCHC: 28.5 g/dL — ABNORMAL LOW (ref 30.0–36.0)
MCV: 87.7 fL (ref 78.0–100.0)
Platelets: 82 10*3/uL — ABNORMAL LOW (ref 150–400)
RBC: 3 MIL/uL — AB (ref 4.22–5.81)
RDW: 23.2 % — ABNORMAL HIGH (ref 11.5–15.5)
WBC: 6.8 10*3/uL (ref 4.0–10.5)

## 2014-10-02 LAB — PROTIME-INR
INR: 1.62 — ABNORMAL HIGH (ref 0.00–1.49)
PROTHROMBIN TIME: 19.3 s — AB (ref 11.6–15.2)

## 2014-10-02 LAB — GLUCOSE, CAPILLARY
GLUCOSE-CAPILLARY: 106 mg/dL — AB (ref 65–99)
GLUCOSE-CAPILLARY: 109 mg/dL — AB (ref 65–99)
Glucose-Capillary: 100 mg/dL — ABNORMAL HIGH (ref 65–99)
Glucose-Capillary: 106 mg/dL — ABNORMAL HIGH (ref 65–99)
Glucose-Capillary: 86 mg/dL (ref 65–99)
Glucose-Capillary: 95 mg/dL (ref 65–99)
Glucose-Capillary: 97 mg/dL (ref 65–99)

## 2014-10-02 LAB — BASIC METABOLIC PANEL
Anion gap: 9 (ref 5–15)
BUN: 71 mg/dL — AB (ref 6–20)
CO2: 28 mmol/L (ref 22–32)
Calcium: 8.3 mg/dL — ABNORMAL LOW (ref 8.9–10.3)
Chloride: 116 mmol/L — ABNORMAL HIGH (ref 101–111)
Creatinine, Ser: 1.75 mg/dL — ABNORMAL HIGH (ref 0.61–1.24)
GFR calc Af Amer: 42 mL/min — ABNORMAL LOW (ref >60)
GFR, EST NON AFRICAN AMERICAN: 36 mL/min — AB (ref >60)
GLUCOSE: 111 mg/dL — AB (ref 65–99)
POTASSIUM: 4 mmol/L (ref 3.5–5.1)
SODIUM: 153 mmol/L — AB (ref 135–145)

## 2014-10-02 LAB — HEPARIN LEVEL (UNFRACTIONATED): Heparin Unfractionated: 0.59 IU/mL (ref 0.30–0.70)

## 2014-10-02 MED ORDER — FUROSEMIDE 10 MG/ML IJ SOLN
40.0000 mg | Freq: Two times a day (BID) | INTRAMUSCULAR | Status: AC
  Administered 2014-10-02 (×2): 40 mg via INTRAVENOUS
  Filled 2014-10-02 (×2): qty 4

## 2014-10-02 MED ORDER — DIPHENHYDRAMINE HCL 12.5 MG/5ML PO ELIX
12.5000 mg | ORAL_SOLUTION | Freq: Once | ORAL | Status: AC
  Administered 2014-10-02: 12.5 mg
  Filled 2014-10-02: qty 5

## 2014-10-02 MED ORDER — ACETAMINOPHEN 325 MG PO TABS
650.0000 mg | ORAL_TABLET | Freq: Four times a day (QID) | ORAL | Status: DC | PRN
  Administered 2014-10-02: 650 mg via ORAL
  Filled 2014-10-02: qty 2

## 2014-10-02 NOTE — Progress Notes (Signed)
Patient Name: Nathan [name] Date of Encounter: 10/02/2014  Principal Problem:   Sepsis Active Problems:   HCAP (healthcare-associated pneumonia)   Colitis   Constipation   AKI (acute kidney injury)   CKD (chronic kidney disease)   Acute systolic congestive heart failure   HLD (hyperlipidemia)   GERD without esophagitis   Shock   Malnutrition of moderate degree   Acute respiratory failure with hypoxia   Shock circulatory   ATN (acute tubular necrosis)   Mitral regurgitation   Abdominal pain   Paroxysmal atrial fibrillation   Pleural effusion, right   Dysphagia, pharyngoesophageal phase   Acute kidney injury   Primary Cardiologist: Dr Katrinka Blazing  Patient Profile: 74 y.o. male with hx chronic S/D CHF, CABG x4 (2013), COPD, CKD, HTN, PVD s/p bilat aorto-iliac stenting (2007), RCEA (2013), CVA, PAF s/p ablation on coumadin, LGIB (2014), PE & hip fx 06/2014, and mod MR admitted 09/13/2014 w/ constipation, SOB, sepsis 2/2 HCAP and colitis, severe coagulopathy with INR >10, ARF and hypotension. Intubated 05/12>05/18, effusions, s/p tap 05/18, had small PTX. Echo w/ severe MR and cards consulted for eval and possible TEE.  SUBJECTIVE: Pt alert and confused, responsive to questions. Sings at times.  OBJECTIVE Filed Vitals:   10/01/14 1932 10/01/14 2002 10/02/14 0415 10/02/14 0747  BP:  134/61 133/56   Pulse:  84 95   Temp:  97.6 F (36.4 C) 97.8 F (36.6 C)   TempSrc:  Axillary Oral   Resp:  22 20   Height:      Weight:   192 lb 14.4 oz (87.5 kg)   SpO2: 98% 96% 100% 94%    Intake/Output Summary (Last 24 hours) at 10/02/14 0824 Last data filed at 10/02/14 0600  Gross per 24 hour  Intake 2543.5 ml  Output   2250 ml  Net  293.5 ml   Filed Weights   09/30/14 0523 10/01/14 0515 10/02/14 0415  Weight: 192 lb 14.4 oz (87.5 kg) 193 lb 3.2 oz (87.635 kg) 192 lb 14.4 oz (87.5 kg)    PHYSICAL EXAM General: Well developed, well nourished, male in no acute distress. Head:  Normocephalic, atraumatic.  Neck: Supple without bruits, JVD difficult to assess 2nd lines. Lungs:  Resp regular and unlabored, rales and wheeze. Heart: Irreg irreg, S1, S2, no S3, S4, 2/6 murmur; no rub. Abdomen: Soft, non-tender, non-distended, BS + x 4.  Extremities: No clubbing, cyanosis, 2+ pedal edema.  Neuro: Alert and oriented X 2. Moves all extremities spontaneously. Psych: Normal affect.  LABS: CBC: Recent Labs  10/01/14 0544 10/02/14 0437  WBC 6.3 6.8  HGB 8.4* 7.5*  HCT 29.8* 26.3*  MCV 87.4 87.7  PLT 80* 82*   INR: Recent Labs  10/02/14 0437  INR 1.62*   Basic Metabolic Panel: Recent Labs  10/01/14 0544 10/02/14 0437  NA 152* 153*  K 3.8 4.0  CL 116* 116*  CO2 27 28  GLUCOSE 116* 111*  BUN 69* 71*  CREATININE 1.95* 1.75*  CALCIUM 8.5* 8.3*   Liver Function Tests: Recent Labs  09/30/14 0133  AST 31  ALT 66*  ALKPHOS 85  BILITOT 1.8*  PROT 6.3*  ALBUMIN 2.2*   BNP:  B NATRIURETIC PEPTIDE  Date/Time Value Ref Range Status  09/22/2014 09:59 PM 1350.8* 0.0 - 100.0 pg/mL Final  07/02/2014 04:10 AM 703.3* 0.0 - 100.0 pg/mL Final   TELE:  Atrial fib, rate controlled      Radiology/Studies: Dg Abd Portable 1v  09/30/2014  CLINICAL DATA:  Feeding tube advancement.  EXAM: PORTABLE ABDOMEN - 1 VIEW  COMPARISON:  Film at 1037 hours  FINDINGS: Film at 1226 hours shows further advancement of the top off feeding tube with the tip now located in the region of the distal antrum of the stomach.  IMPRESSION: Feeding tube tip now in the distal gastric antrum.   Electronically Signed   By: Irish LackGlenn  Yamagata M.D.   On: 09/30/2014 12:41   Dg Abd Portable 1v  09/30/2014   CLINICAL DATA:  Feeding tube placement.  EXAM: PORTABLE ABDOMEN - 1 VIEW  COMPARISON:  Study at 0050 hours  FINDINGS: Feeding tube shows further advancement with the tip now in the proximal stomach at the level of the fundus.  IMPRESSION: Feeding tube tip now in the proximal stomach.    Electronically Signed   By: Irish LackGlenn  Yamagata M.D.   On: 09/30/2014 11:02   Dg Chest Port 1 View 09/30/2014   CLINICAL DATA:  Followup right pleural effusion.  EXAM: PORTABLE CHEST - 1 VIEW  COMPARISON:  Yesterday.  FINDINGS: No significant change in a small to moderate size right pleural effusion and right basilar airspace opacity. Mild increase in size of a small left pleural effusion. Stable enlarged cardiac silhouette. Mild diffuse increase in prominence of the interstitial markings. Stable post CABG changes. Feeding tube extending into the stomach. Left jugular catheter tip in the superior vena cava. No pneumothorax.  IMPRESSION: 1. Mildly increased interstitial pulmonary edema. 2. Mild increased small left pleural effusion. 3. Stable small to moderate-sized right pleural effusion and associated right basilar probable atelectasis. 4. Stable cardiomegaly.   Electronically Signed   By: Beckie SaltsSteven  Reid M.D.   On: 09/30/2014 09:27   Dg Chest Port 1 View 09/29/2014   CLINICAL DATA:  Respiratory failure, effusions.  EXAM: PORTABLE CHEST - 1 VIEW  COMPARISON:  Yesterday at 0543 hour  FINDINGS: Tip of the left central line remains in the SVC. Cardiomegaly is unchanged. Increased right pleural effusion with increasing fluid in the fissure. Lucency adjacent to the right heart border may be related to aerated lung adjacent to pleural effusions versus pneumothorax, this has minimally progressed from prior. Left pleural effusion is unchanged. Bibasilar opacities are likely related to compressive atelectasis. Pulmonary vasculature is normal. There is increasing bilateral perihilar atelectasis.  IMPRESSION: 1. Increasing right pleural effusion with increasing fluid in the fissure. Lucency about the right heart border, may reflect aerated lung adjacent to pleural fluid versus small pneumothorax. 2. Unchanged left pleural effusion. 3. Increasing bilateral perihilar atelectasis.   Electronically Signed   By: Rubye OaksMelanie  Ehinger    Dg Swallowing Func-speech Pathology 09/28/2014    Objective Swallowing Evaluation:   IMPRESSIONS 09/28/2014  Therapy Diagnosis (None)  Clinical Impression Pt demonstrates a severe oropharyngeal dysphagia with  silent penetration and aspiration of all consistencies tested. There is  reduced laryngeal closure during the swallow with all boluses reaching the  vocal folds. Hyolaryngeal excursion is particularly limited resulting in  poor opening of UES. Penetrate and residuals present post swallow are  silently penetrated/aspirated despite cues for multiple swallows and  throat clears.   Current Medications:  . amiodarone  200 mg Oral BID  . antiseptic oral rinse  7 mL Mouth Rinse QID  . atorvastatin  10 mg Oral q1800  . budesonide (PULMICORT) nebulizer solution  0.5 mg Nebulization BID  . carvedilol  3.125 mg Per Tube BID WC  . chlorhexidine  15 mL Mouth Rinse BID  . free  water  300 mL Per Tube 6 times per day  . Gerhardt's butt cream   Topical TID  . ipratropium-albuterol  3 mL Nebulization TID  . lipase/protease/amylase  2 capsule Oral Once   And  . sodium bicarbonate  650 mg Oral Once  . magnesium oxide  400 mg Per Tube Daily  . senna  1 tablet Oral Daily  . sodium chloride  3 mL Intravenous Q12H   . feeding supplement (VITAL AF 1.2 CAL) 1,000 mL (10/02/14 0131)  . heparin 1,950 Units/hr (10/02/14 0246)    ASSESSMENT AND PLAN: Severe MR- 2D ECHO 06/2014 with EF 40% with diffuse HK and mod MR, RV pressure/volume overload, mod TR and mild pulm HTN. Limited 2D ECHO this admission with stable LVEF but now severe MR and suggesting TEE to further investigate.  -- Dr Katrinka Blazing, his primary cardiologist believes he has significant MR based upon exam and prior echo studies. MR is likely related to papillary muscle dysfunction in this patient with h/o prior inferior MI.  -- AF with RVR is not helping the overall situation. BB therapy held due to hypotension, restarted 05/19. Unless there is concern  about endocarditis(duration of antibiotic therapy), TEE will not add much and may be harmful given co-morbidities and tenuous hemodynamics. He would not be a candidate for repeat open heart surgery.  AF RVR- Will continue dig to control VR. Loaded 5/16>09/26/2014. Dig level 0.8 05/18. Started on .0625 mg daily 05/18. -- Currently rates are much better controlled.  -- Started on Coreg 3.125 mg bid 05/19  NSVT- PVCs overnight. Previously had multiple short runs on tele. Continue BB.   Acute hypoxemic respiratory failure - per CCM, extubated 05/18  Shock - septic vs cardiogenic-->shock resolved. Now off pressors.   Acute on chronic systolic/diastolic CHF - echo 06/24/06: ef 45% - Lasix gtt d/c'd 05/18.  - Possible medial rt ptx, suspect that this is ex vacuo following R thoracentesis on 5/18 (transudate) - Pt appears overloaded, got Lasix 40 mg IV x 1 05/22, will give Lasix 40 mg IV BID x 2 doses, recheck BMET in am  Acute on CKD--> creat improved: peak 3.97 (05/12) 3.37--> 2.9 --> 2.15 (05/20)-->1.75 (05/23)  Coumadin coagulopathy - Coumadin on hold. INR 1.48 today. Pharmacy following, now back on heparin - has been sub-therapeutic since 05/14, if restoration of SR planned, will need TEE.  Thrombocytopenia:  --  5/19 plts 75 --  Plt up to 82 today  Acute metabolic encephalopathy/Dementia-  -- worsening recently per son.  -- Mental status has improved a lot with improvement in his medical condition.  -- He appears back at his baseline.    Otherwise, per IM Principal Problem:   Sepsis Active Problems:   HCAP (healthcare-associated pneumonia)   Colitis   Constipation   AKI (acute kidney injury)   CKD (chronic kidney disease)   Acute systolic congestive heart failure   HLD (hyperlipidemia)   GERD without esophagitis   Shock   Malnutrition of moderate degree   Acute respiratory failure with hypoxia   Shock circulatory   ATN (acute tubular necrosis)   Mitral  regurgitation   Abdominal pain   Paroxysmal atrial fibrillation   Pleural effusion, right   Dysphagia, pharyngoesophageal phase   Acute kidney injury   Signed, Theodore Demark , PA-C 8:24 AM 10/02/2014  I have personally seen and examined this patient with Theodore Demark, PA-C. I agree with the assessment and plan as outlined above. He is pleasantly confused. He remains  in atrial fib but rate controlled. Hypotension resolved. Would not plan cardioversion at this time. TEE would be needed prior to cardioversion as INR has been sub-therapeutic. Severe MR on echo but not operative candidate. IV Lasix today for volume overload.   MCALHANY,CHRISTOPHER 10/02/2014 10:38 AM

## 2014-10-02 NOTE — Progress Notes (Addendum)
[facility] PROGRESS NOTE  Nathan [name] ZOX:[id number] DOB: 1940-10-04 DOA: 09/30/2014 PCP: Lillia Mountain, MD   Brief narrative: 74 year old male with history of paroxysmal A. fib on Coumadin, goal C COPD, CAD, hypertension, systolic and diastolic CHF, chronic kidney disease stage III and history of PE was recently hospitalized for hip fracture that was managed conservatively. During the admission he was found to have PE and discharged on Coumadin.   Hospital course 5/9:  patient was admitted to the hospitalist service with colitis, sepsis due to healthcare associated pneumonia. 5/12: Patient became bradycardic, hypoxic and obtunded, intubated and transferred to ICU. 5/13: Had rapid deterioration with persistent bradycardia and found to be in septic shock requiring pressors. Worsened renal function. 5/14: Maintain on pressors. Echo done showed severe MR. 5/15: Drop in platelets (68) , weaned pressors but failed extubation. Started on tube feeds. 5/16:off pressors but failed spontaneous breathing trial. Started on Lasix drip and narrowed antibiotics. 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 Recommended patient will not tolerate surgery at this time and TEE may not contribute much. Added digoxin. 5/17: Patient more awake. Added Precedex. Switch ceftriaxone to Levaquin as patient . lasix dose increased . 5/18: Renal function worsened so Lasix drip was stopped. Right-sided thoracentesis done for pleural effusion with 1000 mL transited to fluid removed. Follow-up chest x-ray showed a small medial pneumothorax (ex vacuo). Patient was successfully extubated. 5/19: Stable ICU. Failed swallow evaluation. 5/20: Effusion returned. Lasix was added back. Placed panda tube. Pt  hypernatremic and started on free water. Antibiotic discontinued. 5/21: Patient transferred hospitalist service on telemetry. Patient  pulled out panda tube which was replaced.        Assessment/Plan:  Septic versus cardiogenic shock Now resolved. Off pressors.   Acute respiratory failure with hypoxemia Secondary to CHF and pneumonia. Slowly improving. Receiving Lasix when necessary   Acute on chronic combined systolic and diastolic CHF -2-D echo from 5/13 with EF of 45% with severe MR. 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 Cardiology following. -Lasix PRN. (IV Lasix 40 mg x 2 doses ordered ). Not a candidate for valve replacement surgery at this time.  -Monitor strict I/O. Still has high positive balance. -Continue beta blocker and statin.  Acute on chronic kidney injury Improving daily.. Continue to monitor.  Ongoing dysphagia Has failed swallow evaluation since extubation. Continue tube  Feeding for now. Patient may need of feeding tube . Will discuss with wife if failed repeat swallow eval again on 5/25.  Hypernatremia Continue free water via NG tube  A. fib with RVR Currently rate controlled. Initially loaded with digoxin. Now on low-dose Coreg and amiodarone. Cardiology plan   on TEE/cardioversion prior to discharge. On IV heparin.  Holding Coumadin due to thrombocytopenia.  Anemia Noted for drop in H&H from yesterday. Monitor closely while on heparin drip. No transfusion needed at this time.  HCAP Completed abx course.    Thrombocytopenia  Possibly related to sepsis. HIT panel negative. Monitor daily.  Metabolic encephalopathy Noted to be more confused early this morning but seemed to improve during the day. Monitor closely.  Right ex-vacuo pneumothorax.  Stable. Continue to monitor.  History of PE On Coumadin which is on hold due to thrombocytopenia. Continue to monitor while on heparin gtt.   hypokalemia Replenished   Diet: tube  Feeding  DVT prophylaxis: On IV heparin.  Code Status: partial. ( no CPR/ defibrillator or cardioversion, ok with intubation and  vasoactive  drugs) Family Communication: None at bedside Disposition Plan: currently inpatient. Will need skilled nursing facility upon discharge. Has ongoing issues (dysphagia requiring NG tube feeds, on IV heparin drip and volume overload) so not ready for discharge.    Consultants:  PCCM  cardiology  Procedures:  Intubation   2D ECHO  Antibiotics:  Completed on 5/20  HPI/Subjective: Recent seen and examined. Appeared more confused this morning. Being my evaluation it during the morning patient was more oriented and communicating well. Denies any specific symptoms.  Objective: Filed Vitals:   10/02/14 0415  BP: 133/56  Pulse: 95  Temp: 97.8 F (36.6 C)  Resp: 20    Intake/Output Summary (Last 24 hours) at 10/02/14 1445 Last data filed at 10/02/14 1338  Gross per 24 hour  Intake 2546.5 ml  Output   2150 ml  Net  396.5 ml   Filed Weights   09/30/14 0523 10/01/14 0515 10/02/14 0415  Weight: 87.5 kg (192 lb 14.4 oz) 87.635 kg (193 lb 3.2 oz) 87.5 kg (192 lb 14.4 oz)    Exam:   General:  Elderly frail male in NAD, appears fatigued  HEENT: , dry oral mucosa, JVD+, lt IJ  Cardiovascular: S1&S2 regular, systolic murmur 3/6, no rubs or gallop  Respiratory: Diminished bibasilar breath sounds, scattered rhonchi  GI: soft, NT, ND, BS+  musculoskeletal: 1+ edema bilaterally, scrotal edema, foley in place   CNS: alert and oriented x2     Data Reviewed: Basic Metabolic Panel:  Recent Labs Lab 09/25/14 1800 09/26/14 0440 09/26/14 1030  09/26/14 2211 09/27/14 0410  09/28/14 0440 09/29/14 0431 09/30/14 0133 10/01/14 0544 10/02/14 0437  NA 144 145 147*  < >  --   --   < > 147* 154* 153* 152* 153*  K 5.4* 4.5 4.0  < >  --   --   < > 4.1 3.5 3.2* 3.8 4.0  CL 113* 112* 114*  < >  --   --   < > 113* 117* 116* 116* 116*  CO2 21* 22 22  < >  --   --   < > GLUCOSE 115* 118* 108*  < >  --   --   < > 81 101* 86 116* 111*  BUN 79* 79* 76*  < >  --   --    < > 77* 77* 67* 69* 71*  CREATININE 2.92* 2.80* 2.60*  < >  --   --   < > 2.42* 2.15* 2.16* 1.95* 1.75*  CALCIUM 7.9* 8.0* 7.9*  < >  --   --   < > 8.5* 8.7* 8.9 8.5* 8.3*  MG 2.2 2.2  2.2 2.1  --  2.0 2.0  --   --   --   --   --   --   PHOS  --  3.3  --   --   --  3.0  --   --  4.1  --   --   --   < > = values in this interval not displayed. Liver Function Tests:  Recent Labs Lab 09/26/14 0440 09/27/14 1645 09/30/14 0133  AST 51*  --  31  ALT 149*  --  66*  ALKPHOS 103  --  85  BILITOT 2.1*  --  1.8*  PROT 5.6* 6.3* 6.3*  ALBUMIN 1.9*  --  2.2*   No results for input(s): LIPASE, AMYLASE in the last 168 hours. No results  for input(s): AMMONIA in the last 168 hours. CBC:  Recent Labs Lab 09/26/14 0440 09/27/14 0410 09/28/14 0440 09/29/14 0431 09/30/14 0133 10/01/14 0544 10/02/14 0437  WBC 6.2 4.4 5.7 4.3 5.1 6.3 6.8  NEUTROABS 4.3 2.9  --   --   --   --   --   HGB 9.8* 9.0* 9.7* 8.9* 9.2* 8.4* 7.5*  HCT 33.2* 30.9* 33.9* 31.0* 32.5* 29.8* 26.3*  MCV 82.8 83.1 86.0 87.1 86.4 87.4 87.7  PLT 57* 59* 75* 76* 98* 80* 82*   Cardiac Enzymes: No results for input(s): CKTOTAL, CKMB, CKMBINDEX, TROPONINI in the last 168 hours. BNP (last 3 results)  Recent Labs  06/26/14 0357 07/02/14 0410 09/20/2014 2159  BNP 626.8* 703.3* 1350.8*    ProBNP (last 3 results)  Recent Labs  01/04/14 0450 04/21/14 1504  PROBNP 3786.0* 13092.0*    CBG:  Recent Labs Lab 10/01/14 1956 10/02/14 0003 10/02/14 0407 10/02/14 0804 10/02/14 1202  GLUCAP 105* 95 106* 97 109*    Recent Results (from the past 240 hour(s))  Culture, respiratory (NON-Expectorated)     Status: None   Collection Time: 09/23/14  1:15 PM  Result Value Ref Range Status   Specimen Description TRACHEAL ASPIRATE  Final   Special Requests NONE  Final   Gram Stain   Final    RARE WBC PRESENT, PREDOMINANTLY PMN RARE SQUAMOUS EPITHELIAL CELLS PRESENT RARE YEAST Performed at [facility]    Culture    Final    MODERATE CANDIDA ALBICANS Performed at [facility]    Report Status 09/25/2014 FINAL  Final  Body fluid culture     Status: None   Collection Time: 09/27/14  3:50 PM  Result Value Ref Range Status   Specimen Description PLEURAL  Final   Special Requests NONE  Final   Gram Stain   Final    RARE WBC PRESENT, PREDOMINANTLY MONONUCLEAR NO ORGANISMS SEEN Performed at [facility]    Culture   Final    NO GROWTH 3 DAYS Performed at [facility]    Report Status 10/01/2014 FINAL  Final     Studies: No results found.  Scheduled Meds: . amiodarone  200 mg Oral BID  . antiseptic oral rinse  7 mL Mouth Rinse QID  . atorvastatin  10 mg Oral q1800  . budesonide (PULMICORT) nebulizer solution  0.5 mg Nebulization BID  . carvedilol  3.125 mg Per Tube BID WC  . chlorhexidine  15 mL Mouth Rinse BID  . free water  300 mL Per Tube 6 times per day  . furosemide  40 mg Intravenous BID  . Gerhardt's butt cream   Topical TID  . ipratropium-albuterol  3 mL Nebulization TID  . lipase/protease/amylase  2 capsule Oral Once   And  . sodium bicarbonate  650 mg Oral Once  . magnesium oxide  400 mg Per Tube Daily  . senna  1 tablet Oral Daily  . sodium chloride  3 mL Intravenous Q12H   Continuous Infusions: . feeding supplement (VITAL AF 1.2 CAL) 1,000 mL (10/02/14 0131)  . heparin 1,950 Units/hr (10/02/14 0246)      Time spent: 35 minutes    Davaughn Hillyard  [facility] Pager [phone]. If 7PM-7AM, please contact night-coverage at [email or web address], password [email or web address] 10/02/2014, 2:45 PM  LOS: 13 days

## 2014-10-02 NOTE — Care Management Note (Signed)
Case Management Note  Patient Details  Name: Allen KellMaurice W Kettles MRN: [id number] Date of Birth: 09/05/1940  Subjective/Objective: [age] y/om admitted w/sepsis                   Action/Plan:d/c plan snf.   Expected Discharge Date:   (UNKNOWN)               Expected Discharge Plan:  Home/Self Care  In-House Referral:     Discharge planning Services  CM Consult  Post Acute Care Choice:    Choice offered to:     DME Arranged:    DME Agency:     HH Arranged:    HH Agency:     Status of Service:  In process, will continue to follow  Medicare Important Message Given:    Date Medicare IM Given:    Medicare IM give by:    Date Additional Medicare IM Given:    Additional Medicare Important Message give by:     If discussed at Long Length of Stay Meetings, dates discussed:    Additional Comments:  Lanier ClamMahabir, Kita Neace, RN 10/02/2014, 5:08 PM

## 2014-10-02 NOTE — Progress Notes (Addendum)
ANTICOAGULATION CONSULT NOTE - follow up  Pharmacy Consult for Heparin/warfarin Indication: atrial fibrillation  No Known Allergies  Patient Measurements: Height:  (185.4 cm) Weight: 192 lb 14.4 oz (87.5 kg) IBW/kg (Calculated) : 79.9 Heparin Dosing Weight: 91.5  Vital Signs: Temp: 97.8 F (36.6 C) (05/23 0415) Temp Source: Oral (05/23 0415) BP: 133/56 mmHg (05/23 0415) Pulse Rate: 95 (05/23 0415)  Labs:  Recent Labs  09/30/14 0133  10/01/14 0544 10/01/14 1423 10/02/14 0437  HGB 9.2*  --  8.4*  --   --   HCT 32.5*  --  29.8*  --   --   PLT 98*  --  80*  --   --   LABPROT 18.3*  --  17.7*  --  19.3*  INR 1.51*  --  1.45  --  1.62*  HEPARINUNFRC 0.10*  < > 0.56 0.52 0.59  CREATININE 2.16*  --  1.95*  --   --   < > = values in this interval not displayed.  Estimated Creatinine Clearance: 37 mL/min (by C-G formula based on Cr of 1.95).   Medical History: Past Medical History  Diagnosis Date  . Aorto-iliac disease     a. s/p bilat with stent 2007 - followed by [facility].  . CAD (coronary artery disease)     a. 10/2011 s/p CABG x4 (LIMA-LAD, SVG-Diag, SVG-OM1 and SVG-PDA)  b. 03/2012 NSTEMI s/p LHC SVG-RCA and LIMA-LAD but total occlusion of SVG-OM as well as SVG-diag. RX therapy recommended   . Hypertension   . ED (erectile dysfunction)   . COPD, severe   . Hyperlipidemia   . Stroke   . GERD (gastroesophageal reflux disease)   . Gout   . Paroxysmal atrial flutter     ablation  . Chronic combined systolic and diastolic CHF (congestive heart failure)     a. Last echo 04/2014: EF 40-45%.  Marland Kitchen PAF (paroxysmal atrial fibrillation)   . CKD (chronic kidney disease), stage III   . Tobacco abuse   . History of noncompliance with medical treatment   . Carotid artery disease     a. RCEA 2013 - followed by [facility].  . Anemia   . Lower GI bleed     a. LGIB 10/2012: Colonoscopy performed June 21 showed blood in the colon but no definite source identified, diverticular versus AVM  suspected. Aspirin stopped.  . Mitral regurgitation   . Hypertensive heart disease   . PE (pulmonary embolism)     Medications:  Scheduled:  . amiodarone  200 mg Oral BID  . antiseptic oral rinse  7 mL Mouth Rinse QID  . atorvastatin  10 mg Oral q1800  . budesonide (PULMICORT) nebulizer solution  0.5 mg Nebulization BID  . carvedilol  3.125 mg Per Tube BID WC  . chlorhexidine  15 mL Mouth Rinse BID  . free water  300 mL Per Tube 6 times per day  . Gerhardt's butt cream   Topical TID  . ipratropium-albuterol  3 mL Nebulization TID  . lipase/protease/amylase  2 capsule Oral Once   And  . sodium bicarbonate  650 mg Oral Once  . magnesium oxide  400 mg Per Tube Daily  . senna  1 tablet Oral Daily  . sodium chloride  3 mL Intravenous Q12H   Infusions:  . feeding supplement (VITAL AF 1.2 CAL) 1,000 mL (10/02/14 0131)  . heparin 1,950 Units/hr (10/02/14 0246)    Assessment: 49 yoM w/ PMH AFib, recent PE, COPD, CKD3, HFpEF admitted  5/9 with colitis and pneumosepsis from HCAP.  Complicated hospital course requiring mechanical ventilation and pressor support.  Noted thrombocytopenia starting 5/15 and HIT panel ordered, which resulted negative.  Remains in Afib with rate controlled by BBs and digoxin.  Cardiology started amiodarone today without loading dose, stopped digoxin.  To resume anticoagulation with heparin per CCM.  Continuing to hold coumadin for now per cardiology d/t thrombocytopenia, although INR now normalized.    INR supratherapeutic on admission, received multiple doses Vit K during admission; currently subtherapeutic  Prior anticoagulation: warfarin 4 mg daily with recent PE following hip surgery  Significant events: 5/18 HIT panel negative; extubated  Today, 10/02/2014:  Daily heparin level therapeutic on 1950 units/hr  CBC: Plt dropped into 50s during admission, now recovering.  Timing & nadir consistent w/ HIT but Ab panel negative.  Could be sepsis-related.  Hgb  slightly lower today; MD aware, following closely  No bleeding/issues per RN  INR subtherapeutic although mildly elevated (> 1.5)  SCr high but continues to improve; CrCl 37 ml/min  Goal of Therapy: Heparin level 0.3-0.7 units/ml Monitor platelets by anticoagulation protocol: Yes  Plan:  Continue Heparin at 1950 units/hr IV infusion  No warfarin tonight  Daily INR, CBC, heparin level  Monitor for signs of bleeding or thrombosis  Follow-up plans for TEE/cardioversion and resuming warfarin  Bernadene Personrew Verley Pariseau, PharmD Pager: [phone] 10/02/2014, 7:29 AM

## 2014-10-02 NOTE — Progress Notes (Signed)
Speech Language Pathology Treatment: Dysphagia  Patient Details Name: Nathan [name] MRN: [id number] DOB: 07/11/1940 Today's Date: 10/02/2014 Time: 1130-1250 SLP Time Calculation (min) (ACUTE ONLY): 80 min  Assessment / Plan / Recommendation Clinical Impression  Pt awake but more confused today, lethargic. Pt was not able to follow complex instruction needed for supraglottic swallow with accuracy today despite max cues over 5 trials. No progress in function observed, trials result in wet vocal quality and intermittent signs of aspiration. Proceeded with vocal modulation exercises which pt able to follow easily. Prognosis for safe PO intake very poor even with therapy. Discussed plan of care with Dr. Gonzella Lexhungel who is agreeable for repeat MBS Wednesday and will f/u with decisions regarding PO intake with known risk vs pt/family desire for long term alternate nutrition.    HPI Other Pertinent Information: 74 year old male admitted 09/19/2014 due to colitis, HCAP, sepsis. Pt deteriorated and was intubated 09/22/14. Extubated 09/27/14.  PMH significant for CVA, GERD, COPD. Orders received for swallow evaluation.   Pertinent Vitals Pain Assessment: No/denies pain  SLP Plan  Continue with current plan of care    Recommendations Diet recommendations: NPO (except for ice chips after oral care, exercises) Medication Administration: Via alternative means              Oral Care Recommendations: Oral care QID Plan: Continue with current plan of care    GO    [facility]urgcenter Of Greenbelt LLCBonnie Yee Gangi, MA CCC-SLP [phone]  Claudine MoutonDeBlois, Lakera Viall Caroline 10/02/2014, 12:10 PM

## 2014-10-03 ENCOUNTER — Inpatient Hospital Stay (HOSPITAL_COMMUNITY): Payer: Medicare PPO

## 2014-10-03 LAB — MAGNESIUM: Magnesium: 2.1 mg/dL (ref 1.7–2.4)

## 2014-10-03 LAB — GLUCOSE, CAPILLARY
GLUCOSE-CAPILLARY: 79 mg/dL (ref 65–99)
Glucose-Capillary: 87 mg/dL (ref 65–99)
Glucose-Capillary: 76 mg/dL (ref 65–99)

## 2014-10-03 LAB — BASIC METABOLIC PANEL
Anion gap: 10 (ref 5–15)
BUN: 75 mg/dL — AB (ref 6–20)
CO2: 28 mmol/L (ref 22–32)
Calcium: 8.5 mg/dL — ABNORMAL LOW (ref 8.9–10.3)
Chloride: 114 mmol/L — ABNORMAL HIGH (ref 101–111)
Creatinine, Ser: 1.66 mg/dL — ABNORMAL HIGH (ref 0.61–1.24)
GFR calc non Af Amer: 39 mL/min — ABNORMAL LOW (ref >60)
GFR, EST AFRICAN AMERICAN: 45 mL/min — AB (ref >60)
GLUCOSE: 98 mg/dL (ref 65–99)
Potassium: 3.9 mmol/L (ref 3.5–5.1)
Sodium: 152 mmol/L — ABNORMAL HIGH (ref 135–145)

## 2014-10-03 LAB — PROTIME-INR
INR: 1.54 — AB (ref 0.00–1.49)
PROTHROMBIN TIME: 18.6 s — AB (ref 11.6–15.2)

## 2014-10-03 LAB — CBC
HCT: 26.8 % — ABNORMAL LOW (ref 39.0–52.0)
HEMOGLOBIN: 7.5 g/dL — AB (ref 13.0–17.0)
MCH: 24.8 pg — ABNORMAL LOW (ref 26.0–34.0)
MCHC: 28 g/dL — ABNORMAL LOW (ref 30.0–36.0)
MCV: 88.7 fL (ref 78.0–100.0)
PLATELETS: 94 10*3/uL — AB (ref 150–400)
RBC: 3.02 MIL/uL — ABNORMAL LOW (ref 4.22–5.81)
RDW: 24.1 % — AB (ref 11.5–15.5)
WBC: 8.2 10*3/uL (ref 4.0–10.5)

## 2014-10-03 LAB — HEPARIN LEVEL (UNFRACTIONATED): Heparin Unfractionated: 0.65 IU/mL (ref 0.30–0.70)

## 2014-10-03 MED ORDER — FUROSEMIDE 10 MG/ML IJ SOLN
20.0000 mg | Freq: Once | INTRAMUSCULAR | Status: AC
  Administered 2014-10-03: 20 mg via INTRAVENOUS
  Filled 2014-10-03: qty 2

## 2014-10-03 MED ORDER — FUROSEMIDE 10 MG/ML IJ SOLN
40.0000 mg | Freq: Two times a day (BID) | INTRAMUSCULAR | Status: DC
Start: 2014-10-03 — End: 2014-10-03
  Administered 2014-10-03: 40 mg via INTRAVENOUS
  Filled 2014-10-03: qty 4

## 2014-10-03 MED ORDER — AMIODARONE HCL IN DEXTROSE 360-4.14 MG/200ML-% IV SOLN
30.0000 mg/h | INTRAVENOUS | Status: DC
  Administered 2014-10-03: 30 mg/h via INTRAVENOUS
  Filled 2014-10-03 (×2): qty 200

## 2014-10-03 MED ORDER — METOPROLOL TARTRATE 1 MG/ML IV SOLN
2.5000 mg | Freq: Four times a day (QID) | INTRAVENOUS | Status: DC
  Administered 2014-10-03: 2.5 mg via INTRAVENOUS
  Filled 2014-10-03: qty 5

## 2014-10-03 MED ORDER — MORPHINE SULFATE 2 MG/ML IJ SOLN
2.0000 mg | Freq: Once | INTRAMUSCULAR | Status: AC
  Administered 2014-10-03: 2 mg via INTRAVENOUS
  Filled 2014-10-03: qty 1

## 2014-10-03 MED ORDER — MORPHINE SULFATE 2 MG/ML IJ SOLN
1.0000 mg | INTRAMUSCULAR | Status: DC | PRN
  Administered 2014-10-03: 1 mg via INTRAVENOUS
  Filled 2014-10-03: qty 1

## 2014-10-04 ENCOUNTER — Ambulatory Visit: Payer: Medicare PPO | Admitting: Family

## 2014-10-11 NOTE — Progress Notes (Signed)
Nutrition Follow-up  DOCUMENTATION CODES:  Non-severe (moderate) malnutrition in context of chronic illness  INTERVENTION: - Vital AF 1.2 @ 65 mL/hr with 300 mL free water Q4h ordered (1872 kcal, 117 grams protein, and 3065 mL free water) - Nutrition route per outcome of Palliative care consult and meeting with family - RD to continue to monitor for needs; RD to manage TF   NUTRITION DIAGNOSIS:  Inadequate oral intake related to inability to eat as evidenced by NPO status. -ongoing  GOAL:  Patient will meet greater than or equal to 90% of their needs -currently unmet; TF regimen would meet needs  MONITOR:  Labs, Weight trends, TF tolerance, Skin, I & O's   ASSESSMENT: 74 year old male presented to ED 5/9 c/o constipation x 3 days. Admitted for sepsis 2nd to colitis/HCAP. Initially with elevated lactic. AF RVR on coumadin. INR > 10 at presentation. Had been improving until 5/12 early am with rapid deterioration with obtundation, bradycardia, and hypoxia (50% O2 sat). Intubated by CRNA for respiratory insufficiency.  Pt has had failed swallowed evaluations since extubation with the most recent being yesterday (10/02/14). On 5/21 pt pulled [name] and it was replaced at that time. He again pulled it yesterday and Karie Sodaanda has remained out since that time due to bradycardia with attempted replacement. Palliative care consult placed this AM and pt now DNR.   Pt unable to meet needs at this time. Will monitor for POC and family decision. Medications reviewed. Labs reviewed; CBGs: 76-113 mg/dL, Na: 119152 mmol/L, Cl: 147114 mmol/L, BUN/creatinine elevated, GFR: 45.  Height:  Ht Readings from Last 1 Encounters:  09/24/14 6\' 1"  (1.854 m)    Weight:  Wt Readings from Last 1 Encounters:  November 16, 2014 186 lb 9.6 oz (84.641 kg)    Ideal Body Weight:  83.6 kg  Wt Readings from Last 10 Encounters:  November 16, 2014 186 lb 9.6 oz (84.641 kg)  08/23/14 162 lb (73.483 kg)  08/14/14 156 lb 6.4 oz (70.943 kg)   08/11/14 157 lb 12.8 oz (71.578 kg)  07/31/14 167 lb 1.6 oz (75.796 kg)  07/11/14 165 lb (74.844 kg)  06/23/14 186 lb (84.369 kg)  06/20/14 200 lb (90.719 kg)  06/18/14 187 lb 12.8 oz (85.186 kg)  06/06/14 183 lb (83.008 kg)    BMI:  Body mass index is 24.62 kg/(m^2).  Estimated Nutritional Needs:  Kcal:  1800-2000  Protein:  110-115 g  Fluid:  1.7 L/day  Skin:  Wound (see comment) (Stage II wound on buttocks)  Diet Order:  Diet NPO time specified  EDUCATION NEEDS:  Education needs no appropriate at this time   Intake/Output Summary (Last 24 hours) at November 16, 2014 1156 Last data filed at November 16, 2014 [id number]  Gross per 24 hour  Intake 963.08 ml  Output   1980 ml  Net -1016.92 ml    Last BM:  5/23   Trenton GammonJessica Margie Urbanowicz, RD, LDN Inpatient Clinical Dietitian Pager # [phone] After hours/weekend pager # [phone]

## 2014-10-11 NOTE — Progress Notes (Signed)
Pt had 12 beats wide QRS. VSS. On call MD text paged with findings. Pt asymptomatic. Will continue to monitor.

## 2014-10-11 NOTE — Care Management Note (Signed)
Case Management Note  Patient Details  Name: Nathan [name] MRN: [id number] Date of Birth: 01/20/1941  Subjective/Objective:                    Action/Plan:   Expected Discharge Date:   (UNKNOWN)               Expected Discharge Plan:  Skilled Nursing Facility  In-House Referral:     Discharge planning Services  CM Consult  Post Acute Care Choice:    Choice offered to:     DME Arranged:    DME Agency:     HH Arranged:    HH Agency:     Status of Service:  In process, will continue to follow  Medicare Important Message Given:  Yes Date Medicare IM Given:  09/16/2014 Medicare IM give by:  Lanier ClamMahabir, Jodine Muchmore Date Additional Medicare IM Given:    Additional Medicare Important Message give by:     If discussed at Long Length of Stay Meetings, dates discussed: 09/27/2014   Additional Comments:  Lanier ClamMahabir, Brach Birdsall, RN 10/09/2014, 3:14 PM

## 2014-10-11 NOTE — Discharge Summary (Signed)
Physician Discharge Summary  Nathan [name] QPR:[id number] DOB: [birth year] DOA: 09/12/2014  PCP: Irven Shelling, MD  Admit date: 10/09/2014 Discharge date: 10-30-2014  Time spent: 25 minutes  Patient expired on Oct 30, 2014 at 3:35 pm  Discharge Diagnoses:  Principal Problem: Septic shock   Active Problems:   HCAP (healthcare-associated pneumonia)   Colitis   Constipation   AKI (acute kidney injury)   CKD (chronic kidney disease)   Acute systolic congestive heart failure   HLD (hyperlipidemia)   GERD without esophagitis   Shock   Malnutrition of moderate degree   Acute respiratory failure with hypoxia   Shock circulatory   ATN (acute tubular necrosis)   Mitral regurgitation   Abdominal pain   Paroxysmal atrial fibrillation   Pleural effusion, right   Dysphagia, pharyngoesophageal phase   Acute kidney injury     Filed Weights   10/01/14 0515 10/02/14 0415 October 30, 2014 0355  Weight: 87.635 kg (193 lb 3.2 oz) 87.5 kg (192 lb 14.4 oz) 84.641 kg (186 lb 9.6 oz)    History of present illness:  74 year old male with history of paroxysmal A. fib on Coumadin, goal C COPD, CAD, hypertension, systolic and diastolic CHF, chronic kidney disease stage III and history of PE was recently hospitalized for hip fracture that was managed conservatively. During the admission he was found to have PE and discharged on Coumadin. Pt admitted with sepsis and HCAP, shortly requiring intubation with prolonged ICU stay.  Transferred to medial floor but with severe deconditioning and on going medical issues.   Hospital Course:  Hospital course 5/9: patient was admitted to the hospitalist service with colitis, sepsis due to healthcare associated pneumonia. 5/12: Patient became bradycardic, hypoxic and obtunded, intubated and transferred to ICU. 5/13: Had rapid deterioration with persistent bradycardia and found to be in septic shock requiring pressors. Worsened renal function. 5/14: Maintain on  pressors. Echo done showed severe MR. 5/15: Drop in platelets (68) , weaned pressors but failed extubation. Started on tube feeds. 5/16:off pressors but failed spontaneous breathing trial. Started on Lasix drip and narrowed antibiotics. 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 Recommended patient will not tolerate surgery at this time and TEE may not contribute much. Added digoxin. 5/17: Patient more awake. Added Precedex. Switch ceftriaxone to Levaquin as patient . lasix dose increased . 5/18: Renal function worsened so Lasix drip was stopped. Right-sided thoracentesis done for pleural effusion with 1000 mL transited to fluid removed. Follow-up chest x-ray showed a small medial pneumothorax (ex vacuo). Patient was successfully extubated. 5/19: Stable ICU. Failed swallow evaluation. 5/20: Effusion returned. Lasix was added back. Placed panda tube. Pt hypernatremic and started on free water. Antibiotic discontinued. 5/21: Patient transferred hospitalist service on telemetry. Patient pulled out panda tube which was replaced. 5/23: Patient becoming more confused, pulled out panda again. Moaning with discomfort and given some morphine. Discussed with wife and son over the phone about goals of care and involving palliative care discussions. Both agree.  Patient not a candidate for papillary muscle repair. He was receiving prn IV lasix. Continued on panda tube for feeding.  since 5/23 hew as getting more lethargic and confused. On 5/24 he pulled his panda tube and became lethargic and short of breath. After discussion with wife and son about his poor prognosis palliative care was consulted.   During the day patient was increasingly lethargic requiring prn morphine. He then became bradycardic and hypotensive. Son was called  and wished to make patient comfortable.  Patient pronounced dead at 3:45 pm. Wife was at bedside  shortly.            Consultants:  PCCM  cardiology  Procedures:  Intubation   2D ECHO  Antibiotics:  Completed on 5/20     Discharge Instructions       No Known Allergies    The results of significant diagnostics from this hospitalization (including imaging, microbiology, ancillary and laboratory) are listed below for reference.    Significant Diagnostic Studies: Ct Abdomen Pelvis Wo Contrast  09/13/2014   CLINICAL DATA:  Constipation for 5 days and bilateral leg swelling for 3 weeks. History of COPD and PE. No IV or oral contrast material per order of the referring physician.  EXAM: CT ABDOMEN AND PELVIS WITHOUT CONTRAST  TECHNIQUE: Multidetector CT imaging of the abdomen and pelvis was performed following the standard protocol without IV contrast.  COMPARISON:  None.  FINDINGS: Small bilateral pleural effusions, greater on the right. Basilar atelectasis. Consolidation in the right lower lung posteriorly probably represents pneumonia. Postoperative changes in the mediastinum. Mild cardiac enlargement.  Small amount of free fluid in the abdomen and extending along the pericolic gutters into the pelvis. This is likely ascites. Increased density in the gallbladder may represent sludge or milk of calcium. No bile duct dilatation. Unenhanced appearance of the liver, spleen, pancreas, adrenal glands, inferior vena cava, and retroperitoneal lymph nodes is unremarkable. Calcifications in both kidneys probably representing vascular calcification. No hydronephrosis or hydroureter. Calcification throughout the abdominal aorta without aneurysm. Stenosis at the aortic bifurcation is probable. Vascular stents in the iliac arteries. Stomach, small bowel, and colon are not abnormally distended. Stool fills the colon. No free air in the abdomen.  Pelvis: Stool-filled rectosigmoid colon. There is some stranding around the sigmoid region which may indicate early changes of acute  diverticulitis. No abscess. Bladder wall is not thickened. Prostate gland is not enlarged. Appendix is normal. Small left inguinal hernia containing fat and fluid. Degenerative changes in the spine and hips. Old fracture deformity of the right inferior pubic ramus.  IMPRESSION: Small bilateral pleural effusions greater on the right. Consolidation in the right lower lung suggesting pneumonia. Small amount of abdominal and pelvic free fluid. Increased density suggesting sludge versus milk of calcium in the gallbladder. Mild stranding around the sigmoid colon suggesting early diverticulitis. No abscess. Left inguinal hernia containing fat and fluid.   Electronically Signed   By: Lucienne Capers M.D.   On: 10/06/2014 23:34   Dg Abd 1 View  09/30/2014   CLINICAL DATA:  Encounter for nasogastric tube placement.  EXAM: ABDOMEN - 1 VIEW  COMPARISON:  One day prior at 1038 hour  FINDINGS: Two portable AP supine views submitted. Initial image demonstrates weighted feeding tube tip in the mid chest in the region of the mid-esophagus. Subsequent image demonstrates advancement of the feeding tube with tip below the diaphragm in the stomach. There is contrast within the colon from prior modified barium swallow.  IMPRESSION: Tip of the weighted enteric tube in the stomach after advancement.   Electronically Signed   By: Jeb Levering M.D.   On: 09/30/2014 01:28   Dg Abd 1 View  09/29/2014   CLINICAL DATA:  Feeding tube placement  EXAM: ABDOMEN - 1 VIEW  COMPARISON:  Portable exam 1038 hours compared to 10/01/2014  FINDINGS: Tip of feeding tube at gastric fundus.  Nonobstructive bowel gas pattern.  Small amount of contrast retained in distal  ileum.  Air-filled colon, upper normal caliber.  RIGHT pleural effusion and LEFT lower lobe infiltrate identified.  IMPRESSION: Tip feeding tube coiled in proximal stomach with tip at fundus.   Electronically Signed   By: Lavonia Dana M.D.   On: 09/29/2014 11:03   Dg Chest Port 1  View  2014/10/06   CLINICAL DATA:  Aspiration.  Congestion.  EXAM: PORTABLE CHEST - 1 VIEW  COMPARISON:  09/30/2014  FINDINGS: Left IJ central line, tip at the SVC level. The feeding tube has been removed.  Moderate bilateral pleural effusions which appear layering. There is a increased opacity in the left mid chest which favors fissural fluid due to position and angular medial/ inferior margins. No definite edema or new airspace disease.  Chronic cardiomegaly. The patient is status post CABG. Negative aortic and hilar contours.  IMPRESSION: 1. Moderate bilateral pleural effusion, increased from 09/30/2014. 2. New left chest opacity favors fissural fluid over airspace disease.   Electronically Signed   By: Monte Fantasia M.D.   On: 2014/10/06 03:20   Dg Chest Port 1 View  09/30/2014   CLINICAL DATA:  Followup right pleural effusion.  EXAM: PORTABLE CHEST - 1 VIEW  COMPARISON:  Yesterday.  FINDINGS: No significant change in a small to moderate size right pleural effusion and right basilar airspace opacity. Mild increase in size of a small left pleural effusion. Stable enlarged cardiac silhouette. Mild diffuse increase in prominence of the interstitial markings. Stable post CABG changes. Feeding tube extending into the stomach. Left jugular catheter tip in the superior vena cava. No pneumothorax.  IMPRESSION: 1. Mildly increased interstitial pulmonary edema. 2. Mild increased small left pleural effusion. 3. Stable small to moderate-sized right pleural effusion and associated right basilar probable atelectasis. 4. Stable cardiomegaly.   Electronically Signed   By: Claudie Revering M.D.   On: 09/30/2014 09:27   Dg Chest Port 1 View  09/29/2014   CLINICAL DATA:  Respiratory failure, effusions.  EXAM: PORTABLE CHEST - 1 VIEW  COMPARISON:  Yesterday at 0543 hour  FINDINGS: Tip of the left central line remains in the SVC. Cardiomegaly is unchanged. Increased right pleural effusion with increasing fluid in the fissure.  Lucency adjacent to the right heart border may be related to aerated lung adjacent to pleural effusions versus pneumothorax, this has minimally progressed from prior. Left pleural effusion is unchanged. Bibasilar opacities are likely related to compressive atelectasis. Pulmonary vasculature is normal. There is increasing bilateral perihilar atelectasis.  IMPRESSION: 1. Increasing right pleural effusion with increasing fluid in the fissure. Lucency about the right heart border, may reflect aerated lung adjacent to pleural fluid versus small pneumothorax. 2. Unchanged left pleural effusion. 3. Increasing bilateral perihilar atelectasis.   Electronically Signed   By: Jeb Levering M.D.   On: 09/29/2014 05:20   Dg Chest Port 1 View  09/28/2014   CLINICAL DATA:  Pleural effusion.  EXAM: PORTABLE CHEST - 1 VIEW  COMPARISON:  09/27/2014.  FINDINGS: Left IJ line noted in good anatomic position. Mediastinum hilar structures normal. Cardiomegaly with normal pulmonary vascularity. Prior CABG. Interim slight improvement of bibasilar atelectasis and/or infiltrates. Interim slight improvement of bilateral pleural effusions. Persistent but improved questionable medial right base pneumothorax.  IMPRESSION: 1. Left IJ line in stable position. 2. Prior CABG.Stable cardiomegaly.  Pulmonary vascularity normal . 3. Slight improvement of bibasilar atelectasis and/or infiltrates. Slight improvement of bilateral pleural effusions. 4. Slight improvement of questionable medial right base pneumothorax.   Electronically Signed   By:  Banner   On: 09/28/2014 07:34   Dg Chest Port 1 View  09/27/2014   CLINICAL DATA:  RIGHT pneumothorax, hypertension, COPD  EXAM: PORTABLE CHEST - 1 VIEW  COMPARISON:  Portable exam 1858 hours compared to 1555 hours  FINDINGS: LEFT jugular central venous catheter with tip projecting over SVC.  Enlargement of cardiac silhouette post CABG.  Mediastinal contours stable with minimal atherosclerotic  calcification aorta.  RIGHT basilar effusion and question loculated medial basilar pneumothorax unchanged.  Bibasilar atelectasis.  Tiny LEFT effusion.  No new infiltrate or acute osseous findings.  IMPRESSION: Bibasilar effusions and atelectasis with suspect small loculated medial basilar RIGHT pneumothorax, unchanged.   Electronically Signed   By: Lavonia Dana M.D.   On: 09/27/2014 19:24   Dg Chest Port 1 View  09/27/2014   CLINICAL DATA:  Status post right thoracentesis.  EXAM: PORTABLE CHEST - 1 VIEW  COMPARISON:  Single view of the chest earlier this same day.  FINDINGS: The patient's endotracheal tube and NG tube have been removed. Left IJ catheter remains in place.  Right pleural effusion is decreased in size after thoracentesis. There is a new lenticular lucency projecting over the right lower chest suspicious for a small anterior pneumothorax. Small left pleural effusion is again seen. There is no left pneumothorax. Bibasilar atelectasis is noted.  IMPRESSION: Decreased right pleural effusion after thoracentesis with a likely small anterior pneumothorax identified.  No change in a small left pleural effusion and basilar atelectasis.  Status post extubation and NG tube removal.  Critical Value/emergent results were called by telephone at the time of interpretation on 09/27/2014 at 4:14 pm to Avon Gully, R.N., who verbally acknowledged these results.   Electronically Signed   By: Inge Rise M.D.   On: 09/27/2014 16:15   Dg Chest Port 1 View  09/27/2014   CLINICAL DATA:  SOB today; f/u right pleural effusion  EXAM: PORTABLE CHEST - 1 VIEW  COMPARISON:  the previous day's study  FINDINGS: Endotracheal tube, nasogastric tube, left IJ central line stable in position. Previous CABG. Stable mild cardiomegaly. Bilateral pleural effusions right greater than left unchanged. Patchy atelectasis or infiltrates in both lung bases persist. Mild central pulmonary vascular congestion.  IMPRESSION: 1. Stable  pleural effusions and bilateral infiltrates or edema. 2.  Support hardware stable in position.   Electronically Signed   By: Lucrezia Europe M.D.   On: 09/27/2014 09:03   Dg Chest Port 1 View  09/26/2014   CLINICAL DATA:  Hospital admission for heart failure. Hypertension. COPD.  EXAM: PORTABLE CHEST - 1 VIEW  COMPARISON:  09/24/2014 and 09/22/2014  FINDINGS: Nasogastric tube courses into the stomach and off the inferior portion of the film as tip is not visualized. Endotracheal tube has tip approximately 3.5 cm above the carina. Left IJ central venous catheter is unchanged with tip over the SVC.  Lungs are adequately inflated with persistent moderate size right pleural effusion and small left pleural effusion. Slight worsening airspace opacification over the right mid to lower lung and stable opacification over the left mid to lower lung. Findings likely represent combination of atelectasis as well as vascular congestion, although cannot exclude infection. Cardiomediastinal silhouette and remainder the exam is unchanged.  IMPRESSION: Slight worsening opacification over the right mid to lower lung with stable opacification of the left mid to lower lung. Findings likely represent combination of atelectasis and vascular congestion although cannot exclude infection. Persistent moderate size right effusion and small left effusion.  Tubes  and lines as described.   Electronically Signed   By: Marin Olp M.D.   On: 09/26/2014 09:57   Dg Chest Port 1 View  09/24/2014   CLINICAL DATA:  Endotracheal tube present.  EXAM: PORTABLE CHEST - 1 VIEW  COMPARISON:  Sep 23, 2014.  FINDINGS: Stable cardiomediastinal silhouette. Status post coronary artery bypass graft. Endotracheal tube is in grossly good position with distal tip 4 cm above the carina. Left internal jugular catheter is unchanged with distal tip overlying expected position of the SVC. Stable bilateral pleural effusions are noted with right greater than left. Stable  bilateral lower lobe opacities are also noted. Bony thorax is intact.  IMPRESSION: Stable bilateral lung opacities and pleural effusions. Stable support apparatus.   Electronically Signed   By: Marijo Conception, M.D.   On: 09/24/2014 07:16   Dg Chest Port 1 View  09/23/2014   CLINICAL DATA:  Pneumonia  EXAM: PORTABLE CHEST - 1 VIEW  COMPARISON:  09/22/2014  FINDINGS: Bilateral pleural effusions are stable. Left midlung airspace disease is improved. Tubular device is stable. No pneumothorax.  IMPRESSION: Improved left midlung airspace disease.  Stable bilateral pleural effusions.   Electronically Signed   By: Marybelle Killings M.D.   On: 09/23/2014 08:12   Dg Chest Port 1 View  09/22/2014   CLINICAL DATA:  Central line placement  EXAM: PORTABLE CHEST - 1 VIEW  COMPARISON:  09/22/2014  FINDINGS: Endotracheal tube with tip measuring 4.5 cm above the carinal. Enteric tube tip is off the field of view. Left central venous catheter with tip over the upper SVC region. No pneumothorax. Bilateral pleural effusions greater on the right with basilar atelectasis. Suggestion of developing airspace disease in the left mid lung possibly representing pneumonia. Calcified and tortuous aorta.  IMPRESSION: Appliances appear in satisfactory location. Bilateral pleural effusions, greater on the right. Suggestion of developing infiltration in the left mid lung.   Electronically Signed   By: Lucienne Capers M.D.   On: 09/22/2014 05:03   Dg Chest Port 1 View  09/22/2014   CLINICAL DATA:  Endotracheal tube placement  EXAM: PORTABLE CHEST - 1 VIEW  COMPARISON:  09/30/2014  FINDINGS: Interval placement of an endotracheal tube with tip measuring 4.1 cm above the carina. Postoperative changes in the mediastinum. Cardiac enlargement with mild pulmonary vascular congestion. Bilateral pleural effusions with basilar atelectasis. Mediastinal contours appear intact. Calcification of aorta.  IMPRESSION: Endotracheal tube appears in satisfactory  position. Cardiac enlargement with mild pulmonary vascular congestion. Bilateral pleural effusions and basilar atelectasis.   Electronically Signed   By: Lucienne Capers M.D.   On: 09/22/2014 02:30   Dg Abd Acute W/chest  09/16/2014   CLINICAL DATA:  Constipation for 5 days. Bilateral leg swelling for 3 weeks. Abdominal pain. History of COPD and PE.  EXAM: DG ABDOMEN ACUTE W/ 1V CHEST  COMPARISON:  Chest 07/10/2014.  Abdomen 07/04/2014.  FINDINGS: Postoperative changes in the mediastinum. Mild cardiac enlargement without vascular congestion. Small bilateral pleural effusions, greater on the right. Bilateral basilar atelectasis. Emphysematous changes in the lungs. No pneumothorax. Calcification of the aorta.  Interval resolution of previous colonic ileus. Diffusely stool-filled colon. No small or large bowel distention. No free intra-abdominal air. No abnormal air-fluid levels. Vascular stents in vascular calcifications present. Degenerative changes in the spine and hips.  IMPRESSION: Cardiac enlargement. Bilateral pleural effusions and basilar atelectasis, greater on the right. Normal nonobstructive bowel gas pattern.   Electronically Signed   By: Oren Beckmann.D.  On: 09/25/2014 22:34   Dg Abd Portable 1v  09/30/2014   CLINICAL DATA:  Feeding tube advancement.  EXAM: PORTABLE ABDOMEN - 1 VIEW  COMPARISON:  Film at 1037 hours  FINDINGS: Film at 1226 hours shows further advancement of the top off feeding tube with the tip now located in the region of the distal antrum of the stomach.  IMPRESSION: Feeding tube tip now in the distal gastric antrum.   Electronically Signed   By: Aletta Edouard M.D.   On: 09/30/2014 12:41   Dg Abd Portable 1v  09/30/2014   CLINICAL DATA:  Feeding tube placement.  EXAM: PORTABLE ABDOMEN - 1 VIEW  COMPARISON:  Study at 0050 hours  FINDINGS: Feeding tube shows further advancement with the tip now in the proximal stomach at the level of the fundus.  IMPRESSION: Feeding tube  tip now in the proximal stomach.   Electronically Signed   By: Aletta Edouard M.D.   On: 09/30/2014 11:02   Dg Swallowing Func-speech Pathology  09/28/2014    Objective Swallowing Evaluation:    Patient Details  Name: TYEE VANDEVOORDE MRN: [id number] Date of Birth: [birth year]  Today's Date: 09/28/2014 Time: SLP Start Time (ACUTE ONLY): 1400-SLP Stop Time (ACUTE ONLY): 1425 SLP Time Calculation (min) (ACUTE ONLY): 25 min  Past Medical History:  Past Medical History  Diagnosis Date  . Aorto-iliac disease     a. s/p bilat with stent 2007 - followed by [facility].  . CAD (coronary artery disease)     a. 10/2011 s/p CABG x4 (LIMA-LAD, SVG-Diag, SVG-OM1 and SVG-PDA)  b.  03/2012 NSTEMI s/p LHC SVG-RCA and LIMA-LAD but total occlusion of SVG-OM  as well as SVG-diag. RX therapy recommended   . Hypertension   . ED (erectile dysfunction)   . COPD, severe   . Hyperlipidemia   . Stroke   . GERD (gastroesophageal reflux disease)   . Gout   . Paroxysmal atrial flutter     ablation  . Chronic combined systolic and diastolic CHF (congestive heart failure)     a. Last echo 04/2014: EF 40-45%.  Marland Kitchen PAF (paroxysmal atrial fibrillation)   . CKD (chronic kidney disease), stage III   . Tobacco abuse   . History of noncompliance with medical treatment   . Carotid artery disease     a. RCEA 2013 - followed by [facility].  . Anemia   . Lower GI bleed     a. LGIB 10/2012: Colonoscopy performed June 21 showed blood in the colon  but no definite source identified, diverticular versus AVM suspected.  Aspirin stopped.  . Mitral regurgitation   . Hypertensive heart disease   . PE (pulmonary embolism)    Past Surgical History:  Past Surgical History  Procedure Laterality Date  . Spinal fusion    . Hand surgery    . Neck fusion  1975  . Eye surgery    . Coronary artery bypass graft  10/31/2011    Procedure: CORONARY ARTERY BYPASS GRAFTING (CABG);  Surgeon: Grace Isaac, MD;  Location: [place];  Service: Open Heart Surgery;  Laterality:  N/A;  times three using   Left Internal Mammary Artery and Right Greater  Saphenous Vein Graft harvested endoscopically; TEE  . Endarterectomy  10/31/2011    Procedure: ENDARTERECTOMY CAROTID;  Surgeon: Serafina Mitchell, MD;   Location: [facility] OR;  Service: Vascular;  Laterality: Right;  with patch  angioplasty   . Carotid endarterectomy  10-31-2011    right  .  Colonoscopy N/A 10/30/2012    Procedure: COLONOSCOPY;  Surgeon: Lear Ng, MD;  Location: [facility]  ENDOSCOPY;  Service: Endoscopy;  Laterality: N/A;  . Left heart catheterization with coronary angiogram N/A 09/04/2011    Procedure: LEFT HEART CATHETERIZATION WITH CORONARY ANGIOGRAM;  Surgeon:  Sinclair Grooms, MD;  Location: [facility] CATH LAB;  Service: Cardiovascular;   Laterality: N/A;  . Left heart cath N/A 04/03/2012    Procedure: LEFT HEART CATH;  Surgeon: Troy Sine, MD;  Location: [facility]  CATH LAB;  Service: Cardiovascular;  Laterality: N/A;  . Pelvic fracture surgery     HPI:  Other Pertinent Information: 74 year old male admitted 10/05/2014 due to  colitis, HCAP, sepsis. Pt deteriorated and was intubated 09/22/14.  Extubated 09/27/14.  PMH significant for CVA, GERD, COPD. Orders received  for swallow evaluation.  No Data Recorded  Assessment / Plan / Recommendation CHL IP CLINICAL IMPRESSIONS 09/28/2014  Therapy Diagnosis (None)  Clinical Impression Pt demonstrates a severe oropharyngeal dysphagia with  silent penetration and aspiration of all consistencies tested. There is  reduced laryngeal closure during the swallow with all boluses reaching the  vocal folds. Hyolaryngeal excursion is particularly limited resulting in  poor opening of UES. Penetrate and residuals present post swallow are  silently penetrated/aspirated despite cues for multiple swallows and  throat clears. A chin tuck did slightly improve UES opening, but not  enough for any texture to be safely consumed.  Suspect pt likely had a  structural dysphagia prior to admission given curvature of spine and  suspected impact on  UES function.  Recommend pt remain NPO with laryngeal  closure exercises and ongoing PO trials. Consider short term method of  nutrition.       CHL IP TREATMENT RECOMMENDATION 09/28/2014  Treatment Recommendations Therapy as outlined in treatment plan below     CHL IP DIET RECOMMENDATION 09/28/2014  SLP Diet Recommendations Alternative means - temporary  Liquid Administration via (None)  Medication Administration Via alternative means  Compensations (None)  Postural Changes and/or Swallow Maneuvers (None)     CHL IP OTHER RECOMMENDATIONS 09/28/2014  Recommended Consults (None)  Oral Care Recommendations Oral care QID  Other Recommendations (None)     CHL IP FOLLOW UP RECOMMENDATIONS 06/15/2014  Follow up Recommendations None     CHL IP FREQUENCY AND DURATION 09/28/2014  Speech Therapy Frequency (ACUTE ONLY) min 3x week  Treatment Duration 2 weeks     Pertinent Vitals/Pain NA    SLP Swallow Goals CHL IP SWALLOW STUDY GOALS 11/14/2011  Patient will consume recommended diet without observed clinical signs of  aspiration with Supervision/safety  Swallow Study Goal #1 - Progress Not Met  Patient will utilize recommended strategies during swallow to increase  swallowing safety with Supervision/safety  Swallow Study Goal #2 - Progress Not met  Goal #3 (None)  Swallow Study Goal #3 - Progress (None)  Goal #4 (None)  Swallow Study Goal #4 - Progress (None)    No flowsheet data found.    CHL IP REASON FOR REFERRAL 09/28/2014  Reason for Referral Objectively evaluate swallowing function     CHL IP ORAL PHASE 09/28/2014  Lips (None)  Tongue (None)  Mucous membranes (None)  Nutritional status (None)  Other (None)  Oxygen therapy (None)  Oral Phase Impaired  Oral - Pudding Teaspoon (None)  Oral - Pudding Cup (None)  Oral - Honey Teaspoon (None)  Oral - Honey Cup (None)  Oral - Honey Syringe (None)  Oral - Nectar  Teaspoon (None)  Oral - Nectar Cup (None)  Oral - Nectar Straw (None)  Oral - Nectar Syringe (None)  Oral - Ice Chips (None)   Oral - Thin Teaspoon (None)  Oral - Thin Cup (None)  Oral - Thin Straw (None)  Oral - Thin Syringe (None)  Oral - Puree (None)  Oral - Mechanical Soft (None)  Oral - Regular (None)  Oral - Multi-consistency (None)  Oral - Pill (None)  Oral Phase - Comment (None)      CHL IP PHARYNGEAL PHASE 09/28/2014  Pharyngeal Phase Impaired  Pharyngeal - Pudding Teaspoon (None)  Penetration/Aspiration details (pudding teaspoon) (None)  Pharyngeal - Pudding Cup (None)  Penetration/Aspiration details (pudding cup) (None)  Pharyngeal - Honey Teaspoon (None)  Penetration/Aspiration details (honey teaspoon) (None)  Pharyngeal - Honey Cup (None)  Penetration/Aspiration details (honey cup) (None)  Pharyngeal - Honey Syringe (None)  Penetration/Aspiration details (honey syringe) (None)  Pharyngeal - Nectar Teaspoon (None)  Penetration/Aspiration details (nectar teaspoon) (None)  Pharyngeal - Nectar Cup (None)  Penetration/Aspiration details (nectar cup) (None)  Pharyngeal - Nectar Straw (None)  Penetration/Aspiration details (nectar straw) (None)  Pharyngeal - Nectar Syringe (None)  Penetration/Aspiration details (nectar syringe) (None)  Pharyngeal - Ice Chips (None)  Penetration/Aspiration details (ice chips) (None)  Pharyngeal - Thin Teaspoon (None)  Penetration/Aspiration details (thin teaspoon) (None)  Pharyngeal - Thin Cup (None)  Penetration/Aspiration details (thin cup) (None)  Pharyngeal - Thin Straw (None)  Penetration/Aspiration details (thin straw) (None)  Pharyngeal - Thin Syringe (None)  Penetration/Aspiration details (thin syringe') (None)  Pharyngeal - Puree (None)  Penetration/Aspiration details (puree) (None)  Pharyngeal - Mechanical Soft (None)  Penetration/Aspiration details (mechanical soft) (None)  Pharyngeal - Regular (None)  Penetration/Aspiration details (regular) (None)  Pharyngeal - Multi-consistency (None)  Penetration/Aspiration details (multi-consistency) (None)  Pharyngeal - Pill (None)   Penetration/Aspiration details (pill) (None)  Pharyngeal Comment (None)      CHL IP CERVICAL ESOPHAGEAL PHASE 09/28/2014  Cervical Esophageal Phase Impaired  Pudding Teaspoon (None)  Pudding Cup (None)  Honey Teaspoon (None)  Honey Cup (None)  Honey Straw (None)  Nectar Teaspoon (None)  Nectar Cup (None)  Nectar Straw (None)  Nectar Sippy Cup (None)  Thin Teaspoon (None)  Thin Cup (None)  Thin Straw (None)  Thin Sippy Cup (None)  Cervical Esophageal Comment decreased opening of cervical esophagus.     No flowsheet data found.         DeBlois, Katherene Ponto 09/28/2014, 2:50 PM     Microbiology: Recent Results (from the past 240 hour(s))  Body fluid culture     Status: None   Collection Time: 09/27/14  3:50 PM  Result Value Ref Range Status   Specimen Description PLEURAL  Final   Special Requests NONE  Final   Gram Stain   Final    RARE WBC PRESENT, PREDOMINANTLY MONONUCLEAR NO ORGANISMS SEEN Performed at [facility]    Culture   Final    NO GROWTH 3 DAYS Performed at [facility]    Report Status 10/01/2014 FINAL  Final     Labs: Basic Metabolic Panel:  Recent Labs Lab 09/26/14 2211 09/27/14 0410  09/29/14 0431 09/30/14 0133 10/01/14 0544 10/02/14 0437 2014/10/23 0509  NA  --   --   < > 154* 153* 152* 153* 152*  K  --   --   < > 3.5 3.2* 3.8 4.0 3.9  CL  --   --   < > 117* 116* 116* 116* 114*  CO2  --   --   < > '26 26 27 28 28  ' GLUCOSE  --   --   < > 101* 86 116* 111* 98  BUN  --   --   < > 77* 67* 69* 71* 75*  CREATININE  --   --   < > 2.15* 2.16* 1.95* 1.75* 1.66*  CALCIUM  --   --   < > 8.7* 8.9 8.5* 8.3* 8.5*  MG 2.0 2.0  --   --   --   --   --  2.1  PHOS  --  3.0  --  4.1  --   --   --   --   < > = values in this interval not displayed. Liver Function Tests:  Recent Labs Lab 09/27/14 1645 09/30/14 0133  AST  --  31  ALT  --  66*  ALKPHOS  --  85  BILITOT  --  1.8*  PROT 6.3* 6.3*  ALBUMIN  --  2.2*   No results for input(s): LIPASE, AMYLASE  in the last 168 hours. No results for input(s): AMMONIA in the last 168 hours. CBC:  Recent Labs Lab 09/27/14 0410  09/29/14 0431 09/30/14 0133 10/01/14 0544 10/02/14 0437 10-11-14 0509  WBC 4.4  < > 4.3 5.1 6.3 6.8 8.2  NEUTROABS 2.9  --   --   --   --   --   --   HGB 9.0*  < > 8.9* 9.2* 8.4* 7.5* 7.5*  HCT 30.9*  < > 31.0* 32.5* 29.8* 26.3* 26.8*  MCV 83.1  < > 87.1 86.4 87.4 87.7 88.7  PLT 59*  < > 76* 98* 80* 82* 94*  < > = values in this interval not displayed. Cardiac Enzymes: No results for input(s): CKTOTAL, CKMB, CKMBINDEX, TROPONINI in the last 168 hours. BNP: BNP (last 3 results)  Recent Labs  06/26/14 0357 07/02/14 0410 09/16/2014 2159  BNP 626.8* 703.3* 1350.8*    ProBNP (last 3 results)  Recent Labs  01/04/14 0450 04/21/14 1504  PROBNP 3786.0* 13092.0*    CBG:  Recent Labs Lab 10/02/14 1957 10/02/14 2317 10-11-2014 0348 11-Oct-2014 0742 10-11-2014 1218  GLUCAP 100* 106* 87 76 79       Signed:  Shanece Cochrane  [facility] 2014-10-11, 4:11 PM

## 2014-10-11 NOTE — Progress Notes (Signed)
[facility] PROGRESS NOTE  TORON BOWRING ZDG:[id number] DOB: 02-05-1941 DOA: 17-Oct-2014 PCP: Lillia Mountain, MD   Brief narrative: 74 year old male with history of paroxysmal A. fib on Coumadin, goal C COPD, CAD, hypertension, systolic and diastolic CHF, chronic kidney disease stage III and history of PE was recently hospitalized for hip fracture that was managed conservatively. During the admission he was found to have PE and discharged on Coumadin.   Hospital course 5/9:  patient was admitted to the hospitalist service with colitis, sepsis due to healthcare associated pneumonia. 5/12: Patient became bradycardic, hypoxic and obtunded, intubated and transferred to ICU. 5/13: Had rapid deterioration with persistent bradycardia and found to be in septic shock requiring pressors. Worsened renal function. 5/14: Maintain on pressors. Echo done showed severe MR. 5/15: Drop in platelets (68) , weaned pressors but failed extubation. Started on tube feeds. 5/16:off pressors but failed spontaneous breathing trial. Started on Lasix drip and narrowed antibiotics. 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 Recommended patient will not tolerate surgery at this time and TEE may not contribute much. Added digoxin. 5/17: Patient more awake. Added Precedex. Switch ceftriaxone to Levaquin as patient . lasix dose increased . 5/18: Renal function worsened so Lasix drip was stopped. Right-sided thoracentesis done for pleural effusion with 1000 mL transited to fluid removed. Follow-up chest x-ray showed a small medial pneumothorax (ex vacuo). Patient was successfully extubated. 5/19: Stable ICU. Failed swallow evaluation. 5/20: Effusion returned. Lasix was added back. Placed panda tube. Pt  hypernatremic and started on free water. Antibiotic discontinued. 5/21: Patient transferred hospitalist service on telemetry. Patient  pulled out panda tube which was replaced. 5/23: Patient becoming more confused, pulled out panda again. Moaning with discomfort and given some morphine. Discussed with wife and son over the phone about goals of care and involving palliative care discussions. Both agree.        Assessment/Plan:  Septic versus cardiogenic shock Now resolved. Off pressors.   Acute respiratory failure with hypoxemia Secondary to CHF and pneumonia. Receiving Lasix when necessary. Widening facemask today.   Acute on chronic combined systolic and diastolic CHF -2-D echo from 5/13 with EF of 45% with severe MR. 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 Cardiology following. -IV Lasix PRN. Not a candidate for valve replacement surgery at this time.  -Monitor strict I/O. Still has high positive balance. -Continue beta blocker and statin.  Acute on chronic kidney injury Improving daily.. Continue to monitor.  Ongoing dysphagia Has failed swallow evaluation since extubation. Placed panda tube and started to feeding patient pulled it out last night. On attempted to reinsert patient became bradycardia cardiac to 40s. Will try again today and resume feeds. Goals of care discussions pending  Hypernatremia Continue free water via NG tube  A. fib with RVR Currently rate controlled. Initially loaded with digoxin. Now on low-dose Coreg and amiodarone. Cardiology plan   on TEE/cardioversion prior to discharge. On IV heparin.  Holding Coumadin due to thrombocytopenia.  Anemia Drop in H&H noted. Monitor closely while on heparin drip. Holding off transfusion.  HCAP Completed abx course.    Thrombocytopenia  Possibly related to sepsis. HIT panel negative. Monitor daily.  Metabolic encephalopathy Noted to be more confused early this morning but seemed to improve during the day. Monitor closely.  Right ex-vacuo pneumothorax.  Stable. Continue to monitor.  History of PE On Coumadin which is on hold due  to thrombocytopenia. Continue to monitor while  on heparin gtt.   hypokalemia Replenished   Diet: Nothing by mouth and awaiting reinsertion of panda tube to resume feeds.  DVT prophylaxis: On IV heparin.  Code Status: DO NOT RESUSCITATE  Family Communication: Wife and patient's son on the phone. Nephew at bedside  Disposition Plan:  Patient progressing very poorly after 2 weeks of hospitalization. He was hospitalized back in February with severe sepsis and goals of care discussion with palliative care was done with plan on hospice but patient clinically improved and was discharged to skilled nursing facility with recommendations for outpatient palliative care. Given progressive functional decline discussed in detail with patient's wife and his son on the phone separately. Both of them agree on involving palliative care again for goals of care discussion. Son who lives in [place] emphasizes on  Keeping patient comfortable and agrees on residential hospice if patient condition is not improving. He will plan to be here as early as tomorrow.   Consultants:  PCCM  cardiology  Procedures:  Intubation   2D ECHO  Antibiotics:  Completed on 5/20  HPI/Subjective: Patient lethargic and restless. Currently on facemask. Pulled back and a tube out overnight and unable to place it back as patient was bradycardic. Received a dose of IV morphine and felt more comfortable. Also ordered for 40 mg IV Lasix x2 doses Objective: Filed Vitals:   09/17/2014 1352  BP: 121/54  Pulse: 72  Temp: 97.4 F (36.3 C)  Resp: 24    Intake/Output Summary (Last 24 hours) at 09/17/2014 1407 Last data filed at 09/16/2014 1352  Gross per 24 hour  Intake 963.08 ml  Output   2280 ml  Net -1316.92 ml   Filed Weights   10/01/14 0515 10/02/14 0415 09/19/2014 0355  Weight: 87.635 kg (193 lb 3.2 oz) 87.5 kg (192 lb 14.4 oz) 84.641 kg (186 lb 9.6 oz)    Exam:   General:  Elderly frail male in NAD, lethargic  and fatigue  HEENT: , dry oral mucosa, JVD+, lt IJ  Cardiovascular: S1&S2 regular, systolic murmur 3/6, no rubs or gallop  Respiratory: Diminished bibasilar breath sounds, scattered rhonchi  GI: soft, NT, ND, BS+  musculoskeletal: 1+ edema bilaterally, scrotal edema, foley in place   CNS: Sleepy but arousable, not oriented.     Data Reviewed: Basic Metabolic Panel:  Recent Labs Lab 09/26/14 2211 09/27/14 0410  09/29/14 0431 09/30/14 0133 10/01/14 0544 10/02/14 0437 09/12/2014 0509  NA  --   --   < > 154* 153* 152* 153* 152*  K  --   --   < > 3.5 3.2* 3.8 4.0 3.9  CL  --   --   < > 117* 116* 116* 116* 114*  CO2  --   --   < > GLUCOSE  --   --   < > 101* 86 116* 111* 98  BUN  --   --   < > 77* 67* 69* 71* 75*  CREATININE  --   --   < > 2.15* 2.16* 1.95* 1.75* 1.66*  CALCIUM  --   --   < > 8.7* 8.9 8.5* 8.3* 8.5*  MG 2.0 2.0  --   --   --   --   --  2.1  PHOS  --  3.0  --  4.1  --   --   --   --   < > = values in this interval not displayed. Liver Function Tests:  Recent Labs Lab 09/27/14 1645 09/30/14 0133  AST  --  31  ALT  --  66*  ALKPHOS  --  85  BILITOT  --  1.8*  PROT 6.3* 6.3*  ALBUMIN  --  2.2*   No results for input(s): LIPASE, AMYLASE in the last 168 hours. No results for input(s): AMMONIA in the last 168 hours. CBC:  Recent Labs Lab 09/27/14 0410  09/29/14 0431 09/30/14 0133 10/01/14 0544 10/02/14 0437 10/09/14 0509  WBC 4.4  < > 4.3 5.1 6.3 6.8 8.2  NEUTROABS 2.9  --   --   --   --   --   --   HGB 9.0*  < > 8.9* 9.2* 8.4* 7.5* 7.5*  HCT 30.9*  < > 31.0* 32.5* 29.8* 26.3* 26.8*  MCV 83.1  < > 87.1 86.4 87.4 87.7 88.7  PLT 59*  < > 76* 98* 80* 82* 94*  < > = values in this interval not displayed. Cardiac Enzymes: No results for input(s): CKTOTAL, CKMB, CKMBINDEX, TROPONINI in the last 168 hours. BNP (last 3 results)  Recent Labs  06/26/14 0357 07/02/14 0410 09/16/2014 2159  BNP 626.8* 703.3* 1350.8*    ProBNP (last  3 results)  Recent Labs  01/04/14 0450 04/21/14 1504  PROBNP 3786.0* 13092.0*    CBG:  Recent Labs Lab 10/02/14 1957 10/02/14 2317 10-09-14 0348 2014/10/09 0742 09-Oct-2014 1218  GLUCAP 100* 106* 87 76 79    Recent Results (from the past 240 hour(s))  Body fluid culture     Status: None   Collection Time: 09/27/14  3:50 PM  Result Value Ref Range Status   Specimen Description PLEURAL  Final   Special Requests NONE  Final   Gram Stain   Final    RARE WBC PRESENT, PREDOMINANTLY MONONUCLEAR NO ORGANISMS SEEN Performed at [facility]    Culture   Final    NO GROWTH 3 DAYS Performed at [facility]    Report Status 10/01/2014 FINAL  Final     Studies: Dg Chest Port 1 View  10/09/2014   CLINICAL DATA:  Aspiration.  Congestion.  EXAM: PORTABLE CHEST - 1 VIEW  COMPARISON:  09/30/2014  FINDINGS: Left IJ central line, tip at the SVC level. The feeding tube has been removed.  Moderate bilateral pleural effusions which appear layering. There is a increased opacity in the left mid chest which favors fissural fluid due to position and angular medial/ inferior margins. No definite edema or new airspace disease.  Chronic cardiomegaly. The patient is status post CABG. Negative aortic and hilar contours.  IMPRESSION: 1. Moderate bilateral pleural effusion, increased from 09/30/2014. 2. New left chest opacity favors fissural fluid over airspace disease.   Electronically Signed   By: Marnee Spring M.D.   On: 09-Oct-2014 03:20    Scheduled Meds: . antiseptic oral rinse  7 mL Mouth Rinse QID  . atorvastatin  10 mg Oral q1800  . budesonide (PULMICORT) nebulizer solution  0.5 mg Nebulization BID  . chlorhexidine  15 mL Mouth Rinse BID  . free water  300 mL Per Tube 6 times per day  . furosemide  40 mg Intravenous BID  . Gerhardt's butt cream   Topical TID  . ipratropium-albuterol  3 mL Nebulization TID  . lipase/protease/amylase  2 capsule Oral Once   And  . sodium  bicarbonate  650 mg Oral Once  . magnesium oxide  400 mg Per Tube Daily  . metoprolol  2.5 mg  Intravenous 4 times per day  . senna  1 tablet Oral Daily  . sodium chloride  3 mL Intravenous Q12H   Continuous Infusions: . amiodarone 30 mg/hr (10/10/2014 1144)  . feeding supplement (VITAL AF 1.2 CAL) 1,000 mL (10/02/14 2233)  . heparin 1,950 Units/hr (09/14/2014 0219)      Time spent: 25 minutes    Tarra Pence  [facility] Pager [phone]. If 7PM-7AM, please contact night-coverage at [email or web address], password [email or web address] 09/11/2014, 2:07 PM  LOS: 14 days

## 2014-10-11 NOTE — Progress Notes (Signed)
CSW following for discharge planning. Note Palliative Care Consult was ordered this morning. CSW spoke with Florentina Jenny at [facility] who informed CSW that patient's wife told SNF that she planned to take patient home with hospice. CSW will follow-up once palliative has met with patient/wife/son.    Raynaldo Opitz, [facility] Clinical Social Worker cell #: [phone]

## 2014-10-11 NOTE — Progress Notes (Signed)
RT NT suctioned patient with success. Patient sounds much better and seems to be more comfortable. RT will continue to monitor.

## 2014-10-11 NOTE — Progress Notes (Signed)
PT home respiratory meds (Albuterol TID, Spiriva QD, Symbicort BID) Currently ordered in hospital this admission (Duo TID, Pulmicort BID)  Albuterol and Spiriva- Duo neb at this time Symbicort- Pulmicort at this time (steroid)

## 2014-10-11 NOTE — Care Management Note (Signed)
Case Management Note  Patient Details  Name: Nathan KellMaurice W Fruin MRN: [id number] Date of Birth: 02/25/1941  Subjective/Objective: Palliative cons. Await recommendations.                   Action/Plan:Family leaning toward residential hospice if recommended.   Expected Discharge Date:   (UNKNOWN)               Expected Discharge Plan:  Skilled Nursing Facility  In-House Referral:     Discharge planning Services  CM Consult  Post Acute Care Choice:    Choice offered to:     DME Arranged:    DME Agency:     HH Arranged:    HH Agency:     Status of Service:  In process, will continue to follow  Medicare Important Message Given:  Yes Date Medicare IM Given:  2015/04/04 Medicare IM give by:  Lanier ClamMahabir, Reid Nawrot Date Additional Medicare IM Given:    Additional Medicare Important Message give by:     If discussed at Long Length of Stay Meetings, dates discussed:    Additional Comments:  Lanier ClamMahabir, Waleed Dettman, RN Dec 04, 2014, 3:13 PM

## 2014-10-11 NOTE — Progress Notes (Signed)
Physical Therapy Treatment Patient Details Name: Nathan KellMaurice W [name] MRN: [id number] DOB: 12/15/1940 Today's Date: 08-Sep-2014    History of Present Illness 74 year old male w/ PMH: PAF on coumadin, GOLD C COPD, CAD, HTN, systolic/diastolic CHF, CKD III, and PE. He was recently admitted for hip fracture (Feb 2016, DC to SNF then home) , which was to be managed conservatively. Pt admitted with HCAP, colitis, sepsis.     PT Comments    [facility] slow [facility] will follow for goals of care and any further needs;  Follow Up Recommendations  SNF     Equipment Recommendations  None recommended by PT    Recommendations for Other Services       Precautions / Restrictions Precautions Precautions: Fall Precaution Comments: NPO Restrictions Weight Bearing Restrictions: No    Mobility  Bed Mobility Overal bed mobility: Needs Assistance Bed Mobility: Rolling;Sidelying to Sit Rolling: Max assist Sidelying to sit: Mod assist;HOB elevated       General bed mobility comments: assist to initiate movement, assist to bring trunk to upright  Transfers Overall transfer level: Needs assistance Equipment used: Rolling walker (2 wheeled) Transfers: Sit to/from Stand;Lateral/Scoot Transfers Sit to Stand: +2 physical assistance;Total assist        Lateral/Scoot Transfers: +2 physical assistance;Max assist;Total assist General transfer comment: pt with difficulty extending knees/WBing;  unable to come to full stand with +2 assist  Ambulation/Gait             General Gait Details: unable   Stairs            Wheelchair Mobility    Modified Rankin (Stroke Patients Only)       Balance Overall balance assessment: Needs assistance Sitting-balance support: Bilateral upper extremity supported;Feet supported Sitting balance-Leahy Scale: Fair       Standing balance-Leahy Scale: Zero                      Cognition Arousal/Alertness:  Awake/alert Behavior During Therapy: WFL for tasks assessed/performed Overall Cognitive Status: Within Functional Limits for tasks assessed                      Exercises General Exercises - Lower Extremity Ankle Circles/Pumps: AROM;10 reps;Both Heel Slides: AROM;Both;10 reps    General Comments        Pertinent Vitals/Pain Pain Assessment: No/denies pain    Home Living                      Prior Function            PT Goals (current goals can now be found in the care plan section) Acute Rehab PT Goals Patient Stated Goal: to get stronger Time For Goal Achievement: 10/12/14 Potential to Achieve Goals: Fair Progress towards PT goals: Not progressing toward goals - comment [facility](PCC pending)    Frequency  Min 3X/week    PT Plan Current plan remains appropriate    Co-evaluation             End of Session Equipment Utilized During Treatment: Gait belt;Oxygen Activity Tolerance: Patient limited by fatigue Patient left: in chair;with call bell/phone within reach     Time: 1026-1052 PT Time Calculation (min) (ACUTE ONLY): 26 min  Charges:  $Therapeutic Activity: 23-37 mins                    G Codes:      Raylinn Kosar 08-Sep-2014, 12:55  PM   

## 2014-10-11 NOTE — Progress Notes (Addendum)
   09/23/2014 1600  Clinical Encounter Type  Visited With Family  Visit Type Death;Spiritual support;Psychological support  Referral From Nurse  Consult/Referral To Nurse  Spiritual Encounters  Spiritual Needs Grief support;Emotional  Stress Factors  Family Stress Factors Loss    Chaplain providing grief support at bedside with pt's spouse.  Provided narrative review, normalization of grief responses, grief education and emotional support / empathic presence.

## 2014-10-11 NOTE — Progress Notes (Signed)
Pt without heartbeat and respirations. Auscultated by 2 RNs. No heart sounds and breath sounds. MD notified. Family @ bedside. Doristine ChurchBert (nephew) @ bedside informing rest of family.  Delford FieldGagliano, Bradd Merlos E, RN Marissa Long RN

## 2014-10-11 NOTE — Progress Notes (Signed)
ANTICOAGULATION CONSULT NOTE - follow up  Pharmacy Consult for Heparin/warfarin Indication: atrial fibrillation  No Known Allergies  Patient Measurements: Height: 6\' 1"  (185.4 cm) Weight: 186 lb 9.6 oz (84.641 kg) IBW/kg (Calculated) : 79.9 Heparin Dosing Weight: 91.5  Vital Signs: Temp: 97.8 F (36.6 C) (05/24 0355) Temp Source: Axillary (05/24 0355) BP: 148/68 mmHg (05/24 0645) Pulse Rate: 81 (05/24 0355)  Labs:  Recent Labs  10/01/14 0544 10/01/14 1423 10/02/14 0437 2014/06/18 0509  HGB 8.4*  --  7.5* 7.5*  HCT 29.8*  --  26.3* 26.8*  PLT 80*  --  82* 94*  LABPROT 17.7*  --  19.3* 18.6*  INR 1.45  --  1.62* 1.54*  HEPARINUNFRC 0.56 0.52 0.59 0.65  CREATININE 1.95*  --  1.75* 1.66*    Estimated Creatinine Clearance: 43.5 mL/min (by C-G formula based on Cr of 1.66).   Medical History: Past Medical History  Diagnosis Date  . Aorto-iliac disease     a. s/p bilat with stent 2007 - followed by [facility].  . CAD (coronary artery disease)     a. 10/2011 s/p CABG x4 (LIMA-LAD, SVG-Diag, SVG-OM1 and SVG-PDA)  b. 03/2012 NSTEMI s/p LHC SVG-RCA and LIMA-LAD but total occlusion of SVG-OM as well as SVG-diag. RX therapy recommended   . Hypertension   . ED (erectile dysfunction)   . COPD, severe   . Hyperlipidemia   . Stroke   . GERD (gastroesophageal reflux disease)   . Gout   . Paroxysmal atrial flutter     ablation  . Chronic combined systolic and diastolic CHF (congestive heart failure)     a. Last echo 04/2014: EF 40-45%.  Marland Kitchen. PAF (paroxysmal atrial fibrillation)   . CKD (chronic kidney disease), stage III   . Tobacco abuse   . History of noncompliance with medical treatment   . Carotid artery disease     a. RCEA 2013 - followed by [facility].  . Anemia   . Lower GI bleed     a. LGIB 10/2012: Colonoscopy performed June 21 showed blood in the colon but no definite source identified, diverticular versus AVM suspected. Aspirin stopped.  . Mitral regurgitation   . Hypertensive  heart disease   . PE (pulmonary embolism)     Medications:  Scheduled:  . amiodarone  200 mg Oral BID  . antiseptic oral rinse  7 mL Mouth Rinse QID  . atorvastatin  10 mg Oral q1800  . budesonide (PULMICORT) nebulizer solution  0.5 mg Nebulization BID  . carvedilol  3.125 mg Per Tube BID WC  . chlorhexidine  15 mL Mouth Rinse BID  . free water  300 mL Per Tube 6 times per day  . Gerhardt's butt cream   Topical TID  . ipratropium-albuterol  3 mL Nebulization TID  . lipase/protease/amylase  2 capsule Oral Once   And  . sodium bicarbonate  650 mg Oral Once  . magnesium oxide  400 mg Per Tube Daily  . senna  1 tablet Oral Daily  . sodium chloride  3 mL Intravenous Q12H   Infusions:  . feeding supplement (VITAL AF 1.2 CAL) 1,000 mL (10/02/14 2233)  . heparin 1,950 Units/hr (2014/06/18 0219)    Assessment: [age] yoM w/ PMH AFib, recent PE, COPD, CKD3, HFpEF admitted 5/9 with colitis and pneumosepsis from HCAP.  Complicated hospital course requiring mechanical ventilation and pressor support.  Noted thrombocytopenia starting 5/15 and HIT panel ordered, which resulted negative.  Remains in Afib with rate controlled by BBs  and digoxin.  Cardiology started amiodarone today without loading dose, stopped digoxin.  To resume anticoagulation with heparin per CCM.  Continuing to hold coumadin for now per cardiology d/t thrombocytopenia, although INR now normalized.  To eventually undergo TEE/cardioversion and resume coumadin   INR supratherapeutic on admission, received multiple doses Vit K during admission; currently subtherapeutic  Prior anticoagulation: warfarin 4 mg daily with recent PE following hip surgery  Significant events: 5/18 HIT panel negative; extubated  Today, 10/09/2014:  Daily heparin level therapeutic on 1950 units/hr  CBC: Plt dropped into 50s during admission, now recovering.  Timing & nadir consistent w/ HIT but Ab panel negative.  Could be sepsis-related.  Hgb low but  stable today; MD aware, following closely  No bleeding/issues per RN  INR subtherapeutic although mildly elevated (> 1.5)  SCr high but continues to improve; CrCl 44 ml/min  Goal of Therapy: Heparin level 0.3-0.7 units/ml Monitor platelets by anticoagulation protocol: Yes  Plan:  Continue Heparin at 1950 units/hr IV infusion  No warfarin tonight  Daily INR, CBC, heparin level  Monitor for signs of bleeding or thrombosis  Follow-up plans for TEE/cardioversion and resuming warfarin  Bernadene Person, PharmD Pager: [phone] 09/11/2014, 7:46 AM

## 2014-10-11 NOTE — Progress Notes (Addendum)
Pt pulled out [name] tube. While reinserting [name] tube, pt HR decreased to 38. [name] tube removed and HR increased to 46. Pt more congested and with an increase WOB. Respiration 40. Donnamarie PoagK. Kirby, NP notified. Instructed to hold all medication that are via [name]. New orders placed in Epic. Will continue to monitor.

## 2014-10-11 NOTE — Progress Notes (Signed)
Patient Name: Nathan KellMaurice W Pedraza Date of Encounter: January 18, 2015  Principal Problem:   Sepsis Active Problems:   HCAP (healthcare-associated pneumonia)   Colitis   Constipation   AKI (acute kidney injury)   CKD (chronic kidney disease)   Acute systolic congestive heart failure   HLD (hyperlipidemia)   GERD without esophagitis   Shock   Malnutrition of moderate degree   Acute respiratory failure with hypoxia   Shock circulatory   ATN (acute tubular necrosis)   Mitral regurgitation   Abdominal pain   Paroxysmal atrial fibrillation   Pleural effusion, right   Dysphagia, pharyngoesophageal phase   Acute kidney injury   Primary Cardiologist: Dr Katrinka BlazingSmith  Patient Profile: 74 y.o. male with hx chronic S/D CHF, CABG x4 (2013), COPD, CKD, HTN, PVD s/p bilat aorto-iliac stenting (2007), RCEA (2013), CVA, PAF s/p ablation on coumadin, LGIB (2014), PE & hip fx 06/2014, and mod MR admitted 09/28/2014 w/ constipation, SOB, sepsis 2/2 HCAP and colitis, severe coagulopathy with INR >10, ARF and hypotension. Intubated 05/12>05/18, effusions, s/p tap 05/18, had small PTX. Echo w/ severe MR and cards consulted for eval, TEE not planned. Pt has not improved, family considering Palliative care consult.  SUBJECTIVE: Pt responds to verbal, not able to say much. Confused. A great deal of upper airway congestion, poor cough.  OBJECTIVE Filed Vitals:   11-20-14 0355 11-20-14 0645 11-20-14 0724 11-20-14 0744  BP: 133/60 148/68    Pulse: 81     Temp: 97.8 F (36.6 C)     TempSrc: Axillary     Resp: 24     Height:      Weight: 186 lb 9.6 oz (84.641 kg)     SpO2: 98%  85% 90%    Intake/Output Summary (Last 24 hours) at 11-20-14 0753 Last data filed at 11-20-14 [id number]  Gross per 24 hour  Intake 966.08 ml  Output   1980 ml  Net -1013.92 ml   Filed Weights   10/01/14 0515 10/02/14 0415 11-20-14 0355  Weight: 193 lb 3.2 oz (87.635 kg) 192 lb 14.4 oz (87.5 kg) 186 lb 9.6 oz (84.641 kg)    PHYSICAL  EXAM General: Well developed, well nourished, male in no acute distress. Head: Normocephalic, atraumatic.  Neck: Supple without bruits, JVD 9 cm. Lungs:  Resp regular and unlabored, coarse rales and rhonchi. Heart: Irreg irreg, S1, S2, no S3, S4, 2/6 murmur; no rub. Abdomen: Soft, non-tender, non-distended, BS + x 4.  Extremities: No clubbing, cyanosis, no edema.  Neuro: Alert and oriented X 2. Moves all extremities spontaneously.  LABS: CBC: Recent Labs  10/02/14 0437 11-20-14 0509  WBC 6.8 8.2  HGB 7.5* 7.5*  HCT 26.3* 26.8*  MCV 87.7 88.7  PLT 82* 94*   INR: Recent Labs  11-20-14 0509  INR 1.54*   Basic Metabolic Panel: Recent Labs  10/02/14 0437 11-20-14 0509  NA 153* 152*  K 4.0 3.9  CL 116* 114*  CO2 28 28  GLUCOSE 111* 98  BUN 71* 75*  CREATININE 1.75* 1.66*  CALCIUM 8.3* 8.5*   BNP:  B NATRIURETIC PEPTIDE  Date/Time Value Ref Range Status  10/04/2014 09:59 PM 1350.8* 0.0 - 100.0 pg/mL Final  07/02/2014 04:10 AM 703.3* 0.0 - 100.0 pg/mL Final    TELE:        Radiology/Studies: Dg Chest Port 1 View  January 18, 2015   CLINICAL DATA:  Aspiration.  Congestion.  EXAM: PORTABLE CHEST - 1 VIEW  COMPARISON:  09/30/2014  FINDINGS:  Left IJ central line, tip at the SVC level. The feeding tube has been removed.  Moderate bilateral pleural effusions which appear layering. There is a increased opacity in the left mid chest which favors fissural fluid due to position and angular medial/ inferior margins. No definite edema or new airspace disease.  Chronic cardiomegaly. The patient is status post CABG. Negative aortic and hilar contours.  IMPRESSION: 1. Moderate bilateral pleural effusion, increased from 09/30/2014. 2. New left chest opacity favors fissural fluid over airspace disease.   Electronically Signed   By: Marnee Spring M.D.   On: 2014/10/08 03:20    Current Medications:  . amiodarone  200 mg Oral BID  . antiseptic oral rinse  7 mL Mouth Rinse QID  .  atorvastatin  10 mg Oral q1800  . budesonide (PULMICORT) nebulizer solution  0.5 mg Nebulization BID  . carvedilol  3.125 mg Per Tube BID WC  . chlorhexidine  15 mL Mouth Rinse BID  . free water  300 mL Per Tube 6 times per day  . Gerhardt's butt cream   Topical TID  . ipratropium-albuterol  3 mL Nebulization TID  . lipase/protease/amylase  2 capsule Oral Once   And  . sodium bicarbonate  650 mg Oral Once  . magnesium oxide  400 mg Per Tube Daily  . senna  1 tablet Oral Daily  . sodium chloride  3 mL Intravenous Q12H   . feeding supplement (VITAL AF 1.2 CAL) 1,000 mL (10/02/14 2233)  . heparin 1,950 Units/hr (08-Oct-2014 0219)    ASSESSMENT AND PLAN: Severe MR- 2D ECHO 06/2014 with EF 40% with diffuse HK and mod MR, RV pressure/volume overload, mod TR and mild pulm HTN. Limited 2D ECHO this admission with stable LVEF but now severe MR and suggesting TEE to further investigate.  -- Dr Katrinka Blazing, his primary cardiologist believes he has significant MR based upon exam and prior echo studies. MR is likely related to papillary muscle dysfunction in this patient with h/o prior inferior MI.  -- AF with RVR is not helping the overall situation. BB therapy held due to hypotension, restarted 05/19. TEE will not add much and may be harmful given co-morbidities and tenuous hemodynamics. He would not be a candidate for repeat open heart surgery.  AF RVR- Was on dig, d/c'd 05/20  -- Currently rates are much better controlled.  -- Started on Coreg 3.125 mg bid 05/19, no doses held x 72 hr -- will change amio, and Coreg to IV since no PO meds for now.  NSVT-  PVCs overnight. Previously had multiple short runs on tele. Continue BB.   Acute hypoxemic respiratory failure - per CCM, extubated 05/18  Shock - septic vs cardiogenic-->shock resolved. Now off pressors.   Acute on chronic systolic/diastolic CHF - echo 4/54/09: ef 45% - Lasix gtt d/c'd 05/18.  - Possible medial rt ptx, suspect that this is  ex vacuo following R thoracentesis on 5/18 (transudate) - Pt appears overloaded, got Lasix 40 mg IV x 1 05/22, got Lasix 40 mg IV BID x 2 doses 05/23; continue IV Lasix today  Acute on CKD--> creat improved: peak 3.97 (05/12) 3.37--> 2.9 --> 2.15 (05/20)-->1.66 (05/24)  Coumadin coagulopathy - Coumadin on hold. INR 1.54 today. Pharmacy following, now back on heparin - has been sub-therapeutic since 05/14, if restoration of SR planned, will need TEE.  Thrombocytopenia:  -- 5/19 plts 75 -- Plt up to 94 today  Acute metabolic encephalopathy/Dementia-  -- worsening recently per son.  --  Mental status has improved some with improvement in his medical condition.  -- He appears below his baseline. -- Poor swallowing, pulled out [name] tube 05/24  DNR - Full DNR now - Primary MD to discuss Palliative Care with family.   Otherwise, per IM Principal Problem:   Sepsis Active Problems:   HCAP (healthcare-associated pneumonia)   Colitis   Constipation   AKI (acute kidney injury)   CKD (chronic kidney disease)   Acute systolic congestive heart failure   HLD (hyperlipidemia)   GERD without esophagitis   Shock   Malnutrition of moderate degree   Acute respiratory failure with hypoxia   Shock circulatory   ATN (acute tubular necrosis)   Mitral regurgitation   Abdominal pain   Paroxysmal atrial fibrillation   Pleural effusion, right   Dysphagia, pharyngoesophageal phase   Acute kidney injury   Signed, Barrett, Rhonda , PA-C 7:53 AM 09/23/2014   I have personally seen and examined this patient with Theodore Demark, PA-C. I agree with the assessment and plan as outlined above. He is pleasantly confused. He remains in atrial fib but rate controlled. Would not plan cardioversion at this time. TEE would be needed prior to cardioversion as INR has been sub-therapeutic. Severe MR on echo but not operative candidate. IV Lasix today for volume overload. Primary team to discuss  palliation. Agree that this is appropriate.   MCALHANY,CHRISTOPHER 09/29/2014 10:47 AM

## 2014-10-11 NOTE — Progress Notes (Signed)
SLP Cancellation Note  Patient Details Name: Nathan [name] MRN: [id number] DOB: 12/22/1940   Cancelled treatment:       Reason Eval/Treat Not Completed: Other (comment) (Reviewed notes that show pt pulled out panda.  Also per MD note, pt was moaning earlier required morphine and palliative referral pending with family possibly leaning toward comfort.  SLP to follow up next date re: care plan, ? comfort measures only. +)   [name], MS [facility] [phone]

## 2014-10-11 NOTE — Progress Notes (Signed)
Patient's body transported to the morgue. Bed placement called and updated on status.

## 2014-10-11 NOTE — Progress Notes (Signed)
Results of chest xray text paged to Donnamarie PoagK Kirby, NP. Will continue to monitor.

## 2014-10-11 NOTE — Progress Notes (Addendum)
Mrs. Nathan [name] was bedside and tearfully making calls when I arrived. She was expecting a friend and after waiting for more than an hour she decided to say goodbye to her husband. During our visit she told me about her husband and their relationship/marriage and at other moments she was still very tearful. I encouraged her to share her stories as well as cry as she needed. Several times she asked what to do now and I assured her what she is doing is all she has to do right now. She was very concerned about hers/his employees and how they will grieve.  She thanked me for being there and holding her hand. I allowed Mrs. Nathan [name] to say goodbye alone.  Provided support as long as needed along with assuring, when she apologize for crying, of the normalcy of her grieving. She grieved appropriately and with dignity.  Nathan [name] Chaplain   09/22/2014 1800  Clinical Encounter Type  Visited With Family    After last writing, I returned to pt room to find his wife trying to leave. She wept and said she didn't want to leave him. She wanted to follow him to the morgue and I advised her otherwise. I stayed with her as she cried and regained composure to leave. She was very thankful for Chaplain support. She has the number for bed services to call when pt's son arrives from [place] and they make a decision about a funeral home selection. I walked Mrs. Nathan [name] to elevator and advised nurse of same. Nathan [name] Chaplain

## 2014-10-11 DEATH — deceased

## 2015-05-12 IMAGING — CR DG CHEST 2V
2 series · 2 of 2 positions shown · non-contrast
Comparison: Chest radiograph performed 07/07/2014

CLINICAL DATA: Acute onset of shortness of breath. Subsequent
encounter.

EXAM:
CHEST  2 VIEW

[w chest lat]
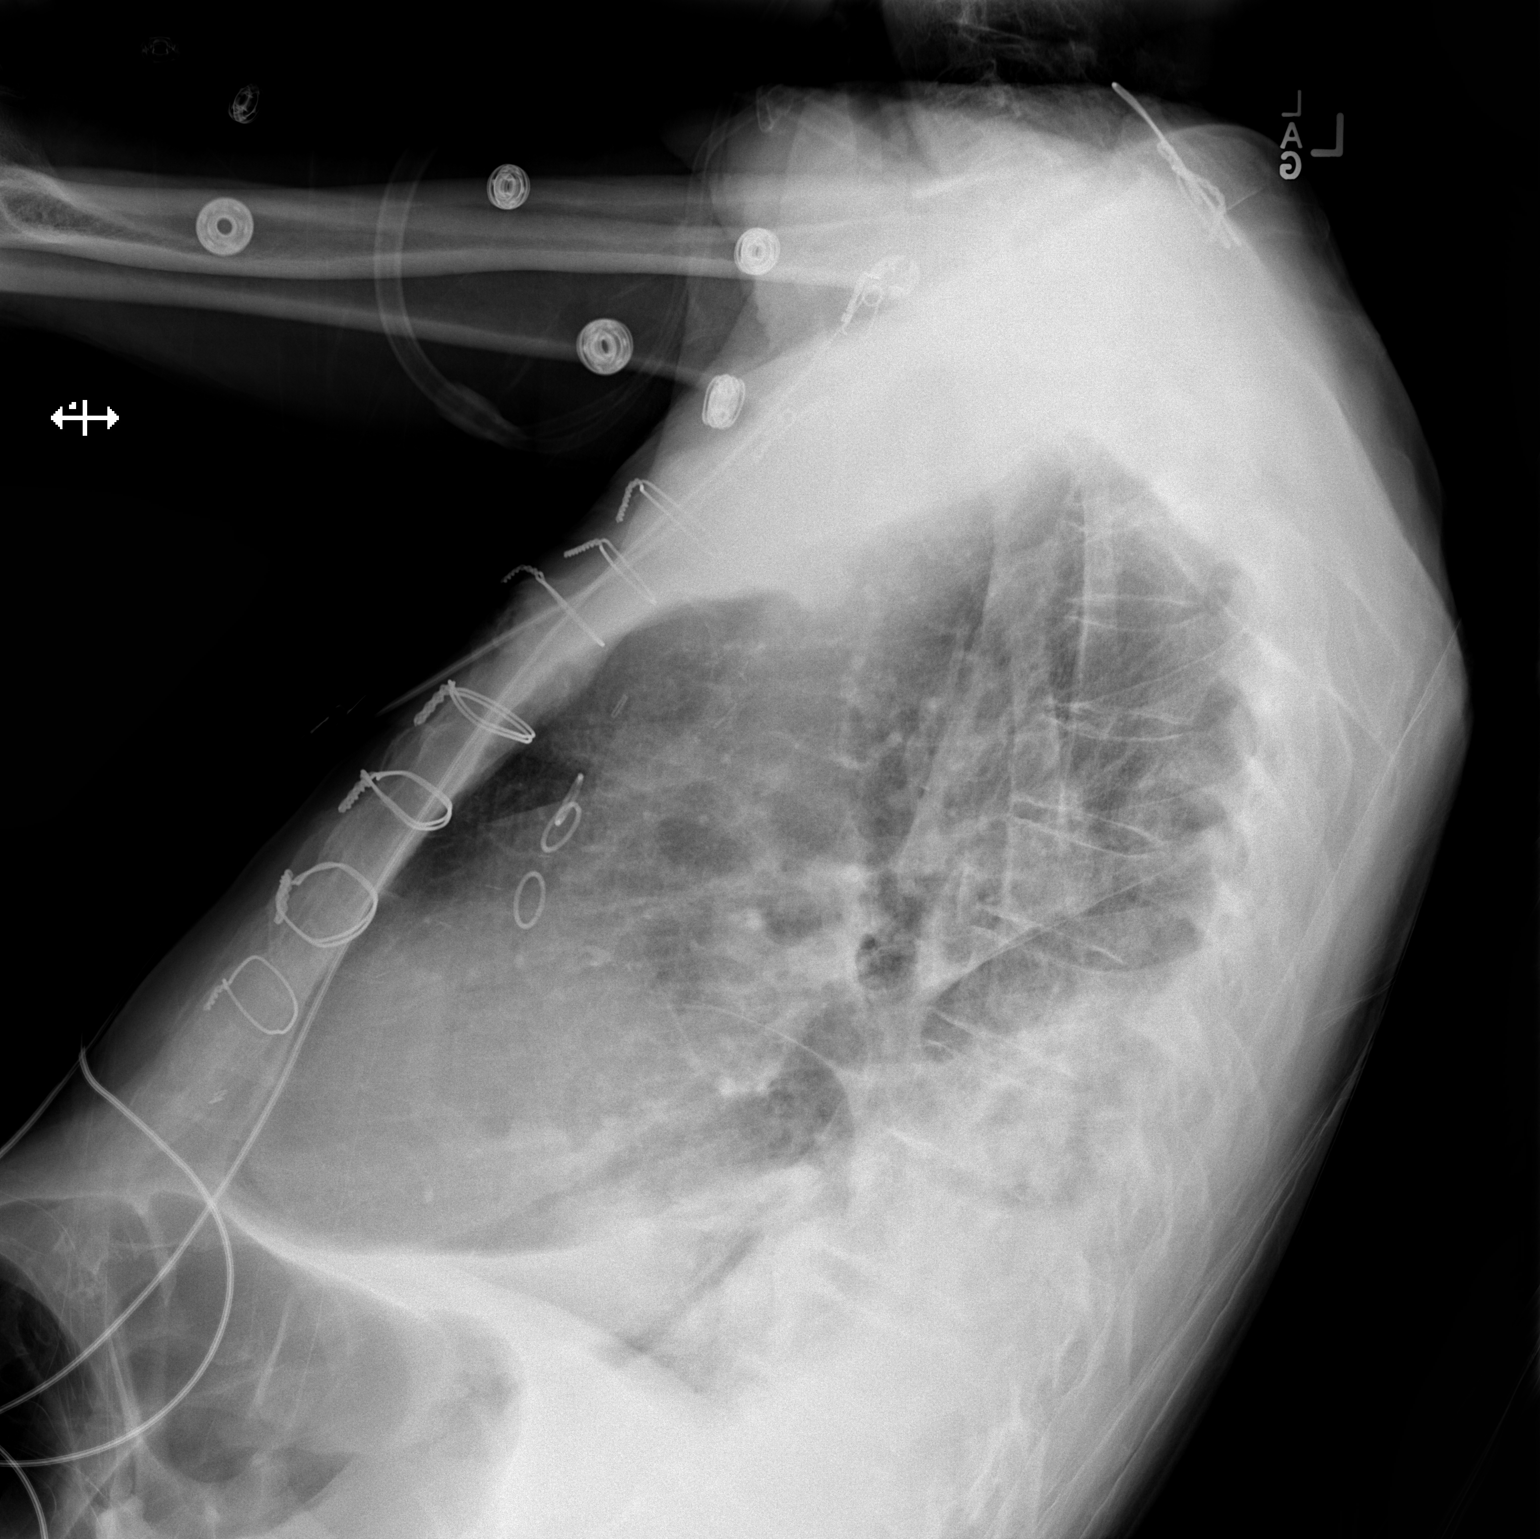

[x chest ap]
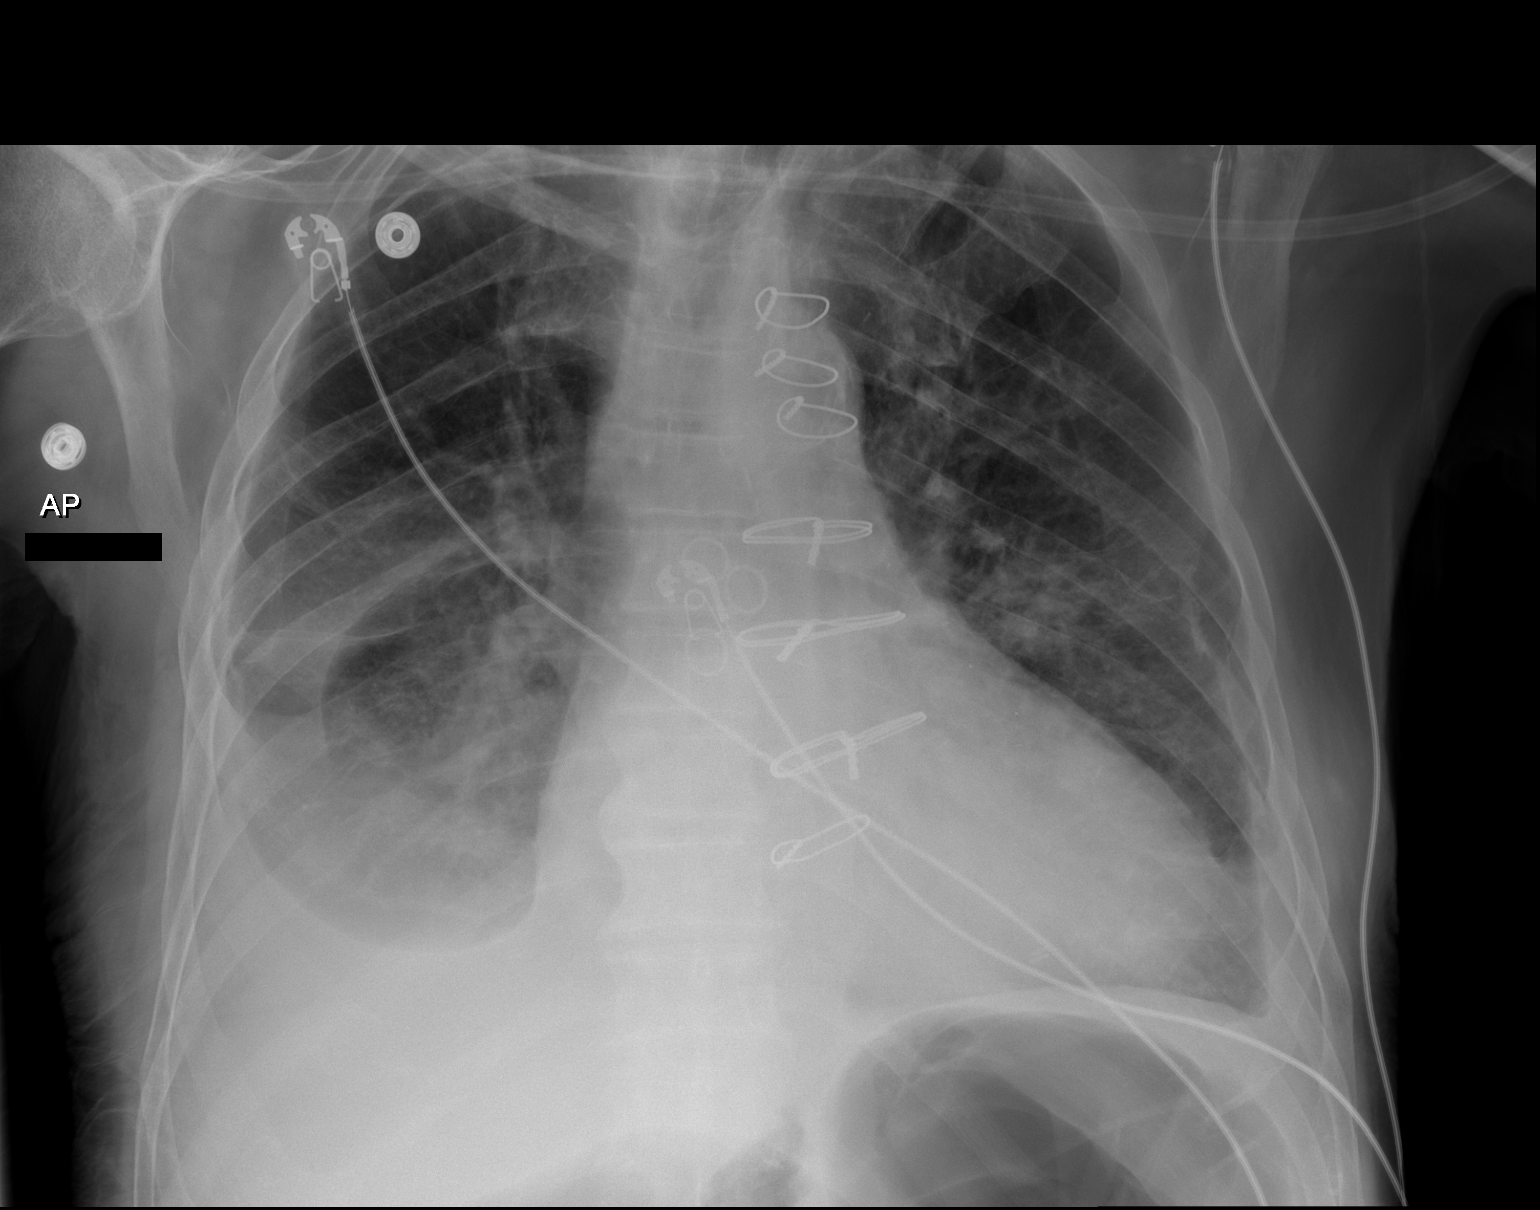

[2 of 2 positions shown; findings below may reference images not displayed]

FINDINGS: There has been mild interval improvement in pulmonary edema, with
persistent small to moderate right and small left pleural effusions.
Mild central airspace opacities are seen. No pneumothorax is
identified.

The mediastinal silhouette is borderline enlarged. The patient is
status post median sternotomy, with evidence of prior CABG. No acute
osseous abnormalities are seen.
IMPRESSION: Mild interval improvement in pulmonary edema, with persistent small
to moderate right and small left pleural effusions.

## 2015-07-27 IMAGING — DX DG CHEST 1V PORT
1 series · 1 of 1 positions shown · non-contrast
Comparison: September 23, 2014.

CLINICAL DATA: Endotracheal tube present.

EXAM:
PORTABLE CHEST - 1 VIEW

[chest ap]
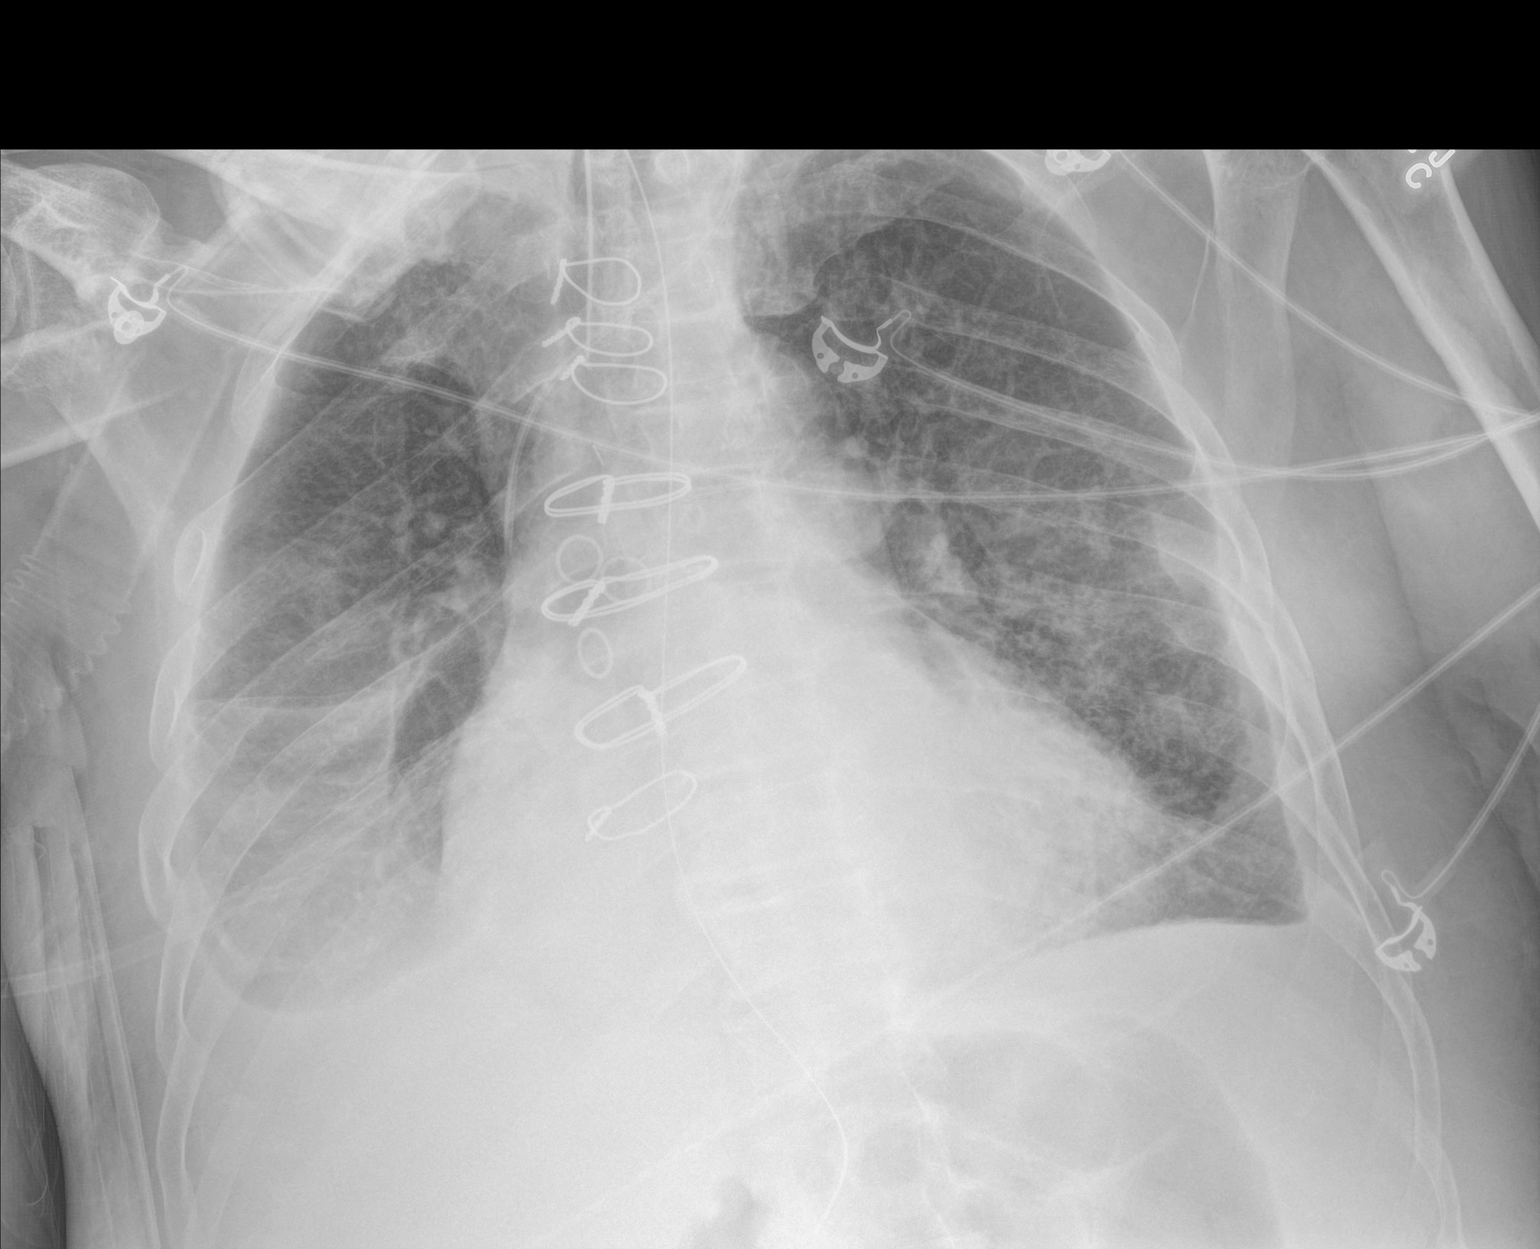

[1 of 1 positions shown; findings below may reference images not displayed]

FINDINGS: Stable cardiomediastinal silhouette. Status post coronary artery
bypass graft. Endotracheal tube is in grossly good position with
distal tip 4 cm above the carina. Left internal jugular catheter is
unchanged with distal tip overlying expected position of the SVC.
Stable bilateral pleural effusions are noted with right greater than
left. Stable bilateral lower lobe opacities are also noted. Bony
thorax is intact.
IMPRESSION: Stable bilateral lung opacities and pleural effusions. Stable
support apparatus.
